# Patient Record
Sex: Female | Born: 1945
Health system: Southern US, Community
[De-identification: ages and names within clinical notes are randomized; demographics above are authoritative.]

## PROBLEM LIST (undated history)

## (undated) DIAGNOSIS — E119 Type 2 diabetes mellitus without complications: Secondary | ICD-10-CM

## (undated) DIAGNOSIS — M353 Polymyalgia rheumatica: Secondary | ICD-10-CM

## (undated) DIAGNOSIS — I2510 Atherosclerotic heart disease of native coronary artery without angina pectoris: Secondary | ICD-10-CM

## (undated) DIAGNOSIS — I771 Stricture of artery: Secondary | ICD-10-CM

## (undated) DIAGNOSIS — M1990 Unspecified osteoarthritis, unspecified site: Secondary | ICD-10-CM

## (undated) DIAGNOSIS — K219 Gastro-esophageal reflux disease without esophagitis: Secondary | ICD-10-CM

## (undated) DIAGNOSIS — I10 Essential (primary) hypertension: Secondary | ICD-10-CM

## (undated) DIAGNOSIS — E785 Hyperlipidemia, unspecified: Secondary | ICD-10-CM

## (undated) HISTORY — DX: Essential (primary) hypertension: I10

## (undated) HISTORY — PX: JOINT REPLACEMENT: SHX530

## (undated) HISTORY — DX: Atherosclerotic heart disease of native coronary artery without angina pectoris: I25.10

## (undated) HISTORY — DX: Stricture of artery: I77.1

## (undated) HISTORY — PX: REPLACEMENT TOTAL KNEE: SUR1224

## (undated) HISTORY — PX: BREAST SURGERY: SHX581

## (undated) HISTORY — DX: Hyperlipidemia, unspecified: E78.5

## (undated) HISTORY — DX: Polymyalgia rheumatica: M35.3

---

## 1968-11-27 HISTORY — PX: BREAST EXCISIONAL BIOPSY: SUR124

## 1968-11-27 HISTORY — PX: BREAST BIOPSY: SHX20

## 1971-11-28 HISTORY — PX: BREAST BIOPSY: SHX20

## 1971-11-28 HISTORY — PX: BREAST EXCISIONAL BIOPSY: SUR124

## 1981-11-27 HISTORY — PX: VAGINAL HYSTERECTOMY: SUR661

## 2006-09-17 ENCOUNTER — Ambulatory Visit (HOSPITAL_COMMUNITY): Admission: RE | Admit: 2006-09-17 | Discharge: 2006-09-18 | Payer: Self-pay | Admitting: Cardiology

## 2006-09-17 HISTORY — PX: CORONARY ANGIOPLASTY WITH STENT PLACEMENT: SHX49

## 2006-09-27 HISTORY — PX: TRANSTHORACIC ECHOCARDIOGRAM: SHX275

## 2006-10-27 HISTORY — PX: OTHER SURGICAL HISTORY: SHX169

## 2008-08-24 ENCOUNTER — Ambulatory Visit: Payer: Self-pay

## 2008-08-25 ENCOUNTER — Ambulatory Visit (HOSPITAL_COMMUNITY): Admission: RE | Admit: 2008-08-25 | Discharge: 2008-08-25 | Payer: Self-pay | Admitting: Cardiovascular Disease

## 2008-08-25 HISTORY — PX: CARDIAC CATHETERIZATION: SHX172

## 2008-12-17 ENCOUNTER — Ambulatory Visit: Payer: Self-pay | Admitting: Gastroenterology

## 2010-12-29 ENCOUNTER — Ambulatory Visit: Payer: Self-pay | Admitting: Family Medicine

## 2011-03-13 ENCOUNTER — Ambulatory Visit: Payer: Self-pay | Admitting: Family Medicine

## 2011-04-11 NOTE — Cardiovascular Report (Signed)
[name], [name] NO.:  [id number]   MEDICAL RECORD NO.:  [id number]          PATIENT TYPE:  OIB   LOCATION:  2899                         FACILITY:  [facility]   PHYSICIAN:  Laura Rojas, M.D.     DATE OF BIRTH:  February 19, 1946   DATE OF PROCEDURE:  08/25/2008  DATE OF DISCHARGE:  08/25/2008                            CARDIAC CATHETERIZATION   INDICATIONS:  Laura Rojas is a very pleasant 65 year old female who  has known coronary artery disease as well as a history of diabetes  mellitus, hypertension, and hyperlipidemia.  In October 2007, she  underwent stenting of a proximal LAD and also her proximal right  coronary artery.  Initially, she had felt exceptionally well.  Recently,  she has noted some episodes of some mild chest tightness.  A nuclear  stress test was done, which showed a small area of moderate ischemia in  the apical inferolateral segment.  She is now referred for repeat  cardiac catheterization.   PROCEDURE:  After premedication with Valium 5 mg intravenously, the  patient was prepped and draped in usual fashion.  The right femoral  artery was punctured anteriorly and a 5-French sheath was inserted.  Diagnostic cardiac catheterization was done utilizing 5-French FL4  catheter.  With the 5-French right catheter, there was significant  catheter dampening of the pressure and consequently this was changed to  a 4-French no torque and ultimately a 4-French JR4 catheter.  Intracoronary nitroglycerin 200 mcg was also selectively administered  down the right coronary artery to further evaluate potential area of  possible spasm in the mid RCA beyond the stented segment.  The pigtail  catheter was used for biplane cine left ventriculography.  With the  patient's risk factors, distal aortography was also performed.  Hemostasis was obtained by direct manual pressure.  She tolerated the  procedure well.   HEMODYNAMIC DATA:  Central aortic pressure was  130/61.  Left ventricular  pressure was 130/15.   ANGIOGRAPHIC DATA:  Left main coronary artery was an angiographically  normal vessel, which bifurcated into an LAD and left circumflex system.   The proximal LAD had mild luminal irregularity of 20% just prior to the  beginning of the stented segment.  The stented segment in an LV stent  was a long stent that covered the takeoff of 2 diagonal vessels.  There  was diffuse smooth narrowing of the ostial proximal portion of the first  diagonal vessel within the stented segment, but the vessel was small  caliber.  There was no in-stent restenosis.  The apical portion of the  LAD had a 95% stenosis prior to branching into the apical inferior and  inferolateral segment.   The circumflex vessel was a moderate size vessel that gave rise to a  large first marginal vessel and several small distal branches, which  also supplied the posterolateral wall.   The right coronary artery had 40% ostial narrowing.  The stented segment  was widely patent.  Initially, there was 40% narrowing in the mid RCA  beyond the stented segment, which improved to 20% following IC  nitroglycerin.  The RCA supplied a small-to-moderate-sized PDA system  and a very small diminutive PLA system.   Biplane cine left ventriculography revealed normal LV contractility  without focal segmental wall motion abnormalities.  Distal aortography  did not demonstrate any evidence for renal artery stenosis.  There was  no significant aortoiliac disease.   IMPRESSION:  1. Normal left ventricular function.  2. Widely patent stent in the proximal to mid left anterior descending      with 20% narrowing proximal to the stented segment, diffuse 60%      narrowing in the small first diagonal branch, and 95% apical left      anterior descending stenosis.  3. Normal left circumflex coronary artery.  4. Widely patent stent in the right coronary artery with 40% ostial      right coronary  artery narrowing and 30-40% mid right coronary      artery narrowing and slightly improved with intracoronary      nitroglycerin administration.  5. No significant aortoiliac disease.   DISCUSSION:  Laura Rojas's recent nuclear study suggests ischemia in the  apical/very distal inferolateral segment.  Based on her anatomy, this  most likely is due to the 95% apical left anterior descending stenosis  in a very small-caliber vessel.  Increased medical therapy will be  recommended.           ______________________________  Laura Rojas, M.D.     TK/MEDQ  D:  08/25/2008  T:  08/25/2008  Job:  [id number]   cc:   Dani Gobble, MD  Steele Sizer, MD  Elvina Sidle Noralyn Pick, NP

## 2011-04-14 NOTE — Cardiovascular Report (Signed)
NAMEEDITH, GROLEAU                ACCOUNT NO.:  [id number]   MEDICAL RECORD NO.:  [id number]          PATIENT TYPE:  OIB   LOCATION:  6531                         FACILITY:  [facility]   PHYSICIAN:  Vonna Kotyk R. Jacinto Halim, MD       DATE OF BIRTH:  1946/11/12   DATE OF PROCEDURE:  DATE OF DISCHARGE:                              CARDIAC CATHETERIZATION   REFERRING PHYSICIAN:  Leim Fabry, MD   PROCEDURES PERFORMED:  1. PTCA and stenting of right coronary artery.  2. PTCA and stenting of the left anterior descending artery.   INDICATION:  Ms. Myha Arizpe is a 65 year old female with a history of  hypertension and hyperlipidemia who has had an abnormal stress test.  She  has been having chronic angina pectoris.  She underwent diagnostic cardiac  catheterization at the [facility] three days ago and this had  revealed a completely occluded right coronary artery and a high-grade  stenosis of the proximal and mid-LAD.  During this, she was brought to  cardiac catheterization lab for angioplasty to her right and also to her  LAD.  The LAD gave collateral to the right coronary artery.  This was a  short segment occlusion.   Angiographic data right coronary artery:  Right coronary artery is a large  caliber vessel proximally.  The ostium has a 40% stenosis.  Mid-segment is  completely occluded.  The distal RCA is supplied by collaterals from the LAD  and slowly fills up all the way back up to the occlusion site.   Intervention data:  Successful PTCA and stenting of the mid-segment of the  right coronary artery with a 3.0 x 28 mm CYPHER-deployed 18 atmosphere  pressure.  Overall, the stenosis has reduced from 100% to -10%.  Brisk TIMI-  3 flow was noted to the right coronary artery.  The ostial 40-to-50%  stenosis was left alone, as this had not significantly changed from pre-  angioplasty.   Left main:  Left main is a large caliber vessel.  It is smooth and normal.   Circumflex:   Circumflex is a large caliber vessel and is normal.   LAD:  LAD is a large caliber vessel, it gives rise to large diagonal one and  a moderate sized D2. There is a long segment 80-to-90% stenosis in the  proximal segment and a 90% calcific stenosis in the mid-segment of the LAD.   Intervention data:  Successful PTCA and stenting of the LAD with a 2.5 x 33  mm CYPHER deployed at 12 atmosphere pressure.  This stent was post dilated  with a 3.5 x 15 mm Quantum at 12 atmosphere pressure x3.  Transiently,  hyperinflation at 18 atmosphere pressure was performed to break the waste in  the proximal LAD.  Overall, the stenosis was reduced from 90% to 0% with  TIMI-3 to TMI-3 flow maintained at the end the procedure.   A total of 325 cc of contrast was utilized for interventional procedure.   RECOMMENDATIONS:  The patient will  be continued on long-term Plavix.  She  needs continued risk  factor modification.  If stable, she will be discharged  home in the morning with followup with Dr.Aldridge at [facility] office.   A double-guide catheter technique was utilized engaging the left main and  also the right coronary artery with a FL-4 and FR-4 guide.  Simultaneous  injections were performed to utilize the right coronary artery.   Technique of the procedure:  Under usual sterile precautions using a 7-  French right femoral and a 7-French left femoral arterial access and using  heparin and integrellin  for anticoagulation, an FR-4 guide with side hole  was utilized to engage right coronary artery.  Angiography was performed.  Then using as a 7-French Judkins leftward for guide catheter, the left main  coronary artery was selected and angiography is repeated.  Then using  initially a cross it 200 guidewire and then a Whisper wire and eventually a  Miracle Brothers-12 guidewire, I was able to cross the occluded right  coronary artery with the help of 2.0 x 15 mm Maverick balloon support.  Once  the  guidewire was positioned in place because of inability to advance the  2.0 balloon, a 1.5 x 15 mm old wire Maverick balloon was utilized and  multiple inflations at the occluded segment was performed at 10 and 12  atmosphere pressure from 45 to 50 seconds.  Then a 2.0 x 15 mm Maverick  balloon was advanced to the occluded segment and balloon angioplasty was  performed at 12 atmosphere pressure for 60 seconds.  During the procedure,  200 mcg of  intracoronary nitroglycerin was administered.  Patient was  extremely sensitive, immediately dropped her pressures down to 60 to 70 mm  systolic.  Hence, dopamine had to be used very transiently to bring up the  pressures along with administration of 1 mg of atropine.   Once I opened the right coronary artery, a huge vessel was visualized.  Then  I used a 3.0 x 28 mm CYPHER stent and carefully advancing the CYPHER stent  at the occluded segment and positioning the CYPHER stent into proximal  segment of right coronary artery which also had lesions, the stent was  deployed at 69.6 pressure for 60 seconds and then at 18 atmosphere pressure  for 33 seconds.  Post stent deployment, there was outflow spasm noted and  this was gently relieved with advancing the same stent balloon and inflating  it at two atmosphere pressure for 60 seconds.  Again, I noted a distal spasm  which was then relieved with using a 2.0 x 15 mm Maverick at four atmosphere  pressure for 36 seconds.  100 mcg of intracoronary nitroglycerin was  administered.  Overall, angiographic success was confirmed in multiple  views.  Then the guidewire was withdrawn, angiography repeated.  Guide  catheter disengaged and pulled out of body in usual fashion.  During the  procedure, left coronary injections were performed simultaneously to  visualize the distal right coronary artery.   Technique of intervention to the LAD:  Then I used 190 cm x 0.014 inch ATW guidewire to advance into the LAD.   The lesion length was carefully  measured.  I initially used a 3.0 x 28 mm CYPHER stent balloon to advance  into the LAD, but because of inability to do so, I used a 3.0 x 30 mm  Maverick and the LAD angioplasty was performed at 12 and 10 atmosphere  pressure through the mid and proximal segment.  There was significant waste  noted in  the proximal segment which I was unable to open up even for  transiently higher pressure inflations.  At this time, I pulled the balloon  out and a 3.5 x 33 mm CYPHER stent was advanced into the LAD.  With great  difficulty and using a 300 cm x 0.014 inch Cougar wire for a buddy wire  system, the guide was deep throated and the stent was then advanced to the  site of the stenosis.  After confirming the position of the stent, the stent  was deployed at 10 atmosphere pressure for 37 seconds and 8 atmosphere  pressure for 45 seconds.  Then the stents were withdrawn out of the body and  a 3.5 x 15 mm Quantum balloon was advanced into the LAD and keeping the  balloon within the stent struts, a 12 atmosphere pressure x3 inflations were  performed throughout the entirety of the stent.  The mid-LAD had a very  tight waste and that was relieved with transient inflation of the balloon to  around 18 atmosphere pressure.  Post balloon angioplasty, 200 mcg of  intracoronary nitroglycerin was administered, angiography was  repeated.  Excellent results were noted.  After confirming the success of  the results, the guidewire was withdrawn, angiography repeated, guide  catheter pulled out.  Overall, patient tolerated the procedure.  No  immediate complications were noted.      Cristy Hilts. Jacinto Halim, MD  Electronically Signed     JRG/MEDQ  D:  09/17/2006  T:  09/17/2006  Job:  [id number]

## 2011-08-28 LAB — GLUCOSE, CAPILLARY: Glucose-Capillary: 207 — ABNORMAL HIGH

## 2011-11-28 HISTORY — PX: NM MYOCAR MULTIPLE W/SPECT: HXRAD626

## 2011-11-29 DIAGNOSIS — Z9861 Coronary angioplasty status: Secondary | ICD-10-CM | POA: Diagnosis not present

## 2011-11-29 DIAGNOSIS — I2510 Atherosclerotic heart disease of native coronary artery without angina pectoris: Secondary | ICD-10-CM | POA: Diagnosis not present

## 2011-11-29 DIAGNOSIS — I10 Essential (primary) hypertension: Secondary | ICD-10-CM | POA: Diagnosis not present

## 2011-11-29 DIAGNOSIS — R9431 Abnormal electrocardiogram [ECG] [EKG]: Secondary | ICD-10-CM | POA: Diagnosis not present

## 2011-12-13 DIAGNOSIS — H4010X0 Unspecified open-angle glaucoma, stage unspecified: Secondary | ICD-10-CM | POA: Diagnosis not present

## 2012-02-05 DIAGNOSIS — M25569 Pain in unspecified knee: Secondary | ICD-10-CM | POA: Diagnosis not present

## 2012-02-05 DIAGNOSIS — M1710 Unilateral primary osteoarthritis, unspecified knee: Secondary | ICD-10-CM | POA: Diagnosis not present

## 2012-02-28 DIAGNOSIS — E119 Type 2 diabetes mellitus without complications: Secondary | ICD-10-CM | POA: Diagnosis not present

## 2012-04-10 DIAGNOSIS — E785 Hyperlipidemia, unspecified: Secondary | ICD-10-CM | POA: Diagnosis not present

## 2012-04-10 DIAGNOSIS — Z0000 Encounter for general adult medical examination without abnormal findings: Secondary | ICD-10-CM | POA: Diagnosis not present

## 2012-04-10 DIAGNOSIS — F329 Major depressive disorder, single episode, unspecified: Secondary | ICD-10-CM | POA: Diagnosis not present

## 2012-04-10 DIAGNOSIS — Z1289 Encounter for screening for malignant neoplasm of other sites: Secondary | ICD-10-CM | POA: Diagnosis not present

## 2012-04-10 DIAGNOSIS — E119 Type 2 diabetes mellitus without complications: Secondary | ICD-10-CM | POA: Diagnosis not present

## 2012-04-10 DIAGNOSIS — M949 Disorder of cartilage, unspecified: Secondary | ICD-10-CM | POA: Diagnosis not present

## 2012-04-10 DIAGNOSIS — I10 Essential (primary) hypertension: Secondary | ICD-10-CM | POA: Diagnosis not present

## 2012-05-20 ENCOUNTER — Ambulatory Visit: Payer: Self-pay | Admitting: Family Medicine

## 2012-05-20 DIAGNOSIS — Z1231 Encounter for screening mammogram for malignant neoplasm of breast: Secondary | ICD-10-CM | POA: Diagnosis not present

## 2012-06-12 DIAGNOSIS — H4010X0 Unspecified open-angle glaucoma, stage unspecified: Secondary | ICD-10-CM | POA: Diagnosis not present

## 2012-06-25 DIAGNOSIS — I10 Essential (primary) hypertension: Secondary | ICD-10-CM | POA: Diagnosis not present

## 2012-06-25 DIAGNOSIS — Z794 Long term (current) use of insulin: Secondary | ICD-10-CM | POA: Diagnosis not present

## 2012-06-25 DIAGNOSIS — E7800 Pure hypercholesterolemia, unspecified: Secondary | ICD-10-CM | POA: Diagnosis not present

## 2012-07-03 DIAGNOSIS — R319 Hematuria, unspecified: Secondary | ICD-10-CM | POA: Diagnosis not present

## 2012-07-03 DIAGNOSIS — E119 Type 2 diabetes mellitus without complications: Secondary | ICD-10-CM | POA: Diagnosis not present

## 2012-07-05 DIAGNOSIS — N390 Urinary tract infection, site not specified: Secondary | ICD-10-CM | POA: Diagnosis not present

## 2012-07-05 DIAGNOSIS — R319 Hematuria, unspecified: Secondary | ICD-10-CM | POA: Diagnosis not present

## 2012-07-10 DIAGNOSIS — R319 Hematuria, unspecified: Secondary | ICD-10-CM | POA: Diagnosis not present

## 2012-07-10 DIAGNOSIS — I10 Essential (primary) hypertension: Secondary | ICD-10-CM | POA: Diagnosis not present

## 2012-09-18 DIAGNOSIS — Z23 Encounter for immunization: Secondary | ICD-10-CM | POA: Diagnosis not present

## 2012-10-14 DIAGNOSIS — E119 Type 2 diabetes mellitus without complications: Secondary | ICD-10-CM | POA: Diagnosis not present

## 2012-10-14 DIAGNOSIS — E785 Hyperlipidemia, unspecified: Secondary | ICD-10-CM | POA: Diagnosis not present

## 2012-10-14 DIAGNOSIS — I10 Essential (primary) hypertension: Secondary | ICD-10-CM | POA: Diagnosis not present

## 2012-12-10 DIAGNOSIS — I10 Essential (primary) hypertension: Secondary | ICD-10-CM | POA: Diagnosis not present

## 2012-12-10 DIAGNOSIS — E7800 Pure hypercholesterolemia, unspecified: Secondary | ICD-10-CM | POA: Diagnosis not present

## 2012-12-11 DIAGNOSIS — H4010X0 Unspecified open-angle glaucoma, stage unspecified: Secondary | ICD-10-CM | POA: Diagnosis not present

## 2012-12-12 DIAGNOSIS — E119 Type 2 diabetes mellitus without complications: Secondary | ICD-10-CM | POA: Diagnosis not present

## 2012-12-12 DIAGNOSIS — I10 Essential (primary) hypertension: Secondary | ICD-10-CM | POA: Diagnosis not present

## 2012-12-12 DIAGNOSIS — I2510 Atherosclerotic heart disease of native coronary artery without angina pectoris: Secondary | ICD-10-CM | POA: Diagnosis not present

## 2013-01-21 DIAGNOSIS — E162 Hypoglycemia, unspecified: Secondary | ICD-10-CM | POA: Diagnosis not present

## 2013-01-21 DIAGNOSIS — Z794 Long term (current) use of insulin: Secondary | ICD-10-CM | POA: Diagnosis not present

## 2013-02-10 DIAGNOSIS — L259 Unspecified contact dermatitis, unspecified cause: Secondary | ICD-10-CM | POA: Diagnosis not present

## 2013-02-10 DIAGNOSIS — I10 Essential (primary) hypertension: Secondary | ICD-10-CM | POA: Diagnosis not present

## 2013-04-16 DIAGNOSIS — E785 Hyperlipidemia, unspecified: Secondary | ICD-10-CM | POA: Diagnosis not present

## 2013-04-16 DIAGNOSIS — E119 Type 2 diabetes mellitus without complications: Secondary | ICD-10-CM | POA: Diagnosis not present

## 2013-04-16 DIAGNOSIS — Z0000 Encounter for general adult medical examination without abnormal findings: Secondary | ICD-10-CM | POA: Diagnosis not present

## 2013-04-16 DIAGNOSIS — I2510 Atherosclerotic heart disease of native coronary artery without angina pectoris: Secondary | ICD-10-CM | POA: Diagnosis not present

## 2013-04-16 DIAGNOSIS — I10 Essential (primary) hypertension: Secondary | ICD-10-CM | POA: Diagnosis not present

## 2013-05-21 ENCOUNTER — Ambulatory Visit: Payer: Self-pay | Admitting: Family Medicine

## 2013-05-21 DIAGNOSIS — Z1231 Encounter for screening mammogram for malignant neoplasm of breast: Secondary | ICD-10-CM | POA: Diagnosis not present

## 2013-06-11 DIAGNOSIS — H4010X0 Unspecified open-angle glaucoma, stage unspecified: Secondary | ICD-10-CM | POA: Diagnosis not present

## 2013-07-01 DIAGNOSIS — E7800 Pure hypercholesterolemia, unspecified: Secondary | ICD-10-CM | POA: Diagnosis not present

## 2013-07-01 DIAGNOSIS — I10 Essential (primary) hypertension: Secondary | ICD-10-CM | POA: Diagnosis not present

## 2013-09-08 DIAGNOSIS — Z23 Encounter for immunization: Secondary | ICD-10-CM | POA: Diagnosis not present

## 2013-10-16 DIAGNOSIS — E785 Hyperlipidemia, unspecified: Secondary | ICD-10-CM | POA: Diagnosis not present

## 2013-10-16 DIAGNOSIS — I10 Essential (primary) hypertension: Secondary | ICD-10-CM | POA: Diagnosis not present

## 2013-10-16 DIAGNOSIS — E119 Type 2 diabetes mellitus without complications: Secondary | ICD-10-CM | POA: Diagnosis not present

## 2013-12-17 DIAGNOSIS — H4010X0 Unspecified open-angle glaucoma, stage unspecified: Secondary | ICD-10-CM | POA: Diagnosis not present

## 2013-12-22 DIAGNOSIS — IMO0001 Reserved for inherently not codable concepts without codable children: Secondary | ICD-10-CM | POA: Diagnosis not present

## 2013-12-30 DIAGNOSIS — IMO0001 Reserved for inherently not codable concepts without codable children: Secondary | ICD-10-CM | POA: Diagnosis not present

## 2013-12-30 DIAGNOSIS — I10 Essential (primary) hypertension: Secondary | ICD-10-CM | POA: Diagnosis not present

## 2013-12-30 DIAGNOSIS — E7800 Pure hypercholesterolemia, unspecified: Secondary | ICD-10-CM | POA: Diagnosis not present

## 2013-12-31 ENCOUNTER — Encounter: Payer: Self-pay | Admitting: Cardiovascular Disease

## 2013-12-31 ENCOUNTER — Ambulatory Visit (INDEPENDENT_AMBULATORY_CARE_PROVIDER_SITE_OTHER): Payer: Medicare Other | Admitting: Cardiovascular Disease

## 2013-12-31 VITALS — BP 110/64 | HR 54 | Resp 16 | Ht 65.0 in | Wt 164.0 lb

## 2013-12-31 DIAGNOSIS — E785 Hyperlipidemia, unspecified: Secondary | ICD-10-CM | POA: Diagnosis not present

## 2013-12-31 DIAGNOSIS — E1159 Type 2 diabetes mellitus with other circulatory complications: Secondary | ICD-10-CM | POA: Insufficient documentation

## 2013-12-31 DIAGNOSIS — I10 Essential (primary) hypertension: Secondary | ICD-10-CM | POA: Insufficient documentation

## 2013-12-31 DIAGNOSIS — E118 Type 2 diabetes mellitus with unspecified complications: Secondary | ICD-10-CM | POA: Insufficient documentation

## 2013-12-31 DIAGNOSIS — I2510 Atherosclerotic heart disease of native coronary artery without angina pectoris: Secondary | ICD-10-CM | POA: Diagnosis not present

## 2013-12-31 DIAGNOSIS — E1169 Type 2 diabetes mellitus with other specified complication: Secondary | ICD-10-CM | POA: Insufficient documentation

## 2013-12-31 HISTORY — DX: Atherosclerotic heart disease of native coronary artery without angina pectoris: I25.10

## 2013-12-31 MED ORDER — HYDROCHLOROTHIAZIDE 12.5 MG PO CAPS: 12.5000 mg | ORAL_CAPSULE | Freq: Every day | ORAL | Status: DC

## 2013-12-31 NOTE — Progress Notes (Signed)
Patient ID: Analise Domin, female   DOB: 19-Dec-1945, 68 y.o.   MRN: [id number]      Reason for office visit Followup CAD  Mrs. Merrilee Jansky is a 68 year old woman with coronary disease and previous percutaneous revascularization. She had stents placed in the proximal LAD and proximal RCA (2007, 3.0 x 28 mm drug-eluting Cypher stent to RCA, 3.5 x 33 mm drug-eluting Cypher stent and proximal LAD). She is known to have an untreated 25% stenosis in the very distal portion of the LAD and a 60% "jailed" ostial diagonal stenosis at the level of the proximal LAD stent. She has preserved LV systolic function and has never had congestive heart failure. Her most recent functional study was a nuclear stress test in 2013 that showed normal perfusion and normal LVEF. Her most recent cardiac catheterization was performed in 2009 in both the LAD and RCA stents were found to be widely patent.  Significant comorbidities include diabetes mellitus type 2, hyperlipidemia and hypertension although aggressive pharmacological therapy.  She is feeling better than last year. At that time she was recovering from several deaths that occurred in her immediate family. She is now taking care of both of her own household and that over 2 year old father who wants to continue living independently. She is not limited by dyspnea or angina. Denies syncope but does have frequent dizziness when she bends over and stands up. Her last hemoglobin A1c was suboptimal at 8.2%. Her endocrinologist is Dr. Renae Fickle in [place]. Her most recent lipid profile was quite favorable. Her blood pressure control is excellent.   Allergies  Allergen Reactions  . Compazine [Prochlorperazine Edisylate]     choking  . Reglan [Metoclopramide]   . Sulfa Antibiotics     headache  . Latex Rash    Current Outpatient Prescriptions  Medication Sig Dispense Refill  . aspirin 81 MG tablet Take 81 mg by mouth daily.      . carvedilol (COREG) 25 MG tablet Take 12.5  mg by mouth 2 (two) times daily.      . clopidogrel (PLAVIX) 75 MG tablet Take 75 mg by mouth daily.      . insulin aspart (NOVOLOG) 100 UNIT/ML injection Inject 6 Units into the skin 3 (three) times daily before meals.      Marland Kitchen LANTUS 100 UNIT/ML injection Inject 18 Units into the skin every morning.      . latanoprost (XALATAN) 0.005 % ophthalmic solution Place 1 drop into both eyes daily.      Marland Kitchen LORazepam (ATIVAN) 1 MG tablet Take 1 mg by mouth daily.      Marland Kitchen losartan (COZAAR) 100 MG tablet Take 100 mg by mouth daily.      . magnesium 30 MG tablet Take 30 mg by mouth daily.      . metFORMIN (GLUCOPHAGE) 1000 MG tablet Take 1,000 mg by mouth 2 (two) times daily.      . Omega-3 Fatty Acids (FISH OIL) 1200 MG CAPS Take 1,200 mg by mouth daily.      . pantoprazole (PROTONIX) 40 MG tablet Take 40 mg by mouth daily.      Marland Kitchen PARoxetine (PAXIL) 20 MG tablet Take 20 mg by mouth daily.      . simvastatin (ZOCOR) 40 MG tablet Take 40 mg by mouth daily.      . hydrochlorothiazide (MICROZIDE) 12.5 MG capsule Take 1 capsule (12.5 mg total) by mouth daily.  90 capsule  3   No current facility-administered medications for this visit.  Past Medical History  Diagnosis Date  . CAD (coronary artery disease)   . DM (diabetes mellitus)   . Dyslipidemia   . Hypertension     Past Surgical History  Procedure Laterality Date  . Replacement total knee  2003 & 2004  . Vaginal hysterectomy  1983  . Breast biopsy  1970    right  . Breast biopsy  1973    left  . Coronary angioplasty with stent placement  09/17/2006    PTCA & stenting LAD  . Cardiac catheterization  08/25/2008    patent stents    Family History  Problem Relation Age of Onset  . Cancer Mother   . Diabetes Sister   . Cancer Sister     History   Social History  . Marital Status: Married    Spouse Name: N/A    Number of Children: N/A  . Years of Education: N/A   Occupational History  . Not on file.   Social History Main Topics    . Smoking status: Never Smoker   . Smokeless tobacco: Not on file  . Alcohol Use: No  . Drug Use: No  . Sexual Activity: Not on file   Other Topics Concern  . Not on file   Social History Narrative  . No narrative on file    Review of systems: The patient specifically denies any chest pain at rest or with exertion, dyspnea at rest or with exertion, orthopnea, paroxysmal nocturnal dyspnea, syncope, palpitations, focal neurological deficits, intermittent claudication, lower extremity edema, unexplained weight gain, cough, hemoptysis or wheezing.  The patient also denies abdominal pain, nausea, vomiting, dysphagia, diarrhea, constipation, polyuria, polydipsia, dysuria, hematuria, frequency, urgency, abnormal bleeding or bruising, fever, chills, unexpected weight changes, mood swings, change in skin or hair texture, change in voice quality, auditory or visual problems, allergic reactions or rashes, new musculoskeletal complaints other than usual "aches and pains". She describes orthostatic dizziness  PHYSICAL EXAM BP 110/64  Pulse 54  Resp 16  Ht 5\' 5"  (1.651 m)  Wt 74.39 kg (164 lb)  BMI 27.29 kg/m2  General: Alert, oriented x3, no distress Head: no evidence of trauma, PERRL, EOMI, no exophtalmos or lid lag, no myxedema, no xanthelasma; normal ears, nose and oropharynx Neck: normal jugular venous pulsations and no hepatojugular reflux; brisk carotid pulses without delay and no carotid bruits Chest: clear to auscultation, no signs of consolidation by percussion or palpation, normal fremitus, symmetrical and full respiratory excursions Cardiovascular: normal position and quality of the apical impulse, regular rhythm, normal first and second heart sounds, no murmurs, rubs or gallops Abdomen: no tenderness or distention, no masses by palpation, no abnormal pulsatility or arterial bruits, normal bowel sounds, no hepatosplenomegaly Extremities: no clubbing, cyanosis or edema; 2+ radial,  ulnar and brachial pulses bilaterally; 2+ right femoral, posterior tibial and dorsalis pedis pulses; 2+ left femoral, posterior tibial and dorsalis pedis pulses; no subclavian or femoral bruits Neurological: grossly nonfocal   EKG: Sinus bradycardia, chronic ST segment depression and inverted T waves across the entire anterior precordium as well as in the inferior limb leads. This is not changed from previous tracings. QTC 424 ms  Lipid Panel labs were performed in November 2014 Total cholesterol 135, HDL 40, triglycerides 207, LDL 54 Creatinine 1.1 (GFR 52) hemoglobin 12.2, normal liver function tests, normal TSH    ASSESSMENT AND PLAN  It has been 8 years since Mrs. Fowler underwent percutaneous revascularization of 2 coronary vessels and she has not had adverse coronary  events since. Both her stents are drug-eluting devices. With the exception of her diabetes, her risk factors are well controlled. Her hemoglobin A1c has actually improved from last year. The lack of ideal glycemic control is also likely responsible for her elevated triglycerides. Her blood pressure control may be too good. I have recommended that she decrease her hydrochlorothiazide to 12.5 mg daily as this may help with her orthostatic dizziness. She'll followup in a year  Orders Placed This Encounter  Procedures  . EKG 12-Lead   Meds ordered this encounter  Medications  . carvedilol (COREG) 25 MG tablet    Sig: Take 12.5 mg by mouth 2 (two) times daily.  . clopidogrel (PLAVIX) 75 MG tablet    Sig: Take 75 mg by mouth daily.  Marland Kitchen DISCONTD: hydrochlorothiazide (HYDRODIURIL) 25 MG tablet    Sig: Take 25 mg by mouth daily.  Marland Kitchen LANTUS 100 UNIT/ML injection    Sig: Inject 18 Units into the skin every morning.  . latanoprost (XALATAN) 0.005 % ophthalmic solution    Sig: Place 1 drop into both eyes daily.  Marland Kitchen LORazepam (ATIVAN) 1 MG tablet    Sig: Take 1 mg by mouth daily.  Marland Kitchen losartan (COZAAR) 100 MG tablet    Sig: Take 100  mg by mouth daily.  . metFORMIN (GLUCOPHAGE) 1000 MG tablet    Sig: Take 1,000 mg by mouth 2 (two) times daily.  . pantoprazole (PROTONIX) 40 MG tablet    Sig: Take 40 mg by mouth daily.  . simvastatin (ZOCOR) 40 MG tablet    Sig: Take 40 mg by mouth daily.  Marland Kitchen PARoxetine (PAXIL) 20 MG tablet    Sig: Take 20 mg by mouth daily.  . insulin aspart (NOVOLOG) 100 UNIT/ML injection    Sig: Inject 6 Units into the skin 3 (three) times daily before meals.  Marland Kitchen aspirin 81 MG tablet    Sig: Take 81 mg by mouth daily.  . Omega-3 Fatty Acids (FISH OIL) 1200 MG CAPS    Sig: Take 1,200 mg by mouth daily.  . magnesium 30 MG tablet    Sig: Take 30 mg by mouth daily.  . hydrochlorothiazide (MICROZIDE) 12.5 MG capsule    Sig: Take 1 capsule (12.5 mg total) by mouth daily.    Dispense:  90 capsule    Refill:  3    Eliyohu Class  Thurmon Fair, MD, [facility] [phone] office [phone] pager

## 2013-12-31 NOTE — Patient Instructions (Signed)
DECREASE HCTZ  (HYDROCHLORATHIZIDE) TO 12.5 MG ONCE A DAY  Your physician wants you to follow-up in [place]. You will receive a reminder letter in the mail two months in advance. If you don't receive a letter, please call our office to schedule the follow-up appointment.

## 2014-03-11 DIAGNOSIS — M259 Joint disorder, unspecified: Secondary | ICD-10-CM | POA: Diagnosis not present

## 2014-03-11 DIAGNOSIS — M109 Gout, unspecified: Secondary | ICD-10-CM | POA: Diagnosis not present

## 2014-04-06 DIAGNOSIS — N039 Chronic nephritic syndrome with unspecified morphologic changes: Secondary | ICD-10-CM | POA: Diagnosis not present

## 2014-04-06 DIAGNOSIS — N183 Chronic kidney disease, stage 3 unspecified: Secondary | ICD-10-CM | POA: Diagnosis not present

## 2014-04-06 DIAGNOSIS — E119 Type 2 diabetes mellitus without complications: Secondary | ICD-10-CM | POA: Diagnosis not present

## 2014-04-06 DIAGNOSIS — N2581 Secondary hyperparathyroidism of renal origin: Secondary | ICD-10-CM | POA: Diagnosis not present

## 2014-04-06 DIAGNOSIS — D631 Anemia in chronic kidney disease: Secondary | ICD-10-CM | POA: Diagnosis not present

## 2014-04-06 DIAGNOSIS — R809 Proteinuria, unspecified: Secondary | ICD-10-CM | POA: Diagnosis not present

## 2014-04-06 DIAGNOSIS — I10 Essential (primary) hypertension: Secondary | ICD-10-CM | POA: Diagnosis not present

## 2014-04-22 DIAGNOSIS — IMO0001 Reserved for inherently not codable concepts without codable children: Secondary | ICD-10-CM | POA: Diagnosis not present

## 2014-04-22 DIAGNOSIS — Z0000 Encounter for general adult medical examination without abnormal findings: Secondary | ICD-10-CM | POA: Diagnosis not present

## 2014-04-22 DIAGNOSIS — E119 Type 2 diabetes mellitus without complications: Secondary | ICD-10-CM | POA: Diagnosis not present

## 2014-04-22 DIAGNOSIS — E785 Hyperlipidemia, unspecified: Secondary | ICD-10-CM | POA: Diagnosis not present

## 2014-04-22 DIAGNOSIS — I10 Essential (primary) hypertension: Secondary | ICD-10-CM | POA: Diagnosis not present

## 2014-04-22 DIAGNOSIS — N183 Chronic kidney disease, stage 3 unspecified: Secondary | ICD-10-CM | POA: Diagnosis not present

## 2014-04-22 DIAGNOSIS — Z23 Encounter for immunization: Secondary | ICD-10-CM | POA: Diagnosis not present

## 2014-04-22 DIAGNOSIS — I2510 Atherosclerotic heart disease of native coronary artery without angina pectoris: Secondary | ICD-10-CM | POA: Diagnosis not present

## 2014-04-23 DIAGNOSIS — N183 Chronic kidney disease, stage 3 unspecified: Secondary | ICD-10-CM | POA: Diagnosis not present

## 2014-04-24 DIAGNOSIS — E1165 Type 2 diabetes mellitus with hyperglycemia: Secondary | ICD-10-CM | POA: Diagnosis not present

## 2014-04-24 DIAGNOSIS — I129 Hypertensive chronic kidney disease with stage 1 through stage 4 chronic kidney disease, or unspecified chronic kidney disease: Secondary | ICD-10-CM | POA: Diagnosis not present

## 2014-04-24 DIAGNOSIS — N183 Chronic kidney disease, stage 3 unspecified: Secondary | ICD-10-CM | POA: Diagnosis not present

## 2014-04-24 DIAGNOSIS — E1129 Type 2 diabetes mellitus with other diabetic kidney complication: Secondary | ICD-10-CM | POA: Diagnosis not present

## 2014-04-28 DIAGNOSIS — IMO0001 Reserved for inherently not codable concepts without codable children: Secondary | ICD-10-CM | POA: Diagnosis not present

## 2014-04-28 DIAGNOSIS — E785 Hyperlipidemia, unspecified: Secondary | ICD-10-CM | POA: Diagnosis not present

## 2014-04-28 DIAGNOSIS — I10 Essential (primary) hypertension: Secondary | ICD-10-CM | POA: Diagnosis not present

## 2014-05-04 DIAGNOSIS — N183 Chronic kidney disease, stage 3 unspecified: Secondary | ICD-10-CM | POA: Diagnosis not present

## 2014-05-04 DIAGNOSIS — N039 Chronic nephritic syndrome with unspecified morphologic changes: Secondary | ICD-10-CM | POA: Diagnosis not present

## 2014-05-04 DIAGNOSIS — I10 Essential (primary) hypertension: Secondary | ICD-10-CM | POA: Diagnosis not present

## 2014-05-04 DIAGNOSIS — E119 Type 2 diabetes mellitus without complications: Secondary | ICD-10-CM | POA: Diagnosis not present

## 2014-05-04 DIAGNOSIS — D631 Anemia in chronic kidney disease: Secondary | ICD-10-CM | POA: Diagnosis not present

## 2014-05-04 DIAGNOSIS — N2581 Secondary hyperparathyroidism of renal origin: Secondary | ICD-10-CM | POA: Diagnosis not present

## 2014-05-13 ENCOUNTER — Ambulatory Visit: Payer: Self-pay | Admitting: Family Medicine

## 2014-05-13 DIAGNOSIS — Z13820 Encounter for screening for osteoporosis: Secondary | ICD-10-CM | POA: Diagnosis not present

## 2014-05-13 DIAGNOSIS — N189 Chronic kidney disease, unspecified: Secondary | ICD-10-CM | POA: Diagnosis not present

## 2014-05-25 ENCOUNTER — Ambulatory Visit: Payer: Self-pay | Admitting: Family Medicine

## 2014-05-25 DIAGNOSIS — Z1231 Encounter for screening mammogram for malignant neoplasm of breast: Secondary | ICD-10-CM | POA: Diagnosis not present

## 2014-06-04 DIAGNOSIS — J029 Acute pharyngitis, unspecified: Secondary | ICD-10-CM | POA: Diagnosis not present

## 2014-06-04 DIAGNOSIS — J209 Acute bronchitis, unspecified: Secondary | ICD-10-CM | POA: Diagnosis not present

## 2014-06-29 DIAGNOSIS — J019 Acute sinusitis, unspecified: Secondary | ICD-10-CM | POA: Diagnosis not present

## 2014-07-22 DIAGNOSIS — E119 Type 2 diabetes mellitus without complications: Secondary | ICD-10-CM | POA: Diagnosis not present

## 2014-07-22 DIAGNOSIS — E538 Deficiency of other specified B group vitamins: Secondary | ICD-10-CM | POA: Diagnosis not present

## 2014-07-22 DIAGNOSIS — IMO0001 Reserved for inherently not codable concepts without codable children: Secondary | ICD-10-CM | POA: Diagnosis not present

## 2014-07-27 DIAGNOSIS — IMO0001 Reserved for inherently not codable concepts without codable children: Secondary | ICD-10-CM | POA: Diagnosis not present

## 2014-07-28 DIAGNOSIS — I10 Essential (primary) hypertension: Secondary | ICD-10-CM | POA: Diagnosis not present

## 2014-07-28 DIAGNOSIS — Z794 Long term (current) use of insulin: Secondary | ICD-10-CM | POA: Diagnosis not present

## 2014-07-28 DIAGNOSIS — E538 Deficiency of other specified B group vitamins: Secondary | ICD-10-CM | POA: Diagnosis not present

## 2014-07-28 DIAGNOSIS — IMO0001 Reserved for inherently not codable concepts without codable children: Secondary | ICD-10-CM | POA: Diagnosis not present

## 2014-09-02 ENCOUNTER — Ambulatory Visit: Payer: Self-pay

## 2014-09-02 DIAGNOSIS — R05 Cough: Secondary | ICD-10-CM | POA: Diagnosis not present

## 2014-09-02 DIAGNOSIS — E119 Type 2 diabetes mellitus without complications: Secondary | ICD-10-CM | POA: Diagnosis not present

## 2014-09-02 DIAGNOSIS — I10 Essential (primary) hypertension: Secondary | ICD-10-CM | POA: Diagnosis not present

## 2014-09-07 DIAGNOSIS — Z23 Encounter for immunization: Secondary | ICD-10-CM | POA: Diagnosis not present

## 2014-09-14 DIAGNOSIS — R05 Cough: Secondary | ICD-10-CM | POA: Diagnosis not present

## 2014-09-14 DIAGNOSIS — J309 Allergic rhinitis, unspecified: Secondary | ICD-10-CM | POA: Diagnosis not present

## 2014-09-21 DIAGNOSIS — R05 Cough: Secondary | ICD-10-CM | POA: Diagnosis not present

## 2014-10-19 DIAGNOSIS — J309 Allergic rhinitis, unspecified: Secondary | ICD-10-CM | POA: Diagnosis not present

## 2014-10-19 DIAGNOSIS — I10 Essential (primary) hypertension: Secondary | ICD-10-CM | POA: Diagnosis not present

## 2014-10-19 DIAGNOSIS — E119 Type 2 diabetes mellitus without complications: Secondary | ICD-10-CM | POA: Diagnosis not present

## 2014-10-19 DIAGNOSIS — E785 Hyperlipidemia, unspecified: Secondary | ICD-10-CM | POA: Diagnosis not present

## 2014-10-19 DIAGNOSIS — I2510 Atherosclerotic heart disease of native coronary artery without angina pectoris: Secondary | ICD-10-CM | POA: Diagnosis not present

## 2014-10-19 DIAGNOSIS — N183 Chronic kidney disease, stage 3 (moderate): Secondary | ICD-10-CM | POA: Diagnosis not present

## 2014-10-19 DIAGNOSIS — M8580 Other specified disorders of bone density and structure, unspecified site: Secondary | ICD-10-CM | POA: Diagnosis not present

## 2014-10-19 DIAGNOSIS — F339 Major depressive disorder, recurrent, unspecified: Secondary | ICD-10-CM | POA: Diagnosis not present

## 2014-10-28 DIAGNOSIS — E1165 Type 2 diabetes mellitus with hyperglycemia: Secondary | ICD-10-CM | POA: Diagnosis not present

## 2014-10-28 DIAGNOSIS — E538 Deficiency of other specified B group vitamins: Secondary | ICD-10-CM | POA: Diagnosis not present

## 2014-11-03 DIAGNOSIS — Z794 Long term (current) use of insulin: Secondary | ICD-10-CM | POA: Diagnosis not present

## 2014-11-03 DIAGNOSIS — E1165 Type 2 diabetes mellitus with hyperglycemia: Secondary | ICD-10-CM | POA: Diagnosis not present

## 2014-11-03 DIAGNOSIS — E538 Deficiency of other specified B group vitamins: Secondary | ICD-10-CM | POA: Diagnosis not present

## 2014-11-03 DIAGNOSIS — I10 Essential (primary) hypertension: Secondary | ICD-10-CM | POA: Diagnosis not present

## 2014-12-21 IMAGING — MG MM DIGITAL SCREENING BILAT W/ CAD
1 series · 5 of 5 positions shown · non-contrast
Comparison: Previous exam(s).

CLINICAL DATA: Screening.

EXAM:
DIGITAL SCREENING BILATERAL MAMMOGRAM WITH CAD

[R CC · right · 5 of 5 slices shown]
[im 1/5]
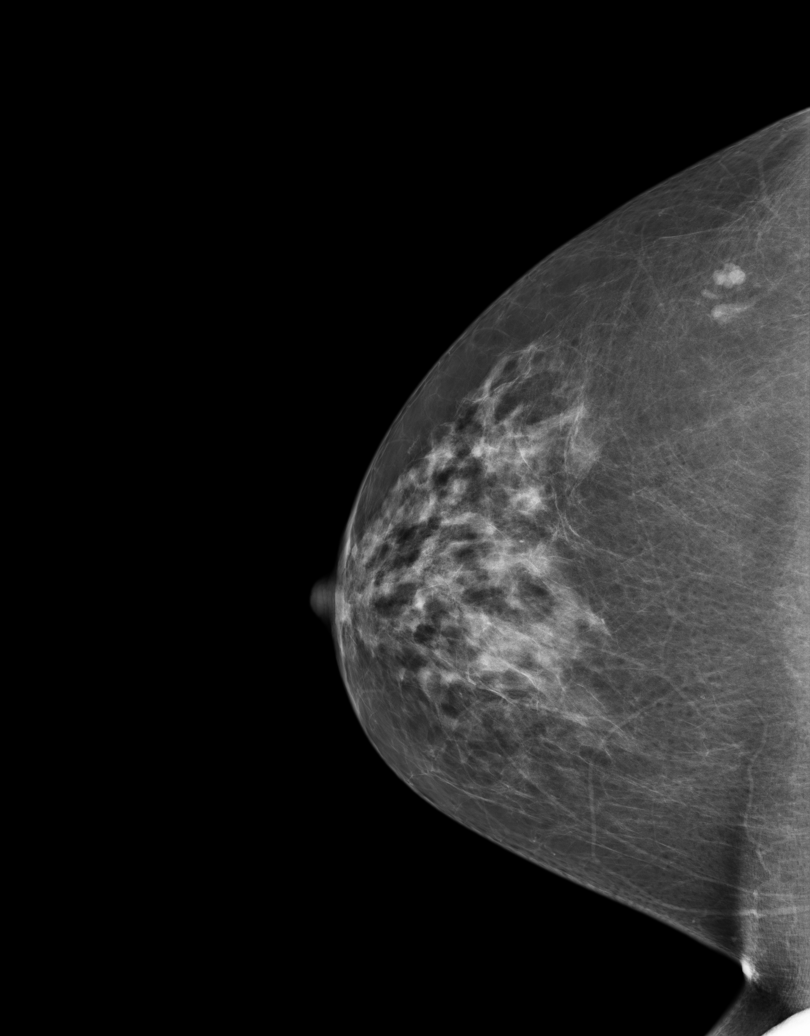
[im 2/5]
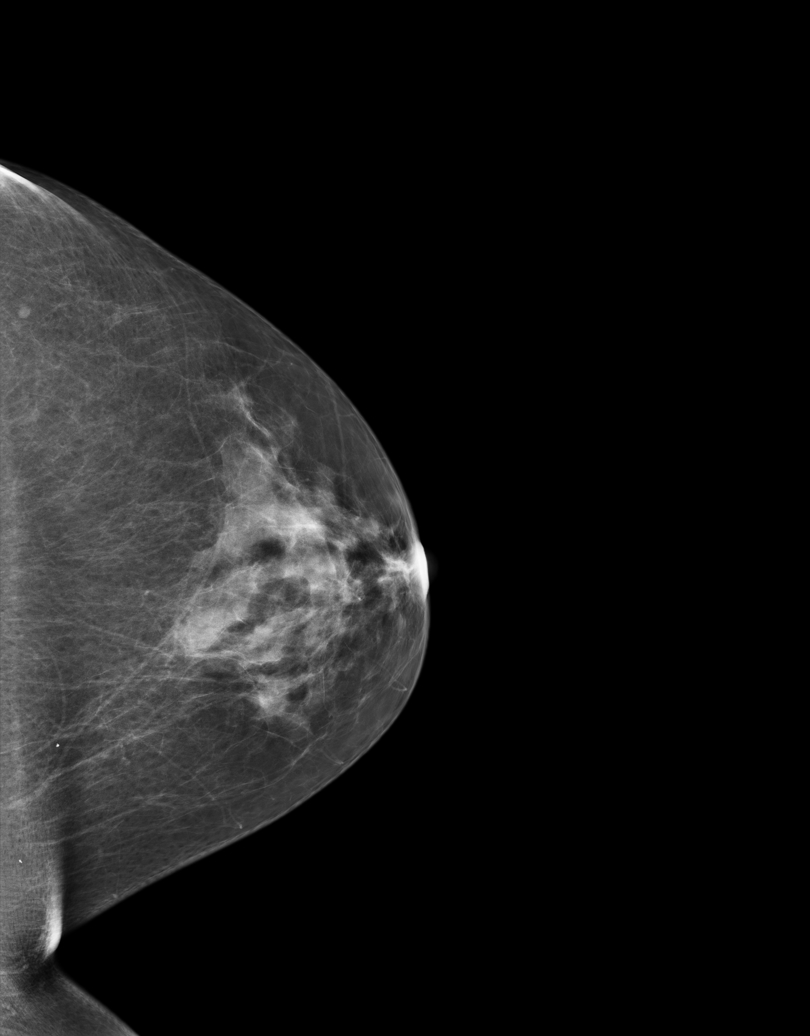
[im 3/5]
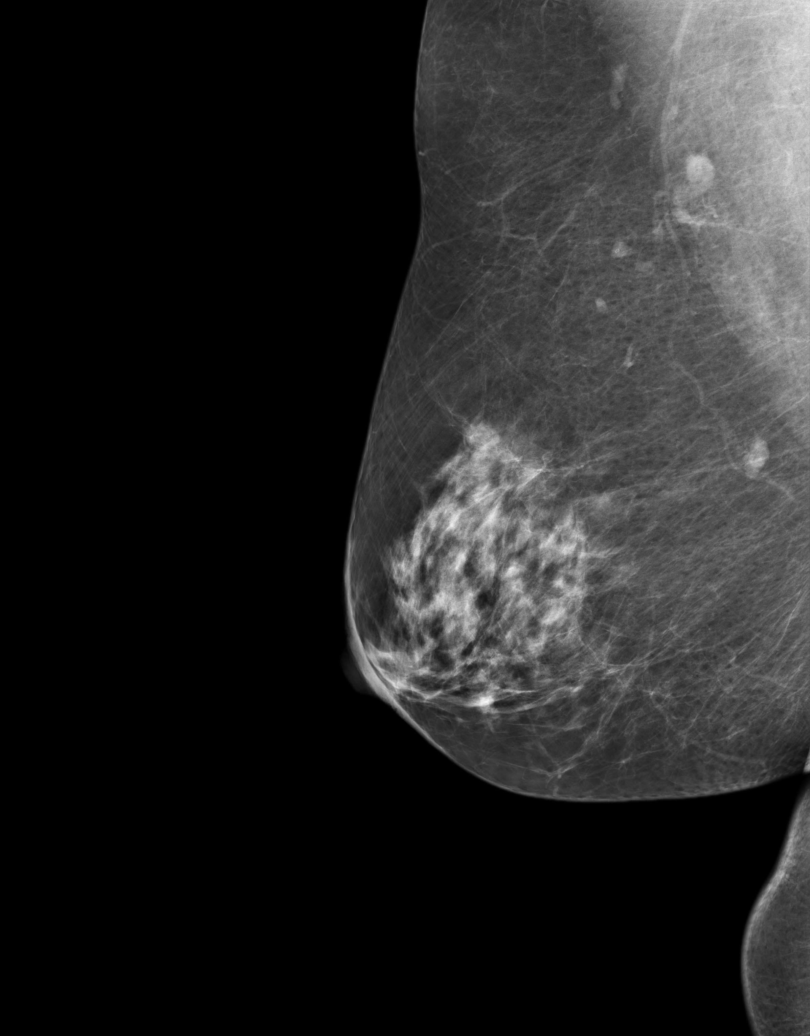
[im 4/5]
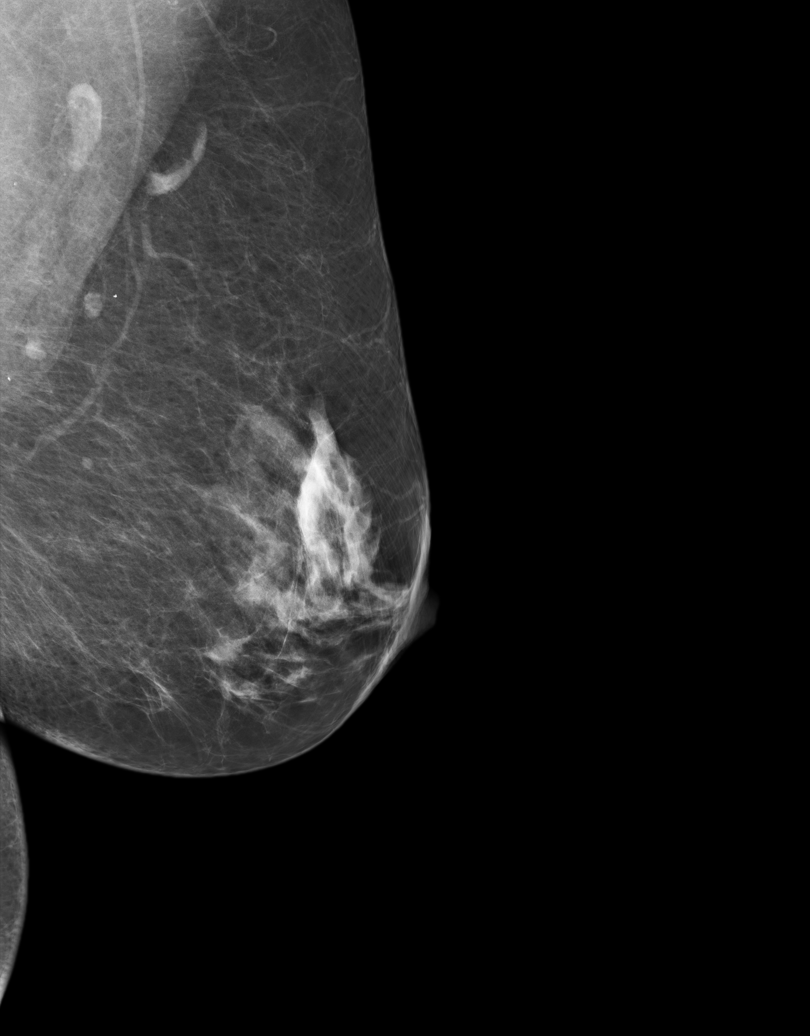
[im 5/5]
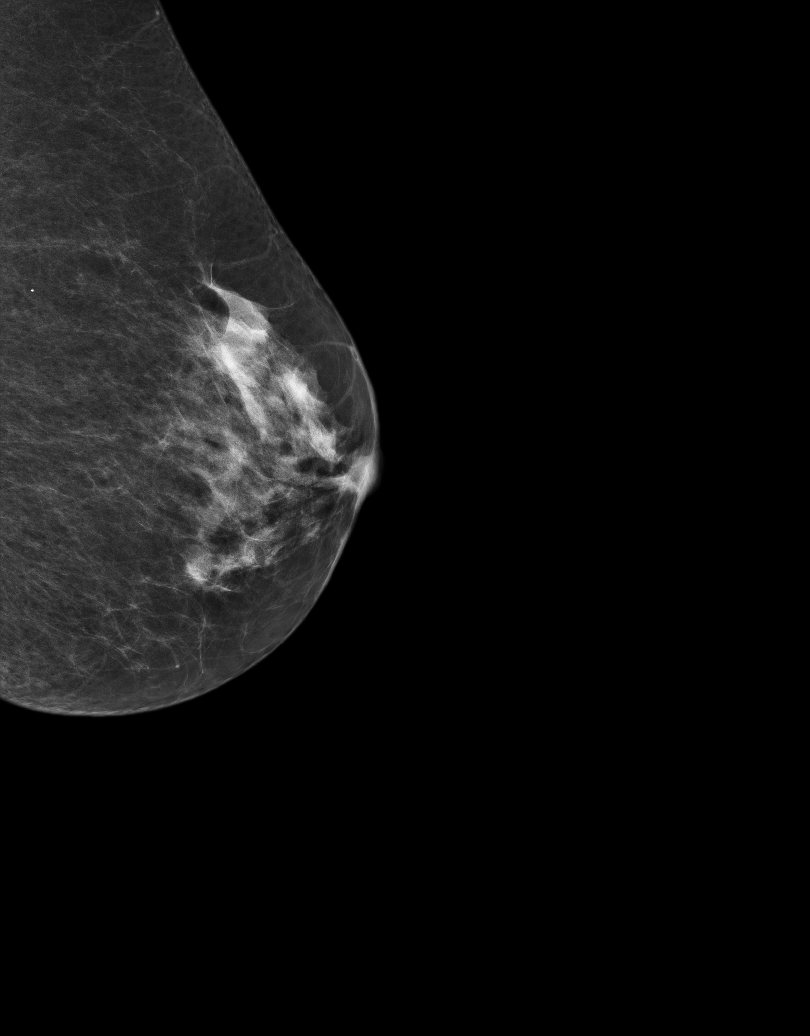

[5 of 5 positions shown; findings below may reference images not displayed]

ACR Breast Density Category b: There are scattered areas of
fibroglandular density.
FINDINGS: There are no findings suspicious for malignancy. Images were
processed with CAD.
IMPRESSION: No mammographic evidence of malignancy. A result letter of this
screening mammogram will be mailed directly to the patient.

RECOMMENDATION:
Screening mammogram in one year. (Code:AS-G-LCT)

BI-RADS CATEGORY  1: Negative.

## 2015-01-01 ENCOUNTER — Encounter: Payer: Self-pay | Admitting: Cardiovascular Disease

## 2015-01-01 ENCOUNTER — Ambulatory Visit (INDEPENDENT_AMBULATORY_CARE_PROVIDER_SITE_OTHER): Payer: Medicare Other | Admitting: Cardiovascular Disease

## 2015-01-01 VITALS — BP 94/58 | HR 54 | Resp 16 | Ht 65.0 in | Wt 170.9 lb

## 2015-01-01 DIAGNOSIS — E785 Hyperlipidemia, unspecified: Secondary | ICD-10-CM | POA: Diagnosis not present

## 2015-01-01 DIAGNOSIS — I771 Stricture of artery: Secondary | ICD-10-CM | POA: Insufficient documentation

## 2015-01-01 DIAGNOSIS — I2510 Atherosclerotic heart disease of native coronary artery without angina pectoris: Secondary | ICD-10-CM | POA: Diagnosis not present

## 2015-01-01 DIAGNOSIS — I10 Essential (primary) hypertension: Secondary | ICD-10-CM | POA: Diagnosis not present

## 2015-01-01 DIAGNOSIS — R0989 Other specified symptoms and signs involving the circulatory and respiratory systems: Secondary | ICD-10-CM | POA: Diagnosis not present

## 2015-01-01 DIAGNOSIS — I708 Atherosclerosis of other arteries: Secondary | ICD-10-CM | POA: Diagnosis not present

## 2015-01-01 HISTORY — DX: Stricture of artery: I77.1

## 2015-01-01 NOTE — Patient Instructions (Signed)
Your physician has requested that you have a carotid duplex with subclavian doppler. This test is an ultrasound of the carotid arteries in your neck. It looks at blood flow through these arteries that supply the brain with blood. Allow one hour for this exam. There are no restrictions or special instructions.  We will call you with the results.   Dr. Sallyanne Kuster recommends that you schedule a follow-up appointment in: One year.

## 2015-01-01 NOTE — Progress Notes (Signed)
Patient ID: Laura Rojas, female   DOB: 1946/04/15, 69 y.o.   MRN: [id number]     Reason for office visit CAD  Laura Rojas is a 69 year old woman with coronary disease and previous percutaneous revascularization. She had stents placed in the proximal LAD and proximal RCA (2007, 3.0 x 28 mm drug-eluting Cypher stent to RCA, 3.5 x 33 mm drug-eluting Cypher stent and proximal LAD). She is known to have an untreated stenosis in the very distal portion of the LAD and a 60% "jailed" ostial diagonal stenosis at the level of the proximal LAD stent. She has preserved LV systolic function and has never had congestive heart failure. Her most recent functional study was a nuclear stress test in 2013 that showed normal perfusion and normal LVEF. Her most recent cardiac catheterization was performed in 2009 and both the LAD and RCA stents were found to be widely patent. Significant comorbidities include diabetes mellitus type 2, hyperlipidemia and hypertension.  She has no cardiovascular complaints today. Her electrocardiogram, as always is abnormal, with mild downsloping ST depression and inverted T waves without the anterior precordium and the inferior leads. Her blood pressure appears to be quite different between the 2 upper extremities in the right arm her blood pressure is 120/78 mmHg in the left arm the systolic blood pressure is 98 mmHg and diastolic heart cough sounds are heard all the way to 0. There are no audible subclavian bruits. She does not have known subclavian artery disease or carotid disease. He does not describe left arm claudication or vertebral steal syndrome symptoms.  Glycemic control remains mediocre, with a hemoglobin A1c greater than 8%. She now has a an insulin pump and is followed by her endocrinologist in [place], Dr. Eddie Dibbles. She reports that her last lipid profile was okay.   Allergies  Allergen Reactions  . Compazine [Prochlorperazine Edisylate]     choking  . Reglan  [Metoclopramide]   . Sulfa Antibiotics     headache  . Latex Rash    Current Outpatient Prescriptions  Medication Sig Dispense Refill  . aspirin 81 MG tablet Take 81 mg by mouth daily.    . carvedilol (COREG) 25 MG tablet Take 12.5 mg by mouth 2 (two) times daily.    . clopidogrel (PLAVIX) 75 MG tablet Take 75 mg by mouth daily.    . hydrochlorothiazide (MICROZIDE) 12.5 MG capsule Take 1 capsule (12.5 mg total) by mouth daily. 90 capsule 3  . insulin aspart (NOVOLOG) 100 UNIT/ML injection Inject 6 Units into the skin 3 (three) times daily before meals.    . latanoprost (XALATAN) 0.005 % ophthalmic solution Place 1 drop into both eyes daily.    Marland Kitchen LORazepam (ATIVAN) 1 MG tablet Take 1 mg by mouth daily.    Marland Kitchen losartan (COZAAR) 100 MG tablet Take 100 mg by mouth daily.    . magnesium 30 MG tablet Take 30 mg by mouth daily.    . metFORMIN (GLUCOPHAGE) 1000 MG tablet Take 1,000 mg by mouth 2 (two) times daily.    . Omega-3 Fatty Acids (FISH OIL) 1200 MG CAPS Take 1,200 mg by mouth daily.    . pantoprazole (PROTONIX) 40 MG tablet Take 40 mg by mouth daily.    Marland Kitchen PARoxetine (PAXIL) 20 MG tablet Take 20 mg by mouth daily.    . simvastatin (ZOCOR) 40 MG tablet Take 40 mg by mouth daily.     No current facility-administered medications for this visit.    Past Medical History  Diagnosis Date  . CAD (coronary artery disease)   . DM (diabetes mellitus)   . Dyslipidemia   . Hypertension   . CAD (coronary artery disease) 12/31/2013    2007, 3.0 x 28 mm drug-eluting Cypher stent to RCA, 3.5 x 33 mm drug-eluting Cypher stent and proximal LAD) Consent by 2009 Normal perfusion by mildly 2013   . Subclavian artery stenosis, left 01/01/2015    Past Surgical History  Procedure Laterality Date  . Replacement total knee  2003 & 2004  . Vaginal hysterectomy  1983  . Breast biopsy  1970    right  . Breast biopsy  1973    left  . Coronary angioplasty with stent placement  09/17/2006    PTCA & stenting  LAD  . Cardiac catheterization  08/25/2008    patent stents    Family History  Problem Relation Age of Onset  . Cancer Mother   . Diabetes Sister   . Cancer Sister     History   Social History  . Marital Status: Married    Spouse Name: N/A    Number of Children: N/A  . Years of Education: N/A   Occupational History  . Not on file.   Social History Main Topics  . Smoking status: Never Smoker   . Smokeless tobacco: Not on file  . Alcohol Use: No  . Drug Use: No  . Sexual Activity: Not on file   Other Topics Concern  . Not on file   Social History Narrative    Review of systems: The patient specifically denies any chest pain at rest or with exertion, dyspnea at rest or with exertion, orthopnea, paroxysmal nocturnal dyspnea, syncope, palpitations, focal neurological deficits, intermittent claudication, lower extremity edema, unexplained weight gain, cough, hemoptysis or wheezing.  The patient also denies abdominal pain, nausea, vomiting, dysphagia, diarrhea, constipation, polyuria, polydipsia, dysuria, hematuria, frequency, urgency, abnormal bleeding or bruising, fever, chills, unexpected weight changes, mood swings, change in skin or hair texture, change in voice quality, auditory or visual problems, allergic reactions or rashes, new musculoskeletal complaints other than usual "aches and pains".   PHYSICAL EXAM BP 94/58 mmHg  Pulse 54  Ht 5\' 5"  (1.651 m)  Wt 77.52 kg (170 lb 14.4 oz)  BMI 28.44 kg/m2  General: Alert, oriented x3, no distress Head: no evidence of trauma, PERRL, EOMI, no exophtalmos or lid lag, no myxedema, no xanthelasma; normal ears, nose and oropharynx Neck: normal jugular venous pulsations and no hepatojugular reflux; brisk carotid pulses without delay and no carotid bruits Chest: clear to auscultation, no signs of consolidation by percussion or palpation, normal fremitus, symmetrical and full respiratory excursions Cardiovascular: normal position  and quality of the apical impulse, regular rhythm, normal first and second heart sounds, no murmurs, rubs or gallops Abdomen: no tenderness or distention, no masses by palpation, no abnormal pulsatility or arterial bruits, normal bowel sounds, no hepatosplenomegaly Extremities: no clubbing, cyanosis or edema; 2+ radial, ulnar and brachial pulses bilaterally; 2+ right femoral, posterior tibial and dorsalis pedis pulses; 2+ left femoral, posterior tibial and dorsalis pedis pulses; no subclavian or femoral bruits Neurological: grossly nonfocal   EKG: Sinus bradycardia, chronic ST segment depression and inverted T waves across the entire anterior precordium as well as in the inferior limb leads. This is not changed from previous tracings. QTC 405 ms  Lipid Panel  November 2014 Total cholesterol 135, HDL 40, triglycerides 207, LDL 54 Creatinine 1.1 (GFR 52) hemoglobin 12.2, normal liver function tests, normal TSH  No results found for: CHOL, TRIG, HDL, CHOLHDL, VLDL, LDLCALC, LDLDIRECT  BMET No results found for: NA, K, CL, CO2, GLUCOSE, BUN, CREATININE, CALCIUM, GFRNONAA, GFRAA   ASSESSMENT AND PLAN  CAD s/p stents Asymptomatic. Focus on risk factors  Hyperlipidemia Retrieve labs from her endocrinologist  PAD Has physical exam evidence of left subclavian stenosis. I think she needs duplex ultrasonography of both her carotids and the subclavians. Asked her daughters have her blood pressure checked in the right arm Orders Placed This Encounter  Procedures  . EKG 12-Lead  . Doppler carotid   No orders of the defined types were placed in this encounter.    Holli Humbles, MD, [place] [phone] office [phone] pager

## 2015-01-04 ENCOUNTER — Ambulatory Visit (HOSPITAL_COMMUNITY)
Admission: RE | Admit: 2015-01-04 | Discharge: 2015-01-04 | Disposition: A | Discharge status: Home / Self Care | Payer: Medicare Other | Source: Ambulatory Visit | Attending: Cardiology | Admitting: Cardiology

## 2015-01-04 DIAGNOSIS — I771 Stricture of artery: Secondary | ICD-10-CM | POA: Diagnosis not present

## 2015-01-04 DIAGNOSIS — R0989 Other specified symptoms and signs involving the circulatory and respiratory systems: Secondary | ICD-10-CM | POA: Diagnosis not present

## 2015-01-04 NOTE — Progress Notes (Signed)
Carotid Duplex Completed. No evidence of stenosis noted in the ICA or the subclavian arteries bilaterally. Oda Cogan, BS, RDMS, RVT

## 2015-01-13 DIAGNOSIS — J019 Acute sinusitis, unspecified: Secondary | ICD-10-CM | POA: Diagnosis not present

## 2015-02-04 DIAGNOSIS — R05 Cough: Secondary | ICD-10-CM | POA: Diagnosis not present

## 2015-02-12 ENCOUNTER — Ambulatory Visit: Payer: Self-pay | Admitting: Family Medicine

## 2015-02-12 DIAGNOSIS — K225 Diverticulum of esophagus, acquired: Secondary | ICD-10-CM | POA: Diagnosis not present

## 2015-02-12 DIAGNOSIS — K228 Other specified diseases of esophagus: Secondary | ICD-10-CM | POA: Diagnosis not present

## 2015-02-12 DIAGNOSIS — R1319 Other dysphagia: Secondary | ICD-10-CM | POA: Diagnosis not present

## 2015-02-17 ENCOUNTER — Encounter: Payer: Self-pay | Admitting: Internal Medicine

## 2015-02-17 ENCOUNTER — Ambulatory Visit (INDEPENDENT_AMBULATORY_CARE_PROVIDER_SITE_OTHER): Payer: Medicare Other | Admitting: Internal Medicine

## 2015-02-17 ENCOUNTER — Encounter (INDEPENDENT_AMBULATORY_CARE_PROVIDER_SITE_OTHER): Payer: Self-pay

## 2015-02-17 VITALS — BP 90/60 | HR 52 | Ht 63.0 in | Wt 169.0 lb

## 2015-02-17 DIAGNOSIS — I2510 Atherosclerotic heart disease of native coronary artery without angina pectoris: Secondary | ICD-10-CM

## 2015-02-17 DIAGNOSIS — R059 Cough, unspecified: Secondary | ICD-10-CM | POA: Insufficient documentation

## 2015-02-17 DIAGNOSIS — R05 Cough: Secondary | ICD-10-CM | POA: Diagnosis not present

## 2015-02-17 DIAGNOSIS — R053 Chronic cough: Secondary | ICD-10-CM

## 2015-02-17 MED ORDER — MOMETASONE FUROATE 50 MCG/ACT NA SUSP: 2.0000 | Freq: Every day | NASAL | Status: DC

## 2015-02-17 NOTE — Assessment & Plan Note (Addendum)
The standardized cough guidelines published in Chest by Lissa Morales in 2006 are still the best available and consist of a multiple step process (up to 12!) , not a single office visit,  and are intended  to address this problem logically,  with an alogrithm dependent on response to empiric treatment at  each progressive step  to determine a specific diagnosis with  minimal addtional testing needed. Therefore if adherence is an issue or can't be accurately verified,  it's very unlikely the standard evaluation and treatment will be successful here.    Furthermore, response to therapy (other than acute cough suppression, which should only be used short term with avoidance of narcotic containing cough syrups if possible), can be a gradual process for which the patient may perceive immediate benefit.  I suspect that her chronic cough is multi-factorial: differential as stated below, mild obesity, deconditioning, chronic allergies, prolonged tobacco exposure. Differential diagnosis includes - UACS, obstructive vs restrictive disease, allergies  Plan - pfts - 6 min walk test - trial of nasal steroid

## 2015-02-17 NOTE — Patient Instructions (Signed)
Follow up with Dr. Stevenson Clinch in 2 wks - pulmonary function testing and 6 minute walk test prior to follow up - Flonase - 1 spray to each nostril daily.

## 2015-02-17 NOTE — Progress Notes (Signed)
Date: 02/17/2015  MRN# [id number] Laura Rojas 28-Dec-1945  Referring Physician: Dr. Enid Derry  Laura Rojas is a 69 y.o. old female seen in consultation for chronic cough for the past 10 years.  CC:  Chief Complaint  Patient presents with  . Advice Only    Pt has had ongoing cough with mucus yellow/clear for 10 years.Pt denies chest tightness,wheeze and or sob.    HPI:  Patient is a pleasant 69 year old female presenting today for evaluation of chronic cough, over the past 10 years. Patient was recently seen at her primary care office for a repeat episode of worsening cough she was placed on antibiotics and Tessalon Perles, however this provided temporary relief.  Per review of office note she had spirometry done in October 2015 which was normal at that time, her chest x-ray from October 2015 revealed diffuse nodular thickening of the pulmonary interstitium, suspected of bronchitis. Today patient states that she is doing fairly well, she does endorse a cough, dry mostly. Cough characteristics as follows: -Seasonal pattern - worst in fall, and sometimes in spring -Cough - usually not daily, "barking type," intermittent production of sputum which can be thick white or yellow.  -Has had a antibiotics in the past, followed with anti tussive -Has had episode of vomiting after cough -Cough spells can last about 3-5 mins, peppermint helps. Cough is usually worst at night also. Cough keeps her up at night at times.  -Has tried multiple OTC cough suppressant with little relief. Patient states that she has bronchitis episodes about 2 times a year, which last for weeks, usually treated with antibiotics and steroids. Never smoker.  Had almost 45 years of second hand smoke exposure (between Father and husband).  Pet dog and cat.  Has tried over the counter reflux pills in the past without help.   PMHX:   Past Medical History  Diagnosis Date  . CAD (coronary artery disease)   . DM (diabetes  mellitus)   . Dyslipidemia   . Hypertension   . CAD (coronary artery disease) 12/31/2013    2007, 3.0 x 28 mm drug-eluting Cypher stent to RCA, 3.5 x 33 mm drug-eluting Cypher stent and proximal LAD) Consent by 2009 Normal perfusion by mildly 2013   . Subclavian artery stenosis, left 01/01/2015   Surgical Hx:  Past Surgical History  Procedure Laterality Date  . Replacement total knee  2003 & 2004  . Vaginal hysterectomy  1983  . Breast biopsy  1970    right  . Breast biopsy  1973    left  . Coronary angioplasty with stent placement  09/17/2006    PTCA & stenting LAD  . Cardiac catheterization  08/25/2008    patent stents   Family Hx:  Family History  Problem Relation Age of Onset  . Cancer Mother   . Diabetes Sister   . Cancer Sister    Social Hx:   History  Substance Use Topics  . Smoking status: Never Smoker   . Smokeless tobacco: Never Used  . Alcohol Use: No   Medication:   Current Outpatient Rx  Name  Route  Sig  Dispense  Refill  . aspirin 81 MG tablet   Oral   Take 81 mg by mouth daily.         . carvedilol (COREG) 25 MG tablet   Oral   Take 12.5 mg by mouth 2 (two) times daily.         . clopidogrel (PLAVIX) 75 MG  tablet   Oral   Take 75 mg by mouth daily.         . hydrochlorothiazide (MICROZIDE) 12.5 MG capsule   Oral   Take 1 capsule (12.5 mg total) by mouth daily.   90 capsule   3   . insulin aspart (NOVOLOG) 100 UNIT/ML injection   Subcutaneous   Inject 6 Units into the skin 3 (three) times daily before meals.         . latanoprost (XALATAN) 0.005 % ophthalmic solution   Both Eyes   Place 1 drop into both eyes daily.         Marland Kitchen LORazepam (ATIVAN) 1 MG tablet   Oral   Take 1 mg by mouth daily.         Marland Kitchen losartan (COZAAR) 100 MG tablet   Oral   Take 100 mg by mouth daily.         . magnesium 30 MG tablet   Oral   Take 30 mg by mouth daily.         . metFORMIN (GLUCOPHAGE) 1000 MG tablet   Oral   Take 1,000 mg by mouth  2 (two) times daily.         . Omega-3 Fatty Acids (FISH OIL) 1200 MG CAPS   Oral   Take 1,200 mg by mouth daily.         . pantoprazole (PROTONIX) 40 MG tablet   Oral   Take 40 mg by mouth daily.         Marland Kitchen PARoxetine (PAXIL) 20 MG tablet   Oral   Take 20 mg by mouth daily.         . simvastatin (ZOCOR) 40 MG tablet   Oral   Take 40 mg by mouth daily.         . benzonatate (TESSALON) 100 MG capsule   Oral   Take 100 mg by mouth daily as needed.      0   . isosorbide mononitrate (IMDUR) 60 MG 24 hr tablet   Oral   Take 60 mg by mouth daily.      4       Allergies:  Compazine; Reglan; Sulfa antibiotics; and Latex  Review of Systems: Gen:  Denies  fever, sweats, chills HEENT: Denies blurred vision, double vision, ear pain, eye pain, hearing loss, nose bleeds, sore throat Cvc:  No dizziness, chest pain or heaviness Resp:  Cough, some runny nose.  Gi: Denies swallowing difficulty, stomach pain, nausea or vomiting, diarrhea, constipation, bowel incontinence Gu:  Denies bladder incontinence, burning urine Ext:   No Joint pain, stiffness or swelling Skin: No skin rash, easy bruising or bleeding or hives Endoc:  No polyuria, polydipsia , polyphagia or weight change Psych: No depression, insomnia or hallucinations  Other:  All other systems negative  Physical Examination:   VS: BP 90/60 mmHg  Pulse 52  SpO2 94%  General Appearance: No distress  Neuro:without focal findings, mental status, speech normal, alert and oriented, cranial nerves 2-12 intact, reflexes normal and symmetric, sensation grossly normal  HEENT: PERRLA, EOM intact, no ptosis, no other lesions noticed; Mallampati 2 Pulmonary: normal breath sounds., diaphragmatic excursion normal.No wheezing, No rales;   Sputum Production:   none CardiovascularNormal S1,S2.  No m/r/g.  Abdominal aorta pulsation normal.    Abdomen: Benign, Soft, non-tender, No masses, hepatosplenomegaly, No  lymphadenopathy Renal:  No costovertebral tenderness  GU:  No performed at this time. Endoc: No evident thyromegaly, no signs  of acromegaly or Cushing features Skin:   warm, no rashes, no ecchymosis  Extremities: normal, no cyanosis, clubbing, no edema, warm with normal capillary refill. Other findings:none    Rad results: (The following images and results were reviewed by Dr. Stevenson Clinch). CXR 09/02/2014 EXAM: CHEST 2 VIEW  COMPARISON: None.  FINDINGS: Borderline enlarged cardiac silhouette and mediastinal contours with mild tortuosity and possible slight ectasia of the thoracic aorta. There is diffuse nodular thickening of the pulmonary interstitium. Veiling opacities overlying the bilateral lungs are favored to represent overlying breast tissues. No discrete focal airspace opacities. No pleural effusion pneumothorax. No evidence of edema. No acute osseus abnormalities.  IMPRESSION: Findings suggestive of airways disease / bronchitis. No focal airspace opacities to suggest pneumonia.     Assessment and Plan:69 year old female past medical history of CAD, hypertension, diabetes, seen in consultation for chronic cough. Chronic cough The standardized cough guidelines published in Chest by Lissa Morales in 2006 are still the best available and consist of a multiple step process (up to 12!) , not a single office visit,  and are intended  to address this problem logically,  with an alogrithm dependent on response to empiric treatment at  each progressive step  to determine a specific diagnosis with  minimal addtional testing needed. Therefore if adherence is an issue or can't be accurately verified,  it's very unlikely the standard evaluation and treatment will be successful here.    Furthermore, response to therapy (other than acute cough suppression, which should only be used short term with avoidance of narcotic containing cough syrups if possible), can be a gradual process for which the  patient may perceive immediate benefit.  I suspect that her chronic cough is multi-factorial: differential as stated below, mild obesity, deconditioning, chronic allergies, prolonged tobacco exposure. Differential diagnosis includes - UACS, obstructive vs restrictive disease, allergies  Plan - pfts - 6 min walk test - trial of nasal steroid      Updated Medication List Outpatient Encounter Prescriptions as of 02/17/2015  Medication Sig  . aspirin 81 MG tablet Take 81 mg by mouth daily.  . carvedilol (COREG) 25 MG tablet Take 12.5 mg by mouth 2 (two) times daily.  . clopidogrel (PLAVIX) 75 MG tablet Take 75 mg by mouth daily.  . hydrochlorothiazide (MICROZIDE) 12.5 MG capsule Take 1 capsule (12.5 mg total) by mouth daily.  . insulin aspart (NOVOLOG) 100 UNIT/ML injection Inject 6 Units into the skin 3 (three) times daily before meals.  . latanoprost (XALATAN) 0.005 % ophthalmic solution Place 1 drop into both eyes daily.  Marland Kitchen LORazepam (ATIVAN) 1 MG tablet Take 1 mg by mouth daily.  Marland Kitchen losartan (COZAAR) 100 MG tablet Take 100 mg by mouth daily.  . magnesium 30 MG tablet Take 30 mg by mouth daily.  . metFORMIN (GLUCOPHAGE) 1000 MG tablet Take 1,000 mg by mouth 2 (two) times daily.  . Omega-3 Fatty Acids (FISH OIL) 1200 MG CAPS Take 1,200 mg by mouth daily.  . pantoprazole (PROTONIX) 40 MG tablet Take 40 mg by mouth daily.  Marland Kitchen PARoxetine (PAXIL) 20 MG tablet Take 20 mg by mouth daily.  . simvastatin (ZOCOR) 40 MG tablet Take 40 mg by mouth daily.  . benzonatate (TESSALON) 100 MG capsule Take 100 mg by mouth daily as needed.  . isosorbide mononitrate (IMDUR) 60 MG 24 hr tablet Take 60 mg by mouth daily.    Orders for this visit: No orders of the defined types were placed in this encounter.  Thank  you for the consultation and for allowing [facility] to assist in the care of your patient. Our recommendations are noted above.  Please contact us if we can be of  further service.   Vilinda Boehringer, MD [facility] Office Number: [phone]

## 2015-02-20 ENCOUNTER — Other Ambulatory Visit: Payer: Self-pay | Admitting: Cardiovascular Disease

## 2015-02-22 NOTE — Telephone Encounter (Signed)
Rx(s) sent to pharmacy electronically.  

## 2015-02-23 ENCOUNTER — Telehealth: Payer: Self-pay | Admitting: *Deleted

## 2015-02-23 DIAGNOSIS — R05 Cough: Secondary | ICD-10-CM

## 2015-02-23 DIAGNOSIS — R053 Chronic cough: Secondary | ICD-10-CM

## 2015-02-23 NOTE — Telephone Encounter (Signed)
PFT order placed

## 2015-02-26 DIAGNOSIS — K222 Esophageal obstruction: Secondary | ICD-10-CM | POA: Diagnosis not present

## 2015-02-26 DIAGNOSIS — K225 Diverticulum of esophagus, acquired: Secondary | ICD-10-CM | POA: Diagnosis not present

## 2015-02-26 DIAGNOSIS — Z79899 Other long term (current) drug therapy: Secondary | ICD-10-CM | POA: Diagnosis not present

## 2015-02-26 DIAGNOSIS — R1110 Vomiting, unspecified: Secondary | ICD-10-CM | POA: Diagnosis not present

## 2015-03-02 ENCOUNTER — Ambulatory Visit (INDEPENDENT_AMBULATORY_CARE_PROVIDER_SITE_OTHER): Payer: Medicare Other | Admitting: Internal Medicine

## 2015-03-02 ENCOUNTER — Encounter: Payer: Self-pay | Admitting: Internal Medicine

## 2015-03-02 VITALS — BP 118/68 | HR 60 | Ht 65.0 in | Wt 170.0 lb

## 2015-03-02 DIAGNOSIS — R053 Chronic cough: Secondary | ICD-10-CM

## 2015-03-02 DIAGNOSIS — R05 Cough: Secondary | ICD-10-CM

## 2015-03-02 DIAGNOSIS — E1165 Type 2 diabetes mellitus with hyperglycemia: Secondary | ICD-10-CM | POA: Diagnosis not present

## 2015-03-02 DIAGNOSIS — R0602 Shortness of breath: Secondary | ICD-10-CM

## 2015-03-02 DIAGNOSIS — I2510 Atherosclerotic heart disease of native coronary artery without angina pectoris: Secondary | ICD-10-CM | POA: Diagnosis not present

## 2015-03-02 LAB — PULMONARY FUNCTION TEST
DL/VA % pred: 79 %
DL/VA: 3.92 ml/min/mmHg/L
DLCO unc % pred: 55 %
DLCO unc: 14.13 ml/min/mmHg
FEF 25-75 PRE: 2.19 L/s
FEF 25-75 Post: 2.14 L/sec
FEF2575-%CHANGE-POST: -2 %
FEF2575-%PRED-PRE: 111 %
FEF2575-%Pred-Post: 108 %
FEV1-%CHANGE-POST: 0 %
FEV1-%Pred-Post: 76 %
FEV1-%Pred-Pre: 76 %
FEV1-PRE: 1.82 L
FEV1-Post: 1.81 L
FEV1FVC-%Change-Post: 3 %
FEV1FVC-%PRED-PRE: 110 %
FEV6-%CHANGE-POST: -3 %
FEV6-%PRED-POST: 69 %
FEV6-%Pred-Pre: 72 %
FEV6-POST: 2.09 L
FEV6-Pre: 2.17 L
FEV6FVC-%PRED-PRE: 104 %
FEV6FVC-%Pred-Post: 104 %
FVC-%CHANGE-POST: -3 %
FVC-%PRED-POST: 66 %
FVC-%PRED-PRE: 69 %
FVC-POST: 2.09 L
FVC-Pre: 2.17 L
POST FEV6/FVC RATIO: 100 %
Post FEV1/FVC ratio: 87 %
Pre FEV1/FVC ratio: 84 %
Pre FEV6/FVC Ratio: 100 %
RV % pred: 102 %
RV: 2.29 L
TLC % PRED: 86 %
TLC: 4.47 L

## 2015-03-02 MED ORDER — MOMETASONE FUROATE 50 MCG/ACT NA SUSP: 2.0000 | Freq: Every day | NASAL | Status: DC

## 2015-03-02 NOTE — Progress Notes (Signed)
PFT performed today. 

## 2015-03-02 NOTE — Progress Notes (Signed)
SMW performed today. 

## 2015-03-02 NOTE — Assessment & Plan Note (Signed)
The standardized cough guidelines published in Chest by Lissa Morales in 2006 are still the best available and consist of a multiple step process (up to 12!) , not a single office visit,  and are intended  to address this problem logically,  with an alogrithm dependent on response to empiric treatment at  each progressive step  to determine a specific diagnosis with  minimal addtional testing needed. Therefore if adherence is an issue or can't be accurately verified,  it's very unlikely the standard evaluation and treatment will be successful here.    Furthermore, response to therapy (other than acute cough suppression, which should only be used short term with avoidance of narcotic containing cough syrups if possible), can be a gradual process for which the patient may perceive immediate benefit.  I suspect that her chronic cough is multi-factorial: differential as stated below Differential diagnosis includes - UACS, suspected ashtma There is benefit from nasal steroid, her PFTs today shows mild restriction (FEV1/FVC =84, FEV1=76), ERV 16, SVC 69, DLCO 55.  Patient noted to have inc cough after administration of albuterol for pfts, which dissipated at the end of the visit.  In addition her recent barium swallow shows - small Zenker's diverticulum, mild intermittent tertiary contractions o f teh distal third of the esophagus, mild relative narrowing of the distal esophagus consistent with a Schatzki's ring (which does not restrict the passage of a barium tablet). 6MWT - 271m/797ft  Plan - trial of ICS (Arnuity ellipta -28 day trial) - continue with nasal corticosteroid - especially with high pollen days - if above is inconclusive with consider a HRCT at next visit (has a mild restriction and moderate decrease in PFTs).  - patient has follow up with GI for upper EGD and barium swallow study results.

## 2015-03-02 NOTE — Patient Instructions (Signed)
Follow up in 6 weeks with Dr. Stevenson Clinch - we will give you a 28 day trial of inhaled corticosteroid (Arnuity Ellipta, 128mcg) for suspected Asthma - take 1 puff once a day - gargle and rinse after each use - continue with nasal steroid - especially with high pollen days.

## 2015-03-02 NOTE — Progress Notes (Signed)
MRN# [id number] Laura Rojas 1946/03/15   CC: Chief Complaint  Patient presents with  . Follow-up    prod cough, SOB w/activity; no changes, same since last visit      Brief History: HPI 02/17/15: Patient is a pleasant 69 year old female presenting today for evaluation of chronic cough, over the past 10 years. Patient was recently seen at her primary care office for a repeat episode of worsening cough she was placed on antibiotics and Tessalon Perles, however this provided temporary relief. Per review of office note she had spirometry done in October 2015 which was normal at that time, her chest x-ray from October 2015 revealed diffuse nodular thickening of the pulmonary interstitium, suspected of bronchitis. Today patient states that she is doing fairly well, she does endorse a cough, dry mostly. Cough characteristics as follows: -Seasonal pattern - worst in fall, and sometimes in spring -Cough - usually not daily, "barking type," intermittent production of sputum which can be thick white or yellow.  -Has had a antibiotics in the past, followed with anti tussive -Has had episode of vomiting after cough -Cough spells can last about 3-5 mins, peppermint helps. Cough is usually worst at night also. Cough keeps her up at night at times.  -Has tried multiple OTC cough suppressant with little relief. Patient states that she has bronchitis episodes about 2 times a year, which last for weeks, usually treated with antibiotics and steroids. Never smoker. Had almost 45 years of second hand smoke exposure (between Father and husband).  Pet dog and cat.  Has tried over the counter reflux pills in the past without help.  PLAN - pfts, 73mwt, trial of nasal steriod   Events since last clinic visit: Patient presents today for a follow up visit of her cough along with PFTs, 60mwt.  Patient stated that nasal steroid (nasonex) helped, but it was $100 per bottle, she is still using it, states that  she is not coughing as much. Since her last visit her PMD also ordered a barium swallow for her, which she has completed.  Overall today patient states that she is doing fairly well, still with chronic cough, but noted that it is a high allergy season for her. Patient had a pulmonary function testing done today, off note, she was having some moderate coughing after administration of albuterol which subsided throughout the visit.  PMHX:   Past Medical History  Diagnosis Date  . CAD (coronary artery disease)   . DM (diabetes mellitus)   . Dyslipidemia   . Hypertension   . CAD (coronary artery disease) 12/31/2013    2007, 3.0 x 28 mm drug-eluting Cypher stent to RCA, 3.5 x 33 mm drug-eluting Cypher stent and proximal LAD) Consent by 2009 Normal perfusion by mildly 2013   . Subclavian artery stenosis, left 01/01/2015   Surgical Hx:  Past Surgical History  Procedure Laterality Date  . Replacement total knee  2003 & 2004  . Vaginal hysterectomy  1983  . Breast biopsy  1970    right  . Breast biopsy  1973    left  . Coronary angioplasty with stent placement  09/17/2006    PTCA & stenting LAD  . Cardiac catheterization  08/25/2008    patent stents   Family Hx:  Family History  Problem Relation Age of Onset  . Cancer Mother   . Diabetes Sister   . Cancer Sister    Social Hx:   History  Substance Use Topics  . Smoking status: Never  Smoker   . Smokeless tobacco: Never Used  . Alcohol Use: No   Medication:   Current Outpatient Rx  Name  Route  Sig  Dispense  Refill  . aspirin 81 MG tablet   Oral   Take 81 mg by mouth daily.         . benzonatate (TESSALON) 100 MG capsule   Oral   Take 100 mg by mouth daily as needed.      0   . carvedilol (COREG) 25 MG tablet   Oral   Take 12.5 mg by mouth 2 (two) times daily.         . clopidogrel (PLAVIX) 75 MG tablet   Oral   Take 75 mg by mouth daily.         . hydrochlorothiazide (MICROZIDE) 12.5 MG capsule      TAKE 1  CAPSULE (12.5 MG TOTAL) BY MOUTH DAILY.   90 capsule   3   . insulin aspart (NOVOLOG) 100 UNIT/ML injection   Subcutaneous   Inject 6 Units into the skin 3 (three) times daily before meals.         . isosorbide mononitrate (IMDUR) 60 MG 24 hr tablet   Oral   Take 60 mg by mouth daily.      4   . latanoprost (XALATAN) 0.005 % ophthalmic solution   Both Eyes   Place 1 drop into both eyes daily.         Marland Kitchen LORazepam (ATIVAN) 1 MG tablet   Oral   Take 1 mg by mouth daily.         Marland Kitchen losartan (COZAAR) 100 MG tablet   Oral   Take 100 mg by mouth daily.         . magnesium 30 MG tablet   Oral   Take 30 mg by mouth daily.         . metFORMIN (GLUCOPHAGE) 1000 MG tablet   Oral   Take 1,000 mg by mouth 2 (two) times daily.         . mometasone (NASONEX) 50 MCG/ACT nasal spray   Nasal   Place 2 sprays into the nose daily.   17 g   0   . Omega-3 Fatty Acids (FISH OIL) 1200 MG CAPS   Oral   Take 1,200 mg by mouth daily.         . pantoprazole (PROTONIX) 40 MG tablet   Oral   Take 40 mg by mouth daily.         Marland Kitchen PARoxetine (PAXIL) 20 MG tablet   Oral   Take 20 mg by mouth daily.         . simvastatin (ZOCOR) 40 MG tablet   Oral   Take 40 mg by mouth daily.         . mometasone (NASONEX) 50 MCG/ACT nasal spray   Nasal   Place 2 sprays into the nose daily.   51 g   0      Review of Systems: Gen:  Denies  fever, sweats, chills HEENT: Denies blurred vision, double vision, ear pain, eye pain, hearing loss, nose bleeds, sore throat Cvc:  No dizziness, chest pain or heaviness Resp:   Chronic cough Gi: Denies swallowing difficulty, stomach pain, nausea or vomiting, diarrhea, constipation, bowel incontinence Gu:  Denies bladder incontinence, burning urine Ext:   No Joint pain, stiffness or swelling Skin: No skin rash, easy bruising or bleeding or hives Endoc:  No polyuria,  polydipsia , polyphagia or weight change Psych: No depression, insomnia or  hallucinations  Other:  All other systems negative  Allergies:  Compazine; Reglan; Sulfa antibiotics; and Latex  Physical Examination:  VS: BP 118/68 mmHg  Pulse 60  Ht 5\' 5"  (1.651 m)  Wt 170 lb (77.111 kg)  BMI 28.29 kg/m2  SpO2 98%  General Appearance: No distress  HEENT: PERRLA, EOM intact, no ptosis, no other lesions noticed Pulmonary:Exam: normal breath sounds., diaphragmatic excursion normal.No wheezing, No rales   Cardiovascular:@ Exam:  Normal S1,S2.  No m/r/g.     Abdomen:Exam: Benign, Soft, non-tender, No masses  Skin:   warm, no rashes, no ecchymosis  Extremities: normal, no cyanosis, clubbing, no edema, warm with normal capillary refill.   Labs results:  BMP No results found for: NA, K, CL, CO2, GLUCOSE, BUN, CREATININE   CBC No flowsheet data found.   Rad results: (The following images and results were reviewed by Dr. Stevenson Clinch). 01/2015 Barium Swallow There was normal pharyngeal anatomy and motility. Contrast flowed freely through the esophagus without evidence of a mass. There is prominence of the cricopharyngeus impression on the esophagus. There is a small Zenker's diverticulum. There was normal esophageal mucosa without evidence of irregularity or ulceration. There are mild intermittent tertiary contractions of the distal third of the esophagus. There is mild relative narrowing of the distal esophagus consistent with a Schatzki's ring which does not restrict the passage of a barium tablet. No evidence of reflux. No definite hiatal hernia was demonstrated.  At the end of the examination a 13 mm barium tablet was administered which transited through the esophagus and esophagogastric junction without delay.  There is 3 mm of anterolisthesis of C4 on C5 secondary to facet disease. There is degenerative disc disease at C5-6 and C6-7.  IMPRESSION: 1. There is a small Zenker's diverticulum. 2. There are mild intermittent tertiary contractions of the  distal third of the esophagus which may be secondary to presbyesophagus versus spasm. 3. There is mild relative narrowing of the distal esophagus consistent with a Schatzki's ring which does not restrict the passage of a barium tablet.    Assessment and Plan: 69 year old female being evaluated for chronic cough over the past 10 years Chronic cough The standardized cough guidelines published in Chest by Lissa Morales in 2006 are still the best available and consist of a multiple step process (up to 12!) , not a single office visit,  and are intended  to address this problem logically,  with an alogrithm dependent on response to empiric treatment at  each progressive step  to determine a specific diagnosis with  minimal addtional testing needed. Therefore if adherence is an issue or can't be accurately verified,  it's very unlikely the standard evaluation and treatment will be successful here.    Furthermore, response to therapy (other than acute cough suppression, which should only be used short term with avoidance of narcotic containing cough syrups if possible), can be a gradual process for which the patient may perceive immediate benefit.  I suspect that her chronic cough is multi-factorial: differential as stated below Differential diagnosis includes - UACS, suspected ashtma There is benefit from nasal steroid, her PFTs today shows mild restriction (FEV1/FVC =84, FEV1=76), ERV 16, SVC 69, DLCO 55.  Patient noted to have inc cough after administration of albuterol for pfts, which dissipated at the end of the visit.  In addition her recent barium swallow shows - small Zenker's diverticulum, mild intermittent tertiary contractions o f  teh distal third of the esophagus, mild relative narrowing of the distal esophagus consistent with a Schatzki's ring (which does not restrict the passage of a barium tablet). 6MWT - 270m/797ft  Plan - trial of ICS (Arnuity ellipta -28 day trial) - continue with  nasal corticosteroid - especially with high pollen days - if above is inconclusive with consider a HRCT at next visit (has a mild restriction and moderate decrease in PFTs).  - patient has follow up with GI for upper EGD and barium swallow study results.       Updated Medication List Outpatient Encounter Prescriptions as of 03/02/2015  Medication Sig  . aspirin 81 MG tablet Take 81 mg by mouth daily.  . benzonatate (TESSALON) 100 MG capsule Take 100 mg by mouth daily as needed.  . carvedilol (COREG) 25 MG tablet Take 12.5 mg by mouth 2 (two) times daily.  . clopidogrel (PLAVIX) 75 MG tablet Take 75 mg by mouth daily.  . hydrochlorothiazide (MICROZIDE) 12.5 MG capsule TAKE 1 CAPSULE (12.5 MG TOTAL) BY MOUTH DAILY.  Marland Kitchen insulin aspart (NOVOLOG) 100 UNIT/ML injection Inject 6 Units into the skin 3 (three) times daily before meals.  . isosorbide mononitrate (IMDUR) 60 MG 24 hr tablet Take 60 mg by mouth daily.  Marland Kitchen latanoprost (XALATAN) 0.005 % ophthalmic solution Place 1 drop into both eyes daily.  Marland Kitchen LORazepam (ATIVAN) 1 MG tablet Take 1 mg by mouth daily.  Marland Kitchen losartan (COZAAR) 100 MG tablet Take 100 mg by mouth daily.  . magnesium 30 MG tablet Take 30 mg by mouth daily.  . metFORMIN (GLUCOPHAGE) 1000 MG tablet Take 1,000 mg by mouth 2 (two) times daily.  . mometasone (NASONEX) 50 MCG/ACT nasal spray Place 2 sprays into the nose daily.  . Omega-3 Fatty Acids (FISH OIL) 1200 MG CAPS Take 1,200 mg by mouth daily.  . pantoprazole (PROTONIX) 40 MG tablet Take 40 mg by mouth daily.  Marland Kitchen PARoxetine (PAXIL) 20 MG tablet Take 20 mg by mouth daily.  . simvastatin (ZOCOR) 40 MG tablet Take 40 mg by mouth daily.  . mometasone (NASONEX) 50 MCG/ACT nasal spray Place 2 sprays into the nose daily.    Orders for this visit: No orders of the defined types were placed in this encounter.    Thank  you for the visitation and for allowing  [facility] to assist in the care of your patient. Our  recommendations are noted above.  Please contact us if we can be of further service.  Vilinda Boehringer, MD [facility] Office Number: [phone]

## 2015-03-03 DIAGNOSIS — H40063 Primary angle closure without glaucoma damage, bilateral: Secondary | ICD-10-CM | POA: Diagnosis not present

## 2015-03-04 ENCOUNTER — Telehealth: Payer: Self-pay | Admitting: *Deleted

## 2015-03-04 NOTE — Telephone Encounter (Signed)
Signed authorization to hold Plavix 5-7 days prior to EGD and restart 1 day post procedure faxed 03/02/15 to the [facility].

## 2015-03-09 DIAGNOSIS — Z794 Long term (current) use of insulin: Secondary | ICD-10-CM | POA: Diagnosis not present

## 2015-03-09 DIAGNOSIS — I10 Essential (primary) hypertension: Secondary | ICD-10-CM | POA: Diagnosis not present

## 2015-03-09 DIAGNOSIS — E1165 Type 2 diabetes mellitus with hyperglycemia: Secondary | ICD-10-CM | POA: Diagnosis not present

## 2015-03-09 DIAGNOSIS — E538 Deficiency of other specified B group vitamins: Secondary | ICD-10-CM | POA: Diagnosis not present

## 2015-03-10 ENCOUNTER — Ambulatory Visit: Payer: PRIVATE HEALTH INSURANCE

## 2015-03-12 ENCOUNTER — Other Ambulatory Visit: Payer: Self-pay | Admitting: *Deleted

## 2015-03-12 MED ORDER — FLUTICASONE FUROATE 100 MCG/ACT IN AEPB: 1.0000 | INHALATION_SPRAY | Freq: Every day | RESPIRATORY_TRACT | Status: DC

## 2015-03-12 NOTE — Progress Notes (Signed)
Patient received these samples that were already signed out to a patient who did not come for them in a timely manner.

## 2015-03-23 DIAGNOSIS — M79671 Pain in right foot: Secondary | ICD-10-CM | POA: Diagnosis not present

## 2015-03-23 DIAGNOSIS — M659 Synovitis and tenosynovitis, unspecified: Secondary | ICD-10-CM | POA: Diagnosis not present

## 2015-04-13 ENCOUNTER — Encounter: Payer: Self-pay | Admitting: Internal Medicine

## 2015-04-13 ENCOUNTER — Ambulatory Visit (INDEPENDENT_AMBULATORY_CARE_PROVIDER_SITE_OTHER): Payer: Medicare Other | Admitting: Internal Medicine

## 2015-04-13 VITALS — BP 106/68 | HR 50 | Temp 97.5°F | Ht 65.0 in | Wt 174.0 lb

## 2015-04-13 DIAGNOSIS — I2510 Atherosclerotic heart disease of native coronary artery without angina pectoris: Secondary | ICD-10-CM | POA: Diagnosis not present

## 2015-04-13 DIAGNOSIS — R053 Chronic cough: Secondary | ICD-10-CM

## 2015-04-13 DIAGNOSIS — R05 Cough: Secondary | ICD-10-CM

## 2015-04-13 NOTE — Progress Notes (Signed)
MRN# [id number] Laura Rojas Aug 26, 1946   CC: Chief Complaint  Patient presents with  . Follow-up    Pt here f/u cough and sob which have resolved.      Brief History: HPI 02/17/15: Patient is a pleasant 69 year old female presenting today for evaluation of chronic cough, over the past 10 years. Patient was recently seen at her primary care office for a repeat episode of worsening cough she was placed on antibiotics and Tessalon Perles, however this provided temporary relief. Per review of office note she had spirometry done in October 2015 which was normal at that time, her chest x-ray from October 2015 revealed diffuse nodular thickening of the pulmonary interstitium, suspected of bronchitis. Today patient states that she is doing fairly well, she does endorse a cough, dry mostly. Cough characteristics as follows: -Seasonal pattern - worst in fall, and sometimes in spring -Cough - usually not daily, "barking type," intermittent production of sputum which can be thick white or yellow.  -Has had a antibiotics in the past, followed with anti tussive -Has had episode of vomiting after cough -Cough spells can last about 3-5 mins, peppermint helps. Cough is usually worst at night also. Cough keeps her up at night at times.  -Has tried multiple OTC cough suppressant with little relief. Patient states that she has bronchitis episodes about 2 times a year, which last for weeks, usually treated with antibiotics and steroids. Never smoker. Had almost 45 years of second hand smoke exposure (between Father and husband).  Pet dog and cat.  Has tried over the counter reflux pills in the past without help.  PLAN - pfts, 59mwt, trial of nasal steriod   ROV 03/02/15: Patient presents today for a follow up visit of her cough along with PFTs, 61mwt.  Patient stated that nasal steroid (nasonex) helped, but it was $100 per bottle, she is still using it, states that she is not coughing as much. Since  her last visit her PMD also ordered a barium swallow for her, which she has completed.  Overall today patient states that she is doing fairly well, still with chronic cough, but noted that it is a high allergy season for her. Patient had a pulmonary function testing done today, off note, she was having some moderate coughing after administration of albuterol which subsided throughout the visit.  Plan -ICS trial, f/u with GI for EGD and eval of ? Schz rings  Events since last clinic visit: Cough is much improved, not as intense as before. Still with mild dry cough, but with no wheezing. Has EGD scheduled for 04/27/15. Using Arunity Ellipta (ICS) on a regular basis, with great improvement, and still with nasal steroid daily (for allergy control).  Overall with much improvement.        Medication:   Current Outpatient Rx  Name  Route  Sig  Dispense  Refill  . aspirin 81 MG tablet   Oral   Take 81 mg by mouth daily.         . carvedilol (COREG) 25 MG tablet   Oral   Take 12.5 mg by mouth 2 (two) times daily.         . clopidogrel (PLAVIX) 75 MG tablet   Oral   Take 75 mg by mouth daily.         . Fluticasone Furoate (ARNUITY ELLIPTA) 100 MCG/ACT AEPB   Inhalation   Inhale 1 puff into the lungs daily. Rinse and gargle after each use.   Gumlog  each   0   . hydrochlorothiazide (MICROZIDE) 12.5 MG capsule      TAKE 1 CAPSULE (12.5 MG TOTAL) BY MOUTH DAILY.   90 capsule   3   . insulin aspart (NOVOLOG) 100 UNIT/ML injection   Subcutaneous   Inject 6 Units into the skin 3 (three) times daily before meals.         . isosorbide mononitrate (IMDUR) 60 MG 24 hr tablet   Oral   Take 60 mg by mouth daily.      4   . latanoprost (XALATAN) 0.005 % ophthalmic solution   Both Eyes   Place 1 drop into both eyes at bedtime.          Marland Kitchen LORazepam (ATIVAN) 1 MG tablet   Oral   Take 1 mg by mouth daily.         Marland Kitchen losartan (COZAAR) 100 MG tablet   Oral   Take 100 mg by  mouth daily.         . magnesium 30 MG tablet   Oral   Take 30 mg by mouth daily.         . metFORMIN (GLUCOPHAGE) 1000 MG tablet   Oral   Take 1,000 mg by mouth 2 (two) times daily.         . mometasone (NASONEX) 50 MCG/ACT nasal spray   Nasal   Place 2 sprays into the nose daily.   17 g   0   . mometasone (NASONEX) 50 MCG/ACT nasal spray   Nasal   Place 2 sprays into the nose daily.   51 g   0   . Omega-3 Fatty Acids (FISH OIL) 1200 MG CAPS   Oral   Take 1,200 mg by mouth daily.         . pantoprazole (PROTONIX) 40 MG tablet   Oral   Take 40 mg by mouth daily.         Marland Kitchen PARoxetine (PAXIL) 20 MG tablet   Oral   Take 20 mg by mouth daily.         . simvastatin (ZOCOR) 40 MG tablet   Oral   Take 40 mg by mouth daily.            Review of Systems: Gen:  Denies  fever, sweats, chills HEENT: Denies blurred vision, double vision, ear pain, eye pain, hearing loss, nose bleeds, sore throat Cvc:  No dizziness, chest pain or heaviness Resp:   Admits ZS:WFUX dry nonproductive cough Gi: Denies swallowing difficulty, stomach pain, nausea or vomiting, diarrhea, constipation, bowel incontinence Gu:  Denies bladder incontinence, burning urine Ext:   No Joint pain, stiffness or swelling Skin: No skin rash, easy bruising or bleeding or hives Endoc:  No polyuria, polydipsia , polyphagia or weight change Other:  All other systems negative  Allergies:  Compazine; Reglan; Sulfa antibiotics; and Latex  Physical Examination:  VS: BP 106/68 mmHg  Pulse 50  Wt 174 lb (78.926 kg)  SpO2 96%  General Appearance: No distress  HEENT: PERRLA, no ptosis, no other lesions noticed Pulmonary:normal breath sounds., diaphragmatic excursion normal.No wheezing, No rales   Cardiovascular:  Normal S1,S2.  No m/r/g.     Abdomen:Exam: Benign, Soft, non-tender, No masses  Skin:   warm, no rashes, no ecchymosis  Extremities: normal, no cyanosis, clubbing, warm with normal capillary  refill.      Pulmonary function testing 03/02/2015 FVC 69%  FEV1 76%  FEV1/FVC 84%  FVC 69%  RV 102%  TLC 86%  RV/TLC 119%  ERV 60%  DLCO uncorrected 55% Impression: Mild restriction noted with severe decreased ERV; abdominal wall obesity/body habitus can lead to this value. Moderate decrease in DLCO.  6 minute walk test: 243 m/7097 feet, no saturation 90%, highest heart rate 62.     Assessment and Plan:Daniel female presenting today for followup visit of chronic cough, with moderate improvement.  Chronic cough The standardized cough guidelines published in Chest by Lissa Morales in 2006 are still the best available and consist of a multiple step process (up to 12!) , not a single office visit,  and are intended  to address this problem logically,  with an alogrithm dependent on response to empiric treatment at  each progressive step  to determine a specific diagnosis with  minimal addtional testing needed. Therefore if adherence is an issue or can't be accurately verified,  it's very unlikely the standard evaluation and treatment will be successful here.    Furthermore, response to therapy (other than acute cough suppression, which should only be used short term with avoidance of narcotic containing cough syrups if possible), can be a gradual process for which the patient may perceive immediate benefit.  I suspect that her chronic cough is multi-factorial: differential as stated below Differential diagnosis includes - UACS, suspected ashtma There is benefit from nasal steroid, her PFTs today shows mild restriction (FEV1/FVC =84, FEV1=76), ERV 16, SVC 69, DLCO 55.  Patient noted to have inc cough after administration of albuterol for pfts, which dissipated at the end of the visit.  In addition her recent barium swallow shows - small Zenker's diverticulum, mild intermittent tertiary contractions o f teh distal third of the esophagus, mild relative narrowing of the distal esophagus  consistent with a Schatzki's ring (which does not restrict the passage of a barium tablet). 6MWT - 255m/797ft  Plan - cont with inhaled corticosteroid (Arnuity Ellipta, 170mcg) for Asthma - take 1 puff once a day - gargle and rinse after each use - continue with nasal steroid - especially with high pollen days.         Updated Medication List Outpatient Encounter Prescriptions as of 04/13/2015  Medication Sig  . aspirin 81 MG tablet Take 81 mg by mouth daily.  . carvedilol (COREG) 25 MG tablet Take 12.5 mg by mouth 2 (two) times daily.  . clopidogrel (PLAVIX) 75 MG tablet Take 75 mg by mouth daily.  . Fluticasone Furoate (ARNUITY ELLIPTA) 100 MCG/ACT AEPB Inhale 1 puff into the lungs daily. Rinse and gargle after each use.  . hydrochlorothiazide (MICROZIDE) 12.5 MG capsule TAKE 1 CAPSULE (12.5 MG TOTAL) BY MOUTH DAILY.  Marland Kitchen insulin aspart (NOVOLOG) 100 UNIT/ML injection Inject 6 Units into the skin 3 (three) times daily before meals.  . isosorbide mononitrate (IMDUR) 60 MG 24 hr tablet Take 60 mg by mouth daily.  Marland Kitchen latanoprost (XALATAN) 0.005 % ophthalmic solution Place 1 drop into both eyes at bedtime.   Marland Kitchen LORazepam (ATIVAN) 1 MG tablet Take 1 mg by mouth daily.  Marland Kitchen losartan (COZAAR) 100 MG tablet Take 100 mg by mouth daily.  . magnesium 30 MG tablet Take 30 mg by mouth daily.  . metFORMIN (GLUCOPHAGE) 1000 MG tablet Take 1,000 mg by mouth 2 (two) times daily.  . mometasone (NASONEX) 50 MCG/ACT nasal spray Place 2 sprays into the nose daily.  . mometasone (NASONEX) 50 MCG/ACT nasal spray Place 2 sprays into the nose daily.  . Omega-3 Fatty Acids (FISH OIL)  1200 MG CAPS Take 1,200 mg by mouth daily.  . pantoprazole (PROTONIX) 40 MG tablet Take 40 mg by mouth daily.  Marland Kitchen PARoxetine (PAXIL) 20 MG tablet Take 20 mg by mouth daily.  . simvastatin (ZOCOR) 40 MG tablet Take 40 mg by mouth daily.  . [DISCONTINUED] benzonatate (TESSALON) 100 MG capsule Take 100 mg by mouth daily as needed.   No  facility-administered encounter medications on file as of 04/13/2015.    Orders for this visit: No orders of the defined types were placed in this encounter.    Thank  you for the visitation and for allowing  [facility] to assist in the care of your patient. Our recommendations are noted above.  Please contact us if we can be of further service.  Vilinda Boehringer, MD [facility] Office Number: [phone]

## 2015-04-13 NOTE — Patient Instructions (Signed)
Follow up in 3 months with Dr. Stevenson Clinch - cont with inhaled corticosteroid (Arnuity Ellipta, 136mcg) for Asthma - take 1 puff once a day - gargle and rinse after each use - continue with nasal steroid - especially with high pollen days.

## 2015-04-27 ENCOUNTER — Encounter
Admission: RE | Disposition: A | Discharge status: Home / Self Care | Payer: Self-pay | Source: Ambulatory Visit | Attending: Gastroenterology

## 2015-04-27 ENCOUNTER — Ambulatory Visit: Payer: Medicare Other | Admitting: Anesthesiology

## 2015-04-27 ENCOUNTER — Ambulatory Visit
Admission: RE | Admit: 2015-04-27 | Discharge: 2015-04-27 | Disposition: A | Discharge status: Home / Self Care | Payer: Medicare Other | Source: Ambulatory Visit | Attending: Gastroenterology | Admitting: Gastroenterology

## 2015-04-27 DIAGNOSIS — K219 Gastro-esophageal reflux disease without esophagitis: Secondary | ICD-10-CM | POA: Insufficient documentation

## 2015-04-27 DIAGNOSIS — E119 Type 2 diabetes mellitus without complications: Secondary | ICD-10-CM | POA: Diagnosis not present

## 2015-04-27 DIAGNOSIS — K2970 Gastritis, unspecified, without bleeding: Secondary | ICD-10-CM | POA: Diagnosis not present

## 2015-04-27 DIAGNOSIS — Z794 Long term (current) use of insulin: Secondary | ICD-10-CM | POA: Insufficient documentation

## 2015-04-27 DIAGNOSIS — K222 Esophageal obstruction: Secondary | ICD-10-CM | POA: Diagnosis not present

## 2015-04-27 DIAGNOSIS — K2950 Unspecified chronic gastritis without bleeding: Secondary | ICD-10-CM | POA: Insufficient documentation

## 2015-04-27 DIAGNOSIS — Z7982 Long term (current) use of aspirin: Secondary | ICD-10-CM | POA: Insufficient documentation

## 2015-04-27 DIAGNOSIS — Z955 Presence of coronary angioplasty implant and graft: Secondary | ICD-10-CM | POA: Diagnosis not present

## 2015-04-27 DIAGNOSIS — I739 Peripheral vascular disease, unspecified: Secondary | ICD-10-CM | POA: Insufficient documentation

## 2015-04-27 DIAGNOSIS — R1110 Vomiting, unspecified: Secondary | ICD-10-CM | POA: Diagnosis not present

## 2015-04-27 DIAGNOSIS — I2510 Atherosclerotic heart disease of native coronary artery without angina pectoris: Secondary | ICD-10-CM | POA: Insufficient documentation

## 2015-04-27 DIAGNOSIS — Z882 Allergy status to sulfonamides status: Secondary | ICD-10-CM | POA: Insufficient documentation

## 2015-04-27 DIAGNOSIS — Z79899 Other long term (current) drug therapy: Secondary | ICD-10-CM | POA: Insufficient documentation

## 2015-04-27 DIAGNOSIS — E785 Hyperlipidemia, unspecified: Secondary | ICD-10-CM | POA: Diagnosis not present

## 2015-04-27 DIAGNOSIS — K30 Functional dyspepsia: Secondary | ICD-10-CM | POA: Insufficient documentation

## 2015-04-27 DIAGNOSIS — R1310 Dysphagia, unspecified: Secondary | ICD-10-CM | POA: Diagnosis not present

## 2015-04-27 DIAGNOSIS — K317 Polyp of stomach and duodenum: Secondary | ICD-10-CM | POA: Insufficient documentation

## 2015-04-27 DIAGNOSIS — D131 Benign neoplasm of stomach: Secondary | ICD-10-CM | POA: Diagnosis not present

## 2015-04-27 DIAGNOSIS — I10 Essential (primary) hypertension: Secondary | ICD-10-CM | POA: Diagnosis not present

## 2015-04-27 DIAGNOSIS — R1013 Epigastric pain: Secondary | ICD-10-CM | POA: Diagnosis present

## 2015-04-27 HISTORY — PX: ESOPHAGOGASTRODUODENOSCOPY: SHX5428

## 2015-04-27 LAB — GLUCOSE, CAPILLARY: GLUCOSE-CAPILLARY: 190 mg/dL — AB (ref 65–99)

## 2015-04-27 SURGERY — EGD (ESOPHAGOGASTRODUODENOSCOPY)
Anesthesia: General

## 2015-04-27 MED ORDER — FENTANYL CITRATE (PF) 100 MCG/2ML IJ SOLN
INTRAMUSCULAR | Status: DC | PRN
  Administered 2015-04-27: 50 ug via INTRAVENOUS

## 2015-04-27 MED ORDER — PROPOFOL INFUSION 10 MG/ML OPTIME
INTRAVENOUS | Status: DC | PRN
  Administered 2015-04-27: 120 ug/kg/min via INTRAVENOUS

## 2015-04-27 MED ORDER — GLYCOPYRROLATE 0.2 MG/ML IJ SOLN
INTRAMUSCULAR | Status: DC | PRN
  Administered 2015-04-27: 0.1 mg via INTRAVENOUS

## 2015-04-27 MED ORDER — SODIUM CHLORIDE 0.9 % IV SOLN
INTRAVENOUS | Status: DC
Start: 2015-04-27 — End: 2015-04-27
  Administered 2015-04-27: 08:00:00 via INTRAVENOUS
  Administered 2015-04-27: 1000 mL via INTRAVENOUS

## 2015-04-27 MED ORDER — LIDOCAINE HCL (CARDIAC) 20 MG/ML IV SOLN
INTRAVENOUS | Status: DC | PRN
  Administered 2015-04-27: 50 mg via INTRAVENOUS

## 2015-04-27 MED ORDER — MIDAZOLAM HCL 2 MG/2ML IJ SOLN
INTRAMUSCULAR | Status: DC | PRN
  Administered 2015-04-27: 2 mg via INTRAVENOUS

## 2015-04-27 NOTE — Anesthesia Procedure Notes (Signed)
Performed by: Vaughan Sine Pre-anesthesia Checklist: Patient identified, Emergency Drugs available, Suction available, Patient being monitored and Timeout performed Patient Re-evaluated:Patient Re-evaluated prior to inductionOxygen Delivery Method: Nasal cannula Intubation Type: IV induction Airway Equipment and Method: Bite block Placement Confirmation: positive ETCO2

## 2015-04-27 NOTE — Anesthesia Preprocedure Evaluation (Addendum)
Anesthesia Evaluation  Patient identified by MRN, date of birth, ID band Patient awake    Reviewed: Allergy & Precautions, NPO status , Patient's Chart, lab work & pertinent test results, reviewed documented beta blocker date and time   Airway Mallampati: II  TM Distance: >3 FB     Dental  (+) Chipped   Pulmonary          Cardiovascular hypertension, + CAD and + Peripheral Vascular Disease     Neuro/Psych    GI/Hepatic   Endo/Other  diabetes  Renal/GU      Musculoskeletal   Abdominal   Peds  Hematology   Anesthesia Other Findings   Reproductive/Obstetrics                            Anesthesia Physical Anesthesia Plan  ASA: III  Anesthesia Plan: General   Post-op Pain Management:    Induction: Intravenous  Airway Management Planned: Nasal Cannula  Additional Equipment:   Intra-op Plan:   Post-operative Plan:   Informed Consent: I have reviewed the patients History and Physical, chart, labs and discussed the procedure including the risks, benefits and alternatives for the proposed anesthesia with the patient or authorized representative who has indicated his/her understanding and acceptance.     Plan Discussed with:   Anesthesia Plan Comments:         Anesthesia Quick Evaluation

## 2015-04-27 NOTE — Op Note (Signed)
[facility] Patient Name: Laura Rojas Procedure Date: 04/27/2015 8:22 AM MRN: [id number] Account #: [id number] Date of Birth: 04/28/1946 Admit Type: Outpatient Age: 69 Room: [facility] ENDO ROOM 4 Gender: Female Note Status: Finalized Procedure:         Upper GI endoscopy Indications:       Dyspepsia, Regurgitation Providers:         Lucilla Lame, MD Medicines:         Propofol per Anesthesia Complications:     No immediate complications. Procedure:         Pre-Anesthesia Assessment:                    - Prior to the procedure, a History and Physical was                     performed, and patient medications and allergies were                     reviewed. The patient's tolerance of previous anesthesia                     was also reviewed. The risks and benefits of the procedure                     and the sedation options and risks were discussed with the                     patient. All questions were answered, and informed consent                     was obtained. Prior Anticoagulants: The patient has taken                     no previous anticoagulant or antiplatelet agents. ASA                     Grade Assessment: II - A patient with mild systemic                     disease. After reviewing the risks and benefits, the                     patient was deemed in satisfactory condition to undergo                     the procedure.                    After obtaining informed consent, the endoscope was passed                     under direct vision. Throughout the procedure, the                     patient's blood pressure, pulse, and oxygen saturations                     were monitored continuously. The Endoscope was introduced                     through the mouth, and advanced to the second part of                     duodenum. The upper GI endoscopy was accomplished  without                     difficulty. The patient tolerated the procedure  well. Findings:      A benign-appearing, intrinsic mild stenosis was found at the       gastroesophageal junction and was traversed. A TTS dilator was passed       through the scope. Dilation with a 15-16.5-18 mm balloon (to a maximum       balloon size of 18 mm) dilator was performed. The dilation site was       examined following endoscope reinsertion and showed complete resolution       of luminal narrowing.      Localized mild inflammation characterized by erythema was found in the       gastric antrum. Biopsies were taken with a cold forceps for histology.      A single 6 mm pedunculated polyp with no bleeding and no stigmata of       recent bleeding was found in the gastric body. The polyp was removed       with a cold biopsy forceps. Resection and retrieval were complete.      The examined duodenum was normal. Impression:        - Benign-appearing esophageal stricture. Dilated.                    - Gastritis. Biopsied.                    - A single gastric polyp. Resected and retrieved.                    - Normal examined duodenum.                    - The Zankars was not seen. Recommendation:    - Await pathology results. Procedure Code(s): --- Professional ---                    [phone], Esophagogastroduodenoscopy, flexible, transoral;                     with transendoscopic balloon dilation of esophagus (less                     than 30 mm diameter)                    43239, Esophagogastroduodenoscopy, flexible, transoral;                     with biopsy, single or multiple Diagnosis Code(s): --- Professional ---                    K22.2, Esophageal obstruction                    K29.70, Gastritis, unspecified, without bleeding                    K31.7, Polyp of stomach and duodenum                    K30, Functional dyspepsia                    R11.10, Vomiting, unspecified CPT copyright 2014 American Medical Association. All rights reserved. The codes documented in this  report are preliminary and upon coder review  may  be revised to meet current compliance requirements. Lucilla Lame, MD 04/27/2015 8:41:26 AM This report has been signed electronically. Number of Addenda: 0 Note Initiated On: 04/27/2015 8:22 AM      [facility]

## 2015-04-27 NOTE — Anesthesia Postprocedure Evaluation (Signed)
  Anesthesia Post-op Note  Patient: Laura Rojas  Procedure(s) Performed: Procedure(s): ESOPHAGOGASTRODUODENOSCOPY (EGD) (N/A)  Anesthesia type:No value filed.  Patient location: PACU  Post pain: Pain level controlled  Post assessment: Post-op Vital signs reviewed, Patient's Cardiovascular Status Stable, Respiratory Function Stable, Patent Airway and No signs of Nausea or vomiting  Post vital signs: Reviewed and stable  Last Vitals:  Filed Vitals:   04/27/15 0802  BP: 116/61  Pulse: 50  Temp: 36.6 C  Resp: 17    Level of consciousness: awake, alert  and patient cooperative  Complications: No apparent anesthesia complications

## 2015-04-27 NOTE — Transfer of Care (Signed)
Immediate Anesthesia Transfer of Care Note  Patient: Laura Rojas  Procedure(s) Performed: Procedure(s): ESOPHAGOGASTRODUODENOSCOPY (EGD) (N/A)  Patient Location: PACU  Anesthesia Type:General  Level of Consciousness: awake, alert  and oriented  Airway & Oxygen Therapy: Patient Spontanous Breathing and Patient connected to nasal cannula oxygen  Post-op Assessment: Report given to RN and Post -op Vital signs reviewed and stable  Post vital signs: Reviewed and stable  Last Vitals:  Filed Vitals:   04/27/15 0802  BP: 116/61  Pulse: 50  Temp: 36.6 C  Resp: 17    Complications: No apparent anesthesia complications

## 2015-04-27 NOTE — H&P (Signed)
[facility]  [address] [place], [place] [address] Phone: [phone] Fax : [phone]  Primary Care Physician:  Golden Pop, MD Primary Gastroenterologist:  Dr. Allen Norris  Pre-Procedure History & Physical: HPI:  Laura Rojas is a 69 y.o. female is here for an endoscopy.   Past Medical History  Diagnosis Date  . CAD (coronary artery disease)   . DM (diabetes mellitus)   . Dyslipidemia   . Hypertension   . CAD (coronary artery disease) 12/31/2013    2007, 3.0 x 28 mm drug-eluting Cypher stent to RCA, 3.5 x 33 mm drug-eluting Cypher stent and proximal LAD) Consent by 2009 Normal perfusion by mildly 2013   . Subclavian artery stenosis, left 01/01/2015    Past Surgical History  Procedure Laterality Date  . Replacement total knee  2003 & 2004  . Vaginal hysterectomy  1983  . Breast biopsy  1970    right  . Breast biopsy  1973    left  . Coronary angioplasty with stent placement  09/17/2006    PTCA & stenting LAD  . Cardiac catheterization  08/25/2008    patent stents    Prior to Admission medications   Medication Sig Start Date End Date Taking? Authorizing Provider  aspirin 81 MG tablet Take 81 mg by mouth daily.   Yes Historical Provider, MD  carvedilol (COREG) 25 MG tablet Take 12.5 mg by mouth 2 (two) times daily. 09/24/13  Yes Historical Provider, MD  clopidogrel (PLAVIX) 75 MG tablet Take 75 mg by mouth daily. 10/25/13  Yes Historical Provider, MD  Fluticasone Furoate (ARNUITY ELLIPTA) 100 MCG/ACT AEPB Inhale 1 puff into the lungs daily. Rinse and gargle after each use. 03/12/15  Yes Vishal Mungal, MD  hydrochlorothiazide (MICROZIDE) 12.5 MG capsule TAKE 1 CAPSULE (12.5 MG TOTAL) BY MOUTH DAILY. 02/22/15  Yes Mihai Croitoru, MD  insulin aspart (NOVOLOG) 100 UNIT/ML injection Inject 6 Units into the skin 3 (three) times daily before meals.   Yes Historical Provider, MD  isosorbide mononitrate (IMDUR) 60 MG 24 hr tablet Take 60 mg by mouth daily. 01/02/15   Yes Historical Provider, MD  latanoprost (XALATAN) 0.005 % ophthalmic solution Place 1 drop into both eyes at bedtime.  12/08/13  Yes Historical Provider, MD  LORazepam (ATIVAN) 1 MG tablet Take 1 mg by mouth daily. 12/16/13  Yes Historical Provider, MD  losartan (COZAAR) 100 MG tablet Take 100 mg by mouth daily. 12/17/13  Yes Historical Provider, MD  magnesium 30 MG tablet Take 30 mg by mouth daily.   Yes Historical Provider, MD  metFORMIN (GLUCOPHAGE) 1000 MG tablet Take 1,000 mg by mouth 2 (two) times daily. 12/17/13  Yes Historical Provider, MD  mometasone (NASONEX) 50 MCG/ACT nasal spray Place 2 sprays into the nose daily. 03/02/15  Yes Vishal Mungal, MD  Omega-3 Fatty Acids (FISH OIL) 1200 MG CAPS Take 1,200 mg by mouth daily.   Yes Historical Provider, MD  pantoprazole (PROTONIX) 40 MG tablet Take 40 mg by mouth daily. 11/10/13  Yes Historical Provider, MD  PARoxetine (PAXIL) 20 MG tablet Take 20 mg by mouth daily.   Yes Historical Provider, MD  simvastatin (ZOCOR) 40 MG tablet Take 40 mg by mouth daily. 09/24/13  Yes Historical Provider, MD  mometasone (NASONEX) 50 MCG/ACT nasal spray Place 2 sprays into the nose daily. 02/17/15 02/17/16  Vilinda Boehringer, MD    Allergies as of 03/15/2015 - Review Complete 03/02/2015  Allergen Reaction Noted  . Compazine [prochlorperazine edisylate]  12/31/2013  .  Reglan [metoclopramide]  12/31/2013  . Sulfa antibiotics  12/31/2013  . Latex Rash 12/31/2013    Family History  Problem Relation Age of Onset  . Cancer Mother   . Diabetes Sister   . Cancer Sister     History   Social History  . Marital Status: Married    Spouse Name: N/A  . Number of Children: N/A  . Years of Education: N/A   Occupational History  . Not on file.   Social History Main Topics  . Smoking status: Never Smoker   . Smokeless tobacco: Never Used  . Alcohol Use: No  . Drug Use: No  . Sexual Activity: Not on file   Other Topics Concern  . Not on file   Social History  Narrative    Review of Systems: See HPI, otherwise negative ROS  Physical Exam: BP 116/61 mmHg  Pulse 50  Temp(Src) 97.8 F (36.6 C) (Tympanic)  Resp 17  SpO2 99% General:   Alert,  pleasant and cooperative in NAD Head:  Normocephalic and atraumatic. Neck:  Supple; no masses or thyromegaly. Lungs:  Clear throughout to auscultation.    Heart:  Regular rate and rhythm. Abdomen:  Soft, nontender and nondistended. Normal bowel sounds, without guarding, and without rebound.   Neurologic:  Alert and  oriented x4;  grossly normal neurologically.  Impression/Plan: Laura Rojas is here for an endoscopy to be performed for Dyspepsia  Risks, benefits, limitations, and alternatives regarding  endoscopy have been reviewed with the patient.  Questions have been answered.  All parties agreeable.   Ollen Bowl, MD  04/27/2015, 8:28 AM

## 2015-04-27 NOTE — Anesthesia Postprocedure Evaluation (Signed)
  Anesthesia Post-op Note  Patient: Laura Rojas  Procedure(s) Performed: Procedure(s): ESOPHAGOGASTRODUODENOSCOPY (EGD) (N/A)  Anesthesia type:No value filed.  Patient location: PACU  Post pain: Pain level controlled  Post assessment: Post-op Vital signs reviewed, Patient's Cardiovascular Status Stable, Respiratory Function Stable, Patent Airway and No signs of Nausea or vomiting  Post vital signs: Reviewed and stable  Last Vitals:  Filed Vitals:   04/27/15 0920  BP: 112/67  Pulse: 54  Temp:   Resp: 15    Level of consciousness: awake, alert  and patient cooperative  Complications: No apparent anesthesia complications

## 2015-04-28 LAB — SURGICAL PATHOLOGY

## 2015-04-29 ENCOUNTER — Encounter: Payer: Self-pay | Admitting: Gastroenterology

## 2015-05-03 NOTE — Assessment & Plan Note (Signed)
The standardized cough guidelines published in Chest by Lissa Morales in 2006 are still the best available and consist of a multiple step process (up to 12!) , not a single office visit,  and are intended  to address this problem logically,  with an alogrithm dependent on response to empiric treatment at  each progressive step  to determine a specific diagnosis with  minimal addtional testing needed. Therefore if adherence is an issue or can't be accurately verified,  it's very unlikely the standard evaluation and treatment will be successful here.    Furthermore, response to therapy (other than acute cough suppression, which should only be used short term with avoidance of narcotic containing cough syrups if possible), can be a gradual process for which the patient may perceive immediate benefit.  I suspect that her chronic cough is multi-factorial: differential as stated below Differential diagnosis includes - UACS, suspected ashtma There is benefit from nasal steroid, her PFTs today shows mild restriction (FEV1/FVC =84, FEV1=76), ERV 16, SVC 69, DLCO 55.  Patient noted to have inc cough after administration of albuterol for pfts, which dissipated at the end of the visit.  In addition her recent barium swallow shows - small Zenker's diverticulum, mild intermittent tertiary contractions o f teh distal third of the esophagus, mild relative narrowing of the distal esophagus consistent with a Schatzki's ring (which does not restrict the passage of a barium tablet). 6MWT - 231m/797ft  Plan - cont with inhaled corticosteroid (Arnuity Ellipta, 153mcg) for Asthma - take 1 puff once a day - gargle and rinse after each use - continue with nasal steroid - especially with high pollen days.

## 2015-05-05 ENCOUNTER — Telehealth: Payer: Self-pay

## 2015-05-05 NOTE — Telephone Encounter (Signed)
-----   Message from Lucilla Lame, MD sent at 04/30/2015 11:25 AM EDT ----- That the patient know the gastric polyp was benign. The patient also had inflammation in the stomach without any sign of infection.

## 2015-05-05 NOTE — Telephone Encounter (Signed)
Pt notified of results

## 2015-05-20 ENCOUNTER — Encounter: Payer: Self-pay | Admitting: *Deleted

## 2015-05-24 DIAGNOSIS — H40063 Primary angle closure without glaucoma damage, bilateral: Secondary | ICD-10-CM | POA: Diagnosis not present

## 2015-05-26 ENCOUNTER — Ambulatory Visit (INDEPENDENT_AMBULATORY_CARE_PROVIDER_SITE_OTHER): Payer: Medicare Other | Admitting: Family Medicine

## 2015-05-26 ENCOUNTER — Encounter: Payer: Self-pay | Admitting: Family Medicine

## 2015-05-26 VITALS — BP 109/70 | HR 57 | Temp 98.6°F | Ht 63.2 in | Wt 174.0 lb

## 2015-05-26 DIAGNOSIS — Z0000 Encounter for general adult medical examination without abnormal findings: Secondary | ICD-10-CM | POA: Diagnosis not present

## 2015-05-26 DIAGNOSIS — I10 Essential (primary) hypertension: Secondary | ICD-10-CM | POA: Diagnosis not present

## 2015-05-26 DIAGNOSIS — I2510 Atherosclerotic heart disease of native coronary artery without angina pectoris: Secondary | ICD-10-CM | POA: Diagnosis not present

## 2015-05-26 DIAGNOSIS — E785 Hyperlipidemia, unspecified: Secondary | ICD-10-CM | POA: Diagnosis not present

## 2015-05-26 DIAGNOSIS — I2583 Coronary atherosclerosis due to lipid rich plaque: Secondary | ICD-10-CM

## 2015-05-26 DIAGNOSIS — F419 Anxiety disorder, unspecified: Secondary | ICD-10-CM | POA: Insufficient documentation

## 2015-05-26 DIAGNOSIS — E118 Type 2 diabetes mellitus with unspecified complications: Secondary | ICD-10-CM | POA: Diagnosis not present

## 2015-05-26 DIAGNOSIS — Z1239 Encounter for other screening for malignant neoplasm of breast: Secondary | ICD-10-CM

## 2015-05-26 DIAGNOSIS — R053 Chronic cough: Secondary | ICD-10-CM

## 2015-05-26 DIAGNOSIS — F413 Other mixed anxiety disorders: Secondary | ICD-10-CM

## 2015-05-26 DIAGNOSIS — R05 Cough: Secondary | ICD-10-CM

## 2015-05-26 LAB — URINALYSIS, ROUTINE W REFLEX MICROSCOPIC
Bilirubin, UA: NEGATIVE
Glucose, UA: NEGATIVE
Ketones, UA: NEGATIVE
Nitrite, UA: NEGATIVE
PH UA: 6.5 (ref 5.0–7.5)
Protein, UA: NEGATIVE
RBC UA: NEGATIVE
Specific Gravity, UA: 1.02 (ref 1.005–1.030)
Urobilinogen, Ur: 0.2 mg/dL (ref 0.2–1.0)

## 2015-05-26 LAB — MICROSCOPIC EXAMINATION: RBC, UA: NONE SEEN /hpf (ref 0–?)

## 2015-05-26 MED ORDER — HYDROCHLOROTHIAZIDE 12.5 MG PO CAPS: 12.5000 mg | ORAL_CAPSULE | Freq: Every day | ORAL | Status: DC

## 2015-05-26 MED ORDER — MOMETASONE FUROATE 50 MCG/ACT NA SUSP: 2.0000 | Freq: Every day | NASAL | Status: DC

## 2015-05-26 MED ORDER — PAROXETINE HCL 20 MG PO TABS: 20.0000 mg | ORAL_TABLET | Freq: Every day | ORAL | Status: DC

## 2015-05-26 MED ORDER — CLOPIDOGREL BISULFATE 75 MG PO TABS
75.0000 mg | ORAL_TABLET | Freq: Every day | ORAL | Status: DC
Start: 2015-05-26 — End: 2016-07-05

## 2015-05-26 MED ORDER — PANTOPRAZOLE SODIUM 40 MG PO TBEC
40.0000 mg | DELAYED_RELEASE_TABLET | Freq: Every day | ORAL | Status: DC
Start: 2015-05-26 — End: 2016-06-07

## 2015-05-26 MED ORDER — CARVEDILOL 12.5 MG PO TABS: 12.5000 mg | ORAL_TABLET | Freq: Two times a day (BID) | ORAL | Status: DC

## 2015-05-26 MED ORDER — AMLODIPINE BESYLATE 5 MG PO TABS: 5.0000 mg | ORAL_TABLET | Freq: Every day | ORAL | Status: DC

## 2015-05-26 MED ORDER — LOSARTAN POTASSIUM 100 MG PO TABS: 100.0000 mg | ORAL_TABLET | Freq: Every day | ORAL | Status: DC

## 2015-05-26 MED ORDER — SIMVASTATIN 40 MG PO TABS
40.0000 mg | ORAL_TABLET | Freq: Every day | ORAL | Status: DC
Start: 2015-05-26 — End: 2016-06-07

## 2015-05-26 MED ORDER — LORAZEPAM 1 MG PO TABS: 1.0000 mg | ORAL_TABLET | Freq: Every day | ORAL | Status: DC | PRN

## 2015-05-26 NOTE — Assessment & Plan Note (Signed)
The current medical regimen is effective;  continue present plan and medications.  

## 2015-05-26 NOTE — Assessment & Plan Note (Signed)
Better with inhaler

## 2015-05-26 NOTE — Addendum Note (Signed)
Addended byGolden Pop on: 05/26/2015 03:45 PM   Modules accepted: Orders, SmartSet

## 2015-05-26 NOTE — Assessment & Plan Note (Signed)
Followed at endocrine

## 2015-05-26 NOTE — Assessment & Plan Note (Signed)
lorazepam for anxiety takes every day

## 2015-05-26 NOTE — Progress Notes (Signed)
BP 109/70 mmHg  Pulse 57  Temp(Src) 98.6 F (37 C)  Ht 5' 3.2" (1.605 m)  Wt 174 lb (78.926 kg)  BMI 30.64 kg/m2  SpO2 99%   Subjective:    Patient ID: Laura Rojas, female    DOB: 07-28-1946, 69 y.o.   MRN: [id number]  HPI: Laura Rojas is a 69 y.o. female  Chief Complaint  Patient presents with  . Annual Exam  AWV computer issues Fall risk depression etc done DM followed Dr  Eddie Dibbles follows Depression  Lipids All meds doing well No side effects Takes every day   Relevant past medical, surgical, family and social history reviewed and updated as indicated. Interim medical history since our last visit reviewed. Allergies and medications reviewed and updated.  Review of Systems  Constitutional: Negative.   HENT: Negative.   Eyes: Negative.   Respiratory: Negative.   Cardiovascular: Negative.   Gastrointestinal: Negative.   Endocrine: Negative.   Genitourinary: Negative.   Musculoskeletal: Negative.   Skin: Negative.   Allergic/Immunologic: Negative.   Neurological: Negative.   Hematological: Negative.   Psychiatric/Behavioral: Negative.     Per HPI unless specifically indicated above     Objective:    BP 109/70 mmHg  Pulse 57  Temp(Src) 98.6 F (37 C)  Ht 5' 3.2" (1.605 m)  Wt 174 lb (78.926 kg)  BMI 30.64 kg/m2  SpO2 99%  Wt Readings from Last 3 Encounters:  05/26/15 174 lb (78.926 kg)  02/04/15 169 lb (76.658 kg)  04/13/15 174 lb (78.926 kg)    Physical Exam  Constitutional: She is oriented to person, place, and time. She appears well-developed and well-nourished.  HENT:  Head: Normocephalic and atraumatic.  Right Ear: External ear normal.  Left Ear: External ear normal.  Nose: Nose normal.  Mouth/Throat: Oropharynx is clear and moist.  Eyes: Conjunctivae and EOM are normal. Pupils are equal, round, and reactive to light.  Neck: Normal range of motion. Neck supple. Carotid bruit is not present.  Cardiovascular: Normal rate, regular rhythm  and normal heart sounds.   No murmur heard. Pulmonary/Chest: Effort normal and breath sounds normal. Right breast exhibits no mass and no tenderness. Left breast exhibits no mass and no tenderness. Breasts are symmetrical.  Abdominal: Soft. Bowel sounds are normal. There is no hepatosplenomegaly.  Musculoskeletal: Normal range of motion.  Neurological: She is alert and oriented to person, place, and time.  Skin: No rash noted.  Psychiatric: She has a normal mood and affect. Her behavior is normal. Judgment and thought content normal.        Assessment & Plan:   Problem List Items Addressed This Visit      Cardiovascular and Mediastinum   CAD (coronary artery disease)    The current medical regimen is effective;  continue present plan and medications. Followed by cardiology      Relevant Medications   simvastatin (ZOCOR) 40 MG tablet   losartan (COZAAR) 100 MG tablet   hydrochlorothiazide (MICROZIDE) 12.5 MG capsule   clopidogrel (PLAVIX) 75 MG tablet   carvedilol (COREG) 12.5 MG tablet   amLODipine (NORVASC) 5 MG tablet   Other Relevant Orders   Comprehensive metabolic panel   CBC with Differential/Platelet   Urinalysis, Routine w reflex microscopic (not at [facility])   TSH   HTN (hypertension)    The current medical regimen is effective;  continue present plan and medications.       Relevant Medications   simvastatin (ZOCOR) 40 MG tablet  losartan (COZAAR) 100 MG tablet   hydrochlorothiazide (MICROZIDE) 12.5 MG capsule   carvedilol (COREG) 12.5 MG tablet   amLODipine (NORVASC) 5 MG tablet   Other Relevant Orders   Comprehensive metabolic panel   CBC with Differential/Platelet   Urinalysis, Routine w reflex microscopic (not at [facility])   TSH     Endocrine   DM (diabetes mellitus), type 2 with complications - Primary    Followed at endocrine       Relevant Medications   simvastatin (ZOCOR) 40 MG tablet   losartan (COZAAR) 100 MG tablet   Other Relevant Orders    Comprehensive metabolic panel   CBC with Differential/Platelet   Urinalysis, Routine w reflex microscopic (not at [facility])   TSH     Other   Hyperlipidemia    The current medical regimen is effective;  continue present plan and medications.       Relevant Medications   simvastatin (ZOCOR) 40 MG tablet   losartan (COZAAR) 100 MG tablet   hydrochlorothiazide (MICROZIDE) 12.5 MG capsule   carvedilol (COREG) 12.5 MG tablet   amLODipine (NORVASC) 5 MG tablet   Other Relevant Orders   Comprehensive metabolic panel   CBC with Differential/Platelet   Urinalysis, Routine w reflex microscopic (not at [facility])   TSH   Lipid panel   Chronic cough    Better with inhaler      Relevant Medications   pantoprazole (PROTONIX) 40 MG tablet   Other Relevant Orders   Comprehensive metabolic panel   CBC with Differential/Platelet   Urinalysis, Routine w reflex microscopic (not at [facility])   TSH   Anxiety disorder    lorazepam for anxiety takes every day      Relevant Medications   PARoxetine (PAXIL) 20 MG tablet   LORazepam (ATIVAN) 1 MG tablet    Other Visit Diagnoses    PE (physical exam), annual        Relevant Orders    Comprehensive metabolic panel    CBC with Differential/Platelet    Urinalysis, Routine w reflex microscopic (not at [facility])    TSH        Follow up plan: Return in about 6 months (around 11/25/2015) for BP lipids alt, ast, bmp.

## 2015-05-26 NOTE — Assessment & Plan Note (Signed)
The current medical regimen is effective;  continue present plan and medications. Followed by cardiology. 

## 2015-05-27 ENCOUNTER — Telehealth: Payer: Self-pay | Admitting: Family Medicine

## 2015-05-27 DIAGNOSIS — D649 Anemia, unspecified: Secondary | ICD-10-CM

## 2015-05-27 LAB — COMPREHENSIVE METABOLIC PANEL
ALT: 13 IU/L (ref 0–32)
AST: 17 IU/L (ref 0–40)
Albumin/Globulin Ratio: 2.2 (ref 1.1–2.5)
Albumin: 4.3 g/dL (ref 3.5–4.8)
Alkaline Phosphatase: 101 IU/L (ref 39–117)
BUN / CREAT RATIO: 16 (ref 11–26)
BUN: 20 mg/dL (ref 8–27)
Bilirubin Total: 0.3 mg/dL (ref 0.0–1.2)
CALCIUM: 9.5 mg/dL (ref 8.7–10.3)
CO2: 25 mmol/L (ref 18–29)
Chloride: 103 mmol/L (ref 97–108)
Creatinine, Ser: 1.25 mg/dL — ABNORMAL HIGH (ref 0.57–1.00)
GFR calc non Af Amer: 44 mL/min/{1.73_m2} — ABNORMAL LOW (ref >59)
GFR, EST AFRICAN AMERICAN: 50 mL/min/{1.73_m2} — AB (ref >59)
GLOBULIN, TOTAL: 2 g/dL (ref 1.5–4.5)
Glucose: 137 mg/dL — ABNORMAL HIGH (ref 65–99)
POTASSIUM: 4.3 mmol/L (ref 3.5–5.2)
Sodium: 144 mmol/L (ref 134–144)
TOTAL PROTEIN: 6.3 g/dL (ref 6.0–8.5)

## 2015-05-27 LAB — CBC WITH DIFFERENTIAL/PLATELET
Basophils Absolute: 0 10*3/uL (ref 0.0–0.2)
Basos: 1 %
EOS (ABSOLUTE): 0.4 10*3/uL (ref 0.0–0.4)
EOS: 7 %
HEMATOCRIT: 33.3 % — AB (ref 34.0–46.6)
Hemoglobin: 10.9 g/dL — ABNORMAL LOW (ref 11.1–15.9)
IMMATURE GRANS (ABS): 0 10*3/uL (ref 0.0–0.1)
IMMATURE GRANULOCYTES: 0 %
LYMPHS ABS: 2.3 10*3/uL (ref 0.7–3.1)
Lymphs: 43 %
MCH: 28.4 pg (ref 26.6–33.0)
MCHC: 32.7 g/dL (ref 31.5–35.7)
MCV: 87 fL (ref 79–97)
Monocytes Absolute: 0.4 10*3/uL (ref 0.1–0.9)
Monocytes: 8 %
NEUTROS PCT: 41 %
Neutrophils Absolute: 2.2 10*3/uL (ref 1.4–7.0)
Platelets: 278 10*3/uL (ref 150–379)
RBC: 3.84 x10E6/uL (ref 3.77–5.28)
RDW: 14.8 % (ref 12.3–15.4)
WBC: 5.3 10*3/uL (ref 3.4–10.8)

## 2015-05-27 LAB — TSH: TSH: 3.06 u[IU]/mL (ref 0.450–4.500)

## 2015-05-27 LAB — LIPID PANEL
Chol/HDL Ratio: 3 ratio units (ref 0.0–4.4)
Cholesterol, Total: 145 mg/dL (ref 100–199)
HDL: 49 mg/dL (ref >39)
LDL CALC: 63 mg/dL (ref 0–99)
TRIGLYCERIDES: 165 mg/dL — AB (ref 0–149)
VLDL Cholesterol Cal: 33 mg/dL (ref 5–40)

## 2015-05-27 NOTE — Progress Notes (Signed)
Left out of note depression screen score 0

## 2015-05-27 NOTE — Telephone Encounter (Signed)
Phone call  Discuss with pt mild drop hgb will obs recheck lab only visit 2-3 mo CBC

## 2015-06-10 ENCOUNTER — Ambulatory Visit
Admission: RE | Admit: 2015-06-10 | Discharge: 2015-06-10 | Disposition: A | Discharge status: Home / Self Care | Payer: Medicare Other | Source: Ambulatory Visit | Attending: Family Medicine | Admitting: Family Medicine

## 2015-06-10 DIAGNOSIS — Z1231 Encounter for screening mammogram for malignant neoplasm of breast: Secondary | ICD-10-CM | POA: Diagnosis not present

## 2015-06-14 ENCOUNTER — Other Ambulatory Visit: Payer: Self-pay | Admitting: Family Medicine

## 2015-06-16 ENCOUNTER — Encounter: Payer: Self-pay | Admitting: Cardiovascular Disease

## 2015-06-23 DIAGNOSIS — H40063 Primary angle closure without glaucoma damage, bilateral: Secondary | ICD-10-CM | POA: Diagnosis not present

## 2015-07-04 ENCOUNTER — Other Ambulatory Visit: Payer: Self-pay | Admitting: Family Medicine

## 2015-08-13 ENCOUNTER — Telehealth: Payer: Self-pay | Admitting: Family Medicine

## 2015-08-13 NOTE — Telephone Encounter (Signed)
Pt needs Lorazepam 1mg , she is leaving on a trip Tuesday so will need to get this on Monday.  Please let me know this is ready so I can call her and let you know.

## 2015-08-13 NOTE — Telephone Encounter (Deleted)
Pt came in and would like to get refill on    Early because she will be going out of town.

## 2015-08-13 NOTE — Telephone Encounter (Signed)
A prescription was written for this on 05/26/15 for 90 with 1 refill. Called and left a voicemail for patient to call and speak with the pharmacy because she should have a refill, also she should not be out of the original prescription because it was written for 1 daily as needed.

## 2015-08-18 DIAGNOSIS — E1165 Type 2 diabetes mellitus with hyperglycemia: Secondary | ICD-10-CM | POA: Diagnosis not present

## 2015-08-24 DIAGNOSIS — E785 Hyperlipidemia, unspecified: Secondary | ICD-10-CM | POA: Diagnosis not present

## 2015-08-24 DIAGNOSIS — I10 Essential (primary) hypertension: Secondary | ICD-10-CM | POA: Diagnosis not present

## 2015-08-24 DIAGNOSIS — E1165 Type 2 diabetes mellitus with hyperglycemia: Secondary | ICD-10-CM | POA: Diagnosis not present

## 2015-08-24 DIAGNOSIS — Z794 Long term (current) use of insulin: Secondary | ICD-10-CM | POA: Diagnosis not present

## 2015-08-24 DIAGNOSIS — E538 Deficiency of other specified B group vitamins: Secondary | ICD-10-CM | POA: Diagnosis not present

## 2015-08-24 DIAGNOSIS — Z79899 Other long term (current) drug therapy: Secondary | ICD-10-CM | POA: Diagnosis not present

## 2015-08-26 ENCOUNTER — Other Ambulatory Visit: Payer: Medicare Other

## 2015-08-26 DIAGNOSIS — D649 Anemia, unspecified: Secondary | ICD-10-CM

## 2015-08-27 LAB — CBC WITH DIFFERENTIAL/PLATELET
BASOS ABS: 0.1 10*3/uL (ref 0.0–0.2)
Basos: 1 %
EOS (ABSOLUTE): 0.4 10*3/uL (ref 0.0–0.4)
EOS: 8 %
HEMOGLOBIN: 10.6 g/dL — AB (ref 11.1–15.9)
Hematocrit: 32.3 % — ABNORMAL LOW (ref 34.0–46.6)
Immature Grans (Abs): 0 10*3/uL (ref 0.0–0.1)
Immature Granulocytes: 0 %
Lymphocytes Absolute: 1.8 10*3/uL (ref 0.7–3.1)
Lymphs: 41 %
MCH: 28.8 pg (ref 26.6–33.0)
MCHC: 32.8 g/dL (ref 31.5–35.7)
MCV: 88 fL (ref 79–97)
MONOS ABS: 0.4 10*3/uL (ref 0.1–0.9)
Monocytes: 8 %
Neutrophils Absolute: 1.9 10*3/uL (ref 1.4–7.0)
Neutrophils: 42 %
Platelets: 284 10*3/uL (ref 150–379)
RBC: 3.68 x10E6/uL — AB (ref 3.77–5.28)
RDW: 14.6 % (ref 12.3–15.4)
WBC: 4.5 10*3/uL (ref 3.4–10.8)

## 2015-08-30 ENCOUNTER — Telehealth: Payer: Self-pay | Admitting: Family Medicine

## 2015-08-30 DIAGNOSIS — D649 Anemia, unspecified: Secondary | ICD-10-CM

## 2015-08-30 NOTE — Telephone Encounter (Signed)
-----   Message from Wynn Maudlin, [place] sent at 08/30/2015 12:15 PM EDT ----- Line [phone]

## 2015-08-30 NOTE — Telephone Encounter (Signed)
Phone call Discussed with patient worsening anemia nonspecific may be renal related but will check vitamin and iron reticulocyte count

## 2015-08-31 ENCOUNTER — Other Ambulatory Visit: Payer: Self-pay | Admitting: Family Medicine

## 2015-08-31 ENCOUNTER — Other Ambulatory Visit: Payer: Medicare Other

## 2015-08-31 ENCOUNTER — Other Ambulatory Visit: Payer: Self-pay

## 2015-08-31 DIAGNOSIS — D649 Anemia, unspecified: Secondary | ICD-10-CM | POA: Diagnosis not present

## 2015-09-01 ENCOUNTER — Telehealth: Payer: Self-pay | Admitting: Family Medicine

## 2015-09-01 LAB — CBC WITH DIFFERENTIAL/PLATELET
BASOS ABS: 0 10*3/uL (ref 0.0–0.2)
Basos: 1 %
EOS (ABSOLUTE): 0.3 10*3/uL (ref 0.0–0.4)
Eos: 8 %
HEMATOCRIT: 32.9 % — AB (ref 34.0–46.6)
HEMOGLOBIN: 10.6 g/dL — AB (ref 11.1–15.9)
Immature Grans (Abs): 0 10*3/uL (ref 0.0–0.1)
Immature Granulocytes: 0 %
LYMPHS ABS: 1.9 10*3/uL (ref 0.7–3.1)
Lymphs: 45 %
MCH: 28.6 pg (ref 26.6–33.0)
MCHC: 32.2 g/dL (ref 31.5–35.7)
MCV: 89 fL (ref 79–97)
MONOCYTES: 9 %
Monocytes Absolute: 0.4 10*3/uL (ref 0.1–0.9)
NEUTROS ABS: 1.5 10*3/uL (ref 1.4–7.0)
Neutrophils: 37 %
Platelets: 273 10*3/uL (ref 150–379)
RBC: 3.71 x10E6/uL — ABNORMAL LOW (ref 3.77–5.28)
RDW: 14.5 % (ref 12.3–15.4)
WBC: 4.1 10*3/uL (ref 3.4–10.8)

## 2015-09-01 LAB — IRON AND TIBC
Iron Saturation: 13 % — ABNORMAL LOW (ref 15–55)
Iron: 45 ug/dL (ref 27–139)
TIBC: 342 ug/dL (ref 250–450)
UIBC: 297 ug/dL (ref 118–369)

## 2015-09-01 LAB — FERRITIN: FERRITIN: 13 ng/mL — AB (ref 15–150)

## 2015-09-01 NOTE — Telephone Encounter (Signed)
Phone call Discussed with patient iron deficiency anemia Patient had GI workup this summer with negative colonoscopy and endoscopy. Discussed taking iron with patient Follow-up CBC 1 month or so.

## 2015-09-01 NOTE — Telephone Encounter (Signed)
-----   Message from Wynn Maudlin, [place] sent at 09/01/2015  5:10 PM EDT ----- Line [phone]

## 2015-09-27 ENCOUNTER — Ambulatory Visit: Payer: Medicare Other

## 2015-09-27 DIAGNOSIS — Z23 Encounter for immunization: Secondary | ICD-10-CM

## 2015-12-01 ENCOUNTER — Encounter: Payer: Self-pay | Admitting: Family Medicine

## 2015-12-01 ENCOUNTER — Ambulatory Visit (INDEPENDENT_AMBULATORY_CARE_PROVIDER_SITE_OTHER): Payer: Medicare Other | Admitting: Family Medicine

## 2015-12-01 VITALS — BP 124/72 | HR 50 | Temp 97.9°F | Ht 63.1 in | Wt 178.0 lb

## 2015-12-01 DIAGNOSIS — I2510 Atherosclerotic heart disease of native coronary artery without angina pectoris: Secondary | ICD-10-CM

## 2015-12-01 DIAGNOSIS — E785 Hyperlipidemia, unspecified: Secondary | ICD-10-CM

## 2015-12-01 DIAGNOSIS — I10 Essential (primary) hypertension: Secondary | ICD-10-CM | POA: Diagnosis not present

## 2015-12-01 DIAGNOSIS — R05 Cough: Secondary | ICD-10-CM

## 2015-12-01 DIAGNOSIS — D509 Iron deficiency anemia, unspecified: Secondary | ICD-10-CM | POA: Insufficient documentation

## 2015-12-01 DIAGNOSIS — I2583 Coronary atherosclerosis due to lipid rich plaque: Secondary | ICD-10-CM

## 2015-12-01 DIAGNOSIS — R053 Chronic cough: Secondary | ICD-10-CM

## 2015-12-01 LAB — LP+ALT+AST PICCOLO, WAIVED
ALT (SGPT) Piccolo, Waived: 22 U/L (ref 10–47)
AST (SGOT) Piccolo, Waived: 23 U/L (ref 11–38)
CHOL/HDL RATIO PICCOLO,WAIVE: 3.3 mg/dL
CHOLESTEROL PICCOLO, WAIVED: 147 mg/dL (ref <200)
HDL Chol Piccolo, Waived: 45 mg/dL — ABNORMAL LOW (ref >59)
LDL CHOL CALC PICCOLO WAIVED: 66 mg/dL (ref <100)
Triglycerides Piccolo,Waived: 179 mg/dL — ABNORMAL HIGH (ref <150)
VLDL CHOL CALC PICCOLO,WAIVE: 36 mg/dL — AB (ref <30)

## 2015-12-01 MED ORDER — LOSARTAN POTASSIUM 100 MG PO TABS: 100.0000 mg | ORAL_TABLET | Freq: Every day | ORAL | Status: DC

## 2015-12-01 NOTE — Assessment & Plan Note (Signed)
Stable but coughing again at night will give patient some Ladona Ridgel is that helped in the past

## 2015-12-01 NOTE — Assessment & Plan Note (Signed)
Doing well with no complaints stable on medication

## 2015-12-01 NOTE — Assessment & Plan Note (Signed)
The current medical regimen is effective;  continue present plan and medications.  

## 2015-12-01 NOTE — Assessment & Plan Note (Signed)
Patient never took iron because of upset stomach Will recheck CBC today and see where patient is.

## 2015-12-01 NOTE — Progress Notes (Signed)
BP 124/72 mmHg  Pulse 50  Temp(Src) 97.9 F (36.6 C)  Ht 5' 3.1" (1.603 m)  Wt 178 lb (80.74 kg)  BMI 31.42 kg/m2  SpO2 97%   Subjective:    Patient ID: Laura Rojas, female    DOB: [birth year], 70 y.o.   MRN: [id number]  HPI: Laura Rojas is a 70 y.o. female  Chief Complaint  Patient presents with  . Hypertension  . Hyperlipidemia   patient follow-up diabetes care is done at endocrinology at [facility] which is doing okay. Patient's blood pressure doing well with medications no complaints no side effects taken faithfully Cholesterol medicines doing well with no side effects taken faithfully reflux stable proximal Paxil doing okay  Patient complaining of right second toe sausagelike with tender joints reviewed Dr. Alvera Singh notes and labs which were normal with diagnosis of tendinitis and arthritis Relevant past medical, surgical, family and social history reviewed and updated as indicated. Interim medical history since our last visit reviewed. Allergies and medications reviewed and updated.  Review of Systems  Constitutional: Negative.   Respiratory: Negative.   Cardiovascular: Negative.     Per HPI unless specifically indicated above     Objective:    BP 124/72 mmHg  Pulse 50  Temp(Src) 97.9 F (36.6 C)  Ht 5' 3.1" (1.603 m)  Wt 178 lb (80.74 kg)  BMI 31.42 kg/m2  SpO2 97%  Wt Readings from Last 3 Encounters:  12/01/15 178 lb (80.74 kg)  05/26/15 174 lb (78.926 kg)  02/04/15 169 lb (76.658 kg)    Physical Exam  Constitutional: She is oriented to person, place, and time. She appears well-developed and well-nourished. No distress.  HENT:  Head: Normocephalic and atraumatic.  Right Ear: Hearing normal.  Left Ear: Hearing normal.  Nose: Nose normal.  Eyes: Conjunctivae and lids are normal. Right eye exhibits no discharge. Left eye exhibits no discharge. No scleral icterus.  Cardiovascular: Normal rate, regular rhythm and normal heart sounds.    Pulmonary/Chest: Effort normal. No respiratory distress.  Musculoskeletal: Normal range of motion.  Right second toe joints tender to palpation slightly swollen Patient also having some muscle cramping reviewed stretching massage heat and dietary calcium and potassium for relief.  Neurological: She is alert and oriented to person, place, and time.  Skin: Skin is intact. No rash noted.  Psychiatric: She has a normal mood and affect. Her speech is normal and behavior is normal. Judgment and thought content normal. Cognition and memory are normal.    Results for orders placed or performed in visit on 08/31/15  CBC with Differential/Platelet  Result Value Ref Range   WBC 4.1 3.4 - 10.8 x10E3/uL   RBC 3.71 (L) 3.77 - 5.28 x10E6/uL   Hemoglobin 10.6 (L) 11.1 - 15.9 g/dL   Hematocrit 32.9 (L) 34.0 - 46.6 %   MCV 89 79 - 97 fL   MCH 28.6 26.6 - 33.0 pg   MCHC 32.2 31.5 - 35.7 g/dL   RDW 14.5 12.3 - 15.4 %   Platelets 273 150 - 379 x10E3/uL   Neutrophils 37 %   Lymphs 45 %   Monocytes 9 %   Eos 8 %   Basos 1 %   Neutrophils Absolute 1.5 1.4 - 7.0 x10E3/uL   Lymphocytes Absolute 1.9 0.7 - 3.1 x10E3/uL   Monocytes Absolute 0.4 0.1 - 0.9 x10E3/uL   EOS (ABSOLUTE) 0.3 0.0 - 0.4 x10E3/uL   Basophils Absolute 0.0 0.0 - 0.2 x10E3/uL   Immature Granulocytes 0 %  Immature Grans (Abs) 0.0 0.0 - 0.1 x10E3/uL  Iron and TIBC  Result Value Ref Range   Total Iron Binding Capacity 342 250 - 450 ug/dL   UIBC 297 118 - 369 ug/dL   Iron 45 27 - 139 ug/dL   Iron Saturation 13 (L) 15 - 55 %  Ferritin  Result Value Ref Range   Ferritin 13 (L) 15 - 150 ng/mL      Assessment & Plan:   Problem List Items Addressed This Visit      Cardiovascular and Mediastinum   HTN (hypertension)    The current medical regimen is effective;  continue present plan and medications.       Relevant Medications   losartan (COZAAR) 100 MG tablet   CAD (coronary artery disease)    Doing well with no complaints  stable on medication      Relevant Medications   losartan (COZAAR) 100 MG tablet     Other   Hyperlipidemia    The current medical regimen is effective;  continue present plan and medications.       Relevant Medications   losartan (COZAAR) 100 MG tablet   Fe deficiency anemia    Patient never took iron because of upset stomach Will recheck CBC today and see where patient is.      Relevant Orders   CBC with Differential/Platelet   Chronic cough    Stable but coughing again at night will give patient some Tessalon Perles is that helped in the past       Other Visit Diagnoses    Hyperlipemia    -  Primary    Relevant Medications    losartan (COZAAR) 100 MG tablet    Other Relevant Orders    LP+ALT+AST Piccolo, Waived    Basic metabolic panel    Essential hypertension, benign        Relevant Medications    losartan (COZAAR) 100 MG tablet    Other Relevant Orders    LP+ALT+AST Piccolo, Waived    Basic metabolic panel        Follow up plan: Return in about 6 months (around 05/30/2016) for Physical Exam.

## 2015-12-02 ENCOUNTER — Encounter: Payer: Self-pay | Admitting: Family Medicine

## 2015-12-02 LAB — CBC WITH DIFFERENTIAL/PLATELET
BASOS: 1 %
Basophils Absolute: 0 10*3/uL (ref 0.0–0.2)
EOS (ABSOLUTE): 0.3 10*3/uL (ref 0.0–0.4)
EOS: 6 %
HEMATOCRIT: 34.2 % (ref 34.0–46.6)
HEMOGLOBIN: 11.3 g/dL (ref 11.1–15.9)
Immature Grans (Abs): 0 10*3/uL (ref 0.0–0.1)
Immature Granulocytes: 0 %
LYMPHS ABS: 1.7 10*3/uL (ref 0.7–3.1)
Lymphs: 32 %
MCH: 28.6 pg (ref 26.6–33.0)
MCHC: 33 g/dL (ref 31.5–35.7)
MCV: 87 fL (ref 79–97)
MONOCYTES: 7 %
MONOS ABS: 0.4 10*3/uL (ref 0.1–0.9)
NEUTROS ABS: 2.9 10*3/uL (ref 1.4–7.0)
Neutrophils: 54 %
Platelets: 308 10*3/uL (ref 150–379)
RBC: 3.95 x10E6/uL (ref 3.77–5.28)
RDW: 14.2 % (ref 12.3–15.4)
WBC: 5.3 10*3/uL (ref 3.4–10.8)

## 2015-12-02 LAB — BASIC METABOLIC PANEL
BUN / CREAT RATIO: 16 (ref 11–26)
BUN: 19 mg/dL (ref 8–27)
CO2: 24 mmol/L (ref 18–29)
CREATININE: 1.21 mg/dL — AB (ref 0.57–1.00)
Calcium: 9.6 mg/dL (ref 8.7–10.3)
Chloride: 99 mmol/L (ref 96–106)
GFR calc non Af Amer: 45 mL/min/{1.73_m2} — ABNORMAL LOW (ref >59)
GFR, EST AFRICAN AMERICAN: 52 mL/min/{1.73_m2} — AB (ref >59)
GLUCOSE: 222 mg/dL — AB (ref 65–99)
Potassium: 4.5 mmol/L (ref 3.5–5.2)
SODIUM: 138 mmol/L (ref 134–144)

## 2015-12-03 ENCOUNTER — Telehealth: Payer: Self-pay | Admitting: Family Medicine

## 2015-12-03 ENCOUNTER — Other Ambulatory Visit: Payer: Self-pay | Admitting: Family Medicine

## 2015-12-03 MED ORDER — BENZONATATE 200 MG PO CAPS: 200.0000 mg | ORAL_CAPSULE | Freq: Three times a day (TID) | ORAL | Status: DC | PRN

## 2015-12-03 NOTE — Telephone Encounter (Signed)
Letter went out in mail today about labs, all stable. Left message to check with pharmacy because all Rx's written back in June were for year supply

## 2015-12-03 NOTE — Telephone Encounter (Signed)
Patient stated that her medications have not been called in to her pharmacy [facility] in [place], also patient needs lab results too, please call patient [phone]. [phone] call patient on cell.

## 2015-12-22 DIAGNOSIS — H40003 Preglaucoma, unspecified, bilateral: Secondary | ICD-10-CM | POA: Diagnosis not present

## 2015-12-23 DIAGNOSIS — E538 Deficiency of other specified B group vitamins: Secondary | ICD-10-CM | POA: Diagnosis not present

## 2015-12-23 DIAGNOSIS — E1165 Type 2 diabetes mellitus with hyperglycemia: Secondary | ICD-10-CM | POA: Diagnosis not present

## 2015-12-30 DIAGNOSIS — E1121 Type 2 diabetes mellitus with diabetic nephropathy: Secondary | ICD-10-CM | POA: Diagnosis not present

## 2015-12-30 DIAGNOSIS — Z794 Long term (current) use of insulin: Secondary | ICD-10-CM | POA: Diagnosis not present

## 2015-12-30 DIAGNOSIS — I10 Essential (primary) hypertension: Secondary | ICD-10-CM | POA: Diagnosis not present

## 2015-12-30 DIAGNOSIS — E1165 Type 2 diabetes mellitus with hyperglycemia: Secondary | ICD-10-CM | POA: Diagnosis not present

## 2015-12-30 DIAGNOSIS — E785 Hyperlipidemia, unspecified: Secondary | ICD-10-CM | POA: Diagnosis not present

## 2015-12-30 DIAGNOSIS — E538 Deficiency of other specified B group vitamins: Secondary | ICD-10-CM | POA: Diagnosis not present

## 2016-01-14 DIAGNOSIS — E1165 Type 2 diabetes mellitus with hyperglycemia: Secondary | ICD-10-CM | POA: Diagnosis not present

## 2016-01-24 DIAGNOSIS — E1165 Type 2 diabetes mellitus with hyperglycemia: Secondary | ICD-10-CM | POA: Diagnosis not present

## 2016-01-24 DIAGNOSIS — E785 Hyperlipidemia, unspecified: Secondary | ICD-10-CM | POA: Diagnosis not present

## 2016-01-24 DIAGNOSIS — E538 Deficiency of other specified B group vitamins: Secondary | ICD-10-CM | POA: Diagnosis not present

## 2016-01-24 DIAGNOSIS — Z794 Long term (current) use of insulin: Secondary | ICD-10-CM | POA: Diagnosis not present

## 2016-01-24 DIAGNOSIS — I10 Essential (primary) hypertension: Secondary | ICD-10-CM | POA: Diagnosis not present

## 2016-02-08 ENCOUNTER — Other Ambulatory Visit: Payer: Self-pay | Admitting: Family Medicine

## 2016-02-15 DIAGNOSIS — Z794 Long term (current) use of insulin: Secondary | ICD-10-CM | POA: Diagnosis not present

## 2016-02-15 DIAGNOSIS — E1165 Type 2 diabetes mellitus with hyperglycemia: Secondary | ICD-10-CM | POA: Diagnosis not present

## 2016-02-15 DIAGNOSIS — E11649 Type 2 diabetes mellitus with hypoglycemia without coma: Secondary | ICD-10-CM | POA: Diagnosis not present

## 2016-02-24 DIAGNOSIS — E1165 Type 2 diabetes mellitus with hyperglycemia: Secondary | ICD-10-CM | POA: Diagnosis not present

## 2016-02-24 DIAGNOSIS — Z794 Long term (current) use of insulin: Secondary | ICD-10-CM | POA: Diagnosis not present

## 2016-03-29 ENCOUNTER — Ambulatory Visit (INDEPENDENT_AMBULATORY_CARE_PROVIDER_SITE_OTHER): Payer: Medicare Other | Admitting: Cardiovascular Disease

## 2016-03-29 ENCOUNTER — Encounter: Payer: Self-pay | Admitting: Cardiovascular Disease

## 2016-03-29 VITALS — BP 105/65 | HR 60 | Ht 66.0 in | Wt 182.4 lb

## 2016-03-29 DIAGNOSIS — Z794 Long term (current) use of insulin: Secondary | ICD-10-CM

## 2016-03-29 DIAGNOSIS — E785 Hyperlipidemia, unspecified: Secondary | ICD-10-CM | POA: Diagnosis not present

## 2016-03-29 DIAGNOSIS — E118 Type 2 diabetes mellitus with unspecified complications: Secondary | ICD-10-CM | POA: Diagnosis not present

## 2016-03-29 DIAGNOSIS — I25118 Atherosclerotic heart disease of native coronary artery with other forms of angina pectoris: Secondary | ICD-10-CM

## 2016-03-29 DIAGNOSIS — I10 Essential (primary) hypertension: Secondary | ICD-10-CM

## 2016-03-29 DIAGNOSIS — I2510 Atherosclerotic heart disease of native coronary artery without angina pectoris: Secondary | ICD-10-CM

## 2016-03-29 DIAGNOSIS — I708 Atherosclerosis of other arteries: Secondary | ICD-10-CM

## 2016-03-29 DIAGNOSIS — I771 Stricture of artery: Secondary | ICD-10-CM

## 2016-03-29 MED ORDER — CARVEDILOL 6.25 MG PO TABS: 6.2500 mg | ORAL_TABLET | Freq: Two times a day (BID) | ORAL | Status: DC

## 2016-03-29 NOTE — Progress Notes (Signed)
Patient ID: Laura Rojas, female   DOB: 07-20-1946, 70 y.o.   MRN: [id number]    Cardiology Office Note    Date:  03/29/2016   ID:  Laura Rojas 09/24/1946, MRN [id number]  PCP:  Golden Pop, MD  Cardiologist:   Sanda Klein, MD   Chief complaint: Weakness and fatigue   History of Present Illness:  Laura Rojas is a 70 y.o. female with coronary disease, PAD, DM, HTN and hyperlipidemia.  She denies angina pectoris at rest or with activity. She complains of lack of energy, fatigue, weakness and occasional dizziness. She has not had syncope and denies palpitations. When she checks her blood pressure on it is occasionally less than 100 mmHg. Her heart rate oscillates in the high 50s. She denies leg edema, focal neurological deficits. Glycemic control is improving with her most recent hemoglobin A1c just over 7% (had been greater than 9%). She has had some episodes of hypoglycemia sometimes catch her a little unexpectedly. Her lowest blood glucose has been around 40.  She had stents placed in the proximal LAD and proximal RCA (2007, 3.0 x 28 mm drug-eluting Cypher stent to RCA, 3.5 x 33 mm drug-eluting Cypher stent and proximal LAD). She is known to have an untreated stenosis in the very distal portion of the LAD and a 60% "jailed" ostial diagonal stenosis at the level of the proximal LAD stent. She has preserved LV systolic function and has never had congestive heart failure. Her most recent functional study was a nuclear stress test in 2013 that showed normal perfusion and normal LVEF. Her most recent cardiac catheterization was performed in 2009 and both the LAD and RCA stents were found to be widely patent. Significant comorbidities include diabetes mellitus type 2, hyperlipidemia and hypertension.    Past Medical History  Diagnosis Date  . CAD (coronary artery disease)   . Dyslipidemia   . CAD (coronary artery disease) 12/31/2013    2007, 3.0 x 28 mm drug-eluting Cypher stent to  RCA, 3.5 x 33 mm drug-eluting Cypher stent and proximal LAD) Consent by 2009 Normal perfusion by mildly 2013   . Subclavian artery stenosis, left 01/01/2015    Past Surgical History  Procedure Laterality Date  . Replacement total knee  2003 & 2004  . Vaginal hysterectomy  1983  . Coronary angioplasty with stent placement  09/17/2006    PTCA & stenting LAD  . Cardiac catheterization  08/25/2008    patent stents  . Esophagogastroduodenoscopy N/A 04/27/2015    Procedure: ESOPHAGOGASTRODUODENOSCOPY (EGD);  Surgeon: Lucilla Lame, MD;  Location: [facility] END[facility]COPY;  Service: Endoscopy;  Laterality: N/A;  . Transthoracic echocardiogram  09/2006    EF =>55%. IMPAIRED LV RELAXATION. MILD MITRAL ANNULAR CALCIFICATION. AV-MILDLY SCLEROTIC. NO AS.  Marland Kitchen Nm myocar multiple w/spect  11/2011    EF 72%.NORMAL MYOCARDIAL PERFUSION STUDY  . Carotid duplex  10/2006    NORMAL CAROTID ARTERY DUPLEX  . Breast biopsy Right 1970    EXCISIONAL - NEG  . Breast biopsy Left 1973    EXCISIONAL - NEG    Current Medications: Outpatient Prescriptions Prior to Visit  Medication Sig Dispense Refill  . amLODipine (NORVASC) 5 MG tablet Take 1 tablet (5 mg total) by mouth daily. 90 tablet 4  . aspirin 81 MG tablet Take 81 mg by mouth daily.    . benzonatate (TESSALON) 200 MG capsule Take 1 capsule (200 mg total) by mouth 3 (three) times daily as needed for cough. 30 capsule 0  .  carvedilol (COREG) 12.5 MG tablet Take 1 tablet (12.5 mg total) by mouth 2 (two) times daily. 180 tablet 4  . clopidogrel (PLAVIX) 75 MG tablet Take 1 tablet (75 mg total) by mouth daily. 90 tablet 4  . Fluticasone Furoate (ARNUITY ELLIPTA) 100 MCG/ACT AEPB Inhale 1 puff into the lungs daily. Rinse and gargle after each use. 30 each 0  . hydrochlorothiazide (MICROZIDE) 12.5 MG capsule Take 1 capsule (12.5 mg total) by mouth daily. 90 capsule 4  . insulin aspart (NOVOLOG) 100 UNIT/ML injection Inject 6 Units into the skin 3 (three) times daily before  meals.    . isosorbide mononitrate (IMDUR) 60 MG 24 hr tablet 1 TABLET ER 24HR ONCE EACH DAY ORAL 90 tablet 2  . latanoprost (XALATAN) 0.005 % ophthalmic solution Place 1 drop into both eyes at bedtime.     Marland Kitchen LORazepam (ATIVAN) 1 MG tablet TAKE 1 TABLET BY MOUTH EVERY DAY AS NEEDED 90 tablet 1  . losartan (COZAAR) 100 MG tablet Take 1 tablet (100 mg total) by mouth daily. 90 tablet 1  . magnesium 30 MG tablet Take 30 mg by mouth daily.    . metFORMIN (GLUCOPHAGE) 1000 MG tablet Take 1,000 mg by mouth 2 (two) times daily.    . mometasone (NASONEX) 50 MCG/ACT nasal spray Place 2 sprays into the nose daily. 17 g 0  . Omega-3 Fatty Acids (FISH OIL) 1200 MG CAPS Take 1,200 mg by mouth daily.    . pantoprazole (PROTONIX) 40 MG tablet Take 1 tablet (40 mg total) by mouth daily. 90 tablet 4  . PARoxetine (PAXIL) 20 MG tablet Take 1 tablet (20 mg total) by mouth daily. 90 tablet 4  . simvastatin (ZOCOR) 40 MG tablet Take 1 tablet (40 mg total) by mouth daily. 90 tablet 4   No facility-administered medications prior to visit.     Allergies:   Actos; Altace; Amlodipine; Compazine; Crestor; Lipitor; Reglan; Sulfa antibiotics; and Latex   Social History   Social History  . Marital Status: Married    Spouse Name: N/A  . Number of Children: N/A  . Years of Education: N/A   Social History Main Topics  . Smoking status: Never Smoker   . Smokeless tobacco: Never Used  . Alcohol Use: No  . Drug Use: No  . Sexual Activity: Not on file   Other Topics Concern  . Not on file   Social History Narrative     Family History:  The patient's family history includes Breast cancer in her mother; Cancer in her mother and sister; Diabetes in her sister; Heart disease in her maternal grandfather and maternal grandmother.   ROS:   Please see the history of present illness.    ROS All other systems reviewed and are negative.   PHYSICAL EXAM:   VS:  There were no vitals taken for this visit.   GEN: Well  nourished, well developed, in no acute distress HEENT: normal Neck: no JVD, carotid bruits, or masses Cardiac: RRR; no murmurs, rubs, or gallops,no edema  Respiratory:  clear to auscultation bilaterally, normal work of breathing GI: soft, nontender, nondistended, + BS MS: no deformity or atrophy Skin: warm and dry, no rash Neuro:  Alert and Oriented x 3, Strength and sensation are intact Psych: euthymic mood, full affect  Wt Readings from Last 3 Encounters:  12/01/15 80.74 kg (178 lb)  05/26/15 78.926 kg (174 lb)  02/04/15 76.658 kg (169 lb)      Studies/Labs Reviewed:  EKG:  EKG is ordered today.  The ekg ordered today demonstrates Normal sinus rhythm, generalized low voltage QRS, poor R-wave progression across the anterior precordium, nonspecific minimal downsloping ST depression and T-wave inversion in leads V3-V4. The tracing is essentially identical to one year ago  Recent Labs: 05/26/2015: TSH 3.060 12/01/2015: ALT (SGPT) Piccolo, Waived 22; BUN 19; Creatinine, Ser 1.21*; Platelets 308; Potassium 4.5; Sodium 138   Lipid Panel    Component Value Date/Time   CHOL 147 12/01/2015 0903   CHOL 145 05/26/2015 1402   TRIG 165* 05/26/2015 1402   HDL 49 05/26/2015 1402   CHOLHDL 3.0 05/26/2015 1402   LDLCALC 63 05/26/2015 1402    Additional studies/ records that were reviewed today include:  Notes from Dr. Jeananne Rama    ASSESSMENT:    1. Coronary artery disease involving native coronary artery of native heart with other form of angina pectoris ([place])   2. Essential hypertension   3. Type 2 diabetes mellitus with complication, with long-term current use of insulin ([place])   4. Hyperlipidemia   5. Subclavian artery stenosis, left      PLAN:  In order of problems listed above:  1. CAD: Asymptomatic. Discontinuing long-acting nitrates. Both her blood pressure and heart rate are borderline low and she appears to be symptomatic with low energy fatigue and dizziness. We'll reduce  to carvedilol to 6.25 mg twice a day. I'm also a little concerned that this medication may be contributing to some degree of hypoglycemia unawareness. While a beta blocker is important for her long-term care, I think a lower dose might be safer. 2. HTN: Control maybe a little excessive. Reduce beta blocker dose 3. DM: Recent improvement in glycemic control has been associated with some increase in frequency of hypoglycemic events. She is followed by an endocrinologist at [facility]. Discussed signs and symptoms of hypoglycemia. 4. HLP: On statin with most lipid parameters well within target range 5. PAD: There was no evidence of significant carotid or subclavian stenosis on duplex ultrasonography performed in 2016 there was antegrade flow in the left vertebral artery. I still detect an approximately 15-20 mmHg difference in blood pressure between the right and left upper extremity and advised that her blood pressure always be checked on the right side    Medication Adjustments/Labs and Tests Ordered: Current medicines are reviewed at length with the patient today.  Concerns regarding medicines are outlined above.  Medication changes, Labs and Tests ordered today are listed in the Patient Instructions below. There are no Patient Instructions on file for this visit.   Mikael Spray, MD  03/29/2016 2:12 PM    [facility] [address], [place], [place]  [address] Phone: [phone]; Fax: [phone]

## 2016-03-29 NOTE — Patient Instructions (Signed)
Dr Sallyanne Kuster has recommended making the following medication changes: 1. STOP Isosorbide 2. DECREASE Carvedilol to 6.25 mg twice daily  >>A new prescription has been sent to your pharmacy electronically  Dr Sallyanne Kuster recommends that you schedule a follow-up appointment in 1 year. You will receive a reminder letter in the mail two months in advance. If you don't receive a letter, please call our office to schedule the follow-up appointment.  If you need a refill on your cardiac medications before your next appointment, please call your pharmacy.

## 2016-03-30 DIAGNOSIS — E1165 Type 2 diabetes mellitus with hyperglycemia: Secondary | ICD-10-CM | POA: Diagnosis not present

## 2016-03-30 DIAGNOSIS — Z794 Long term (current) use of insulin: Secondary | ICD-10-CM | POA: Diagnosis not present

## 2016-03-30 LAB — HEMOGLOBIN A1C: HEMOGLOBIN A1C: 8.8

## 2016-03-31 DIAGNOSIS — B0059 Other herpesviral disease of eye: Secondary | ICD-10-CM | POA: Diagnosis not present

## 2016-04-04 ENCOUNTER — Ambulatory Visit (INDEPENDENT_AMBULATORY_CARE_PROVIDER_SITE_OTHER): Payer: Medicare Other | Admitting: Family Medicine

## 2016-04-04 ENCOUNTER — Encounter: Payer: Self-pay | Admitting: Family Medicine

## 2016-04-04 VITALS — BP 105/68 | HR 60 | Temp 97.8°F | Ht 66.0 in | Wt 181.0 lb

## 2016-04-04 DIAGNOSIS — B029 Zoster without complications: Secondary | ICD-10-CM | POA: Diagnosis not present

## 2016-04-04 DIAGNOSIS — Z794 Long term (current) use of insulin: Secondary | ICD-10-CM | POA: Diagnosis not present

## 2016-04-04 DIAGNOSIS — I2510 Atherosclerotic heart disease of native coronary artery without angina pectoris: Secondary | ICD-10-CM

## 2016-04-04 DIAGNOSIS — E118 Type 2 diabetes mellitus with unspecified complications: Secondary | ICD-10-CM

## 2016-04-04 NOTE — Assessment & Plan Note (Signed)
Discussed diabetes and prednisone which could be used to treat shingles due to poor control of diabetes elected not to use prednisone.

## 2016-04-04 NOTE — Progress Notes (Signed)
BP 105/68 mmHg  Pulse 60  Temp(Src) 97.8 F (36.6 C)  Ht 5\' 6"  (1.676 m)  Wt 181 lb (82.101 kg)  BMI 29.23 kg/m2  SpO2 96%   Subjective:    Patient ID: Laura Rojas, female    DOB: 02-20-1946, 70 y.o.   MRN: [id number]  HPI: Laura Rojas is a 70 y.o. female  Chief Complaint  Patient presents with  . possible shingles  Patient with shingles treated by eye doctor with Valtrex 1 g twice a day and antibiotics no further rash has broken out since starting medications is concerned about pain. Diabetes has done okay followed by endocrinology and is struggled with good control.  Relevant past medical, surgical, family and social history reviewed and updated as indicated. Interim medical history since our last visit reviewed. Allergies and medications reviewed and updated.  Review of Systems  Constitutional: Negative.   Skin:       No lesions on nose    Per HPI unless specifically indicated above     Objective:    BP 105/68 mmHg  Pulse 60  Temp(Src) 97.8 F (36.6 C)  Ht 5\' 6"  (1.676 m)  Wt 181 lb (82.101 kg)  BMI 29.23 kg/m2  SpO2 96%  Wt Readings from Last 3 Encounters:  04/04/16 181 lb (82.101 kg)  03/29/16 182 lb 6.4 oz (82.736 kg)  12/01/15 178 lb (80.74 kg)    Physical Exam  Constitutional: She is oriented to person, place, and time. She appears well-developed and well-nourished. No distress.  HENT:  Head: Normocephalic and atraumatic.  Right Ear: Hearing normal.  Left Ear: Hearing normal.  Nose: Nose normal.  Eyes: Conjunctivae and lids are normal. Right eye exhibits no discharge. Left eye exhibits no discharge. No scleral icterus.  Cardiovascular: Normal rate, regular rhythm and normal heart sounds.   Pulmonary/Chest: Effort normal and breath sounds normal. No respiratory distress.  Musculoskeletal: Normal range of motion.  Neurological: She is alert and oriented to person, place, and time.  Skin: Skin is intact. No rash noted.  No lesions on nose   Psychiatric: She has a normal mood and affect. Her speech is normal and behavior is normal. Judgment and thought content normal. Cognition and memory are normal.    Results for orders placed or performed in visit on 12/01/15  LP+ALT+AST Piccolo, [employer]  Result Value Ref Range   ALT (SGPT) Piccolo, Waived 22 10 - 47 U/L   AST (SGOT) Piccolo, Waived 23 11 - 38 U/L   Cholesterol Piccolo, Waived 147 <200 mg/dL   HDL Chol Piccolo, Waived 45 (L) >59 mg/dL   Triglycerides Piccolo,Waived 179 (H) <150 mg/dL   Chol/HDL Ratio Piccolo,Waive 3.3 mg/dL   LDL Chol Calc Piccolo Waived 66 <100 mg/dL   VLDL Chol Calc Piccolo,Waive 36 (H) <30 mg/dL  Basic metabolic panel  Result Value Ref Range   Glucose 222 (H) 65 - 99 mg/dL   BUN 19 8 - 27 mg/dL   Creatinine, Ser 1.21 (H) 0.57 - 1.00 mg/dL   GFR calc non Af Amer 45 (L) >59 mL/min/1.73   GFR calc Af Amer 52 (L) >59 mL/min/1.73   BUN/Creatinine Ratio 16 11 - 26   Sodium 138 134 - 144 mmol/L   Potassium 4.5 3.5 - 5.2 mmol/L   Chloride 99 96 - 106 mmol/L   CO2 24 18 - 29 mmol/L   Calcium 9.6 8.7 - 10.3 mg/dL  CBC with Differential/Platelet  Result Value Ref Range   WBC  5.3 3.4 - 10.8 x10E3/uL   RBC 3.95 3.77 - 5.28 x10E6/uL   Hemoglobin 11.3 11.1 - 15.9 g/dL   Hematocrit 34.2 34.0 - 46.6 %   MCV 87 79 - 97 fL   MCH 28.6 26.6 - 33.0 pg   MCHC 33.0 31.5 - 35.7 g/dL   RDW 14.2 12.3 - 15.4 %   Platelets 308 150 - 379 x10E3/uL   Neutrophils 54 %   Lymphs 32 %   Monocytes 7 %   Eos 6 %   Basos 1 %   Neutrophils Absolute 2.9 1.4 - 7.0 x10E3/uL   Lymphocytes Absolute 1.7 0.7 - 3.1 x10E3/uL   Monocytes Absolute 0.4 0.1 - 0.9 x10E3/uL   EOS (ABSOLUTE) 0.3 0.0 - 0.4 x10E3/uL   Basophils Absolute 0.0 0.0 - 0.2 x10E3/uL   Immature Granulocytes 0 %   Immature Grans (Abs) 0.0 0.0 - 0.1 x10E3/uL      Assessment & Plan:   Problem List Items Addressed This Visit      Endocrine   DM (diabetes mellitus), type 2 with complications ([place]) - Primary     Discussed diabetes and prednisone which could be used to treat shingles due to poor control of diabetes elected not to use prednisone.       Other Visit Diagnoses    Shingles outbreak        Shingles of left face discussed care and treatment a she will continue current medications avoid prednisone as mentioned above and discuss cautions and preventi    Relevant Medications    valACYclovir (VALTREX) 1000 MG tablet    cephALEXin (KEFLEX) 500 MG capsule        Follow up plan: Return if symptoms worsen or fail to improve, for As scheduled.

## 2016-04-06 DIAGNOSIS — I519 Heart disease, unspecified: Secondary | ICD-10-CM | POA: Diagnosis not present

## 2016-04-06 DIAGNOSIS — E538 Deficiency of other specified B group vitamins: Secondary | ICD-10-CM | POA: Diagnosis not present

## 2016-04-06 DIAGNOSIS — E1165 Type 2 diabetes mellitus with hyperglycemia: Secondary | ICD-10-CM | POA: Diagnosis not present

## 2016-04-06 DIAGNOSIS — Z794 Long term (current) use of insulin: Secondary | ICD-10-CM | POA: Diagnosis not present

## 2016-04-06 DIAGNOSIS — E1169 Type 2 diabetes mellitus with other specified complication: Secondary | ICD-10-CM | POA: Diagnosis not present

## 2016-04-10 NOTE — Addendum Note (Signed)
Addended by: Therisa Doyne on: 04/10/2016 04:29 PM   Modules accepted: Orders

## 2016-04-13 ENCOUNTER — Encounter: Payer: Self-pay | Admitting: Family Medicine

## 2016-04-13 ENCOUNTER — Ambulatory Visit (INDEPENDENT_AMBULATORY_CARE_PROVIDER_SITE_OTHER): Payer: Medicare Other | Admitting: Family Medicine

## 2016-04-13 VITALS — BP 115/76 | HR 73 | Temp 98.0°F | Ht 63.5 in | Wt 182.0 lb

## 2016-04-13 DIAGNOSIS — M5432 Sciatica, left side: Secondary | ICD-10-CM | POA: Diagnosis not present

## 2016-04-13 DIAGNOSIS — E1122 Type 2 diabetes mellitus with diabetic chronic kidney disease: Secondary | ICD-10-CM | POA: Insufficient documentation

## 2016-04-13 DIAGNOSIS — I2510 Atherosclerotic heart disease of native coronary artery without angina pectoris: Secondary | ICD-10-CM | POA: Diagnosis not present

## 2016-04-13 DIAGNOSIS — N183 Chronic kidney disease, stage 3 unspecified: Secondary | ICD-10-CM

## 2016-04-13 DIAGNOSIS — Z794 Long term (current) use of insulin: Secondary | ICD-10-CM | POA: Diagnosis not present

## 2016-04-13 DIAGNOSIS — E118 Type 2 diabetes mellitus with unspecified complications: Secondary | ICD-10-CM | POA: Diagnosis not present

## 2016-04-13 NOTE — Assessment & Plan Note (Signed)
Will continue current medications avoiding prednisone because of diabetes

## 2016-04-13 NOTE — Progress Notes (Signed)
BP 115/76 mmHg  Pulse 73  Temp(Src) 98 F (36.7 C)  Ht 5' 3.5" (1.613 m)  Wt 182 lb (82.555 kg)  BMI 31.73 kg/m2  SpO2 99%   Subjective:    Patient ID: Laura Rojas, female    DOB: [birth year], 70 y.o.   MRN: [id number]  HPI: Laura Rojas is a 70 y.o. female  Chief Complaint  Patient presents with  . Hip Pain    Left Hip pain since last week    Relevant past medical, surgical, family and social history reviewed and updated as indicated. Interim medical history since our last visit reviewed. Allergies and medications reviewed and updated.  Review of Systems  Per HPI unless specifically indicated above     Objective:    BP 115/76 mmHg  Pulse 73  Temp(Src) 98 F (36.7 C)  Ht 5' 3.5" (1.613 m)  Wt 182 lb (82.555 kg)  BMI 31.73 kg/m2  SpO2 99%  Wt Readings from Last 3 Encounters:  04/13/16 182 lb (82.555 kg)  04/04/16 181 lb (82.101 kg)  03/29/16 182 lb 6.4 oz (82.736 kg)    Physical Exam  Constitutional: She is oriented to person, place, and time. She appears well-developed and well-nourished. No distress.  HENT:  Head: Normocephalic and atraumatic.  Right Ear: Hearing normal.  Left Ear: Hearing normal.  Nose: Nose normal.  Eyes: Conjunctivae and lids are normal. Right eye exhibits no discharge. Left eye exhibits no discharge. No scleral icterus.  Cardiovascular: Normal rate, regular rhythm and normal heart sounds.   Pulmonary/Chest: Effort normal and breath sounds normal. No respiratory distress.  Musculoskeletal: Normal range of motion.  Lt leg SLR pos  Neurological: She is alert and oriented to person, place, and time.  Skin: Skin is intact. No rash noted.  Psychiatric: She has a normal mood and affect. Her speech is normal and behavior is normal. Judgment and thought content normal. Cognition and memory are normal.    Results for orders placed or performed in visit on 12/01/15  LP+ALT+AST Piccolo, [employer]  Result Value Ref Range   ALT (SGPT)  Piccolo, Waived 22 10 - 47 U/L   AST (SGOT) Piccolo, Waived 23 11 - 38 U/L   Cholesterol Piccolo, Waived 147 <200 mg/dL   HDL Chol Piccolo, Waived 45 (L) >59 mg/dL   Triglycerides Piccolo,Waived 179 (H) <150 mg/dL   Chol/HDL Ratio Piccolo,Waive 3.3 mg/dL   LDL Chol Calc Piccolo Waived 66 <100 mg/dL   VLDL Chol Calc Piccolo,Waive 36 (H) <30 mg/dL  Basic metabolic panel  Result Value Ref Range   Glucose 222 (H) 65 - 99 mg/dL   BUN 19 8 - 27 mg/dL   Creatinine, Ser 1.21 (H) 0.57 - 1.00 mg/dL   GFR calc non Af Amer 45 (L) >59 mL/min/1.73   GFR calc Af Amer 52 (L) >59 mL/min/1.73   BUN/Creatinine Ratio 16 11 - 26   Sodium 138 134 - 144 mmol/L   Potassium 4.5 3.5 - 5.2 mmol/L   Chloride 99 96 - 106 mmol/L   CO2 24 18 - 29 mmol/L   Calcium 9.6 8.7 - 10.3 mg/dL  CBC with Differential/Platelet  Result Value Ref Range   WBC 5.3 3.4 - 10.8 x10E3/uL   RBC 3.95 3.77 - 5.28 x10E6/uL   Hemoglobin 11.3 11.1 - 15.9 g/dL   Hematocrit 34.2 34.0 - 46.6 %   MCV 87 79 - 97 fL   MCH 28.6 26.6 - 33.0 pg   MCHC 33.0 31.5 -  35.7 g/dL   RDW 14.2 12.3 - 15.4 %   Platelets 308 150 - 379 x10E3/uL   Neutrophils 54 %   Lymphs 32 %   Monocytes 7 %   Eos 6 %   Basos 1 %   Neutrophils Absolute 2.9 1.4 - 7.0 x10E3/uL   Lymphocytes Absolute 1.7 0.7 - 3.1 x10E3/uL   Monocytes Absolute 0.4 0.1 - 0.9 x10E3/uL   EOS (ABSOLUTE) 0.3 0.0 - 0.4 x10E3/uL   Basophils Absolute 0.0 0.0 - 0.2 x10E3/uL   Immature Granulocytes 0 %   Immature Grans (Abs) 0.0 0.0 - 0.1 x10E3/uL      Assessment & Plan:   Problem List Items Addressed This Visit      Endocrine   DM (diabetes mellitus), type 2 with complications ([place]) - Primary    Will continue current medications avoiding prednisone because of diabetes        Genitourinary   CKD stage 3 due to type 2 diabetes mellitus ([place])    We will avoid nonsteroidal for back pain because of kidney disease       Other Visit Diagnoses    Sciatica associated with disorder of  lumbar spine, left        Discussed care and treatment Tylenol extension exercises and physical therapy referral.        Follow up plan: Return for As scheduled.

## 2016-04-13 NOTE — Progress Notes (Signed)
BP 115/76 mmHg  Pulse 73  Temp(Src) 98 F (36.7 C)  Ht 5' 3.5" (1.613 m)  Wt 182 lb (82.555 kg)  BMI 31.73 kg/m2  SpO2 99%   Subjective:    Patient ID: Laura Rojas, female    DOB: 02/03/1946, 71 y.o.   MRN: [id number]  HPI: Laura Rojas is a 70 y.o. female  Chief Complaint  Patient presents with  . Hip Pain    Left Hip pain since last week   Patient with painters coming into her living room move boxes of smaller things and since that time left hip pain radiating down the back of leg into her ankle. Bothers her especially at night also during the day and walking is the worst. Patient with no blood in stool or urine no constipation diarrhea pain hasn't moved. Patient's tried Tylenol with  a little bit of relief. No loss of control of bowel or bladder no pelvic numbness or other cauda equina symptoms.  Shingles is clear to great and done well and no further pain. Pressures been doing well. Relevant past medical, surgical, family and social history reviewed and updated as indicated. Interim medical history since our last visit reviewed. Allergies and medications reviewed and updated.  Review of Systems  Per HPI unless specifically indicated above     Objective:    BP 115/76 mmHg  Pulse 73  Temp(Src) 98 F (36.7 C)  Ht 5' 3.5" (1.613 m)  Wt 182 lb (82.555 kg)  BMI 31.73 kg/m2  SpO2 99%  Wt Readings from Last 3 Encounters:  04/13/16 182 lb (82.555 kg)  04/04/16 181 lb (82.101 kg)  03/29/16 182 lb 6.4 oz (82.736 kg)    Physical Exam  Results for orders placed or performed in visit on 12/01/15  LP+ALT+AST Piccolo, Waived  Result Value Ref Range   ALT (SGPT) Piccolo, Waived 22 10 - 47 U/L   AST (SGOT) Piccolo, Waived 23 11 - 38 U/L   Cholesterol Piccolo, Waived 147 <200 mg/dL   HDL Chol Piccolo, Waived 45 (L) >59 mg/dL   Triglycerides Piccolo,Waived 179 (H) <150 mg/dL   Chol/HDL Ratio Piccolo,Waive 3.3 mg/dL   LDL Chol Calc Piccolo Waived 66 <100 mg/dL   VLDL Chol Calc Piccolo,Waive 36 (H) <30 mg/dL  Basic metabolic panel  Result Value Ref Range   Glucose 222 (H) 65 - 99 mg/dL   BUN 19 8 - 27 mg/dL   Creatinine, Ser 1.21 (H) 0.57 - 1.00 mg/dL   GFR calc non Af Amer 45 (L) >59 mL/min/1.73   GFR calc Af Amer 52 (L) >59 mL/min/1.73   BUN/Creatinine Ratio 16 11 - 26   Sodium 138 134 - 144 mmol/L   Potassium 4.5 3.5 - 5.2 mmol/L   Chloride 99 96 - 106 mmol/L   CO2 24 18 - 29 mmol/L   Calcium 9.6 8.7 - 10.3 mg/dL  CBC with Differential/Platelet  Result Value Ref Range   WBC 5.3 3.4 - 10.8 x10E3/uL   RBC 3.95 3.77 - 5.28 x10E6/uL   Hemoglobin 11.3 11.1 - 15.9 g/dL   Hematocrit 34.2 34.0 - 46.6 %   MCV 87 79 - 97 fL   MCH 28.6 26.6 - 33.0 pg   MCHC 33.0 31.5 - 35.7 g/dL   RDW 14.2 12.3 - 15.4 %   Platelets 308 150 - 379 x10E3/uL   Neutrophils 54 %   Lymphs 32 %   Monocytes 7 %   Eos 6 %   Basos  1 %   Neutrophils Absolute 2.9 1.4 - 7.0 x10E3/uL   Lymphocytes Absolute 1.7 0.7 - 3.1 x10E3/uL   Monocytes Absolute 0.4 0.1 - 0.9 x10E3/uL   EOS (ABSOLUTE) 0.3 0.0 - 0.4 x10E3/uL   Basophils Absolute 0.0 0.0 - 0.2 x10E3/uL   Immature Granulocytes 0 %   Immature Grans (Abs) 0.0 0.0 - 0.1 x10E3/uL      Assessment & Plan:   Problem List Items Addressed This Visit      Endocrine   DM (diabetes mellitus), type 2 with complications ([place]) - Primary    Will continue current medications avoiding prednisone because of diabetes        Genitourinary   CKD stage 3 due to type 2 diabetes mellitus ([place])    We will avoid nonsteroidal for back pain because of kidney disease       Other Visit Diagnoses    Sciatica associated with disorder of lumbar spine, left        Discussed care and treatment Tylenol extension exercises and physical therapy referral.        Follow up plan: Return for As scheduled.

## 2016-04-13 NOTE — Assessment & Plan Note (Signed)
We will avoid nonsteroidal for back pain because of kidney disease

## 2016-04-20 DIAGNOSIS — H40003 Preglaucoma, unspecified, bilateral: Secondary | ICD-10-CM | POA: Diagnosis not present

## 2016-05-09 ENCOUNTER — Other Ambulatory Visit: Payer: Self-pay | Admitting: Family Medicine

## 2016-05-09 DIAGNOSIS — Z1231 Encounter for screening mammogram for malignant neoplasm of breast: Secondary | ICD-10-CM

## 2016-05-10 DIAGNOSIS — H40003 Preglaucoma, unspecified, bilateral: Secondary | ICD-10-CM | POA: Diagnosis not present

## 2016-05-10 LAB — HM DIABETES EYE EXAM

## 2016-06-07 ENCOUNTER — Other Ambulatory Visit: Payer: Self-pay | Admitting: Family Medicine

## 2016-06-12 ENCOUNTER — Other Ambulatory Visit: Payer: Self-pay | Admitting: Family Medicine

## 2016-06-12 ENCOUNTER — Ambulatory Visit
Admission: RE | Admit: 2016-06-12 | Discharge: 2016-06-12 | Disposition: A | Discharge status: Home / Self Care | Payer: Medicare Other | Source: Ambulatory Visit | Attending: Family Medicine | Admitting: Family Medicine

## 2016-06-12 DIAGNOSIS — Z1231 Encounter for screening mammogram for malignant neoplasm of breast: Secondary | ICD-10-CM | POA: Diagnosis not present

## 2016-06-16 ENCOUNTER — Other Ambulatory Visit: Payer: Self-pay | Admitting: Family Medicine

## 2016-07-05 ENCOUNTER — Other Ambulatory Visit: Payer: Self-pay | Admitting: Family Medicine

## 2016-07-05 ENCOUNTER — Encounter: Payer: Self-pay | Admitting: Family Medicine

## 2016-07-05 ENCOUNTER — Ambulatory Visit (INDEPENDENT_AMBULATORY_CARE_PROVIDER_SITE_OTHER): Payer: Medicare Other | Admitting: Family Medicine

## 2016-07-05 VITALS — BP 114/72 | Temp 97.6°F | Ht 63.3 in | Wt 182.0 lb

## 2016-07-05 DIAGNOSIS — F413 Other mixed anxiety disorders: Secondary | ICD-10-CM | POA: Diagnosis not present

## 2016-07-05 DIAGNOSIS — I2510 Atherosclerotic heart disease of native coronary artery without angina pectoris: Secondary | ICD-10-CM

## 2016-07-05 DIAGNOSIS — Z0000 Encounter for general adult medical examination without abnormal findings: Secondary | ICD-10-CM | POA: Diagnosis not present

## 2016-07-05 DIAGNOSIS — I2583 Coronary atherosclerosis due to lipid rich plaque: Secondary | ICD-10-CM

## 2016-07-05 DIAGNOSIS — Z23 Encounter for immunization: Secondary | ICD-10-CM

## 2016-07-05 DIAGNOSIS — E785 Hyperlipidemia, unspecified: Secondary | ICD-10-CM | POA: Diagnosis not present

## 2016-07-05 DIAGNOSIS — N183 Chronic kidney disease, stage 3 (moderate): Secondary | ICD-10-CM

## 2016-07-05 DIAGNOSIS — R5382 Chronic fatigue, unspecified: Secondary | ICD-10-CM

## 2016-07-05 DIAGNOSIS — E1122 Type 2 diabetes mellitus with diabetic chronic kidney disease: Secondary | ICD-10-CM | POA: Diagnosis not present

## 2016-07-05 DIAGNOSIS — I10 Essential (primary) hypertension: Secondary | ICD-10-CM | POA: Diagnosis not present

## 2016-07-05 LAB — URINALYSIS, ROUTINE W REFLEX MICROSCOPIC
Bilirubin, UA: NEGATIVE
Glucose, UA: NEGATIVE
KETONES UA: NEGATIVE
LEUKOCYTES UA: NEGATIVE
NITRITE UA: NEGATIVE
PROTEIN UA: NEGATIVE
RBC UA: NEGATIVE
SPEC GRAV UA: 1.015 (ref 1.005–1.030)
Urobilinogen, Ur: 1 mg/dL (ref 0.2–1.0)
pH, UA: 7 (ref 5.0–7.5)

## 2016-07-05 MED ORDER — SIMVASTATIN 40 MG PO TABS: 40.0000 mg | ORAL_TABLET | Freq: Every day | ORAL | 4 refills | Status: DC

## 2016-07-05 MED ORDER — PANTOPRAZOLE SODIUM 40 MG PO TBEC: 40.0000 mg | DELAYED_RELEASE_TABLET | Freq: Every day | ORAL | 4 refills | Status: DC

## 2016-07-05 MED ORDER — LORAZEPAM 1 MG PO TABS: 1.0000 mg | ORAL_TABLET | Freq: Every day | ORAL | 1 refills | Status: DC | PRN

## 2016-07-05 MED ORDER — AMLODIPINE BESYLATE 5 MG PO TABS: 5.0000 mg | ORAL_TABLET | Freq: Every day | ORAL | 4 refills | Status: DC

## 2016-07-05 MED ORDER — HYDROCHLOROTHIAZIDE 12.5 MG PO CAPS: 12.5000 mg | ORAL_CAPSULE | Freq: Every day | ORAL | 4 refills | Status: DC

## 2016-07-05 MED ORDER — CLOPIDOGREL BISULFATE 75 MG PO TABS: 75.0000 mg | ORAL_TABLET | Freq: Every day | ORAL | 4 refills | Status: DC

## 2016-07-05 MED ORDER — PAROXETINE HCL 20 MG PO TABS: 20.0000 mg | ORAL_TABLET | Freq: Every day | ORAL | 4 refills | Status: DC

## 2016-07-05 NOTE — Assessment & Plan Note (Signed)
The current medical regimen is effective;  continue present plan and medications.  

## 2016-07-05 NOTE — Patient Instructions (Signed)
Pneumococcal Polysaccharide Vaccine: What You Need to Know  1. Why get vaccinated?  Vaccination can protect older adults (and some children and younger adults) from pneumococcal disease.  Pneumococcal disease is caused by bacteria that can spread from person to person through close contact. It can cause ear infections, and it can also lead to more serious infections of the:   · Lungs (pneumonia),  · Blood (bacteremia), and  · Covering of the brain and spinal cord (meningitis). Meningitis can cause deafness and brain damage, and it can be fatal.  Anyone can get pneumococcal disease, but children under 2 years of age, people with certain medical conditions, adults over 65 years of age, and cigarette smokers are at the highest risk.  About 18,000 older adults die each year from pneumococcal disease in the [place].  Treatment of pneumococcal infections with penicillin and other drugs used to be more effective. But some strains of the disease have become resistant to these drugs. This makes prevention of the disease, through vaccination, even more important.  2. Pneumococcal polysaccharide vaccine (PPSV23)  Pneumococcal polysaccharide vaccine (PPSV23) protects against 23 types of pneumococcal bacteria. It will not prevent all pneumococcal disease.  PPSV23 is recommended for:  · All adults 65 years of age and older,  · Anyone 2 through 71 years of age with certain long-term health problems,  · Anyone 2 through 71 years of age with a weakened immune system,  · Adults 19 through 71 years of age who smoke cigarettes or have asthma.  Most people need only one dose of PPSV. A second dose is recommended for certain high-risk groups. People 65 and older should get a dose even if they have gotten one or more doses of the vaccine before they turned 65.  Your healthcare provider can give you more information about these recommendations.  Most healthy adults develop protection within 2 to 3 weeks of getting the shot.  3. Some  people should not get this vaccine  · Anyone who has had a life-threatening allergic reaction to PPSV should not get another dose.  · Anyone who has a severe allergy to any component of PPSV should not receive it. Tell your provider if you have any severe allergies.  · Anyone who is moderately or severely ill when the shot is scheduled may be asked to wait until they recover before getting the vaccine. Someone with a mild illness can usually be vaccinated.  · Children less than 2 years of age should not receive this vaccine.  · There is no evidence that PPSV is harmful to either a pregnant woman or to her fetus. However, as a precaution, women who need the vaccine should be vaccinated before becoming pregnant, if possible.  4. Risks of a vaccine reaction  With any medicine, including vaccines, there is a chance of side effects. These are usually mild and go away on their own, but serious reactions are also possible.  About half of people who get PPSV have mild side effects, such as redness or pain where the shot is given, which go away within about two days.  Less than 1 out of 100 people develop a fever, muscle aches, or more severe local reactions.  Problems that could happen after any vaccine:  · People sometimes faint after a medical procedure, including vaccination. Sitting or lying down for about 15 minutes can help prevent fainting, and injuries caused by a fall. Tell your doctor if you feel dizzy, or have vision changes or   ringing in the ears.  · Some people get severe pain in the shoulder and have difficulty moving the arm where a shot was given. This happens very rarely.  · Any medication can cause a severe allergic reaction. Such reactions from a vaccine are very rare, estimated at about 1 in a million doses, and would happen within a few minutes to a few hours after the vaccination.  As with any medicine, there is a very remote chance of a vaccine causing a serious injury or death.  The safety of  vaccines is always being monitored. For more information, visit: www.cdc.gov/vaccinesafety/  5. What if there is a serious reaction?  What should I look for?  Look for anything that concerns you, such as signs of a severe allergic reaction, very high fever, or unusual behavior.   Signs of a severe allergic reaction can include hives, swelling of the face and throat, difficulty breathing, a fast heartbeat, dizziness, and weakness. These would usually start a few minutes to a few hours after the vaccination.  What should I do?  If you think it is a severe allergic reaction or other emergency that can't wait, call 9-1-1 or get to the nearest hospital. Otherwise, call your doctor.  Afterward, the reaction should be reported to the Vaccine Adverse Event Reporting System (VAERS). Your doctor might file this report, or you can do it yourself through the VAERS web site at www.vaers.hhs.gov, or by calling 1-800-822-7967.   VAERS does not give medical advice.  6. How can I learn more?  · Ask your doctor. He or she can give you the vaccine package insert or suggest other sources of information.  · Call your local or state health department.  · Contact the Centers for Disease Control and Prevention (CDC):    Call 1-800-232-4636 (1-800-CDC-INFO) or    Visit CDC's website at www.cdc.gov/vaccines  CDC Pneumococcal Polysaccharide Vaccine VIS (03/20/14)     This information is not intended to replace advice given to you by your health care provider. Make sure you discuss any questions you have with your health care provider.     Document Released: 09/10/2006 Document Revised: 12/04/2014 Document Reviewed: 03/23/2014  Elsevier Interactive Patient Education ©2016 [employer] Inc.

## 2016-07-05 NOTE — Progress Notes (Signed)
BP 114/72 (BP Location: Left Arm, Patient Position: Sitting, Cuff Size: Normal)   Temp 97.6 F (36.4 C)   Ht 5' 3.3" (1.608 m)   Wt 182 lb (82.6 kg)   SpO2 98%   BMI 31.93 kg/m    Subjective:    Patient ID: Laura Rojas, female    DOB: Mar 20, 1946, 70 y.o.   MRN: [id number]  HPI: Laura Rojas is a 70 y.o. female  Chief Complaint  Patient presents with  . Annual Exam  Patient was some Dupuytren's contracture changes right hand. Diabetes management being done at endocrinology and doing okay. Reviewed other medical problems patient doing well good control of blood pressure cholesterol Renal function staying stable No chest pain chest tightness no PND orthopnea   Relevant past medical, surgical, family and social history reviewed and updated as indicated. Interim medical history since our last visit reviewed. Allergies and medications reviewed and updated.  Review of Systems  Constitutional: Negative.   HENT: Negative.   Eyes: Negative.   Respiratory: Negative.   Cardiovascular: Negative.   Gastrointestinal: Negative.   Endocrine: Negative.   Genitourinary: Negative.   Musculoskeletal: Negative.   Skin: Negative.   Allergic/Immunologic: Negative.   Neurological: Negative.   Hematological: Negative.   Psychiatric/Behavioral: Negative.     Per HPI unless specifically indicated above     Objective:    BP 114/72 (BP Location: Left Arm, Patient Position: Sitting, Cuff Size: Normal)   Temp 97.6 F (36.4 C)   Ht 5' 3.3" (1.608 m)   Wt 182 lb (82.6 kg)   SpO2 98%   BMI 31.93 kg/m   Wt Readings from Last 3 Encounters:  07/05/16 182 lb (82.6 kg)  04/13/16 182 lb (82.6 kg)  04/04/16 181 lb (82.1 kg)    Physical Exam  Constitutional: She is oriented to person, place, and time. She appears well-developed and well-nourished.  HENT:  Head: Normocephalic and atraumatic.  Right Ear: External ear normal.  Left Ear: External ear normal.  Nose: Nose normal.    Mouth/Throat: Oropharynx is clear and moist.  Eyes: Conjunctivae and EOM are normal. Pupils are equal, round, and reactive to light.  Neck: Normal range of motion. Neck supple. Carotid bruit is not present.  Cardiovascular: Normal rate, regular rhythm and normal heart sounds.   No murmur heard. Pulmonary/Chest: Effort normal and breath sounds normal. She exhibits no mass. Right breast exhibits no mass, no skin change and no tenderness. Left breast exhibits no mass, no skin change and no tenderness. Breasts are symmetrical.  Abdominal: Soft. Bowel sounds are normal. There is no hepatosplenomegaly.  Musculoskeletal: Normal range of motion.  Neurological: She is alert and oriented to person, place, and time.  Skin: No rash noted.  Psychiatric: She has a normal mood and affect. Her behavior is normal. Judgment and thought content normal.    Results for orders placed or performed in visit on 07/05/16  Hemoglobin A1c  Result Value Ref Range   Hemoglobin A1C 8.8       Assessment & Plan:   Problem List Items Addressed This Visit      Cardiovascular and Mediastinum   CAD (coronary artery disease)    /The current medical regimen is effective;  continue present plan and medications.       Relevant Medications   simvastatin (ZOCOR) 40 MG tablet   hydrochlorothiazide (MICROZIDE) 12.5 MG capsule   clopidogrel (PLAVIX) 75 MG tablet   amLODipine (NORVASC) 5 MG tablet   HTN (hypertension)  The current medical regimen is effective;  continue present plan and medications.       Relevant Medications   simvastatin (ZOCOR) 40 MG tablet   hydrochlorothiazide (MICROZIDE) 12.5 MG capsule   amLODipine (NORVASC) 5 MG tablet     Genitourinary   CKD stage 3 due to type 2 diabetes mellitus ([place])    Followed by endocrinology and doing well      Relevant Medications   simvastatin (ZOCOR) 40 MG tablet     Other   Hyperlipidemia    The current medical regimen is effective;  continue present  plan and medications. We will continue simvastatin and amlodipine together this patient is taken these medications with no side effects or issues for years and years.       Relevant Medications   simvastatin (ZOCOR) 40 MG tablet   hydrochlorothiazide (MICROZIDE) 12.5 MG capsule   amLODipine (NORVASC) 5 MG tablet   Anxiety disorder   Relevant Medications   PARoxetine (PAXIL) 20 MG tablet    Other Visit Diagnoses    Well adult exam    -  Primary   Relevant Orders   Urinalysis, Routine w reflex microscopic (not at [facility])   CBC with Differential/Platelet   Comprehensive metabolic panel   Lipid Panel w/o Chol/HDL Ratio   TSH   Need for pneumococcal vaccination       Relevant Orders   Pneumococcal polysaccharide vaccine 23-valent greater than or equal to 2yo subcutaneous/IM (Completed)   Healthcare maintenance       Relevant Orders   Hepatitis C Antibody   Chronic fatigue       Relevant Orders   CBC with Differential/Platelet   TSH     Discuss anxiety and use of lorazepam patient taking 1 at nighttime every day. Discussed trying to reduce dosing by taking half a tablet at night some days and stretching out medications.  Follow up plan: Return in about 6 months (around 01/05/2017), or if symptoms worsen or fail to improve, for BMP,  Lipids, ALT, AST.

## 2016-07-05 NOTE — Assessment & Plan Note (Addendum)
The current medical regimen is effective;  continue present plan and medications. We will continue simvastatin and amlodipine together this patient is taken these medications with no side effects or issues for years and years.

## 2016-07-05 NOTE — Assessment & Plan Note (Signed)
Followed by endocrinology and doing well 

## 2016-07-06 ENCOUNTER — Encounter: Payer: Self-pay | Admitting: Family Medicine

## 2016-07-06 DIAGNOSIS — E1165 Type 2 diabetes mellitus with hyperglycemia: Secondary | ICD-10-CM | POA: Diagnosis not present

## 2016-07-06 DIAGNOSIS — Z794 Long term (current) use of insulin: Secondary | ICD-10-CM | POA: Diagnosis not present

## 2016-07-06 LAB — CBC WITH DIFFERENTIAL/PLATELET
BASOS: 1 %
Basophils Absolute: 0.1 10*3/uL (ref 0.0–0.2)
EOS (ABSOLUTE): 0.3 10*3/uL (ref 0.0–0.4)
Eos: 6 %
HEMOGLOBIN: 10.8 g/dL — AB (ref 11.1–15.9)
Hematocrit: 34.3 % (ref 34.0–46.6)
IMMATURE GRANS (ABS): 0 10*3/uL (ref 0.0–0.1)
Immature Granulocytes: 0 %
LYMPHS: 37 %
Lymphocytes Absolute: 2 10*3/uL (ref 0.7–3.1)
MCH: 27.2 pg (ref 26.6–33.0)
MCHC: 31.5 g/dL (ref 31.5–35.7)
MCV: 86 fL (ref 79–97)
MONOCYTES: 8 %
Monocytes Absolute: 0.4 10*3/uL (ref 0.1–0.9)
NEUTROS ABS: 2.6 10*3/uL (ref 1.4–7.0)
Neutrophils: 48 %
Platelets: 321 10*3/uL (ref 150–379)
RBC: 3.97 x10E6/uL (ref 3.77–5.28)
RDW: 15 % (ref 12.3–15.4)
WBC: 5.4 10*3/uL (ref 3.4–10.8)

## 2016-07-06 LAB — LIPID PANEL W/O CHOL/HDL RATIO
Cholesterol, Total: 149 mg/dL (ref 100–199)
HDL: 41 mg/dL (ref >39)
LDL Calculated: 59 mg/dL (ref 0–99)
Triglycerides: 246 mg/dL — ABNORMAL HIGH (ref 0–149)
VLDL CHOLESTEROL CAL: 49 mg/dL — AB (ref 5–40)

## 2016-07-06 LAB — COMPREHENSIVE METABOLIC PANEL
A/G RATIO: 1.7 (ref 1.2–2.2)
ALBUMIN: 4.1 g/dL (ref 3.5–4.8)
ALT: 14 IU/L (ref 0–32)
AST: 17 IU/L (ref 0–40)
Alkaline Phosphatase: 127 IU/L — ABNORMAL HIGH (ref 39–117)
BILIRUBIN TOTAL: 0.3 mg/dL (ref 0.0–1.2)
BUN / CREAT RATIO: 18 (ref 12–28)
BUN: 21 mg/dL (ref 8–27)
CHLORIDE: 100 mmol/L (ref 96–106)
CO2: 25 mmol/L (ref 18–29)
Calcium: 9.5 mg/dL (ref 8.7–10.3)
Creatinine, Ser: 1.2 mg/dL — ABNORMAL HIGH (ref 0.57–1.00)
GFR calc non Af Amer: 46 mL/min/{1.73_m2} — ABNORMAL LOW (ref >59)
GFR, EST AFRICAN AMERICAN: 53 mL/min/{1.73_m2} — AB (ref >59)
GLOBULIN, TOTAL: 2.4 g/dL (ref 1.5–4.5)
Glucose: 170 mg/dL — ABNORMAL HIGH (ref 65–99)
POTASSIUM: 4.4 mmol/L (ref 3.5–5.2)
Sodium: 140 mmol/L (ref 134–144)
TOTAL PROTEIN: 6.5 g/dL (ref 6.0–8.5)

## 2016-07-06 LAB — TSH: TSH: 3.45 u[IU]/mL (ref 0.450–4.500)

## 2016-07-06 LAB — HEMOGLOBIN A1C: HEMOGLOBIN A1C: 9.3

## 2016-07-06 LAB — HEPATITIS C ANTIBODY: Hep C Virus Ab: <0.1 s/co ratio (ref 0.0–0.9)

## 2016-07-13 DIAGNOSIS — E1169 Type 2 diabetes mellitus with other specified complication: Secondary | ICD-10-CM | POA: Diagnosis not present

## 2016-07-13 DIAGNOSIS — F329 Major depressive disorder, single episode, unspecified: Secondary | ICD-10-CM | POA: Diagnosis not present

## 2016-07-13 DIAGNOSIS — E538 Deficiency of other specified B group vitamins: Secondary | ICD-10-CM | POA: Diagnosis not present

## 2016-07-13 DIAGNOSIS — Z794 Long term (current) use of insulin: Secondary | ICD-10-CM | POA: Diagnosis not present

## 2016-07-13 DIAGNOSIS — I519 Heart disease, unspecified: Secondary | ICD-10-CM | POA: Diagnosis not present

## 2016-07-13 DIAGNOSIS — E1165 Type 2 diabetes mellitus with hyperglycemia: Secondary | ICD-10-CM | POA: Diagnosis not present

## 2016-07-17 ENCOUNTER — Other Ambulatory Visit: Payer: Self-pay | Admitting: Family Medicine

## 2016-07-17 DIAGNOSIS — F413 Other mixed anxiety disorders: Secondary | ICD-10-CM

## 2016-07-20 ENCOUNTER — Other Ambulatory Visit: Payer: Self-pay | Admitting: Family Medicine

## 2016-07-21 ENCOUNTER — Other Ambulatory Visit: Payer: Self-pay | Admitting: Family Medicine

## 2016-07-21 NOTE — Telephone Encounter (Signed)
RX already written by [facility]. Faxed to [facility]. Please refuse RX as it has already been done.

## 2016-08-08 ENCOUNTER — Ambulatory Visit: Payer: Medicare Other | Admitting: Family Medicine

## 2016-08-17 DIAGNOSIS — E1165 Type 2 diabetes mellitus with hyperglycemia: Secondary | ICD-10-CM | POA: Diagnosis not present

## 2016-08-17 DIAGNOSIS — E538 Deficiency of other specified B group vitamins: Secondary | ICD-10-CM | POA: Diagnosis not present

## 2016-08-17 DIAGNOSIS — E1169 Type 2 diabetes mellitus with other specified complication: Secondary | ICD-10-CM | POA: Diagnosis not present

## 2016-08-17 DIAGNOSIS — I519 Heart disease, unspecified: Secondary | ICD-10-CM | POA: Diagnosis not present

## 2016-08-17 DIAGNOSIS — E781 Pure hyperglyceridemia: Secondary | ICD-10-CM | POA: Diagnosis not present

## 2016-08-17 DIAGNOSIS — F329 Major depressive disorder, single episode, unspecified: Secondary | ICD-10-CM | POA: Diagnosis not present

## 2016-08-17 DIAGNOSIS — Z794 Long term (current) use of insulin: Secondary | ICD-10-CM | POA: Diagnosis not present

## 2016-08-17 DIAGNOSIS — E785 Hyperlipidemia, unspecified: Secondary | ICD-10-CM | POA: Diagnosis not present

## 2016-08-18 ENCOUNTER — Other Ambulatory Visit: Payer: Self-pay | Admitting: Family Medicine

## 2016-08-18 DIAGNOSIS — I10 Essential (primary) hypertension: Secondary | ICD-10-CM

## 2016-08-21 ENCOUNTER — Other Ambulatory Visit: Payer: Self-pay | Admitting: Family Medicine

## 2016-09-04 ENCOUNTER — Other Ambulatory Visit: Payer: Self-pay | Admitting: Family Medicine

## 2016-09-04 ENCOUNTER — Ambulatory Visit (INDEPENDENT_AMBULATORY_CARE_PROVIDER_SITE_OTHER): Payer: Medicare Other

## 2016-09-04 DIAGNOSIS — F419 Anxiety disorder, unspecified: Secondary | ICD-10-CM | POA: Diagnosis not present

## 2016-09-04 DIAGNOSIS — Z23 Encounter for immunization: Secondary | ICD-10-CM

## 2016-09-04 DIAGNOSIS — F5105 Insomnia due to other mental disorder: Secondary | ICD-10-CM | POA: Diagnosis not present

## 2016-09-04 DIAGNOSIS — F330 Major depressive disorder, recurrent, mild: Secondary | ICD-10-CM | POA: Diagnosis not present

## 2016-09-04 DIAGNOSIS — I10 Essential (primary) hypertension: Secondary | ICD-10-CM

## 2016-09-11 ENCOUNTER — Encounter: Payer: Self-pay | Admitting: Family Medicine

## 2016-09-11 ENCOUNTER — Ambulatory Visit (INDEPENDENT_AMBULATORY_CARE_PROVIDER_SITE_OTHER): Payer: Medicare Other | Admitting: Family Medicine

## 2016-09-11 DIAGNOSIS — I2510 Atherosclerotic heart disease of native coronary artery without angina pectoris: Secondary | ICD-10-CM | POA: Diagnosis not present

## 2016-09-11 DIAGNOSIS — I10 Essential (primary) hypertension: Secondary | ICD-10-CM

## 2016-09-11 DIAGNOSIS — F413 Other mixed anxiety disorders: Secondary | ICD-10-CM | POA: Diagnosis not present

## 2016-09-11 DIAGNOSIS — I2583 Coronary atherosclerosis due to lipid rich plaque: Secondary | ICD-10-CM

## 2016-09-11 NOTE — Assessment & Plan Note (Signed)
The current medical regimen is effective;  continue present plan and medications.  

## 2016-09-11 NOTE — Progress Notes (Addendum)
BP 118/71 (BP Location: Left Arm, Patient Position: Sitting, Cuff Size: Normal)   Pulse 60   Temp 97.6 F (36.4 C)   Ht 5' 3.5" (1.613 m)   Wt 181 lb 3.2 oz (82.2 kg)   SpO2 95%   BMI 31.59 kg/m    Subjective:    Patient ID: Laura Rojas, female    DOB: [birth year], 70 y.o.   MRN: [id number]  HPI: Laura Rojas is a 70 y.o. female  Chief Complaint  Patient presents with  . Depression    Been seeing Dr. Nicolasa Ducking. Was put on Trazadone and patient states it makes her crazy. She can't take.   . Insomnia  Patient concerned about side effects from taking double dose trazodone. Having marked agitation and irritability. Friends in her church group told her not to come that she was to remain with that medicine.  Patient still struggling to sleep. Mentioned she had some melatonin which I said was okay to use.  Patient with long history of anxiety struggling since the 50s after her mother's death from cancer.  Chest pain and coronary artery disease stable Diabetes followed by endocrinology and stable Cholesterol stable  Relevant past medical, surgical, family and social history reviewed and updated as indicated. Interim medical history since our last visit reviewed. Allergies and medications reviewed and updated.  Review of Systems  Constitutional: Negative.   Respiratory: Negative.   Cardiovascular: Negative.     Per HPI unless specifically indicated above     Objective:    BP 118/71 (BP Location: Left Arm, Patient Position: Sitting, Cuff Size: Normal)   Pulse 60   Temp 97.6 F (36.4 C)   Ht 5' 3.5" (1.613 m)   Wt 181 lb 3.2 oz (82.2 kg)   SpO2 95%   BMI 31.59 kg/m   Wt Readings from Last 3 Encounters:  09/11/16 181 lb 3.2 oz (82.2 kg)  07/05/16 182 lb (82.6 kg)  04/13/16 182 lb (82.6 kg)    Physical Exam  Constitutional: She is oriented to person, place, and time. She appears well-developed and well-nourished. No distress.  HENT:  Head: Normocephalic and  atraumatic.  Right Ear: Hearing normal.  Left Ear: Hearing normal.  Nose: Nose normal.  Eyes: Conjunctivae and lids are normal. Right eye exhibits no discharge. Left eye exhibits no discharge. No scleral icterus.  Cardiovascular: Normal rate, regular rhythm and normal heart sounds.   Pulmonary/Chest: Effort normal and breath sounds normal. No respiratory distress.  Musculoskeletal: Normal range of motion.  Neurological: She is alert and oriented to person, place, and time.  Skin: Skin is intact. No rash noted.  Psychiatric: She has a normal mood and affect. Her speech is normal and behavior is normal. Judgment and thought content normal. Cognition and memory are normal.    Results for orders placed or performed in visit on 08/09/16  Hemoglobin A1c  Result Value Ref Range   Hemoglobin A1C 9.3       Assessment & Plan:   Problem List Items Addressed This Visit      Cardiovascular and Mediastinum   CAD (coronary artery disease)    The current medical regimen is effective;  continue present plan and medications.       HTN (hypertension)    The current medical regimen is effective;  continue present plan and medications.         Other   Anxiety disorder    Keep appointment with Dr. Nicolasa Ducking for further evaluation and treatment  Other Visit Diagnoses   None.     Reviewed limitations of anxiety treatment especially with controlled drugs and with getting older. Follow up plan: Return for As scheduled.

## 2016-09-11 NOTE — Assessment & Plan Note (Signed)
Keep appointment with Dr. Nicolasa Ducking for further evaluation and treatment

## 2016-09-12 ENCOUNTER — Other Ambulatory Visit: Payer: Self-pay | Admitting: Family Medicine

## 2016-09-12 DIAGNOSIS — I10 Essential (primary) hypertension: Secondary | ICD-10-CM

## 2016-09-19 DIAGNOSIS — F5105 Insomnia due to other mental disorder: Secondary | ICD-10-CM | POA: Diagnosis not present

## 2016-09-19 DIAGNOSIS — F419 Anxiety disorder, unspecified: Secondary | ICD-10-CM | POA: Diagnosis not present

## 2016-09-19 DIAGNOSIS — F330 Major depressive disorder, recurrent, mild: Secondary | ICD-10-CM | POA: Diagnosis not present

## 2016-09-20 DIAGNOSIS — F419 Anxiety disorder, unspecified: Secondary | ICD-10-CM | POA: Diagnosis not present

## 2016-09-20 DIAGNOSIS — F330 Major depressive disorder, recurrent, mild: Secondary | ICD-10-CM | POA: Diagnosis not present

## 2016-09-25 ENCOUNTER — Telehealth: Payer: Self-pay | Admitting: Family Medicine

## 2016-09-25 NOTE — Telephone Encounter (Signed)
Please get patient scheduled.  °

## 2016-09-25 NOTE — Telephone Encounter (Signed)
LMOM to call back

## 2016-09-25 NOTE — Telephone Encounter (Signed)
Routing to provider for advice.

## 2016-09-25 NOTE — Telephone Encounter (Signed)
Pt called and stated she was really red,itchy and burning  under her breast and she would like to have a cream sent to [facility].

## 2016-09-25 NOTE — Telephone Encounter (Signed)
Needs appt

## 2016-09-26 ENCOUNTER — Encounter: Payer: Self-pay | Admitting: Family Medicine

## 2016-09-26 ENCOUNTER — Ambulatory Visit (INDEPENDENT_AMBULATORY_CARE_PROVIDER_SITE_OTHER): Payer: Medicare Other | Admitting: Family Medicine

## 2016-09-26 VITALS — BP 122/79 | HR 60 | Temp 98.0°F | Wt 178.0 lb

## 2016-09-26 DIAGNOSIS — B372 Candidiasis of skin and nail: Secondary | ICD-10-CM | POA: Diagnosis not present

## 2016-09-26 DIAGNOSIS — I2583 Coronary atherosclerosis due to lipid rich plaque: Secondary | ICD-10-CM

## 2016-09-26 DIAGNOSIS — S39011A Strain of muscle, fascia and tendon of abdomen, initial encounter: Secondary | ICD-10-CM

## 2016-09-26 DIAGNOSIS — I2510 Atherosclerotic heart disease of native coronary artery without angina pectoris: Secondary | ICD-10-CM

## 2016-09-26 MED ORDER — NYSTATIN 100000 UNIT/GM EX CREA: 1.0000 "application " | TOPICAL_CREAM | Freq: Two times a day (BID) | CUTANEOUS | 2 refills | Status: DC

## 2016-09-26 MED ORDER — NYSTATIN 100000 UNIT/GM EX POWD: Freq: Two times a day (BID) | CUTANEOUS | 2 refills | Status: DC

## 2016-09-26 NOTE — Telephone Encounter (Signed)
Patient scheduled an appointment for today

## 2016-09-26 NOTE — Patient Instructions (Signed)
Follow up as needed

## 2016-09-26 NOTE — Progress Notes (Signed)
BP 122/79   Pulse 60   Temp 98 F (36.7 C)   Wt 178 lb (80.7 kg)   SpO2 99%   BMI 31.04 kg/m    Subjective:    Patient ID: Laura Rojas, female    DOB: 1946/10/16, 70 y.o.   MRN: [id number]  HPI: Laura Rojas is a 70 y.o. female  Chief Complaint  Patient presents with  . Rash    x 1 week. red, itchy, burning rash under her left breast. No changes in soaps or detergants, had something similar before.  . side pain    happened yesterday on her left side. was very tender and hurt to walk but it is better today.   Patient presents with a rash under left breast x 1 week. State she gets these periodically and it is itchy and burns and the skin becomes raw. Has tried cortisone, monistat, and powders over the counter with minimal relief. Does state her sugars have been poorly controlled lately as they are adjusting her medications. Has been sweating more than usual as well - states this has been while trying to find a new medicine for sleep, thinks the meds are causing night sweats.   Also c/o left side pain that started yesterday after moving some heavy boxes. Denies N/V/D, or abdominal pain. No fevers. Movement, especially lateral bending, reproduces pain. Feeling better today but still some achy. No urinary symptoms.   Relevant past medical, surgical, family and social history reviewed and updated as indicated. Interim medical history since our last visit reviewed. Allergies and medications reviewed and updated.  Review of Systems  Constitutional: Negative.   HENT: Negative.   Respiratory: Negative.   Cardiovascular: Negative.   Gastrointestinal: Positive for abdominal pain.  Genitourinary: Negative.   Musculoskeletal: Negative.   Skin: Positive for rash.  Neurological: Negative.   Psychiatric/Behavioral: Negative.     Per HPI unless specifically indicated above     Objective:    BP 122/79   Pulse 60   Temp 98 F (36.7 C)   Wt 178 lb (80.7 kg)   SpO2 99%   BMI  31.04 kg/m   Wt Readings from Last 3 Encounters:  09/26/16 178 lb (80.7 kg)  09/11/16 181 lb 3.2 oz (82.2 kg)  07/05/16 182 lb (82.6 kg)    Physical Exam  Constitutional: She is oriented to person, place, and time. She appears well-developed and well-nourished.  HENT:  Head: Atraumatic.  Eyes: Conjunctivae are normal. Pupils are equal, round, and reactive to light. No scleral icterus.  Neck: Normal range of motion. Neck supple.  Cardiovascular: Normal rate, regular rhythm and normal heart sounds.   Pulmonary/Chest: Effort normal and breath sounds normal. No respiratory distress.  Abdominal: Soft. Bowel sounds are normal. She exhibits no distension. There is no tenderness. There is no guarding.  Musculoskeletal: Normal range of motion. She exhibits tenderness (ttp over left oblique muscles).  Lymphadenopathy:    She has no cervical adenopathy.  Neurological: She is alert and oriented to person, place, and time.  Skin: Skin is warm and dry.  Psychiatric: She has a normal mood and affect. Her behavior is normal.    Results for orders placed or performed in visit on 08/09/16  Hemoglobin A1c  Result Value Ref Range   Hemoglobin A1C 9.3       Assessment & Plan:   Problem List Items Addressed This Visit    None    Visit Diagnoses    Cutaneous candidiasis    -  Primary   Likely given sweating and hyperglycemia. Sent nystatin cream and powder, discussed to use both several times daily. Keep area clean and dry   Relevant Medications   nystatin (NYSTATIN) powder   nystatin cream (MYCOSTATIN)   Strain of abdominal muscle, initial encounter       Gentle stretches, tylenol, massage, epsom salt soaks. Follow up if no improvement       Follow up plan: Return if symptoms worsen or fail to improve.

## 2016-10-06 DIAGNOSIS — F419 Anxiety disorder, unspecified: Secondary | ICD-10-CM | POA: Diagnosis not present

## 2016-10-06 DIAGNOSIS — F330 Major depressive disorder, recurrent, mild: Secondary | ICD-10-CM | POA: Diagnosis not present

## 2016-10-09 DIAGNOSIS — F5105 Insomnia due to other mental disorder: Secondary | ICD-10-CM | POA: Diagnosis not present

## 2016-10-09 DIAGNOSIS — F419 Anxiety disorder, unspecified: Secondary | ICD-10-CM | POA: Diagnosis not present

## 2016-10-09 DIAGNOSIS — F330 Major depressive disorder, recurrent, mild: Secondary | ICD-10-CM | POA: Diagnosis not present

## 2016-10-27 DIAGNOSIS — F419 Anxiety disorder, unspecified: Secondary | ICD-10-CM | POA: Diagnosis not present

## 2016-10-27 DIAGNOSIS — F330 Major depressive disorder, recurrent, mild: Secondary | ICD-10-CM | POA: Diagnosis not present

## 2016-11-03 DIAGNOSIS — Z794 Long term (current) use of insulin: Secondary | ICD-10-CM | POA: Diagnosis not present

## 2016-11-03 DIAGNOSIS — E1165 Type 2 diabetes mellitus with hyperglycemia: Secondary | ICD-10-CM | POA: Diagnosis not present

## 2016-11-03 DIAGNOSIS — E781 Pure hyperglyceridemia: Secondary | ICD-10-CM | POA: Diagnosis not present

## 2016-11-07 DIAGNOSIS — I519 Heart disease, unspecified: Secondary | ICD-10-CM | POA: Diagnosis not present

## 2016-11-07 DIAGNOSIS — E785 Hyperlipidemia, unspecified: Secondary | ICD-10-CM | POA: Diagnosis not present

## 2016-11-07 DIAGNOSIS — E1169 Type 2 diabetes mellitus with other specified complication: Secondary | ICD-10-CM | POA: Diagnosis not present

## 2016-11-07 DIAGNOSIS — E1165 Type 2 diabetes mellitus with hyperglycemia: Secondary | ICD-10-CM | POA: Diagnosis not present

## 2016-11-07 DIAGNOSIS — E781 Pure hyperglyceridemia: Secondary | ICD-10-CM | POA: Diagnosis not present

## 2016-11-07 DIAGNOSIS — E538 Deficiency of other specified B group vitamins: Secondary | ICD-10-CM | POA: Diagnosis not present

## 2016-11-07 DIAGNOSIS — Z794 Long term (current) use of insulin: Secondary | ICD-10-CM | POA: Diagnosis not present

## 2016-11-07 DIAGNOSIS — F329 Major depressive disorder, single episode, unspecified: Secondary | ICD-10-CM | POA: Diagnosis not present

## 2016-12-26 DIAGNOSIS — Z794 Long term (current) use of insulin: Secondary | ICD-10-CM | POA: Diagnosis not present

## 2016-12-26 DIAGNOSIS — E1165 Type 2 diabetes mellitus with hyperglycemia: Secondary | ICD-10-CM | POA: Diagnosis not present

## 2016-12-26 DIAGNOSIS — F330 Major depressive disorder, recurrent, mild: Secondary | ICD-10-CM | POA: Diagnosis not present

## 2016-12-26 DIAGNOSIS — F419 Anxiety disorder, unspecified: Secondary | ICD-10-CM | POA: Diagnosis not present

## 2016-12-26 DIAGNOSIS — F5105 Insomnia due to other mental disorder: Secondary | ICD-10-CM | POA: Diagnosis not present

## 2017-01-02 DIAGNOSIS — E538 Deficiency of other specified B group vitamins: Secondary | ICD-10-CM | POA: Diagnosis not present

## 2017-01-02 DIAGNOSIS — F329 Major depressive disorder, single episode, unspecified: Secondary | ICD-10-CM | POA: Diagnosis not present

## 2017-01-02 DIAGNOSIS — E781 Pure hyperglyceridemia: Secondary | ICD-10-CM | POA: Diagnosis not present

## 2017-01-02 DIAGNOSIS — I519 Heart disease, unspecified: Secondary | ICD-10-CM | POA: Diagnosis not present

## 2017-01-02 DIAGNOSIS — Z794 Long term (current) use of insulin: Secondary | ICD-10-CM | POA: Diagnosis not present

## 2017-01-02 DIAGNOSIS — E1165 Type 2 diabetes mellitus with hyperglycemia: Secondary | ICD-10-CM | POA: Diagnosis not present

## 2017-01-02 DIAGNOSIS — E1169 Type 2 diabetes mellitus with other specified complication: Secondary | ICD-10-CM | POA: Diagnosis not present

## 2017-01-02 DIAGNOSIS — E785 Hyperlipidemia, unspecified: Secondary | ICD-10-CM | POA: Diagnosis not present

## 2017-01-10 ENCOUNTER — Ambulatory Visit: Payer: Medicare Other | Admitting: Family Medicine

## 2017-01-23 ENCOUNTER — Encounter: Payer: Self-pay | Admitting: Family Medicine

## 2017-01-23 ENCOUNTER — Ambulatory Visit (INDEPENDENT_AMBULATORY_CARE_PROVIDER_SITE_OTHER): Payer: Medicare Other | Admitting: Family Medicine

## 2017-01-23 VITALS — BP 118/60 | HR 63 | Ht 64.0 in | Wt 183.0 lb

## 2017-01-23 DIAGNOSIS — I10 Essential (primary) hypertension: Secondary | ICD-10-CM

## 2017-01-23 DIAGNOSIS — E785 Hyperlipidemia, unspecified: Secondary | ICD-10-CM | POA: Diagnosis not present

## 2017-01-23 DIAGNOSIS — I2510 Atherosclerotic heart disease of native coronary artery without angina pectoris: Secondary | ICD-10-CM | POA: Diagnosis not present

## 2017-01-23 DIAGNOSIS — E118 Type 2 diabetes mellitus with unspecified complications: Secondary | ICD-10-CM | POA: Diagnosis not present

## 2017-01-23 DIAGNOSIS — Z794 Long term (current) use of insulin: Secondary | ICD-10-CM

## 2017-01-23 DIAGNOSIS — I2583 Coronary atherosclerosis due to lipid rich plaque: Secondary | ICD-10-CM

## 2017-01-23 LAB — LP+ALT+AST PICCOLO, WAIVED
ALT (SGPT) PICCOLO, WAIVED: 23 U/L (ref 10–47)
AST (SGOT) PICCOLO, WAIVED: 24 U/L (ref 11–38)
Chol/HDL Ratio Piccolo,Waive: 3.3 mg/dL
Cholesterol Piccolo, Waived: 157 mg/dL (ref <200)
HDL CHOL PICCOLO, WAIVED: 48 mg/dL — AB (ref >59)
LDL Chol Calc Piccolo Waived: 59 mg/dL (ref <100)
Triglycerides Piccolo,Waived: 252 mg/dL — ABNORMAL HIGH (ref <150)
VLDL CHOL CALC PICCOLO,WAIVE: 50 mg/dL — AB (ref <30)

## 2017-01-23 LAB — BAYER DCA HB A1C WAIVED: HB A1C: 8.5 % — AB (ref <7.0)

## 2017-01-23 MED ORDER — BENZONATATE 100 MG PO CAPS: 100.0000 mg | ORAL_CAPSULE | Freq: Two times a day (BID) | ORAL | 1 refills | Status: DC | PRN

## 2017-01-23 NOTE — Assessment & Plan Note (Signed)
The current medical regimen is effective;  continue present plan and medications.  

## 2017-01-23 NOTE — Progress Notes (Signed)
BP 118/60 (BP Location: Left Arm)   Pulse 63   Ht 5\' 4"  (1.626 m)   Wt 183 lb (83 kg)   SpO2 98%   BMI 31.41 kg/m    Subjective:    Patient ID: Laura Rojas, female    DOB: 17-Nov-1946, 71 y.o.   MRN: [id number]  HPI: Laura Rojas is a 71 y.o. female  Chief Complaint  Patient presents with  . Follow-up  . Diabetes  . Depression    side effects.   Patient follow-up for multiple medical problems. Diabetes followed by endocrinology and stable A1c done here was 8.5. Otherwise diabetes medicine stable. Also followed by psychiatry for depression and nerves which is doing okay having some issues with sleep which is doing better with lorazepam now is on Remeron. Reflux otherwise stable. Has occasional cough that wants to have some Tessalon Perles to take before going to church or some other similar situation. Takes simvastatin without problems.  Relevant past medical, surgical, family and social history reviewed and updated as indicated. Interim medical history since our last visit reviewed. Allergies and medications reviewed and updated.  Review of Systems  Constitutional: Negative.   Respiratory: Negative.   Cardiovascular: Negative.     Per HPI unless specifically indicated above     Objective:    BP 118/60 (BP Location: Left Arm)   Pulse 63   Ht 5\' 4"  (1.626 m)   Wt 183 lb (83 kg)   SpO2 98%   BMI 31.41 kg/m   Wt Readings from Last 3 Encounters:  01/23/17 183 lb (83 kg)  09/26/16 178 lb (80.7 kg)  09/11/16 181 lb 3.2 oz (82.2 kg)    Physical Exam  Constitutional: She is oriented to person, place, and time. She appears well-developed and well-nourished.  HENT:  Head: Normocephalic and atraumatic.  Eyes: Conjunctivae and EOM are normal.  Neck: Normal range of motion.  Cardiovascular: Normal rate, regular rhythm and normal heart sounds.   Pulmonary/Chest: Effort normal and breath sounds normal.  Musculoskeletal: Normal range of motion.  Neurological: She  is alert and oriented to person, place, and time.  Skin: No erythema.  Psychiatric: She has a normal mood and affect. Her behavior is normal. Judgment and thought content normal.    Results for orders placed or performed in visit on 08/09/16  Hemoglobin A1c  Result Value Ref Range   Hemoglobin A1C 9.3       Assessment & Plan:   Problem List Items Addressed This Visit      Cardiovascular and Mediastinum   CAD (coronary artery disease)    The current medical regimen is effective;  continue present plan and medications.       HTN (hypertension)    The current medical regimen is effective;  continue present plan and medications.       Relevant Orders   Basic metabolic panel   LP+ALT+AST Piccolo, Waived   Bayer DCA Hb A1c Waived (STAT)     Endocrine   DM (diabetes mellitus), type 2 with complications ([place]) - Primary   Relevant Orders   Basic metabolic panel   LP+ALT+AST Piccolo, Waived   Bayer DCA Hb A1c Waived (STAT)     Other   Hyperlipidemia    The current medical regimen is effective;  continue present plan and medications.       Relevant Orders   Basic metabolic panel   LP+ALT+AST Piccolo, Waived   Bayer DCA Hb A1c Waived (STAT)  Follow up plan: Return in about 6 months (around 07/23/2017) for Physical Exam....NO  a1c.

## 2017-01-24 ENCOUNTER — Encounter: Payer: Self-pay | Admitting: Family Medicine

## 2017-01-24 LAB — BASIC METABOLIC PANEL
BUN/Creatinine Ratio: 13 (ref 12–28)
BUN: 15 mg/dL (ref 8–27)
CHLORIDE: 103 mmol/L (ref 96–106)
CO2: 22 mmol/L (ref 18–29)
Calcium: 9.4 mg/dL (ref 8.7–10.3)
Creatinine, Ser: 1.19 mg/dL — ABNORMAL HIGH (ref 0.57–1.00)
GFR, EST AFRICAN AMERICAN: 53 mL/min/{1.73_m2} — AB (ref >59)
GFR, EST NON AFRICAN AMERICAN: 46 mL/min/{1.73_m2} — AB (ref >59)
Glucose: 239 mg/dL — ABNORMAL HIGH (ref 65–99)
POTASSIUM: 4.6 mmol/L (ref 3.5–5.2)
SODIUM: 142 mmol/L (ref 134–144)

## 2017-03-12 ENCOUNTER — Telehealth: Payer: Self-pay | Admitting: Cardiovascular Disease

## 2017-03-12 NOTE — Telephone Encounter (Signed)
Spoke to patient  c/o swelling, no chest pain , little shortness of breathe  Does not disturb sleep. appointment moved up to 03/20/16 from 4/30 /18

## 2017-03-12 NOTE — Telephone Encounter (Signed)
New Message     Pt c/o swelling: STAT is pt has developed SOB within 24 hours  1. How long have you been experiencing swelling?  A week  2. Where is the swelling located? legs  3.  Are you currently taking a "fluid pill"?yes 4.  Are you currently SOB?  Some   5.  Have you traveled recently? no

## 2017-03-20 ENCOUNTER — Ambulatory Visit (INDEPENDENT_AMBULATORY_CARE_PROVIDER_SITE_OTHER): Payer: Medicare Other | Admitting: Cardiovascular Disease

## 2017-03-20 ENCOUNTER — Encounter: Payer: Self-pay | Admitting: Cardiovascular Disease

## 2017-03-20 VITALS — BP 104/78 | HR 58 | Ht 64.0 in | Wt 184.0 lb

## 2017-03-20 DIAGNOSIS — Z794 Long term (current) use of insulin: Secondary | ICD-10-CM

## 2017-03-20 DIAGNOSIS — R0602 Shortness of breath: Secondary | ICD-10-CM

## 2017-03-20 DIAGNOSIS — I771 Stricture of artery: Secondary | ICD-10-CM

## 2017-03-20 DIAGNOSIS — E782 Mixed hyperlipidemia: Secondary | ICD-10-CM | POA: Diagnosis not present

## 2017-03-20 DIAGNOSIS — I2510 Atherosclerotic heart disease of native coronary artery without angina pectoris: Secondary | ICD-10-CM | POA: Diagnosis not present

## 2017-03-20 DIAGNOSIS — E118 Type 2 diabetes mellitus with unspecified complications: Secondary | ICD-10-CM

## 2017-03-20 DIAGNOSIS — I10 Essential (primary) hypertension: Secondary | ICD-10-CM

## 2017-03-20 DIAGNOSIS — I2583 Coronary atherosclerosis due to lipid rich plaque: Secondary | ICD-10-CM

## 2017-03-20 MED ORDER — AMLODIPINE BESYLATE 2.5 MG PO TABS: 2.5000 mg | ORAL_TABLET | Freq: Every day | ORAL | 3 refills | Status: DC

## 2017-03-20 NOTE — Progress Notes (Signed)
Patient ID: Laura Rojas, female   DOB: February 03, 1946, 71 y.o.   MRN: [id number]    Cardiology Office Note    Date:  03/20/2017   ID:  Tomoko, Sandra 1946/01/15, MRN [id number]  PCP:  Golden Pop, MD  Cardiologist:   Sanda Klein, MD   Chief complaint: Weakness and fatigue   History of Present Illness:  Laura Rojas is a 71 y.o. female with coronary disease, PAD, DM, HTN and hyperlipidemia.  At her appointment a year ago she was complaining of weakness and fatigue and had rather low heart rate and blood pressure. We discontinue nitrates and reduce her dose of beta blocker and she had immediate improvement in her level of energy. Over the last few weeks though she has noticed mild ankle swelling and possibly a little more exertional dyspnea. NYHA functional class II. She has not had angina pectoris. She denies focal neurological deficits. Glycemic control has deteriorated a little bit with her most recent hemoglobin A1c just over 7%.   She had stents placed in the proximal LAD and proximal RCA (2007, 3.0 x 28 mm drug-eluting Cypher stent to RCA, 3.5 x 33 mm drug-eluting Cypher stent and proximal LAD). She is known to have an untreated stenosis in the very distal portion of the LAD and a 60% "jailed" ostial diagonal stenosis at the level of the proximal LAD stent. She has preserved LV systolic function and has never had congestive heart failure. Her most recent functional study was a nuclear stress test in 2013 that showed normal perfusion and normal LVEF. Her most recent cardiac catheterization was performed in 2009 and both the LAD and RCA stents were found to be widely patent. Significant comorbidities include diabetes mellitus type 2, hyperlipidemia and hypertension.    Past Medical History:  Diagnosis Date  . CAD (coronary artery disease)   . CAD (coronary artery disease) 12/31/2013   2007, 3.0 x 28 mm drug-eluting Cypher stent to RCA, 3.5 x 33 mm drug-eluting Cypher stent and  proximal LAD) Consent by 2009 Normal perfusion by mildly 2013   . Dyslipidemia   . Subclavian artery stenosis, left ([place]) 01/01/2015    Past Surgical History:  Procedure Laterality Date  . BREAST BIOPSY Right 1970   EXCISIONAL - NEG  . BREAST BIOPSY Left 1973   EXCISIONAL - NEG  . CARDIAC CATHETERIZATION  08/25/2008   patent stents  . CAROTID DUPLEX  10/2006   NORMAL CAROTID ARTERY DUPLEX  . CORONARY ANGIOPLASTY WITH STENT PLACEMENT  09/17/2006   PTCA & stenting LAD  . ESOPHAGOGASTRODUODENOSCOPY N/A 04/27/2015   Procedure: ESOPHAGOGASTRODUODENOSCOPY (EGD);  Surgeon: Lucilla Lame, MD;  Location: [facility] ENDOSCOPY;  Service: Endoscopy;  Laterality: N/A;  . NM MYOCAR MULTIPLE W/SPECT  11/2011   EF 72%.NORMAL MYOCARDIAL PERFUSION STUDY  . REPLACEMENT TOTAL KNEE  2003 & 2004  . TRANSTHORACIC ECHOCARDIOGRAM  09/2006   EF =>55%. IMPAIRED LV RELAXATION. MILD MITRAL ANNULAR CALCIFICATION. AV-MILDLY SCLEROTIC. NO AS.  Marland Kitchen VAGINAL HYSTERECTOMY  1983    Current Medications: Outpatient Medications Prior to Visit  Medication Sig Dispense Refill  . amoxicillin (AMOXIL) 500 MG capsule TAKE 4 CAPSULES BY MOUTH 1 HOUR PRIOR TO DENTAL APPOINTMENT  1  . aspirin 81 MG tablet Take 81 mg by mouth daily.    . benzonatate (TESSALON) 100 MG capsule Take 1 capsule (100 mg total) by mouth 2 (two) times daily as needed for cough. 30 capsule 1  . carvedilol (COREG) 6.25 MG tablet Take 1 tablet (  6.25 mg total) by mouth 2 (two) times daily. 60 tablet 11  . clopidogrel (PLAVIX) 75 MG tablet Take 1 tablet (75 mg total) by mouth daily. 90 tablet 4  . hydrochlorothiazide (MICROZIDE) 12.5 MG capsule Take 1 capsule (12.5 mg total) by mouth daily. 90 capsule 4  . insulin aspart (NOVOLOG) 100 UNIT/ML injection Inject 6 Units into the skin 3 (three) times daily before meals.    Marland Kitchen losartan (COZAAR) 100 MG tablet TAKE 1 TABLET (100 MG TOTAL) BY MOUTH DAILY. 90 tablet 4  . Magnesium 400 MG TABS Take 1 tablet by mouth daily.    .  metFORMIN (GLUCOPHAGE) 1000 MG tablet Take 500 mg by mouth 2 (two) times daily.     . mirtazapine (REMERON) 15 MG tablet Take 3.5 mg by mouth daily.     Marland Kitchen nystatin (NYSTATIN) powder Apply topically 2 (two) times daily. 15 g 2  . nystatin cream (MYCOSTATIN) Apply 1 application topically 2 (two) times daily. 30 g 2  . Omega-3 Fatty Acids (FISH OIL) 1200 MG CAPS Take 1,200 mg by mouth daily.    . pantoprazole (PROTONIX) 40 MG tablet Take 1 tablet (40 mg total) by mouth daily. 90 tablet 4  . sertraline (ZOLOFT) 50 MG tablet Take 25 mg by mouth daily.    . simvastatin (ZOCOR) 40 MG tablet Take 1 tablet (40 mg total) by mouth daily at 6 PM. 90 tablet 4  . amLODipine (NORVASC) 5 MG tablet Take 1 tablet (5 mg total) by mouth daily. 90 tablet 4   No facility-administered medications prior to visit.      Allergies:   Compazine [prochlorperazine edisylate]; Altace [ramipril]; Crestor [rosuvastatin calcium]; Reglan [metoclopramide]; Sulfa antibiotics; Actos [pioglitazone]; Amlodipine; Latex; and Lipitor [atorvastatin]   Social History   Social History  . Marital status: Married    Spouse name: N/A  . Number of children: N/A  . Years of education: N/A   Social History Main Topics  . Smoking status: Never Smoker  . Smokeless tobacco: Never Used  . Alcohol use No  . Drug use: No  . Sexual activity: Not Asked   Other Topics Concern  . None   Social History Narrative  . None     Family History:  The patient's family history includes Breast cancer (age of onset: 60) in her mother; Cancer in her mother and sister; Diabetes in her sister; Heart disease in her maternal grandfather and maternal grandmother.   ROS:   Please see the history of present illness.    ROS All other systems reviewed and are negative.   PHYSICAL EXAM:   VS:  BP 104/78 (BP Location: Right Arm, Patient Position: Sitting, Cuff Size: Normal)   Pulse (!) 58   Ht 5\' 4"  (1.626 m)   Wt 83.5 kg (184 lb)   BMI 31.58 kg/m      GEN: Well nourished, well developed, in no acute distress  HEENT: normal  Neck: no JVD, carotid bruits, or masses Cardiac: RRR; no murmurs, rubs, or gallops,no edema  Respiratory:  clear to auscultation bilaterally, normal work of breathing GI: soft, nontender, nondistended, + BS MS: no deformity or atrophy  Skin: warm and dry, no rash Neuro:  Alert and Oriented x 3, Strength and sensation are intact Psych: euthymic mood, full affect  Wt Readings from Last 3 Encounters:  03/20/17 83.5 kg (184 lb)  01/23/17 83 kg (183 lb)  09/26/16 80.7 kg (178 lb)      Studies/Labs Reviewed:  EKG:  EKG is ordered today.  The ekg ordered today demonstrates: Normal sinus rhythm, generalized low voltage QRS, poor R-wave progression across the anterior precordium, nonspecific minimal downsloping ST depression and T-wave inversion in leads V3-V4. The tracing is essentially identical to one year ago and the year before that  Recent Labs: 07/05/2016: Platelets 321; TSH 3.450 01/23/2017: ALT (SGPT) Piccolo, Waived 23; BUN 15; Creatinine, Ser 1.19; Potassium 4.6; Sodium 142   Lipid Panel    Component Value Date/Time   CHOL 157 01/23/2017 1057   TRIG 252 (H) 01/23/2017 1057   HDL 41 07/05/2016 0848   CHOLHDL 3.0 05/26/2015 1402   VLDL 50 (H) 01/23/2017 1057   LDLCALC 59 07/05/2016 0848  01/23/2017 Calculated LDL 59 HDL 48  ASSESSMENT:    1. Coronary artery disease involving native coronary artery of native heart without angina pectoris   2. Shortness of breath   3. Essential hypertension   4. Type 2 diabetes mellitus with complication, with long-term current use of insulin ([place])   5. Mixed hyperlipidemia   6. Subclavian artery stenosis, left ([facility])      PLAN:  In order of problems listed above:  1. CAD: Asymptomatic Even after we curtailed her beta blocker and nitrates at the last appointment. 2. Dyspnea: While her ankle swelling may be related to treatment with amlodipine, I think we  should reevaluate LV systolic function with echo 3. HTN: Control is still probably a little excessive. Reduce amlodipine dose, since this medication is probably responsible for ankle swelling. 4. DM: followed by an endocrinologist at [facility]. Control isn't perfect, but better than in years past 5. HLP: On statin with most lipid parameters well within target range, triglycerides still slightly high, related to glycemic control 6. PAD: There was no evidence of significant carotid or subclavian stenosis on duplex ultrasonography performed in 2016 there was antegrade flow in the left vertebral artery. Her blood pressure should always be checked on the right side (roughly 15 mmHg gradient compared to left).    Medication Adjustments/Labs and Tests Ordered: Current medicines are reviewed at length with the patient today.  Concerns regarding medicines are outlined above.  Medication changes, Labs and Tests ordered today are listed in the Patient Instructions below. Patient Instructions  Dr Sallyanne Kuster has recommended making the following medication changes: 1. DECREASE Amlodipine to 2.5 mg daily  Your physician has requested that you have an echocardiogram to be completed in our [place] location. Echocardiography is a painless test that uses sound waves to create images of your heart. It provides your doctor with information about the size and shape of your heart and how well your heart's chambers and valves are working. This procedure takes approximately one hour. There are no restrictions for this procedure.   Dr Sallyanne Kuster recommends that you schedule a follow-up appointment in 12 months. You will receive a reminder letter in the mail two months in advance. If you don't receive a letter, please call our office to schedule the follow-up appointment.  If you need a refill on your cardiac medications before your next appointment, please call your pharmacy.   Dr C recommends that you drink an extra bottle  of water daily!    Signed, Sanda Klein, MD  03/20/2017 1:25 PM    [facility] [place], [place], [place]  [address] Phone: [phone]; Fax: [phone]

## 2017-03-20 NOTE — Patient Instructions (Addendum)
Dr Sallyanne Kuster has recommended making the following medication changes: 1. DECREASE Amlodipine to 2.5 mg daily  Your physician has requested that you have an echocardiogram to be completed in our [place] location. Echocardiography is a painless test that uses sound waves to create images of your heart. It provides your doctor with information about the size and shape of your heart and how well your heart's chambers and valves are working. This procedure takes approximately one hour. There are no restrictions for this procedure.   Dr Sallyanne Kuster recommends that you schedule a follow-up appointment in 12 months. You will receive a reminder letter in the mail two months in advance. If you don't receive a letter, please call our office to schedule the follow-up appointment.  If you need a refill on your cardiac medications before your next appointment, please call your pharmacy.   Dr C recommends that you drink an extra bottle of water daily!

## 2017-03-26 ENCOUNTER — Ambulatory Visit: Payer: Medicare Other | Admitting: Cardiology

## 2017-03-26 DIAGNOSIS — F5105 Insomnia due to other mental disorder: Secondary | ICD-10-CM | POA: Diagnosis not present

## 2017-03-26 DIAGNOSIS — F419 Anxiety disorder, unspecified: Secondary | ICD-10-CM | POA: Diagnosis not present

## 2017-03-26 DIAGNOSIS — F330 Major depressive disorder, recurrent, mild: Secondary | ICD-10-CM | POA: Diagnosis not present

## 2017-03-30 ENCOUNTER — Ambulatory Visit (HOSPITAL_COMMUNITY): Payer: Medicare Other | Attending: Cardiology

## 2017-03-30 ENCOUNTER — Other Ambulatory Visit: Payer: Self-pay

## 2017-03-30 DIAGNOSIS — I340 Nonrheumatic mitral (valve) insufficiency: Secondary | ICD-10-CM | POA: Insufficient documentation

## 2017-03-30 DIAGNOSIS — R0602 Shortness of breath: Secondary | ICD-10-CM

## 2017-03-30 DIAGNOSIS — I5189 Other ill-defined heart diseases: Secondary | ICD-10-CM | POA: Diagnosis not present

## 2017-04-03 DIAGNOSIS — E538 Deficiency of other specified B group vitamins: Secondary | ICD-10-CM | POA: Diagnosis not present

## 2017-04-03 DIAGNOSIS — Z794 Long term (current) use of insulin: Secondary | ICD-10-CM | POA: Diagnosis not present

## 2017-04-03 DIAGNOSIS — E1165 Type 2 diabetes mellitus with hyperglycemia: Secondary | ICD-10-CM | POA: Diagnosis not present

## 2017-04-04 ENCOUNTER — Telehealth: Payer: Self-pay

## 2017-04-04 NOTE — Telephone Encounter (Signed)
lmtcb

## 2017-04-04 NOTE — Telephone Encounter (Signed)
-----   Message from Sanda Klein, MD sent at 04/01/2017  3:25 PM EDT ----- Good news on echo. Normal heart contraction function and normal valves. Mild relaxation impairment. Is ankle swelling better?

## 2017-04-05 NOTE — Telephone Encounter (Signed)
Follow up    Returning a call to the nurse to get echo results 

## 2017-04-05 NOTE — Telephone Encounter (Signed)
Pt notified she states that she is controlling the ankle swelling and it is doing much better. Pt states that she will call and let us know if ankle swelling is concerning

## 2017-04-10 DIAGNOSIS — E538 Deficiency of other specified B group vitamins: Secondary | ICD-10-CM | POA: Diagnosis not present

## 2017-04-10 DIAGNOSIS — I519 Heart disease, unspecified: Secondary | ICD-10-CM | POA: Diagnosis not present

## 2017-04-10 DIAGNOSIS — E1165 Type 2 diabetes mellitus with hyperglycemia: Secondary | ICD-10-CM | POA: Diagnosis not present

## 2017-04-10 DIAGNOSIS — E781 Pure hyperglyceridemia: Secondary | ICD-10-CM | POA: Diagnosis not present

## 2017-04-10 DIAGNOSIS — E785 Hyperlipidemia, unspecified: Secondary | ICD-10-CM | POA: Diagnosis not present

## 2017-04-10 DIAGNOSIS — E1169 Type 2 diabetes mellitus with other specified complication: Secondary | ICD-10-CM | POA: Diagnosis not present

## 2017-04-10 DIAGNOSIS — Z794 Long term (current) use of insulin: Secondary | ICD-10-CM | POA: Diagnosis not present

## 2017-04-17 ENCOUNTER — Encounter: Payer: Self-pay | Admitting: Family Medicine

## 2017-04-17 ENCOUNTER — Ambulatory Visit (INDEPENDENT_AMBULATORY_CARE_PROVIDER_SITE_OTHER): Payer: Medicare Other | Admitting: Family Medicine

## 2017-04-17 VITALS — BP 116/70 | HR 57 | Temp 98.3°F | Wt 185.0 lb

## 2017-04-17 DIAGNOSIS — I2510 Atherosclerotic heart disease of native coronary artery without angina pectoris: Secondary | ICD-10-CM

## 2017-04-17 DIAGNOSIS — J069 Acute upper respiratory infection, unspecified: Secondary | ICD-10-CM

## 2017-04-17 DIAGNOSIS — I2583 Coronary atherosclerosis due to lipid rich plaque: Secondary | ICD-10-CM

## 2017-04-17 MED ORDER — HYDROCOD POLST-CPM POLST ER 10-8 MG/5ML PO SUER: 5.0000 mL | Freq: Two times a day (BID) | ORAL | 0 refills | Status: DC | PRN

## 2017-04-17 MED ORDER — BENZONATATE 100 MG PO CAPS: 200.0000 mg | ORAL_CAPSULE | Freq: Three times a day (TID) | ORAL | 0 refills | Status: DC | PRN

## 2017-04-17 MED ORDER — DOXYCYCLINE HYCLATE 100 MG PO TABS: 100.0000 mg | ORAL_TABLET | Freq: Two times a day (BID) | ORAL | 0 refills | Status: DC

## 2017-04-17 NOTE — Progress Notes (Signed)
   BP 116/70   Pulse (!) 57   Temp 98.3 F (36.8 C)   Wt 185 lb (83.9 kg)   SpO2 96%   BMI 31.76 kg/m    Subjective:    Patient ID: Laura Rojas, female    DOB: 14-Dec-1945, 71 y.o.   MRN: [id number]  HPI: Laura Rojas is a 71 y.o. female  Chief Complaint  Patient presents with  . Cough    x 5 days, sore throat, productive cough, sneezing.  No chest/head congestion. No sneezing. No ear ache. No fever.   Patient with 5 days of sore throat, productive cough, SOB when laying flat. Denies any nasal congestion, ear pain, HA, fevers. Took 5 days of amoxicillin that she had on hand but did not have any relief with that. Has not tired anything else OTC. No sick contacts noted.   Relevant past medical, surgical, family and social history reviewed and updated as indicated. Interim medical history since our last visit reviewed. Allergies and medications reviewed and updated.  Review of Systems  Constitutional: Negative.   HENT: Positive for sore throat.   Eyes: Negative.   Respiratory: Positive for cough and shortness of breath.   Gastrointestinal: Negative.   Genitourinary: Negative.   Musculoskeletal: Negative.   Neurological: Negative.   Psychiatric/Behavioral: Negative.     Per HPI unless specifically indicated above     Objective:    BP 116/70   Pulse (!) 57   Temp 98.3 F (36.8 C)   Wt 185 lb (83.9 kg)   SpO2 96%   BMI 31.76 kg/m   Wt Readings from Last 3 Encounters:  04/17/17 185 lb (83.9 kg)  03/20/17 184 lb (83.5 kg)  01/23/17 183 lb (83 kg)    Physical Exam  Constitutional: She is oriented to person, place, and time. She appears well-developed and well-nourished. No distress.  HENT:  Head: Atraumatic.  Right Ear: External ear normal.  Left Ear: External ear normal.  Nose: Nose normal.  Mouth/Throat: No oropharyngeal exudate.  Oropharynx erythematous  Eyes: Conjunctivae are normal. Pupils are equal, round, and reactive to light.  Neck: Normal range of  motion. Neck supple.  Cardiovascular: Normal rate and normal heart sounds.   Pulmonary/Chest: Effort normal. No respiratory distress. She has wheezes (minimal wheezes, clear with coughing).  Musculoskeletal: Normal range of motion.  Neurological: She is alert and oriented to person, place, and time.  Skin: Skin is warm and dry.  Psychiatric: She has a normal mood and affect. Her behavior is normal.  Nursing note and vitals reviewed.     Assessment & Plan:   Problem List Items Addressed This Visit    None    Visit Diagnoses    Upper respiratory tract infection, unspecified type    -  Primary   Will treat with doxycycline, tessalon, and tussionex. Discussed humidifier, mucinex, good hydration. F/u if worsening or no improvement       Follow up plan: Return for as scheduled.

## 2017-04-18 ENCOUNTER — Ambulatory Visit: Payer: Medicare Other | Admitting: Cardiovascular Disease

## 2017-05-08 ENCOUNTER — Other Ambulatory Visit: Payer: Self-pay | Admitting: Family Medicine

## 2017-05-08 DIAGNOSIS — Z1231 Encounter for screening mammogram for malignant neoplasm of breast: Secondary | ICD-10-CM

## 2017-05-21 DIAGNOSIS — L821 Other seborrheic keratosis: Secondary | ICD-10-CM | POA: Diagnosis not present

## 2017-05-21 DIAGNOSIS — L820 Inflamed seborrheic keratosis: Secondary | ICD-10-CM | POA: Diagnosis not present

## 2017-05-21 DIAGNOSIS — E119 Type 2 diabetes mellitus without complications: Secondary | ICD-10-CM | POA: Diagnosis not present

## 2017-05-21 DIAGNOSIS — L578 Other skin changes due to chronic exposure to nonionizing radiation: Secondary | ICD-10-CM | POA: Diagnosis not present

## 2017-05-24 ENCOUNTER — Ambulatory Visit (INDEPENDENT_AMBULATORY_CARE_PROVIDER_SITE_OTHER): Payer: Medicare Other | Admitting: Family Medicine

## 2017-05-24 ENCOUNTER — Encounter: Payer: Self-pay | Admitting: Family Medicine

## 2017-05-24 DIAGNOSIS — G4700 Insomnia, unspecified: Secondary | ICD-10-CM

## 2017-05-24 DIAGNOSIS — I2583 Coronary atherosclerosis due to lipid rich plaque: Secondary | ICD-10-CM

## 2017-05-24 DIAGNOSIS — I2510 Atherosclerotic heart disease of native coronary artery without angina pectoris: Secondary | ICD-10-CM

## 2017-05-24 NOTE — Progress Notes (Signed)
BP 125/81   Pulse 66   Wt 181 lb (82.1 kg)   SpO2 97%   BMI 31.07 kg/m    Subjective:    Patient ID: Laura Rojas, female    DOB: 1946-04-23, 71 y.o.   MRN: [id number]  HPI: Laura Rojas is a 71 y.o. female  Chief Complaint  Patient presents with  . Leg Pain    Bilateral. Going on for years.  Starts about 9 p.m.   . Insomnia    Falling and staying asleep. years, worsening. 2-3 nightly  Patient with leg pain no real change taking Tylenol seems to resolve with that. Patient's renal function also stable discuss use of Tylenol and/or Advil and Aleve. Patient with chronic insomnia has tried Remeron 15 mg which at first seemed to help but now doesn't patient can fall asleep about 5:00 and sleep for 4 hours or so. Patient does have a TV on. Discussed with patient's husband no signs or symptoms of sleep apnea.  Relevant past medical, surgical, family and social history reviewed and updated as indicated. Interim medical history since our last visit reviewed. Allergies and medications reviewed and updated.  Review of Systems  Constitutional: Negative.   Respiratory: Negative.   Cardiovascular: Negative.     Per HPI unless specifically indicated above     Objective:    BP 125/81   Pulse 66   Wt 181 lb (82.1 kg)   SpO2 97%   BMI 31.07 kg/m   Wt Readings from Last 3 Encounters:  05/24/17 181 lb (82.1 kg)  04/17/17 185 lb (83.9 kg)  03/20/17 184 lb (83.5 kg)    Physical Exam  Constitutional: She is oriented to person, place, and time. She appears well-developed and well-nourished.  HENT:  Head: Normocephalic and atraumatic.  Eyes: Conjunctivae and EOM are normal.  Neck: Normal range of motion.  Cardiovascular: Normal rate, regular rhythm and normal heart sounds.   Pulmonary/Chest: Effort normal and breath sounds normal.  Musculoskeletal: Normal range of motion.  Neurological: She is alert and oriented to person, place, and time.  Skin: No erythema.  Psychiatric:  She has a normal mood and affect. Her behavior is normal. Judgment and thought content normal.    Results for orders placed or performed in visit on 18/84/16  Basic metabolic panel  Result Value Ref Range   Glucose 239 (H) 65 - 99 mg/dL   BUN 15 8 - 27 mg/dL   Creatinine, Ser 1.19 (H) 0.57 - 1.00 mg/dL   GFR calc non Af Amer 46 (L) >59 mL/min/1.73   GFR calc Af Amer 53 (L) >59 mL/min/1.73   BUN/Creatinine Ratio 13 12 - 28   Sodium 142 134 - 144 mmol/L   Potassium 4.6 3.5 - 5.2 mmol/L   Chloride 103 96 - 106 mmol/L   CO2 22 18 - 29 mmol/L   Calcium 9.4 8.7 - 10.3 mg/dL  LP+ALT+AST Piccolo, Waived  Result Value Ref Range   ALT (SGPT) Piccolo, Waived 23 10 - 47 U/L   AST (SGOT) Piccolo, Waived 24 11 - 38 U/L   Cholesterol Piccolo, Waived 157 <200 mg/dL   HDL Chol Piccolo, Waived 48 (L) >59 mg/dL   Triglycerides Piccolo,Waived 252 (H) <150 mg/dL   Chol/HDL Ratio Piccolo,Waive 3.3 mg/dL   LDL Chol Calc Piccolo Waived 59 <100 mg/dL   VLDL Chol Calc Piccolo,Waive 50 (H) <30 mg/dL  Bayer DCA Hb A1c Waived (STAT)  Result Value Ref Range   Bayer DCA Hb  A1c Waived 8.5 (H) <7.0 %      Assessment & Plan:   Problem List Items Addressed This Visit      Other   Insomnia    Discussed care and treatment of insomnia rules of sleep hygiene. Discussed medication use patient will stop Remeron because of bad dreams. We will try melatonin 10 mg with no lites. Discuss also given in the insomnia and sleep in later.          Follow up plan: Return if symptoms worsen or fail to improve, for As scheduled.

## 2017-05-24 NOTE — Assessment & Plan Note (Signed)
Discussed care and treatment of insomnia rules of sleep hygiene. Discussed medication use patient will stop Remeron because of bad dreams. We will try melatonin 10 mg with no lites. Discuss also given in the insomnia and sleep in later.

## 2017-06-06 ENCOUNTER — Other Ambulatory Visit: Payer: Self-pay | Admitting: Cardiovascular Disease

## 2017-06-06 DIAGNOSIS — I10 Essential (primary) hypertension: Secondary | ICD-10-CM

## 2017-06-14 ENCOUNTER — Ambulatory Visit
Admission: RE | Admit: 2017-06-14 | Discharge: 2017-06-14 | Disposition: A | Discharge status: Home / Self Care | Payer: Medicare Other | Source: Ambulatory Visit | Attending: Family Medicine | Admitting: Family Medicine

## 2017-06-14 ENCOUNTER — Encounter: Payer: Self-pay | Admitting: Family Medicine

## 2017-06-14 DIAGNOSIS — Z1231 Encounter for screening mammogram for malignant neoplasm of breast: Secondary | ICD-10-CM | POA: Diagnosis not present

## 2017-06-26 DIAGNOSIS — F5105 Insomnia due to other mental disorder: Secondary | ICD-10-CM | POA: Diagnosis not present

## 2017-06-26 DIAGNOSIS — F330 Major depressive disorder, recurrent, mild: Secondary | ICD-10-CM | POA: Diagnosis not present

## 2017-06-26 DIAGNOSIS — F419 Anxiety disorder, unspecified: Secondary | ICD-10-CM | POA: Diagnosis not present

## 2017-07-02 ENCOUNTER — Telehealth: Payer: Self-pay | Admitting: Family Medicine

## 2017-07-05 NOTE — Telephone Encounter (Signed)
Called pt to schedule for Annual Wellness Visit with Nurse Health Advisor, Tiffany Hill, my c/b # is [phone]  Kathryn Brown ° °

## 2017-07-10 DIAGNOSIS — E538 Deficiency of other specified B group vitamins: Secondary | ICD-10-CM | POA: Diagnosis not present

## 2017-07-10 DIAGNOSIS — Z72820 Sleep deprivation: Secondary | ICD-10-CM | POA: Diagnosis not present

## 2017-07-10 DIAGNOSIS — E1165 Type 2 diabetes mellitus with hyperglycemia: Secondary | ICD-10-CM | POA: Diagnosis not present

## 2017-07-10 DIAGNOSIS — Z794 Long term (current) use of insulin: Secondary | ICD-10-CM | POA: Diagnosis not present

## 2017-07-10 DIAGNOSIS — F329 Major depressive disorder, single episode, unspecified: Secondary | ICD-10-CM | POA: Diagnosis not present

## 2017-07-10 DIAGNOSIS — E785 Hyperlipidemia, unspecified: Secondary | ICD-10-CM | POA: Diagnosis not present

## 2017-07-10 DIAGNOSIS — E781 Pure hyperglyceridemia: Secondary | ICD-10-CM | POA: Diagnosis not present

## 2017-07-10 DIAGNOSIS — E1169 Type 2 diabetes mellitus with other specified complication: Secondary | ICD-10-CM | POA: Diagnosis not present

## 2017-07-10 NOTE — Telephone Encounter (Signed)
Pt returned call and confirmed appt for 8/22 at 8;45am - called on 8/14 at 9:10am to confirm left vm

## 2017-07-12 DIAGNOSIS — E119 Type 2 diabetes mellitus without complications: Secondary | ICD-10-CM | POA: Diagnosis not present

## 2017-07-12 LAB — HM DIABETES EYE EXAM

## 2017-07-18 ENCOUNTER — Ambulatory Visit (INDEPENDENT_AMBULATORY_CARE_PROVIDER_SITE_OTHER): Payer: Medicare Other

## 2017-07-18 VITALS — BP 142/72 | HR 71 | Temp 98.3°F | Resp 16 | Ht 64.0 in | Wt 188.1 lb

## 2017-07-18 DIAGNOSIS — Z0000 Encounter for general adult medical examination without abnormal findings: Secondary | ICD-10-CM | POA: Diagnosis not present

## 2017-07-18 NOTE — Progress Notes (Signed)
Subjective:   Laura Rojas is a 71 y.o. female who presents for Medicare Annual (Subsequent) preventive examination.  Review of Systems:  Cardiac Risk Factors include: advanced age (>53men, >86 women);obesity (BMI >30kg/m2);diabetes mellitus;dyslipidemia;hypertension     Objective:     Vitals: BP (!) 142/72 (BP Location: Right Arm)   Pulse 71   Temp 98.3 F (36.8 C)   Resp 16   Ht 5\' 4"  (1.626 m)   Wt 188 lb 1.6 oz (85.3 kg)   BMI 32.29 kg/m   Body mass index is 32.29 kg/m.   Tobacco History  Smoking Status  . Never Smoker  Smokeless Tobacco  . Never Used     Counseling given: Not Answered   Past Medical History:  Diagnosis Date  . CAD (coronary artery disease)   . CAD (coronary artery disease) 12/31/2013   2007, 3.0 x 28 mm drug-eluting Cypher stent to RCA, 3.5 x 33 mm drug-eluting Cypher stent and proximal LAD) Consent by 2009 Normal perfusion by mildly 2013   . Dyslipidemia   . Subclavian artery stenosis, left ([place]) 01/01/2015   Past Surgical History:  Procedure Laterality Date  . BREAST BIOPSY Right 1970   EXCISIONAL - NEG  . BREAST BIOPSY Left 1973   EXCISIONAL - NEG  . CARDIAC CATHETERIZATION  08/25/2008   patent stents  . CAROTID DUPLEX  10/2006   NORMAL CAROTID ARTERY DUPLEX  . CORONARY ANGIOPLASTY WITH STENT PLACEMENT  09/17/2006   PTCA & stenting LAD  . ESOPHAGOGASTRODUODENOSCOPY N/A 04/27/2015   Procedure: ESOPHAGOGASTRODUODENOSCOPY (EGD);  Surgeon: Lucilla Lame, MD;  Location: [facility] ENDOSCOPY;  Service: Endoscopy;  Laterality: N/A;  . NM MYOCAR MULTIPLE W/SPECT  11/2011   EF 72%.NORMAL MYOCARDIAL PERFUSION STUDY  . REPLACEMENT TOTAL KNEE  2003 & 2004  . TRANSTHORACIC ECHOCARDIOGRAM  09/2006   EF =>55%. IMPAIRED LV RELAXATION. MILD MITRAL ANNULAR CALCIFICATION. AV-MILDLY SCLEROTIC. NO AS.  Marland Kitchen VAGINAL HYSTERECTOMY  1983   Family History  Problem Relation Age of Onset  . Breast cancer Mother 87  . Diabetes Sister   . Heart disease Maternal  Grandmother   . Heart disease Maternal Grandfather   . Leukemia Sister   . Brain cancer Grandchild    History  Sexual Activity  . Sexual activity: Not on file    Outpatient Encounter Prescriptions as of 07/18/2017  Medication Sig  . amLODipine (NORVASC) 2.5 MG tablet Take 1 tablet (2.5 mg total) by mouth daily.  Marland Kitchen aspirin 81 MG tablet Take 81 mg by mouth daily.  . carvedilol (COREG) 6.25 MG tablet TAKE 1 TABLET (6.25 MG TOTAL) BY MOUTH 2 (TWO) TIMES DAILY.  Marland Kitchen clopidogrel (PLAVIX) 75 MG tablet Take 1 tablet (75 mg total) by mouth daily.  . hydrochlorothiazide (MICROZIDE) 12.5 MG capsule Take 1 capsule (12.5 mg total) by mouth daily.  . insulin aspart (NOVOLOG) 100 UNIT/ML injection Inject 6 Units into the skin 3 (three) times daily before meals.  Marland Kitchen losartan (COZAAR) 100 MG tablet TAKE 1 TABLET (100 MG TOTAL) BY MOUTH DAILY.  . Magnesium 400 MG TABS Take 1 tablet by mouth daily.  . metFORMIN (GLUCOPHAGE) 1000 MG tablet Take 500 mg by mouth 2 (two) times daily.   . Omega-3 Fatty Acids (FISH OIL) 1200 MG CAPS Take 1,200 mg by mouth daily.  . pantoprazole (PROTONIX) 40 MG tablet Take 1 tablet (40 mg total) by mouth daily.  . sertraline (ZOLOFT) 50 MG tablet Take 25 mg by mouth daily.  . simvastatin (ZOCOR) 40 MG  tablet Take 1 tablet (40 mg total) by mouth daily at 6 PM.  . amoxicillin (AMOXIL) 500 MG capsule TAKE 4 CAPSULES BY MOUTH 1 HOUR PRIOR TO DENTAL APPOINTMENT  . mirtazapine (REMERON) 15 MG tablet Take 3.5 mg by mouth daily.    No facility-administered encounter medications on file as of 07/18/2017.     Activities of Daily Living In your present state of health, do you have any difficulty performing the following activities: 07/18/2017  Hearing? N  Vision? N  Difficulty concentrating or making decisions? N  Walking or climbing stairs? N  Dressing or bathing? N  Doing errands, shopping? N  Preparing Food and eating ? N  Using the Toilet? N  In the past six months, have you  accidently leaked urine? N  Do you have problems with loss of bowel control? N  Managing your Medications? N  Managing your Finances? N  Housekeeping or managing your Housekeeping? N  Some recent data might be hidden    Patient Care Team: Guadalupe Maple, MD as PCP - General (Family Medicine) Elease Etienne, MD (Internal Medicine) Chauncey Mann, MD as Referring Physician (Psychiatry)    Assessment:     Exercise Activities and Dietary recommendations Current Exercise Habits: The patient does not participate in regular exercise at present  Goals    . Increase water intake          Recommend drinking at least 3-4 glasses of water a day       Fall Risk Fall Risk  07/18/2017 05/24/2017 01/23/2017 07/05/2016  Falls in the past year? No No No No   Depression Screen PHQ 2/9 Scores 07/18/2017 05/24/2017 09/11/2016 07/05/2016  PHQ - 2 Score 2 1 4  0  PHQ- 9 Score 11 - 19 -     Cognitive Function     6CIT Screen 07/18/2017  What Year? 0 points  What month? 0 points  What time? 0 points  Count back from 20 0 points  Months in reverse 0 points  Repeat phrase 0 points  Total Score 0    Immunization History  Administered Date(s) Administered  . Influenza, High Dose Seasonal PF 09/04/2016  . Influenza,inj,Quad PF,6+ Mos 09/27/2015  . Influenza-Unspecified 09/18/2014  . Pneumococcal Conjugate-13 04/22/2014  . Pneumococcal Polysaccharide-23 11/12/2008, 07/05/2016  . Td 12/21/2009   Screening Tests Health Maintenance  Topic Date Due  . INFLUENZA VACCINE  06/27/2017  . FOOT EXAM  07/05/2017  . HEMOGLOBIN A1C  07/23/2017  . OPHTHALMOLOGY EXAM  07/12/2018  . COLONOSCOPY  12/17/2018  . MAMMOGRAM  06/15/2019  . TETANUS/TDAP  12/22/2019  . DEXA SCAN  Completed  . Hepatitis C Screening  Completed  . PNA vac Low Risk Adult  Completed      Plan:     I have personally reviewed and addressed the Medicare Annual Wellness questionnaire and have noted the following in the patient's  chart:  A. Medical and social history B. Use of alcohol, tobacco or illicit drugs  C. Current medications and supplements D. Functional ability and status E.  Nutritional status F.  Physical activity G. Advance directives H. List of other physicians I.  Hospitalizations, surgeries, and ER visits in previous 12 months J.  [place] such as hearing and vision if needed, cognitive and depression L. Referrals and appointments   In addition, I have reviewed and discussed with patient certain preventive protocols, quality metrics, and best practice recommendations. A written personalized care plan for preventive services as  well as general preventive health recommendations were provided to patient.   Signed,  Tyler Aas, LPN Nurse Health Advisor   MD Recommendations:none

## 2017-07-18 NOTE — Patient Instructions (Addendum)
Laura Rojas , Thank you for taking time to come for your Medicare Wellness Visit. I appreciate your ongoing commitment to your health goals. Please review the following plan we discussed and let me know if I can assist you in the future.   Screening recommendations/referrals: Colonoscopy: completed 12/23/2008,due 12/22/2018 Mammogram: completed 06/14/2017, due 05/2018 Bone Density: completed 05/13/2014 Recommended yearly ophthalmology/optometry visit for glaucoma screening and checkup Recommended yearly dental visit for hygiene and checkup  Vaccinations: Influenza vaccine: up to date, due 07/2017 Pneumococcal vaccine: up to date Tdap vaccine: up to date Shingles vaccine: due, check with your insurance company for coverage  Advanced directives: Advance directive discussed with you today. You have declined information today  Conditions/risks identified: Recommend drinking at least 3-4 glasses of water a day   Next appointment: Follow up on 08/23/2017 at 8:00am with Dr. Jeananne Rama. Follow up in one year for your annual wellness exam.    Preventive Care 65 Years and Older, Female Preventive care refers to lifestyle choices and visits with your health care provider that can promote health and wellness. What does preventive care include?  A yearly physical exam. This is also called an annual well check.  Dental exams once or twice a year.  Routine eye exams. Ask your health care provider how often you should have your eyes checked.  Personal lifestyle choices, including:  Daily care of your teeth and gums.  Regular physical activity.  Eating a healthy diet.  Avoiding tobacco and drug use.  Limiting alcohol use.  Practicing safe sex.  Taking low-dose aspirin every day.  Taking vitamin and mineral supplements as recommended by your health care provider. What happens during an annual well check? The services and screenings done by your health care provider during your annual well check  will depend on your age, overall health, lifestyle risk factors, and family history of disease. Counseling  Your health care provider may ask you questions about your:  Alcohol use.  Tobacco use.  Drug use.  Emotional well-being.  Home and relationship well-being.  Sexual activity.  Eating habits.  History of falls.  Memory and ability to understand (cognition).  Work and work Statistician.  Reproductive health. Screening  You may have the following tests or measurements:  Height, weight, and BMI.  Blood pressure.  Lipid and cholesterol levels. These may be checked every 5 years, or more frequently if you are over 73 years old.  Skin check.  Lung cancer screening. You may have this screening every year starting at age 67 if you have a 30-pack-year history of smoking and currently smoke or have quit within the past 15 years.  Fecal occult blood test (FOBT) of the stool. You may have this test every year starting at age 31.  Flexible sigmoidoscopy or colonoscopy. You may have a sigmoidoscopy every 5 years or a colonoscopy every 10 years starting at age 53.  Hepatitis C blood test.  Hepatitis B blood test.  Sexually transmitted disease (STD) testing.  Diabetes screening. This is done by checking your blood sugar (glucose) after you have not eaten for a while (fasting). You may have this done every 1-3 years.  Bone density scan. This is done to screen for osteoporosis. You may have this done starting at age 72.  Mammogram. This may be done every 1-2 years. Talk to your health care provider about how often you should have regular mammograms. Talk with your health care provider about your test results, treatment options, and if necessary, the need  for more tests. Vaccines  Your health care provider may recommend certain vaccines, such as:  Influenza vaccine. This is recommended every year.  Tetanus, diphtheria, and acellular pertussis (Tdap, Td) vaccine. You may  need a Td booster every 10 years.  Zoster vaccine. You may need this after age 74.  Pneumococcal 13-valent conjugate (PCV13) vaccine. One dose is recommended after age 75.  Pneumococcal polysaccharide (PPSV23) vaccine. One dose is recommended after age 35. Talk to your health care provider about which screenings and vaccines you need and how often you need them. This information is not intended to replace advice given to you by your health care provider. Make sure you discuss any questions you have with your health care provider. Document Released: 12/10/2015 Document Revised: 08/02/2016 Document Reviewed: 09/14/2015 Elsevier Interactive Patient Education  2017 [place] Prevention in the Home Falls can cause injuries. They can happen to people of all ages. There are many things you can do to make your home safe and to help prevent falls. What can I do on the outside of my home?  Regularly fix the edges of walkways and driveways and fix any cracks.  Remove anything that might make you trip as you walk through a door, such as a raised step or threshold.  Trim any bushes or trees on the path to your home.  Use bright outdoor lighting.  Clear any walking paths of anything that might make someone trip, such as rocks or tools.  Regularly check to see if handrails are loose or broken. Make sure that both sides of any steps have handrails.  Any raised decks and porches should have guardrails on the edges.  Have any leaves, snow, or ice cleared regularly.  Use sand or salt on walking paths during winter.  Clean up any spills in your garage right away. This includes oil or grease spills. What can I do in the bathroom?  Use night lights.  Install grab bars by the toilet and in the tub and shower. Do not use towel bars as grab bars.  Use non-skid mats or decals in the tub or shower.  If you need to sit down in the shower, use a plastic, non-slip stool.  Keep the floor  dry. Clean up any water that spills on the floor as soon as it happens.  Remove soap buildup in the tub or shower regularly.  Attach bath mats securely with double-sided non-slip rug tape.  Do not have throw rugs and other things on the floor that can make you trip. What can I do in the bedroom?  Use night lights.  Make sure that you have a light by your bed that is easy to reach.  Do not use any sheets or blankets that are too big for your bed. They should not hang down onto the floor.  Have a firm chair that has side arms. You can use this for support while you get dressed.  Do not have throw rugs and other things on the floor that can make you trip. What can I do in the kitchen?  Clean up any spills right away.  Avoid walking on wet floors.  Keep items that you use a lot in easy-to-reach places.  If you need to reach something above you, use a strong step stool that has a grab bar.  Keep electrical cords out of the way.  Do not use floor polish or wax that makes floors slippery. If you must use wax, use  non-skid floor wax.  Do not have throw rugs and other things on the floor that can make you trip. What can I do with my stairs?  Do not leave any items on the stairs.  Make sure that there are handrails on both sides of the stairs and use them. Fix handrails that are broken or loose. Make sure that handrails are as long as the stairways.  Check any carpeting to make sure that it is firmly attached to the stairs. Fix any carpet that is loose or worn.  Avoid having throw rugs at the top or bottom of the stairs. If you do have throw rugs, attach them to the floor with carpet tape.  Make sure that you have a light switch at the top of the stairs and the bottom of the stairs. If you do not have them, ask someone to add them for you. What else can I do to help prevent falls?  Wear shoes that:  Do not have high heels.  Have rubber bottoms.  Are comfortable and fit you  well.  Are closed at the toe. Do not wear sandals.  If you use a stepladder:  Make sure that it is fully opened. Do not climb a closed stepladder.  Make sure that both sides of the stepladder are locked into place.  Ask someone to hold it for you, if possible.  Clearly mark and make sure that you can see:  Any grab bars or handrails.  First and last steps.  Where the edge of each step is.  Use tools that help you move around (mobility aids) if they are needed. These include:  Canes.  Walkers.  Scooters.  Crutches.  Turn on the lights when you go into a dark area. Replace any light bulbs as soon as they burn out.  Set up your furniture so you have a clear path. Avoid moving your furniture around.  If any of your floors are uneven, fix them.  If there are any pets around you, be aware of where they are.  Review your medicines with your doctor. Some medicines can make you feel dizzy. This can increase your chance of falling. Ask your doctor what other things that you can do to help prevent falls. This information is not intended to replace advice given to you by your health care provider. Make sure you discuss any questions you have with your health care provider. Document Released: 09/09/2009 Document Revised: 04/20/2016 Document Reviewed: 12/18/2014 Elsevier Interactive Patient Education  2017 [employer].

## 2017-07-19 ENCOUNTER — Telehealth: Payer: Self-pay | Admitting: Cardiovascular Disease

## 2017-07-19 NOTE — Telephone Encounter (Signed)
Per pt call   Please give her a call back she has questions about medications she might need.  Pt would like Dr Sallyanne Kuster RN to give her a call back.

## 2017-07-19 NOTE — Telephone Encounter (Signed)
Sorry, Lattie Haw. I feel bad for her predicament, but the cardiology office is not the appropriate place for this prescription to come from. She should discuss this again with her PCP or find a psych specialist.

## 2017-07-19 NOTE — Telephone Encounter (Signed)
Returned the call to the patient. She stated that she has only been getting an hour of sleep each night for the past year and half. She lost her grandson to a brain tumor at that time and she is now very anxious.  She was seeing a therapist who tried five different medications, none of which helped the patient sleep. She stated that they gave her bad dreams and weight gain.   She stated that her friend's cardiologist prescribed her friend Xanax which helped her friend. She took some of her friend's Xanax and "she finally had a full night's sleep." She has been advised to not take her friend's medication which has been prescribed specifically for her.  She would like to know if Dr. Sallyanne Kuster will prescribe Xanax for her. She was advised to call her PCP and therapist about the Xanax. She stated that her therapist would not prescribe it and her PCP would not do it if she was no longer seeing her therapist. Will route to Dr. Sallyanne Kuster for his knowledge and recommendation.

## 2017-07-19 NOTE — Telephone Encounter (Signed)
Left a message to return the call, per DPR.

## 2017-07-19 NOTE — Telephone Encounter (Signed)
The patient has been made aware of Dr.Croitoru's advice and verbalized her understanding.

## 2017-07-23 DIAGNOSIS — Z72821 Inadequate sleep hygiene: Secondary | ICD-10-CM | POA: Diagnosis not present

## 2017-07-23 DIAGNOSIS — G4700 Insomnia, unspecified: Secondary | ICD-10-CM | POA: Diagnosis not present

## 2017-07-23 DIAGNOSIS — F0630 Mood disorder due to known physiological condition, unspecified: Secondary | ICD-10-CM | POA: Diagnosis not present

## 2017-07-26 ENCOUNTER — Institutional Professional Consult (permissible substitution): Payer: Medicare Other | Admitting: Internal Medicine

## 2017-08-14 DIAGNOSIS — Z72820 Sleep deprivation: Secondary | ICD-10-CM | POA: Diagnosis not present

## 2017-08-14 DIAGNOSIS — E1165 Type 2 diabetes mellitus with hyperglycemia: Secondary | ICD-10-CM | POA: Diagnosis not present

## 2017-08-14 DIAGNOSIS — Z794 Long term (current) use of insulin: Secondary | ICD-10-CM | POA: Diagnosis not present

## 2017-08-14 DIAGNOSIS — E781 Pure hyperglyceridemia: Secondary | ICD-10-CM | POA: Diagnosis not present

## 2017-08-14 DIAGNOSIS — E785 Hyperlipidemia, unspecified: Secondary | ICD-10-CM | POA: Diagnosis not present

## 2017-08-14 DIAGNOSIS — E538 Deficiency of other specified B group vitamins: Secondary | ICD-10-CM | POA: Diagnosis not present

## 2017-08-16 ENCOUNTER — Encounter: Payer: Self-pay | Admitting: Family Medicine

## 2017-08-16 ENCOUNTER — Ambulatory Visit (INDEPENDENT_AMBULATORY_CARE_PROVIDER_SITE_OTHER): Payer: Medicare Other | Admitting: Family Medicine

## 2017-08-16 VITALS — BP 125/79 | HR 65 | Temp 98.4°F | Wt 186.0 lb

## 2017-08-16 DIAGNOSIS — R21 Rash and other nonspecific skin eruption: Secondary | ICD-10-CM | POA: Diagnosis not present

## 2017-08-16 DIAGNOSIS — I2583 Coronary atherosclerosis due to lipid rich plaque: Secondary | ICD-10-CM

## 2017-08-16 DIAGNOSIS — I2510 Atherosclerotic heart disease of native coronary artery without angina pectoris: Secondary | ICD-10-CM

## 2017-08-16 MED ORDER — HYDROXYZINE HCL 25 MG PO TABS: 25.0000 mg | ORAL_TABLET | Freq: Three times a day (TID) | ORAL | 0 refills | Status: DC | PRN

## 2017-08-16 NOTE — Progress Notes (Signed)
   BP 125/79   Pulse 65   Temp 98.4 F (36.9 C)   Wt 186 lb (84.4 kg)   SpO2 95%   BMI 31.93 kg/m    Subjective:    Patient ID: Laura Rojas, female    DOB: Aug 28, 1946, 71 y.o.   MRN: [id number]  HPI: Laura Rojas is a 71 y.o. female  Chief Complaint  Patient presents with  . Rash    x 3 weeks, sores, itches, stings, burns, painful.   Patient presents with itchy and painful rash on mid-upper back x 3 weeks. Has been applying anti-itch creams to it with no relief. Scratched it vigorously at first but has been trying not to scratch lately. No recent new exposures, medications, foods. Denies fever, chills, insect bites, SOB.   Relevant past medical, surgical, family and social history reviewed and updated as indicated. Interim medical history since our last visit reviewed. Allergies and medications reviewed and updated.  Review of Systems  Constitutional: Negative.   Respiratory: Negative.   Cardiovascular: Negative.   Gastrointestinal: Negative.   Musculoskeletal: Negative.   Skin: Positive for rash.  Neurological: Negative.   Psychiatric/Behavioral: Negative.     Per HPI unless specifically indicated above     Objective:    BP 125/79   Pulse 65   Temp 98.4 F (36.9 C)   Wt 186 lb (84.4 kg)   SpO2 95%   BMI 31.93 kg/m   Wt Readings from Last 3 Encounters:  08/16/17 186 lb (84.4 kg)  07/18/17 188 lb 1.6 oz (85.3 kg)  05/24/17 181 lb (82.1 kg)    Physical Exam  Constitutional: She is oriented to person, place, and time. She appears well-developed and well-nourished.  HENT:  Head: Atraumatic.  Eyes: Pupils are equal, round, and reactive to light. Conjunctivae are normal.  Neck: Normal range of motion. Neck supple.  Cardiovascular: Normal rate and normal heart sounds.   Pulmonary/Chest: Effort normal and breath sounds normal.  Musculoskeletal: Normal range of motion.  Neurological: She is alert and oriented to person, place, and time.  Skin: Skin is warm  and dry. Rash (Appears like an area of healing urticaria with scabbed over scratch lines that run vertically in mid-upper back) noted.  Psychiatric: She has a normal mood and affect. Her behavior is normal.  Nursing note and vitals reviewed.   Results for orders placed or performed in visit on 07/13/17  HM DIABETES EYE EXAM  Result Value Ref Range   HM Diabetic Eye Exam No Retinopathy No Retinopathy      Assessment & Plan:   Problem List Items Addressed This Visit    None    Visit Diagnoses    Rash    -  Primary   Discussed not scratching, neosporin on open areas from scratching, and taking benadryl once-twice daily as tolerated. Good moisturizer BID.        Follow up plan: Return if symptoms worsen or fail to improve.

## 2017-08-19 NOTE — Patient Instructions (Signed)
Follow up if no improvement 

## 2017-08-23 ENCOUNTER — Ambulatory Visit (INDEPENDENT_AMBULATORY_CARE_PROVIDER_SITE_OTHER): Payer: Medicare Other | Admitting: Family Medicine

## 2017-08-23 ENCOUNTER — Encounter: Payer: Self-pay | Admitting: Family Medicine

## 2017-08-23 VITALS — BP 124/75 | HR 77 | Temp 98.4°F | Ht 64.0 in | Wt 187.8 lb

## 2017-08-23 DIAGNOSIS — Z7189 Other specified counseling: Secondary | ICD-10-CM | POA: Diagnosis not present

## 2017-08-23 DIAGNOSIS — L738 Other specified follicular disorders: Secondary | ICD-10-CM | POA: Diagnosis not present

## 2017-08-23 DIAGNOSIS — Z23 Encounter for immunization: Secondary | ICD-10-CM

## 2017-08-23 DIAGNOSIS — L309 Dermatitis, unspecified: Secondary | ICD-10-CM

## 2017-08-23 DIAGNOSIS — G4700 Insomnia, unspecified: Secondary | ICD-10-CM | POA: Diagnosis not present

## 2017-08-23 DIAGNOSIS — R053 Chronic cough: Secondary | ICD-10-CM

## 2017-08-23 DIAGNOSIS — N183 Chronic kidney disease, stage 3 (moderate): Secondary | ICD-10-CM

## 2017-08-23 DIAGNOSIS — Z1283 Encounter for screening for malignant neoplasm of skin: Secondary | ICD-10-CM | POA: Diagnosis not present

## 2017-08-23 DIAGNOSIS — L821 Other seborrheic keratosis: Secondary | ICD-10-CM | POA: Diagnosis not present

## 2017-08-23 DIAGNOSIS — D2271 Melanocytic nevi of right lower limb, including hip: Secondary | ICD-10-CM | POA: Diagnosis not present

## 2017-08-23 DIAGNOSIS — R05 Cough: Secondary | ICD-10-CM | POA: Diagnosis not present

## 2017-08-23 DIAGNOSIS — I2583 Coronary atherosclerosis due to lipid rich plaque: Secondary | ICD-10-CM

## 2017-08-23 DIAGNOSIS — I771 Stricture of artery: Secondary | ICD-10-CM

## 2017-08-23 DIAGNOSIS — I2510 Atherosclerotic heart disease of native coronary artery without angina pectoris: Secondary | ICD-10-CM

## 2017-08-23 DIAGNOSIS — I10 Essential (primary) hypertension: Secondary | ICD-10-CM | POA: Diagnosis not present

## 2017-08-23 DIAGNOSIS — E782 Mixed hyperlipidemia: Secondary | ICD-10-CM

## 2017-08-23 DIAGNOSIS — F0630 Mood disorder due to known physiological condition, unspecified: Secondary | ICD-10-CM | POA: Diagnosis not present

## 2017-08-23 DIAGNOSIS — D225 Melanocytic nevi of trunk: Secondary | ICD-10-CM | POA: Diagnosis not present

## 2017-08-23 DIAGNOSIS — E1122 Type 2 diabetes mellitus with diabetic chronic kidney disease: Secondary | ICD-10-CM

## 2017-08-23 DIAGNOSIS — F424 Excoriation (skin-picking) disorder: Secondary | ICD-10-CM | POA: Diagnosis not present

## 2017-08-23 DIAGNOSIS — L814 Other melanin hyperpigmentation: Secondary | ICD-10-CM | POA: Diagnosis not present

## 2017-08-23 DIAGNOSIS — Z72821 Inadequate sleep hygiene: Secondary | ICD-10-CM | POA: Diagnosis not present

## 2017-08-23 DIAGNOSIS — D1801 Hemangioma of skin and subcutaneous tissue: Secondary | ICD-10-CM | POA: Diagnosis not present

## 2017-08-23 LAB — URINALYSIS, ROUTINE W REFLEX MICROSCOPIC
BILIRUBIN UA: NEGATIVE
GLUCOSE, UA: NEGATIVE
Ketones, UA: NEGATIVE
NITRITE UA: NEGATIVE
PH UA: 6.5 (ref 5.0–7.5)
PROTEIN UA: NEGATIVE
RBC UA: NEGATIVE
Specific Gravity, UA: 1.02 (ref 1.005–1.030)
UUROB: 0.2 mg/dL (ref 0.2–1.0)

## 2017-08-23 LAB — MICROSCOPIC EXAMINATION: BACTERIA UA: NONE SEEN

## 2017-08-23 MED ORDER — CLOPIDOGREL BISULFATE 75 MG PO TABS: 75.0000 mg | ORAL_TABLET | Freq: Every day | ORAL | 4 refills | Status: DC

## 2017-08-23 MED ORDER — SIMVASTATIN 40 MG PO TABS: 40.0000 mg | ORAL_TABLET | Freq: Every day | ORAL | 4 refills | Status: DC

## 2017-08-23 MED ORDER — HYDROCHLOROTHIAZIDE 12.5 MG PO CAPS: 12.5000 mg | ORAL_CAPSULE | Freq: Every day | ORAL | 4 refills | Status: DC

## 2017-08-23 MED ORDER — BETAMETHASONE DIPROPIONATE 0.05 % EX CREA: TOPICAL_CREAM | Freq: Two times a day (BID) | CUTANEOUS | 0 refills | Status: DC

## 2017-08-23 MED ORDER — PANTOPRAZOLE SODIUM 40 MG PO TBEC: 40.0000 mg | DELAYED_RELEASE_TABLET | Freq: Every day | ORAL | 4 refills | Status: DC

## 2017-08-23 MED ORDER — LOSARTAN POTASSIUM 100 MG PO TABS: 100.0000 mg | ORAL_TABLET | Freq: Every day | ORAL | 4 refills | Status: DC

## 2017-08-23 MED ORDER — HYDROXYZINE HCL 25 MG PO TABS: 25.0000 mg | ORAL_TABLET | Freq: Three times a day (TID) | ORAL | 4 refills | Status: DC | PRN

## 2017-08-23 NOTE — Assessment & Plan Note (Signed)
No current symptoms followed by cardiology and stable.

## 2017-08-23 NOTE — Progress Notes (Signed)
BP 124/75   Pulse 77   Temp 98.4 F (36.9 C)   Ht 5\' 4"  (1.626 m)   Wt 187 lb 12.8 oz (85.2 kg)   SpO2 98%   BMI 32.24 kg/m    Subjective:    Patient ID: Laura Rojas, female    DOB: [birth year], 71 y.o.   MRN: [id number]  HPI: Laura Rojas is a 71 y.o. female  Chief Complaint  Patient presents with  . Annual Exam  Patient's back rash not any improved has been using antibiotic ointment and also some cortisone ointment over-the-counter which has not helped. This rash itches a great deal especially at night. Patient also with continued chronic cough multiple workups all negative. Patient's found that mints worked the best. Turns out [employer] and other stuff doesn't seem to do much. Diabetes doing a little better followed by endocrinology. Blood pressure doing well. No cardiac symptoms or problems. Still ongoing insomnia not sleeping really good. Patient has some Xanax given by pulmonology Dr. Darrow Bussing which may be helping a little bit. Takes amoxicillin prophylactically prior to dental visits for knee replacements Hydroxyzine hasn't helped a lot for itching rash on her back does help with sleep. Discussed tachyphylaxis with hydroxyzine and weighs around this problem patient will continue hydroxyzine on a when necessary basis primarily for sleep.  Relevant past medical, surgical, family and social history reviewed and updated as indicated. Interim medical history since our last visit reviewed. Allergies and medications reviewed and updated.  Review of Systems  Constitutional: Negative.   HENT: Negative.   Eyes: Negative.   Respiratory: Negative.   Cardiovascular: Negative.   Gastrointestinal: Negative.   Endocrine: Negative.   Genitourinary: Negative.   Musculoskeletal: Negative.   Skin: Negative.   Allergic/Immunologic: Negative.   Neurological: Negative.   Hematological: Negative.   Psychiatric/Behavioral: Negative.     Per HPI unless specifically indicated  above     Objective:    BP 124/75   Pulse 77   Temp 98.4 F (36.9 C)   Ht 5\' 4"  (1.626 m)   Wt 187 lb 12.8 oz (85.2 kg)   SpO2 98%   BMI 32.24 kg/m   Wt Readings from Last 3 Encounters:  08/23/17 187 lb 12.8 oz (85.2 kg)  08/16/17 186 lb (84.4 kg)  07/18/17 188 lb 1.6 oz (85.3 kg)    Physical Exam  Constitutional: She is oriented to person, place, and time. She appears well-developed and well-nourished.  HENT:  Head: Normocephalic and atraumatic.  Right Ear: External ear normal.  Left Ear: External ear normal.  Nose: Nose normal.  Mouth/Throat: Oropharynx is clear and moist.  Eyes: Pupils are equal, round, and reactive to light. Conjunctivae and EOM are normal.  Neck: Normal range of motion. Neck supple. Carotid bruit is not present.  Cardiovascular: Normal rate, regular rhythm and normal heart sounds.   No murmur heard. Pulmonary/Chest: Effort normal and breath sounds normal. She exhibits no mass. Right breast exhibits no mass, no skin change and no tenderness. Left breast exhibits no mass, no skin change and no tenderness. Breasts are symmetrical.  Abdominal: Soft. Bowel sounds are normal. There is no hepatosplenomegaly.  Musculoskeletal: Normal range of motion.  Neurological: She is alert and oriented to person, place, and time.  Skin: No rash noted.  Psychiatric: She has a normal mood and affect. Her behavior is normal. Judgment and thought content normal.    Results for orders placed or performed in visit on 07/13/17  HM DIABETES  EYE EXAM  Result Value Ref Range   HM Diabetic Eye Exam No Retinopathy No Retinopathy      Assessment & Plan:   Problem List Items Addressed This Visit      Cardiovascular and Mediastinum   CAD (coronary artery disease)    The current medical regimen is effective;  continue present plan and medications.       Relevant Medications   clopidogrel (PLAVIX) 75 MG tablet   hydrochlorothiazide (MICROZIDE) 12.5 MG capsule   losartan  (COZAAR) 100 MG tablet   simvastatin (ZOCOR) 40 MG tablet   Other Relevant Orders   Lipid panel   TSH   CBC with Differential/Platelet   Comprehensive metabolic panel   TSH   Urinalysis, Routine w reflex microscopic   HTN (hypertension)    The current medical regimen is effective;  continue present plan and medications.       Relevant Medications   hydrochlorothiazide (MICROZIDE) 12.5 MG capsule   losartan (COZAAR) 100 MG tablet   simvastatin (ZOCOR) 40 MG tablet   Other Relevant Orders   CBC with Differential/Platelet   Comprehensive metabolic panel   TSH   Urinalysis, Routine w reflex microscopic   Subclavian artery stenosis, left (HCC)    No current symptoms followed by cardiology and stable.      Relevant Medications   hydrochlorothiazide (MICROZIDE) 12.5 MG capsule   losartan (COZAAR) 100 MG tablet   simvastatin (ZOCOR) 40 MG tablet   Other Relevant Orders   CBC with Differential/Platelet   Comprehensive metabolic panel     Endocrine   CKD stage 3 due to type 2 diabetes mellitus ([place])    Followed by endocrinology and doing better.      Relevant Medications   losartan (COZAAR) 100 MG tablet   simvastatin (ZOCOR) 40 MG tablet     Musculoskeletal and Integument   Dermatitis    For nonspecific dermatitis Will try Diprolene cream. Husband will apply small amount twice a day. If not improving will refer to dermatology.        Other   Hyperlipidemia    The current medical regimen is effective;  continue present plan and medications.       Relevant Medications   hydrochlorothiazide (MICROZIDE) 12.5 MG capsule   losartan (COZAAR) 100 MG tablet   simvastatin (ZOCOR) 40 MG tablet   Other Relevant Orders   Lipid panel   Chronic cough    Discussed various cares and treatments have decided that mints worked the best      Insomnia    Turns out to hydroxyzine works to help with her sleep will okay this to continue. Doesn't do anything for her dermatitis on her  back      Relevant Orders   TSH   Advanced care planning/counseling discussion - Primary    A voluntary discussion about advance care planning including the explanation and discussion of advance directives was extensively discussed  with the patient.  Explanation about the health care proxy and Living will was reviewed and packet with forms with explanation of how to fill them out was given.             Follow up plan: Return in about 6 months (around 02/20/2018) for BMP,  Lipids, ALT, AST.

## 2017-08-23 NOTE — Assessment & Plan Note (Signed)
Discussed various cares and treatments have decided that mints worked the best

## 2017-08-23 NOTE — Assessment & Plan Note (Signed)
For nonspecific dermatitis Will try Diprolene cream. Husband will apply small amount twice a day. If not improving will refer to dermatology.

## 2017-08-23 NOTE — Assessment & Plan Note (Signed)
Turns out to hydroxyzine works to help with her sleep will okay this to continue. Doesn't do anything for her dermatitis on her back

## 2017-08-23 NOTE — Assessment & Plan Note (Signed)
A voluntary discussion about advance care planning including the explanation and discussion of advance directives was extensively discussed  with the patient.  Explanation about the health care proxy and Living will was reviewed and packet with forms with explanation of how to fill them out was given.    

## 2017-08-23 NOTE — Assessment & Plan Note (Signed)
The current medical regimen is effective;  continue present plan and medications.  

## 2017-08-23 NOTE — Addendum Note (Signed)
Addended by: Jerene Pitch on: 08/23/2017 08:53 AM   Modules accepted: Orders

## 2017-08-23 NOTE — Assessment & Plan Note (Signed)
Followed by endocrinology and doing better. 

## 2017-08-24 LAB — COMPREHENSIVE METABOLIC PANEL
A/G RATIO: 2 (ref 1.2–2.2)
ALT: 25 IU/L (ref 0–32)
AST: 31 IU/L (ref 0–40)
Albumin: 4.3 g/dL (ref 3.5–4.8)
Alkaline Phosphatase: 133 IU/L — ABNORMAL HIGH (ref 39–117)
BUN / CREAT RATIO: 15 (ref 12–28)
BUN: 18 mg/dL (ref 8–27)
Bilirubin Total: 0.3 mg/dL (ref 0.0–1.2)
CHLORIDE: 101 mmol/L (ref 96–106)
CO2: 23 mmol/L (ref 20–29)
Calcium: 9.6 mg/dL (ref 8.7–10.3)
Creatinine, Ser: 1.23 mg/dL — ABNORMAL HIGH (ref 0.57–1.00)
GFR calc non Af Amer: 44 mL/min/{1.73_m2} — ABNORMAL LOW (ref >59)
GFR, EST AFRICAN AMERICAN: 51 mL/min/{1.73_m2} — AB (ref >59)
Globulin, Total: 2.1 g/dL (ref 1.5–4.5)
Glucose: 223 mg/dL — ABNORMAL HIGH (ref 65–99)
Potassium: 4.4 mmol/L (ref 3.5–5.2)
Sodium: 140 mmol/L (ref 134–144)
Total Protein: 6.4 g/dL (ref 6.0–8.5)

## 2017-08-24 LAB — CBC WITH DIFFERENTIAL/PLATELET
BASOS: 1 %
Basophils Absolute: 0 10*3/uL (ref 0.0–0.2)
EOS (ABSOLUTE): 0.4 10*3/uL (ref 0.0–0.4)
Eos: 9 %
Hematocrit: 30.9 % — ABNORMAL LOW (ref 34.0–46.6)
Hemoglobin: 9.8 g/dL — ABNORMAL LOW (ref 11.1–15.9)
Immature Grans (Abs): 0 10*3/uL (ref 0.0–0.1)
Immature Granulocytes: 0 %
LYMPHS ABS: 1.7 10*3/uL (ref 0.7–3.1)
Lymphs: 33 %
MCH: 25.4 pg — AB (ref 26.6–33.0)
MCHC: 31.7 g/dL (ref 31.5–35.7)
MCV: 80 fL (ref 79–97)
MONOS ABS: 0.5 10*3/uL (ref 0.1–0.9)
Monocytes: 9 %
NEUTROS ABS: 2.4 10*3/uL (ref 1.4–7.0)
NEUTROS PCT: 48 %
PLATELETS: 322 10*3/uL (ref 150–379)
RBC: 3.86 x10E6/uL (ref 3.77–5.28)
RDW: 16.2 % — AB (ref 12.3–15.4)
WBC: 5 10*3/uL (ref 3.4–10.8)

## 2017-08-24 LAB — LIPID PANEL
CHOL/HDL RATIO: 3.5 ratio (ref 0.0–4.4)
Cholesterol, Total: 133 mg/dL (ref 100–199)
HDL: 38 mg/dL — AB (ref >39)
LDL Calculated: 37 mg/dL (ref 0–99)
Triglycerides: 291 mg/dL — ABNORMAL HIGH (ref 0–149)
VLDL CHOLESTEROL CAL: 58 mg/dL — AB (ref 5–40)

## 2017-08-24 LAB — TSH: TSH: 2.81 u[IU]/mL (ref 0.450–4.500)

## 2017-08-26 ENCOUNTER — Other Ambulatory Visit: Payer: Self-pay | Admitting: Family Medicine

## 2017-08-26 DIAGNOSIS — I10 Essential (primary) hypertension: Secondary | ICD-10-CM

## 2017-09-04 ENCOUNTER — Other Ambulatory Visit: Payer: Self-pay | Admitting: Family Medicine

## 2017-09-04 DIAGNOSIS — I10 Essential (primary) hypertension: Secondary | ICD-10-CM

## 2017-09-07 ENCOUNTER — Other Ambulatory Visit: Payer: Self-pay | Admitting: Family Medicine

## 2017-09-07 DIAGNOSIS — I10 Essential (primary) hypertension: Secondary | ICD-10-CM

## 2017-09-29 ENCOUNTER — Other Ambulatory Visit: Payer: Self-pay | Admitting: Family Medicine

## 2017-10-01 NOTE — Telephone Encounter (Signed)
Routing to office; refill for atarax

## 2017-10-10 ENCOUNTER — Other Ambulatory Visit: Payer: Self-pay | Admitting: Family Medicine

## 2017-10-10 DIAGNOSIS — G4700 Insomnia, unspecified: Secondary | ICD-10-CM | POA: Diagnosis not present

## 2017-10-10 DIAGNOSIS — G2581 Restless legs syndrome: Secondary | ICD-10-CM | POA: Diagnosis not present

## 2017-10-10 DIAGNOSIS — G479 Sleep disorder, unspecified: Secondary | ICD-10-CM | POA: Diagnosis not present

## 2017-10-10 DIAGNOSIS — Z72820 Sleep deprivation: Secondary | ICD-10-CM | POA: Diagnosis not present

## 2017-10-10 DIAGNOSIS — F419 Anxiety disorder, unspecified: Secondary | ICD-10-CM | POA: Diagnosis not present

## 2017-10-10 DIAGNOSIS — F329 Major depressive disorder, single episode, unspecified: Secondary | ICD-10-CM | POA: Diagnosis not present

## 2017-10-15 DIAGNOSIS — E785 Hyperlipidemia, unspecified: Secondary | ICD-10-CM | POA: Diagnosis not present

## 2017-10-15 DIAGNOSIS — Z72820 Sleep deprivation: Secondary | ICD-10-CM | POA: Diagnosis not present

## 2017-10-15 DIAGNOSIS — E781 Pure hyperglyceridemia: Secondary | ICD-10-CM | POA: Diagnosis not present

## 2017-10-15 DIAGNOSIS — Z794 Long term (current) use of insulin: Secondary | ICD-10-CM | POA: Diagnosis not present

## 2017-10-15 DIAGNOSIS — E1165 Type 2 diabetes mellitus with hyperglycemia: Secondary | ICD-10-CM | POA: Diagnosis not present

## 2017-10-15 DIAGNOSIS — E538 Deficiency of other specified B group vitamins: Secondary | ICD-10-CM | POA: Diagnosis not present

## 2017-10-15 DIAGNOSIS — E11649 Type 2 diabetes mellitus with hypoglycemia without coma: Secondary | ICD-10-CM | POA: Diagnosis not present

## 2017-10-16 ENCOUNTER — Encounter: Payer: Self-pay | Admitting: Internal Medicine

## 2017-10-16 ENCOUNTER — Ambulatory Visit (INDEPENDENT_AMBULATORY_CARE_PROVIDER_SITE_OTHER): Payer: Medicare Other | Admitting: Internal Medicine

## 2017-10-16 DIAGNOSIS — G2581 Restless legs syndrome: Secondary | ICD-10-CM | POA: Diagnosis not present

## 2017-10-16 DIAGNOSIS — F5101 Primary insomnia: Secondary | ICD-10-CM | POA: Diagnosis not present

## 2017-10-16 DIAGNOSIS — I2583 Coronary atherosclerosis due to lipid rich plaque: Secondary | ICD-10-CM | POA: Diagnosis not present

## 2017-10-16 DIAGNOSIS — I2510 Atherosclerotic heart disease of native coronary artery without angina pectoris: Secondary | ICD-10-CM

## 2017-10-16 MED ORDER — SUVOREXANT 15 MG PO TABS: 1.0000 | ORAL_TABLET | Freq: Every day | ORAL | 0 refills | Status: DC

## 2017-10-16 NOTE — Assessment & Plan Note (Signed)
We are going to wait for now on a sleep study because it does not seem likely that it will contribute.  We had a careful discussion of age, medication side effects, concerns of oversedation, falls/confusion/injury and I made sure that husband was in on this conversation.  The [facility] at [facility] covers the issues and educational points very well, based on their note..  After sustained discussion, we are going to try addressing the insomnia component with belsomra, overlapping with gabapentin for her limb movement sleep disturbance.

## 2017-10-16 NOTE — Patient Instructions (Addendum)
Pay attention and try to benefit from the advice on good sleep habits and comfortable bedroom that the Neurology sleep clinic gave you.  Remember, with our medicines, we don't want you over-medicated, confused, falling down or otherwise made worse. This is a real issue with medicines for sleep as we get older.  We are going to try taking your gabapentin 30 mg at 8:00 pm, around when the legs start.  Then at around 11:00pm try adding the Belsomra. You will want to give this at least a 2 or 3 week trial, if you can.  Script sent for Belsomra 15 mg

## 2017-10-16 NOTE — Progress Notes (Signed)
10/16/17-71 year old female never smoker (long 2cnd hand) for sleep evaluation. Medical problem list includes CAD, subclavian artery stenosis L, HBP, chronic cough, CKD 3, DM 2,       Comes with her husband Referred courtesy of  PCP Dr. Jeananne Rama for chronic insomnia- failed Remeron 15 mg, Seen at [facility] on 11/14 for sleep deprivation. Their note describes ambien helpful but dc'd, Ativan helpful but dc'd. Sees psychiatrist for for anxiety and depression. Detailed Neurology note reviewed. They assessed: anxiety and depression, Insomnia, Restless Legs. Prescribed gabapentin, checked ferritin., reviewed sleep hygiene, discussed CBT and sleep restriction.. Mrs. Harbin says that Neurology visit was "not helpful".  I pointed out that the note was complete and detailed with several issues pertinent to her complaint being addressed. She had used Ativan 1 mg daily times 30 years until her PCP DC'd it 2 years ago.  Since then she has had difficulty initiating and maintaining sleep.  She recognizes any important contribution from her anxiety and depression-getting tearful with me as she described death of her mother and death of her grandson as also noted in the Neurology note. Her husband is aware of her leg movement at night.  She did not start the gabapentin yet, waiting until she could discuss it with me. She denies daytime sleepiness.  Husband says she never snores significantly.  He has sleep apnea and he does not believe that she has it.  When she cannot sleep, she gets up and eats-blaming this for 26 pound weight gain in recent years. Epworth 0  Prior to Admission medications   Medication Sig Start Date End Date Taking? Authorizing Provider  ALPRAZolam (XANAX) 0.25 MG tablet Take 0.25 mg by mouth at bedtime as needed. 08/05/17  Yes [provider]  amLODipine (NORVASC) 2.5 MG tablet Take 1 tablet (2.5 mg total) by mouth daily. 03/20/17  Yes Croitoru, Mihai, MD  amoxicillin (AMOXIL)  500 MG capsule TAKE 4 CAPSULES BY MOUTH 1 HOUR PRIOR TO DENTAL APPOINTMENT 04/13/17  Yes [provider]  aspirin 81 MG tablet Take 81 mg by mouth daily.   Yes [provider]  betamethasone dipropionate (DIPROLENE) 0.05 % cream Apply topically 2 (two) times daily. 08/23/17  Yes Crissman, Jeannette How, MD  carvedilol (COREG) 6.25 MG tablet TAKE 1 TABLET (6.25 MG TOTAL) BY MOUTH 2 (TWO) TIMES DAILY. 06/06/17  Yes Croitoru, Mihai, MD  clopidogrel (PLAVIX) 75 MG tablet Take 1 tablet (75 mg total) by mouth daily. 08/23/17  Yes Crissman, Jeannette How, MD  hydrochlorothiazide (MICROZIDE) 12.5 MG capsule Take 1 capsule (12.5 mg total) by mouth daily. 08/23/17  Yes Crissman, Jeannette How, MD  hydrOXYzine (ATARAX/VISTARIL) 25 MG tablet Take 1 tablet (25 mg total) by mouth 3 (three) times daily as needed. 08/23/17  Yes Crissman, Jeannette How, MD  insulin aspart (NOVOLOG) 100 UNIT/ML injection Inject 6 Units into the skin 3 (three) times daily before meals.   Yes [provider]  losartan (COZAAR) 100 MG tablet Take 1 tablet (100 mg total) by mouth daily. 08/23/17  Yes Crissman, Jeannette How, MD  Magnesium 400 MG TABS Take 1 tablet by mouth daily.   Yes [provider]  metFORMIN (GLUCOPHAGE) 1000 MG tablet Take 500 mg by mouth 2 (two) times daily.  08/02/16  Yes [provider]  Omega-3 Fatty Acids (FISH OIL) 1200 MG CAPS Take 1,200 mg by mouth daily.   Yes [provider]  pantoprazole (PROTONIX) 40 MG tablet Take 1 tablet (40 mg total) by  mouth daily. 08/23/17  Yes Crissman, Jeannette How, MD  sertraline (ZOLOFT) 50 MG tablet Take 25 mg by mouth daily. 09/04/16  Yes [provider]  simvastatin (ZOCOR) 40 MG tablet Take 1 tablet (40 mg total) by mouth daily at 6 PM. 08/23/17  Yes Crissman, Jeannette How, MD  Suvorexant (BELSOMRA) 15 MG TABS Take 1 tablet by mouth at bedtime. 10/16/17   Deneise Lever, MD   Past Medical History:  Diagnosis Date  . CAD (coronary artery disease)   . CAD (coronary  artery disease) 12/31/2013   2007, 3.0 x 28 mm drug-eluting Cypher stent to RCA, 3.5 x 33 mm drug-eluting Cypher stent and proximal LAD) Consent by 2009 Normal perfusion by mildly 2013   . Dyslipidemia   . Subclavian artery stenosis, left ([place]) 01/01/2015   Past Surgical History:  Procedure Laterality Date  . BREAST BIOPSY Right 1970   EXCISIONAL - NEG  . BREAST BIOPSY Left 1973   EXCISIONAL - NEG  . CARDIAC CATHETERIZATION  08/25/2008   patent stents  . CAROTID DUPLEX  10/2006   NORMAL CAROTID ARTERY DUPLEX  . CORONARY ANGIOPLASTY WITH STENT PLACEMENT  09/17/2006   PTCA & stenting LAD  . ESOPHAGOGASTRODUODENOSCOPY N/A 04/27/2015   Procedure: ESOPHAGOGASTRODUODENOSCOPY (EGD);  Surgeon: Lucilla Lame, MD;  Location: [facility] ENDOSCOPY;  Service: Endoscopy;  Laterality: N/A;  . NM MYOCAR MULTIPLE W/SPECT  11/2011   EF 72%.NORMAL MYOCARDIAL PERFUSION STUDY  . REPLACEMENT TOTAL KNEE  2003 & 2004  . TRANSTHORACIC ECHOCARDIOGRAM  09/2006   EF =>55%. IMPAIRED LV RELAXATION. MILD MITRAL ANNULAR CALCIFICATION. AV-MILDLY SCLEROTIC. NO AS.  Marland Kitchen VAGINAL HYSTERECTOMY  1983   Family History  Problem Relation Age of Onset  . Breast cancer Mother 76  . Diabetes Sister   . Heart disease Maternal Grandmother   . Heart disease Maternal Grandfather   . Brain cancer Grandchild    Social History   Socioeconomic History  . Marital status: Married    Spouse name: Not on file  . Number of children: Not on file  . Years of education: Not on file  . Highest education level: Not on file  Social Needs  . Financial resource strain: Not on file  . Food insecurity - worry: Not on file  . Food insecurity - inability: Not on file  . Transportation needs - medical: Not on file  . Transportation needs - non-medical: Not on file  Occupational History  . Not on file  Tobacco Use  . Smoking status: Never Smoker  . Smokeless tobacco: Never Used  Substance and Sexual Activity  . Alcohol use: No    Alcohol/week: 0.0  oz  . Drug use: No  . Sexual activity: Not on file  Other Topics Concern  . Not on file  Social History Narrative  . Not on file   ROS-see HPI   + = positive Constitutional:    weight loss, night sweats, fevers, chills, fatigue, lassitude. HEENT:    headaches, difficulty swallowing, tooth/dental problems, sore throat,       Sneezing,+ itching, ear ache, nasal congestion, post nasal drip, snoring CV:    chest pain, orthopnea, PND, swelling in lower extremities, anasarca,                                  dizziness, palpitations Resp:  + shortness of breath with exertion or at rest.  productive cough,   +non-productive cough, coughing up of blood.              change in color of mucus.  wheezing.   Skin:    rash or lesions. GI:  No-   heartburn, indigestion, abdominal pain, nausea, vomiting, diarrhea,                 change in bowel habits, loss of appetite GU: dysuria, change in color of urine, no urgency or frequency.   flank pain. MS:   +joint pain, stiffness, decreased range of motion, back pain. Neuro-     nothing unusual Psych:  change in mood or affect.  +depression or +anxiety.   memory loss.  OBJ- Physical Exam General- Alert, Oriented, Affect-appropriate, Distress- none acute Skin- rash-none, lesions- none, excoriation- none Lymphadenopathy- none Head- atraumatic            Eyes- Gross vision intact, PERRLA, conjunctivae and secretions clear            Ears- Hearing, canals-normal            Nose- Clear, no-Septal dev, mucus, polyps, erosion, perforation             Throat- Mallampati II , mucosa clear , drainage- none, tonsils- atrophic Neck- flexible , trachea midline, no stridor , thyroid nl, carotid no bruit Chest - symmetrical excursion , unlabored           Heart/CV- RRR , no murmur , no gallop  , no rub, nl s1 s2                           - JVD- none , edema- none, stasis changes- none, varices- none           Lung- clear to P&A, wheeze- none, cough-  none , dullness-none, rub- none           Chest wall-  Abd-  Br/ Gen/ Rectal- Not done, not indicated Extrem- cyanosis- none, clubbing, none, atrophy- none, strength- nl Neuro- grossly intact to observation

## 2017-10-16 NOTE — Assessment & Plan Note (Signed)
I do not think this is the primary reason for her insomnia but it may contribute.  Since she has already paid for gabapentin, I will let her try that with Belsomra for insomnia.  We may end up wanting to substitute Requip for gabapentin.

## 2017-10-22 ENCOUNTER — Telehealth: Payer: Self-pay

## 2017-10-22 ENCOUNTER — Other Ambulatory Visit: Payer: Self-pay | Admitting: Family Medicine

## 2017-10-22 MED ORDER — BENZONATATE 100 MG PO CAPS: 100.0000 mg | ORAL_CAPSULE | Freq: Three times a day (TID) | ORAL | 0 refills | Status: DC

## 2017-10-22 NOTE — Telephone Encounter (Signed)
benzonatate 100mg   2 capsules TID PRN

## 2017-11-05 ENCOUNTER — Telehealth: Payer: Self-pay | Admitting: Family Medicine

## 2017-11-05 NOTE — Telephone Encounter (Signed)
Copied from [place] [id number]. Topic: Quick Communication - See Telephone Encounter >> Nov 05, 2017  2:46 PM Ether Griffins B wrote: CRM for notification. See Telephone encounter for:  Pt wanting Dr. Jeananne Rama to call in something for her yeast infection. Shes been using monistat and it hasnt helped  11/05/17.

## 2017-11-06 MED ORDER — TERBINAFINE HCL 250 MG PO TABS: 250.0000 mg | ORAL_TABLET | Freq: Every day | ORAL | 0 refills | Status: DC

## 2017-11-06 NOTE — Telephone Encounter (Signed)
Message relayed to patient. Verbalized understanding and denied questions.   

## 2017-11-06 NOTE — Telephone Encounter (Signed)
rx sent

## 2017-11-12 ENCOUNTER — Other Ambulatory Visit: Payer: Self-pay | Admitting: Family Medicine

## 2017-11-12 MED ORDER — TERBINAFINE HCL 250 MG PO TABS: 250.0000 mg | ORAL_TABLET | Freq: Once | ORAL | 0 refills | Status: AC

## 2017-11-30 DIAGNOSIS — F419 Anxiety disorder, unspecified: Secondary | ICD-10-CM | POA: Diagnosis not present

## 2017-11-30 DIAGNOSIS — F5105 Insomnia due to other mental disorder: Secondary | ICD-10-CM | POA: Diagnosis not present

## 2017-11-30 DIAGNOSIS — F330 Major depressive disorder, recurrent, mild: Secondary | ICD-10-CM | POA: Diagnosis not present

## 2017-12-06 ENCOUNTER — Ambulatory Visit (INDEPENDENT_AMBULATORY_CARE_PROVIDER_SITE_OTHER): Payer: Medicare Other | Admitting: Internal Medicine

## 2017-12-06 ENCOUNTER — Encounter: Payer: Self-pay | Admitting: Internal Medicine

## 2017-12-06 VITALS — BP 114/68 | HR 58 | Ht 65.0 in | Wt 184.0 lb

## 2017-12-06 DIAGNOSIS — T17308A Unspecified foreign body in larynx causing other injury, initial encounter: Secondary | ICD-10-CM | POA: Diagnosis not present

## 2017-12-06 DIAGNOSIS — T17320A Food in larynx causing asphyxiation, initial encounter: Secondary | ICD-10-CM

## 2017-12-06 DIAGNOSIS — G2581 Restless legs syndrome: Secondary | ICD-10-CM | POA: Diagnosis not present

## 2017-12-06 DIAGNOSIS — F5101 Primary insomnia: Secondary | ICD-10-CM

## 2017-12-06 DIAGNOSIS — W44F3XA Food entering into or through a natural orifice, initial encounter: Secondary | ICD-10-CM

## 2017-12-06 MED ORDER — ROPINIROLE HCL 0.25 MG PO TABS: ORAL_TABLET | ORAL | 3 refills | Status: DC

## 2017-12-06 NOTE — Progress Notes (Signed)
10/16/17-72 year old female never smoker (long 2cnd hand) for sleep evaluation. Medical problem list includes CAD, subclavian artery stenosis L, HBP, chronic cough, CKD 3, DM 2,       Comes with her husband Referred courtesy of  PCP Dr. Jeananne Rama for chronic insomnia- failed Remeron 15 mg, Seen at [facility] on 11/14 for sleep deprivation. Their note describes ambien helpful but dc'd, Ativan helpful but dc'd. Sees psychiatrist for for anxiety and depression. Detailed Neurology note reviewed. They assessed: anxiety and depression, Insomnia, Restless Legs. Prescribed gabapentin, checked ferritin., reviewed sleep hygiene, discussed CBT and sleep restriction.. Laura Rojas says that Neurology visit was "not helpful".  I pointed out that the note was complete and detailed with several issues pertinent to her complaint being addressed. She had used Ativan 1 mg daily times 30 years until her PCP DC'd it 2 years ago.  Since then she has had difficulty initiating and maintaining sleep.  She recognizes any important contribution from her anxiety and depression-getting tearful with me as she described death of her mother and death of her grandson as also noted in the Neurology note. Her husband is aware of her leg movement at night.  She did not start the gabapentin yet, waiting until she could discuss it with me. She denies daytime sleepiness.  Husband says she never snores significantly.  He has sleep apnea and he does not believe that she has it.  When she cannot sleep, she gets up and eats-blaming this for 26 pound weight gain in recent years. Epworth 0  12/06/17-72 year old female never smoker followed for chronic insomnia, restless legs, complicated by anxiety/depression.  Had failed Remeron.  Followed by psychiatrist. We had given Belsomra to overlap w gabapentin She still dreams, but no longer considers them nightmares.  Sleeps better with belsomra 15 plus gabapentin Somewhat groggy sometimes  and will avoid driving then.  Sleeps soundly enough at night but sometimes feels a little unsteady if she gets up.  We discussed these as potential medication effects. She again talks about how well she did remotely with Ativan.  Gabapentin  has helped restless legs. Admits choking with meals without heartburn or solid food hanging up.  ROS-see HPI   + = positive Constitutional:    weight loss, night sweats, fevers, chills, fatigue, lassitude. HEENT:    headaches, difficulty swallowing, tooth/dental problems, sore throat,       Sneezing,+ itching, ear ache, nasal congestion, post nasal drip, snoring CV:    chest pain, orthopnea, PND, swelling in lower extremities, anasarca,                                                dizziness, palpitations Resp:  + shortness of breath with exertion or at rest.                productive cough,   +non-productive cough, coughing up of blood.              change in color of mucus.  wheezing.   Skin:    rash or lesions. GI:  No-   heartburn, indigestion, abdominal pain, nausea, vomiting, diarrhea,                 change in bowel habits, loss of appetite GU: dysuria, change in color of urine, no urgency or frequency.   flank pain. MS:   +  joint pain, stiffness, decreased range of motion, back pain. Neuro-     nothing unusual Psych:  change in mood or affect.  +depression or +anxiety.   memory loss.  OBJ- Physical Exam General- Alert, Oriented, Affect-appropriate, Distress- none acute + overweight, cheerful Skin- rash-none, lesions- none, excoriation- none Lymphadenopathy- none Head- atraumatic            Eyes- Gross vision intact, PERRLA, conjunctivae and secretions clear            Ears- Hearing, canals-normal            Nose- Clear, no-Septal dev, mucus, polyps, erosion, perforation             Throat- Mallampati II , mucosa clear , drainage- none, tonsils- atrophic Neck- flexible , trachea midline, no stridor , thyroid nl, carotid no bruit Chest -  symmetrical excursion , unlabored           Heart/CV- RRR , no murmur , no gallop  , no rub, nl s1 s2                           - JVD- none , edema- none, stasis changes- none, varices- none           Lung- clear to P&A, wheeze- none, cough- none , dullness-none, rub- none           Chest wall-  Abd-  Br/ Gen/ Rectal- Not done, not indicated Extrem- cyanosis- none, clubbing, none, atrophy- none, strength- nl Neuro- grossly intact to observation

## 2017-12-06 NOTE — Patient Instructions (Addendum)
Script sent to try Requip 1-2, 30 minutes before bedtime, instead of gabaprentin, for Restless Legs.  We are hoping the Requip will give good control, with less daytime drowsiness  Order- schedule Modified Barium Swallow with Speech Therapy for swallowing assessment     For dx choking  Ok to continue Belsomra  If you don't like the Requip after a good try, ok to go back to gabapentin, but don't use both at the same time.

## 2017-12-07 ENCOUNTER — Other Ambulatory Visit (HOSPITAL_COMMUNITY): Payer: Self-pay | Admitting: Internal Medicine

## 2017-12-07 DIAGNOSIS — R1310 Dysphagia, unspecified: Secondary | ICD-10-CM

## 2017-12-09 DIAGNOSIS — T17320A Food in larynx causing asphyxiation, initial encounter: Secondary | ICD-10-CM | POA: Insufficient documentation

## 2017-12-09 NOTE — Assessment & Plan Note (Signed)
Describes choking during meals without food hanging up or recognized heartburn.  We discussed potential for aspiration.  Emphasized careful chewing and swallowing. Plan-schedule modified barium swallow with speech therapy, looking for LPR

## 2017-12-09 NOTE — Assessment & Plan Note (Signed)
Better controlled and acceptable with gabapentin.  I am concerned that this may be contributing to carryover with daytime "grogginess" that causes to her to avoid driving at times. Plan-try changing gabapentin to Requip

## 2017-12-09 NOTE — Assessment & Plan Note (Signed)
She is sleeping better and dreams are no longer described as nightmares.  She is noticing some unsteadiness if she is up at night and some grogginess in the daytime.  This might reflect combination of Belsomra plus gabapentin.  She considers herself much better off than before but we are going to try changing gabapentin to see if it helps.  If not, consider reducing Belsomra to 10 mg.

## 2017-12-17 ENCOUNTER — Other Ambulatory Visit: Payer: Self-pay | Admitting: *Deleted

## 2017-12-17 ENCOUNTER — Ambulatory Visit (HOSPITAL_COMMUNITY)
Admission: RE | Admit: 2017-12-17 | Discharge: 2017-12-17 | Disposition: A | Discharge status: Home / Self Care | Payer: Medicare Other | Source: Ambulatory Visit | Attending: Internal Medicine | Admitting: Internal Medicine

## 2017-12-17 DIAGNOSIS — T17308A Unspecified foreign body in larynx causing other injury, initial encounter: Secondary | ICD-10-CM | POA: Diagnosis not present

## 2017-12-17 DIAGNOSIS — R1310 Dysphagia, unspecified: Secondary | ICD-10-CM | POA: Insufficient documentation

## 2017-12-17 DIAGNOSIS — X58XXXA Exposure to other specified factors, initial encounter: Secondary | ICD-10-CM | POA: Insufficient documentation

## 2017-12-17 DIAGNOSIS — T17320A Food in larynx causing asphyxiation, initial encounter: Secondary | ICD-10-CM | POA: Diagnosis not present

## 2017-12-17 DIAGNOSIS — R1319 Other dysphagia: Secondary | ICD-10-CM

## 2017-12-17 NOTE — Progress Notes (Signed)
Modified Barium Swallow Progress Note  Patient Details  Name: Laura Rojas MRN: [id number] Date of Birth: 11-13-1946  Today's Date: 12/17/2017  Modified Barium Swallow completed.  Full report located under Chart Review in the Imaging Section.  Brief recommendations include the following:  Clinical Impression  Pt demonstrated a mild cervical esophageal dysphagia with a prominent cricopharyngeus leading to reduced opening of UES and trace pharyngeal residual post swallow with liquids only. Only one instance of sensed aspiration occurred when pt took a pill with liquids and UES did not open rapidly enough for large bolus. Pts cough was immediate, hard and ejected all of aspirate. Esophageal sweep did show appearance of abnormal motility but further esophageal w/u would be warranted to examine further. Would suspect an esophageal dysphagia could lead to increased difficulty at meals. Discussed basic precautions with pt. No further SLP interventions needed, defer to medical management with MD.    Ricka Burdock Evaluation Recommendations   Recommended Consults: Consider esophageal assessment;Consider GI evaluation   SLP Diet Recommendations: Regular solids;Thin liquid   Liquid Administration via: Cup;Straw   Medication Administration: Whole meds with liquid   Supervision: Patient able to self feed   Compensations: Slow rate;Small sips/bites   Postural Changes: Seated upright at 90 degrees   Oral Care Recommendations: Patient independent with oral care       [facility], MA CCC-SLP [phone]  Lynann Beaver 12/17/2017,12:44 PM

## 2017-12-18 ENCOUNTER — Encounter: Payer: Self-pay | Admitting: Gastroenterology

## 2017-12-19 ENCOUNTER — Encounter: Payer: Self-pay | Admitting: Family Medicine

## 2017-12-19 ENCOUNTER — Ambulatory Visit (INDEPENDENT_AMBULATORY_CARE_PROVIDER_SITE_OTHER): Payer: Medicare Other | Admitting: Family Medicine

## 2017-12-19 DIAGNOSIS — F5101 Primary insomnia: Secondary | ICD-10-CM | POA: Diagnosis not present

## 2017-12-19 MED ORDER — LORAZEPAM 0.5 MG PO TABS: 0.5000 mg | ORAL_TABLET | Freq: Every day | ORAL | 1 refills | Status: DC

## 2017-12-19 NOTE — Progress Notes (Signed)
BP 114/75 (BP Location: Left Arm, Patient Position: Sitting, Cuff Size: Normal)   Pulse (!) 50   Temp 97.9 F (36.6 C) (Oral)   Wt 182 lb 1.6 oz (82.6 kg)   SpO2 99%   BMI 30.30 kg/m    Subjective:    Patient ID: Laura Rojas, female    DOB: 1946/07/24, 72 y.o.   MRN: [id number]  HPI: Laura Rojas is a 72 y.o. female  Chief Complaint  Patient presents with  . Anxiety   Patient follow-up chronic anxiety.  Anxiety primarily manifested by insomnia.  Insomnia notes reviewed from Dr. Annamaria Boots in [place] and also [facility] notes from neurology.  Patient's restless legs may be getting a little better with gabapentin.  Taking Belsomra for sleep causes hangover grogginess.  This results in some staggering and difficulty, early some decreased mental alertness.  Patient also has noted vivid dreams with this medication. Further history 30 or so years ago patient was having marked anxiety especially social anxiety with choking sensation and unable to eat in restaurants.  Lorazepam was started at that time with control of her symptoms and resolution of her choking type behavior.  Patient has developed choking behavior again with barium swallow scheduled next month. Patient's lorazepam was discontinued by me 2 years ago because of concern over narcotics and popular press.  Also involved was concerned about increased risk of falling and difficulty with driving as the patient is getting older. Patient's insomnia has been steady for the last 2 years with resulting poor sleep drowsiness with driving and resulting increased risk of accidents and stumbling at night with not sleeping and rambling about.  This is been made worse with the Belsomra with a hangover effect and continued poor sleep.  Relevant past medical, surgical, family and social history reviewed and updated as indicated. Interim medical history since our last visit reviewed. Allergies and medications reviewed and updated.  Review of  Systems  Constitutional: Negative.   Respiratory: Negative.   Cardiovascular: Negative.     Per HPI unless specifically indicated above     Objective:    BP 114/75 (BP Location: Left Arm, Patient Position: Sitting, Cuff Size: Normal)   Pulse (!) 50   Temp 97.9 F (36.6 C) (Oral)   Wt 182 lb 1.6 oz (82.6 kg)   SpO2 99%   BMI 30.30 kg/m   Wt Readings from Last 3 Encounters:  12/19/17 182 lb 1.6 oz (82.6 kg)  12/06/17 184 lb (83.5 kg)  10/16/17 186 lb (84.4 kg)    Physical Exam  Constitutional: She is oriented to person, place, and time. She appears well-developed and well-nourished.  HENT:  Head: Normocephalic and atraumatic.  Eyes: Conjunctivae and EOM are normal.  Neck: Normal range of motion.  Cardiovascular: Normal rate, regular rhythm and normal heart sounds.  Pulmonary/Chest: Effort normal and breath sounds normal.  Musculoskeletal: Normal range of motion.  Neurological: She is alert and oriented to person, place, and time.  Skin: No erythema.  Psychiatric: She has a normal mood and affect. Her behavior is normal. Judgment and thought content normal.    Results for orders placed or performed in visit on 08/23/17  Microscopic Examination  Result Value Ref Range   WBC, UA 0-5 0 - 5 /hpf   RBC, UA 0-2 0 - 2 /hpf   Epithelial Cells (non renal) 0-10 0 - 10 /hpf   Renal Epithel, UA 0-10 (A) None seen /hpf   Bacteria, UA None seen None  seen/Few  CBC with Differential/Platelet  Result Value Ref Range   WBC 5.0 3.4 - 10.8 x10E3/uL   RBC 3.86 3.77 - 5.28 x10E6/uL   Hemoglobin 9.8 (L) 11.1 - 15.9 g/dL   Hematocrit 30.9 (L) 34.0 - 46.6 %   MCV 80 79 - 97 fL   MCH 25.4 (L) 26.6 - 33.0 pg   MCHC 31.7 31.5 - 35.7 g/dL   RDW 16.2 (H) 12.3 - 15.4 %   Platelets 322 150 - 379 x10E3/uL   Neutrophils 48 Not Estab. %   Lymphs 33 Not Estab. %   Monocytes 9 Not Estab. %   Eos 9 Not Estab. %   Basos 1 Not Estab. %   Neutrophils Absolute 2.4 1.4 - 7.0 x10E3/uL   Lymphocytes  Absolute 1.7 0.7 - 3.1 x10E3/uL   Monocytes Absolute 0.5 0.1 - 0.9 x10E3/uL   EOS (ABSOLUTE) 0.4 0.0 - 0.4 x10E3/uL   Basophils Absolute 0.0 0.0 - 0.2 x10E3/uL   Immature Granulocytes 0 Not Estab. %   Immature Grans (Abs) 0.0 0.0 - 0.1 x10E3/uL  Comprehensive metabolic panel  Result Value Ref Range   Glucose 223 (H) 65 - 99 mg/dL   BUN 18 8 - 27 mg/dL   Creatinine, Ser 1.23 (H) 0.57 - 1.00 mg/dL   GFR calc non Af Amer 44 (L) >59 mL/min/1.73   GFR calc Af Amer 51 (L) >59 mL/min/1.73   BUN/Creatinine Ratio 15 12 - 28   Sodium 140 134 - 144 mmol/L   Potassium 4.4 3.5 - 5.2 mmol/L   Chloride 101 96 - 106 mmol/L   CO2 23 20 - 29 mmol/L   Calcium 9.6 8.7 - 10.3 mg/dL   Total Protein 6.4 6.0 - 8.5 g/dL   Albumin 4.3 3.5 - 4.8 g/dL   Globulin, Total 2.1 1.5 - 4.5 g/dL   Albumin/Globulin Ratio 2.0 1.2 - 2.2   Bilirubin Total 0.3 0.0 - 1.2 mg/dL   Alkaline Phosphatase 133 (H) 39 - 117 IU/L   AST 31 0 - 40 IU/L   ALT 25 0 - 32 IU/L  Lipid panel  Result Value Ref Range   Cholesterol, Total 133 100 - 199 mg/dL   Triglycerides 291 (H) 0 - 149 mg/dL   HDL 38 (L) >39 mg/dL   VLDL Cholesterol Cal 58 (H) 5 - 40 mg/dL   LDL Calculated 37 0 - 99 mg/dL   Chol/HDL Ratio 3.5 0.0 - 4.4 ratio  TSH  Result Value Ref Range   TSH 2.810 0.450 - 4.500 uIU/mL  Urinalysis, Routine w reflex microscopic  Result Value Ref Range   Specific Gravity, UA 1.020 1.005 - 1.030   pH, UA 6.5 5.0 - 7.5   Color, UA Yellow Yellow   Appearance Ur Hazy (A) Clear   Leukocytes, UA Trace (A) Negative   Protein, UA Negative Negative/Trace   Glucose, UA Negative Negative   Ketones, UA Negative Negative   RBC, UA Negative Negative   Bilirubin, UA Negative Negative   Urobilinogen, Ur 0.2 0.2 - 1.0 mg/dL   Nitrite, UA Negative Negative   Microscopic Examination See below:       Assessment & Plan:   Problem List Items Addressed This Visit      Other   Insomnia    For insomnia on review of patient's chart and  medication will discontinue Belsomra because of side effects primarily feeling clumsy and unsteady on her feet.  This is more so than patient ever experienced on lorazepam.  Lorazepam also never had any hung over effect that she does with Belsomra.  All in all looking at benefit risk ratio feel that lorazepam may be the safer of the options.  But I reviewed all this risk benefit with the patient and her husband. Will continue gabapentin and observe response. Will observe choking type sensation and if goes away with lorazepam will not pursue the barium swallow. Because of patient not on a 30-year history of lorazepam will start with 0.5 mg at bedtime and observe response.          Follow up plan: Return in about 4 weeks (around 01/16/2018) for Recheck insomnia and medication.Marland Kitchen

## 2017-12-19 NOTE — Assessment & Plan Note (Signed)
For insomnia on review of patient's chart and medication will discontinue Belsomra because of side effects primarily feeling clumsy and unsteady on her feet.  This is more so than patient ever experienced on lorazepam.  Lorazepam also never had any hung over effect that she does with Belsomra.  All in all looking at benefit risk ratio feel that lorazepam may be the safer of the options.  But I reviewed all this risk benefit with the patient and her husband. Will continue gabapentin and observe response. Will observe choking type sensation and if goes away with lorazepam will not pursue the barium swallow. Because of patient not on a 30-year history of lorazepam will start with 0.5 mg at bedtime and observe response.

## 2017-12-25 ENCOUNTER — Encounter: Payer: Self-pay | Admitting: Internal Medicine

## 2018-01-09 ENCOUNTER — Ambulatory Visit (INDEPENDENT_AMBULATORY_CARE_PROVIDER_SITE_OTHER): Payer: Medicare Other | Admitting: Family Medicine

## 2018-01-09 ENCOUNTER — Encounter: Payer: Self-pay | Admitting: Family Medicine

## 2018-01-09 DIAGNOSIS — F413 Other mixed anxiety disorders: Secondary | ICD-10-CM | POA: Diagnosis not present

## 2018-01-09 DIAGNOSIS — F5101 Primary insomnia: Secondary | ICD-10-CM | POA: Diagnosis not present

## 2018-01-09 DIAGNOSIS — L309 Dermatitis, unspecified: Secondary | ICD-10-CM | POA: Diagnosis not present

## 2018-01-09 DIAGNOSIS — I10 Essential (primary) hypertension: Secondary | ICD-10-CM

## 2018-01-09 MED ORDER — TRIAMCINOLONE ACETONIDE 0.1 % EX CREA: 1.0000 "application " | TOPICAL_CREAM | Freq: Two times a day (BID) | CUTANEOUS | 0 refills | Status: DC

## 2018-01-09 MED ORDER — LORAZEPAM 0.5 MG PO TABS: 0.5000 mg | ORAL_TABLET | Freq: Every day | ORAL | 1 refills | Status: DC

## 2018-01-09 NOTE — Assessment & Plan Note (Signed)
Chronic anxiety treated best with lorazepam.  Patient's tried multiple other agents and has chronic insomnia along with anxiety which on multiple extensive agents has been unsuccessful.  This has included multiple specialist visits. See note from January 2019 for details.

## 2018-01-09 NOTE — Progress Notes (Signed)
BP 139/80   Pulse 69   Ht 5\' 5"  (1.651 m)   Wt 180 lb (81.6 kg)   BMI 29.95 kg/m    Subjective:    Patient ID: Laura Rojas, female    DOB: 09/18/1946, 72 y.o.   MRN: [id number]  HPI: Laura Rojas is a 72 y.o. female  Chief Complaint  Patient presents with  . Follow-up   She is all in all on review is doing good is back to her old self.  Patient's even signed up for a yoga class but friends family husband have all noticed a marked improvement in the patient's outlook and demeanor. Patient certainly has more energy and is able to get through the day without problems. No side effects from medications no weakness dizziness unsteadiness on feet.  Relevant past medical, surgical, family and social history reviewed and updated as indicated. Interim medical history since our last visit reviewed. Allergies and medications reviewed and updated.  Review of Systems  Constitutional: Negative.   Respiratory: Negative.   Cardiovascular: Negative.     Per HPI unless specifically indicated above     Objective:    BP 139/80   Pulse 69   Ht 5\' 5"  (1.651 m)   Wt 180 lb (81.6 kg)   BMI 29.95 kg/m   Wt Readings from Last 3 Encounters:  01/09/18 180 lb (81.6 kg)  12/19/17 182 lb 1.6 oz (82.6 kg)  12/06/17 184 lb (83.5 kg)    Physical Exam  Constitutional: She is oriented to person, place, and time. She appears well-developed and well-nourished.  HENT:  Head: Normocephalic and atraumatic.  Eyes: Conjunctivae and EOM are normal.  Neck: Normal range of motion.  Cardiovascular: Normal rate, regular rhythm and normal heart sounds.  Pulmonary/Chest: Effort normal and breath sounds normal.  Musculoskeletal: Normal range of motion.  Neurological: She is alert and oriented to person, place, and time.  Skin: No erythema.  Psychiatric: She has a normal mood and affect. Her behavior is normal. Judgment and thought content normal.    Results for orders placed or performed in visit on  08/23/17  Microscopic Examination  Result Value Ref Range   WBC, UA 0-5 0 - 5 /hpf   RBC, UA 0-2 0 - 2 /hpf   Epithelial Cells (non renal) 0-10 0 - 10 /hpf   Renal Epithel, UA 0-10 (A) None seen /hpf   Bacteria, UA None seen None seen/Few  CBC with Differential/Platelet  Result Value Ref Range   WBC 5.0 3.4 - 10.8 x10E3/uL   RBC 3.86 3.77 - 5.28 x10E6/uL   Hemoglobin 9.8 (L) 11.1 - 15.9 g/dL   Hematocrit 30.9 (L) 34.0 - 46.6 %   MCV 80 79 - 97 fL   MCH 25.4 (L) 26.6 - 33.0 pg   MCHC 31.7 31.5 - 35.7 g/dL   RDW 16.2 (H) 12.3 - 15.4 %   Platelets 322 150 - 379 x10E3/uL   Neutrophils 48 Not Estab. %   Lymphs 33 Not Estab. %   Monocytes 9 Not Estab. %   Eos 9 Not Estab. %   Basos 1 Not Estab. %   Neutrophils Absolute 2.4 1.4 - 7.0 x10E3/uL   Lymphocytes Absolute 1.7 0.7 - 3.1 x10E3/uL   Monocytes Absolute 0.5 0.1 - 0.9 x10E3/uL   EOS (ABSOLUTE) 0.4 0.0 - 0.4 x10E3/uL   Basophils Absolute 0.0 0.0 - 0.2 x10E3/uL   Immature Granulocytes 0 Not Estab. %   Immature Grans (Abs) 0.0 0.0 -  0.1 x10E3/uL  Comprehensive metabolic panel  Result Value Ref Range   Glucose 223 (H) 65 - 99 mg/dL   BUN 18 8 - 27 mg/dL   Creatinine, Ser 1.23 (H) 0.57 - 1.00 mg/dL   GFR calc non Af Amer 44 (L) >59 mL/min/1.73   GFR calc Af Amer 51 (L) >59 mL/min/1.73   BUN/Creatinine Ratio 15 12 - 28   Sodium 140 134 - 144 mmol/L   Potassium 4.4 3.5 - 5.2 mmol/L   Chloride 101 96 - 106 mmol/L   CO2 23 20 - 29 mmol/L   Calcium 9.6 8.7 - 10.3 mg/dL   Total Protein 6.4 6.0 - 8.5 g/dL   Albumin 4.3 3.5 - 4.8 g/dL   Globulin, Total 2.1 1.5 - 4.5 g/dL   Albumin/Globulin Ratio 2.0 1.2 - 2.2   Bilirubin Total 0.3 0.0 - 1.2 mg/dL   Alkaline Phosphatase 133 (H) 39 - 117 IU/L   AST 31 0 - 40 IU/L   ALT 25 0 - 32 IU/L  Lipid panel  Result Value Ref Range   Cholesterol, Total 133 100 - 199 mg/dL   Triglycerides 291 (H) 0 - 149 mg/dL   HDL 38 (L) >39 mg/dL   VLDL Cholesterol Cal 58 (H) 5 - 40 mg/dL   LDL  Calculated 37 0 - 99 mg/dL   Chol/HDL Ratio 3.5 0.0 - 4.4 ratio  TSH  Result Value Ref Range   TSH 2.810 0.450 - 4.500 uIU/mL  Urinalysis, Routine w reflex microscopic  Result Value Ref Range   Specific Gravity, UA 1.020 1.005 - 1.030   pH, UA 6.5 5.0 - 7.5   Color, UA Yellow Yellow   Appearance Ur Hazy (A) Clear   Leukocytes, UA Trace (A) Negative   Protein, UA Negative Negative/Trace   Glucose, UA Negative Negative   Ketones, UA Negative Negative   RBC, UA Negative Negative   Bilirubin, UA Negative Negative   Urobilinogen, Ur 0.2 0.2 - 1.0 mg/dL   Nitrite, UA Negative Negative   Microscopic Examination See below:       Assessment & Plan:   Problem List Items Addressed This Visit      Cardiovascular and Mediastinum   HTN (hypertension)    The current medical regimen is effective;  continue present plan and medications.         Musculoskeletal and Integument   Dermatitis    Back dermatitis and is been treated well with triamcinolone 1% cream previously gave prescription        Other   Anxiety disorder   Relevant Medications   LORazepam (ATIVAN) 0.5 MG tablet   Insomnia    Chronic anxiety treated best with lorazepam.  Patient's tried multiple other agents and has chronic insomnia along with anxiety which on multiple extensive agents has been unsuccessful.  This has included multiple specialist visits. See note from January 2019 for details.          Follow up plan: No Follow-up on file.

## 2018-01-09 NOTE — Assessment & Plan Note (Signed)
Back dermatitis and is been treated well with triamcinolone 1% cream previously gave prescription

## 2018-01-09 NOTE — Assessment & Plan Note (Signed)
The current medical regimen is effective;  continue present plan and medications.  

## 2018-01-14 ENCOUNTER — Encounter: Payer: Self-pay | Admitting: Family Medicine

## 2018-01-14 ENCOUNTER — Ambulatory Visit (INDEPENDENT_AMBULATORY_CARE_PROVIDER_SITE_OTHER): Payer: Medicare Other | Admitting: Family Medicine

## 2018-01-14 VITALS — BP 122/75 | HR 71 | Temp 99.0°F | Wt 180.2 lb

## 2018-01-14 DIAGNOSIS — J069 Acute upper respiratory infection, unspecified: Secondary | ICD-10-CM | POA: Diagnosis not present

## 2018-01-14 MED ORDER — HYDROCOD POLST-CPM POLST ER 10-8 MG/5ML PO SUER: 5.0000 mL | Freq: Two times a day (BID) | ORAL | 0 refills | Status: DC | PRN

## 2018-01-14 MED ORDER — BENZONATATE 200 MG PO CAPS: 200.0000 mg | ORAL_CAPSULE | Freq: Three times a day (TID) | ORAL | 0 refills | Status: DC | PRN

## 2018-01-14 MED ORDER — AZITHROMYCIN 250 MG PO TABS: ORAL_TABLET | ORAL | 0 refills | Status: DC

## 2018-01-14 NOTE — Progress Notes (Signed)
BP 122/75 (BP Location: Right Arm, Patient Position: Sitting, Cuff Size: Normal)   Pulse 71   Temp 99 F (37.2 C) (Tympanic)   Wt 180 lb 3.2 oz (81.7 kg)   SpO2 96%   BMI 29.99 kg/m    Subjective:    Patient ID: Laura Rojas, female    DOB: 06-18-1946, 72 y.o.   MRN: [id number]  HPI: Laura Rojas is a 72 y.o. female  Chief Complaint  Patient presents with  . Cough    x's 3 days  . Wheezing   Pt here with 3 day hx of significant hacking cough, chest tightness, wheezing, and congestion. Denies fever, chills, sweats, CP, sore throat. Trying OTC cough syrup and some amoxicillin over the weekend that she had left over (2 doses each day). No sick contacts.   Relevant past medical, surgical, family and social history reviewed and updated as indicated. Interim medical history since our last visit reviewed. Allergies and medications reviewed and updated.  Review of Systems  Per HPI unless specifically indicated above     Objective:    BP 122/75 (BP Location: Right Arm, Patient Position: Sitting, Cuff Size: Normal)   Pulse 71   Temp 99 F (37.2 C) (Tympanic)   Wt 180 lb 3.2 oz (81.7 kg)   SpO2 96%   BMI 29.99 kg/m   Wt Readings from Last 3 Encounters:  01/14/18 180 lb 3.2 oz (81.7 kg)  01/09/18 180 lb (81.6 kg)  12/19/17 182 lb 1.6 oz (82.6 kg)    Physical Exam  Constitutional: She is oriented to person, place, and time. She appears well-developed and well-nourished. No distress.  HENT:  Head: Atraumatic.  Right Ear: External ear normal.  Left Ear: External ear normal.  Nose: Nose normal.  Mouth/Throat: No oropharyngeal exudate.  Oropharynx erythematous  Eyes: Conjunctivae are normal. Pupils are equal, round, and reactive to light. No scleral icterus.  Neck: Normal range of motion. Neck supple.  Cardiovascular: Normal rate and normal heart sounds.  Pulmonary/Chest: Effort normal. No respiratory distress. She has wheezes. She has no rales.  Musculoskeletal:  Normal range of motion.  Neurological: She is alert and oriented to person, place, and time.  Skin: Skin is warm and dry.  Psychiatric: She has a normal mood and affect. Her behavior is normal.  Nursing note and vitals reviewed.  Results for orders placed or performed in visit on 08/23/17  Microscopic Examination  Result Value Ref Range   WBC, UA 0-5 0 - 5 /hpf   RBC, UA 0-2 0 - 2 /hpf   Epithelial Cells (non renal) 0-10 0 - 10 /hpf   Renal Epithel, UA 0-10 (A) None seen /hpf   Bacteria, UA None seen None seen/Few  CBC with Differential/Platelet  Result Value Ref Range   WBC 5.0 3.4 - 10.8 x10E3/uL   RBC 3.86 3.77 - 5.28 x10E6/uL   Hemoglobin 9.8 (L) 11.1 - 15.9 g/dL   Hematocrit 30.9 (L) 34.0 - 46.6 %   MCV 80 79 - 97 fL   MCH 25.4 (L) 26.6 - 33.0 pg   MCHC 31.7 31.5 - 35.7 g/dL   RDW 16.2 (H) 12.3 - 15.4 %   Platelets 322 150 - 379 x10E3/uL   Neutrophils 48 Not Estab. %   Lymphs 33 Not Estab. %   Monocytes 9 Not Estab. %   Eos 9 Not Estab. %   Basos 1 Not Estab. %   Neutrophils Absolute 2.4 1.4 - 7.0 x10E3/uL  Lymphocytes Absolute 1.7 0.7 - 3.1 x10E3/uL   Monocytes Absolute 0.5 0.1 - 0.9 x10E3/uL   EOS (ABSOLUTE) 0.4 0.0 - 0.4 x10E3/uL   Basophils Absolute 0.0 0.0 - 0.2 x10E3/uL   Immature Granulocytes 0 Not Estab. %   Immature Grans (Abs) 0.0 0.0 - 0.1 x10E3/uL  Comprehensive metabolic panel  Result Value Ref Range   Glucose 223 (H) 65 - 99 mg/dL   BUN 18 8 - 27 mg/dL   Creatinine, Ser 1.23 (H) 0.57 - 1.00 mg/dL   GFR calc non Af Amer 44 (L) >59 mL/min/1.73   GFR calc Af Amer 51 (L) >59 mL/min/1.73   BUN/Creatinine Ratio 15 12 - 28   Sodium 140 134 - 144 mmol/L   Potassium 4.4 3.5 - 5.2 mmol/L   Chloride 101 96 - 106 mmol/L   CO2 23 20 - 29 mmol/L   Calcium 9.6 8.7 - 10.3 mg/dL   Total Protein 6.4 6.0 - 8.5 g/dL   Albumin 4.3 3.5 - 4.8 g/dL   Globulin, Total 2.1 1.5 - 4.5 g/dL   Albumin/Globulin Ratio 2.0 1.2 - 2.2   Bilirubin Total 0.3 0.0 - 1.2 mg/dL    Alkaline Phosphatase 133 (H) 39 - 117 IU/L   AST 31 0 - 40 IU/L   ALT 25 0 - 32 IU/L  Lipid panel  Result Value Ref Range   Cholesterol, Total 133 100 - 199 mg/dL   Triglycerides 291 (H) 0 - 149 mg/dL   HDL 38 (L) >39 mg/dL   VLDL Cholesterol Cal 58 (H) 5 - 40 mg/dL   LDL Calculated 37 0 - 99 mg/dL   Chol/HDL Ratio 3.5 0.0 - 4.4 ratio  TSH  Result Value Ref Range   TSH 2.810 0.450 - 4.500 uIU/mL  Urinalysis, Routine w reflex microscopic  Result Value Ref Range   Specific Gravity, UA 1.020 1.005 - 1.030   pH, UA 6.5 5.0 - 7.5   Color, UA Yellow Yellow   Appearance Ur Hazy (A) Clear   Leukocytes, UA Trace (A) Negative   Protein, UA Negative Negative/Trace   Glucose, UA Negative Negative   Ketones, UA Negative Negative   RBC, UA Negative Negative   Bilirubin, UA Negative Negative   Urobilinogen, Ur 0.2 0.2 - 1.0 mg/dL   Nitrite, UA Negative Negative   Microscopic Examination See below:       Assessment & Plan:   Problem List Items Addressed This Visit    None    Visit Diagnoses    Upper respiratory tract infection, unspecified type    -  Primary   Will tx with zpack, tessalon, and tussionex. Supportive care and sedation precautions reviewed. F/u if worsening or no improvement   Relevant Medications   azithromycin (ZITHROMAX) 250 MG tablet       Follow up plan: Return if symptoms worsen or fail to improve.

## 2018-01-14 NOTE — Patient Instructions (Signed)
Follow up as needed

## 2018-01-15 DIAGNOSIS — F5105 Insomnia due to other mental disorder: Secondary | ICD-10-CM | POA: Diagnosis not present

## 2018-01-15 DIAGNOSIS — F419 Anxiety disorder, unspecified: Secondary | ICD-10-CM | POA: Diagnosis not present

## 2018-01-15 DIAGNOSIS — F330 Major depressive disorder, recurrent, mild: Secondary | ICD-10-CM | POA: Diagnosis not present

## 2018-01-23 ENCOUNTER — Ambulatory Visit: Payer: Medicare Other | Admitting: Gastroenterology

## 2018-02-01 DIAGNOSIS — Z72820 Sleep deprivation: Secondary | ICD-10-CM | POA: Diagnosis not present

## 2018-02-01 DIAGNOSIS — E785 Hyperlipidemia, unspecified: Secondary | ICD-10-CM | POA: Diagnosis not present

## 2018-02-01 DIAGNOSIS — E781 Pure hyperglyceridemia: Secondary | ICD-10-CM | POA: Diagnosis not present

## 2018-02-01 DIAGNOSIS — E1165 Type 2 diabetes mellitus with hyperglycemia: Secondary | ICD-10-CM | POA: Diagnosis not present

## 2018-02-01 DIAGNOSIS — E11649 Type 2 diabetes mellitus with hypoglycemia without coma: Secondary | ICD-10-CM | POA: Diagnosis not present

## 2018-02-01 DIAGNOSIS — Z794 Long term (current) use of insulin: Secondary | ICD-10-CM | POA: Diagnosis not present

## 2018-02-01 DIAGNOSIS — E538 Deficiency of other specified B group vitamins: Secondary | ICD-10-CM | POA: Diagnosis not present

## 2018-02-04 ENCOUNTER — Other Ambulatory Visit: Payer: Self-pay | Admitting: Family Medicine

## 2018-02-07 ENCOUNTER — Ambulatory Visit: Payer: Self-pay | Admitting: Internal Medicine

## 2018-02-18 DIAGNOSIS — M7052 Other bursitis of knee, left knee: Secondary | ICD-10-CM | POA: Diagnosis not present

## 2018-02-18 DIAGNOSIS — Z96653 Presence of artificial knee joint, bilateral: Secondary | ICD-10-CM | POA: Diagnosis not present

## 2018-02-18 DIAGNOSIS — M1712 Unilateral primary osteoarthritis, left knee: Secondary | ICD-10-CM | POA: Diagnosis not present

## 2018-02-18 DIAGNOSIS — G5701 Lesion of sciatic nerve, right lower limb: Secondary | ICD-10-CM | POA: Diagnosis not present

## 2018-02-20 ENCOUNTER — Ambulatory Visit: Payer: Medicare Other | Admitting: Family Medicine

## 2018-02-25 DIAGNOSIS — Z96652 Presence of left artificial knee joint: Secondary | ICD-10-CM | POA: Diagnosis not present

## 2018-02-25 DIAGNOSIS — M1712 Unilateral primary osteoarthritis, left knee: Secondary | ICD-10-CM | POA: Diagnosis not present

## 2018-03-06 ENCOUNTER — Ambulatory Visit (INDEPENDENT_AMBULATORY_CARE_PROVIDER_SITE_OTHER): Payer: Medicare Other | Admitting: Family Medicine

## 2018-03-06 ENCOUNTER — Encounter: Payer: Self-pay | Admitting: Family Medicine

## 2018-03-06 VITALS — BP 128/76 | HR 80 | Ht 65.0 in | Wt 176.0 lb

## 2018-03-06 DIAGNOSIS — D649 Anemia, unspecified: Secondary | ICD-10-CM | POA: Diagnosis not present

## 2018-03-06 DIAGNOSIS — M11262 Other chondrocalcinosis, left knee: Secondary | ICD-10-CM

## 2018-03-06 MED ORDER — COLCHICINE 0.6 MG PO TABS: 0.6000 mg | ORAL_TABLET | Freq: Every day | ORAL | 3 refills | Status: DC

## 2018-03-06 NOTE — Assessment & Plan Note (Addendum)
Discussed pseudogout. Discussed culture from arthrocentesis is not back yet on [facility] chart review patient will call [facility] tomorrow to further assess culture results. Discussed usual treatment with nonsteroidal anti-inflammatory drugs but because of CKD class III will only be able to use and very slight amounts using Advil or Aleve every other day or so. Patient will try colchicine again at much lower dose at half a tablet every day or every other day to see if she gets some relief. May also use Tylenol. Because of possible disease association also checking patient's iron stores with ferritin etc.

## 2018-03-06 NOTE — Progress Notes (Signed)
BP 128/76   Pulse 80   Ht 5\' 5"  (1.651 m)   Wt 176 lb (79.8 kg)   SpO2 99%   BMI 29.29 kg/m    Subjective:    Patient ID: Laura Rojas, female    DOB: 17-Sep-1946, 72 y.o.   MRN: [id number]  HPI: Laura Rojas is a 72 y.o. female  Chief Complaint  Patient presents with  . Gout    Pseudo gout d/x by [facility]. Was given colchicine  but cannot take.    She is on review of chart notes from [place] reveals pseudogout diagnosis of left knee.  Patient concerned because of reports about infection in her knee and not on antibiotics.  Knee also still hurting. On chart review culture and sensitivity report is not reported yet. Patient does have diagnosis of pseudogout tried colchicine but took high dose treatment as in the old days, and patient had predictable side effects of diarrhea severe with nausea vomiting.  She is not taking colchicine since and still has knee pain. Patient has also generalized myalgias and aches and pains.  Of arthritis. Also due to diabetes has CKD 3  Relevant past medical, surgical, family and social history reviewed and updated as indicated. Interim medical history since our last visit reviewed. Allergies and medications reviewed and updated.  Review of Systems  Constitutional: Negative.   Respiratory: Negative.   Cardiovascular: Negative.     Per HPI unless specifically indicated above     Objective:    BP 128/76   Pulse 80   Ht 5\' 5"  (1.651 m)   Wt 176 lb (79.8 kg)   SpO2 99%   BMI 29.29 kg/m   Wt Readings from Last 3 Encounters:  03/06/18 176 lb (79.8 kg)  01/14/18 180 lb 3.2 oz (81.7 kg)  01/09/18 180 lb (81.6 kg)    Physical Exam  Constitutional: She is oriented to person, place, and time. She appears well-developed and well-nourished. No distress.  HENT:  Head: Normocephalic and atraumatic.  Right Ear: Hearing normal.  Left Ear: Hearing normal.  Nose: Nose normal.  Eyes: Conjunctivae and lids are normal. Right eye exhibits no discharge.  Left eye exhibits no discharge. No scleral icterus.  Cardiovascular: Normal rate, regular rhythm and normal heart sounds.  Pulmonary/Chest: Effort normal. No respiratory distress.  Musculoskeletal: Normal range of motion.  Patient's left knee very mildly swollen not red somewhat tender but nothing really painful.  Neurological: She is alert and oriented to person, place, and time.  Skin: Skin is intact. No rash noted.  Psychiatric: She has a normal mood and affect. Her speech is normal and behavior is normal. Judgment and thought content normal. Cognition and memory are normal.    Results for orders placed or performed in visit on 08/23/17  Microscopic Examination  Result Value Ref Range   WBC, UA 0-5 0 - 5 /hpf   RBC, UA 0-2 0 - 2 /hpf   Epithelial Cells (non renal) 0-10 0 - 10 /hpf   Renal Epithel, UA 0-10 (A) None seen /hpf   Bacteria, UA None seen None seen/Few  CBC with Differential/Platelet  Result Value Ref Range   WBC 5.0 3.4 - 10.8 x10E3/uL   RBC 3.86 3.77 - 5.28 x10E6/uL   Hemoglobin 9.8 (L) 11.1 - 15.9 g/dL   Hematocrit 30.9 (L) 34.0 - 46.6 %   MCV 80 79 - 97 fL   MCH 25.4 (L) 26.6 - 33.0 pg   MCHC 31.7 31.5 -  35.7 g/dL   RDW 16.2 (H) 12.3 - 15.4 %   Platelets 322 150 - 379 x10E3/uL   Neutrophils 48 Not Estab. %   Lymphs 33 Not Estab. %   Monocytes 9 Not Estab. %   Eos 9 Not Estab. %   Basos 1 Not Estab. %   Neutrophils Absolute 2.4 1.4 - 7.0 x10E3/uL   Lymphocytes Absolute 1.7 0.7 - 3.1 x10E3/uL   Monocytes Absolute 0.5 0.1 - 0.9 x10E3/uL   EOS (ABSOLUTE) 0.4 0.0 - 0.4 x10E3/uL   Basophils Absolute 0.0 0.0 - 0.2 x10E3/uL   Immature Granulocytes 0 Not Estab. %   Immature Grans (Abs) 0.0 0.0 - 0.1 x10E3/uL  Comprehensive metabolic panel  Result Value Ref Range   Glucose 223 (H) 65 - 99 mg/dL   BUN 18 8 - 27 mg/dL   Creatinine, Ser 1.23 (H) 0.57 - 1.00 mg/dL   GFR calc non Af Amer 44 (L) >59 mL/min/1.73   GFR calc Af Amer 51 (L) >59 mL/min/1.73    BUN/Creatinine Ratio 15 12 - 28   Sodium 140 134 - 144 mmol/L   Potassium 4.4 3.5 - 5.2 mmol/L   Chloride 101 96 - 106 mmol/L   CO2 23 20 - 29 mmol/L   Calcium 9.6 8.7 - 10.3 mg/dL   Total Protein 6.4 6.0 - 8.5 g/dL   Albumin 4.3 3.5 - 4.8 g/dL   Globulin, Total 2.1 1.5 - 4.5 g/dL   Albumin/Globulin Ratio 2.0 1.2 - 2.2   Bilirubin Total 0.3 0.0 - 1.2 mg/dL   Alkaline Phosphatase 133 (H) 39 - 117 IU/L   AST 31 0 - 40 IU/L   ALT 25 0 - 32 IU/L  Lipid panel  Result Value Ref Range   Cholesterol, Total 133 100 - 199 mg/dL   Triglycerides 291 (H) 0 - 149 mg/dL   HDL 38 (L) >39 mg/dL   VLDL Cholesterol Cal 58 (H) 5 - 40 mg/dL   LDL Calculated 37 0 - 99 mg/dL   Chol/HDL Ratio 3.5 0.0 - 4.4 ratio  TSH  Result Value Ref Range   TSH 2.810 0.450 - 4.500 uIU/mL  Urinalysis, Routine w reflex microscopic  Result Value Ref Range   Specific Gravity, UA 1.020 1.005 - 1.030   pH, UA 6.5 5.0 - 7.5   Color, UA Yellow Yellow   Appearance Ur Hazy (A) Clear   Leukocytes, UA Trace (A) Negative   Protein, UA Negative Negative/Trace   Glucose, UA Negative Negative   Ketones, UA Negative Negative   RBC, UA Negative Negative   Bilirubin, UA Negative Negative   Urobilinogen, Ur 0.2 0.2 - 1.0 mg/dL   Nitrite, UA Negative Negative   Microscopic Examination See below:       Assessment & Plan:   Problem List Items Addressed This Visit      Musculoskeletal and Integument   Pseudogout of knee, left    Discussed pseudogout. Discussed culture from arthrocentesis is not back yet on [facility] chart review patient will call [facility] tomorrow to further assess culture results. Discussed usual treatment with nonsteroidal anti-inflammatory drugs but because of CKD class III will only be able to use and very slight amounts using Advil or Aleve every other day or so. Patient will try colchicine again at much lower dose at half a tablet every day or every other day to see if she gets some relief. May also use  Tylenol. Because of possible disease association also checking patient's iron stores with  ferritin etc.       Relevant Medications   colchicine 0.6 MG tablet   Other Relevant Orders   Iron and TIBC   Ferritin    Other Visit Diagnoses    Anemia, unspecified type    -  Primary   Relevant Medications   colchicine 0.6 MG tablet   Other Relevant Orders   Iron and TIBC   Ferritin       Follow up plan: Return in about 1 month (around 04/03/2018) for Recheck patient's condition.Marland Kitchen

## 2018-03-07 ENCOUNTER — Telehealth: Payer: Self-pay | Admitting: Family Medicine

## 2018-03-07 LAB — IRON AND TIBC
Iron Saturation: 9 % — CL (ref 15–55)
Iron: 26 ug/dL — ABNORMAL LOW (ref 27–139)
Total Iron Binding Capacity: 297 ug/dL (ref 250–450)
UIBC: 271 ug/dL (ref 118–369)

## 2018-03-07 LAB — FERRITIN: Ferritin: 29 ng/mL (ref 15–150)

## 2018-03-07 NOTE — Telephone Encounter (Signed)
Phone call Discussed with patient no comorbid conditions with pseudogout with elevated iron patient's iron in fact is low patient will take an iron supplement and further evaluate as appropriate.

## 2018-03-07 NOTE — Telephone Encounter (Signed)
-----   Message from Amada Kingfisher, [place] sent at 03/07/2018  4:45 PM EDT ----- Patient was transferred to provider for telephone conversation.

## 2018-03-27 ENCOUNTER — Ambulatory Visit (INDEPENDENT_AMBULATORY_CARE_PROVIDER_SITE_OTHER): Payer: Medicare Other | Admitting: Family Medicine

## 2018-03-27 ENCOUNTER — Other Ambulatory Visit: Payer: Self-pay | Admitting: Cardiovascular Disease

## 2018-03-27 VITALS — BP 145/70 | HR 72 | Temp 97.9°F | Wt 177.2 lb

## 2018-03-27 DIAGNOSIS — M2550 Pain in unspecified joint: Secondary | ICD-10-CM

## 2018-03-27 MED ORDER — TRIAMCINOLONE ACETONIDE 40 MG/ML IJ SUSP
40.0000 mg | Freq: Once | INTRAMUSCULAR | Status: AC
  Administered 2018-03-27: 40 mg via INTRAMUSCULAR

## 2018-03-27 NOTE — Progress Notes (Addendum)
BP (!) 145/70 (BP Location: Right Arm, Patient Position: Sitting)   Pulse 72   Temp 97.9 F (36.6 C) (Oral)   Wt 177 lb 3.2 oz (80.4 kg)   SpO2 98%   BMI 29.49 kg/m    Subjective:    Patient ID: Laura Rojas, female    DOB: [birth year], 72 y.o.   MRN: [id number]  HPI: Laura Rojas is a 72 y.o. female  Chief Complaint  Patient presents with  . Shoulder Pain    left shoulder pain for 2 weeks with no relief    Pt here today with left shoulder pain that's been present for 2 months but much worse the past 2 weeks with decreased ROM. Also having finger stiffness, b/l hip pain. Pain is not sharp but a very "strong" pain, mainly with movement. Worst when she first wakes up, eventually improves as she gets up and moving throughout the day but never fully goes away. States she never really had issues with joint pain until this. Taking tylenol and occasional aleve (knows she needs to limit due to renal issues) which hasn't been helping. Currently being treated for pseudogout at [facility]. Denies fevers, chills, fatigue, known tick bites (but does states she spends a lot of time outside), rashes, joint swelling.   Relevant past medical, surgical, family and social history reviewed and updated as indicated. Interim medical history since our last visit reviewed. Allergies and medications reviewed and updated.  Review of Systems  Per HPI unless specifically indicated above     Objective:    BP (!) 145/70 (BP Location: Right Arm, Patient Position: Sitting)   Pulse 72   Temp 97.9 F (36.6 C) (Oral)   Wt 177 lb 3.2 oz (80.4 kg)   SpO2 98%   BMI 29.49 kg/m   Wt Readings from Last 3 Encounters:  03/27/18 177 lb 3.2 oz (80.4 kg)  03/06/18 176 lb (79.8 kg)  01/14/18 180 lb 3.2 oz (81.7 kg)    Physical Exam  Constitutional: She is oriented to person, place, and time. She appears well-developed and well-nourished. No distress.  HENT:  Head: Atraumatic.  Eyes: Pupils are equal, round, and  reactive to light. Conjunctivae are normal.  Neck: Normal range of motion. Neck supple.  Cardiovascular: Normal rate, regular rhythm and intact distal pulses.  Pulmonary/Chest: Effort normal and breath sounds normal. No respiratory distress.  Musculoskeletal: She exhibits no edema.  Decreased ROM left shoulder  Neurological: She is alert and oriented to person, place, and time.  Skin: Skin is warm and dry. No rash noted. No erythema.  Psychiatric: She has a normal mood and affect. Her behavior is normal.  Nursing note and vitals reviewed.   Results for orders placed or performed in visit on 03/27/18  Sed Rate (ESR)  Result Value Ref Range   Sed Rate 62 (H) 0 - 40 mm/hr  Basic Metabolic Panel (BMET)  Result Value Ref Range   Glucose 316 (H) 65 - 99 mg/dL   BUN 17 8 - 27 mg/dL   Creatinine, Ser 0.99 0.57 - 1.00 mg/dL   GFR calc non Af Amer 57 (L) >59 mL/min/1.73   GFR calc Af Amer 66 >59 mL/min/1.73   BUN/Creatinine Ratio 17 12 - 28   Sodium 138 134 - 144 mmol/L   Potassium 4.1 3.5 - 5.2 mmol/L   Chloride 102 96 - 106 mmol/L   CO2 22 20 - 29 mmol/L   Calcium 9.5 8.7 - 10.3 mg/dL  [facility]  mtn spotted fvr abs pnl(IgG+IgM)  Result Value Ref Range   RMSF IgG Positive (A) Negative   RMSF IgM 0.66 0.00 - 0.89 index  Lyme Ab/Western Blot Reflex  Result Value Ref Range   Lyme IgG/IgM Ab <0.91 0.00 - 0.90 ISR   LYME DISEASE AB, QUANT, IGM <0.80 0.00 - 0.79 index  RMSF, IgG, IFA  Result Value Ref Range   RMSF, IGG, IFA 1:64 (H) Neg <1:64      Assessment & Plan:   Problem List Items Addressed This Visit    None    Visit Diagnoses    Polyarthralgia    -  Primary   Relevant Medications   triamcinolone acetonide (KENALOG-40) injection 40 mg (Completed)   Other Relevant Orders   Sed Rate (ESR) (Completed)   Basic Metabolic Panel (BMET) (Completed)   Rocky mtn spotted fvr abs pnl(IgG+IgM) (Completed)   Lyme Ab/Western Blot Reflex (Completed)   RMSF, IgG, IFA (Completed)    Will  check ESR and tick titers given new onset arthralgias.  IM kenalog given today, and discussed lidocaine patches, heating pads, continued tylenol use. Await lab results.   Follow up plan: Return for as scheduled.

## 2018-03-27 NOTE — Telephone Encounter (Signed)
Rx sent to pharmacy   

## 2018-03-28 ENCOUNTER — Telehealth: Payer: Self-pay | Admitting: Family Medicine

## 2018-03-28 MED ORDER — PREDNISONE 20 MG PO TABS: 20.0000 mg | ORAL_TABLET | Freq: Every day | ORAL | 0 refills | Status: DC

## 2018-03-28 NOTE — Telephone Encounter (Signed)
Called pt to discuss lab results. Awaiting tick titers but discussed elevated ESR - accompanied with sxs concern for PMR. Pt reports some early improvement with the kenalog. Will send in 1 week of low dose prednisone and call early next week to check in and discuss next steps

## 2018-03-29 ENCOUNTER — Ambulatory Visit: Payer: Self-pay | Admitting: *Deleted

## 2018-03-29 LAB — BASIC METABOLIC PANEL
BUN/Creatinine Ratio: 17 (ref 12–28)
BUN: 17 mg/dL (ref 8–27)
CO2: 22 mmol/L (ref 20–29)
Calcium: 9.5 mg/dL (ref 8.7–10.3)
Chloride: 102 mmol/L (ref 96–106)
Creatinine, Ser: 0.99 mg/dL (ref 0.57–1.00)
GFR, EST AFRICAN AMERICAN: 66 mL/min/{1.73_m2} (ref >59)
GFR, EST NON AFRICAN AMERICAN: 57 mL/min/{1.73_m2} — AB (ref >59)
Glucose: 316 mg/dL — ABNORMAL HIGH (ref 65–99)
POTASSIUM: 4.1 mmol/L (ref 3.5–5.2)
SODIUM: 138 mmol/L (ref 134–144)

## 2018-03-29 LAB — ROCKY MTN SPOTTED FVR ABS PNL(IGG+IGM)
RMSF IgG: POSITIVE — AB
RMSF IgM: 0.66 index (ref 0.00–0.89)

## 2018-03-29 LAB — LYME AB/WESTERN BLOT REFLEX
LYME DISEASE AB, QUANT, IGM: <0.8 index (ref 0.00–0.79)
Lyme IgG/IgM Ab: <0.91 {ISR} (ref 0.00–0.90)

## 2018-03-29 LAB — RMSF, IGG, IFA

## 2018-03-29 LAB — SEDIMENTATION RATE: Sed Rate: 62 mm/hr — ABNORMAL HIGH (ref 0–40)

## 2018-03-29 NOTE — Telephone Encounter (Signed)
Returned call to patient and left message per request. That she can take Tylenol with the prednisone but not the Aleve. The Aleve can cause more increase side effects from the prednisone. Please call back for any questions or concerns

## 2018-03-30 NOTE — Patient Instructions (Signed)
Follow up as scheduled.  

## 2018-04-01 DIAGNOSIS — M25462 Effusion, left knee: Secondary | ICD-10-CM | POA: Diagnosis not present

## 2018-04-01 DIAGNOSIS — M11262 Other chondrocalcinosis, left knee: Secondary | ICD-10-CM | POA: Diagnosis not present

## 2018-04-02 ENCOUNTER — Telehealth: Payer: Self-pay | Admitting: Family Medicine

## 2018-04-02 NOTE — Telephone Encounter (Signed)
Copied from [place] [phone]. Topic: Quick Communication - See Telephone Encounter >> Apr 02, 2018 12:41 PM Vernona Rieger wrote: CRM for notification. See Telephone encounter for: 04/02/18.   Patient said she missed a call from the office today, she thinks its from lab work she had on 5/1 Please call patient back @ [phone]

## 2018-04-02 NOTE — Telephone Encounter (Signed)
Noted. Will call back when we do afternoon phone calls. Please refer to Lab results.

## 2018-04-03 ENCOUNTER — Other Ambulatory Visit: Payer: Self-pay

## 2018-04-03 ENCOUNTER — Encounter: Payer: Self-pay | Admitting: Family Medicine

## 2018-04-03 ENCOUNTER — Ambulatory Visit (INDEPENDENT_AMBULATORY_CARE_PROVIDER_SITE_OTHER): Payer: Medicare Other | Admitting: Family Medicine

## 2018-04-03 ENCOUNTER — Ambulatory Visit: Payer: Medicare Other | Admitting: Family Medicine

## 2018-04-03 DIAGNOSIS — F413 Other mixed anxiety disorders: Secondary | ICD-10-CM

## 2018-04-03 DIAGNOSIS — A770 Spotted fever due to Rickettsia rickettsii: Secondary | ICD-10-CM

## 2018-04-03 MED ORDER — DOXYCYCLINE HYCLATE 100 MG PO TABS: 100.0000 mg | ORAL_TABLET | Freq: Two times a day (BID) | ORAL | 0 refills | Status: DC

## 2018-04-03 NOTE — Progress Notes (Signed)
BP 121/76   Pulse 66   Ht 5' 5"  (1.651 m)   Wt 175 lb 4.8 oz (79.5 kg)   SpO2 98%   BMI 29.17 kg/m    Subjective:    Patient ID: Laura Rojas, female    DOB: 1946/07/07, 72 y.o.   MRN: [id number]  HPI: Laura Rojas is a 72 y.o. female  Chief Complaint  Patient presents with  . Follow-up  On prednisone now actually feeling great.  Taking 20 mg of prednisone a day.  Was feeling really rough to the point of concerned about tickborne illness and lab work was done indicating positive for RMSF.  Sedimentation rate was also elevated.  Relevant past medical, surgical, family and social history reviewed and updated as indicated. Interim medical history since our last visit reviewed. Allergies and medications reviewed and updated.  Review of Systems  Constitutional: Negative.   Respiratory: Negative.   Cardiovascular: Negative.     Per HPI unless specifically indicated above     Objective:    BP 121/76   Pulse 66   Ht 5' 5"  (1.651 m)   Wt 175 lb 4.8 oz (79.5 kg)   SpO2 98%   BMI 29.17 kg/m   Wt Readings from Last 3 Encounters:  04/03/18 175 lb 4.8 oz (79.5 kg)  03/27/18 177 lb 3.2 oz (80.4 kg)  03/06/18 176 lb (79.8 kg)    Physical Exam  Constitutional: She is oriented to person, place, and time. She appears well-developed and well-nourished.  HENT:  Head: Normocephalic and atraumatic.  Eyes: Conjunctivae and EOM are normal.  Neck: Normal range of motion.  Cardiovascular: Normal rate, regular rhythm and normal heart sounds.  Pulmonary/Chest: Effort normal and breath sounds normal.  Musculoskeletal: Normal range of motion.  Neurological: She is alert and oriented to person, place, and time.  Skin: No erythema.  Psychiatric: She has a normal mood and affect. Her behavior is normal. Judgment and thought content normal.    Results for orders placed or performed in visit on 03/27/18  Sed Rate (ESR)  Result Value Ref Range   Sed Rate 62 (H) 0 - 40 mm/hr  Basic  Metabolic Panel (BMET)  Result Value Ref Range   Glucose 316 (H) 65 - 99 mg/dL   BUN 17 8 - 27 mg/dL   Creatinine, Ser 0.99 0.57 - 1.00 mg/dL   GFR calc non Af Amer 57 (L) >59 mL/min/1.73   GFR calc Af Amer 66 >59 mL/min/1.73   BUN/Creatinine Ratio 17 12 - 28   Sodium 138 134 - 144 mmol/L   Potassium 4.1 3.5 - 5.2 mmol/L   Chloride 102 96 - 106 mmol/L   CO2 22 20 - 29 mmol/L   Calcium 9.5 8.7 - 10.3 mg/dL  Rocky mtn spotted fvr abs pnl(IgG+IgM)  Result Value Ref Range   RMSF IgG Positive (A) Negative   RMSF IgM 0.66 0.00 - 0.89 index  Lyme Ab/Western Blot Reflex  Result Value Ref Range   Lyme IgG/IgM Ab <0.91 0.00 - 0.90 ISR   LYME DISEASE AB, QUANT, IGM <0.80 0.00 - 0.79 index  RMSF, IgG, IFA  Result Value Ref Range   RMSF, IGG, IFA 1:64 (H) Neg <1:64      Assessment & Plan:   Problem List Items Addressed This Visit      Other   Anxiety disorder    Discussed anxiety care and treatment patient's been going to psychiatry taking sertraline 25 mg is interested not  going to psychiatry any further.  Discussed with patient will be glad to continue sertraline as needed.      RMSF [facility] spotted fever)    Discuss RMSF with patient possibly causing patient's inflammation and elevated sed rate.  May be contributing to her arthralgias especially of her shoulder and hands.  Will treat with doxycycline 100 mg twice daily for 2 weeks and if not better are continued symptoms we will consider rheumatology referral.          Follow up plan: Return for As scheduled.

## 2018-04-03 NOTE — Assessment & Plan Note (Signed)
Discussed anxiety care and treatment patient's been going to psychiatry taking sertraline 25 mg is interested not going to psychiatry any further.  Discussed with patient will be glad to continue sertraline as needed.

## 2018-04-03 NOTE — Assessment & Plan Note (Signed)
Discuss RMSF with patient possibly causing patient's inflammation and elevated sed rate.  May be contributing to her arthralgias especially of her shoulder and hands.  Will treat with doxycycline 100 mg twice daily for 2 weeks and if not better are continued symptoms we will consider rheumatology referral.

## 2018-04-05 ENCOUNTER — Ambulatory Visit: Payer: Medicare Other | Admitting: Internal Medicine

## 2018-05-01 ENCOUNTER — Ambulatory Visit (INDEPENDENT_AMBULATORY_CARE_PROVIDER_SITE_OTHER): Payer: Medicare Other | Admitting: Family Medicine

## 2018-05-01 ENCOUNTER — Encounter: Payer: Self-pay | Admitting: Family Medicine

## 2018-05-01 VITALS — BP 146/87 | HR 65 | Wt 175.0 lb

## 2018-05-01 DIAGNOSIS — A770 Spotted fever due to Rickettsia rickettsii: Secondary | ICD-10-CM | POA: Diagnosis not present

## 2018-05-01 DIAGNOSIS — M25561 Pain in right knee: Secondary | ICD-10-CM

## 2018-05-01 DIAGNOSIS — M25551 Pain in right hip: Secondary | ICD-10-CM | POA: Diagnosis not present

## 2018-05-01 DIAGNOSIS — G8929 Other chronic pain: Secondary | ICD-10-CM | POA: Insufficient documentation

## 2018-05-01 DIAGNOSIS — M25562 Pain in left knee: Secondary | ICD-10-CM | POA: Diagnosis not present

## 2018-05-01 DIAGNOSIS — M25552 Pain in left hip: Secondary | ICD-10-CM | POA: Diagnosis not present

## 2018-05-01 MED ORDER — DOXYCYCLINE HYCLATE 100 MG PO TABS: 100.0000 mg | ORAL_TABLET | Freq: Two times a day (BID) | ORAL | 0 refills | Status: DC

## 2018-05-01 NOTE — Assessment & Plan Note (Signed)
Is not chronic arthritis of knees and hips but shoulders and hands. Will recheck sedimentation rate see if elevated will do a trial of doxycycline to see if related to possibility of RMSF. Will refer to rheumatology

## 2018-05-01 NOTE — Progress Notes (Signed)
BP (!) 146/87   Pulse 65   Wt 175 lb (79.4 kg)   SpO2 98%   BMI 29.12 kg/m    Subjective:    Patient ID: Laura Rojas, female    DOB: [birth year], 72 y.o.   MRN: [id number]  HPI: Laura Rojas is a 72 y.o. female  Chief Complaint  Patient presents with  . Arm Pain    Bilateral  Patient with complaints of bad arm shoulder pain the point where she could not clap her hands at her grandsons ballgame.  Has marked difficulty getting started in the morning and have been very stiff shoulder stiff. Patient took doxycycline without problems or sunburning. Reviewed labs patient's tickborne illness labs were positive and sedimentation rate was markedly elevated.  Relevant past medical, surgical, family and social history reviewed and updated as indicated. Interim medical history since our last visit reviewed. Allergies and medications reviewed and updated.  Review of Systems  Constitutional: Negative.   Respiratory: Negative.   Cardiovascular: Negative.     Per HPI unless specifically indicated above     Objective:    BP (!) 146/87   Pulse 65   Wt 175 lb (79.4 kg)   SpO2 98%   BMI 29.12 kg/m   Wt Readings from Last 3 Encounters:  05/01/18 175 lb (79.4 kg)  04/03/18 175 lb 4.8 oz (79.5 kg)  03/27/18 177 lb 3.2 oz (80.4 kg)    Physical Exam  Constitutional: She is oriented to person, place, and time. She appears well-developed and well-nourished.  HENT:  Head: Normocephalic and atraumatic.  Eyes: Conjunctivae and EOM are normal.  Neck: Normal range of motion.  Cardiovascular: Normal rate, regular rhythm and normal heart sounds.  Pulmonary/Chest: Effort normal and breath sounds normal.  Musculoskeletal: Normal range of motion.  Neurological: She is alert and oriented to person, place, and time.  Skin: No erythema.  Psychiatric: She has a normal mood and affect. Her behavior is normal. Judgment and thought content normal.    Results for orders placed or performed in  visit on 03/27/18  Sed Rate (ESR)  Result Value Ref Range   Sed Rate 62 (H) 0 - 40 mm/hr  Basic Metabolic Panel (BMET)  Result Value Ref Range   Glucose 316 (H) 65 - 99 mg/dL   BUN 17 8 - 27 mg/dL   Creatinine, Ser 0.99 0.57 - 1.00 mg/dL   GFR calc non Af Amer 57 (L) >59 mL/min/1.73   GFR calc Af Amer 66 >59 mL/min/1.73   BUN/Creatinine Ratio 17 12 - 28   Sodium 138 134 - 144 mmol/L   Potassium 4.1 3.5 - 5.2 mmol/L   Chloride 102 96 - 106 mmol/L   CO2 22 20 - 29 mmol/L   Calcium 9.5 8.7 - 10.3 mg/dL  Rocky mtn spotted fvr abs pnl(IgG+IgM)  Result Value Ref Range   RMSF IgG Positive (A) Negative   RMSF IgM 0.66 0.00 - 0.89 index  Lyme Ab/Western Blot Reflex  Result Value Ref Range   Lyme IgG/IgM Ab <0.91 0.00 - 0.90 ISR   LYME DISEASE AB, QUANT, IGM <0.80 0.00 - 0.79 index  RMSF, IgG, IFA  Result Value Ref Range   RMSF, IGG, IFA 1:64 (H) Neg <1:64      Assessment & Plan:   Problem List Items Addressed This Visit      Other   RMSF [facility] spotted fever) - Primary    We will go ahead and treat again  with doxycycline for 3 weeks to see if that does not help patient's symptoms in the meantime.      Relevant Orders   Sed Rate (ESR)   Chronic arthralgias of knees and hips    Is not chronic arthritis of knees and hips but shoulders and hands. Will recheck sedimentation rate see if elevated will do a trial of doxycycline to see if related to possibility of RMSF. Will refer to rheumatology      Relevant Orders   Ambulatory referral to Rheumatology       Follow up plan: Return if symptoms worsen or fail to improve, for As scheduled.

## 2018-05-01 NOTE — Assessment & Plan Note (Signed)
We will go ahead and treat again with doxycycline for 3 weeks to see if that does not help patient's symptoms in the meantime.

## 2018-05-02 LAB — SEDIMENTATION RATE: Sed Rate: 42 mm/hr — ABNORMAL HIGH (ref 0–40)

## 2018-05-05 DIAGNOSIS — M25511 Pain in right shoulder: Secondary | ICD-10-CM | POA: Diagnosis not present

## 2018-05-05 DIAGNOSIS — M2550 Pain in unspecified joint: Secondary | ICD-10-CM | POA: Diagnosis not present

## 2018-05-05 DIAGNOSIS — M25512 Pain in left shoulder: Secondary | ICD-10-CM | POA: Diagnosis not present

## 2018-05-05 DIAGNOSIS — M25541 Pain in joints of right hand: Secondary | ICD-10-CM | POA: Diagnosis not present

## 2018-05-05 DIAGNOSIS — M25542 Pain in joints of left hand: Secondary | ICD-10-CM | POA: Diagnosis not present

## 2018-05-06 ENCOUNTER — Ambulatory Visit: Payer: Medicare Other | Admitting: Family Medicine

## 2018-05-07 DIAGNOSIS — M25462 Effusion, left knee: Secondary | ICD-10-CM | POA: Diagnosis not present

## 2018-05-07 DIAGNOSIS — M25612 Stiffness of left shoulder, not elsewhere classified: Secondary | ICD-10-CM | POA: Diagnosis not present

## 2018-05-07 DIAGNOSIS — M25441 Effusion, right hand: Secondary | ICD-10-CM | POA: Diagnosis not present

## 2018-05-07 DIAGNOSIS — M19031 Primary osteoarthritis, right wrist: Secondary | ICD-10-CM | POA: Diagnosis not present

## 2018-05-07 DIAGNOSIS — I10 Essential (primary) hypertension: Secondary | ICD-10-CM | POA: Diagnosis not present

## 2018-05-07 DIAGNOSIS — M25611 Stiffness of right shoulder, not elsewhere classified: Secondary | ICD-10-CM | POA: Diagnosis not present

## 2018-05-07 DIAGNOSIS — Z794 Long term (current) use of insulin: Secondary | ICD-10-CM | POA: Diagnosis not present

## 2018-05-07 DIAGNOSIS — E538 Deficiency of other specified B group vitamins: Secondary | ICD-10-CM | POA: Diagnosis not present

## 2018-05-07 DIAGNOSIS — E1165 Type 2 diabetes mellitus with hyperglycemia: Secondary | ICD-10-CM | POA: Diagnosis not present

## 2018-05-07 DIAGNOSIS — E118 Type 2 diabetes mellitus with unspecified complications: Secondary | ICD-10-CM | POA: Diagnosis not present

## 2018-05-07 DIAGNOSIS — M19041 Primary osteoarthritis, right hand: Secondary | ICD-10-CM | POA: Diagnosis not present

## 2018-05-07 DIAGNOSIS — E785 Hyperlipidemia, unspecified: Secondary | ICD-10-CM | POA: Diagnosis not present

## 2018-05-07 DIAGNOSIS — R700 Elevated erythrocyte sedimentation rate: Secondary | ICD-10-CM | POA: Diagnosis not present

## 2018-05-07 LAB — HM DIABETES FOOT EXAM: HM Diabetic Foot Exam: NORMAL

## 2018-05-07 LAB — HEMOGLOBIN A1C: Hemoglobin A1C: 9.3

## 2018-05-15 ENCOUNTER — Ambulatory Visit (INDEPENDENT_AMBULATORY_CARE_PROVIDER_SITE_OTHER): Payer: Medicare Other | Admitting: Family Medicine

## 2018-05-15 ENCOUNTER — Encounter: Payer: Self-pay | Admitting: Family Medicine

## 2018-05-15 DIAGNOSIS — M353 Polymyalgia rheumatica: Secondary | ICD-10-CM

## 2018-05-15 DIAGNOSIS — D510 Vitamin B12 deficiency anemia due to intrinsic factor deficiency: Secondary | ICD-10-CM | POA: Insufficient documentation

## 2018-05-15 DIAGNOSIS — D509 Iron deficiency anemia, unspecified: Secondary | ICD-10-CM

## 2018-05-15 DIAGNOSIS — K140 Glossitis: Secondary | ICD-10-CM | POA: Diagnosis not present

## 2018-05-15 NOTE — Progress Notes (Signed)
BP 121/72   Pulse 69   Ht 5' 4" (1.626 m)   Wt 175 lb (79.4 kg)   SpO2 97%   BMI 30.04 kg/m    Subjective:    Patient ID: Laura Rojas, female    DOB: [birth year], 72 y.o.   MRN: [id number]  HPI: Laura Rojas is a 72 y.o. female  Sore Tongue  Patient pretty much all year is been having sore tongue with tenderness irritation along the tip.  No real noticeable swelling or skin changes to her tongue.  Has tried salt water rinse and multiple vitamins to no avail. Chart review no obvious causes of tongue irritation reviewed labs and normal except for anemia which patient is taking her iron. Reviewed patient's other notes recently diagnosed as polymyalgia rheumatica which may be playing a role in her sore tongue.  Relevant past medical, surgical, family and social history reviewed and updated as indicated. Interim medical history since our last visit reviewed. Allergies and medications reviewed and updated.  Review of Systems  Constitutional: Negative.   Respiratory: Negative.   Cardiovascular: Negative.     Per HPI unless specifically indicated above     Objective:    BP 121/72   Pulse 69   Ht 5' 4" (1.626 m)   Wt 175 lb (79.4 kg)   SpO2 97%   BMI 30.04 kg/m   Wt Readings from Last 3 Encounters:  05/15/18 175 lb (79.4 kg)  05/01/18 175 lb (79.4 kg)  04/03/18 175 lb 4.8 oz (79.5 kg)    Physical Exam  Constitutional: She is oriented to person, place, and time. She appears well-developed and well-nourished.  HENT:  Head: Normocephalic and atraumatic.  Tongue with mild glossitis changes  Eyes: Conjunctivae and EOM are normal.  Neck: Normal range of motion.  Cardiovascular: Normal rate, regular rhythm and normal heart sounds.  Pulmonary/Chest: Effort normal and breath sounds normal.  Musculoskeletal: Normal range of motion.  Neurological: She is alert and oriented to person, place, and time.  Skin: No erythema.  Psychiatric: She has a normal mood and affect. Her  behavior is normal. Judgment and thought content normal.    Results for orders placed or performed in visit on 05/01/18  Sed Rate (ESR)  Result Value Ref Range   Sed Rate 42 (H) 0 - 40 mm/hr      Assessment & Plan:   Problem List Items Addressed This Visit      Digestive   Glossitis    Patient with ongoing glossitis possibly related to PMR but also possibly related to iron deficiency anemia will check iron stores and CBC again.  Also check vitamin B12      Relevant Orders   Vitamin B12     Other   Fe deficiency anemia    Checking labs      Relevant Orders   Ferritin   CBC with Differential/Platelet   Polymyalgia rheumatica (HCC)    Patient with new diagnosis of PMR.  Has not started prednisone yet reviewed prednisone risk benefits patient will continue as prescribed.  Is being managed by endocrinology for diabetes which she is aware of will get worse.      Relevant Orders   CBC with Differential/Platelet   Pernicious anemia    No formal diagnosis using this diagnosis code 2 get vitamin B12 approved for blood work to check glossitis symptoms.      Relevant Orders   Vitamin B12       Follow  up plan: Return if symptoms worsen or fail to improve, for As scheduled.

## 2018-05-15 NOTE — Assessment & Plan Note (Addendum)
Patient with new diagnosis of PMR.  Has not started prednisone yet reviewed prednisone risk benefits patient will continue as prescribed.  Is being managed by endocrinology for diabetes which she is aware of will get worse.

## 2018-05-15 NOTE — Assessment & Plan Note (Signed)
No formal diagnosis using this diagnosis code 2 get vitamin B12 approved for blood work to check glossitis symptoms.

## 2018-05-15 NOTE — Assessment & Plan Note (Signed)
Checking labs

## 2018-05-15 NOTE — Assessment & Plan Note (Signed)
Patient with ongoing glossitis possibly related to PMR but also possibly related to iron deficiency anemia will check iron stores and CBC again.  Also check vitamin B12

## 2018-05-16 ENCOUNTER — Encounter: Payer: Self-pay | Admitting: Family Medicine

## 2018-05-16 LAB — CBC WITH DIFFERENTIAL/PLATELET
BASOS ABS: 0 10*3/uL (ref 0.0–0.2)
Basos: 0 %
EOS (ABSOLUTE): 0 10*3/uL (ref 0.0–0.4)
EOS: 0 %
HEMATOCRIT: 35.7 % (ref 34.0–46.6)
Hemoglobin: 11.2 g/dL (ref 11.1–15.9)
IMMATURE GRANULOCYTES: 1 %
Immature Grans (Abs): 0.1 10*3/uL (ref 0.0–0.1)
Lymphocytes Absolute: 1.3 10*3/uL (ref 0.7–3.1)
Lymphs: 12 %
MCH: 26.4 pg — ABNORMAL LOW (ref 26.6–33.0)
MCHC: 31.4 g/dL — ABNORMAL LOW (ref 31.5–35.7)
MCV: 84 fL (ref 79–97)
MONOCYTES: 3 %
Monocytes Absolute: 0.3 10*3/uL (ref 0.1–0.9)
NEUTROS PCT: 84 %
Neutrophils Absolute: 9.1 10*3/uL — ABNORMAL HIGH (ref 1.4–7.0)
Platelets: 513 10*3/uL — ABNORMAL HIGH (ref 150–450)
RBC: 4.25 x10E6/uL (ref 3.77–5.28)
RDW: 17.2 % — AB (ref 12.3–15.4)
WBC: 10.7 10*3/uL (ref 3.4–10.8)

## 2018-05-16 LAB — FERRITIN: FERRITIN: 22 ng/mL (ref 15–150)

## 2018-05-16 LAB — VITAMIN B12: Vitamin B-12: >2000 pg/mL — ABNORMAL HIGH (ref 232–1245)

## 2018-05-19 ENCOUNTER — Other Ambulatory Visit: Payer: Self-pay | Admitting: Family Medicine

## 2018-05-21 NOTE — Telephone Encounter (Signed)
Please review for refill: Doxycycline Hyclate 100 mg tab Filled on 05/01/18, previously for 3 weeks LOV  05/15/18   Medication last addressed on 05/01/18 office visit NOV  08/28/18  Dr. Jeananne [facility]ama [facility] [phone]

## 2018-05-22 DIAGNOSIS — M19041 Primary osteoarthritis, right hand: Secondary | ICD-10-CM | POA: Diagnosis not present

## 2018-05-22 DIAGNOSIS — M11262 Other chondrocalcinosis, left knee: Secondary | ICD-10-CM | POA: Diagnosis not present

## 2018-05-22 DIAGNOSIS — M353 Polymyalgia rheumatica: Secondary | ICD-10-CM | POA: Diagnosis not present

## 2018-05-22 DIAGNOSIS — M170 Bilateral primary osteoarthritis of knee: Secondary | ICD-10-CM | POA: Diagnosis not present

## 2018-05-22 DIAGNOSIS — Z7952 Long term (current) use of systemic steroids: Secondary | ICD-10-CM | POA: Diagnosis not present

## 2018-05-22 DIAGNOSIS — M19042 Primary osteoarthritis, left hand: Secondary | ICD-10-CM | POA: Diagnosis not present

## 2018-05-25 ENCOUNTER — Other Ambulatory Visit: Payer: Self-pay | Admitting: Cardiovascular Disease

## 2018-05-25 DIAGNOSIS — I10 Essential (primary) hypertension: Secondary | ICD-10-CM

## 2018-05-27 DIAGNOSIS — Z794 Long term (current) use of insulin: Secondary | ICD-10-CM | POA: Diagnosis not present

## 2018-05-27 DIAGNOSIS — E785 Hyperlipidemia, unspecified: Secondary | ICD-10-CM | POA: Diagnosis not present

## 2018-05-27 DIAGNOSIS — E1165 Type 2 diabetes mellitus with hyperglycemia: Secondary | ICD-10-CM | POA: Diagnosis not present

## 2018-05-27 DIAGNOSIS — I10 Essential (primary) hypertension: Secondary | ICD-10-CM | POA: Diagnosis not present

## 2018-05-27 NOTE — Telephone Encounter (Signed)
Rx(s) sent to pharmacy electronically.  

## 2018-06-07 DIAGNOSIS — K140 Glossitis: Secondary | ICD-10-CM | POA: Diagnosis not present

## 2018-06-19 DIAGNOSIS — M353 Polymyalgia rheumatica: Secondary | ICD-10-CM | POA: Diagnosis not present

## 2018-06-19 DIAGNOSIS — Z7952 Long term (current) use of systemic steroids: Secondary | ICD-10-CM | POA: Diagnosis not present

## 2018-06-30 ENCOUNTER — Other Ambulatory Visit: Payer: Self-pay | Admitting: Cardiovascular Disease

## 2018-06-30 DIAGNOSIS — I10 Essential (primary) hypertension: Secondary | ICD-10-CM

## 2018-07-08 ENCOUNTER — Other Ambulatory Visit: Payer: Self-pay | Admitting: Family Medicine

## 2018-07-08 MED ORDER — LORAZEPAM 0.5 MG PO TABS: 0.5000 mg | ORAL_TABLET | Freq: Every day | ORAL | 1 refills | Status: DC

## 2018-07-08 NOTE — Telephone Encounter (Signed)
Refill of Ativan  LOV 05/15/18 Dr. Jeananne Rama  [facility] 01/09/18  #90  1 refill  [id number] - [place], [place] - [address]      [phone] (Phone) [phone] (Fax)

## 2018-07-08 NOTE — Telephone Encounter (Signed)
Copied from [place] [id number]. Topic: Quick Communication - Rx Refill/Question >> Jul 08, 2018  9:10 AM Laura Rojas wrote: Medication: LORazepam (ATIVAN) 0.5 MG tablet   Pt called and stated that she would be going on vacation starting [phone] and would like to know if it is possible to get a refill before she leaves without coming in. Please advise.   Preferred Pharmacy (with phone number or street name): [id number] - [place], [place] [phone] (Phone) [phone] (Fax)    Agent: Please be advised that RX refills may take up to 3 business days. We ask that you follow-up with your pharmacy.

## 2018-07-10 ENCOUNTER — Encounter: Payer: Self-pay | Admitting: Family Medicine

## 2018-07-10 DIAGNOSIS — E119 Type 2 diabetes mellitus without complications: Secondary | ICD-10-CM | POA: Diagnosis not present

## 2018-07-10 LAB — HM DIABETES EYE EXAM

## 2018-07-23 DIAGNOSIS — E1159 Type 2 diabetes mellitus with other circulatory complications: Secondary | ICD-10-CM | POA: Diagnosis not present

## 2018-07-23 DIAGNOSIS — M25441 Effusion, right hand: Secondary | ICD-10-CM | POA: Diagnosis not present

## 2018-07-23 DIAGNOSIS — M11262 Other chondrocalcinosis, left knee: Secondary | ICD-10-CM | POA: Diagnosis not present

## 2018-07-23 DIAGNOSIS — E1169 Type 2 diabetes mellitus with other specified complication: Secondary | ICD-10-CM | POA: Diagnosis not present

## 2018-07-23 DIAGNOSIS — Z794 Long term (current) use of insulin: Secondary | ICD-10-CM | POA: Diagnosis not present

## 2018-07-23 DIAGNOSIS — E1165 Type 2 diabetes mellitus with hyperglycemia: Secondary | ICD-10-CM | POA: Diagnosis not present

## 2018-07-23 DIAGNOSIS — Z8349 Family history of other endocrine, nutritional and metabolic diseases: Secondary | ICD-10-CM | POA: Diagnosis not present

## 2018-07-23 DIAGNOSIS — M170 Bilateral primary osteoarthritis of knee: Secondary | ICD-10-CM | POA: Diagnosis not present

## 2018-07-23 DIAGNOSIS — I10 Essential (primary) hypertension: Secondary | ICD-10-CM | POA: Diagnosis not present

## 2018-07-23 DIAGNOSIS — M353 Polymyalgia rheumatica: Secondary | ICD-10-CM | POA: Diagnosis not present

## 2018-07-23 DIAGNOSIS — E785 Hyperlipidemia, unspecified: Secondary | ICD-10-CM | POA: Diagnosis not present

## 2018-07-30 ENCOUNTER — Other Ambulatory Visit: Payer: Self-pay | Admitting: Family Medicine

## 2018-07-30 DIAGNOSIS — Z1231 Encounter for screening mammogram for malignant neoplasm of breast: Secondary | ICD-10-CM

## 2018-08-06 ENCOUNTER — Encounter: Payer: Self-pay | Admitting: Cardiovascular Disease

## 2018-08-06 ENCOUNTER — Ambulatory Visit (INDEPENDENT_AMBULATORY_CARE_PROVIDER_SITE_OTHER): Payer: Medicare Other | Admitting: Cardiovascular Disease

## 2018-08-06 VITALS — BP 122/74 | HR 51 | Ht 65.0 in | Wt 180.4 lb

## 2018-08-06 DIAGNOSIS — I25708 Atherosclerosis of coronary artery bypass graft(s), unspecified, with other forms of angina pectoris: Secondary | ICD-10-CM | POA: Diagnosis not present

## 2018-08-06 DIAGNOSIS — E782 Mixed hyperlipidemia: Secondary | ICD-10-CM

## 2018-08-06 DIAGNOSIS — I10 Essential (primary) hypertension: Secondary | ICD-10-CM | POA: Diagnosis not present

## 2018-08-06 DIAGNOSIS — I739 Peripheral vascular disease, unspecified: Secondary | ICD-10-CM | POA: Diagnosis not present

## 2018-08-06 DIAGNOSIS — E1159 Type 2 diabetes mellitus with other circulatory complications: Secondary | ICD-10-CM

## 2018-08-06 DIAGNOSIS — Z794 Long term (current) use of insulin: Secondary | ICD-10-CM

## 2018-08-06 NOTE — Patient Instructions (Signed)
Dr Croitoru recommends that you schedule a follow-up appointment in 12 months. You will receive a reminder letter in the mail two months in advance. If you don't receive a letter, please call our office to schedule the follow-up appointment.  If you need a refill on your cardiac medications before your next appointment, please call your pharmacy. 

## 2018-08-06 NOTE — Progress Notes (Signed)
Patient ID: Laura Rojas, female   DOB: 30-Apr-1946, 72 y.o.   MRN: [id number]    Cardiology Office Note    Date:  08/07/2018   ID:  Laura Rojas, Laura Rojas 11-10-1946, MRN [id number]  PCP:  Guadalupe Maple, MD  Cardiologist:   Sanda Klein, MD   Chief complaint: Weakness and fatigue   History of Present Illness:  Laura Rojas is a 72 y.o. female with coronary disease, PAD, DM, HTN and hyperlipidemia.  She has not had any cardiac issues in the last year, but has been limited by rheumatological complaints.  At one point she could not lift her arms above shoulder level.  She was diagnosed with polymyalgia rheumatica and has been treated prednisone by Dr. Meda Coffee at the [facility] in [place].  She had immediate response to prednisone, but as she is weaning the dose down from 20 mg to 10 mg she is noticing some recurrent aches and pains.  She does complain of a little bit of fatigue and despite cutting back her beta-blocker dose last year she is still bradycardic with a heart rate of 51.  Her blood pressure is well controlled.  The patient specifically denies any chest pain at rest or exertion, dyspnea at rest or with exertion, orthopnea, paroxysmal nocturnal dyspnea, syncope, palpitations, focal neurological deficits, intermittent claudication, lower extremity edema, unexplained weight gain, cough, hemoptysis or wheezing.  She had stents placed in the proximal LAD and proximal RCA (2007, 3.0 x 28 mm drug-eluting Cypher stent to RCA, 3.5 x 33 mm drug-eluting Cypher stent and proximal LAD). She is known to have an untreated stenosis in the very distal portion of the LAD and a 60% "jailed" ostial diagonal stenosis at the level of the proximal LAD stent. She has preserved LV systolic function and has never had congestive heart failure. Her most recent functional study was a nuclear stress test in 2013 that showed normal perfusion and normal LVEF. Her most recent cardiac catheterization was  performed in 2009 and both the LAD and RCA stents were found to be widely patent. Significant comorbidities include diabetes mellitus type 2, hyperlipidemia and hypertension.    Past Medical History:  Diagnosis Date  . CAD (coronary artery disease)   . CAD (coronary artery disease) 12/31/2013   2007, 3.0 x 28 mm drug-eluting Cypher stent to RCA, 3.5 x 33 mm drug-eluting Cypher stent and proximal LAD) Consent by 2009 Normal perfusion by mildly 2013   . Dyslipidemia   . Subclavian artery stenosis, left ([place]) 01/01/2015    Past Surgical History:  Procedure Laterality Date  . BREAST BIOPSY Right 1970   EXCISIONAL - NEG  . BREAST BIOPSY Left 1973   EXCISIONAL - NEG  . CARDIAC CATHETERIZATION  08/25/2008   patent stents  . CAROTID DUPLEX  10/2006   NORMAL CAROTID ARTERY DUPLEX  . CORONARY ANGIOPLASTY WITH STENT PLACEMENT  09/17/2006   PTCA & stenting LAD  . ESOPHAGOGASTRODUODENOSCOPY N/A 04/27/2015   Procedure: ESOPHAGOGASTRODUODENOSCOPY (EGD);  Surgeon: Lucilla Lame, MD;  Location: [facility] ENDOSCOPY;  Service: Endoscopy;  Laterality: N/A;  . NM MYOCAR MULTIPLE W/SPECT  11/2011   EF 72%.NORMAL MYOCARDIAL PERFUSION STUDY  . REPLACEMENT TOTAL KNEE  2003 & 2004  . TRANSTHORACIC ECHOCARDIOGRAM  09/2006   EF =>55%. IMPAIRED LV RELAXATION. MILD MITRAL ANNULAR CALCIFICATION. AV-MILDLY SCLEROTIC. NO AS.  Marland Kitchen VAGINAL HYSTERECTOMY  1983    Current Medications: Outpatient Medications Prior to Visit  Medication Sig Dispense Refill  . amLODipine (NORVASC) 2.5 MG  tablet Take 1 tablet (2.5 mg total) by mouth daily. Please call to make appointment for further refills 90 tablet 0  . aspirin 81 MG tablet Take 81 mg by mouth daily.    . betamethasone dipropionate (DIPROLENE) 0.05 % cream Apply topically 2 (two) times daily. 45 g 0  . carvedilol (COREG) 6.25 MG tablet Take 1 tablet (6.25 mg total) by mouth 2 (two) times daily. KEEP OV. 60 tablet 1  . clopidogrel (PLAVIX) 75 MG tablet Take 1 tablet (75 mg total)  by mouth daily. 90 tablet 4  . colchicine 0.6 MG tablet Take 1 tablet (0.6 mg total) by mouth daily. 30 tablet 3  . doxycycline (VIBRA-TABS) 100 MG tablet Take 1 tablet (100 mg total) by mouth 2 (two) times daily. 42 tablet 0  . ferrous sulfate 325 (65 FE) MG tablet Take 325 mg by mouth daily with breakfast.    . hydrochlorothiazide (MICROZIDE) 12.5 MG capsule Take 1 capsule (12.5 mg total) by mouth daily. 90 capsule 4  . hydrOXYzine (ATARAX/VISTARIL) 25 MG tablet Take 1 tablet (25 mg total) by mouth 3 (three) times daily as needed. 90 tablet 4  . Insulin Glargine (LANTUS SOLOSTAR) 100 UNIT/ML Solostar Pen Inject into the skin.    Marland Kitchen LORazepam (ATIVAN) 0.5 MG tablet Take 1 tablet (0.5 mg total) by mouth at bedtime. 90 tablet 1  . losartan (COZAAR) 100 MG tablet Take 1 tablet (100 mg total) by mouth daily. 90 tablet 4  . Magnesium 400 MG TABS Take 1 tablet by mouth daily.    . Magnesium Gluconate 550 MG TABS Take 30 mg by mouth daily.    . metFORMIN (GLUCOPHAGE) 1000 MG tablet Take 500 mg by mouth 2 (two) times daily.     Marland Kitchen NOVOLOG FLEXPEN 100 UNIT/ML FlexPen 6 UNITS WITH BREAKFAST, 10 UNITS WITH LUNCH AND 10 UNITS WITH DINNER - MAX 30 UNITS DAILY  3  . Omega-3 Fatty Acids (FISH OIL) 1200 MG CAPS Take 1,200 mg by mouth daily.    . pantoprazole (PROTONIX) 40 MG tablet Take 1 tablet (40 mg total) by mouth daily. 90 tablet 4  . predniSONE (DELTASONE) 20 MG tablet Take 20 mg by mouth daily.  0  . sertraline (ZOLOFT) 50 MG tablet Take 25 mg by mouth daily.    . simvastatin (ZOCOR) 40 MG tablet Take 1 tablet (40 mg total) by mouth daily at 6 PM. 90 tablet 4  . triamcinolone cream (KENALOG) 0.1 % Apply 1 application topically 2 (two) times daily. 30 g 0  . insulin aspart (NOVOLOG) 100 UNIT/ML injection Inject 6 Units into the skin 3 (three) times daily before meals.     No facility-administered medications prior to visit.      Allergies:   Compazine [prochlorperazine edisylate]; Altace [ramipril];  Crestor [rosuvastatin calcium]; Reglan [metoclopramide]; Sulfa antibiotics; Actos [pioglitazone]; Amlodipine; Latex; and Lipitor [atorvastatin]   Social History   Socioeconomic History  . Marital status: Married    Spouse name: Not on file  . Number of children: Not on file  . Years of education: Not on file  . Highest education level: Not on file  Occupational History  . Not on file  Social Needs  . Financial resource strain: Not on file  . Food insecurity:    Worry: Not on file    Inability: Not on file  . Transportation needs:    Medical: Not on file    Non-medical: Not on file  Tobacco Use  . Smoking status:  Never Smoker  . Smokeless tobacco: Never Used  Substance and Sexual Activity  . Alcohol use: No    Alcohol/week: 0.0 standard drinks  . Drug use: No  . Sexual activity: Not on file  Lifestyle  . Physical activity:    Days per week: Not on file    Minutes per session: Not on file  . Stress: Not on file  Relationships  . Social connections:    Talks on phone: Not on file    Gets together: Not on file    Attends religious service: Not on file    Active member of club or organization: Not on file    Attends meetings of clubs or organizations: Not on file    Relationship status: Not on file  Other Topics Concern  . Not on file  Social History Narrative  . Not on file     Family History:  The patient's family history includes Brain cancer in her grandchild; Breast cancer (age of onset: 70) in her mother; Diabetes in her sister; Heart disease in her maternal grandfather and maternal grandmother.   ROS:   Please see the history of present illness.    ROS all other systems are reviewed and are negative  PHYSICAL EXAM:   VS:  BP 122/74   Pulse (!) 51   Ht 5\' 5"  (1.651 m)   Wt 180 lb 6.4 oz (81.8 kg)   BMI 30.02 kg/m     General: Alert, oriented x3, no distress, borderline obese Head: no evidence of trauma, PERRL, EOMI, no exophtalmos or lid lag, no  myxedema, no xanthelasma; normal ears, nose and oropharynx Neck: normal jugular venous pulsations and no hepatojugular reflux; brisk carotid pulses without delay and no carotid bruits Chest: clear to auscultation, no signs of consolidation by percussion or palpation, normal fremitus, symmetrical and full respiratory excursions Cardiovascular: normal position and quality of the apical impulse, regular rhythm, normal first and second heart sounds, no murmurs, rubs or gallops Abdomen: no tenderness or distention, no masses by palpation, no abnormal pulsatility or arterial bruits, normal bowel sounds, no hepatosplenomegaly Extremities: no clubbing, cyanosis or edema; 2+ radial, ulnar and brachial pulses bilaterally; 2+ right femoral, posterior tibial and dorsalis pedis pulses; 2+ left femoral, posterior tibial and dorsalis pedis pulses; no subclavian or femoral bruits Neurological: grossly nonfocal Psych: Normal mood and affect   Wt Readings from Last 3 Encounters:  08/06/18 180 lb 6.4 oz (81.8 kg)  05/15/18 175 lb (79.4 kg)  05/01/18 175 lb (79.4 kg)      Studies/Labs Reviewed:   EKG:  EKG is ordered today.  Shows sinus bradycardia with flat anterior T waves, prolonged QTC 518 ms (hard to measure accurately since the end of the T wave is hard to see..  Not really changed from the previous tracing  Recent Labs: 08/23/2017: ALT 25; TSH 2.810 03/27/2018: BUN 17; Creatinine, Ser 0.99; Potassium 4.1; Sodium 138 05/15/2018: Hemoglobin 11.2; Platelets 513   Lipid Panel    Component Value Date/Time   CHOL 133 08/23/2017 0906   CHOL 157 01/23/2017 1057   TRIG 291 (H) 08/23/2017 0906   TRIG 252 (H) 01/23/2017 1057   HDL 38 (L) 08/23/2017 0906   CHOLHDL 3.5 08/23/2017 0906   VLDL 50 (H) 01/23/2017 1057   LDLCALC 37 08/23/2017 0906  01/23/2017 Calculated LDL 59 HDL 48  ASSESSMENT:    1. Coronary artery disease of bypass graft of native heart with stable angina pectoris ([place])   2. Essential  hypertension   3. Type 2 diabetes mellitus with other circulatory complication, with long-term current use of insulin ([place])   4. Mixed hyperlipidemia   5. PAD (peripheral artery disease) ([facility])      PLAN:  In order of problems listed above:  1. CAD: She does not have angina pectoris.  She is on 2 antianginals carvedilol and amlodipine. 2. HTN: Excellent control.  On current medications does not have a lot of problems with leg edema and denies dyspnea. 3. DM: followed in the endocrinology clinic at [facility].  4. HLP: On statin, LDL cholesterol well within desirable range. 5. PAD: There was no evidence of significant carotid or subclavian stenosis on duplex ultrasonography performed in 2016 there was antegrade flow in the left vertebral artery. Her blood pressure should always be checked on the right side (roughly 15 mmHg gradient compared to left).    Medication Adjustments/Labs and Tests Ordered: Current medicines are reviewed at length with the patient today.  Concerns regarding medicines are outlined above.  Medication changes, Labs and Tests ordered today are listed in the Patient Instructions below. Patient Instructions  Dr Sallyanne Kuster recommends that you schedule a follow-up appointment in 12 months. You will receive a reminder letter in the mail two months in advance. If you don't receive a letter, please call our office to schedule the follow-up appointment.  If you need a refill on your cardiac medications before your next appointment, please call your pharmacy.    Signed, Sanda Klein, MD  08/07/2018 2:07 PM    [place] [place], [place], [place]  [address] Phone: [phone]; Fax: [phone]

## 2018-08-08 DIAGNOSIS — R768 Other specified abnormal immunological findings in serum: Secondary | ICD-10-CM | POA: Diagnosis not present

## 2018-08-08 DIAGNOSIS — Z96653 Presence of artificial knee joint, bilateral: Secondary | ICD-10-CM | POA: Diagnosis not present

## 2018-08-08 DIAGNOSIS — Z7952 Long term (current) use of systemic steroids: Secondary | ICD-10-CM | POA: Diagnosis not present

## 2018-08-08 DIAGNOSIS — M353 Polymyalgia rheumatica: Secondary | ICD-10-CM | POA: Diagnosis not present

## 2018-08-15 ENCOUNTER — Ambulatory Visit
Admission: RE | Admit: 2018-08-15 | Discharge: 2018-08-15 | Disposition: A | Discharge status: Home / Self Care | Payer: Medicare Other | Source: Ambulatory Visit | Attending: Family Medicine | Admitting: Family Medicine

## 2018-08-15 DIAGNOSIS — Z1231 Encounter for screening mammogram for malignant neoplasm of breast: Secondary | ICD-10-CM | POA: Insufficient documentation

## 2018-08-21 ENCOUNTER — Ambulatory Visit (INDEPENDENT_AMBULATORY_CARE_PROVIDER_SITE_OTHER): Payer: Medicare Other

## 2018-08-21 VITALS — BP 122/66 | HR 64 | Temp 97.8°F | Resp 16 | Ht 62.0 in | Wt 176.3 lb

## 2018-08-21 DIAGNOSIS — Z0000 Encounter for general adult medical examination without abnormal findings: Secondary | ICD-10-CM | POA: Diagnosis not present

## 2018-08-21 NOTE — Patient Instructions (Addendum)
Laura Rojas , Thank you for taking time to come for your Medicare Wellness Visit. I appreciate your ongoing commitment to your health goals. Please review the following plan we discussed and let me know if I can assist you in the future.   Screening recommendations/referrals: Colonoscopy: completed 12/17/2008 Mammogram: completed 08/15/2018 Bone Density: completed 05/13/2014 Recommended yearly ophthalmology/optometry visit for glaucoma screening and checkup Recommended yearly dental visit for hygiene and checkup  Vaccinations: Influenza vaccine: due now - declined today Pneumococcal vaccine: completed series  Tetanus/TD/TDAP vaccine: completed 12/21/2009 Shingles vaccine: shingrix eligible, check with your insurance company for coverage   Advanced directives: Advance directive discussed with you today. I have provided a copy for you to complete at home and have notarized. Once this is complete please bring a copy in to our office so we can scan it into your chart.  Conditions/risks identified: Recommend drinking at least 6-8 glasses of water a day   Next appointment: Follow up on 08/28/2018 at 9:00am with [facility]. Follow up in one year for your annual wellenss exam.    Preventive Care 65 Years and Older, Female Preventive care refers to lifestyle choices and visits with your health care provider that can promote health and wellness. What does preventive care include?  A yearly physical exam. This is also called an annual well check.  Dental exams once or twice a year.  Routine eye exams. Ask your health care provider how often you should have your eyes checked.  Personal lifestyle choices, including:  Daily care of your teeth and gums.  Regular physical activity.  Eating a healthy diet.  Avoiding tobacco and drug use.  Limiting alcohol use.  Practicing safe sex.  Taking low-dose aspirin every day.  Taking vitamin and mineral supplements as recommended by your health care  provider. What happens during an annual well check? The services and screenings done by your health care provider during your annual well check will depend on your age, overall health, lifestyle risk factors, and family history of disease. Counseling  Your health care provider may ask you questions about your:  Alcohol use.  Tobacco use.  Drug use.  Emotional well-being.  Home and relationship well-being.  Sexual activity.  Eating habits.  History of falls.  Memory and ability to understand (cognition).  Work and work Statistician.  Reproductive health. Screening  You may have the following tests or measurements:  Height, weight, and BMI.  Blood pressure.  Lipid and cholesterol levels. These may be checked every 5 years, or more frequently if you are over 81 years old.  Skin check.  Lung cancer screening. You may have this screening every year starting at age 56 if you have a 30-pack-year history of smoking and currently smoke or have quit within the past 15 years.  Fecal occult blood test (FOBT) of the stool. You may have this test every year starting at age 28.  Flexible sigmoidoscopy or colonoscopy. You may have a sigmoidoscopy every 5 years or a colonoscopy every 10 years starting at age 53.  Hepatitis C blood test.  Hepatitis B blood test.  Sexually transmitted disease (STD) testing.  Diabetes screening. This is done by checking your blood sugar (glucose) after you have not eaten for a while (fasting). You may have this done every 1-3 years.  Bone density scan. This is done to screen for osteoporosis. You may have this done starting at age 61.  Mammogram. This may be done every 1-2 years. Talk to your health care  provider about how often you should have regular mammograms. Talk with your health care provider about your test results, treatment options, and if necessary, the need for more tests. Vaccines  Your health care provider may recommend certain  vaccines, such as:  Influenza vaccine. This is recommended every year.  Tetanus, diphtheria, and acellular pertussis (Tdap, Td) vaccine. You may need a Td booster every 10 years.  Zoster vaccine. You may need this after age 48.  Pneumococcal 13-valent conjugate (PCV13) vaccine. One dose is recommended after age 55.  Pneumococcal polysaccharide (PPSV23) vaccine. One dose is recommended after age 84. Talk to your health care provider about which screenings and vaccines you need and how often you need them. This information is not intended to replace advice given to you by your health care provider. Make sure you discuss any questions you have with your health care provider. Document Released: 12/10/2015 Document Revised: 08/02/2016 Document Reviewed: 09/14/2015 Elsevier Interactive Patient Education  2017 [place] Prevention in the Home Falls can cause injuries. They can happen to people of all ages. There are many things you can do to make your home safe and to help prevent falls. What can I do on the outside of my home?  Regularly fix the edges of walkways and driveways and fix any cracks.  Remove anything that might make you trip as you walk through a door, such as a raised step or threshold.  Trim any bushes or trees on the path to your home.  Use bright outdoor lighting.  Clear any walking paths of anything that might make someone trip, such as rocks or tools.  Regularly check to see if handrails are loose or broken. Make sure that both sides of any steps have handrails.  Any raised decks and porches should have guardrails on the edges.  Have any leaves, snow, or ice cleared regularly.  Use sand or salt on walking paths during winter.  Clean up any spills in your garage right away. This includes oil or grease spills. What can I do in the bathroom?  Use night lights.  Install grab bars by the toilet and in the tub and shower. Do not use towel bars as grab  bars.  Use non-skid mats or decals in the tub or shower.  If you need to sit down in the shower, use a plastic, non-slip stool.  Keep the floor dry. Clean up any water that spills on the floor as soon as it happens.  Remove soap buildup in the tub or shower regularly.  Attach bath mats securely with double-sided non-slip rug tape.  Do not have throw rugs and other things on the floor that can make you trip. What can I do in the bedroom?  Use night lights.  Make sure that you have a light by your bed that is easy to reach.  Do not use any sheets or blankets that are too big for your bed. They should not hang down onto the floor.  Have a firm chair that has side arms. You can use this for support while you get dressed.  Do not have throw rugs and other things on the floor that can make you trip. What can I do in the kitchen?  Clean up any spills right away.  Avoid walking on wet floors.  Keep items that you use a lot in easy-to-reach places.  If you need to reach something above you, use a strong step stool that has a grab bar.  Keep electrical cords out of the way.  Do not use floor polish or wax that makes floors slippery. If you must use wax, use non-skid floor wax.  Do not have throw rugs and other things on the floor that can make you trip. What can I do with my stairs?  Do not leave any items on the stairs.  Make sure that there are handrails on both sides of the stairs and use them. Fix handrails that are broken or loose. Make sure that handrails are as long as the stairways.  Check any carpeting to make sure that it is firmly attached to the stairs. Fix any carpet that is loose or worn.  Avoid having throw rugs at the top or bottom of the stairs. If you do have throw rugs, attach them to the floor with carpet tape.  Make sure that you have a light switch at the top of the stairs and the bottom of the stairs. If you do not have them, ask someone to add them for  you. What else can I do to help prevent falls?  Wear shoes that:  Do not have high heels.  Have rubber bottoms.  Are comfortable and fit you well.  Are closed at the toe. Do not wear sandals.  If you use a stepladder:  Make sure that it is fully opened. Do not climb a closed stepladder.  Make sure that both sides of the stepladder are locked into place.  Ask someone to hold it for you, if possible.  Clearly mark and make sure that you can see:  Any grab bars or handrails.  First and last steps.  Where the edge of each step is.  Use tools that help you move around (mobility aids) if they are needed. These include:  Canes.  Walkers.  Scooters.  Crutches.  Turn on the lights when you go into a dark area. Replace any light bulbs as soon as they burn out.  Set up your furniture so you have a clear path. Avoid moving your furniture around.  If any of your floors are uneven, fix them.  If there are any pets around you, be aware of where they are.  Review your medicines with your doctor. Some medicines can make you feel dizzy. This can increase your chance of falling. Ask your doctor what other things that you can do to help prevent falls. This information is not intended to replace advice given to you by your health care provider. Make sure you discuss any questions you have with your health care provider. Document Released: 09/09/2009 Document Revised: 04/20/2016 Document Reviewed: 12/18/2014 Elsevier Interactive Patient Education  2017 [employer].

## 2018-08-21 NOTE — Progress Notes (Signed)
Subjective:   Laura Rojas is a 72 y.o. female who presents for Medicare Annual (Subsequent) preventive examination.  Review of Systems:  Cardiac Risk Factors include: advanced age (>18men, >23 women);hypertension;dyslipidemia;diabetes mellitus;obesity (BMI >30kg/m2)     Objective:     Vitals: BP 122/66 (BP Location: Left Arm, Patient Position: Sitting)   Pulse 64   Temp 97.8 F (36.6 C) (Temporal)   Resp 16   Ht 5\' 2"  (1.575 m)   Wt 176 lb 4.8 oz (80 kg)   BMI 32.25 kg/m   Body mass index is 32.25 kg/m.  Advanced Directives 08/21/2018 07/18/2017  Does Patient Have a Medical Advance Directive? No No  Would patient like information on creating a medical advance directive? Yes (MAU/Ambulatory/Procedural Areas - Information given) No - Patient declined    Tobacco Social History   Tobacco Use  Smoking Status Never Smoker  Smokeless Tobacco Never Used     Counseling given: Not Answered   Clinical Intake:  Pre-visit preparation completed: Yes  Pain : No/denies pain     Nutritional Status: BMI > 30  Obese Nutritional Risks: None Diabetes: No  How often do you need to have someone help you when you read instructions, pamphlets, or other written materials from your doctor or pharmacy?: 1 - Never What is the last grade level you completed in school?: 1 year college   Interpreter Needed?: No  Information entered by :: Tiffany Hill,LPN   Past Medical History:  Diagnosis Date  . CAD (coronary artery disease)   . CAD (coronary artery disease) 12/31/2013   2007, 3.0 x 28 mm drug-eluting Cypher stent to RCA, 3.5 x 33 mm drug-eluting Cypher stent and proximal LAD) Consent by 2009 Normal perfusion by mildly 2013   . Dyslipidemia   . Polymyalgia rheumatica syndrome ([place])   . Subclavian artery stenosis, left ([place]) 01/01/2015   Past Surgical History:  Procedure Laterality Date  . BREAST BIOPSY Right 1970   EXCISIONAL - NEG  . BREAST BIOPSY Left 1973   EXCISIONAL - NEG    . CARDIAC CATHETERIZATION  08/25/2008   patent stents  . CAROTID DUPLEX  10/2006   NORMAL CAROTID ARTERY DUPLEX  . CORONARY ANGIOPLASTY WITH STENT PLACEMENT  09/17/2006   PTCA & stenting LAD  . ESOPHAGOGASTRODUODENOSCOPY N/A 04/27/2015   Procedure: ESOPHAGOGASTRODUODENOSCOPY (EGD);  Surgeon: Lucilla Lame, MD;  Location: [facility] ENDOSCOPY;  Service: Endoscopy;  Laterality: N/A;  . NM MYOCAR MULTIPLE W/SPECT  11/2011   EF 72%.NORMAL MYOCARDIAL PERFUSION STUDY  . REPLACEMENT TOTAL KNEE  2003 & 2004  . TRANSTHORACIC ECHOCARDIOGRAM  09/2006   EF =>55%. IMPAIRED LV RELAXATION. MILD MITRAL ANNULAR CALCIFICATION. AV-MILDLY SCLEROTIC. NO AS.  Marland Kitchen VAGINAL HYSTERECTOMY  1983   Family History  Problem Relation Age of Onset  . Breast cancer Mother 33  . Diabetes Sister   . Heart disease Maternal Grandmother   . Heart disease Maternal Grandfather   . Brain cancer Grandchild    Social History   Socioeconomic History  . Marital status: Married    Spouse name: Not on file  . Number of children: Not on file  . Years of education: Not on file  . Highest education level: Some college, no degree  Occupational History  . Not on file  Social Needs  . Financial resource strain: Not hard at all  . Food insecurity:    Worry: Never true    Inability: Never true  . Transportation needs:    Medical: No  Non-medical: No  Tobacco Use  . Smoking status: Never Smoker  . Smokeless tobacco: Never Used  Substance and Sexual Activity  . Alcohol use: No    Alcohol/week: 0.0 standard drinks  . Drug use: No  . Sexual activity: Not on file  Lifestyle  . Physical activity:    Days per week: 0 days    Minutes per session: 0 min  . Stress: Not at all  Relationships  . Social connections:    Talks on phone: More than three times a week    Gets together: More than three times a week    Attends religious service: More than 4 times per year    Active member of club or organization: Yes    Attends meetings of  clubs or organizations: More than 4 times per year    Relationship status: Married  Other Topics Concern  . Not on file  Social History Narrative  . Not on file    Outpatient Encounter Medications as of 08/21/2018  Medication Sig  . amLODipine (NORVASC) 2.5 MG tablet Take 1 tablet (2.5 mg total) by mouth daily. Please call to make appointment for further refills  . aspirin 81 MG tablet Take 81 mg by mouth daily.  . betamethasone dipropionate (DIPROLENE) 0.05 % cream Apply topically 2 (two) times daily.  . carvedilol (COREG) 6.25 MG tablet Take 1 tablet (6.25 mg total) by mouth 2 (two) times daily. KEEP OV.  . clopidogrel (PLAVIX) 75 MG tablet Take 1 tablet (75 mg total) by mouth daily.  . colchicine 0.6 MG tablet Take 1 tablet (0.6 mg total) by mouth daily.  . ferrous sulfate 325 (65 FE) MG tablet Take 325 mg by mouth daily with breakfast.  . hydrochlorothiazide (MICROZIDE) 12.5 MG capsule Take 1 capsule (12.5 mg total) by mouth daily.  . hydrOXYzine (ATARAX/VISTARIL) 25 MG tablet Take 1 tablet (25 mg total) by mouth 3 (three) times daily as needed.  Marland Kitchen LORazepam (ATIVAN) 0.5 MG tablet Take 1 tablet (0.5 mg total) by mouth at bedtime.  Marland Kitchen losartan (COZAAR) 100 MG tablet Take 1 tablet (100 mg total) by mouth daily.  . Magnesium Gluconate 550 MG TABS Take 30 mg by mouth daily.  . metFORMIN (GLUCOPHAGE) 1000 MG tablet Take 500 mg by mouth 2 (two) times daily.   Marland Kitchen NOVOLOG FLEXPEN 100 UNIT/ML FlexPen 6 UNITS WITH BREAKFAST, 10 UNITS WITH LUNCH AND 10 UNITS WITH DINNER - MAX 30 UNITS DAILY  . Omega-3 Fatty Acids (FISH OIL) 1200 MG CAPS Take 1,200 mg by mouth daily.  . pantoprazole (PROTONIX) 40 MG tablet Take 1 tablet (40 mg total) by mouth daily.  . predniSONE (DELTASONE) 20 MG tablet Take 20 mg by mouth daily.  . sertraline (ZOLOFT) 50 MG tablet Take 25 mg by mouth daily.  . simvastatin (ZOCOR) 40 MG tablet Take 1 tablet (40 mg total) by mouth daily at 6 PM.  . triamcinolone cream (KENALOG) 0.1  % Apply 1 application topically 2 (two) times daily.  Marland Kitchen doxycycline (VIBRA-TABS) 100 MG tablet Take 1 tablet (100 mg total) by mouth 2 (two) times daily. (Patient not taking: Reported on 08/21/2018)  . Insulin Glargine (LANTUS SOLOSTAR) 100 UNIT/ML Solostar Pen Inject into the skin.  . Magnesium 400 MG TABS Take 1 tablet by mouth daily.   No facility-administered encounter medications on file as of 08/21/2018.     Activities of Daily Living In your present state of health, do you have any difficulty performing the following activities: 08/21/2018  Hearing? N  Vision? N  Difficulty concentrating or making decisions? N  Walking or climbing stairs? N  Dressing or bathing? N  Doing errands, shopping? N  Preparing Food and eating ? N  Using the Toilet? N  In the past six months, have you accidently leaked urine? N  Do you have problems with loss of bowel control? N  Managing your Medications? N  Managing your Finances? N  Housekeeping or managing your Housekeeping? N  Some recent data might be hidden    Patient Care Team: Guadalupe Maple, MD as PCP - General (Family Medicine) Marlowe Sax, MD as Referring Physician (Internal Medicine) Warnell Forester, NP as Nurse Practitioner (Surgery)    Assessment:   This is a routine wellness examination for Laura Rojas.  Exercise Activities and Dietary recommendations Current Exercise Habits: Home exercise routine, Type of exercise: walking, Time (Minutes): 30, Frequency (Times/Week): 4, Weekly Exercise (Minutes/Week): 120, Intensity: Mild, Exercise limited by: None identified  Goals    . Increase water intake     Recommend drinking at least 6-8 glasses of water a day        Fall Risk Fall Risk  08/21/2018 05/15/2018 04/03/2018 03/06/2018 01/09/2018  Falls in the past year? No No No No No   Is the patient's home free of loose throw rugs in walkways, pet beds, electrical cords, etc?   yes      Grab bars in the bathroom? no       Handrails on the stairs?   yes      Adequate lighting?   no  Timed Get Up and Go performed: Completed in 8 seconds with no use of assistive devices, steady gait. No intervention needed at this time.   Depression Screen PHQ 2/9 Scores 08/21/2018 05/15/2018 04/03/2018 03/06/2018  PHQ - 2 Score 0 0 0 0  PHQ- 9 Score - - - -     Cognitive Function     6CIT Screen 08/21/2018 07/18/2017  What Year? 0 points 0 points  What month? 0 points 0 points  What time? 0 points 0 points  Count back from 20 0 points 0 points  Months in reverse 0 points 0 points  Repeat phrase 2 points 0 points  Total Score 2 0    Immunization History  Administered Date(s) Administered  . Influenza, High Dose Seasonal PF 09/04/2016, 08/23/2017  . Influenza,inj,Quad PF,6+ Mos 09/27/2015  . Influenza-Unspecified 09/18/2014  . Pneumococcal Conjugate-13 04/22/2014  . Pneumococcal Polysaccharide-23 11/12/2008, 07/05/2016  . Td 12/21/2009   Influenza: due now - declined today, will receive on 08/28/2018 appt  Pneumococcal: completed series Tetanus: TD completed 12/21/2009    Qualifies for Shingles Vaccine? Yes, discussed shingrix vaccine   Screening Tests Health Maintenance  Topic Date Due  . INFLUENZA VACCINE  06/27/2018  . HEMOGLOBIN A1C  11/06/2018  . COLONOSCOPY  12/17/2018  . FOOT EXAM  05/08/2019  . OPHTHALMOLOGY EXAM  07/11/2019  . TETANUS/TDAP  12/22/2019  . MAMMOGRAM  08/15/2020  . DEXA SCAN  Completed  . Hepatitis C Screening  Completed  . PNA vac Low Risk Adult  Completed    Cancer Screenings: Lung: Low Dose CT Chest recommended if Age 73-80 years, 30 pack-year currently smoking OR have quit w/in 15years. Patient does not qualify. Breast:  Up to date on Mammogram? Yes  08/15/2018 Up to date of Bone Density/Dexa? Yes 05/13/2014 Colorectal: completed 12/17/2008  Additional Screenings: Hepatitis C Screening: completed 07/05/2016     Plan:    I  have personally reviewed and addressed the Medicare  Annual Wellness questionnaire and have noted the following in the patient's chart:  A. Medical and social history B. Use of alcohol, tobacco or illicit drugs  C. Current medications and supplements D. Functional ability and status E.  Nutritional status F.  Physical activity G. Advance directives H. List of other physicians I.  Hospitalizations, surgeries, and ER visits in previous 12 months J.  [place] such as hearing and vision if needed, cognitive and depression L. Referrals and appointments   In addition, I have reviewed and discussed with patient certain preventive protocols, quality metrics, and best practice recommendations. A written personalized care plan for preventive services as well as general preventive health recommendations were provided to patient.   Signed,  Tyler Aas, LPN Nurse Health Advisor   Nurse Notes:none

## 2018-08-28 ENCOUNTER — Encounter: Payer: Self-pay | Admitting: Family Medicine

## 2018-08-28 ENCOUNTER — Ambulatory Visit (INDEPENDENT_AMBULATORY_CARE_PROVIDER_SITE_OTHER): Payer: Medicare Other | Admitting: Family Medicine

## 2018-08-28 VITALS — BP 132/74 | HR 60 | Temp 97.2°F | Ht 62.0 in | Wt 177.6 lb

## 2018-08-28 DIAGNOSIS — M0570 Rheumatoid arthritis with rheumatoid factor of unspecified site without organ or systems involvement: Secondary | ICD-10-CM

## 2018-08-28 DIAGNOSIS — Z23 Encounter for immunization: Secondary | ICD-10-CM

## 2018-08-28 DIAGNOSIS — I2583 Coronary atherosclerosis due to lipid rich plaque: Secondary | ICD-10-CM

## 2018-08-28 DIAGNOSIS — I10 Essential (primary) hypertension: Secondary | ICD-10-CM | POA: Diagnosis not present

## 2018-08-28 DIAGNOSIS — I771 Stricture of artery: Secondary | ICD-10-CM | POA: Diagnosis not present

## 2018-08-28 DIAGNOSIS — E1122 Type 2 diabetes mellitus with diabetic chronic kidney disease: Secondary | ICD-10-CM

## 2018-08-28 DIAGNOSIS — Z7189 Other specified counseling: Secondary | ICD-10-CM | POA: Diagnosis not present

## 2018-08-28 DIAGNOSIS — R05 Cough: Secondary | ICD-10-CM

## 2018-08-28 DIAGNOSIS — R053 Chronic cough: Secondary | ICD-10-CM

## 2018-08-28 DIAGNOSIS — N183 Chronic kidney disease, stage 3 (moderate): Secondary | ICD-10-CM | POA: Diagnosis not present

## 2018-08-28 DIAGNOSIS — I2510 Atherosclerotic heart disease of native coronary artery without angina pectoris: Secondary | ICD-10-CM

## 2018-08-28 DIAGNOSIS — E782 Mixed hyperlipidemia: Secondary | ICD-10-CM

## 2018-08-28 LAB — URINALYSIS, ROUTINE W REFLEX MICROSCOPIC
BILIRUBIN UA: NEGATIVE
GLUCOSE, UA: NEGATIVE
KETONES UA: NEGATIVE
Leukocytes, UA: NEGATIVE
Nitrite, UA: NEGATIVE
PROTEIN UA: NEGATIVE
RBC, UA: NEGATIVE
Specific Gravity, UA: 1.015 (ref 1.005–1.030)
Urobilinogen, Ur: 0.2 mg/dL (ref 0.2–1.0)
pH, UA: 7.5 (ref 5.0–7.5)

## 2018-08-28 MED ORDER — HYDROCHLOROTHIAZIDE 12.5 MG PO CAPS: 12.5000 mg | ORAL_CAPSULE | Freq: Every day | ORAL | 4 refills | Status: DC

## 2018-08-28 MED ORDER — SIMVASTATIN 40 MG PO TABS: 40.0000 mg | ORAL_TABLET | Freq: Every day | ORAL | 4 refills | Status: DC

## 2018-08-28 MED ORDER — HYDROXYZINE HCL 25 MG PO TABS: 25.0000 mg | ORAL_TABLET | Freq: Three times a day (TID) | ORAL | 4 refills | Status: DC | PRN

## 2018-08-28 MED ORDER — LOSARTAN POTASSIUM 100 MG PO TABS: 100.0000 mg | ORAL_TABLET | Freq: Every day | ORAL | 4 refills | Status: DC

## 2018-08-28 MED ORDER — BENZONATATE 100 MG PO CAPS: 100.0000 mg | ORAL_CAPSULE | Freq: Two times a day (BID) | ORAL | 0 refills | Status: DC | PRN

## 2018-08-28 MED ORDER — LORAZEPAM 0.5 MG PO TABS: 0.5000 mg | ORAL_TABLET | Freq: Every day | ORAL | 1 refills | Status: DC

## 2018-08-28 MED ORDER — PANTOPRAZOLE SODIUM 40 MG PO TBEC: 40.0000 mg | DELAYED_RELEASE_TABLET | Freq: Every day | ORAL | 4 refills | Status: DC

## 2018-08-28 MED ORDER — CLOPIDOGREL BISULFATE 75 MG PO TABS: 75.0000 mg | ORAL_TABLET | Freq: Every day | ORAL | 4 refills | Status: DC

## 2018-08-28 NOTE — Assessment & Plan Note (Signed)
The current medical regimen is effective;  continue present plan and medications.  

## 2018-08-28 NOTE — Patient Instructions (Signed)

## 2018-08-28 NOTE — Assessment & Plan Note (Signed)
A voluntary discussion about advanced care planning including explanation and discussion of advanced directives was extentively discussed with the patient.  Explained about the healthcare proxy and living will was reviewed and packet with forms with expiration of how to fill them out was given.  Time spent: Encounter 16+ min individuals present: Patient 

## 2018-08-28 NOTE — Assessment & Plan Note (Signed)
We will give some Tessalon Perles on standby

## 2018-08-28 NOTE — Assessment & Plan Note (Signed)
Stable no issues

## 2018-08-28 NOTE — Assessment & Plan Note (Signed)
Followed by rheumatology and taking prednisone

## 2018-08-28 NOTE — Progress Notes (Signed)
BP 132/74 (BP Location: Left Arm, Patient Position: Sitting, Cuff Size: Normal)   Pulse 60   Temp (!) 97.2 F (36.2 C) (Oral)   Ht 5\' 2"  (1.575 m)   Wt 177 lb 9.6 oz (80.6 kg)   SpO2 96%   BMI 32.48 kg/m    Subjective:    Patient ID: Laura Rojas, female    DOB: 07-22-1946, 72 y.o.   MRN: [id number]  HPI: Laura Rojas is a 72 y.o. female  Chief Complaint  Patient presents with  . Annual Exam  Diabetes being followed by endocrine. Patient unfortunately has been recently diagnosed with rheumatoid arthritis and control has been dependent on prednisone 7.5 mg once a day.  Of course has made her diabetes worse. Patient's blood pressures been stable on medications.  Chronic anxiety has been stable as long she is taking lorazepam 0.5 mg daily at bedtime.  She will need this medication long-term. Patient's use of lorazepam has not changed for years.  Relevant past medical, surgical, family and social history reviewed and updated as indicated. Interim medical history since our last visit reviewed. Allergies and medications reviewed and updated.   Other than noted above  Review of Systems  Constitutional: Negative.   HENT: Negative.   Eyes: Negative.   Respiratory: Negative.   Cardiovascular: Negative.   Gastrointestinal: Negative.   Endocrine: Negative.   Genitourinary: Negative.   Musculoskeletal: Negative.   Skin: Negative.   Allergic/Immunologic: Negative.   Neurological: Negative.   Hematological: Negative.   Psychiatric/Behavioral: Negative.     Per HPI unless specifically indicated above     Objective:    BP 132/74 (BP Location: Left Arm, Patient Position: Sitting, Cuff Size: Normal)   Pulse 60   Temp (!) 97.2 F (36.2 C) (Oral)   Ht 5\' 2"  (1.575 m)   Wt 177 lb 9.6 oz (80.6 kg)   SpO2 96%   BMI 32.48 kg/m   Wt Readings from Last 3 Encounters:  08/28/18 177 lb 9.6 oz (80.6 kg)  08/21/18 176 lb 4.8 oz (80 kg)  08/06/18 180 lb 6.4 oz (81.8 kg)      Physical Exam  Constitutional: She is oriented to person, place, and time. She appears well-developed and well-nourished.  HENT:  Head: Normocephalic and atraumatic.  Right Ear: External ear normal.  Left Ear: External ear normal.  Nose: Nose normal.  Mouth/Throat: Oropharynx is clear and moist.  Eyes: Pupils are equal, round, and reactive to light. Conjunctivae and EOM are normal.  Neck: Normal range of motion. Neck supple. Carotid bruit is not present.  Cardiovascular: Normal rate, regular rhythm and normal heart sounds.  No murmur heard. Pulmonary/Chest: Effort normal and breath sounds normal. She exhibits no mass. Right breast exhibits no mass, no skin change and no tenderness. Left breast exhibits no mass, no skin change and no tenderness. Breasts are symmetrical.  Abdominal: Soft. Bowel sounds are normal. There is no hepatosplenomegaly.  Musculoskeletal: Normal range of motion.  Neurological: She is alert and oriented to person, place, and time.  Skin: No rash noted.  Psychiatric: She has a normal mood and affect. Her behavior is normal. Judgment and thought content normal.    Results for orders placed or performed in visit on 07/12/18  HM DIABETES EYE EXAM  Result Value Ref Range   HM Diabetic Eye Exam No Retinopathy No Retinopathy      Assessment & Plan:   Problem List Items Addressed This Visit  Cardiovascular and Mediastinum   CAD (coronary artery disease)   Relevant Medications   simvastatin (ZOCOR) 40 MG tablet   losartan (COZAAR) 100 MG tablet   hydrochlorothiazide (MICROZIDE) 12.5 MG capsule   clopidogrel (PLAVIX) 75 MG tablet   Other Relevant Orders   Comprehensive metabolic panel   Lipid panel   CBC with Differential/Platelet   TSH   Urinalysis, Routine w reflex microscopic   Comprehensive metabolic panel   CBC with Differential/Platelet   TSH   Urinalysis, Routine w reflex microscopic   HTN (hypertension)    The current medical regimen is  effective;  continue present plan and medications.       Relevant Medications   simvastatin (ZOCOR) 40 MG tablet   losartan (COZAAR) 100 MG tablet   hydrochlorothiazide (MICROZIDE) 12.5 MG capsule   Other Relevant Orders   Comprehensive metabolic panel   CBC with Differential/Platelet   TSH   Urinalysis, Routine w reflex microscopic   Subclavian artery stenosis, left ([facility])    Stable no issues      Relevant Medications   simvastatin (ZOCOR) 40 MG tablet   losartan (COZAAR) 100 MG tablet   hydrochlorothiazide (MICROZIDE) 12.5 MG capsule     Endocrine   CKD stage 3 due to type 2 diabetes mellitus ([place])    The current medical regimen is effective;  continue present plan and medications.       Relevant Medications   simvastatin (ZOCOR) 40 MG tablet   losartan (COZAAR) 100 MG tablet     Musculoskeletal and Integument   Rheu arthritis w rheu factor of unsp site w/o org/sys involv ([facility])    Followed by rheumatology and taking prednisone        Other   Hyperlipidemia    The current medical regimen is effective;  continue present plan and medications.       Relevant Medications   simvastatin (ZOCOR) 40 MG tablet   losartan (COZAAR) 100 MG tablet   hydrochlorothiazide (MICROZIDE) 12.5 MG capsule   Other Relevant Orders   Comprehensive metabolic panel   Lipid panel   CBC with Differential/Platelet   TSH   Urinalysis, Routine w reflex microscopic   Chronic cough    We will give some Tessalon Perles on standby      Advanced care planning/counseling discussion    A voluntary discussion about advanced care planning including explanation and discussion of advanced directives was extentively discussed with the patient.  Explained about the healthcare proxy and living will was reviewed and packet with forms with expiration of how to fill them out was given.  Time spent: Encounter 16+ min individuals present: Patient       Other Visit Diagnoses    Need for influenza  vaccination    -  Primary   Relevant Orders   Flu vaccine HIGH DOSE PF (Fluzone High dose) (Completed)       Follow up plan: Return in about 6 months (around 02/27/2019) for BMP,  Lipids, ALT, AST.

## 2018-08-29 ENCOUNTER — Encounter: Payer: Self-pay | Admitting: Family Medicine

## 2018-08-29 LAB — CBC WITH DIFFERENTIAL/PLATELET
BASOS: 1 %
Basophils Absolute: 0 10*3/uL (ref 0.0–0.2)
EOS (ABSOLUTE): 0.2 10*3/uL (ref 0.0–0.4)
EOS: 2 %
HEMATOCRIT: 38.8 % (ref 34.0–46.6)
HEMOGLOBIN: 12.9 g/dL (ref 11.1–15.9)
IMMATURE GRANS (ABS): 0 10*3/uL (ref 0.0–0.1)
IMMATURE GRANULOCYTES: 0 %
LYMPHS: 47 %
Lymphocytes Absolute: 3.6 10*3/uL — ABNORMAL HIGH (ref 0.7–3.1)
MCH: 29.6 pg (ref 26.6–33.0)
MCHC: 33.2 g/dL (ref 31.5–35.7)
MCV: 89 fL (ref 79–97)
MONOCYTES: 8 %
Monocytes Absolute: 0.6 10*3/uL (ref 0.1–0.9)
NEUTROS PCT: 42 %
Neutrophils Absolute: 3.1 10*3/uL (ref 1.4–7.0)
Platelets: 255 10*3/uL (ref 150–450)
RBC: 4.36 x10E6/uL (ref 3.77–5.28)
RDW: 14.1 % (ref 12.3–15.4)
WBC: 7.4 10*3/uL (ref 3.4–10.8)

## 2018-08-29 LAB — TSH: TSH: 3.59 u[IU]/mL (ref 0.450–4.500)

## 2018-08-29 LAB — COMPREHENSIVE METABOLIC PANEL
ALK PHOS: 79 IU/L (ref 39–117)
ALT: 29 IU/L (ref 0–32)
AST: 19 IU/L (ref 0–40)
Albumin/Globulin Ratio: 2.2 (ref 1.2–2.2)
Albumin: 4.2 g/dL (ref 3.5–4.8)
BUN / CREAT RATIO: 14 (ref 12–28)
BUN: 14 mg/dL (ref 8–27)
Bilirubin Total: 0.3 mg/dL (ref 0.0–1.2)
CALCIUM: 9.6 mg/dL (ref 8.7–10.3)
CO2: 26 mmol/L (ref 20–29)
Chloride: 101 mmol/L (ref 96–106)
Creatinine, Ser: 0.98 mg/dL (ref 0.57–1.00)
GFR calc non Af Amer: 57 mL/min/{1.73_m2} — ABNORMAL LOW (ref >59)
GFR, EST AFRICAN AMERICAN: 66 mL/min/{1.73_m2} (ref >59)
Globulin, Total: 1.9 g/dL (ref 1.5–4.5)
Glucose: 126 mg/dL — ABNORMAL HIGH (ref 65–99)
POTASSIUM: 3.8 mmol/L (ref 3.5–5.2)
Sodium: 143 mmol/L (ref 134–144)
Total Protein: 6.1 g/dL (ref 6.0–8.5)

## 2018-08-29 LAB — LIPID PANEL
CHOLESTEROL TOTAL: 165 mg/dL (ref 100–199)
Chol/HDL Ratio: 2.9 ratio (ref 0.0–4.4)
HDL: 57 mg/dL (ref >39)
LDL Calculated: 74 mg/dL (ref 0–99)
Triglycerides: 170 mg/dL — ABNORMAL HIGH (ref 0–149)
VLDL Cholesterol Cal: 34 mg/dL (ref 5–40)

## 2018-09-03 DIAGNOSIS — M353 Polymyalgia rheumatica: Secondary | ICD-10-CM | POA: Diagnosis not present

## 2018-09-06 ENCOUNTER — Other Ambulatory Visit: Payer: Self-pay | Admitting: Cardiovascular Disease

## 2018-09-08 ENCOUNTER — Other Ambulatory Visit: Payer: Self-pay | Admitting: Cardiovascular Disease

## 2018-09-08 DIAGNOSIS — I10 Essential (primary) hypertension: Secondary | ICD-10-CM

## 2018-09-25 DIAGNOSIS — E785 Hyperlipidemia, unspecified: Secondary | ICD-10-CM | POA: Diagnosis not present

## 2018-09-25 DIAGNOSIS — E1159 Type 2 diabetes mellitus with other circulatory complications: Secondary | ICD-10-CM | POA: Diagnosis not present

## 2018-09-25 DIAGNOSIS — Z794 Long term (current) use of insulin: Secondary | ICD-10-CM | POA: Diagnosis not present

## 2018-09-25 DIAGNOSIS — E1165 Type 2 diabetes mellitus with hyperglycemia: Secondary | ICD-10-CM | POA: Diagnosis not present

## 2018-09-25 DIAGNOSIS — E1169 Type 2 diabetes mellitus with other specified complication: Secondary | ICD-10-CM | POA: Diagnosis not present

## 2018-09-25 DIAGNOSIS — I10 Essential (primary) hypertension: Secondary | ICD-10-CM | POA: Diagnosis not present

## 2018-10-15 ENCOUNTER — Encounter: Payer: Self-pay | Admitting: Family Medicine

## 2018-10-15 ENCOUNTER — Ambulatory Visit (INDEPENDENT_AMBULATORY_CARE_PROVIDER_SITE_OTHER): Payer: Medicare Other | Admitting: Family Medicine

## 2018-10-15 DIAGNOSIS — I2510 Atherosclerotic heart disease of native coronary artery without angina pectoris: Secondary | ICD-10-CM | POA: Diagnosis not present

## 2018-10-15 DIAGNOSIS — F339 Major depressive disorder, recurrent, unspecified: Secondary | ICD-10-CM

## 2018-10-15 DIAGNOSIS — I2583 Coronary atherosclerosis due to lipid rich plaque: Secondary | ICD-10-CM | POA: Diagnosis not present

## 2018-10-15 NOTE — Assessment & Plan Note (Signed)
Discussed recurrent ongoing depression will increase sertraline from 25 to 50 mg may increase to 100 mg depending on patient's response or lack of.  Will recheck in 1 month or so.

## 2018-10-15 NOTE — Progress Notes (Signed)
BP 135/71   Pulse 64   Temp 97.9 F (36.6 C) (Oral)   Wt 178 lb 6.4 oz (80.9 kg)   SpO2 96%   BMI 32.63 kg/m    Subjective:    Patient ID: Laura Rojas, female    DOB: Apr 16, 1946, 72 y.o.   MRN: [id number]  HPI: Laura Rojas is a 72 y.o. female  Chief Complaint  Patient presents with  . Depression    pt states she has had depression off and on throughout the years but states it has gotten bad recently    Patient follow-up depression and review of Dr. Waylan Boga notes on treating depression.  Patient's been taking 25 mg of sertraline long-term and feels that depression is getting worse with waves of depression coming on at times with some associated hot flashes. Reviewed lab work done last month which was essentially normal.  There is no inciting changes that could be explanatory.  Also reviewed the patient's medication and other than prednisone 5 mg which patient has to take will leave alone.  Relevant past medical, surgical, family and social history reviewed and updated as indicated. Interim medical history since our last visit reviewed. Allergies and medications reviewed and updated.  Review of Systems  Constitutional: Negative.   Respiratory: Negative.   Cardiovascular: Negative.   Psychiatric/Behavioral: Negative for suicidal ideas.    Per HPI unless specifically indicated above     Objective:    BP 135/71   Pulse 64   Temp 97.9 F (36.6 C) (Oral)   Wt 178 lb 6.4 oz (80.9 kg)   SpO2 96%   BMI 32.63 kg/m   Wt Readings from Last 3 Encounters:  10/15/18 178 lb 6.4 oz (80.9 kg)  08/28/18 177 lb 9.6 oz (80.6 kg)  08/21/18 176 lb 4.8 oz (80 kg)    Physical Exam  Constitutional: She is oriented to person, place, and time. She appears well-developed and well-nourished.  HENT:  Head: Normocephalic and atraumatic.  Eyes: Conjunctivae and EOM are normal.  Neck: Normal range of motion.  Cardiovascular: Normal rate, regular rhythm and normal heart sounds.    Pulmonary/Chest: Effort normal and breath sounds normal.  Musculoskeletal: Normal range of motion.  Neurological: She is alert and oriented to person, place, and time.  Skin: No erythema.  Psychiatric: She has a normal mood and affect. Her behavior is normal. Judgment and thought content normal.    Results for orders placed or performed in visit on 08/28/18  Comprehensive metabolic panel  Result Value Ref Range   Glucose 126 (H) 65 - 99 mg/dL   BUN 14 8 - 27 mg/dL   Creatinine, Ser 0.98 0.57 - 1.00 mg/dL   GFR calc non Af Amer 57 (L) >59 mL/min/1.73   GFR calc Af Amer 66 >59 mL/min/1.73   BUN/Creatinine Ratio 14 12 - 28   Sodium 143 134 - 144 mmol/L   Potassium 3.8 3.5 - 5.2 mmol/L   Chloride 101 96 - 106 mmol/L   CO2 26 20 - 29 mmol/L   Calcium 9.6 8.7 - 10.3 mg/dL   Total Protein 6.1 6.0 - 8.5 g/dL   Albumin 4.2 3.5 - 4.8 g/dL   Globulin, Total 1.9 1.5 - 4.5 g/dL   Albumin/Globulin Ratio 2.2 1.2 - 2.2   Bilirubin Total 0.3 0.0 - 1.2 mg/dL   Alkaline Phosphatase 79 39 - 117 IU/L   AST 19 0 - 40 IU/L   ALT 29 0 - 32 IU/L  Lipid  panel  Result Value Ref Range   Cholesterol, Total 165 100 - 199 mg/dL   Triglycerides 170 (H) 0 - 149 mg/dL   HDL 57 >39 mg/dL   VLDL Cholesterol Cal 34 5 - 40 mg/dL   LDL Calculated 74 0 - 99 mg/dL   Chol/HDL Ratio 2.9 0.0 - 4.4 ratio  CBC with Differential/Platelet  Result Value Ref Range   WBC 7.4 3.4 - 10.8 x10E3/uL   RBC 4.36 3.77 - 5.28 x10E6/uL   Hemoglobin 12.9 11.1 - 15.9 g/dL   Hematocrit 38.8 34.0 - 46.6 %   MCV 89 79 - 97 fL   MCH 29.6 26.6 - 33.0 pg   MCHC 33.2 31.5 - 35.7 g/dL   RDW 14.1 12.3 - 15.4 %   Platelets 255 150 - 450 x10E3/uL   Neutrophils 42 Not Estab. %   Lymphs 47 Not Estab. %   Monocytes 8 Not Estab. %   Eos 2 Not Estab. %   Basos 1 Not Estab. %   Neutrophils Absolute 3.1 1.4 - 7.0 x10E3/uL   Lymphocytes Absolute 3.6 (H) 0.7 - 3.1 x10E3/uL   Monocytes Absolute 0.6 0.1 - 0.9 x10E3/uL   EOS (ABSOLUTE) 0.2 0.0  - 0.4 x10E3/uL   Basophils Absolute 0.0 0.0 - 0.2 x10E3/uL   Immature Granulocytes 0 Not Estab. %   Immature Grans (Abs) 0.0 0.0 - 0.1 x10E3/uL  TSH  Result Value Ref Range   TSH 3.590 0.450 - 4.500 uIU/mL  Urinalysis, Routine w reflex microscopic  Result Value Ref Range   Specific Gravity, UA 1.015 1.005 - 1.030   pH, UA 7.5 5.0 - 7.5   Color, UA Yellow Yellow   Appearance Ur Clear Clear   Leukocytes, UA Negative Negative   Protein, UA Negative Negative/Trace   Glucose, UA Negative Negative   Ketones, UA Negative Negative   RBC, UA Negative Negative   Bilirubin, UA Negative Negative   Urobilinogen, Ur 0.2 0.2 - 1.0 mg/dL   Nitrite, UA Negative Negative      Assessment & Plan:   Problem List Items Addressed This Visit      Other   Depression, recurrent ([place])    Discussed recurrent ongoing depression will increase sertraline from 25 to 50 mg may increase to 100 mg depending on patient's response or lack of.  Will recheck in 1 month or so.          Follow up plan: Return in about 4 weeks (around 11/12/2018) for depression check.

## 2018-11-01 ENCOUNTER — Encounter: Payer: Self-pay | Admitting: Family Medicine

## 2018-11-12 DIAGNOSIS — Z7952 Long term (current) use of systemic steroids: Secondary | ICD-10-CM | POA: Diagnosis not present

## 2018-11-12 DIAGNOSIS — R768 Other specified abnormal immunological findings in serum: Secondary | ICD-10-CM | POA: Diagnosis not present

## 2018-11-12 DIAGNOSIS — M353 Polymyalgia rheumatica: Secondary | ICD-10-CM | POA: Diagnosis not present

## 2018-11-12 DIAGNOSIS — D3613 Benign neoplasm of peripheral nerves and autonomic nervous system of lower limb, including hip: Secondary | ICD-10-CM | POA: Diagnosis not present

## 2018-11-12 DIAGNOSIS — M7551 Bursitis of right shoulder: Secondary | ICD-10-CM | POA: Diagnosis not present

## 2018-11-18 ENCOUNTER — Telehealth: Payer: Self-pay | Admitting: Cardiovascular Disease

## 2018-11-18 NOTE — Telephone Encounter (Signed)
New Message    *STAT* If patient is at the pharmacy, call can be transferred to refill team.   1. Which medications need to be refilled? (please list name of each medication and dose if known) carvedilol (COREG) 6.25 MG tablet  2. Which pharmacy/location (including street and city if local pharmacy) is medication to be sent to? [id number] - [place], [place] - [address]  3. Do they need a 30 day or 90 day supply? 90  Pt states she takes half a pill twice daily and like to know if she can get the half dose instead of cutting the pill

## 2018-11-22 NOTE — Telephone Encounter (Signed)
Yes, ok to represcribe as 3.125 mg twice daily #180, refill 3 MCr

## 2018-11-26 DIAGNOSIS — M79671 Pain in right foot: Secondary | ICD-10-CM | POA: Diagnosis not present

## 2018-11-26 DIAGNOSIS — M2041 Other hammer toe(s) (acquired), right foot: Secondary | ICD-10-CM | POA: Diagnosis not present

## 2018-11-26 DIAGNOSIS — M2011 Hallux valgus (acquired), right foot: Secondary | ICD-10-CM | POA: Diagnosis not present

## 2018-12-02 MED ORDER — CARVEDILOL 3.125 MG PO TABS: 3.1250 mg | ORAL_TABLET | Freq: Two times a day (BID) | ORAL | 3 refills | Status: DC

## 2018-12-03 ENCOUNTER — Ambulatory Visit: Payer: Medicare Other | Admitting: Family Medicine

## 2018-12-11 ENCOUNTER — Other Ambulatory Visit: Payer: Self-pay | Admitting: Family Medicine

## 2018-12-17 ENCOUNTER — Telehealth: Payer: Self-pay | Admitting: Family Medicine

## 2018-12-17 ENCOUNTER — Ambulatory Visit (INDEPENDENT_AMBULATORY_CARE_PROVIDER_SITE_OTHER): Payer: Medicare Other | Admitting: Family Medicine

## 2018-12-17 ENCOUNTER — Encounter: Payer: Self-pay | Admitting: Family Medicine

## 2018-12-17 DIAGNOSIS — F339 Major depressive disorder, recurrent, unspecified: Secondary | ICD-10-CM | POA: Diagnosis not present

## 2018-12-17 DIAGNOSIS — F5101 Primary insomnia: Secondary | ICD-10-CM | POA: Diagnosis not present

## 2018-12-17 MED ORDER — LORAZEPAM 0.5 MG PO TABS: 0.5000 mg | ORAL_TABLET | Freq: Every day | ORAL | 1 refills | Status: DC | PRN

## 2018-12-17 MED ORDER — SERTRALINE HCL 100 MG PO TABS: 100.0000 mg | ORAL_TABLET | Freq: Every day | ORAL | 3 refills | Status: DC

## 2018-12-17 NOTE — Assessment & Plan Note (Signed)
Discussed worsening depression will increase Zoloft to 100 mg gave prescription.  In the meantime with waiting for Zoloft to become more effective will use lorazepam on a as needed basis at lunchtime.

## 2018-12-17 NOTE — Telephone Encounter (Signed)
Copied from [place] [id number]. Topic: Quick Communication - Rx Refill/Question >> Dec 17, 2018 12:42 PM Margot Ables wrote: Medication: LORazepam (ATIVAN) 0.5 MG tablet  - received new RX and asking if pt is to take 1/day and 1/bedtime. If so RX needs to be updated for #60 doses per month and new instructions.  Last fill date 10/10/18 #90 based on RX for 1/bedtime. Please advise.  Has the patient contacted their pharmacy? yes Preferred Pharmacy (with phone number or street name): [id number] - [place], [place] [phone] (Phone) [phone] (Fax)

## 2018-12-17 NOTE — Progress Notes (Signed)
BP 140/80 (BP Location: Left Arm, Cuff Size: Normal)   Pulse (!) 56   Temp 98.1 F (36.7 C) (Oral)   Wt 174 lb (78.9 kg)   SpO2 95%   BMI 31.83 kg/m    Subjective:    Patient ID: Laura Rojas, female    DOB: 04-May-1946, 73 y.o.   MRN: [id number]  HPI: Laura Rojas is a 73 y.o. female  Chief Complaint  Patient presents with  . Depression   Patient with complaints of continued depression especially in the afternoons with waves of essentially fear and depression coming over after lunch.  Lasts all afternoon and into the evening.  Has been on Zoloft 50 mg long-term takes lorazepam 0.5 at bedtime but took an extra 1 at lunchtime and abated her afternoon depression.  Relevant past medical, surgical, family and social history reviewed and updated as indicated. Interim medical history since our last visit reviewed. Allergies and medications reviewed and updated.  Review of Systems  Constitutional: Negative.   Respiratory: Negative.   Cardiovascular: Negative.     Per HPI unless specifically indicated above     Objective:    BP 140/80 (BP Location: Left Arm, Cuff Size: Normal)   Pulse (!) 56   Temp 98.1 F (36.7 C) (Oral)   Wt 174 lb (78.9 kg)   SpO2 95%   BMI 31.83 kg/m   Wt Readings from Last 3 Encounters:  12/17/18 174 lb (78.9 kg)  10/15/18 178 lb 6.4 oz (80.9 kg)  08/28/18 177 lb 9.6 oz (80.6 kg)    Physical Exam Constitutional:      Appearance: She is well-developed.  HENT:     Head: Normocephalic and atraumatic.  Eyes:     Conjunctiva/sclera: Conjunctivae normal.  Neck:     Musculoskeletal: Normal range of motion.  Cardiovascular:     Rate and Rhythm: Normal rate and regular rhythm.     Heart sounds: Normal heart sounds.  Pulmonary:     Effort: Pulmonary effort is normal.     Breath sounds: Normal breath sounds.  Musculoskeletal: Normal range of motion.  Skin:    Findings: No erythema.  Neurological:     Mental Status: She is alert and oriented  to person, place, and time.  Psychiatric:        Behavior: Behavior normal.        Thought Content: Thought content normal.        Judgment: Judgment normal.     Results for orders placed or performed in visit on 08/28/18  Comprehensive metabolic panel  Result Value Ref Range   Glucose 126 (H) 65 - 99 mg/dL   BUN 14 8 - 27 mg/dL   Creatinine, Ser 0.98 0.57 - 1.00 mg/dL   GFR calc non Af Amer 57 (L) >59 mL/min/1.73   GFR calc Af Amer 66 >59 mL/min/1.73   BUN/Creatinine Ratio 14 12 - 28   Sodium 143 134 - 144 mmol/L   Potassium 3.8 3.5 - 5.2 mmol/L   Chloride 101 96 - 106 mmol/L   CO2 26 20 - 29 mmol/L   Calcium 9.6 8.7 - 10.3 mg/dL   Total Protein 6.1 6.0 - 8.5 g/dL   Albumin 4.2 3.5 - 4.8 g/dL   Globulin, Total 1.9 1.5 - 4.5 g/dL   Albumin/Globulin Ratio 2.2 1.2 - 2.2   Bilirubin Total 0.3 0.0 - 1.2 mg/dL   Alkaline Phosphatase 79 39 - 117 IU/L   AST 19 0 - 40 IU/L  ALT 29 0 - 32 IU/L  Lipid panel  Result Value Ref Range   Cholesterol, Total 165 100 - 199 mg/dL   Triglycerides 170 (H) 0 - 149 mg/dL   HDL 57 >39 mg/dL   VLDL Cholesterol Cal 34 5 - 40 mg/dL   LDL Calculated 74 0 - 99 mg/dL   Chol/HDL Ratio 2.9 0.0 - 4.4 ratio  CBC with Differential/Platelet  Result Value Ref Range   WBC 7.4 3.4 - 10.8 x10E3/uL   RBC 4.36 3.77 - 5.28 x10E6/uL   Hemoglobin 12.9 11.1 - 15.9 g/dL   Hematocrit 38.8 34.0 - 46.6 %   MCV 89 79 - 97 fL   MCH 29.6 26.6 - 33.0 pg   MCHC 33.2 31.5 - 35.7 g/dL   RDW 14.1 12.3 - 15.4 %   Platelets 255 150 - 450 x10E3/uL   Neutrophils 42 Not Estab. %   Lymphs 47 Not Estab. %   Monocytes 8 Not Estab. %   Eos 2 Not Estab. %   Basos 1 Not Estab. %   Neutrophils Absolute 3.1 1.4 - 7.0 x10E3/uL   Lymphocytes Absolute 3.6 (H) 0.7 - 3.1 x10E3/uL   Monocytes Absolute 0.6 0.1 - 0.9 x10E3/uL   EOS (ABSOLUTE) 0.2 0.0 - 0.4 x10E3/uL   Basophils Absolute 0.0 0.0 - 0.2 x10E3/uL   Immature Granulocytes 0 Not Estab. %   Immature Grans (Abs) 0.0 0.0 - 0.1  x10E3/uL  TSH  Result Value Ref Range   TSH 3.590 0.450 - 4.500 uIU/mL  Urinalysis, Routine w reflex microscopic  Result Value Ref Range   Specific Gravity, UA 1.015 1.005 - 1.030   pH, UA 7.5 5.0 - 7.5   Color, UA Yellow Yellow   Appearance Ur Clear Clear   Leukocytes, UA Negative Negative   Protein, UA Negative Negative/Trace   Glucose, UA Negative Negative   Ketones, UA Negative Negative   RBC, UA Negative Negative   Bilirubin, UA Negative Negative   Urobilinogen, Ur 0.2 0.2 - 1.0 mg/dL   Nitrite, UA Negative Negative      Assessment & Plan:   Problem List Items Addressed This Visit      Other   Insomnia    Continue evening lorazepam for insomnia      Depression, recurrent (HCC)    Discussed worsening depression will increase Zoloft to 100 mg gave prescription.  In the meantime with waiting for Zoloft to become more effective will use lorazepam on a as needed basis at lunchtime.      Relevant Medications   sertraline (ZOLOFT) 100 MG tablet   LORazepam (ATIVAN) 0.5 MG tablet       Follow up plan: Return in about 4 weeks (around 01/14/2019).

## 2018-12-17 NOTE — Assessment & Plan Note (Signed)
Continue evening lorazepam for insomnia

## 2018-12-18 MED ORDER — LORAZEPAM 0.5 MG PO TABS: 0.5000 mg | ORAL_TABLET | Freq: Every day | ORAL | 1 refills | Status: DC | PRN

## 2018-12-19 MED ORDER — LORAZEPAM 0.5 MG PO TABS: 0.5000 mg | ORAL_TABLET | Freq: Every day | ORAL | 1 refills | Status: DC

## 2018-12-19 NOTE — Addendum Note (Signed)
Addended by: Golden Pop A on: 12/19/2018 04:25 PM   Modules accepted: Orders

## 2018-12-19 NOTE — Telephone Encounter (Signed)
Left message on machine for pt to return call to the office.  

## 2018-12-19 NOTE — Telephone Encounter (Signed)
Call pt Tried to fix not sure if I did as computer limits what we say.

## 2018-12-19 NOTE — Telephone Encounter (Signed)
Laura Rojas presented in office stating that the pharmacy is unable to fill her prescription the way it is being sent. She states that the new prescription for the lorazepam in the morning needs to say for AM being that they already have a prescription for the PM. Please advise

## 2018-12-23 DIAGNOSIS — Z7952 Long term (current) use of systemic steroids: Secondary | ICD-10-CM | POA: Diagnosis not present

## 2018-12-23 DIAGNOSIS — M353 Polymyalgia rheumatica: Secondary | ICD-10-CM | POA: Diagnosis not present

## 2019-01-02 DIAGNOSIS — E1165 Type 2 diabetes mellitus with hyperglycemia: Secondary | ICD-10-CM | POA: Diagnosis not present

## 2019-01-02 DIAGNOSIS — E1159 Type 2 diabetes mellitus with other circulatory complications: Secondary | ICD-10-CM | POA: Diagnosis not present

## 2019-01-02 DIAGNOSIS — E1169 Type 2 diabetes mellitus with other specified complication: Secondary | ICD-10-CM | POA: Diagnosis not present

## 2019-01-02 DIAGNOSIS — E785 Hyperlipidemia, unspecified: Secondary | ICD-10-CM | POA: Diagnosis not present

## 2019-01-02 DIAGNOSIS — Z794 Long term (current) use of insulin: Secondary | ICD-10-CM | POA: Diagnosis not present

## 2019-01-02 DIAGNOSIS — I10 Essential (primary) hypertension: Secondary | ICD-10-CM | POA: Diagnosis not present

## 2019-01-02 DIAGNOSIS — Z7952 Long term (current) use of systemic steroids: Secondary | ICD-10-CM | POA: Diagnosis not present

## 2019-01-02 LAB — HEMOGLOBIN A1C: Hemoglobin A1C: 9.6

## 2019-01-08 ENCOUNTER — Ambulatory Visit (INDEPENDENT_AMBULATORY_CARE_PROVIDER_SITE_OTHER): Payer: Medicare Other | Admitting: Family Medicine

## 2019-01-08 ENCOUNTER — Encounter: Payer: Self-pay | Admitting: Family Medicine

## 2019-01-08 DIAGNOSIS — N183 Chronic kidney disease, stage 3 (moderate): Secondary | ICD-10-CM | POA: Diagnosis not present

## 2019-01-08 DIAGNOSIS — F339 Major depressive disorder, recurrent, unspecified: Secondary | ICD-10-CM

## 2019-01-08 DIAGNOSIS — I10 Essential (primary) hypertension: Secondary | ICD-10-CM | POA: Diagnosis not present

## 2019-01-08 DIAGNOSIS — M0570 Rheumatoid arthritis with rheumatoid factor of unspecified site without organ or systems involvement: Secondary | ICD-10-CM

## 2019-01-08 DIAGNOSIS — I771 Stricture of artery: Secondary | ICD-10-CM

## 2019-01-08 DIAGNOSIS — E1122 Type 2 diabetes mellitus with diabetic chronic kidney disease: Secondary | ICD-10-CM

## 2019-01-08 MED ORDER — LORAZEPAM 0.5 MG PO TABS: 0.5000 mg | ORAL_TABLET | Freq: Two times a day (BID) | ORAL | 1 refills | Status: DC

## 2019-01-08 NOTE — Assessment & Plan Note (Signed)
stable °

## 2019-01-08 NOTE — Assessment & Plan Note (Signed)
The current medical regimen is effective;  continue present plan and medications.  

## 2019-01-08 NOTE — Progress Notes (Signed)
BP 138/80   Pulse 69   Temp 98.1 F (36.7 C) (Oral)   SpO2 98%    Subjective:    Patient ID: Laura Rojas, female    DOB: 1946/03/05, 73 y.o.   MRN: [id number]  HPI: Laura Rojas is a 73 y.o. female  Chief Complaint  Patient presents with  . Depression  Patient follow-up depression nerves seems to be doing okay.  Has had some ups and downs but all in all seems to be doing okay. For anxiety has taken lorazepam which is made a huge difference.  We will continue the lorazepam. Patient having some nonspecific right flank area pain discomfort is been ongoing for about 2 weeks no specific irritation or known trauma.  No rash in this area no change with position time a day are activity levels.  No blood in stool or urine. Blood pressure doing well along with other medications. Followed by endocrinology for diabetes which is doing poorly the patient is trying to adjust.  Relevant past medical, surgical, family and social history reviewed and updated as indicated. Interim medical history since our last visit reviewed. Allergies and medications reviewed and updated.  Review of Systems  Constitutional: Negative.   Respiratory: Negative.   Cardiovascular: Negative.     Per HPI unless specifically indicated above     Objective:    BP 138/80   Pulse 69   Temp 98.1 F (36.7 C) (Oral)   SpO2 98%   Wt Readings from Last 3 Encounters:  12/17/18 174 lb (78.9 kg)  10/15/18 178 lb 6.4 oz (80.9 kg)  08/28/18 177 lb 9.6 oz (80.6 kg)    Physical Exam Constitutional:      Appearance: She is well-developed.  HENT:     Head: Normocephalic and atraumatic.  Eyes:     Conjunctiva/sclera: Conjunctivae normal.  Neck:     Musculoskeletal: Normal range of motion.  Cardiovascular:     Rate and Rhythm: Normal rate and regular rhythm.     Heart sounds: Normal heart sounds.  Pulmonary:     Effort: Pulmonary effort is normal.     Breath sounds: Normal breath sounds.  Abdominal:   General: Abdomen is flat. Bowel sounds are normal.     Palpations: Abdomen is soft.  Musculoskeletal: Normal range of motion.  Skin:    Findings: No erythema.  Neurological:     Mental Status: She is alert and oriented to person, place, and time.  Psychiatric:        Behavior: Behavior normal.        Thought Content: Thought content normal.        Judgment: Judgment normal.     Results for orders placed or performed in visit on 01/08/19  Hemoglobin A1c  Result Value Ref Range   Hemoglobin A1C 9.6       Assessment & Plan:   Problem List Items Addressed This Visit      Cardiovascular and Mediastinum   HTN (hypertension)    The current medical regimen is effective;  continue present plan and medications.       Subclavian artery stenosis, left (HCC)    stable        Endocrine   CKD stage 3 due to type 2 diabetes mellitus ([place])    The current medical regimen is effective;  continue present plan and medications.         Musculoskeletal and Integument   Rheu arthritis w rheu factor of unsp site w/o  org/sys involv ([place])    Followed by rheumatology        Other   Depression, recurrent ([place])    The current medical regimen is effective;  continue present plan and medications.       Relevant Medications   LORazepam (ATIVAN) 0.5 MG tablet       Follow up plan: Return in about 6 months (around 07/09/2019) for med check.

## 2019-01-08 NOTE — Assessment & Plan Note (Signed)
Followed by rheumatology. 

## 2019-01-21 DIAGNOSIS — Z7952 Long term (current) use of systemic steroids: Secondary | ICD-10-CM | POA: Diagnosis not present

## 2019-01-21 DIAGNOSIS — M353 Polymyalgia rheumatica: Secondary | ICD-10-CM | POA: Diagnosis not present

## 2019-02-10 DIAGNOSIS — R768 Other specified abnormal immunological findings in serum: Secondary | ICD-10-CM | POA: Diagnosis not present

## 2019-02-10 DIAGNOSIS — Z7952 Long term (current) use of systemic steroids: Secondary | ICD-10-CM | POA: Diagnosis not present

## 2019-02-10 DIAGNOSIS — M353 Polymyalgia rheumatica: Secondary | ICD-10-CM | POA: Diagnosis not present

## 2019-03-05 ENCOUNTER — Ambulatory Visit: Payer: Medicare Other | Admitting: Family Medicine

## 2019-03-15 ENCOUNTER — Other Ambulatory Visit: Payer: Self-pay | Admitting: Cardiovascular Disease

## 2019-03-15 DIAGNOSIS — I10 Essential (primary) hypertension: Secondary | ICD-10-CM

## 2019-05-07 ENCOUNTER — Encounter: Payer: Self-pay | Admitting: Family Medicine

## 2019-05-07 ENCOUNTER — Other Ambulatory Visit: Payer: Self-pay

## 2019-05-07 ENCOUNTER — Ambulatory Visit (INDEPENDENT_AMBULATORY_CARE_PROVIDER_SITE_OTHER): Payer: Medicare Other | Admitting: Family Medicine

## 2019-05-07 DIAGNOSIS — N183 Chronic kidney disease, stage 3 unspecified: Secondary | ICD-10-CM

## 2019-05-07 DIAGNOSIS — E782 Mixed hyperlipidemia: Secondary | ICD-10-CM

## 2019-05-07 DIAGNOSIS — M0570 Rheumatoid arthritis with rheumatoid factor of unspecified site without organ or systems involvement: Secondary | ICD-10-CM | POA: Diagnosis not present

## 2019-05-07 DIAGNOSIS — E1122 Type 2 diabetes mellitus with diabetic chronic kidney disease: Secondary | ICD-10-CM | POA: Diagnosis not present

## 2019-05-07 DIAGNOSIS — F413 Other mixed anxiety disorders: Secondary | ICD-10-CM

## 2019-05-07 DIAGNOSIS — I10 Essential (primary) hypertension: Secondary | ICD-10-CM | POA: Diagnosis not present

## 2019-05-07 MED ORDER — LORAZEPAM 0.5 MG PO TABS: 0.5000 mg | ORAL_TABLET | Freq: Two times a day (BID) | ORAL | 1 refills | Status: DC

## 2019-05-07 NOTE — Progress Notes (Signed)
There were no vitals taken for this visit.   Subjective:    Patient ID: Laura Rojas, female    DOB: [birth year], 73 y.o.   MRN: [id number]  HPI: Laura Rojas is a 73 y.o. female  Med check Discussed with patient all in all doing well no complaints endocrinology is adjusting diabetes medications and following.  Patient had to increase her prednisone but is doing stable. Blood pressure depression nerves all doing better especially with lorazepam 0.5 twice a day.  Patient sleep is better.    Relevant past medical, surgical, family and social history reviewed and updated as indicated. Interim medical history since our last visit reviewed. Allergies and medications reviewed and updated.  Review of Systems  Constitutional: Negative.   Respiratory: Negative.   Cardiovascular: Negative.     Per HPI unless specifically indicated above     Objective:    There were no vitals taken for this visit.  Wt Readings from Last 3 Encounters:  12/17/18 174 lb (78.9 kg)  10/15/18 178 lb 6.4 oz (80.9 kg)  08/28/18 177 lb 9.6 oz (80.6 kg)    Physical Exam  Results for orders placed or performed in visit on 01/08/19  Hemoglobin A1c  Result Value Ref Range   Hemoglobin A1C 9.6       Assessment & Plan:   Problem List Items Addressed This Visit      Cardiovascular and Mediastinum   HTN (hypertension)    The current medical regimen is effective;  continue present plan and medications.       Relevant Orders   Basic metabolic panel     Endocrine   CKD stage 3 due to type 2 diabetes mellitus ([place])    Followed by endocrinology        Musculoskeletal and Integument   Rheu arthritis w rheu factor of unsp site w/o org/sys involv [facility])    The current medical regimen is effective;  continue present plan and medications.         Other   Hyperlipidemia    The current medical regimen is effective;  continue present plan and medications.       Relevant Orders   LP+ALT+AST  Piccolo, Waived   Anxiety disorder    Discussed anxiety care and treatment will continue with lorazepam twice a day.      Relevant Medications   LORazepam (ATIVAN) 0.5 MG tablet      Telemedicine using audio/video telecommunications for a synchronous communication visit. Today's visit due to COVID-19 isolation precautions I connected with and verified that I am speaking with the correct person using two identifiers.   I discussed the limitations, risks, security and privacy concerns of performing an evaluation and management service by telecommunication and the availability of in person appointments. I also discussed with the patient that there may be a patient responsible charge related to this service. The patient expressed understanding and agreed to proceed. The patient's location is home. I am at home.   I discussed the assessment and treatment plan with the patient. The patient was provided an opportunity to ask questions and all were answered. The patient agreed with the plan and demonstrated an understanding of the instructions.   The patient was advised to call back or seek an in-person evaluation if the symptoms worsen or if the condition fails to improve as anticipated.   I provided 21+ minutes of time during this encounter. Follow up plan: Return in about 6 months (around 11/06/2019) for  Physical Exam.

## 2019-05-07 NOTE — Assessment & Plan Note (Signed)
The current medical regimen is effective;  continue present plan and medications.  

## 2019-05-07 NOTE — Assessment & Plan Note (Signed)
Followed by endocrinology 

## 2019-05-07 NOTE — Assessment & Plan Note (Signed)
Discussed anxiety care and treatment will continue with lorazepam twice a day.

## 2019-05-08 ENCOUNTER — Other Ambulatory Visit: Payer: Medicare Other

## 2019-05-08 ENCOUNTER — Other Ambulatory Visit: Payer: Self-pay

## 2019-05-08 DIAGNOSIS — I10 Essential (primary) hypertension: Secondary | ICD-10-CM | POA: Diagnosis not present

## 2019-05-08 DIAGNOSIS — E782 Mixed hyperlipidemia: Secondary | ICD-10-CM

## 2019-05-08 LAB — LP+ALT+AST PICCOLO, WAIVED
ALT (SGPT) Piccolo, Waived: 29 U/L (ref 10–47)
AST (SGOT) Piccolo, Waived: 26 U/L (ref 11–38)
Chol/HDL Ratio Piccolo,Waive: 3.1 mg/dL
Cholesterol Piccolo, Waived: 170 mg/dL (ref <200)
HDL Chol Piccolo, Waived: 55 mg/dL — ABNORMAL LOW (ref >59)
LDL Chol Calc Piccolo Waived: 41 mg/dL (ref <100)
Triglycerides Piccolo,Waived: 367 mg/dL — ABNORMAL HIGH (ref <150)
VLDL Chol Calc Piccolo,Waive: 73 mg/dL — ABNORMAL HIGH (ref <30)

## 2019-05-09 LAB — BASIC METABOLIC PANEL
BUN/Creatinine Ratio: 19 (ref 12–28)
BUN: 19 mg/dL (ref 8–27)
CO2: 25 mmol/L (ref 20–29)
Calcium: 9.7 mg/dL (ref 8.7–10.3)
Chloride: 100 mmol/L (ref 96–106)
Creatinine, Ser: 0.98 mg/dL (ref 0.57–1.00)
GFR calc Af Amer: 66 mL/min/{1.73_m2} (ref >59)
GFR calc non Af Amer: 57 mL/min/{1.73_m2} — ABNORMAL LOW (ref >59)
Glucose: 275 mg/dL — ABNORMAL HIGH (ref 65–99)
Potassium: 4.2 mmol/L (ref 3.5–5.2)
Sodium: 139 mmol/L (ref 134–144)

## 2019-05-10 ENCOUNTER — Encounter: Payer: Self-pay | Admitting: Family Medicine

## 2019-05-14 DIAGNOSIS — M353 Polymyalgia rheumatica: Secondary | ICD-10-CM | POA: Diagnosis not present

## 2019-05-14 DIAGNOSIS — R768 Other specified abnormal immunological findings in serum: Secondary | ICD-10-CM | POA: Diagnosis not present

## 2019-06-04 DIAGNOSIS — E785 Hyperlipidemia, unspecified: Secondary | ICD-10-CM | POA: Diagnosis not present

## 2019-06-04 DIAGNOSIS — Z7952 Long term (current) use of systemic steroids: Secondary | ICD-10-CM | POA: Diagnosis not present

## 2019-06-04 DIAGNOSIS — I10 Essential (primary) hypertension: Secondary | ICD-10-CM | POA: Diagnosis not present

## 2019-06-04 DIAGNOSIS — Z794 Long term (current) use of insulin: Secondary | ICD-10-CM | POA: Diagnosis not present

## 2019-06-04 DIAGNOSIS — E1159 Type 2 diabetes mellitus with other circulatory complications: Secondary | ICD-10-CM | POA: Diagnosis not present

## 2019-06-04 DIAGNOSIS — E1165 Type 2 diabetes mellitus with hyperglycemia: Secondary | ICD-10-CM | POA: Diagnosis not present

## 2019-06-04 DIAGNOSIS — E1169 Type 2 diabetes mellitus with other specified complication: Secondary | ICD-10-CM | POA: Diagnosis not present

## 2019-07-17 ENCOUNTER — Encounter: Payer: Self-pay | Admitting: Family Medicine

## 2019-07-17 ENCOUNTER — Ambulatory Visit (INDEPENDENT_AMBULATORY_CARE_PROVIDER_SITE_OTHER): Payer: Medicare Other | Admitting: Family Medicine

## 2019-07-17 ENCOUNTER — Other Ambulatory Visit: Payer: Self-pay | Admitting: Family Medicine

## 2019-07-17 ENCOUNTER — Other Ambulatory Visit: Payer: Self-pay

## 2019-07-17 DIAGNOSIS — F339 Major depressive disorder, recurrent, unspecified: Secondary | ICD-10-CM | POA: Diagnosis not present

## 2019-07-17 DIAGNOSIS — I25708 Atherosclerosis of coronary artery bypass graft(s), unspecified, with other forms of angina pectoris: Secondary | ICD-10-CM | POA: Diagnosis not present

## 2019-07-17 DIAGNOSIS — Z1231 Encounter for screening mammogram for malignant neoplasm of breast: Secondary | ICD-10-CM

## 2019-07-17 DIAGNOSIS — M0570 Rheumatoid arthritis with rheumatoid factor of unspecified site without organ or systems involvement: Secondary | ICD-10-CM | POA: Diagnosis not present

## 2019-07-17 DIAGNOSIS — I10 Essential (primary) hypertension: Secondary | ICD-10-CM | POA: Diagnosis not present

## 2019-07-17 MED ORDER — SERTRALINE HCL 100 MG PO TABS: 150.0000 mg | ORAL_TABLET | Freq: Every day | ORAL | 1 refills | Status: DC

## 2019-07-17 NOTE — Progress Notes (Signed)
There were no vitals taken for this visit.   Subjective:    Patient ID: Laura Rojas, female    DOB: 1946/10/20, 73 y.o.   MRN: [id number]  HPI: Laura Rojas is a 73 y.o. female  Med check Patient followed by rheumatology which is stopped her prednisone.  Arthritis seems to be stable has follow-up appointment in September with rheumatology. Diabetes also following patient has appointment in October and is doing adequate. Blood pressure cholesterol all seems to be stable. Depression with episodes of worsening episodes every 2 to 3 days that seems to last all day.  Relevant past medical, surgical, family and social history reviewed and updated as indicated. Interim medical history since our last visit reviewed. Allergies and medications reviewed and updated.  Review of Systems  Constitutional: Negative.   Respiratory: Negative.   Cardiovascular: Negative.     Per HPI unless specifically indicated above     Objective:    There were no vitals taken for this visit.  Wt Readings from Last 3 Encounters:  12/17/18 174 lb (78.9 kg)  10/15/18 178 lb 6.4 oz (80.9 kg)  08/28/18 177 lb 9.6 oz (80.6 kg)    Physical Exam  Results for orders placed or performed in visit on 05/08/19  LP+ALT+AST Piccolo, [employer]  Result Value Ref Range   ALT (SGPT) Piccolo, Waived 29 10 - 47 U/L   AST (SGOT) Piccolo, Waived 26 11 - 38 U/L   Cholesterol Piccolo, Waived 170 <200 mg/dL   HDL Chol Piccolo, Waived 55 (L) >59 mg/dL   Triglycerides Piccolo,Waived 367 (H) <150 mg/dL   Chol/HDL Ratio Piccolo,Waive 3.1 mg/dL   LDL Chol Calc Piccolo Waived 41 <100 mg/dL   VLDL Chol Calc Piccolo,Waive 73 (H) <30 mg/dL  Basic metabolic panel  Result Value Ref Range   Glucose 275 (H) 65 - 99 mg/dL   BUN 19 8 - 27 mg/dL   Creatinine, Ser 0.98 0.57 - 1.00 mg/dL   GFR calc non Af Amer 57 (L) >59 mL/min/1.73   GFR calc Af Amer 66 >59 mL/min/1.73   BUN/Creatinine Ratio 19 12 - 28   Sodium 139 134 - 144 mmol/L    Potassium 4.2 3.5 - 5.2 mmol/L   Chloride 100 96 - 106 mmol/L   CO2 25 20 - 29 mmol/L   Calcium 9.7 8.7 - 10.3 mg/dL      Assessment & Plan:   Problem List Items Addressed This Visit      Cardiovascular and Mediastinum   HTN (hypertension)    The current medical regimen is effective;  continue present plan and medications.       Coronary artery disease of bypass graft of native heart with stable angina pectoris [facility])    The current medical regimen is effective;  continue present plan and medications.         Musculoskeletal and Integument   Rheu arthritis w rheu factor of unsp site w/o org/sys involv (HCC)    Followed by rheumatology and stable        Other   Depression, recurrent ([place])    Discussed depression worsening will increase Zoloft from 100 mg to 150 mg will break 100 mg in half      Relevant Medications   sertraline (ZOLOFT) 100 MG tablet       Telemedicine using audio/video telecommunications for a synchronous communication visit. Today's visit due to COVID-19 isolation precautions I connected with and verified that I am speaking with the correct person  using two identifiers.   I discussed the limitations, risks, security and privacy concerns of performing an evaluation and management service by telecommunication and the availability of in person appointments. I also discussed with the patient that there may be a patient responsible charge related to this service. The patient expressed understanding and agreed to proceed. The patient's location is home. I am at home.   I discussed the assessment and treatment plan with the patient. The patient was provided an opportunity to ask questions and all were answered. The patient agreed with the plan and demonstrated an understanding of the instructions.   The patient was advised to call back or seek an in-person evaluation if the symptoms worsen or if the condition fails to improve as anticipated.   I provided  21+ minutes of time during this encounter. Follow up plan: Return for Physical Exam.

## 2019-07-17 NOTE — Assessment & Plan Note (Signed)
Followed by rheumatology and stable

## 2019-07-17 NOTE — Assessment & Plan Note (Signed)
The current medical regimen is effective;  continue present plan and medications.  

## 2019-07-17 NOTE — Assessment & Plan Note (Signed)
Discussed depression worsening will increase Zoloft from 100 mg to 150 mg will break 100 mg in half

## 2019-07-31 ENCOUNTER — Telehealth: Payer: Self-pay | Admitting: Family Medicine

## 2019-07-31 DIAGNOSIS — Z9889 Other specified postprocedural states: Secondary | ICD-10-CM

## 2019-07-31 NOTE — Telephone Encounter (Signed)
Copied from [place] [id number]. Topic: General - Other >> Jul 31, 2019  3:47 PM Keene Breath wrote: Reason for CRM: Patient called to ask if the doctor could give her a referral for her colonoscopy at the same facility where she previously had one done.  Please have the nurse call to discuss.  CB# [phone] or [phone]

## 2019-07-31 NOTE — Telephone Encounter (Signed)
Patient was last seen for colonoscopy with Dr. Allen Norris at [facility] in 2010. Routing to provider for referral.

## 2019-07-31 NOTE — Telephone Encounter (Signed)
Routing to provider  

## 2019-08-05 ENCOUNTER — Telehealth: Payer: Self-pay

## 2019-08-05 NOTE — Telephone Encounter (Signed)
LVM for pt to call office in regards to scheduling colonoscopy with Dr. Allen Norris.  Thanks [employer]

## 2019-08-06 ENCOUNTER — Telehealth: Payer: Self-pay

## 2019-08-06 ENCOUNTER — Other Ambulatory Visit: Payer: Self-pay

## 2019-08-06 DIAGNOSIS — Z1211 Encounter for screening for malignant neoplasm of colon: Secondary | ICD-10-CM

## 2019-08-06 MED ORDER — NA SULFATE-K SULFATE-MG SULF 17.5-3.13-1.6 GM/177ML PO SOLN: 1.0000 | Freq: Once | ORAL | 0 refills | Status: AC

## 2019-08-06 NOTE — Telephone Encounter (Signed)
Gastroenterology Pre-Procedure Review  Request Date: 08/19/19 Requesting Physician: Dr. Allen Norris  PATIENT REVIEW QUESTIONS: The patient responded to the following health history questions as indicated:    1. Are you having any GI issues? no 2. Do you have a personal history of Polyps? no 3. Do you have a family history of Colon Cancer or Polyps? no 4. Diabetes Mellitus? yes (oral meds) 5. Joint replacements in the past 12 months?no 6. Major health problems in the past 3 months?no 7. Any artificial heart valves, MVP, or defibrillator?no    MEDICATIONS & ALLERGIES:    Patient reports the following regarding taking any anticoagulation/antiplatelet therapy:   Plavix, Coumadin, Eliquis, Xarelto, Lovenox, Pradaxa, Brilinta, or Effient? yes (Plavix blood thinner request sent to Dr. Jeananne Rama) Aspirin? yes (81 mg daily)  Patient confirms/reports the following medications:  Current Outpatient Medications  Medication Sig Dispense Refill  . amLODipine (NORVASC) 2.5 MG tablet Take 1 tablet (2.5 mg total) by mouth daily. 90 tablet 3  . aspirin 81 MG tablet Take 81 mg by mouth daily.    . betamethasone dipropionate (DIPROLENE) 0.05 % cream Apply topically 2 (two) times daily. 45 g 0  . carvedilol (COREG) 3.125 MG tablet Take 1 tablet (3.125 mg total) by mouth 2 (two) times daily. 180 tablet 3  . carvedilol (COREG) 3.125 MG tablet Take 1 tablet (3.125 mg total) by mouth 2 (two) times daily. 180 tablet 3  . clopidogrel (PLAVIX) 75 MG tablet Take 1 tablet (75 mg total) by mouth daily. 90 tablet 4  . colchicine 0.6 MG tablet Take 1 tablet (0.6 mg total) by mouth daily. 30 tablet 3  . Exenatide (BYDUREON BCISE [place]) Inject 38 Units into the skin daily.    . ferrous sulfate 325 (65 FE) MG tablet Take 325 mg by mouth daily with breakfast.    . hydrochlorothiazide (MICROZIDE) 12.5 MG capsule Take 1 capsule (12.5 mg total) by mouth daily. 90 capsule 4  . LORazepam (ATIVAN) 0.5 MG tablet Take 1 tablet (0.5 mg  total) by mouth 2 (two) times daily. Use the additional 30 tabs only sparingly and only for severe anxiety 180 tablet 1  . losartan (COZAAR) 100 MG tablet Take 1 tablet (100 mg total) by mouth daily. 90 tablet 4  . Magnesium 400 MG TABS Take 1 tablet by mouth daily.    . metFORMIN (GLUCOPHAGE) 1000 MG tablet Take 500 mg by mouth 2 (two) times daily.     Marland Kitchen NOVOLOG 100 UNIT/ML injection Inject 10 Units into the skin 3 (three) times daily.  3  . Omega-3 Fatty Acids (FISH OIL) 1200 MG CAPS Take 1,200 mg by mouth daily.    . pantoprazole (PROTONIX) 40 MG tablet Take 1 tablet (40 mg total) by mouth daily. 90 tablet 4  . sertraline (ZOLOFT) 100 MG tablet Take 1.5 tablets (150 mg total) by mouth daily. 135 tablet 1  . simvastatin (ZOCOR) 40 MG tablet Take 1 tablet (40 mg total) by mouth daily at 6 PM. 90 tablet 4   No current facility-administered medications for this visit.     Patient confirms/reports the following allergies:  Allergies  Allergen Reactions  . Compazine [Prochlorperazine Edisylate] Other (See Comments)    choking  . Altace [Ramipril]   . Crestor [Rosuvastatin Calcium] Other (See Comments)    Myalgias  . Reglan [Metoclopramide]   . Sulfa Antibiotics Other (See Comments)    headache  . Actos [Pioglitazone] Other (See Comments)    Hot Flashes  . Amlodipine Swelling  .  Latex Rash  . Lipitor [Atorvastatin] Other (See Comments)    Cramps    No orders of the defined types were placed in this encounter.   AUTHORIZATION INFORMATION Primary Insurance: 1D#: Group #:  Secondary Insurance: 1D#: Group #:  SCHEDULE INFORMATION: Date: 08/19/19 Time: Location:[facility]

## 2019-08-07 ENCOUNTER — Encounter: Payer: Self-pay | Admitting: Cardiovascular Disease

## 2019-08-07 ENCOUNTER — Ambulatory Visit (INDEPENDENT_AMBULATORY_CARE_PROVIDER_SITE_OTHER): Payer: Medicare Other | Admitting: Cardiovascular Disease

## 2019-08-07 ENCOUNTER — Other Ambulatory Visit: Payer: Self-pay

## 2019-08-07 VITALS — BP 140/71 | HR 56 | Temp 97.1°F | Ht 65.0 in | Wt 168.0 lb

## 2019-08-07 DIAGNOSIS — E1151 Type 2 diabetes mellitus with diabetic peripheral angiopathy without gangrene: Secondary | ICD-10-CM | POA: Diagnosis not present

## 2019-08-07 DIAGNOSIS — I771 Stricture of artery: Secondary | ICD-10-CM

## 2019-08-07 DIAGNOSIS — I25118 Atherosclerotic heart disease of native coronary artery with other forms of angina pectoris: Secondary | ICD-10-CM

## 2019-08-07 DIAGNOSIS — I10 Essential (primary) hypertension: Secondary | ICD-10-CM | POA: Diagnosis not present

## 2019-08-07 DIAGNOSIS — I25708 Atherosclerosis of coronary artery bypass graft(s), unspecified, with other forms of angina pectoris: Secondary | ICD-10-CM

## 2019-08-07 DIAGNOSIS — E782 Mixed hyperlipidemia: Secondary | ICD-10-CM | POA: Diagnosis not present

## 2019-08-07 DIAGNOSIS — I495 Sick sinus syndrome: Secondary | ICD-10-CM | POA: Diagnosis not present

## 2019-08-07 NOTE — Patient Instructions (Signed)
Medication Instructions:  Your physician has recommended you make the following change in your medication:   STOP TAKING YOUR CARVEDILOL (COREG).  If you need a refill on your cardiac medications before your next appointment, please call your pharmacy.   Lab work: NONE If you have labs (blood work) drawn today and your tests are completely normal, you will receive your results only by: Marland Kitchen MyChart Message (if you have MyChart) OR . A paper copy in the mail If you have any lab test that is abnormal or we need to change your treatment, we will call you to review the results.  Testing/Procedures: NONE  Follow-Up: At [facility], you and your health needs are our priority.  As part of our continuing mission to provide you with exceptional heart care, we have created designated Provider Care Teams.  These Care Teams include your primary Cardiologist (physician) and Advanced Practice Providers (APPs -  Physician Assistants and Nurse Practitioners) who all work together to provide you with the care you need, when you need it. You will need a follow up appointment in 12 months.  Please call our office 2 months in advance to schedule this appointment.  You may see Dr. Sallyanne Kuster or one of the following Advanced Practice Providers on your designated Care Team: Almyra Deforest, [place] . Fabian Sharp, PA-C  Any Other Special Instructions Will Be Listed Below (If Applicable).  Please keep a daily blood pressure log and submit the blood pressure readings/results through your MyChart account.  Dr. Sallyanne Kuster would like you to check your blood pressure using your RIGHT ARM.

## 2019-08-07 NOTE — Progress Notes (Signed)
Patient ID: Laura Rojas, female   DOB: 03-May-1946, 73 y.o.   MRN: [id number]    Cardiology Office Note    Date:  08/07/2019   ID:  Marka, Notch 11-06-1946, MRN [id number]  PCP:  Guadalupe Maple, MD  Cardiologist:   Sanda Klein, MD   Chief complaint: Weakness and fatigue   History of Present Illness:  Laura Rojas is a 73 y.o. female with coronary disease, PAD (suspected left subclavian stenosis), DM, HTN and hyperlipidemia, polymyalgia rheumatica.  Since March she has been working on her treadmill 6 days a week for 25 minutes each time, without complaints of angina or dyspnea.  She does complain of fatigue which is particularly prominent in the afternoons.  She has not had any recent gout attacks.  She denies intermittent claudication in the upper or the lower extremities.  She has not had dizziness, palpitations or syncope and denies lower extremity edema (she did have ankle swelling when she took higher doses of amlodipine).  We have been gradually cutting back her dose of beta-blocker over the last couple of years due to bradycardia and fatigue.  She denies problems with falls, unsteady gait or bleeding issues.  Cholesterol and LDL levels remain in acceptable range, but she had significant worsening of her hypertriglyceridemia labs performed a couple of months ago.  This seems to be related to worse glycemic control.  Hemoglobin A1c was 9.6%.  She had stents placed in the proximal LAD and proximal RCA (2007, 3.0 x 28 mm drug-eluting Cypher stent to RCA, 3.5 x 33 mm drug-eluting Cypher stent and proximal LAD). She is known to have an untreated stenosis in the very distal portion of the LAD and a 60% "jailed" ostial diagonal stenosis at the level of the proximal LAD stent. She has preserved LV systolic function and has never had congestive heart failure. Her most recent functional study was a nuclear stress test in 2013 that showed normal perfusion and normal LVEF. Her most  recent cardiac catheterization was performed in 2009 and both the LAD and RCA stents were found to be widely patent. Significant comorbidities include diabetes mellitus type 2, hyperlipidemia and hypertension.    Past Medical History:  Diagnosis Date  . CAD (coronary artery disease)   . CAD (coronary artery disease) 12/31/2013   2007, 3.0 x 28 mm drug-eluting Cypher stent to RCA, 3.5 x 33 mm drug-eluting Cypher stent and proximal LAD) Consent by 2009 Normal perfusion by mildly 2013   . Dyslipidemia   . Polymyalgia rheumatica syndrome ([place])   . Subclavian artery stenosis, left ([place]) 01/01/2015    Past Surgical History:  Procedure Laterality Date  . BREAST BIOPSY Right 1970   EXCISIONAL - NEG  . BREAST BIOPSY Left 1973   EXCISIONAL - NEG  . CARDIAC CATHETERIZATION  08/25/2008   patent stents  . CAROTID DUPLEX  10/2006   NORMAL CAROTID ARTERY DUPLEX  . CORONARY ANGIOPLASTY WITH STENT PLACEMENT  09/17/2006   PTCA & stenting LAD  . ESOPHAGOGASTRODUODENOSCOPY N/A 04/27/2015   Procedure: ESOPHAGOGASTRODUODENOSCOPY (EGD);  Surgeon: Lucilla Lame, MD;  Location: [facility] ENDOSCOPY;  Service: Endoscopy;  Laterality: N/A;  . NM MYOCAR MULTIPLE W/SPECT  11/2011   EF 72%.NORMAL MYOCARDIAL PERFUSION STUDY  . REPLACEMENT TOTAL KNEE  2003 & 2004  . TRANSTHORACIC ECHOCARDIOGRAM  09/2006   EF =>55%. IMPAIRED LV RELAXATION. MILD MITRAL ANNULAR CALCIFICATION. AV-MILDLY SCLEROTIC. NO AS.  Marland Kitchen VAGINAL HYSTERECTOMY  1983    Current Medications: Outpatient Medications Prior  to Visit  Medication Sig Dispense Refill  . amLODipine (NORVASC) 2.5 MG tablet Take 1 tablet (2.5 mg total) by mouth daily. 90 tablet 3  . aspirin 81 MG tablet Take 81 mg by mouth daily.    . betamethasone dipropionate (DIPROLENE) 0.05 % cream Apply topically 2 (two) times daily. 45 g 0  . clopidogrel (PLAVIX) 75 MG tablet Take 1 tablet (75 mg total) by mouth daily. 90 tablet 4  . colchicine 0.6 MG tablet Take 1 tablet (0.6 mg total) by mouth  daily. 30 tablet 3  . Exenatide (BYDUREON BCISE [place]) Inject 38 Units into the skin daily.    . ferrous sulfate 325 (65 FE) MG tablet Take 325 mg by mouth daily with breakfast.    . hydrochlorothiazide (MICROZIDE) 12.5 MG capsule Take 1 capsule (12.5 mg total) by mouth daily. 90 capsule 4  . Insulin Glargine (BASAGLAR KWIKPEN) 100 UNIT/ML SOPN 16 Units.    Marland Kitchen LORazepam (ATIVAN) 0.5 MG tablet Take 1 tablet (0.5 mg total) by mouth 2 (two) times daily. Use the additional 30 tabs only sparingly and only for severe anxiety 180 tablet 1  . losartan (COZAAR) 100 MG tablet Take 1 tablet (100 mg total) by mouth daily. 90 tablet 4  . Magnesium 400 MG TABS Take 1 tablet by mouth daily.    . metFORMIN (GLUCOPHAGE) 1000 MG tablet Take 500 mg by mouth 2 (two) times daily.     Marland Kitchen NOVOLOG 100 UNIT/ML injection Inject 10 Units into the skin 3 (three) times daily.  3  . Omega-3 Fatty Acids (FISH OIL) 1200 MG CAPS Take 1,200 mg by mouth daily.    . pantoprazole (PROTONIX) 40 MG tablet Take 1 tablet (40 mg total) by mouth daily. 90 tablet 4  . sertraline (ZOLOFT) 100 MG tablet Take 1.5 tablets (150 mg total) by mouth daily. 135 tablet 1  . simvastatin (ZOCOR) 40 MG tablet Take 1 tablet (40 mg total) by mouth daily at 6 PM. 90 tablet 4  . carvedilol (COREG) 3.125 MG tablet Take 1 tablet (3.125 mg total) by mouth 2 (two) times daily. 180 tablet 3  . carvedilol (COREG) 3.125 MG tablet Take 1 tablet (3.125 mg total) by mouth 2 (two) times daily. 180 tablet 3   No facility-administered medications prior to visit.      Allergies:   Compazine [prochlorperazine edisylate], Altace [ramipril], Crestor [rosuvastatin calcium], Reglan [metoclopramide], Sulfa antibiotics, Actos [pioglitazone], Amlodipine, Latex, and Lipitor [atorvastatin]   Social History   Socioeconomic History  . Marital status: Married    Spouse name: Not on file  . Number of children: Not on file  . Years of education: Not on file  . Highest education  level: Some college, no degree  Occupational History  . Not on file  Social Needs  . Financial resource strain: Not hard at all  . Food insecurity    Worry: Never true    Inability: Never true  . Transportation needs    Medical: No    Non-medical: No  Tobacco Use  . Smoking status: Never Smoker  . Smokeless tobacco: Never Used  Substance and Sexual Activity  . Alcohol use: No    Alcohol/week: 0.0 standard drinks  . Drug use: No  . Sexual activity: Not on file  Lifestyle  . Physical activity    Days per week: 0 days    Minutes per session: 0 min  . Stress: Not at all  Relationships  . Social connections  Talks on phone: More than three times a week    Gets together: More than three times a week    Attends religious service: More than 4 times per year    Active member of club or organization: Yes    Attends meetings of clubs or organizations: More than 4 times per year    Relationship status: Married  Other Topics Concern  . Not on file  Social History Narrative  . Not on file     Family History:  The patient's family history includes Brain cancer in her grandchild; Breast cancer (age of onset: 31) in her mother; Diabetes in her sister; Heart disease in her maternal grandfather and maternal grandmother.   ROS:   Please see the history of present illness.    ROS all other systems are reviewed and are negative  PHYSICAL EXAM:   VS:  BP 140/71   Pulse (!) 56   Temp (!) 97.1 F (36.2 C)   Ht 5\' 5"  (1.651 m)   Wt 168 lb (76.2 kg)   SpO2 97%   BMI 27.96 kg/m     General: Alert, oriented x3, no distress, borderline obese Head: no evidence of trauma, PERRL, EOMI, no exophtalmos or lid lag, no myxedema, no xanthelasma; normal ears, nose and oropharynx Neck: normal jugular venous pulsations and no hepatojugular reflux; brisk carotid pulses without delay and no carotid bruits Chest: clear to auscultation, no signs of consolidation by percussion or palpation, normal  fremitus, symmetrical and full respiratory excursions Cardiovascular: normal position and quality of the apical impulse, regular rhythm, normal first and second heart sounds, no murmurs, rubs or gallops Abdomen: no tenderness or distention, no masses by palpation, no abnormal pulsatility or arterial bruits, normal bowel sounds, no hepatosplenomegaly Extremities: no clubbing, cyanosis or edema; 2+ radial, ulnar and brachial pulses bilaterally; 2+ right femoral, posterior tibial and dorsalis pedis pulses; 2+ left femoral, posterior tibial and dorsalis pedis pulses; no subclavian or femoral bruits Neurological: grossly nonfocal Psych: Normal mood and affect   Wt Readings from Last 3 Encounters:  08/07/19 168 lb (76.2 kg)  12/17/18 174 lb (78.9 kg)  10/15/18 178 lb 6.4 oz (80.9 kg)      Studies/Labs Reviewed:   EKG:  EKG is ordered today.  It shows sinus bradycardia with generalized low voltage QRS and flat T waves throughout, QTC around 400 ms.  Recent Labs: 08/28/2018: Hemoglobin 12.9; Platelets 255; TSH 3.590 05/08/2019: ALT (SGPT) [place], Waived 29; BUN 19; Creatinine, Ser 0.98; Potassium 4.2; Sodium 139   Lipid Panel    Component Value Date/Time   CHOL 170 05/08/2019 0939   TRIG 367 (H) 05/08/2019 0939   HDL 57 08/28/2018 1054   CHOLHDL 2.9 08/28/2018 1054   VLDL 73 (H) 05/08/2019 0939   LDLCALC 74 08/28/2018 1054    ASSESSMENT:    1. Coronary artery disease of native artery of native heart with stable angina pectoris ([place])   2. Essential hypertension   3. SSS (sick sinus syndrome) ([place])   4. Diabetes mellitus with peripheral vascular disease ([place])   5. Mixed hyperlipidemia   6. Stenosis of left subclavian artery ([facility])      PLAN:  In order of problems listed above:  1. CAD: She does not have angina pectoris, despite exercising on a daily basis.  Currently on 2 antianginals: Amlodipine and carvedilol, but having some problems with bradycardia and fatigue.  We will stop  the carvedilol and monitor for angina recurrence. 2. HTN: Fair control.  After stopping the carvedilol she will send [place] some readings through [email or web address]. 3. SSS: Suspect that her bradycardia is due to some degree of sinus node dysfunction, with worsened chronotropic incompetence due to the beta-blocker.  May need a pacemaker in the future. 4. DM: followed in the endocrinology clinic at [facility]  Glycemic control has been suboptimal recently, which probably explains her hypertriglyceridemia. 5. HLP: LDL is well higher than it was a year ago but remains in acceptable range.  Hypertriglyceridemia is expected to improve if she has better glycemic control. 6. PAD: She does not have intermittent claudication.  There was no evidence of significant carotid or subclavian stenosis on duplex ultrasonography performed in 2016 and there was antegrade flow in the left vertebral artery. Her blood pressure should always be checked on the right side (roughly 15 mmHg gradient compared to left).     Medication Adjustments/Labs and Tests Ordered: Current medicines are reviewed at length with the patient today.  Concerns regarding medicines are outlined above.  Medication changes, Labs and Tests ordered today are listed in the Patient Instructions below. Patient Instructions  Medication Instructions:  Your physician has recommended you make the following change in your medication:   STOP TAKING YOUR CARVEDILOL (COREG).  If you need a refill on your cardiac medications before your next appointment, please call your pharmacy.   Lab work: NONE If you have labs (blood work) drawn today and your tests are completely normal, you will receive your results only by: Marland Kitchen MyChart Message (if you have MyChart) OR . A paper copy in the mail If you have any lab test that is abnormal or we need to change your treatment, we will call you to review the results.  Testing/Procedures: NONE  Follow-Up: At [facility], you and  your health needs are our priority.  As part of our continuing mission to provide you with exceptional heart care, we have created designated Provider Care Teams.  These Care Teams include your primary Cardiologist (physician) and Advanced Practice Providers (APPs -  Physician Assistants and Nurse Practitioners) who all work together to provide you with the care you need, when you need it. You will need a follow up appointment in 12 months.  Please call our office 2 months in advance to schedule this appointment.  You may see Dr. Sallyanne Kuster or one of the following Advanced Practice Providers on your designated Care Team: Almyra Deforest, [place] . Fabian Sharp, PA-C  Any Other Special Instructions Will Be Listed Below (If Applicable).  Please keep a daily blood pressure log and submit the blood pressure readings/results through your MyChart account.  Dr. Sallyanne Kuster would like you to check your blood pressure using your RIGHT ARM.      Signed, Sanda Klein, MD  08/07/2019 8:29 AM    [facility] [address], [place], [place]  [address] Phone: [phone]; Fax: [phone]

## 2019-08-15 ENCOUNTER — Other Ambulatory Visit
Admission: RE | Admit: 2019-08-15 | Discharge: 2019-08-15 | Disposition: A | Discharge status: Home / Self Care | Payer: Medicare Other | Source: Ambulatory Visit | Attending: Gastroenterology | Admitting: Gastroenterology

## 2019-08-15 ENCOUNTER — Other Ambulatory Visit: Payer: Self-pay

## 2019-08-15 DIAGNOSIS — Z20828 Contact with and (suspected) exposure to other viral communicable diseases: Secondary | ICD-10-CM | POA: Insufficient documentation

## 2019-08-15 DIAGNOSIS — Z01812 Encounter for preprocedural laboratory examination: Secondary | ICD-10-CM | POA: Insufficient documentation

## 2019-08-15 LAB — SARS CORONAVIRUS 2 (TAT 6-24 HRS): SARS Coronavirus 2: NEGATIVE

## 2019-08-18 ENCOUNTER — Telehealth: Payer: Self-pay | Admitting: Gastroenterology

## 2019-08-18 ENCOUNTER — Ambulatory Visit
Admission: RE | Admit: 2019-08-18 | Discharge: 2019-08-18 | Disposition: A | Discharge status: Home / Self Care | Payer: Medicare Other | Source: Ambulatory Visit | Attending: Family Medicine | Admitting: Family Medicine

## 2019-08-18 DIAGNOSIS — Z1231 Encounter for screening mammogram for malignant neoplasm of breast: Secondary | ICD-10-CM | POA: Insufficient documentation

## 2019-08-18 NOTE — Telephone Encounter (Signed)
Called pt she is aware ok to drink soda and Husband has to wait outside due to [place] restrictions

## 2019-08-18 NOTE — Telephone Encounter (Signed)
Pt left vm she would like to know if she can drink soda coke or pepsi for her procedure tomorrow and also if her husband is allowed with her

## 2019-08-19 ENCOUNTER — Encounter: Payer: Self-pay | Admitting: *Deleted

## 2019-08-19 ENCOUNTER — Other Ambulatory Visit: Payer: Self-pay

## 2019-08-19 ENCOUNTER — Ambulatory Visit: Payer: Medicare Other | Admitting: Anesthesiology

## 2019-08-19 ENCOUNTER — Ambulatory Visit
Admission: RE | Admit: 2019-08-19 | Discharge: 2019-08-19 | Disposition: A | Discharge status: Home / Self Care | Payer: Medicare Other | Attending: Gastroenterology | Admitting: Gastroenterology

## 2019-08-19 ENCOUNTER — Encounter
Admission: RE | Disposition: A | Discharge status: Home / Self Care | Payer: Self-pay | Source: Home / Self Care | Attending: Gastroenterology

## 2019-08-19 DIAGNOSIS — I10 Essential (primary) hypertension: Secondary | ICD-10-CM | POA: Diagnosis not present

## 2019-08-19 DIAGNOSIS — Z7982 Long term (current) use of aspirin: Secondary | ICD-10-CM | POA: Insufficient documentation

## 2019-08-19 DIAGNOSIS — I2510 Atherosclerotic heart disease of native coronary artery without angina pectoris: Secondary | ICD-10-CM | POA: Insufficient documentation

## 2019-08-19 DIAGNOSIS — E785 Hyperlipidemia, unspecified: Secondary | ICD-10-CM | POA: Insufficient documentation

## 2019-08-19 DIAGNOSIS — Z7902 Long term (current) use of antithrombotics/antiplatelets: Secondary | ICD-10-CM | POA: Insufficient documentation

## 2019-08-19 DIAGNOSIS — I129 Hypertensive chronic kidney disease with stage 1 through stage 4 chronic kidney disease, or unspecified chronic kidney disease: Secondary | ICD-10-CM | POA: Diagnosis not present

## 2019-08-19 DIAGNOSIS — K640 First degree hemorrhoids: Secondary | ICD-10-CM | POA: Insufficient documentation

## 2019-08-19 DIAGNOSIS — D649 Anemia, unspecified: Secondary | ICD-10-CM | POA: Insufficient documentation

## 2019-08-19 DIAGNOSIS — D125 Benign neoplasm of sigmoid colon: Secondary | ICD-10-CM | POA: Insufficient documentation

## 2019-08-19 DIAGNOSIS — Z794 Long term (current) use of insulin: Secondary | ICD-10-CM | POA: Diagnosis not present

## 2019-08-19 DIAGNOSIS — Z1211 Encounter for screening for malignant neoplasm of colon: Secondary | ICD-10-CM | POA: Diagnosis not present

## 2019-08-19 DIAGNOSIS — Z955 Presence of coronary angioplasty implant and graft: Secondary | ICD-10-CM | POA: Insufficient documentation

## 2019-08-19 DIAGNOSIS — E119 Type 2 diabetes mellitus without complications: Secondary | ICD-10-CM | POA: Diagnosis not present

## 2019-08-19 DIAGNOSIS — D123 Benign neoplasm of transverse colon: Secondary | ICD-10-CM

## 2019-08-19 DIAGNOSIS — K635 Polyp of colon: Secondary | ICD-10-CM

## 2019-08-19 DIAGNOSIS — Z79899 Other long term (current) drug therapy: Secondary | ICD-10-CM | POA: Diagnosis not present

## 2019-08-19 DIAGNOSIS — E1122 Type 2 diabetes mellitus with diabetic chronic kidney disease: Secondary | ICD-10-CM | POA: Diagnosis not present

## 2019-08-19 DIAGNOSIS — F419 Anxiety disorder, unspecified: Secondary | ICD-10-CM | POA: Diagnosis not present

## 2019-08-19 DIAGNOSIS — N183 Chronic kidney disease, stage 3 (moderate): Secondary | ICD-10-CM | POA: Diagnosis not present

## 2019-08-19 DIAGNOSIS — D126 Benign neoplasm of colon, unspecified: Secondary | ICD-10-CM | POA: Diagnosis not present

## 2019-08-19 DIAGNOSIS — Z96659 Presence of unspecified artificial knee joint: Secondary | ICD-10-CM | POA: Insufficient documentation

## 2019-08-19 HISTORY — DX: Type 2 diabetes mellitus without complications: E11.9

## 2019-08-19 HISTORY — PX: COLONOSCOPY WITH PROPOFOL: SHX5780

## 2019-08-19 LAB — GLUCOSE, CAPILLARY: Glucose-Capillary: 241 mg/dL — ABNORMAL HIGH (ref 70–99)

## 2019-08-19 SURGERY — COLONOSCOPY WITH PROPOFOL
Anesthesia: General

## 2019-08-19 MED ORDER — SODIUM CHLORIDE 0.9 % IV SOLN
INTRAVENOUS | Status: DC
  Administered 2019-08-19: 1000 mL via INTRAVENOUS

## 2019-08-19 MED ORDER — PROPOFOL 500 MG/50ML IV EMUL
INTRAVENOUS | Status: DC | PRN
  Administered 2019-08-19: 140 ug/kg/min via INTRAVENOUS

## 2019-08-19 MED ORDER — PROPOFOL 10 MG/ML IV BOLUS
INTRAVENOUS | Status: DC | PRN
  Administered 2019-08-19: 20 mg via INTRAVENOUS
  Administered 2019-08-19: 50 mg via INTRAVENOUS

## 2019-08-19 NOTE — H&P (Signed)
Lucilla Lame, MD [address] [place], [place] [address] Phone: [phone] Fax : [phone]  Primary Care Physician:  Guadalupe Maple, MD Primary Gastroenterologist:  Dr. Allen Norris  Pre-Procedure History & Physical: HPI:  Laura Rojas is a 73 y.o. female is here for a screening colonoscopy.   Past Medical History:  Diagnosis Date  . CAD (coronary artery disease)   . CAD (coronary artery disease) 12/31/2013   2007, 3.0 x 28 mm drug-eluting Cypher stent to RCA, 3.5 x 33 mm drug-eluting Cypher stent and proximal LAD) Consent by 2009 Normal perfusion by mildly 2013   . Diabetes mellitus without complication ([place])   . Dyslipidemia   . Polymyalgia rheumatica syndrome ([place])   . Subclavian artery stenosis, left ([place]) 01/01/2015    Past Surgical History:  Procedure Laterality Date  . BREAST EXCISIONAL BIOPSY Right 1970   EXCISIONAL - NEG  . BREAST EXCISIONAL BIOPSY Left 1973   EXCISIONAL - NEG  . BREAST SURGERY    . CARDIAC CATHETERIZATION  08/25/2008   patent stents  . CAROTID DUPLEX  10/2006   NORMAL CAROTID ARTERY DUPLEX  . CORONARY ANGIOPLASTY WITH STENT PLACEMENT  09/17/2006   PTCA & stenting LAD  . ESOPHAGOGASTRODUODENOSCOPY N/A 04/27/2015   Procedure: ESOPHAGOGASTRODUODENOSCOPY (EGD);  Surgeon: Lucilla Lame, MD;  Location[facility] END[facility]COPY;  Service: Endoscopy;  Laterality: N/A;  . JOINT REPLACEMENT    . NM MYOCAR MULTIPLE W/SPECT  11/2011   EF 72%.NORMAL MYOCARDIAL PERFUSION STUDY  . REPLACEMENT TOTAL KNEE  2003 & 2004  . TRANSTHORACIC ECHOCARDIOGRAM  09/2006   EF =>55%. IMPAIRED LV RELAXATION. MILD MITRAL ANNULAR CALCIFICATION. AV-MILDLY SCLEROTIC. NO AS.  Marland Kitchen VAGINAL HYSTERECTOMY  1983    Prior to Admission medications   Medication Sig Start Date End Date Taking? Authorizing Provider  amLODipine (NORVASC) 2.5 MG tablet Take 1 tablet (2.5 mg total) by mouth daily. 09/06/18  Yes Croitoru, Mihai, MD  aspirin 81 MG tablet Take 81 mg by mouth daily.   Yes  [provider]  clopidogrel (PLAVIX) 75 MG tablet Take 1 tablet (75 mg total) by mouth daily. 08/28/18  Yes Crissman, Jeannette How, MD  Exenatide (BYDUREON BCISE [place]) Inject 38 Units into the skin daily.   Yes [provider]  ferrous sulfate 325 (65 FE) MG tablet Take 325 mg by mouth daily with breakfast.   Yes [provider]  hydrochlorothiazide (MICROZIDE) 12.5 MG capsule Take 1 capsule (12.5 mg total) by mouth daily. 08/28/18  Yes Crissman, Jeannette How, MD  Insulin Glargine (BASAGLAR KWIKPEN) 100 UNIT/ML SOPN 16 Units. 06/29/19  Yes [provider]  LORazepam (ATIVAN) 0.5 MG tablet Take 1 tablet (0.5 mg total) by mouth 2 (two) times daily. Use the additional 30 tabs only sparingly and only for severe anxiety 05/07/19  Yes Crissman, Jeannette How, MD  losartan (COZAAR) 100 MG tablet Take 1 tablet (100 mg total) by mouth daily. 08/28/18  Yes Crissman, Jeannette How, MD  Magnesium 400 MG TABS Take 1 tablet by mouth daily.   Yes [provider]  metFORMIN (GLUCOPHAGE) 1000 MG tablet Take 500 mg by mouth 2 (two) times daily.  08/02/16  Yes [provider]  NOVOLOG 100 UNIT/ML injection Inject 10 Units into the skin 3 (three) times daily. 10/07/18  Yes [provider]  Omega-3 Fatty Acids (FISH OIL) 1200 MG CAPS Take 1,200 mg by mouth daily.   Yes [provider]  pantoprazole (PROTONIX) 40 MG tablet Take 1 tablet (40 mg  total) by mouth daily. 08/28/18  Yes Guadalupe Maple, MD  simvastatin (ZOCOR) 40 MG tablet Take 1 tablet (40 mg total) by mouth daily at 6 PM. 08/28/18  Yes Crissman, Jeannette How, MD  betamethasone dipropionate (DIPROLENE) 0.05 % cream Apply topically 2 (two) times daily. Patient not taking: Reported on 08/19/2019 08/23/17   Guadalupe Maple, MD  colchicine 0.6 MG tablet Take 1 tablet (0.6 mg total) by mouth daily. Patient not taking: Reported on 08/19/2019 03/06/18   Guadalupe Maple, MD  sertraline (ZOLOFT) 100 MG tablet Take 1.5 tablets (150 mg total)  by mouth daily. Patient not taking: Reported on 08/19/2019 07/17/19   Guadalupe Maple, MD    Allergies as of 08/06/2019 - Review Complete 07/17/2019  Allergen Reaction Noted  . Compazine [prochlorperazine edisylate] Other (See Comments) 12/31/2013  . Altace [ramipril]  05/26/2015  . Crestor [rosuvastatin calcium] Other (See Comments) 05/26/2015  . Reglan [metoclopramide]  12/31/2013  . Sulfa antibiotics Other (See Comments) 12/31/2013  . Actos [pioglitazone] Other (See Comments) 05/26/2015  . Amlodipine Swelling 05/26/2015  . Latex Rash 12/31/2013  . Lipitor [atorvastatin] Other (See Comments) 05/26/2015    Family History  Problem Relation Age of Onset  . Breast cancer Mother 56  . Diabetes Sister   . Heart disease Maternal Grandmother   . Heart disease Maternal Grandfather   . Brain cancer Grandchild     Social History   Socioeconomic History  . Marital status: Married    Spouse name: Not on file  . Number of children: Not on file  . Years of education: Not on file  . Highest education level: Some college, no degree  Occupational History  . Not on file  Social Needs  . Financial resource strain: Not hard at all  . Food insecurity    Worry: Never true    Inability: Never true  . Transportation needs    Medical: No    Non-medical: No  Tobacco Use  . Smoking status: Never Smoker  . Smokeless tobacco: Never Used  Substance and Sexual Activity  . Alcohol use: No    Alcohol/week: 0.0 standard drinks  . Drug use: Never  . Sexual activity: Not on file  Lifestyle  . Physical activity    Days per week: 0 days    Minutes per session: 0 min  . Stress: Not at all  Relationships  . Social connections    Talks on phone: More than three times a week    Gets together: More than three times a week    Attends religious service: More than 4 times per year    Active member of club or organization: Yes    Attends meetings of clubs or organizations: More than 4 times per year     Relationship status: Married  . Intimate partner violence    Fear of current or ex partner: No    Emotionally abused: No    Physically abused: No    Forced sexual activity: No  Other Topics Concern  . Not on file  Social History Narrative  . Not on file    Review of Systems: See HPI, otherwise negative ROS  Physical Exam: BP 139/84   Pulse 82   Temp (!) 96.4 F (35.8 C) (Tympanic)   Resp 18   Ht 5\' 5"  (1.651 m)   Wt 74.8 kg   SpO2 100%   BMI 27.46 kg/m  General:   Alert,  pleasant and cooperative in NAD Head:  Normocephalic and atraumatic. Neck:  Supple; no masses or thyromegaly. Lungs:  Clear throughout to auscultation.    Heart:  Regular rate and rhythm. Abdomen:  Soft, nontender and nondistended. Normal bowel sounds, without guarding, and without rebound.   Neurologic:  Alert and  oriented x4;  grossly normal neurologically.  Impression/Plan: Laura Rojas is now here to undergo a screening colonoscopy.  Risks, benefits, and alternatives regarding colonoscopy have been reviewed with the patient.  Questions have been answered.  All parties agreeable.

## 2019-08-19 NOTE — Op Note (Signed)
[facility] Patient Name: Laura Rojas Procedure Date: 08/19/2019 9:50 AM MRN: [id number] Account #: [id number] Date of Birth: [birth year] Admit Type: Outpatient Age: 73 Room: [facility] ENDO ROOM 4 Gender: Female Note Status: Finalized Procedure:            Colonoscopy Indications:          Screening for colorectal malignant neoplasm Providers:            Lucilla Lame MD, MD Referring MD:         Guadalupe Maple, MD (Referring MD) Medicines:            Propofol per Anesthesia Complications:        No immediate complications. Procedure:            Pre-Anesthesia Assessment:                       - Prior to the procedure, a History and Physical was                        performed, and patient medications and allergies were                        reviewed. The patient's tolerance of previous                        anesthesia was also reviewed. The risks and benefits of                        the procedure and the sedation options and risks were                        discussed with the patient. All questions were                        answered, and informed consent was obtained. Prior                        Anticoagulants: The patient has taken no previous                        anticoagulant or antiplatelet agents. ASA Grade                        Assessment: II - A patient with mild systemic disease.                        After reviewing the risks and benefits, the patient was                        deemed in satisfactory condition to undergo the                        procedure.                       After obtaining informed consent, the colonoscope was                        passed under direct vision. Throughout the procedure,  the patient's blood pressure, pulse, and oxygen                        saturations were monitored continuously. The                        Colonoscope was introduced through the anus and           advanced to the the cecum, identified by appendiceal                        orifice and ileocecal valve. The colonoscopy was                        performed without difficulty. The patient tolerated the                        procedure well. The quality of the bowel preparation                        was excellent. Findings:      The perianal and digital rectal examinations were normal.      Four sessile polyps were found in the transverse colon. The polyps were       4 to 7 mm in size. These polyps were removed with a cold snare.       Resection and retrieval were complete. To prevent bleeding       post-intervention, one hemostatic clip was successfully placed (MR       conditional). There was no bleeding at the end of the procedure.      A 4 mm polyp was found in the sigmoid colon. The polyp was sessile. The       polyp was removed with a cold snare. Resection and retrieval were       complete.      Non-bleeding internal hemorrhoids were found during retroflexion. The       hemorrhoids were Grade I (internal hemorrhoids that do not prolapse). Impression:           - Four 4 to 7 mm polyps in the transverse colon,                        removed with a cold snare. Resected and retrieved. Clip                        (MR conditional) was placed.                       - One 4 mm polyp in the sigmoid colon, removed with a                        cold snare. Resected and retrieved.                       - Non-bleeding internal hemorrhoids. Recommendation:       - Discharge patient to home.                       - Resume previous diet.                       - Continue present  medications.                       - Await pathology results.                       - Repeat colonoscopy in 5 years if polyp adenoma and 10                        years if hyperplastic Procedure Code(s):    --- Professional ---                       [phone], Colonoscopy, flexible; with removal of tumor(s),                         polyp(s), or other lesion(s) by snare technique Diagnosis Code(s):    --- Professional ---                       Z12.11, Encounter for screening for malignant neoplasm                        of colon                       K63.5, Polyp of colon CPT copyright 2019 American Medical Association. All rights reserved. The codes documented in this report are preliminary and upon coder review may  be revised to meet current compliance requirements. Lucilla Lame MD, MD 08/19/2019 10:15:36 AM This report has been signed electronically. Number of Addenda: 0 Note Initiated On: 08/19/2019 9:50 AM Scope Withdrawal Time: 0 hours 10 minutes 1 second  Total Procedure Duration: 0 hours 17 minutes 31 seconds  Estimated Blood Loss: Estimated blood loss: none.      [facility]

## 2019-08-19 NOTE — Anesthesia Postprocedure Evaluation (Signed)
Anesthesia Post Note  Patient: Laura Rojas  Procedure(s) Performed: COLONOSCOPY WITH PROPOFOL (N/A )  Patient location during evaluation: Endoscopy Anesthesia Type: General Level of consciousness: awake and alert and oriented Pain management: pain level controlled Vital Signs Assessment: post-procedure vital signs reviewed and stable Respiratory status: spontaneous breathing, nonlabored ventilation and respiratory function stable Cardiovascular status: blood pressure returned to baseline and stable Postop Assessment: no signs of nausea or vomiting Anesthetic complications: no     Last Vitals:  Vitals:   08/19/19 1030 08/19/19 1040  BP: (!) 82/49 (!) 149/77  Pulse: 67 60  Resp: 17 16  Temp:    SpO2: 100% 98%    Last Pain:  Vitals:   08/19/19 1020  TempSrc: Tympanic  PainSc:                  [name]

## 2019-08-19 NOTE — Anesthesia Procedure Notes (Signed)
Date/Time: 08/19/2019 10:10 AM Performed by: Nelda Marseille, CRNA Pre-anesthesia Checklist: Patient identified, Emergency Drugs available, Suction available, Patient being monitored and Timeout performed Oxygen Delivery Method: Nasal cannula

## 2019-08-19 NOTE — Anesthesia Post-op Follow-up Note (Signed)
Anesthesia QCDR form completed.        

## 2019-08-19 NOTE — Transfer of Care (Signed)
Immediate Anesthesia Transfer of Care Note  Patient: Laura Rojas  Procedure(s) Performed: COLONOSCOPY WITH PROPOFOL (N/A )  Patient Location: PACU  Anesthesia Type:General  Level of Consciousness: awake, alert  and oriented  Airway & Oxygen Therapy: Patient Spontanous Breathing and Patient connected to nasal cannula oxygen  Post-op Assessment: Report given to RN and Post -op Vital signs reviewed and stable  Post vital signs: Reviewed and stable  Last Vitals:  Vitals Value Taken Time  BP 82/49 08/19/19 1030  Temp 35.8 C 08/19/19 1020  Pulse 66 08/19/19 1036  Resp 19 08/19/19 1036  SpO2 98 % 08/19/19 1036  Vitals shown include unvalidated device data.  Last Pain:  Vitals:   08/19/19 1020  TempSrc: Tympanic  PainSc:          Complications: No apparent anesthesia complications

## 2019-08-19 NOTE — Anesthesia Preprocedure Evaluation (Signed)
Anesthesia Evaluation  Patient identified by MRN, date of birth, ID band Patient awake    Reviewed: Allergy & Precautions, NPO status , Patient's Chart, lab work & pertinent test results  History of Anesthesia Complications Negative for: history of anesthetic complications  Airway Mallampati: II  TM Distance: >3 FB Neck ROM: Full    Dental no notable dental hx.    Pulmonary neg pulmonary ROS, neg sleep apnea, neg COPD,    breath sounds clear to auscultation- rhonchi (-) wheezing      Cardiovascular hypertension, (-) angina+ CAD and + Cardiac Stents (last stent ~19yrs ago)  (-) CABG  Rhythm:Regular Rate:Normal - Systolic murmurs and - Diastolic murmurs    Neuro/Psych neg Seizures PSYCHIATRIC DISORDERS Anxiety Depression negative neurological ROS     GI/Hepatic negative GI ROS, Neg liver ROS,   Endo/Other  diabetes, Insulin Dependent  Renal/GU Renal InsufficiencyRenal disease     Musculoskeletal  (+) Arthritis ,   Abdominal (+) - obese,   Peds  Hematology  (+) anemia ,   Anesthesia Other Findings Past Medical History: No date: CAD (coronary artery disease) 12/31/2013: CAD (coronary artery disease)     Comment:  2007, 3.0 x 28 mm drug-eluting Cypher stent to RCA, 3.5               x 33 mm drug-eluting Cypher stent and proximal LAD)               Consent by 2009 Normal perfusion by mildly 2013  No date: Diabetes mellitus without complication (HCC) No date: Dyslipidemia No date: Polymyalgia rheumatica syndrome ([place]) 01/01/2015: Subclavian artery stenosis, left (HCC)   Reproductive/Obstetrics                             Anesthesia Physical Anesthesia Plan  ASA: III  Anesthesia Plan: General   Post-op Pain Management:    Induction: Intravenous  PONV Risk Score and Plan: 2 and Propofol infusion  Airway Management Planned: Natural Airway  Additional Equipment:   Intra-op Plan:    Post-operative Plan:   Informed Consent: I have reviewed the patients History and Physical, chart, labs and discussed the procedure including the risks, benefits and alternatives for the proposed anesthesia with the patient or authorized representative who has indicated his/her understanding and acceptance.     Dental advisory given  Plan Discussed with: CRNA and Anesthesiologist  Anesthesia Plan Comments:         Anesthesia Quick Evaluation

## 2019-08-20 ENCOUNTER — Encounter: Payer: Self-pay | Admitting: Gastroenterology

## 2019-08-20 LAB — SURGICAL PATHOLOGY

## 2019-08-21 ENCOUNTER — Encounter: Payer: Self-pay | Admitting: Gastroenterology

## 2019-08-26 ENCOUNTER — Telehealth: Payer: Self-pay | Admitting: *Deleted

## 2019-08-26 NOTE — Telephone Encounter (Signed)
The patient dropped off her blood pressure log. Per Dr. Sallyanne Kuster, she should continue the same medications. She has been called and verbalized her understanding.  9/10: 140/71 HR 56 9/11: 154/82 HR 61 9/12: 151/79 HR 58 9/13: 145/76 HR 67 9/14: 148/78 HR 65 9/15: 143/79 HR 73 9/16: 146/74 HR 79 9/17: 150/70 HR 66 9/18: 121/76 HR 84

## 2019-08-27 ENCOUNTER — Ambulatory Visit (INDEPENDENT_AMBULATORY_CARE_PROVIDER_SITE_OTHER): Payer: Medicare Other

## 2019-08-27 VITALS — Ht 62.0 in | Wt 165.0 lb

## 2019-08-27 DIAGNOSIS — Z0000 Encounter for general adult medical examination without abnormal findings: Secondary | ICD-10-CM | POA: Diagnosis not present

## 2019-08-27 NOTE — Patient Instructions (Signed)
Laura Rojas , Thank you for taking time to come for your Medicare Wellness Visit. I appreciate your ongoing commitment to your health goals. Please review the following plan we discussed and let me know if I can assist you in the future.   Screening recommendations/referrals: Colonoscopy: completed 08/19/2019 Mammogram: completed 08/18/2019 Bone Density: completed 2015 Recommended yearly ophthalmology/optometry visit for glaucoma screening and checkup Recommended yearly dental visit for hygiene and checkup  Vaccinations: Influenza vaccine: up to date Pneumococcal vaccine: up to date Tdap vaccine: up to date Shingles vaccine: up to date    Advanced directives: .please pick up a copy of this information next time you are in the office   Conditions/risks identified: diabetic- discussed chronic care management team   Next appointment: follow up in one year for your annual wellness visit    Preventive Care 74 Years and Older, Female Preventive care refers to lifestyle choices and visits with your health care provider that can promote health and wellness. What does preventive care include?  A yearly physical exam. This is also called an annual well check.  Dental exams once or twice a year.  Routine eye exams. Ask your health care provider how often you should have your eyes checked.  Personal lifestyle choices, including:  Daily care of your teeth and gums.  Regular physical activity.  Eating a healthy diet.  Avoiding tobacco and drug use.  Limiting alcohol use.  Practicing safe sex.  Taking low-dose aspirin every day.  Taking vitamin and mineral supplements as recommended by your health care provider. What happens during an annual well check? The services and screenings done by your health care provider during your annual well check will depend on your age, overall health, lifestyle risk factors, and family history of disease. Counseling  Your health care provider may ask  you questions about your:  Alcohol use.  Tobacco use.  Drug use.  Emotional well-being.  Home and relationship well-being.  Sexual activity.  Eating habits.  History of falls.  Memory and ability to understand (cognition).  Work and work Statistician.  Reproductive health. Screening  You may have the following tests or measurements:  Height, weight, and BMI.  Blood pressure.  Lipid and cholesterol levels. These may be checked every 5 years, or more frequently if you are over 68 years old.  Skin check.  Lung cancer screening. You may have this screening every year starting at age 74 if you have a 30-pack-year history of smoking and currently smoke or have quit within the past 15 years.  Fecal occult blood test (FOBT) of the stool. You may have this test every year starting at age 74.  Flexible sigmoidoscopy or colonoscopy. You may have a sigmoidoscopy every 5 years or a colonoscopy every 10 years starting at age 74.  Hepatitis C blood test.  Hepatitis B blood test.  Sexually transmitted disease (STD) testing.  Diabetes screening. This is done by checking your blood sugar (glucose) after you have not eaten for a while (fasting). You may have this done every 1-3 years.  Bone density scan. This is done to screen for osteoporosis. You may have this done starting at age 74.  Mammogram. This may be done every 1-2 years. Talk to your health care provider about how often you should have regular mammograms. Talk with your health care provider about your test results, treatment options, and if necessary, the need for more tests. Vaccines  Your health care provider may recommend certain vaccines, such as:  Influenza vaccine. This is recommended every year.  Tetanus, diphtheria, and acellular pertussis (Tdap, Td) vaccine. You may need a Td booster every 10 years.  Zoster vaccine. You may need this after age 74.  Pneumococcal 13-valent conjugate (PCV13) vaccine. One dose  is recommended after age 74.  Pneumococcal polysaccharide (PPSV23) vaccine. One dose is recommended after age 74. Talk to your health care provider about which screenings and vaccines you need and how often you need them. This information is not intended to replace advice given to you by your health care provider. Make sure you discuss any questions you have with your health care provider. Document Released: 12/10/2015 Document Revised: 08/02/2016 Document Reviewed: 09/14/2015 Elsevier Interactive Patient Education  2017 Cut Off Prevention in the Home Falls can cause injuries. They can happen to people of all ages. There are many things you can do to make your home safe and to help prevent falls. What can I do on the outside of my home?  Regularly fix the edges of walkways and driveways and fix any cracks.  Remove anything that might make you trip as you walk through a door, such as a raised step or threshold.  Trim any bushes or trees on the path to your home.  Use bright outdoor lighting.  Clear any walking paths of anything that might make someone trip, such as rocks or tools.  Regularly check to see if handrails are loose or broken. Make sure that both sides of any steps have handrails.  Any raised decks and porches should have guardrails on the edges.  Have any leaves, snow, or ice cleared regularly.  Use sand or salt on walking paths during winter.  Clean up any spills in your garage right away. This includes oil or grease spills. What can I do in the bathroom?  Use night lights.  Install grab bars by the toilet and in the tub and shower. Do not use towel bars as grab bars.  Use non-skid mats or decals in the tub or shower.  If you need to sit down in the shower, use a plastic, non-slip stool.  Keep the floor dry. Clean up any water that spills on the floor as soon as it happens.  Remove soap buildup in the tub or shower regularly.  Attach bath mats  securely with double-sided non-slip rug tape.  Do not have throw rugs and other things on the floor that can make you trip. What can I do in the bedroom?  Use night lights.  Make sure that you have a light by your bed that is easy to reach.  Do not use any sheets or blankets that are too big for your bed. They should not hang down onto the floor.  Have a firm chair that has side arms. You can use this for support while you get dressed.  Do not have throw rugs and other things on the floor that can make you trip. What can I do in the kitchen?  Clean up any spills right away.  Avoid walking on wet floors.  Keep items that you use a lot in easy-to-reach places.  If you need to reach something above you, use a strong step stool that has a grab bar.  Keep electrical cords out of the way.  Do not use floor polish or wax that makes floors slippery. If you must use wax, use non-skid floor wax.  Do not have throw rugs and other things on the floor that  can make you trip. What can I do with my stairs?  Do not leave any items on the stairs.  Make sure that there are handrails on both sides of the stairs and use them. Fix handrails that are broken or loose. Make sure that handrails are as long as the stairways.  Check any carpeting to make sure that it is firmly attached to the stairs. Fix any carpet that is loose or worn.  Avoid having throw rugs at the top or bottom of the stairs. If you do have throw rugs, attach them to the floor with carpet tape.  Make sure that you have a light switch at the top of the stairs and the bottom of the stairs. If you do not have them, ask someone to add them for you. What else can I do to help prevent falls?  Wear shoes that:  Do not have high heels.  Have rubber bottoms.  Are comfortable and fit you well.  Are closed at the toe. Do not wear sandals.  If you use a stepladder:  Make sure that it is fully opened. Do not climb a closed  stepladder.  Make sure that both sides of the stepladder are locked into place.  Ask someone to hold it for you, if possible.  Clearly mark and make sure that you can see:  Any grab bars or handrails.  First and last steps.  Where the edge of each step is.  Use tools that help you move around (mobility aids) if they are needed. These include:  Canes.  Walkers.  Scooters.  Crutches.  Turn on the lights when you go into a dark area. Replace any light bulbs as soon as they burn out.  Set up your furniture so you have a clear path. Avoid moving your furniture around.  If any of your floors are uneven, fix them.  If there are any pets around you, be aware of where they are.  Review your medicines with your doctor. Some medicines can make you feel dizzy. This can increase your chance of falling. Ask your doctor what other things that you can do to help prevent falls. This information is not intended to replace advice given to you by your health care provider. Make sure you discuss any questions you have with your health care provider. Document Released: 09/09/2009 Document Revised: 04/20/2016 Document Reviewed: 12/18/2014 Elsevier Interactive Patient Education  2017 [employer].

## 2019-08-27 NOTE — Progress Notes (Signed)
Subjective:   Laura Rojas is a 73 y.o. female who presents for Medicare Annual (Subsequent) preventive examination.  This visit is being conducted via phone call  - after an attmept to do on video chat - due to the COVID-19 pandemic. This patient has given me verbal consent via phone to conduct this visit, patient states they are participating from their home address. Some vital signs may be absent or patient reported.   Patient identification: identified by name, DOB, and current address.    Review of Systems:  Cardiac Risk Factors include: advanced age (>37men, >23 women);hypertension;dyslipidemia;diabetes mellitus     Objective:     Vitals: Ht 5\' 2"  (1.575 m)   Wt 165 lb (74.8 kg) Comment: pt reported  BMI 30.18 kg/m   Body mass index is 30.18 kg/m.  Advanced Directives 08/27/2019 08/19/2019 08/21/2018 07/18/2017  Does Patient Have a Medical Advance Directive? No No No No  Would patient like information on creating a medical advance directive? - - Yes (MAU/Ambulatory/Procedural Areas - Information given) No - Patient declined    Tobacco Social History   Tobacco Use  Smoking Status Never Smoker  Smokeless Tobacco Never Used     Counseling given: Not Answered   Clinical Intake:  Pre-visit preparation completed: Yes  Pain : No/denies pain Pain Score: 0-No pain     Nutritional Status: BMI 25 -29 Overweight Nutritional Risks: None Diabetes: No  How often do you need to have someone help you when you read instructions, pamphlets, or other written materials from your doctor or pharmacy?: 1 - Never  Nutrition Risk Assessment:  Has the patient had any N/V/D within the last 2 months?  No  Does the patient have any non-healing wounds?  No  Has the patient had any unintentional weight loss or weight gain?  No   Diabetes:  Is the patient diabetic?  Yes  If diabetic, was a CBG obtained today?  No  Did the patient bring in their glucometer from home?  No  How  often do you monitor your CBG's? Several times a day   Financial Strains and Diabetes Management:  Are you having any financial strains with the device, your supplies or your medication? No .  Does the patient want to be seen by Chronic Care Management for management of their diabetes?  Yes  Would the patient like to be referred to a Nutritionist or for Diabetic Management?  Yes   Diabetic Exams:  Diabetic Eye Exam: scheduled December 15th at [facility]   Diabetic Foot Exam:  Pt has been advised about the importance in completing this exam   Interpreter Needed?: No  Information entered by :: Jozalynn Noyce,LPN  Past Medical History:  Diagnosis Date  . CAD (coronary artery disease)   . CAD (coronary artery disease) 12/31/2013   2007, 3.0 x 28 mm drug-eluting Cypher stent to RCA, 3.5 x 33 mm drug-eluting Cypher stent and proximal LAD) Consent by 2009 Normal perfusion by mildly 2013   . Diabetes mellitus without complication ([place])   . Dyslipidemia   . Polymyalgia rheumatica syndrome ([place])   . Subclavian artery stenosis, left ([place]) 01/01/2015   Past Surgical History:  Procedure Laterality Date  . BREAST EXCISIONAL BIOPSY Right 1970   EXCISIONAL - NEG  . BREAST EXCISIONAL BIOPSY Left 1973   EXCISIONAL - NEG  . BREAST SURGERY    . CARDIAC CATHETERIZATION  08/25/2008   patent stents  . CAROTID DUPLEX  10/2006   NORMAL  CAROTID ARTERY DUPLEX  . COLONOSCOPY WITH PROPOFOL N/A 08/19/2019   Procedure: COLONOSCOPY WITH PROPOFOL;  Surgeon: Lucilla Lame, MD;  Location: [facility]COPY;  Service: Endoscopy;  Laterality: N/A;  . CORONARY ANGIOPLASTY WITH STENT PLACEMENT  09/17/2006   PTCA & stenting LAD  . ESOPHAGOGASTRODUODENOSCOPY N/A 04/27/2015   Procedure: ESOPHAGOGASTRODUODENOSCOPY (EGD);  Surgeon: Lucilla Lame, MD;  Location: [facility] ENDOSCOPY;  Service: Endoscopy;  Laterality: N/A;  . JOINT REPLACEMENT    . NM MYOCAR MULTIPLE W/SPECT  11/2011   EF 72%.NORMAL MYOCARDIAL PERFUSION STUDY   . REPLACEMENT TOTAL KNEE  2003 & 2004  . TRANSTHORACIC ECHOCARDIOGRAM  09/2006   EF =>55%. IMPAIRED LV RELAXATION. MILD MITRAL ANNULAR CALCIFICATION. AV-MILDLY SCLEROTIC. NO AS.  Marland Kitchen VAGINAL HYSTERECTOMY  1983   Family History  Problem Relation Age of Onset  . Breast cancer Mother 62  . Diabetes Sister   . Heart disease Maternal Grandmother   . Heart disease Maternal Grandfather   . Brain cancer Grandchild    Social History   Socioeconomic History  . Marital status: Married    Spouse name: Not on file  . Number of children: Not on file  . Years of education: Not on file  . Highest education level: Some college, no degree  Occupational History  . Not on file  Social Needs  . Financial resource strain: Not hard at all  . Food insecurity    Worry: Never true    Inability: Never true  . Transportation needs    Medical: No    Non-medical: No  Tobacco Use  . Smoking status: Never Smoker  . Smokeless tobacco: Never Used  Substance and Sexual Activity  . Alcohol use: No    Alcohol/week: 0.0 standard drinks  . Drug use: Never  . Sexual activity: Not on file  Lifestyle  . Physical activity    Days per week: 5 days    Minutes per session: 30 min  . Stress: Not at all  Relationships  . Social connections    Talks on phone: More than three times a week    Gets together: More than three times a week    Attends religious service: More than 4 times per year    Active member of club or organization: Yes    Attends meetings of clubs or organizations: More than 4 times per year    Relationship status: Married  Other Topics Concern  . Not on file  Social History Narrative  . Not on file    Outpatient Encounter Medications as of 08/27/2019  Medication Sig  . amLODipine (NORVASC) 2.5 MG tablet Take 1 tablet (2.5 mg total) by mouth daily.  Marland Kitchen aspirin 81 MG tablet Take 81 mg by mouth daily.  . clopidogrel (PLAVIX) 75 MG tablet Take 1 tablet (75 mg total) by mouth daily.  .  Exenatide (BYDUREON BCISE [place]) Inject 38 Units into the skin daily.  . ferrous sulfate 325 (65 FE) MG tablet Take 325 mg by mouth daily with breakfast.  . hydrochlorothiazide (MICROZIDE) 12.5 MG capsule Take 1 capsule (12.5 mg total) by mouth daily.  . Insulin Glargine (BASAGLAR KWIKPEN) 100 UNIT/ML SOPN 16 Units.  Marland Kitchen LORazepam (ATIVAN) 0.5 MG tablet Take 1 tablet (0.5 mg total) by mouth 2 (two) times daily. Use the additional 30 tabs only sparingly and only for severe anxiety  . losartan (COZAAR) 100 MG tablet Take 1 tablet (100 mg total) by mouth daily.  . Magnesium 400 MG TABS Take 1 tablet by mouth  daily.  . metFORMIN (GLUCOPHAGE) 1000 MG tablet Take 500 mg by mouth 2 (two) times daily.   Marland Kitchen NOVOLOG 100 UNIT/ML injection Inject 10 Units into the skin 3 (three) times daily.  . Omega-3 Fatty Acids (FISH OIL) 1200 MG CAPS Take 1,200 mg by mouth daily.  . pantoprazole (PROTONIX) 40 MG tablet Take 1 tablet (40 mg total) by mouth daily.  . sertraline (ZOLOFT) 100 MG tablet Take 1.5 tablets (150 mg total) by mouth daily.  . simvastatin (ZOCOR) 40 MG tablet Take 1 tablet (40 mg total) by mouth daily at 6 PM.  . [DISCONTINUED] betamethasone dipropionate (DIPROLENE) 0.05 % cream Apply topically 2 (two) times daily. (Patient not taking: Reported on 08/19/2019)  . [DISCONTINUED] colchicine 0.6 MG tablet Take 1 tablet (0.6 mg total) by mouth daily. (Patient not taking: Reported on 08/19/2019)   No facility-administered encounter medications on file as of 08/27/2019.     Activities of Daily Living In your present state of health, do you have any difficulty performing the following activities: 08/27/2019  Hearing? N  Comment no hearing aids  Vision? N  Comment eyeglasses, goes to [facility]  Difficulty concentrating or making decisions? Y  Comment sometimes it comes back  Walking or climbing stairs? N  Dressing or bathing? N  Doing errands, shopping? N  Preparing Food and eating ? N  Using the Toilet?  N  In the past six months, have you accidently leaked urine? N  Do you have problems with loss of bowel control? N  Managing your Medications? N  Managing your Finances? N  Housekeeping or managing your Housekeeping? N  Some recent data might be hidden    Patient Care Team: Guadalupe Maple, MD as PCP - General (Family Medicine) Marlowe Sax, MD as Referring Physician (Internal Medicine) Warnell Forester, NP as Nurse Practitioner (Surgery)    Assessment:   This is a routine wellness examination for Laura Rojas.  Exercise Activities and Dietary recommendations Current Exercise Habits: Home exercise routine, Type of exercise: walking, Time (Minutes): 25, Frequency (Times/Week): 5, Weekly Exercise (Minutes/Week): 125, Intensity: Mild, Exercise limited by: None identified  Goals    . Increase water intake     Recommend drinking at least 6-8 glasses of water a day        Fall Risk: Fall Risk  08/27/2019 10/15/2018 08/21/2018 05/15/2018 04/03/2018  Falls in the past year? 1 0 No No No  Number falls in past yr: 0 - - - -  Injury with Fall? 0 - - - -    FALL RISK PREVENTION PERTAINING TO THE HOME:  Any stairs in or around the home? Yes  If so, are there any without handrails? No   Home free of loose throw rugs in walkways, pet beds, electrical cords, etc? Yes  Adequate lighting in your home to reduce risk of falls? Yes   ASSISTIVE DEVICES UTILIZED TO PREVENT FALLS:  Life alert? No  Use of a cane, walker or w/c? No  Grab bars in the bathroom? No  Shower chair or bench in shower? No  Elevated toilet seat or a handicapped toilet? No   TIMED UP AND GO:  Unable to perform    Depression Screen PHQ 2/9 Scores 08/27/2019 01/08/2019 12/17/2018 10/15/2018  PHQ - 2 Score 1 2 6 4   PHQ- 9 Score - 7 14 15      Cognitive Function     6CIT Screen 08/21/2018 07/18/2017  What Year? 0 points 0 points  What month? 0  points 0 points  What time? 0 points 0 points  Count back from  20 0 points 0 points  Months in reverse 0 points 0 points  Repeat phrase 2 points 0 points  Total Score 2 0    Immunization History  Administered Date(s) Administered  . Influenza, High Dose Seasonal PF 09/04/2016, 08/23/2017, 08/28/2018, 07/14/2019  . Influenza,inj,Quad PF,6+ Mos 09/27/2015  . Influenza-Unspecified 09/18/2014  . Pneumococcal Conjugate-13 04/22/2014  . Pneumococcal Polysaccharide-23 11/12/2008, 07/05/2016  . Td 12/21/2009  . Zoster Recombinat (Shingrix) 07/14/2019    Qualifies for Shingles Vaccine? Yes received first dose of shingrix   Tdap: up to date   Flu Vaccine: up to date   Pneumococcal Vaccine: up to date   Screening Tests Health Maintenance  Topic Date Due  . FOOT EXAM  05/08/2019  . HEMOGLOBIN A1C  07/03/2019  . OPHTHALMOLOGY EXAM  07/11/2019  . TETANUS/TDAP  12/22/2019  . MAMMOGRAM  08/17/2021  . COLONOSCOPY  08/18/2024  . INFLUENZA VACCINE  Completed  . DEXA SCAN  Completed  . Hepatitis C Screening  Completed  . PNA vac Low Risk Adult  Completed    Cancer Screenings:  Colorectal Screening: Completed 08/19/2019. Repeat every 5 years  Mammogram: Completed 08/18/2019  Bone Density: Completed 2015.   Lung Cancer Screening: (Low Dose CT Chest recommended if Age 33-80 years, 30 pack-year currently smoking OR have quit w/in 15years.) does not qualify.   Additional Screening:  Hepatitis C Screening: does qualify; Completed 07/05/2016  Dental Screening: Recommended annual dental exams for proper oral hygiene   Community Resource Referral:  CRR required this visit?  No       Plan:  I have personally reviewed and addressed the Medicare Annual Wellness questionnaire and have noted the following in the patient's chart:  A. Medical and social history B. Use of alcohol, tobacco or illicit drugs  C. Current medications and supplements D. Functional ability and status E.  Nutritional status F.  Physical activity G. Advance directives H.  List of other physicians I.  Hospitalizations, surgeries, and ER visits in previous 12 months J.  [place] such as hearing and vision if needed, cognitive and depression L. Referrals and appointments   In addition, I have reviewed and discussed with patient certain preventive protocols, quality metrics, and best practice recommendations. A written personalized care plan for preventive services as well as general preventive health recommendations were provided to patient.   Signed,    Bevelyn Ngo, LPN  [id number] Nurse Health Advisor   Nurse Notes: none

## 2019-09-03 ENCOUNTER — Other Ambulatory Visit: Payer: Self-pay | Admitting: Family Medicine

## 2019-09-03 DIAGNOSIS — I2510 Atherosclerotic heart disease of native coronary artery without angina pectoris: Secondary | ICD-10-CM

## 2019-09-05 ENCOUNTER — Other Ambulatory Visit: Payer: Self-pay | Admitting: Family Medicine

## 2019-09-08 ENCOUNTER — Telehealth: Payer: Self-pay | Admitting: Family Medicine

## 2019-09-08 NOTE — Telephone Encounter (Signed)
Pt is calling and she got #180 lorazepam that runs out in dec 2020, however the pharm did not give her the additional 30 pt said dr Jeananne Rama authorize just in case she needs it for anxiety. [facility] in [place] [address] said they never received that message for additional #30

## 2019-09-08 NOTE — Telephone Encounter (Signed)
Too soon for a refill. Will have to wait for Dr. Jeananne Rama or come in for an appointment.

## 2019-09-08 NOTE — Telephone Encounter (Signed)
Routing to provider  

## 2019-09-09 NOTE — Telephone Encounter (Signed)
Patient notified

## 2019-09-11 NOTE — Telephone Encounter (Signed)
Looks like Tiffany had already discussed this with her 2 days ago

## 2019-09-11 NOTE — Telephone Encounter (Signed)
Pt called in to follow up. Pt says that she will wait for a response back from Dr. Jeananne Rama in regards to refill

## 2019-09-17 ENCOUNTER — Telehealth: Payer: Self-pay | Admitting: Family Medicine

## 2019-09-17 NOTE — Telephone Encounter (Signed)
Copied from [place] [id number]. Topic: General - Other >> Sep 16, 2019  8:57 AM Carolyn Stare wrote: Pt call to ask for a cream that was issue to her some years ago  she said it was for a rash under her breast   Betamethasone Diprotionate

## 2019-09-17 NOTE — Telephone Encounter (Signed)
Called pt, she states that she used what she had left of the cream previously stated. She states that she is going to hold off and see how the rash does before scheduling.

## 2019-09-17 NOTE — Telephone Encounter (Signed)
Not on medication list and may not be appropriate if this is a yeast infection. Please schedule appointment

## 2019-09-18 DIAGNOSIS — E1165 Type 2 diabetes mellitus with hyperglycemia: Secondary | ICD-10-CM | POA: Diagnosis not present

## 2019-09-18 DIAGNOSIS — E785 Hyperlipidemia, unspecified: Secondary | ICD-10-CM | POA: Diagnosis not present

## 2019-09-18 DIAGNOSIS — E1159 Type 2 diabetes mellitus with other circulatory complications: Secondary | ICD-10-CM | POA: Diagnosis not present

## 2019-09-18 DIAGNOSIS — Z794 Long term (current) use of insulin: Secondary | ICD-10-CM | POA: Diagnosis not present

## 2019-09-18 DIAGNOSIS — E1169 Type 2 diabetes mellitus with other specified complication: Secondary | ICD-10-CM | POA: Diagnosis not present

## 2019-09-18 DIAGNOSIS — I10 Essential (primary) hypertension: Secondary | ICD-10-CM | POA: Diagnosis not present

## 2019-09-24 ENCOUNTER — Other Ambulatory Visit: Payer: Self-pay | Admitting: Family Medicine

## 2019-09-24 DIAGNOSIS — I10 Essential (primary) hypertension: Secondary | ICD-10-CM

## 2019-09-24 LAB — HEMOGLOBIN A1C: Hemoglobin A1C: 8.8

## 2019-09-27 ENCOUNTER — Other Ambulatory Visit: Payer: Self-pay | Admitting: Family Medicine

## 2019-09-27 DIAGNOSIS — E782 Mixed hyperlipidemia: Secondary | ICD-10-CM

## 2019-09-27 DIAGNOSIS — I2510 Atherosclerotic heart disease of native coronary artery without angina pectoris: Secondary | ICD-10-CM

## 2019-10-10 ENCOUNTER — Other Ambulatory Visit: Payer: Self-pay | Admitting: Cardiovascular Disease

## 2019-10-14 ENCOUNTER — Ambulatory Visit (INDEPENDENT_AMBULATORY_CARE_PROVIDER_SITE_OTHER): Payer: Medicare Other | Admitting: Nurse Practitioner

## 2019-10-14 ENCOUNTER — Ambulatory Visit
Admission: RE | Admit: 2019-10-14 | Discharge: 2019-10-14 | Disposition: A | Discharge status: Home / Self Care | Payer: Medicare Other | Source: Ambulatory Visit | Attending: Nurse Practitioner | Admitting: Nurse Practitioner

## 2019-10-14 ENCOUNTER — Other Ambulatory Visit: Payer: Self-pay

## 2019-10-14 ENCOUNTER — Encounter: Payer: Self-pay | Admitting: Nurse Practitioner

## 2019-10-14 ENCOUNTER — Ambulatory Visit: Payer: Self-pay | Admitting: Pharmacist

## 2019-10-14 VITALS — BP 123/78 | HR 64 | Temp 97.7°F | Ht 65.0 in | Wt 165.0 lb

## 2019-10-14 DIAGNOSIS — M353 Polymyalgia rheumatica: Secondary | ICD-10-CM

## 2019-10-14 DIAGNOSIS — I2583 Coronary atherosclerosis due to lipid rich plaque: Secondary | ICD-10-CM

## 2019-10-14 DIAGNOSIS — I2510 Atherosclerotic heart disease of native coronary artery without angina pectoris: Secondary | ICD-10-CM

## 2019-10-14 NOTE — Patient Instructions (Addendum)
Polymyalgia Rheumatica Polymyalgia rheumatica (PMR) is an inflammatory disorder that causes the muscles and joints to ache and become stiff. Sometimes, PMR leads to a more dangerous condition that can cause vision loss (temporal arteritis or giant cell arteritis). What are the causes? The exact cause of PMR is not known. What increases the risk? You are more likely to develop this condition if you are:  Female.  74 years of age or older.  Caucasian. What are the signs or symptoms? Pain and stiffness are the main symptoms of PMR. Symptoms may:  Be worse after inactivity and in the morning.  Affect your: ? Hips, buttocks, and thighs. ? Neck, arms, and shoulders. This can make it hard to raise your arms above your head. ? Hands and wrists. Other symptoms include:  Fever.  Tiredness.  Weakness.  Depression.  Decreased appetite. This may lead to weight loss. Symptoms may start slowly or suddenly. How is this diagnosed? This condition is diagnosed with your medical history and a physical exam. You may need to see a health care provider who specializes in diseases of the joints, muscles, and bones (rheumatologist). You may also have tests, including:  Blood tests.  X-rays.  Ultrasound. How is this treated? PMR usually goes away without treatment, but it may take years. Your health care provider may recommend low-dose steroids and other medicines to help manage your symptoms of pain and stiffness. Regular exercise and rest will also help your symptoms. Follow these instructions at home:   Take over-the-counter and prescription medicines only as told by your health care provider.  Make sure to get enough rest and sleep.  Eat a healthy and nutritious diet.  Try to exercise most days of the week. Ask your health care provider what type of exercise is best for you.  Keep all follow-up visits as told by your health care provider. This is important. Contact a health care  provider if:  Your symptoms do not improve with medicine.  You have side effects from steroids. These may include: ? Weight gain. ? Swelling. ? Insomnia. ? Mood changes. ? Bruising. ? High blood sugar readings, if you have diabetes. ? Higher than normal blood pressure readings, if you monitor your blood pressure. Get help right away if:  You develop symptoms of temporal arteritis, such as: ? A change in vision. ? Severe headache. ? Scalp pain. ? Jaw pain. Summary  Polymyalgia rheumatica is an inflammatory disorder that causes aching and stiffness in your muscles and joints.  The exact cause of this condition is not known.  This condition usually goes away without treatment. Your health care provider may give you low-dose steroids to help manage your pain and stiffness.  Rest and regular exercise will help the symptoms. This information is not intended to replace advice given to you by your health care provider. Make sure you discuss any questions you have with your health care provider. Document Released: 12/21/2004 Document Revised: 09/19/2018 Document Reviewed: 09/19/2018 Elsevier Patient Education  [place].   Voltaren gel

## 2019-10-14 NOTE — Progress Notes (Signed)
BP 123/78   Pulse 64   Temp 97.7 F (36.5 C) (Oral)   Ht _0  (1.651 m)   Wt 165 lb (74.8 kg)   SpO2 95%   BMI 27.46 kg/m    Subjective:    Patient ID: Laura Rojas, female    DOB: 1946-10-15, 73 y.o.   MRN: [id number]  HPI: Laura Rojas is a 73 y.o. female  Chief Complaint  Patient presents with  . Pain    right hand, bilateral feet, left knee. pt states ongoing for long time but getting worse   CHRONIC JOINT PAIN On review is followed by rheumatology with diagnosis of polymyalgia rheumatica syndrome with a positive anti-CCP test.  She was last seen by them 05/14/2019, initial diagnosis June 2019.  Was previously taking Prednisone for this, but appears to have been discontinued in June 2020, were concerns for long term use with diabetes and she had been taking greater than a year.  Initially she reports the pain was in her hips and could not raise arms, this was prior to rheumatology.  Current pain she reports started presenting after coming off Prednisone and then started to have nodules to areas in summer time.  At home uses Aspercreme and Tylenol.   Does endorse when she gets up in morning having inflammation in joints and this improves after gets up moving.   Duration: chronic Involved joints: right hand, both feet, and left knee Mechanism of injury: arthritis Severity: 10/10 at worst and 3/10 at least amount Quality:  dull, aching and burning Frequency: intermittent Radiation: no Aggravating factors: walking and movement  Alleviating factors: Aspercreme and APAP  Status: fluctuating Treatments attempted: Tylenol and Aspercreme Relief with NSAIDs?:  No NSAIDs Taken Weakness with weight bearing or walking: no Sensation of giving way: no Locking: no Popping: no Bruising: no Swelling: no Redness: no Paresthesias/decreased sensation: no Fevers: no  Relevant past medical, surgical, family and social history reviewed and updated as indicated. Interim medical  history since our last visit reviewed. Allergies and medications reviewed and updated.  Review of Systems  Constitutional: Negative for activity change, appetite change, diaphoresis, fatigue and fever.  Respiratory: Negative for cough, chest tightness and shortness of breath.   Cardiovascular: Negative for chest pain, palpitations and leg swelling.  Gastrointestinal: Negative for abdominal distention, abdominal pain, constipation, diarrhea, nausea and vomiting.  Musculoskeletal: Positive for arthralgias.  Psychiatric/Behavioral: Negative.     Per HPI unless specifically indicated above     Objective:    BP 123/78   Pulse 64   Temp 97.7 F (36.5 C) (Oral)   Ht _1  (1.651 m)   Wt 165 lb (74.8 kg)   SpO2 95%   BMI 27.46 kg/m   Wt Readings from Last 3 Encounters:  10/14/19 165 lb (74.8 kg)  08/27/19 165 lb (74.8 kg)  08/19/19 165 lb (74.8 kg)    Physical Exam Vitals signs and nursing note reviewed.  Constitutional:      General: She is awake. She is not in acute distress.    Appearance: She is well-developed. She is not ill-appearing.  HENT:     Head: Normocephalic.     Right Ear: Hearing normal.     Left Ear: Hearing normal.  Eyes:     General: Lids are normal.        Right eye: No discharge.        Left eye: No discharge.     Conjunctiva/sclera: Conjunctivae normal.  Pupils: Pupils are equal, round, and reactive to light.  Neck:     Musculoskeletal: Normal range of motion and neck supple.     Vascular: No carotid bruit.  Cardiovascular:     Rate and Rhythm: Normal rate and regular rhythm.     Heart sounds: Normal heart sounds. No murmur. No gallop.   Pulmonary:     Effort: Pulmonary effort is normal. No accessory muscle usage or respiratory distress.     Breath sounds: Normal breath sounds.  Abdominal:     General: Bowel sounds are normal.     Palpations: Abdomen is soft. There is no hepatomegaly or splenomegaly.  Musculoskeletal:     Right hand: She  exhibits swelling. She exhibits normal range of motion and no tenderness. Normal sensation noted. Decreased strength noted.     Left hand: She exhibits swelling. She exhibits normal range of motion and no tenderness. Normal sensation noted. Decreased strength noted.     Right lower leg: No edema.     Left lower leg: No edema.     Right foot: Normal range of motion. No tenderness or swelling.     Left foot: Normal range of motion. Swelling present. No tenderness.     Comments: Bilateral hands at second MCP joint nodule present, firm and non tender with no erythema or warmth.  Left foot with firm nodule to medial dorsal aspect, no warmth or tenderness to area.    Skin:    General: Skin is warm and dry.  Neurological:     Mental Status: She is alert and oriented to person, place, and time.  Psychiatric:        Attention and Perception: Attention normal.        Mood and Affect: Mood normal.        Behavior: Behavior normal. Behavior is cooperative.        Thought Content: Thought content normal.        Judgment: Judgment normal.     Results for orders placed or performed during the hospital encounter of 08/19/19  Glucose, capillary  Result Value Ref Range   Glucose-Capillary 241 (H) 70 - 99 mg/dL  Surgical pathology  Result Value Ref Range   SURGICAL PATHOLOGY      SURGICAL PATHOLOGY CASE: [id number] [phone] PATIENT: Laura Rojas Surgical Pathology Report     Specimen Submitted: A. Colon polyp x4, transverse; cold snare B. Colon polyp, sigmoid; cold snare  Clinical History: Screening colonoscopy; colon polyps      DIAGNOSIS: A. POLYP  COLON POLYPS X4, TRANSVERSE; BIOPSIES: -MULTIPLE FRAGMENTS OF TUBULAR ADENOMA. - NEGATIVE FOR HIGH-GRADE DYSPLASIA AND MALIGNANCY.  B. COLON POLYP, SIGMOID; BIOPSY: - TUBULAR ADENOMA. - NEGATIVE FOR HIGH-GRADE DYSPLASIA AND MALIGNANCY.  GROSS DESCRIPTION: A. Labeled: Transverse colon polyp cold snare x4 Received: In formalin Tissue  fragment(s): Multiple Size: Aggregate, 1.0 x 0.9 x 0.3 cm Description: Tan soft tissue fragments Entirely submitted in 1 cassette.  B. Labeled: Sigmoid colon polyp cold snare Received: In formalin Tissue fragment(s): 1 Size: 0.4 cm Description: Tan soft polypoid lesion Entirely submitted in 1 cassette.   Final Diagnosis performed by Raynelle Bring, MD.    Electronically signed 08/20/2019 2:59:15PM The electronic signature indicates that the named Attending Pathologist has evaluated the specimen Technical component performed at [facility], [address] [place], [place] [address] Lab: [phone] Dir: Rush Farmer, MD, MMM  Professional component performed at [facility], [facility], [place], [place], [place] [address] Lab: [phone] Dir: Dellia Nims. Reuel Derby, MD  Assessment & Plan:   Problem List Items Addressed This Visit      Other   Polymyalgia rheumatica ([place]) - Primary    Multiple locations with exacerbation in pain since discontinuing Prednisone.  Recommend continued use of simple treatment at home, avoid Ibuprofen.  Called over to [facility] and able to obtain appt for patient on November 23rd.  Will obtain labs today CRP, CBC, ESR, uric acid + obtain imaging left foot.  Review rheumatology note once available.  Return as scheduled in December.      Relevant Orders   C-reactive protein   Sed Rate (ESR)   DG Foot Complete Left   CBC with Differential/Platelet out   Uric acid       Follow up plan: Return as scheduled in December for physical, for T2DM, HTN/HLD, Anxiety, Arthritis.

## 2019-10-14 NOTE — Patient Instructions (Signed)
Visit Information  Ms. Siguenza was given information about Chronic Care Management services today including:  1. CCM service includes personalized support from designated clinical staff supervised by her physician, including individualized plan of care and coordination with other care providers 2. 24/7 contact phone numbers for assistance for urgent and routine care needs. 3. Service will only be billed when office clinical staff spend 20 minutes or more in a month to coordinate care. 4. Only one practitioner may furnish and bill the service in a calendar month. 5. The patient may stop CCM services at any time (effective at the end of the month) by phone call to the office staff. 6. The patient will be responsible for cost sharing (co-pay) of up to 20% of the service fee (after annual deductible is met).  Patient agreed to services and verbal consent obtained.   The patient verbalized understanding of instructions provided today and declined a print copy of patient instruction materials.   Plan: - Scheduled phone call with patient in ~2-3 weeks  Catie Darnelle Maffucci, PharmD Clinical Pharmacist [place] [phone]

## 2019-10-14 NOTE — Assessment & Plan Note (Signed)
Multiple locations with exacerbation in pain since discontinuing Prednisone.  Recommend continued use of simple treatment at home, avoid Ibuprofen.  Called over to [facility] and able to obtain appt for patient on November 23rd.  Will obtain labs today CRP, CBC, ESR, uric acid + obtain imaging left foot.  Review rheumatology note once available.  Return as scheduled in December.

## 2019-10-14 NOTE — Chronic Care Management (AMB) (Signed)
  Chronic Care Management   Note  10/14/2019 Name: Laura Rojas MRN: [id number] DOB: [birth year]   Subjective:  Laura Rojas is a 73 y.o. year old female who is a primary care patient of Crissman, Jeannette How, MD. The CCM team was consulted for assistance with chronic disease management and care coordination needs.    Met with patient face to face after appointment w/ PCP today. Discussed upcoming rheumatology appointment. Patient asked about a refill on lorazepam allowing her to take TID. Discussed risks of long term BZD use, and that she should plan to discuss with [name] at upcoming December appointment about need to increase sertraline instead.    [name] was given information about Chronic Care Management services today including:  1. CCM service includes personalized support from designated clinical staff supervised by her physician, including individualized plan of care and coordination with other care providers 2. 24/7 contact phone numbers for assistance for urgent and routine care needs. 3. Service will only be billed when office clinical staff spend 20 minutes or more in a month to coordinate care. 4. Only one practitioner may furnish and bill the service in a calendar month. 5. The patient may stop CCM services at any time (effective at the end of the month) by phone call to the office staff. 6. The patient will be responsible for cost sharing (co-pay) of up to 20% of the service fee (after annual deductible is met).  Patient agreed to services and verbal consent obtained.    Plan: - Scheduled phone call with patient in ~2-3 weeks  Catie Darnelle Maffucci, PharmD Clinical Pharmacist [place] [phone]

## 2019-10-20 DIAGNOSIS — R768 Other specified abnormal immunological findings in serum: Secondary | ICD-10-CM | POA: Diagnosis not present

## 2019-10-20 DIAGNOSIS — M0609 Rheumatoid arthritis without rheumatoid factor, multiple sites: Secondary | ICD-10-CM | POA: Insufficient documentation

## 2019-10-20 DIAGNOSIS — M2550 Pain in unspecified joint: Secondary | ICD-10-CM | POA: Diagnosis not present

## 2019-10-20 DIAGNOSIS — Z96653 Presence of artificial knee joint, bilateral: Secondary | ICD-10-CM | POA: Diagnosis not present

## 2019-10-22 DIAGNOSIS — M0609 Rheumatoid arthritis without rheumatoid factor, multiple sites: Secondary | ICD-10-CM | POA: Diagnosis not present

## 2019-10-31 ENCOUNTER — Ambulatory Visit (INDEPENDENT_AMBULATORY_CARE_PROVIDER_SITE_OTHER): Payer: Medicare Other | Admitting: Pharmacist

## 2019-10-31 DIAGNOSIS — F339 Major depressive disorder, recurrent, unspecified: Secondary | ICD-10-CM | POA: Diagnosis not present

## 2019-10-31 DIAGNOSIS — E1122 Type 2 diabetes mellitus with diabetic chronic kidney disease: Secondary | ICD-10-CM | POA: Diagnosis not present

## 2019-10-31 DIAGNOSIS — I2510 Atherosclerotic heart disease of native coronary artery without angina pectoris: Secondary | ICD-10-CM | POA: Diagnosis not present

## 2019-10-31 DIAGNOSIS — N183 Chronic kidney disease, stage 3 unspecified: Secondary | ICD-10-CM | POA: Diagnosis not present

## 2019-10-31 DIAGNOSIS — F413 Other mixed anxiety disorders: Secondary | ICD-10-CM

## 2019-10-31 NOTE — Chronic Care Management (AMB) (Signed)
Chronic Care Management   Note  10/31/2019 Name: Laura Rojas MRN: [id number] DOB: November 04, 1946   Subjective:  Laura Rojas is a 73 y.o. year old female who is a primary care patient of Crissman, Jeannette How, MD. The CCM team was consulted for assistance with chronic disease management and care coordination needs.    Contacted patient for medication management review.   Review of patient status, including review of consultants reports, laboratory and other test data, was performed as part of comprehensive evaluation and provision of chronic care management services.   Objective:  Lab Results  Component Value Date   CREATININE 0.98 05/08/2019   CREATININE 0.98 08/28/2018   CREATININE 0.99 03/27/2018    Lab Results  Component Value Date   HGBA1C 9.6 01/02/2019       Component Value Date/Time   CHOL 170 05/08/2019 0939   TRIG 367 (H) 05/08/2019 0939   HDL 57 08/28/2018 1054   CHOLHDL 2.9 08/28/2018 1054   VLDL 73 (H) 05/08/2019 0939   LDLCALC 74 08/28/2018 1054    Clinical ASCVD: Yes - CAD (hx stenting) and PAD   BP Readings from Last 3 Encounters:  10/14/19 123/78  08/19/19 (!) 149/77  08/07/19 140/71    Allergies  Allergen Reactions  . Compazine [Prochlorperazine Edisylate] Other (See Comments)    choking  . Altace [Ramipril]   . Crestor [Rosuvastatin Calcium] Other (See Comments)    Myalgias  . Reglan [Metoclopramide]   . Sulfa Antibiotics Other (See Comments)    headache  . Actos [Pioglitazone] Other (See Comments)    Hot Flashes  . Amlodipine Swelling  . Latex Rash  . Lipitor [Atorvastatin] Other (See Comments)    Cramps    Medications Reviewed Today    Reviewed by De Hollingshead, [facility] (Pharmacist) on 10/31/19 at 1420  Med List Status: <None>  Medication Order Taking? Sig Documenting Provider Last Dose Status Informant  amLODipine (NORVASC) 2.5 MG tablet [id number] Yes TAKE 1 TABLET BY MOUTH EVERY DAY Croitoru, Mihai, MD Taking Active   aspirin 81  MG tablet [id number] Yes Take 81 mg by mouth daily. [provider] Taking Active Self  clopidogrel (PLAVIX) 75 MG tablet [id number] Yes TAKE 1 TABLET BY MOUTH EVERY DAY Crissman, Jeannette How, MD Taking Active   ferrous sulfate 325 (65 FE) MG tablet [id number] Yes Take 325 mg by mouth daily with breakfast. [provider] Taking Active   folic acid (FOLVITE) 1 MG tablet [id number] Yes Take 1 mg by mouth daily. [provider] Taking Active   hydrochlorothiazide (MICROZIDE) 12.5 MG capsule [id number] Yes Take 1 capsule (12.5 mg total) by mouth daily. Guadalupe Maple, MD Taking Active   Insulin Glargine [facility]) 100 UNIT/ML SOPN [id number] Yes Inject 18 Units into the skin at bedtime.  [provider] Taking Active   LORazepam (ATIVAN) 0.5 MG tablet [id number] Yes Take 1 tablet (0.5 mg total) by mouth 2 (two) times daily. Use the additional 30 tabs only sparingly and only for severe anxiety Crissman, Jeannette How, MD Taking Active            Med Note Darnelle Maffucci, Arville Lime   Fri Oct 31, 2019  2:20 PM) Taking QPM; using additional dose of QAM 1-3 days per week  losartan (COZAAR) 100 MG tablet [id number] Yes TAKE 1 TABLET BY MOUTH EVERY DAY Guadalupe Maple, MD Taking Active   Magnesium 400 MG TABS [id number] Yes Take 1 tablet by mouth daily. [provider] Taking  Active   metFORMIN (GLUCOPHAGE) 1000 MG tablet [id number] Yes Take 500 mg by mouth 2 (two) times daily.  [provider] Taking Active            Med Note Darnelle Maffucci, Arville Lime   Fri Oct 31, 2019  2:18 PM)    methotrexate 50 MG/2ML injection [id number] Yes Inject 15 mg into the skin once a week. [provider] Taking Active   NOVOLOG 100 UNIT/ML injection [id number] Yes Inject 10 Units into the skin 3 (three) times daily. [provider] Taking Active            Med Note Darnelle Maffucci, Arville Lime   Fri Oct 31, 2019  2:15 PM) 16-24   Omega-3 Fatty Acids (FISH OIL) 1200 MG CAPS [id number] Yes Take  1,200 mg by mouth daily. [provider] Taking Active Self  pantoprazole (PROTONIX) 40 MG tablet [id number] Yes TAKE 1 TABLET BY MOUTH EVERY DAY Crissman, Jeannette How, MD Taking Active   sertraline (ZOLOFT) 100 MG tablet [id number] Yes Take 1.5 tablets (150 mg total) by mouth daily. Guadalupe Maple, MD Taking Active            Med Note De Hollingshead   Tue Oct 14, 2019 11:21 AM) Taking 100 mg daily   simvastatin (ZOCOR) 40 MG tablet [id number] Yes TAKE 1 TABLET (40 MG TOTAL) BY MOUTH DAILY AT 6 PM. Guadalupe Maple, MD Taking Active            Assessment:   Goals Addressed            This Visit's Progress     Patient Stated   . PharmD "I'm worried about my health" (pt-stated)       Current Barriers:  . Polypharmacy; complex patient with multiple comorbidities including CAD (hx stenting); PAD; depression, T2DM . Self-manages medications. Denies affordability concerns o Depression: previously followed w/ psychiatry; currently taking sertraline 100 mg daily, lorazepam 0.5 mg 1 tab QPM, additional tab daily PRN anxiety. Patient is extremely concerned about running out of lorazepam. Notes that she has "spells" of depression that come over here that she takes lorazepam for. Notes having been on sertraline for at least 3-5 years. Hx paroxetine. Denies remembering escitalopram or citalopram, or busipirone. Notes she still grieves over the loss of a grandson a few years ago, and has felt more sad this past year o T2DM: follows w/ [facility]; see DM goal o CAD + PAD, HTN, HLD, ASCVD risk reduction: follows w/ Dr. Orene Desanctis; amlodipine 2.5 mg daily, HCTZ 12.5 mg daily, losartan 100 mg daily BP well at goal on last check; simvastatin 40 mg daily; last LDL at goal <70; ASA 81 mg daily + clopidogrel 75 mg daily o Polymyalgia rheumatica: recently re-established with rheumatology; MTX 15 mg weekly + folic acid 1 mg daily o GERD: pantoprazole 40 mg daily   Pharmacist Clinical Goal(s):   Marland Kitchen Over the next 90 days, patient will work with PharmD and provider towards optimized medication management  Interventions: . Comprehensive medication review performed; medication list updated in electronic medical record . Reviewed risk of long term benzodiazepine use in the elderly. Reviewed that alternative SSRI may be a safer, more successful option than increasing lorazepam dosing. Will discuss w/ [name], NP at her upcoming appointment.  Liana Gerold referral to LCSW for mental health support. Patient declined at this time, but noted that she would think about it  Patient Self Care Activities:  . Patient will take  medications as prescribed  Initial goal documentation     . PharmD "My sugars run high" (pt-stated)       Current Barriers:  . Diabetes: uncontrolled; complicated by chronic medical conditions including HTN, CAD/PAD (hx cardiac stenting); most recent A1c 8.8%; follows w/ [facility] . Current antihyperglycemic regimen: Lantus 18 units daily; Novolog 18 units base + sliding scale TID (2 extra units for every 50 mg/dL over 150); metformin 500 mg BID o Denies hx of any non-insulin injectables . Reports "once and a while" readings < 60;  . Current meal patterns: o Breakfast: Ham/sausage biscuit + diet coke o Lunch: Banana w/ peanut butter; tomato sandwich; chicken salad; occasionally hambuger from somewhere; occasional hot dog; sometimes chips; always w/ diet drink or unsweet tea + splenda o Supper: meat + 2 vegetables; meatloaf + boiled potatoes, lima beans; spaghetti w/ salad; Egg + sausage + toast  o Snacks: 3 little [place] chocolate chip cookies;  o Drinks: Diet coke;  Marland Kitchen Current exercise: except for last 2 weeks d/t thighs hurting, has been walking 25-30 minutes daily  . Current blood glucose readings:  o Uses FreeStyle Libre meter;  . Cardiovascular risk reduction: (followed by Dr. Orene Desanctis) o Current hypertensive regimen: amlodipine 2.5 mg daily, HCTZ 12.5  mg daily, losartan 100 mg daily o Current hyperlipidemia regimen: simvastatin 40 mg daily; last LDL at goal <70 o Current antiplatelet regimen: ASA 81 mg daily + clopidogrel 75 mg daily  Pharmacist Clinical Goal(s):  Marland Kitchen Over the next 90 days, patient will work with PharmD and primary care provider to address optimized   Interventions: . Comprehensive medication review performed, medication list updated in electronic medical record . Extensive dietary discussion; recommended choosing whole-wheat breads and pastas, and thinner slices. Recommended only eat 1/2 biscuit instead of full biscuit . Encouraged to resume exercise as arthritis pain improves . Encouraged to f/u with endocrinology as scheduled. Moving forward, consider recommending use of SGLT2 or GLP1 for ASCVD risk reduction and insulin burden reduction   Patient Self Care Activities:  . Patient will check blood glucose at least QID using FreeStyle Libre , document, and provide at future appointments . Patient will take medications as prescribed . Patient will report any questions or concerns to provider   Initial goal documentation        Plan: - Scheduled outreach call to patient in ~6 weeks for continued medication management support  Catie Darnelle Maffucci, PharmD, [place] [phone]

## 2019-10-31 NOTE — Patient Instructions (Signed)
Visit Information  Goals Addressed            This Visit's Progress     Patient Stated   . PharmD "I'm worried about my health" (pt-stated)       Current Barriers:  . Polypharmacy; complex patient with multiple comorbidities including CAD (hx stenting); PAD; depression, T2DM . Self-manages medications. Denies affordability concerns o Depression: previously followed w/ psychiatry; currently taking sertraline 100 mg daily, lorazepam 0.5 mg 1 tab QPM, additional tab daily PRN anxiety. Patient is extremely concerned about running out of lorazepam. Notes that she has "spells" of depression that come over here that she takes lorazepam for. Notes having been on sertraline for at least 3-5 years. Hx paroxetine. Denies remembering escitalopram or citalopram, or busipirone. Notes she still grieves over the loss of a grandson a few years ago, and has felt more sad this past year o T2DM: follows w/ [facility]; see DM goal o CAD + PAD, HTN, HLD, ASCVD risk reduction: follows w/ Dr. Orene Desanctis; amlodipine 2.5 mg daily, HCTZ 12.5 mg daily, losartan 100 mg daily BP well at goal on last check; simvastatin 40 mg daily; last LDL at goal <70; ASA 81 mg daily + clopidogrel 75 mg daily o Polymyalgia rheumatica: recently re-established with rheumatology; MTX 15 mg weekly + folic acid 1 mg daily o GERD: pantoprazole 40 mg daily   Pharmacist Clinical Goal(s):  Marland Kitchen Over the next 90 days, patient will work with PharmD and provider towards optimized medication management  Interventions: . Comprehensive medication review performed; medication list updated in electronic medical record . Reviewed risk of long term benzodiazepine use in the elderly. Reviewed that alternative SSRI may be a safer, more successful option than increasing lorazepam dosing. Will discuss w/ [name], NP at her upcoming appointment.  Liana Gerold referral to LCSW for mental health support. Patient declined at this time, but noted that she  would think about it  Patient Self Care Activities:  . Patient will take medications as prescribed  Initial goal documentation     . PharmD "My sugars run high" (pt-stated)       Current Barriers:  . Diabetes: uncontrolled; complicated by chronic medical conditions including HTN, CAD/PAD (hx cardiac stenting); most recent A1c 8.8%; follows w/ [facility] . Current antihyperglycemic regimen: Lantus 18 units daily; Novolog 18 units base + sliding scale TID (2 extra units for every 50 mg/dL over 150); metformin 500 mg BID o Denies hx of any non-insulin injectables . Reports "once and a while" readings < 60;  . Current meal patterns: o Breakfast: Ham/sausage biscuit + diet coke o Lunch: Banana w/ peanut butter; tomato sandwich; chicken salad; occasionally hambuger from somewhere; occasional hot dog; sometimes chips; always w/ diet drink or unsweet tea + splenda o Supper: meat + 2 vegetables; meatloaf + boiled potatoes, lima beans; spaghetti w/ salad; Egg + sausage + toast  o Snacks: 3 little [place] chocolate chip cookies;  o Drinks: Diet coke;  Marland Kitchen Current exercise: except for last 2 weeks d/t thighs hurting, has been walking 25-30 minutes daily  . Current blood glucose readings:  o Uses FreeStyle Libre meter;  . Cardiovascular risk reduction: (followed by Dr. Orene Desanctis) o Current hypertensive regimen: amlodipine 2.5 mg daily, HCTZ 12.5 mg daily, losartan 100 mg daily o Current hyperlipidemia regimen: simvastatin 40 mg daily; last LDL at goal <70 o Current antiplatelet regimen: ASA 81 mg daily + clopidogrel 75 mg daily  Pharmacist Clinical Goal(s):  Marland Kitchen Over the next  90 days, patient will work with PharmD and primary care provider to address optimized   Interventions: . Comprehensive medication review performed, medication list updated in electronic medical record . Extensive dietary discussion; recommended choosing whole-wheat breads and pastas, and thinner slices. Recommended only  eat 1/2 biscuit instead of full biscuit . Encouraged to resume exercise as arthritis pain improves . Encouraged to f/u with endocrinology as scheduled. Moving forward, consider recommending use of SGLT2 or GLP1 for ASCVD risk reduction and insulin burden reduction   Patient Self Care Activities:  . Patient will check blood glucose at least QID using FreeStyle Libre , document, and provide at future appointments . Patient will take medications as prescribed . Patient will report any questions or concerns to provider   Initial goal documentation        The patient verbalized understanding of instructions provided today and declined a print copy of patient instruction materials.    Plan: - Scheduled outreach call to patient in ~6 weeks for continued medication management support  Catie Darnelle Maffucci, PharmD, [place] [phone]

## 2019-11-09 ENCOUNTER — Encounter: Payer: Self-pay | Admitting: Nurse Practitioner

## 2019-11-09 DIAGNOSIS — E1165 Type 2 diabetes mellitus with hyperglycemia: Secondary | ICD-10-CM | POA: Insufficient documentation

## 2019-11-11 DIAGNOSIS — H2513 Age-related nuclear cataract, bilateral: Secondary | ICD-10-CM | POA: Diagnosis not present

## 2019-11-11 LAB — HM DIABETES EYE EXAM

## 2019-11-12 ENCOUNTER — Ambulatory Visit: Payer: Medicare Other | Admitting: Family Medicine

## 2019-11-13 ENCOUNTER — Other Ambulatory Visit: Payer: Self-pay

## 2019-11-13 ENCOUNTER — Telehealth: Payer: Self-pay | Admitting: Nurse Practitioner

## 2019-11-13 NOTE — Telephone Encounter (Signed)
Copied from [place] [phone]. Topic: Appointment Scheduling - Scheduling Inquiry for Clinic >> Nov 13, 2019 12:26 PM Greggory Keen D wrote: Reason for CRM: Pt called saying she has an appt tomorrow with Cristianna Cyr.  She found out today that her hair dresser tested positive along with two other hair dressers in the salon.  She wants to know what she needs to do.  CB#  [id number] >> Nov 13, 2019  1:20 PM Jill Side wrote: I put her in for a virtual in am and r/s ov to a later date  >> Nov 13, 2019 12:31 PM Greggory Keen D wrote: Add to CRM that patient was at the salon on Tuesday this week to have her hair done.Marland KitchenMarland Kitchen

## 2019-11-14 ENCOUNTER — Ambulatory Visit: Payer: Medicare Other | Admitting: Nurse Practitioner

## 2019-11-14 ENCOUNTER — Encounter: Payer: Self-pay | Admitting: Nurse Practitioner

## 2019-11-14 ENCOUNTER — Ambulatory Visit (INDEPENDENT_AMBULATORY_CARE_PROVIDER_SITE_OTHER): Payer: Medicare Other | Admitting: Nurse Practitioner

## 2019-11-14 ENCOUNTER — Other Ambulatory Visit: Payer: Self-pay

## 2019-11-14 VITALS — BP 148/73 | HR 62 | Temp 97.8°F

## 2019-11-14 DIAGNOSIS — Z20828 Contact with and (suspected) exposure to other viral communicable diseases: Secondary | ICD-10-CM

## 2019-11-14 DIAGNOSIS — I2510 Atherosclerotic heart disease of native coronary artery without angina pectoris: Secondary | ICD-10-CM

## 2019-11-14 DIAGNOSIS — I2583 Coronary atherosclerosis due to lipid rich plaque: Secondary | ICD-10-CM

## 2019-11-14 DIAGNOSIS — Z20822 Contact with and (suspected) exposure to covid-19: Secondary | ICD-10-CM | POA: Insufficient documentation

## 2019-11-14 NOTE — Assessment & Plan Note (Signed)
Exposed on 11/11/2019, no current symptoms.  Will send for testing.  Have recommended maintaining at home quarantine until testing returns.  Discussed scenarios for positive or negative testing and guidelines.  Patient agrees with this POC.  Will await testing results and determine POC.  Message sent to testing pool to schedule.

## 2019-11-14 NOTE — Progress Notes (Signed)
BP (!) 148/73   Pulse 62   Temp 97.8 F (36.6 C) (Oral)    Subjective:    Patient ID: Laura Rojas, female    DOB: 1946/01/07, 74 y.o.   MRN: [id number]  HPI: Laura Rojas is a 73 y.o. female  Chief Complaint  Patient presents with  . COVID    pt states she was exposed on Tuesday, states her hairdresser tested positive the next day    . This visit was completed via telephone due to the restrictions of the COVID-19 pandemic. All issues as above were discussed and addressed but no physical exam was performed. If it was felt that the patient should be evaluated in the office, they were directed there. The patient verbally consented to this visit. Patient was unable to complete an audio/visual visit due to lack of equipment. Due to the catastrophic nature of the COVID-19 pandemic, this visit was done through audio contact only. . Location of the patient: home . Location of the provider: home . Those involved with this call:  . Provider: [name], DNP . CMA: Yvonna Alanis, CMA . Front Desk/Registration: Jill Side  . Time spent on call: 15 minutes on the phone discussing health concerns. 10 minutes total spent in review of patient's record and preparation of their chart.  . I verified patient identity using two factors (patient name and date of birth). Patient consents verbally to being seen via telemedicine visit today.    COVID EXPOSURE She was exposed this Tuesday, 11/11/19, her hairdresser tested positive following day after this visit with her.  Currently denies any symptoms.  Her hairdresser was having symptoms on Wednesday + two other hairdressers that were in there when patient was.  Denies loss of taste or smell.  They were all wearing masks.   Fever: no Cough: no Shortness of breath: no Wheezing: no Chest pain: no Chest tightness: no Chest congestion: no Nasal congestion: no Runny nose: no Post nasal drip: no Sneezing: no Sore throat: no Swollen  glands: no Sinus pressure: no Headache: no Face pain: no Toothache: no Ear pain: none Ear pressure: none Eyes red/itching:no Eye drainage/crusting: no  Vomiting: no Rash: no Fatigue: no Sick contacts: yes  Relevant past medical, surgical, family and social history reviewed and updated as indicated. Interim medical history since our last visit reviewed. Allergies and medications reviewed and updated.  Review of Systems  Constitutional: Negative for activity change, appetite change, diaphoresis, fatigue and fever.  Respiratory: Negative for cough, chest tightness, shortness of breath and wheezing.   Cardiovascular: Negative for chest pain, palpitations and leg swelling.  Gastrointestinal: Negative for abdominal distention, abdominal pain, constipation, diarrhea, nausea and vomiting.  Neurological: Negative.   Psychiatric/Behavioral: Negative.     Per HPI unless specifically indicated above     Objective:    BP (!) 148/73   Pulse 62   Temp 97.8 F (36.6 C) (Oral)   Wt Readings from Last 3 Encounters:  10/14/19 165 lb (74.8 kg)  08/27/19 165 lb (74.8 kg)  08/19/19 165 lb (74.8 kg)    Physical Exam   Unable to perform due to telephone visit only  Results for orders placed or performed in visit on 11/11/19  HM DIABETES EYE EXAM  Result Value Ref Range   HM Diabetic Eye Exam No Retinopathy No Retinopathy      Assessment & Plan:   Problem List Items Addressed This Visit      Other   Close exposure to  COVID-19 virus - Primary    Exposed on 11/11/2019, no current symptoms.  Will send for testing.  Have recommended maintaining at home quarantine until testing returns.  Discussed scenarios for positive or negative testing and guidelines.  Patient agrees with this POC.  Will await testing results and determine POC.  Message sent to testing pool to schedule.      Relevant Orders   Novel Coronavirus, NAA ([facility])      I discussed the assessment and treatment plan with  the patient. The patient was provided an opportunity to ask questions and all were answered. The patient agreed with the plan and demonstrated an understanding of the instructions.   The patient was advised to call back or seek an in-person evaluation if the symptoms worsen or if the condition fails to improve as anticipated.   I provided 15 minutes of time during this encounter.  Follow up plan: Return if symptoms worsen or fail to improve.

## 2019-11-14 NOTE — Patient Instructions (Signed)

## 2019-11-17 ENCOUNTER — Ambulatory Visit: Payer: Medicare Other | Attending: Internal Medicine

## 2019-11-17 ENCOUNTER — Other Ambulatory Visit: Payer: Self-pay

## 2019-11-17 DIAGNOSIS — Z20822 Contact with and (suspected) exposure to covid-19: Secondary | ICD-10-CM

## 2019-11-17 DIAGNOSIS — Z20828 Contact with and (suspected) exposure to other viral communicable diseases: Secondary | ICD-10-CM | POA: Diagnosis not present

## 2019-11-17 DIAGNOSIS — I10 Essential (primary) hypertension: Secondary | ICD-10-CM

## 2019-11-17 MED ORDER — HYDROCHLOROTHIAZIDE 12.5 MG PO CAPS: 12.5000 mg | ORAL_CAPSULE | Freq: Every day | ORAL | 3 refills | Status: DC

## 2019-11-17 NOTE — Telephone Encounter (Signed)
Patient last regular OV was 07/17/19 and has f/up 12/17/19.

## 2019-11-18 LAB — NOVEL CORONAVIRUS, NAA: SARS-CoV-2, NAA: NOT DETECTED

## 2019-11-19 DIAGNOSIS — Z79899 Other long term (current) drug therapy: Secondary | ICD-10-CM | POA: Diagnosis not present

## 2019-11-19 DIAGNOSIS — M0609 Rheumatoid arthritis without rheumatoid factor, multiple sites: Secondary | ICD-10-CM | POA: Diagnosis not present

## 2019-12-17 ENCOUNTER — Encounter: Payer: Self-pay | Admitting: Nurse Practitioner

## 2019-12-17 ENCOUNTER — Ambulatory Visit (INDEPENDENT_AMBULATORY_CARE_PROVIDER_SITE_OTHER): Payer: Medicare Other | Admitting: Pharmacist

## 2019-12-17 ENCOUNTER — Ambulatory Visit (INDEPENDENT_AMBULATORY_CARE_PROVIDER_SITE_OTHER): Payer: Medicare Other | Admitting: Nurse Practitioner

## 2019-12-17 ENCOUNTER — Other Ambulatory Visit: Payer: Self-pay

## 2019-12-17 VITALS — BP 114/67 | HR 63 | Temp 97.7°F

## 2019-12-17 DIAGNOSIS — Z794 Long term (current) use of insulin: Secondary | ICD-10-CM

## 2019-12-17 DIAGNOSIS — Z0000 Encounter for general adult medical examination without abnormal findings: Secondary | ICD-10-CM

## 2019-12-17 DIAGNOSIS — K219 Gastro-esophageal reflux disease without esophagitis: Secondary | ICD-10-CM | POA: Insufficient documentation

## 2019-12-17 DIAGNOSIS — E1169 Type 2 diabetes mellitus with other specified complication: Secondary | ICD-10-CM

## 2019-12-17 DIAGNOSIS — I771 Stricture of artery: Secondary | ICD-10-CM

## 2019-12-17 DIAGNOSIS — E1159 Type 2 diabetes mellitus with other circulatory complications: Secondary | ICD-10-CM | POA: Diagnosis not present

## 2019-12-17 DIAGNOSIS — D510 Vitamin B12 deficiency anemia due to intrinsic factor deficiency: Secondary | ICD-10-CM

## 2019-12-17 DIAGNOSIS — N183 Chronic kidney disease, stage 3 unspecified: Secondary | ICD-10-CM

## 2019-12-17 DIAGNOSIS — I25708 Atherosclerosis of coronary artery bypass graft(s), unspecified, with other forms of angina pectoris: Secondary | ICD-10-CM | POA: Diagnosis not present

## 2019-12-17 DIAGNOSIS — F413 Other mixed anxiety disorders: Secondary | ICD-10-CM | POA: Diagnosis not present

## 2019-12-17 DIAGNOSIS — F5101 Primary insomnia: Secondary | ICD-10-CM

## 2019-12-17 DIAGNOSIS — E559 Vitamin D deficiency, unspecified: Secondary | ICD-10-CM

## 2019-12-17 DIAGNOSIS — F339 Major depressive disorder, recurrent, unspecified: Secondary | ICD-10-CM

## 2019-12-17 DIAGNOSIS — I10 Essential (primary) hypertension: Secondary | ICD-10-CM

## 2019-12-17 DIAGNOSIS — E1122 Type 2 diabetes mellitus with diabetic chronic kidney disease: Secondary | ICD-10-CM

## 2019-12-17 DIAGNOSIS — E785 Hyperlipidemia, unspecified: Secondary | ICD-10-CM | POA: Diagnosis not present

## 2019-12-17 DIAGNOSIS — I2510 Atherosclerotic heart disease of native coronary artery without angina pectoris: Secondary | ICD-10-CM

## 2019-12-17 DIAGNOSIS — D509 Iron deficiency anemia, unspecified: Secondary | ICD-10-CM

## 2019-12-17 DIAGNOSIS — Z7189 Other specified counseling: Secondary | ICD-10-CM

## 2019-12-17 DIAGNOSIS — M353 Polymyalgia rheumatica: Secondary | ICD-10-CM | POA: Diagnosis not present

## 2019-12-17 DIAGNOSIS — E1165 Type 2 diabetes mellitus with hyperglycemia: Secondary | ICD-10-CM

## 2019-12-17 LAB — MICROALBUMIN, URINE WAIVED
Creatinine, Urine Waived: 200 mg/dL (ref 10–300)
Microalb, Ur Waived: 10 mg/L (ref 0–19)
Microalb/Creat Ratio: <30 mg/g (ref <30)

## 2019-12-17 NOTE — Assessment & Plan Note (Signed)
Continue collaboration with cardiology and preventative ASA + statin therapy.

## 2019-12-17 NOTE — Assessment & Plan Note (Signed)
Chronic, stable with BP at goal in office and at home.  Continue current medication regimen and collaboration with cardiology. CMP today.

## 2019-12-17 NOTE — Patient Instructions (Signed)
Visit Information  Goals Addressed            This Visit's Progress     Patient Stated   . PharmD "I'm worried about my health" (pt-stated)       Current Barriers:  . Polypharmacy; complex patient with multiple comorbidities including CAD (hx stenting); PAD; depression, T2DM . Self-manages medications. Denies affordability concerns o Depression: previously followed w/ psychiatry; currently taking sertraline 100 mg daily, lorazepam 0.5 mg 1 tab QPM, additional tab daily PRN anxiety.  o T2DM: follows w/ [facility]; see DM goal o CAD + PAD, HTN, HLD, ASCVD risk reduction: follows w/ Dr. Orene Desanctis; amlodipine 2.5 mg daily, HCTZ 12.5 mg daily, losartan 100 mg daily BP well at goal today simvastatin 40 mg daily; last LDL at goal <70; ASA 81 mg daily + clopidogrel 75 mg daily o Polymyalgia rheumatica: MTX 15 mg weekly + folic acid 1 mg daily o GERD: pantoprazole 40 mg daily  o Health Maintenance: Vitamin D, Vitamin B12, magnesium, multivitamin  Pharmacist Clinical Goal(s):  Marland Kitchen Over the next 90 days, patient will work with PharmD and provider towards optimized medication management  Interventions: . Comprehensive medication review performed; medication list updated in electronic medical record . Reviewed that increase in SSRI dose is likely appropriate at this time, and goal is to reduce need for BZD.   Patient Self Care Activities:  . Patient will take medications as prescribed  Please see past updates related to this goal by clicking on the "Past Updates" button in the selected goal      . PharmD "My sugars run high" (pt-stated)       Current Barriers:  . Diabetes: uncontrolled; complicated by chronic medical conditions including HTN, CAD/PAD (hx cardiac stenting); most recent A1c 8.8%; follows w/ [facility], next appointment 12/31/19 . Current antihyperglycemic regimen: Lantus 20 units daily, but patient reports that she is only taking 12-14 because she is worried about low  blood sugars overnight; Novolog 18 units base + sliding scale TID (2 extra units for every 50 mg/dL over 150); metformin 500 mg BID o Denies hx of any non-insulin injectables . Current blood glucose readings:  o See CGM download above. Average BG 174. Above goal 40% of time . Current exercise: walking on treadmill 25-30 minutes daily  . Cardiovascular risk reduction: (followed by Dr. Orene Desanctis) o Current hypertensive regimen: amlodipine 2.5 mg daily, HCTZ 12.5 mg daily, losartan 100 mg daily o Current hyperlipidemia regimen: simvastatin 40 mg daily; last LDL at goal <70 o Current antiplatelet regimen: ASA 81 mg daily + clopidogrel 75 mg daily  Pharmacist Clinical Goal(s):  Marland Kitchen Over the next 90 days, patient will work with PharmD and primary care provider to address optimized   Interventions: . Comprehensive medication review performed, medication list updated in electronic medical record . Downloaded CGM, reviewed with patient and PCP. Indicates need for more basal coverage. Educated patient on pharmacokinetics of basal + bolus insulin. Recommended she increase to 14-16, possibly 18 units daily, to target better control prior to appointment w/ PCP . Discussed benefit of SGLT2 and GLP1 based on ASCVD hx. Will communicate w/ endocrinology prior to patient's next appointment to suggest a discussion regarding benefit/risk of one of these option, and offer medication access support as needed  Patient Self Care Activities:  . Patient will check blood glucose at least QID using FreeStyle Libre , document, and provide at future appointments . Patient will take medications as prescribed . Patient will report any questions  or concerns to provider   Please see past updates related to this goal by clicking on the "Past Updates" button in the selected goal         The patient verbalized understanding of instructions provided today and declined a print copy of patient instruction materials.    Plan:   - Will outreach [facility] end[facility]ology prior to patient's next appointment - Scheduled f/u call 01/21/20  Catie Darnelle Maffucci, PharmD, [place] [phone]

## 2019-12-17 NOTE — Assessment & Plan Note (Signed)
Chronic, ongoing.  Continue Losartan for kidney protection.  Urine micro in office ALB 10 and A:C <30.  Continue collaboration with cardiology and endocrinology.

## 2019-12-17 NOTE — Assessment & Plan Note (Signed)
Continue collaboration with cardiology and preventative treatment.

## 2019-12-17 NOTE — Assessment & Plan Note (Signed)
Chronic, ongoing, followed by endocrinology with recent A1C 8.8%.  Continue this collaboration and current medication regimen + collaboration with CCM team in office.  Patient is to start taking her Lantus 20 units, as was recommended by endocrinology.  Monitor for low BS <70, especially due to age.  Would benefit from trial of GLP ot SGLT (especially with cardiac health), will discuss with endocrinology.  Return to office in 3 months.

## 2019-12-17 NOTE — Patient Instructions (Signed)
Carbohydrate Counting for Diabetes Mellitus, Adult  Carbohydrate counting is a method of keeping track of how many carbohydrates you eat. Eating carbohydrates naturally increases the amount of sugar (glucose) in the blood. Counting how many carbohydrates you eat helps keep your blood glucose within normal limits, which helps you manage your diabetes (diabetes mellitus). It is important to know how many carbohydrates you can safely have in each meal. This is different for every person. A diet and nutrition specialist (registered dietitian) can help you make a meal plan and calculate how many carbohydrates you should have at each meal and snack. Carbohydrates are found in the following foods:  Grains, such as breads and cereals.  Dried beans and soy products.  Starchy vegetables, such as potatoes, peas, and corn.  Fruit and fruit juices.  Milk and yogurt.  Sweets and snack foods, such as cake, cookies, candy, chips, and soft drinks. How do I count carbohydrates? There are two ways to count carbohydrates in food. You can use either of the methods or a combination of both. Reading "Nutrition Facts" on packaged food The "Nutrition Facts" list is included on the labels of almost all packaged foods and beverages in the [place] It includes:  The serving size.  Information about nutrients in each serving, including the grams (g) of carbohydrate per serving. To use the "Nutrition Facts":  Decide how many servings you will have.  Multiply the number of servings by the number of carbohydrates per serving.  The resulting number is the total amount of carbohydrates that you will be having. Learning standard serving sizes of other foods When you eat carbohydrate foods that are not packaged or do not include "Nutrition Facts" on the label, you need to measure the servings in order to count the amount of carbohydrates:  Measure the foods that you will eat with a food scale or measuring cup, if  needed.  Decide how many standard-size servings you will eat.  Multiply the number of servings by 15. Most carbohydrate-rich foods have about 15 g of carbohydrates per serving. ? For example, if you eat 8 oz (170 g) of strawberries, you will have eaten 2 servings and 30 g of carbohydrates (2 servings x 15 g = 30 g).  For foods that have more than one food mixed, such as soups and casseroles, you must count the carbohydrates in each food that is included. The following list contains standard serving sizes of common carbohydrate-rich foods. Each of these servings has about 15 g of carbohydrates:   hamburger bun or  English muffin.   oz (15 mL) syrup.   oz (14 g) jelly.  1 slice of bread.  1 six-inch tortilla.  3 oz (85 g) cooked rice or pasta.  4 oz (113 g) cooked dried beans.  4 oz (113 g) starchy vegetable, such as peas, corn, or potatoes.  4 oz (113 g) hot cereal.  4 oz (113 g) mashed potatoes or  of a large baked potato.  4 oz (113 g) canned or frozen fruit.  4 oz (120 mL) fruit juice.  4-6 crackers.  6 chicken nuggets.  6 oz (170 g) unsweetened dry cereal.  6 oz (170 g) plain fat-free yogurt or yogurt sweetened with artificial sweeteners.  8 oz (240 mL) milk.  8 oz (170 g) fresh fruit or one small piece of fruit.  24 oz (680 g) popped popcorn. Example of carbohydrate counting Sample meal  3 oz (85 g) chicken breast.  6 oz (170 g)   brown rice.  4 oz (113 g) corn.  8 oz (240 mL) milk.  8 oz (170 g) strawberries with sugar-free whipped topping. Carbohydrate calculation 1. Identify the foods that contain carbohydrates: ? Rice. ? Corn. ? Milk. ? Strawberries. 2. Calculate how many servings you have of each food: ? 2 servings rice. ? 1 serving corn. ? 1 serving milk. ? 1 serving strawberries. 3. Multiply each number of servings by 15 g: ? 2 servings rice x 15 g = 30 g. ? 1 serving corn x 15 g = 15 g. ? 1 serving milk x 15 g = 15 g. ? 1  serving strawberries x 15 g = 15 g. 4. Add together all of the amounts to find the total grams of carbohydrates eaten: ? 30 g + 15 g + 15 g + 15 g = 75 g of carbohydrates total. Summary  Carbohydrate counting is a method of keeping track of how many carbohydrates you eat.  Eating carbohydrates naturally increases the amount of sugar (glucose) in the blood.  Counting how many carbohydrates you eat helps keep your blood glucose within normal limits, which helps you manage your diabetes.  A diet and nutrition specialist (registered dietitian) can help you make a meal plan and calculate how many carbohydrates you should have at each meal and snack. This information is not intended to replace advice given to you by your health care provider. Make sure you discuss any questions you have with your health care provider. Document Revised: 06/07/2017 Document Reviewed: 04/26/2016 Elsevier Patient Education  2020 [employer]  

## 2019-12-17 NOTE — Assessment & Plan Note (Signed)
Chronic, ongoing with use of Lorazepam nightly.  Has had trial reductions in past and other medications without success.  Is fully aware of risks of chronic benzo use and aware this will be discussed each visit.  Return in 3 months for UDS and contract.

## 2019-12-17 NOTE — Chronic Care Management (AMB) (Addendum)
Chronic Care Management   Follow Up Note   12/17/2019 Name: Laura Rojas MRN: [id number] DOB: 04-05-1946  Referred by: Guadalupe Maple, MD Reason for referral : Chronic Care Management (Medication Management)   Laura Rojas is a 74 y.o. year old female who is a primary care patient of Crissman, Jeannette How, MD. The CCM team was consulted for assistance with chronic disease management and care coordination needs.    Met with patient face to face prior to PCP appointment today.   Review of patient status, including review of consultants reports, relevant laboratory and other test results, and collaboration with appropriate care team members and the patient's provider was performed as part of comprehensive patient evaluation and provision of chronic care management services.    SDOH (Social Determinants of Health) screening performed today: Depression   Physical Activity. See Care Plan for related entries.       Outpatient Encounter Medications as of 12/17/2019  Medication Sig Note  . amLODipine (NORVASC) 2.5 MG tablet TAKE 1 TABLET BY MOUTH EVERY DAY   . aspirin 81 MG tablet Take 81 mg by mouth daily.   . cholecalciferol (VITAMIN D3) 25 MCG (1000 UT) tablet Take 1,000 Units by mouth daily.   . clopidogrel (PLAVIX) 75 MG tablet TAKE 1 TABLET BY MOUTH EVERY DAY   . ferrous sulfate 325 (65 FE) MG tablet Take 325 mg by mouth daily with breakfast.   . folic acid (FOLVITE) 1 MG tablet Take 1 mg by mouth daily.   . hydrochlorothiazide (MICROZIDE) 12.5 MG capsule Take 1 capsule (12.5 mg total) by mouth daily.   . Insulin Glargine (BASAGLAR KWIKPEN) 100 UNIT/ML SOPN Inject 18 Units into the skin at bedtime.    Marland Kitchen LORazepam (ATIVAN) 0.5 MG tablet Take 1 tablet (0.5 mg total) by mouth 2 (two) times daily. Use the additional 30 tabs only sparingly and only for severe anxiety 10/31/2019: Taking QPM; using additional dose of QAM 1-3 days per week  . losartan (COZAAR) 100 MG tablet TAKE 1 TABLET BY  MOUTH EVERY DAY   . Magnesium 400 MG TABS Take 1 tablet by mouth daily.   . metFORMIN (GLUCOPHAGE) 1000 MG tablet Take 500 mg by mouth 2 (two) times daily.    . methotrexate 50 MG/2ML injection Inject 15 mg into the skin once a week.   . Multiple Vitamin (MULTIVITAMIN) tablet Take 1 tablet by mouth daily.   Marland Kitchen NOVOLOG 100 UNIT/ML injection Inject 10 Units into the skin 3 (three) times daily. 10/31/2019: 16-24   . Omega-3 Fatty Acids (FISH OIL) 1200 MG CAPS Take 1,200 mg by mouth daily.   . pantoprazole (PROTONIX) 40 MG tablet TAKE 1 TABLET BY MOUTH EVERY DAY   . sertraline (ZOLOFT) 100 MG tablet Take 1.5 tablets (150 mg total) by mouth daily. 10/14/2019: Taking 100 mg daily   . simvastatin (ZOCOR) 40 MG tablet TAKE 1 TABLET (40 MG TOTAL) BY MOUTH DAILY AT 6 PM.   . vitamin B-12 (CYANOCOBALAMIN) 1000 MCG tablet Take 1,000 mcg by mouth daily.    No facility-administered encounter medications on file as of 12/17/2019.     Objective:   Goals Addressed            This Visit's Progress     Patient Stated   . PharmD "I'm worried about my health" (pt-stated)       Current Barriers:  . Polypharmacy; complex patient with multiple comorbidities including CAD (hx stenting); PAD; depression, T2DM . Self-manages medications.  Denies affordability concerns o Depression: previously followed w/ psychiatry; currently taking sertraline 100 mg daily, lorazepam 0.5 mg 1 tab QPM, additional tab daily PRN anxiety.  o T2DM: follows w/ [facility]; see DM goal o CAD + PAD, HTN, HLD, ASCVD risk reduction: follows w/ Dr. Orene Desanctis; amlodipine 2.5 mg daily, HCTZ 12.5 mg daily, losartan 100 mg daily BP well at goal today simvastatin 40 mg daily; last LDL at goal <70; ASA 81 mg daily + clopidogrel 75 mg daily o Polymyalgia rheumatica: MTX 15 mg weekly + folic acid 1 mg daily o GERD: pantoprazole 40 mg daily  o Health Maintenance: Vitamin D, Vitamin B12, magnesium, multivitamin  Pharmacist Clinical Goal(s):   Marland Kitchen Over the next 90 days, patient will work with PharmD and provider towards optimized medication management  Interventions: . Comprehensive medication review performed; medication list updated in electronic medical record . Reviewed that increase in SSRI dose is likely appropriate at this time, and goal is to reduce need for BZD.   Patient Self Care Activities:  . Patient will take medications as prescribed  Please see past updates related to this goal by clicking on the "Past Updates" button in the selected goal      . PharmD "My sugars run high" (pt-stated)       Current Barriers:  . Diabetes: uncontrolled; complicated by chronic medical conditions including HTN, CAD/PAD (hx cardiac stenting); most recent A1c 8.8%; follows w/ [facility], next appointment 12/31/19 . Current antihyperglycemic regimen: Lantus 20 units daily, but patient reports that she is only taking 12-14 because she is worried about low blood sugars overnight; Novolog 18 units base + sliding scale TID (2 extra units for every 50 mg/dL over 150); metformin 500 mg BID o Denies hx of any non-insulin injectables . Current blood glucose readings:  o See CGM download above. Average BG 174. Above goal 40% of time . Current exercise: walking on treadmill 25-30 minutes daily  . Cardiovascular risk reduction: (followed by Dr. Orene Desanctis) o Current hypertensive regimen: amlodipine 2.5 mg daily, HCTZ 12.5 mg daily, losartan 100 mg daily o Current hyperlipidemia regimen: simvastatin 40 mg daily; last LDL at goal <70 o Current antiplatelet regimen: ASA 81 mg daily + clopidogrel 75 mg daily  Pharmacist Clinical Goal(s):  Marland Kitchen Over the next 90 days, patient will work with PharmD and primary care provider to address optimized   Interventions: . Comprehensive medication review performed, medication list updated in electronic medical record . Downloaded CGM, reviewed with patient and PCP. Indicates need for more basal coverage.  Educated patient on pharmacokinetics of basal + bolus insulin. Recommended she increase to 14-16, possibly 18 units daily, to target better control prior to appointment w/ PCP . Discussed benefit of SGLT2 and GLP1 based on ASCVD hx. Will communicate w/ endocrinology prior to patient's next appointment to suggest a discussion regarding benefit/risk of one of these option, and offer medication access support as needed  Patient Self Care Activities:  . Patient will check blood glucose at least QID using FreeStyle Libre , document, and provide at future appointments . Patient will take medications as prescribed . Patient will report any questions or concerns to provider   Please see past updates related to this goal by clicking on the "Past Updates" button in the selected goal          Plan:  - Will outreach [facility] endocrinology prior to patient's next appointment - Scheduled f/u call 01/21/20  Catie Darnelle Maffucci, PharmD, [facility] [employer] [phone]

## 2019-12-17 NOTE — Assessment & Plan Note (Signed)
Chronic, ongoing.  Denies SI/HI.  Have recommended she increase her Sertraline dose to 150 MG daily as is ordered, which she agrees to do.  This could benefit mood overall and help reduce benzo use.  Return in 3 months.

## 2019-12-17 NOTE — Assessment & Plan Note (Signed)
Chronic, stable.  Continue current medication regimen and adjust as needed.  Would benefit from reduction trial in future.  Check Mag level today.

## 2019-12-17 NOTE — Progress Notes (Signed)
BP 114/67 (BP Location: Left Arm, Patient Position: Sitting, Cuff Size: Normal)   Pulse 63   Temp 97.7 F (36.5 C) (Oral)   SpO2 96%    Subjective:    Patient ID: Laura Rojas, female    DOB: 1945-11-28, 74 y.o.   MRN: [id number]  HPI: Laura Rojas is a 74 y.o. female presenting on 12/17/2019 for comprehensive medical examination. Current medical complaints include:none  She currently lives with: husband Menopausal Symptoms: no   DIABETES Followed by endocrinology and last saw NP [facility] on 09/18/2019 with A1C 8.8%.  Lantus was increased from 18 to 20 units and Novolog to 18 units before meals + sliding scale.  She does endorse good/bad diet.  Has been taking 12 units of Lantus and not 20 units, due to concerns about increasing it. CCM PharmD in room today to educate patient on differences in insulin, she reports this helped ease her mind and will start the 20 units as was instructed.  She wears a Libre and her total average glucose was 174 over past weeks.  Takes daily B12 supplement due to history of low levels. Hypoglycemic episodes:no Polydipsia/polyuria: no Visual disturbance: no Chest pain: no Paresthesias: no Glucose Monitoring: yes  Accucheck frequency: Daily  Fasting glucose: 106 to 187   Post prandial:  Evening: 98 to 240  Before meals: Taking Insulin?: yes  Long acting insulin: 12 units, supposed to be 20 units  Short acting insulin:18 units with meals Blood Pressure Monitoring: rarely Retinal Examination: Up to Date Foot Exam: Up to Date Pneumovax: Up to Date Influenza: Up to Date Aspirin: yes   HYPERTENSION / HYPERLIPIDEMIA/CAD She is followed by cardiology, Dr. Sallyanne Kuster. Last saw 08/07/2019 with no changes.  Monitored by cardiology due to subclavian artery stenosis and angina.  Continues on Amlodipine, Plavix, HCTZ, Fish oil, Simvastatin and Losartan. Satisfied with current treatment? yes Duration of hypertension: chronic BP monitoring frequency:  daily BP range: 110-120/70 range BP medication side effects: no Duration of hyperlipidemia: chronic Cholesterol medication side effects: no Cholesterol supplements: fish oil Medication compliance: good compliance Aspirin: yes Recent stressors: no Recurrent headaches: no Visual changes: no Palpitations: no Dyspnea: no Chest pain: no Lower extremity edema: no Dizzy/lightheaded: no   CHRONIC KIDNEY DISEASE CKD status: stable Medications renally dose: yes Previous renal evaluation: no Pneumovax:  Up to Date Influenza Vaccine:  Up to Date   GERD GERD control status: stable  Satisfied with current treatment? yes Heartburn frequency: none Medication side effects: no  Medication compliance: stable Previous GERD medications: Antacid use frequency:   Duration:  Nature:  Location:  Heartburn duration:  Alleviatiating factors:   Aggravating factors:  Dysphagia: no Odynophagia:  no Hematemesis: no Blood in stool: no EGD: yes  POLYMYALGIA RHEUMATICA: Rheumatology started her on Methotrexate injectable 10/20/2019.   Pain control status: stable Duration: chronic Location:  Quality: dull and aching Current Pain Level: mild Breakthrough pain: no What Activities task can be accomplished with current medication? Able to function without difficulty Previous pain specialty evaluation: no Non-narcotic analgesic meds: yes  ANXIETY/STRESS Taking Zoloft 100 MG daily, although ordered at 150 MG which she reports she is unaware of.  Is taking Ativan 0.5 MG, takes this at night only, although ordered BID -- currently reports she takes 1 MG at night.  Occasionally will take extra dose at day time, but not often.  Pt aware of risks of psychoactive medication use to include increased sedation, respiratory suppression, falls, dependence and cardiovascular events.  Pt  would like to continue treatment as benefit determined to outweigh risk.  On review PMP last Ativan fill 11/02/2019 for 3 months  supply. Duration:stable Anxious mood: yes  Excessive worrying: no Irritability: no  Sweating: no Nausea: no Palpitations:no Hyperventilation: no Panic attacks: no Agoraphobia: no  Obscessions/compulsions: no Depressed mood: no Depression screen [facility] 2/9 12/17/2019 10/14/2019 08/27/2019 01/08/2019 12/17/2018  Decreased Interest 0 1 0 1 3  Down, Depressed, Hopeless 1 3 1 1 3   PHQ - 2 Score 1 4 1 2 6   Altered sleeping 3 3 - 2 3  Tired, decreased energy 1 3 - 2 3  Change in appetite 0 2 - 1 0  Feeling bad or failure about yourself  0 1 - 0 2  Trouble concentrating 0 0 - 0 0  Moving slowly or fidgety/restless 0 0 - 0 0  Suicidal thoughts 0 0 - 0 0  PHQ-9 Score 5 13 - 7 14  Difficult doing work/chores - Somewhat difficult - - -  Some recent data might be hidden   Anhedonia: no Weight changes: no Insomnia: yes hard to fall asleep  Hypersomnia: no Fatigue/loss of energy: no Feelings of worthlessness: no Feelings of guilt: no Impaired concentration/indecisiveness: no Suicidal ideations: no  Crying spells: no Recent Stressors/Life Changes: no   Relationship problems: no   Family stress: no     Financial stress: no    Job stress: no    Recent death/loss: no GAD 7 : Generalized Anxiety Score 10/14/2019 12/19/2017  Nervous, Anxious, on Edge 3 3  Control/stop worrying 3 2  Worry too much - different things 3 3  Trouble relaxing 3 3  Restless 0 3  Easily annoyed or irritable 3 3  Afraid - awful might happen 3 2  Total GAD 7 Score 18 19  Anxiety Difficulty Somewhat difficult Very difficult    The patient does not have a history of falls. I did not complete a risk assessment for falls. A plan of care for falls was not documented.   Past Medical History:  Past Medical History:  Diagnosis Date  . CAD (coronary artery disease)   . CAD (coronary artery disease) 12/31/2013   2007, 3.0 x 28 mm drug-eluting Cypher stent to RCA, 3.5 x 33 mm drug-eluting Cypher stent and proximal LAD)  Consent by 2009 Normal perfusion by mildly 2013   . Diabetes mellitus without complication ([place])   . Dyslipidemia   . Polymyalgia rheumatica syndrome ([place])   . Subclavian artery stenosis, left ([place]) 01/01/2015    Surgical History:  Past Surgical History:  Procedure Laterality Date  . BREAST EXCISIONAL BIOPSY Right 1970   EXCISIONAL - NEG  . BREAST EXCISIONAL BIOPSY Left 1973   EXCISIONAL - NEG  . BREAST SURGERY    . CARDIAC CATHETERIZATION  08/25/2008   patent stents  . CAROTID DUPLEX  10/2006   NORMAL CAROTID ARTERY DUPLEX  . COLONOSCOPY WITH PROPOFOL N/A 08/19/2019   Procedure: COLONOSCOPY WITH PROPOFOL;  Surgeon: Lucilla Lame, MD;  Location: [facility] ENDOSCOPY;  Service: Endoscopy;  Laterality: N/A;  . CORONARY ANGIOPLASTY WITH STENT PLACEMENT  09/17/2006   PTCA & stenting LAD  . ESOPHAGOGASTRODUODENOSCOPY N/A 04/27/2015   Procedure: ESOPHAGOGASTRODUODENOSCOPY (EGD);  Surgeon: Lucilla Lame, MD;  Location[facility] ENDOSCOPY;  Service: Endoscopy;  Laterality: N/A;  . JOINT REPLACEMENT    . NM MYOCAR MULTIPLE W/SPECT  11/2011   EF 72%.NORMAL MYOCARDIAL PERFUSION STUDY  . REPLACEMENT TOTAL KNEE  2003 & 2004  . TRANSTHORACIC ECHOCARDIOGRAM  09/2006   EF =>55%. IMPAIRED LV RELAXATION. MILD MITRAL ANNULAR CALCIFICATION. AV-MILDLY SCLEROTIC. NO AS.  Marland Kitchen VAGINAL HYSTERECTOMY  1983    Medications:  Current Outpatient Medications on File Prior to Visit  Medication Sig  . amLODipine (NORVASC) 2.5 MG tablet TAKE 1 TABLET BY MOUTH EVERY DAY  . aspirin 81 MG tablet Take 81 mg by mouth daily.  . cholecalciferol (VITAMIN D3) 25 MCG (1000 UT) tablet Take 1,000 Units by mouth daily.  . clopidogrel (PLAVIX) 75 MG tablet TAKE 1 TABLET BY MOUTH EVERY DAY  . ferrous sulfate 325 (65 FE) MG tablet Take 325 mg by mouth daily with breakfast.  . folic acid (FOLVITE) 1 MG tablet Take 1 mg by mouth daily.  . hydrochlorothiazide (MICROZIDE) 12.5 MG capsule Take 1 capsule (12.5 mg total) by mouth daily.  . Insulin  Glargine (BASAGLAR KWIKPEN) 100 UNIT/ML SOPN Inject 18 Units into the skin at bedtime.   Marland Kitchen LORazepam (ATIVAN) 0.5 MG tablet Take 1 tablet (0.5 mg total) by mouth 2 (two) times daily. Use the additional 30 tabs only sparingly and only for severe anxiety  . losartan (COZAAR) 100 MG tablet TAKE 1 TABLET BY MOUTH EVERY DAY  . Magnesium 400 MG TABS Take 1 tablet by mouth daily.  . metFORMIN (GLUCOPHAGE) 1000 MG tablet Take 500 mg by mouth 2 (two) times daily.   . methotrexate 50 MG/2ML injection Inject 15 mg into the skin once a week.  . Multiple Vitamin (MULTIVITAMIN) tablet Take 1 tablet by mouth daily.  Marland Kitchen NOVOLOG 100 UNIT/ML injection Inject 10 Units into the skin 3 (three) times daily.  . Omega-3 Fatty Acids (FISH OIL) 1200 MG CAPS Take 1,200 mg by mouth daily.  . pantoprazole (PROTONIX) 40 MG tablet TAKE 1 TABLET BY MOUTH EVERY DAY  . sertraline (ZOLOFT) 100 MG tablet Take 1.5 tablets (150 mg total) by mouth daily.  . simvastatin (ZOCOR) 40 MG tablet TAKE 1 TABLET (40 MG TOTAL) BY MOUTH DAILY AT 6 PM.  . vitamin B-12 (CYANOCOBALAMIN) 1000 MCG tablet Take 1,000 mcg by mouth daily.   No current facility-administered medications on file prior to visit.    Allergies:  Allergies  Allergen Reactions  . Compazine [Prochlorperazine Edisylate] Other (See Comments)    choking  . Altace [Ramipril]   . Crestor [Rosuvastatin Calcium] Other (See Comments)    Myalgias  . Reglan [Metoclopramide]   . Sulfa Antibiotics Other (See Comments)    headache  . Actos [Pioglitazone] Other (See Comments)    Hot Flashes  . Amlodipine Swelling  . Latex Rash  . Lipitor [Atorvastatin] Other (See Comments)    Cramps    Social History:  Social History   Socioeconomic History  . Marital status: Married    Spouse name: Not on file  . Number of children: Not on file  . Years of education: Not on file  . Highest education level: Some college, no degree  Occupational History  . Not on file  Tobacco Use  .  Smoking status: Never Smoker  . Smokeless tobacco: Never Used  Substance and Sexual Activity  . Alcohol use: No    Alcohol/week: 0.0 standard drinks  . Drug use: Never  . Sexual activity: Not on file  Other Topics Concern  . Not on file  Social History Narrative  . Not on file   Social Determinants of Health   Financial Resource Strain:   . Difficulty of Paying Living Expenses: Not on file  Food Insecurity:   .  Worried About Charity fundraiser in the Last Year: Not on file  . Ran Out of Food in the Last Year: Not on file  Transportation Needs:   . Lack of Transportation (Medical): Not on file  . Lack of Transportation (Non-Medical): Not on file  Physical Activity: Sufficiently Active  . Days of Exercise per Week: 5 days  . Minutes of Exercise per Session: 30 min  Stress:   . Feeling of Stress : Not on file  Social Connections:   . Frequency of Communication with Friends and Family: Not on file  . Frequency of Social Gatherings with Friends and Family: Not on file  . Attends Religious Services: Not on file  . Active Member of Clubs or Organizations: Not on file  . Attends Archivist Meetings: Not on file  . Marital Status: Not on file  Intimate Partner Violence:   . Fear of Current or Ex-Partner: Not on file  . Emotionally Abused: Not on file  . Physically Abused: Not on file  . Sexually Abused: Not on file   Social History   Tobacco Use  Smoking Status Never Smoker  Smokeless Tobacco Never Used   Social History   Substance and Sexual Activity  Alcohol Use No  . Alcohol/week: 0.0 standard drinks    Family History:  Family History  Problem Relation Age of Onset  . Breast cancer Mother 41  . Diabetes Sister   . Heart disease Maternal Grandmother   . Heart disease Maternal Grandfather   . Brain cancer Grandchild     Past medical history, surgical history, medications, allergies, family history and social history reviewed with patient today and  changes made to appropriate areas of the chart.   Review of Systems - negative All other ROS negative except what is listed above and in the HPI.      Objective:    BP 114/67 (BP Location: Left Arm, Patient Position: Sitting, Cuff Size: Normal)   Pulse 63   Temp 97.7 F (36.5 C) (Oral)   SpO2 96%   Wt Readings from Last 3 Encounters:  10/14/19 165 lb (74.8 kg)  08/27/19 165 lb (74.8 kg)  08/19/19 165 lb (74.8 kg)    Physical Exam Constitutional:      General: She is awake. She is not in acute distress.    Appearance: She is well-developed. She is not ill-appearing.  HENT:     Head: Normocephalic and atraumatic.     Right Ear: Hearing, tympanic membrane, ear canal and external ear normal. No drainage.     Left Ear: Hearing, tympanic membrane, ear canal and external ear normal. No drainage.     Nose: Nose normal.     Right Sinus: No maxillary sinus tenderness or frontal sinus tenderness.     Left Sinus: No maxillary sinus tenderness or frontal sinus tenderness.     Mouth/Throat:     Mouth: Mucous membranes are moist.     Pharynx: Oropharynx is clear. Uvula midline. No pharyngeal swelling, oropharyngeal exudate or posterior oropharyngeal erythema.  Eyes:     General: Lids are normal.        Right eye: No discharge.        Left eye: No discharge.     Extraocular Movements: Extraocular movements intact.     Conjunctiva/sclera: Conjunctivae normal.     Pupils: Pupils are equal, round, and reactive to light.     Visual Fields: Right eye visual fields normal and left eye visual fields  normal.  Neck:     Thyroid: No thyromegaly.     Vascular: No carotid bruit.     Trachea: Trachea normal.  Cardiovascular:     Rate and Rhythm: Normal rate and regular rhythm.     Heart sounds: Normal heart sounds. No murmur. No gallop.   Pulmonary:     Effort: Pulmonary effort is normal. No accessory muscle usage or respiratory distress.     Breath sounds: Normal breath sounds.  Chest:      Breasts:        Right: Normal.        Left: Normal.  Abdominal:     General: Bowel sounds are normal.     Palpations: Abdomen is soft. There is no hepatomegaly or splenomegaly.     Tenderness: There is no abdominal tenderness.  Musculoskeletal:        General: Normal range of motion.     Cervical back: Normal range of motion and neck supple.     Right lower leg: No edema.     Left lower leg: No edema.  Lymphadenopathy:     Head:     Right side of head: No submental, submandibular, tonsillar, preauricular or posterior auricular adenopathy.     Left side of head: No submental, submandibular, tonsillar, preauricular or posterior auricular adenopathy.     Cervical: No cervical adenopathy.     Upper Body:     Right upper body: No supraclavicular, axillary or pectoral adenopathy.     Left upper body: No supraclavicular, axillary or pectoral adenopathy.  Skin:    General: Skin is warm and dry.     Capillary Refill: Capillary refill takes less than 2 seconds.     Findings: No rash.  Neurological:     Mental Status: She is alert and oriented to person, place, and time.     Cranial Nerves: Cranial nerves are intact.     Gait: Gait is intact.     Deep Tendon Reflexes: Reflexes are normal and symmetric.     Reflex Scores:      Brachioradialis reflexes are 2+ on the right side and 2+ on the left side.      Patellar reflexes are 2+ on the right side and 2+ on the left side. Psychiatric:        Attention and Perception: Attention normal.        Mood and Affect: Mood normal.        Speech: Speech normal.        Behavior: Behavior normal. Behavior is cooperative.        Thought Content: Thought content normal.        Judgment: Judgment normal.    DM Foot Exam Color: normal Sensation Monofilament:normal right and left dorsal, plantar, toe and distal Sensation Vibration:normal right and left dorsal, plantar, toe and distal Circulation: Pulses normal right and left pedal and posterial tibial   Lesions: none   6CIT Screen 12/17/2019 08/21/2018 07/18/2017  What Year? 0 points 0 points 0 points  What month? 0 points 0 points 0 points  What time? 0 points 0 points 0 points  Count back from 20 0 points 0 points 0 points  Months in reverse 0 points 0 points 0 points  Repeat phrase 0 points 2 points 0 points  Total Score 0 2 0    Results for orders placed or performed in visit on 11/24/19  Hemoglobin A1c  Result Value Ref Range   Hemoglobin A1C 8.8%  Assessment & Plan:   Problem List Items Addressed This Visit      Cardiovascular and Mediastinum   CAD (coronary artery disease)    Continue collaboration with cardiology and preventative ASA + statin therapy.      Hypertension associated with diabetes ([place]) - Primary    Chronic, stable with BP at goal in office and at home.  Continue current medication regimen and collaboration with cardiology. CMP today.        Relevant Orders   Comprehensive metabolic panel   TSH   Subclavian artery stenosis, left (HCC)    Stable, continue collaboration with cardiology and preventative treatment ASA, Plavix, and statin.      Coronary artery disease of bypass graft of native heart with stable angina pectoris [facility])    Continue collaboration with cardiology and preventative treatment.        Digestive   Gastroesophageal reflux disease without esophagitis    Chronic, stable.  Continue current medication regimen and adjust as needed.  Would benefit from reduction trial in future.  Check Mag level today.      Relevant Orders   Magnesium     Endocrine   Hyperlipidemia associated with type 2 diabetes mellitus (HCC)    Chronic, ongoing.  Continue current medication regimen and adjust as needed.  Lipid panel and CMP today.      Relevant Orders   Lipid Panel w/o Chol/HDL Ratio   CKD stage 3 due to type 2 diabetes mellitus (HCC)    Chronic, ongoing.  Continue Losartan for kidney protection.  Urine micro in office ALB 10 and A:C  <30.  Continue collaboration with cardiology and endocrinology.      Type 2 diabetes mellitus with hyperglycemia, with long-term current use of insulin (HCC)    Chronic, ongoing, followed by endocrinology with recent A1C 8.8%.  Continue this collaboration and current medication regimen + collaboration with CCM team in office.  Patient is to start taking her Lantus 20 units, as was recommended by endocrinology.  Monitor for low BS <70, especially due to age.  Would benefit from trial of GLP ot SGLT (especially with cardiac health), will discuss with endocrinology.  Return to office in 3 months.      Relevant Orders   Microalbumin, Urine Waived   Comprehensive metabolic panel     Other   Anxiety disorder    Chronic, ongoing.  Denies SI/HI.  She is to start taking Sertraline 150 MG daily, as ordered, which would benefit mood and may help reduce Ativan use.  Has had multiple attempts at reduction of benzo with no success.  Discussed at length risks with her and she is aware.  Continue Ativan and fill upon request.  On review PMP last Ativan fill 11/02/2019 for 3 months supply. Return in 3 months, obtain UDS and contract next visit.      Fe deficiency anemia    Ongoing, continued daily iron and check CBC today.      Insomnia    Chronic, ongoing with use of Lorazepam nightly.  Has had trial reductions in past and other medications without success.  Is fully aware of risks of chronic benzo use and aware this will be discussed each visit.  Return in 3 months for UDS and contract.      Advanced care planning/counseling discussion    A voluntary discussion about advance care planning including the explanation and discussion of advance directives was extensively discussed  with the patient for 15 minutes with patient.  Explanation about the health care proxy and Living will was reviewed and packet with forms with explanation of how to fill them out was given.  During this discussion, the patient was  able to identify a health care proxy as her husband and plans to fill out the paperwork required.  Patient was offered a separate [place] visit for further assistance with forms.        Polymyalgia rheumatica (HCC)    Chronic, ongoing.  Continue collaboration with rheumatology and current medication regimen.        Pernicious anemia    Chronic, ongoing.  Continue supplement and recheck B12 level today.  Adjust as needed.      Relevant Orders   CBC with Differential/Platelet   Vitamin B12   Depression, recurrent (HCC)    Chronic, ongoing.  Denies SI/HI.  Have recommended she increase her Sertraline dose to 150 MG daily as is ordered, which she agrees to do.  This could benefit mood overall and help reduce benzo use.  Return in 3 months.       Other Visit Diagnoses    Annual physical exam       Vitamin D deficiency       Reports history of low level, continue supplement and check this today.   Relevant Orders   VITAMIN D 25 Hydroxy (Vit-D Deficiency, Fractures)       Follow up plan: Return in about 3 months (around 03/16/2020) for T2DM, HTN/HLD, Mood.   LABORATORY TESTING:  - Pap smear: not applicable  IMMUNIZATIONS:   - Tdap: Tetanus vaccination status reviewed: last tetanus booster within 10 years. - Influenza: Up to date - Pneumovax: Up to date - Prevnar: Up to date - HPV: Not applicable - Zostavax vaccine: Up to date  SCREENING: -Mammogram: Up to date  - Colonoscopy: Up to date  - Bone Density: Up to date --- 2015 normal -Hearing Test: Not applicable  -Spirometry: Not applicable   PATIENT COUNSELING:   Advised to take 1 mg of folate supplement per day if capable of pregnancy.   Sexuality: Discussed sexually transmitted diseases, partner selection, use of condoms, avoidance of unintended pregnancy  and contraceptive alternatives.   Advised to avoid cigarette smoking.  A voluntary discussion about advance care planning including the explanation  and discussion of advance directives was extensively discussed  with the patient for 15 minutes with patient.  Explanation about the health care proxy and Living will was reviewed and packet with forms with explanation of how to fill them out was given.  During this discussion, the patient was able to identify a health care proxy as her husband and plans to fill out the paperwork required.  Patient was offered a separate [place] visit for further assistance with forms.    I discussed with the patient that most people either abstain from alcohol or drink within safe limits (<=14/week and <=4 drinks/occasion for males, <=7/weeks and <= 3 drinks/occasion for females) and that the risk for alcohol disorders and other health effects rises proportionally with the number of drinks per week and how often a drinker exceeds daily limits.  Discussed cessation/primary prevention of drug use and availability of treatment for abuse.   Diet: Encouraged to adjust caloric intake to maintain  or achieve ideal body weight, to reduce intake of dietary saturated fat and total fat, to limit sodium intake by avoiding high sodium foods and not adding table salt, and to maintain adequate dietary potassium and calcium  preferably from fresh fruits, vegetables, and low-fat dairy products.    stressed the importance of regular exercise  Injury prevention: Discussed safety belts, safety helmets, smoke detector, smoking near bedding or upholstery.   Dental health: Discussed importance of regular tooth brushing, flossing, and dental visits.    NEXT PREVENTATIVE PHYSICAL DUE IN 1 YEAR. Return in about 3 months (around 03/16/2020) for T2DM, HTN/HLD, Mood.

## 2019-12-17 NOTE — Assessment & Plan Note (Signed)
A voluntary discussion about advance care planning including the explanation and discussion of advance directives was extensively discussed  with the patient for 15 minutes with patient.  Explanation about the health care proxy and Living will was reviewed and packet with forms with explanation of how to fill them out was given.  During this discussion, the patient was able to identify a health care proxy as her husband and plans to fill out the paperwork required.  Patient was offered a separate [place] visit for further assistance with forms.

## 2019-12-17 NOTE — Assessment & Plan Note (Signed)
Chronic, ongoing.  Continue collaboration with rheumatology and current medication regimen.

## 2019-12-17 NOTE — Assessment & Plan Note (Signed)
Chronic, ongoing.  Denies SI/HI.  She is to start taking Sertraline 150 MG daily, as ordered, which would benefit mood and may help reduce Ativan use.  Has had multiple attempts at reduction of benzo with no success.  Discussed at length risks with her and she is aware.  Continue Ativan and fill upon request.  On review PMP last Ativan fill 11/02/2019 for 3 months supply. Return in 3 months, obtain UDS and contract next visit.

## 2019-12-17 NOTE — Assessment & Plan Note (Signed)
Ongoing, continued daily iron and check CBC today.

## 2019-12-17 NOTE — Assessment & Plan Note (Signed)
Chronic, ongoing.  Continue current medication regimen and adjust as needed.  Lipid panel and CMP today.    

## 2019-12-17 NOTE — Assessment & Plan Note (Signed)
Stable, continue collaboration with cardiology and preventative treatment ASA, Plavix, and statin.

## 2019-12-17 NOTE — Assessment & Plan Note (Signed)
Chronic, ongoing.  Continue supplement and recheck B12 level today.  Adjust as needed.

## 2019-12-18 LAB — VITAMIN B12: Vitamin B-12: >2000 pg/mL — ABNORMAL HIGH (ref 232–1245)

## 2019-12-18 LAB — COMPREHENSIVE METABOLIC PANEL
ALT: 26 IU/L (ref 0–32)
AST: 28 IU/L (ref 0–40)
Albumin/Globulin Ratio: 2.8 — ABNORMAL HIGH (ref 1.2–2.2)
Albumin: 4.7 g/dL (ref 3.7–4.7)
Alkaline Phosphatase: 150 IU/L — ABNORMAL HIGH (ref 39–117)
BUN/Creatinine Ratio: 19 (ref 12–28)
BUN: 18 mg/dL (ref 8–27)
Bilirubin Total: 0.3 mg/dL (ref 0.0–1.2)
CO2: 23 mmol/L (ref 20–29)
Calcium: 9.7 mg/dL (ref 8.7–10.3)
Chloride: 102 mmol/L (ref 96–106)
Creatinine, Ser: 0.95 mg/dL (ref 0.57–1.00)
GFR calc Af Amer: 68 mL/min/{1.73_m2} (ref >59)
GFR calc non Af Amer: 59 mL/min/{1.73_m2} — ABNORMAL LOW (ref >59)
Globulin, Total: 1.7 g/dL (ref 1.5–4.5)
Glucose: 188 mg/dL — ABNORMAL HIGH (ref 65–99)
Potassium: 4.2 mmol/L (ref 3.5–5.2)
Sodium: 139 mmol/L (ref 134–144)
Total Protein: 6.4 g/dL (ref 6.0–8.5)

## 2019-12-18 LAB — CBC WITH DIFFERENTIAL/PLATELET
Basophils Absolute: 0.1 10*3/uL (ref 0.0–0.2)
Basos: 1 %
EOS (ABSOLUTE): 0.4 10*3/uL (ref 0.0–0.4)
Eos: 7 %
Hematocrit: 39.2 % (ref 34.0–46.6)
Hemoglobin: 12.6 g/dL (ref 11.1–15.9)
Immature Grans (Abs): 0 10*3/uL (ref 0.0–0.1)
Immature Granulocytes: 0 %
Lymphocytes Absolute: 1.6 10*3/uL (ref 0.7–3.1)
Lymphs: 32 %
MCH: 30.1 pg (ref 26.6–33.0)
MCHC: 32.1 g/dL (ref 31.5–35.7)
MCV: 94 fL (ref 79–97)
Monocytes Absolute: 0.4 10*3/uL (ref 0.1–0.9)
Monocytes: 8 %
Neutrophils Absolute: 2.6 10*3/uL (ref 1.4–7.0)
Neutrophils: 52 %
Platelets: 262 10*3/uL (ref 150–450)
RBC: 4.19 x10E6/uL (ref 3.77–5.28)
RDW: 15.6 % — ABNORMAL HIGH (ref 11.7–15.4)
WBC: 5.1 10*3/uL (ref 3.4–10.8)

## 2019-12-18 LAB — TSH: TSH: 2.99 u[IU]/mL (ref 0.450–4.500)

## 2019-12-18 LAB — LIPID PANEL W/O CHOL/HDL RATIO
Cholesterol, Total: 167 mg/dL (ref 100–199)
HDL: 53 mg/dL (ref >39)
LDL Chol Calc (NIH): 57 mg/dL (ref 0–99)
Triglycerides: 376 mg/dL — ABNORMAL HIGH (ref 0–149)
VLDL Cholesterol Cal: 57 mg/dL — ABNORMAL HIGH (ref 5–40)

## 2019-12-18 LAB — VITAMIN D 25 HYDROXY (VIT D DEFICIENCY, FRACTURES): Vit D, 25-Hydroxy: 32 ng/mL (ref 30.0–100.0)

## 2019-12-18 LAB — MAGNESIUM: Magnesium: 2 mg/dL (ref 1.6–2.3)

## 2019-12-18 NOTE — Progress Notes (Signed)
Contacted via MyChart

## 2019-12-24 DIAGNOSIS — M170 Bilateral primary osteoarthritis of knee: Secondary | ICD-10-CM | POA: Diagnosis not present

## 2019-12-24 DIAGNOSIS — M2550 Pain in unspecified joint: Secondary | ICD-10-CM | POA: Diagnosis not present

## 2019-12-24 DIAGNOSIS — Z96653 Presence of artificial knee joint, bilateral: Secondary | ICD-10-CM | POA: Diagnosis not present

## 2019-12-24 DIAGNOSIS — Z79899 Other long term (current) drug therapy: Secondary | ICD-10-CM | POA: Diagnosis not present

## 2019-12-24 DIAGNOSIS — M0609 Rheumatoid arthritis without rheumatoid factor, multiple sites: Secondary | ICD-10-CM | POA: Diagnosis not present

## 2019-12-24 DIAGNOSIS — R768 Other specified abnormal immunological findings in serum: Secondary | ICD-10-CM | POA: Diagnosis not present

## 2019-12-30 ENCOUNTER — Telehealth: Payer: Self-pay | Admitting: Pharmacist

## 2019-12-30 ENCOUNTER — Ambulatory Visit: Payer: Self-pay | Admitting: Pharmacist

## 2019-12-30 DIAGNOSIS — E1165 Type 2 diabetes mellitus with hyperglycemia: Secondary | ICD-10-CM

## 2019-12-30 DIAGNOSIS — I2510 Atherosclerotic heart disease of native coronary artery without angina pectoris: Secondary | ICD-10-CM

## 2019-12-30 NOTE — Chronic Care Management (AMB) (Signed)
Chronic Care Management   Follow Up Note   12/30/2019 Name: Laura Rojas MRN: [id number] DOB: 16-Jan-1946  Referred by: Venita Lick, NP Reason for referral : Chronic Care Management (Medication Management)   Laura Rojas is a 74 y.o. year old female who is a primary care patient of Cannady, Barbaraann Faster, NP. The CCM team was consulted for assistance with chronic disease management and care coordination needs.    Care coordination completed today.   Review of patient status, including review of consultants reports, relevant laboratory and other test results, and collaboration with appropriate care team members and the patient's provider was performed as part of comprehensive patient evaluation and provision of chronic care management services.    SDOH (Social Determinants of Health) screening performed today: Financial Strain . See Care Plan for related entries.   Outpatient Encounter Medications as of 12/30/2019  Medication Sig Note  . amLODipine (NORVASC) 2.5 MG tablet TAKE 1 TABLET BY MOUTH EVERY DAY   . aspirin 81 MG tablet Take 81 mg by mouth daily.   . cholecalciferol (VITAMIN D3) 25 MCG (1000 UT) tablet Take 1,000 Units by mouth daily.   . clopidogrel (PLAVIX) 75 MG tablet TAKE 1 TABLET BY MOUTH EVERY DAY   . ferrous sulfate 325 (65 FE) MG tablet Take 325 mg by mouth daily with breakfast.   . folic acid (FOLVITE) 1 MG tablet Take 1 mg by mouth daily.   . hydrochlorothiazide (MICROZIDE) 12.5 MG capsule Take 1 capsule (12.5 mg total) by mouth daily.   . Insulin Glargine (BASAGLAR KWIKPEN) 100 UNIT/ML SOPN Inject 18 Units into the skin at bedtime.    Marland Kitchen LORazepam (ATIVAN) 0.5 MG tablet Take 1 tablet (0.5 mg total) by mouth 2 (two) times daily. Use the additional 30 tabs only sparingly and only for severe anxiety 10/31/2019: Taking QPM; using additional dose of QAM 1-3 days per week  . losartan (COZAAR) 100 MG tablet TAKE 1 TABLET BY MOUTH EVERY DAY   . Magnesium 400 MG TABS Take 1  tablet by mouth daily.   . metFORMIN (GLUCOPHAGE) 1000 MG tablet Take 500 mg by mouth 2 (two) times daily.    . methotrexate 50 MG/2ML injection Inject 15 mg into the skin once a week.   . Multiple Vitamin (MULTIVITAMIN) tablet Take 1 tablet by mouth daily.   Marland Kitchen NOVOLOG 100 UNIT/ML injection Inject 10 Units into the skin 3 (three) times daily. 10/31/2019: 16-24   . Omega-3 Fatty Acids (FISH OIL) 1200 MG CAPS Take 1,200 mg by mouth daily.   . pantoprazole (PROTONIX) 40 MG tablet TAKE 1 TABLET BY MOUTH EVERY DAY   . sertraline (ZOLOFT) 100 MG tablet Take 1.5 tablets (150 mg total) by mouth daily. 10/14/2019: Taking 100 mg daily   . simvastatin (ZOCOR) 40 MG tablet TAKE 1 TABLET (40 MG TOTAL) BY MOUTH DAILY AT 6 PM.   . vitamin B-12 (CYANOCOBALAMIN) 1000 MCG tablet Take 1,000 mcg by mouth daily.    No facility-administered encounter medications on file as of 12/30/2019.     Objective:   Goals Addressed            This Visit's Progress     Patient Stated   . PharmD "My sugars run high" (pt-stated)       Current Barriers:  . Diabetes: uncontrolled; complicated by chronic medical conditions including HTN, CAD/PAD (hx cardiac stenting); most recent A1c 8.8%; follows w/ [facility] Endocrinology, next appointment tomorrow . Current antihyperglycemic regimen: Lantus 20  units daily, Novolog 18 units base + sliding scale TID (2 extra units for every 50 mg/dL over 150); metformin 500 mg BID o Denies hx of any non-insulin injectables . Cardiovascular risk reduction: (followed by Dr. Orene Desanctis) o Current hypertensive regimen: amlodipine 2.5 mg daily, HCTZ 12.5 mg daily, losartan 100 mg daily o Current hyperlipidemia regimen: simvastatin 40 mg daily; last LDL at goal <70 o Current antiplatelet regimen: ASA 81 mg daily + clopidogrel 75 mg daily  Pharmacist Clinical Goal(s):  Marland Kitchen Over the next 90 days, patient will work with PharmD and primary care provider to address optimized   Interventions: . As previously  discussed, contacted [facility] End[facility]inology and left a message for [facility], noting that we discussed SGLT2 and GLP1, and that patient was interested. Discussed that I would be happy to help w/ patient assistance as needed, and left my call back information.  Patient Self Care Activities:  . Patient will check blood glucose at least QID using FreeStyle Libre , document, and provide at future appointments . Patient will take medications as prescribed . Patient will report any questions or concerns to provider   Please see past updates related to this goal by clicking on the "Past Updates" button in the selected goal          Plan:  - Will follow up with patient as previously scheduled  Catie Darnelle Maffucci, PharmD, [place] [phone]

## 2019-12-30 NOTE — Patient Instructions (Signed)
Visit Information  Goals Addressed            This Visit's Progress     Patient Stated   . PharmD "My sugars run high" (pt-stated)       Current Barriers:  . Diabetes: uncontrolled; complicated by chronic medical conditions including HTN, CAD/PAD (hx cardiac stenting); most recent A1c 8.8%; follows w/ [facility] Endocrinology, next appointment tomorrow . Current antihyperglycemic regimen: Lantus 20 units daily, Novolog 18 units base + sliding scale TID (2 extra units for every 50 mg/dL over 150); metformin 500 mg BID o Denies hx of any non-insulin injectables . Cardiovascular risk reduction: (followed by Dr. Orene Desanctis) o Current hypertensive regimen: amlodipine 2.5 mg daily, HCTZ 12.5 mg daily, losartan 100 mg daily o Current hyperlipidemia regimen: simvastatin 40 mg daily; last LDL at goal <70 o Current antiplatelet regimen: ASA 81 mg daily + clopidogrel 75 mg daily  Pharmacist Clinical Goal(s):  Marland Kitchen Over the next 90 days, patient will work with PharmD and primary care provider to address optimized   Interventions: . As previously discussed, contacted [facility] End[facility]in[facility] and left a message for [facility], noting that we discussed SGLT2 and GLP1, and that patient was interested. Discussed that I would be happy to help w/ patient assistance as needed, and left my call back information.  Patient Self Care Activities:  . Patient will check blood glucose at least QID using FreeStyle Libre , document, and provide at future appointments . Patient will take medications as prescribed . Patient will report any questions or concerns to provider   Please see past updates related to this goal by clicking on the "Past Updates" button in the selected goal         The patient verbalized understanding of instructions provided today and declined a print copy of patient instruction materials.   Plan:  - Will follow up with patient as previously scheduled   Catie Darnelle Maffucci, PharmD, [place]  [phone]

## 2019-12-30 NOTE — Telephone Encounter (Signed)
Error

## 2019-12-31 DIAGNOSIS — I10 Essential (primary) hypertension: Secondary | ICD-10-CM | POA: Diagnosis not present

## 2019-12-31 DIAGNOSIS — E1159 Type 2 diabetes mellitus with other circulatory complications: Secondary | ICD-10-CM | POA: Diagnosis not present

## 2019-12-31 DIAGNOSIS — E1169 Type 2 diabetes mellitus with other specified complication: Secondary | ICD-10-CM | POA: Diagnosis not present

## 2019-12-31 DIAGNOSIS — E785 Hyperlipidemia, unspecified: Secondary | ICD-10-CM | POA: Diagnosis not present

## 2019-12-31 DIAGNOSIS — Z794 Long term (current) use of insulin: Secondary | ICD-10-CM | POA: Diagnosis not present

## 2019-12-31 DIAGNOSIS — E1165 Type 2 diabetes mellitus with hyperglycemia: Secondary | ICD-10-CM | POA: Diagnosis not present

## 2019-12-31 LAB — HEMOGLOBIN A1C: Hemoglobin A1C: 8.2

## 2020-01-21 ENCOUNTER — Ambulatory Visit (INDEPENDENT_AMBULATORY_CARE_PROVIDER_SITE_OTHER): Payer: Medicare Other | Admitting: Pharmacist

## 2020-01-21 DIAGNOSIS — Z794 Long term (current) use of insulin: Secondary | ICD-10-CM | POA: Diagnosis not present

## 2020-01-21 DIAGNOSIS — I2510 Atherosclerotic heart disease of native coronary artery without angina pectoris: Secondary | ICD-10-CM

## 2020-01-21 DIAGNOSIS — E1165 Type 2 diabetes mellitus with hyperglycemia: Secondary | ICD-10-CM | POA: Diagnosis not present

## 2020-01-21 NOTE — Patient Instructions (Addendum)
Laura Rojas,   It was great talking to you briefly today! Just wanted to review what we discussed:   1) I would recommend NOT completely stopping the once daily insulin, rather, use a lower dose every evening. The goal would be to use less of the mealtime insulin to control your sugars. You likely still need the once daily insulin working in the background for a full 24 hours to help control sugars. Please contact Dr. Joycie Peek office, our office, or me directly with blood sugar readings if you need some guidance with dosing.   2) We may be able to get the once daily insulin, mealtime insulin, and Trulicity for free from the drug company. Their income cut off for 2021 for a 2 person household is $69,680. Please let me know if you and Laura Rojas would qualify for this.   I've put down an appointment to call you on Wednesday, March 10th at 3:30 pm to more fully review blood sugars and how you are doing since starting Trulicity. Give me a call if this won't work, and we can reschedule.   Call with any other questions or concerns!  Visit Information  Goals Addressed            This Visit's Progress     Patient Stated   . PharmD "My sugars run high" (pt-stated)       CARE PLAN ENTRY (see longtitudinal plan of care for additional care plan information)  Current Barriers:  . Diabetes: uncontrolled; complicated by chronic medical conditions including HTN, CAD/PAD (hx cardiac stenting); most recent A1c 8.2%; follows w/ [facility] Endocrinology o Started on Trulicity A999333 mg weekly at last visit. Notes she has done 1 injection, and this has already had a great impact on her sugars. . Current antihyperglycemic regimen: Trulicity A999333 mg weekly, metformin 500 mg BID, Basaglar 20 units daily, Novolog 18 units base + sliding scale TID (2 extra units for every 50 mg/dL over 150); - notes that she has STOPPED Basaglar because "my sugars don't need it" and only uses Novolog with breakfast (likely correction d/t  higher fastings d/t not taking basal insulin) and supper. . Cardiovascular risk reduction: (followed by Dr. Orene Desanctis) o Current hypertensive regimen: amlodipine 2.5 mg daily, HCTZ 12.5 mg daily, losartan 100 mg daily o Current hyperlipidemia regimen: simvastatin 40 mg daily; last LDL at goal <70 o Current antiplatelet regimen: ASA 81 mg daily + clopidogrel 75 mg daily  Pharmacist Clinical Goal(s):  Marland Kitchen Over the next 90 days, patient will work with PharmD and primary care provider to address optimized   Interventions: . Reviewed benefits of GLP1. Recommended she restart Basaglar, but use a smaller amount, such as 12-14 units instead of 18, to reduce the dosing of bolus insulin needed. Explained risk of hypoglycemia is higher with bolus insulin. She verbalized understanding . 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 She will review this with her husband to determine if they would be eligible for assistance to reduce costs and prevent her from going into Medicare Coverage Gap  Patient Self Care Activities:  . Patient will check blood glucose at least QID using FreeStyle Libre , document, and provide at future appointments . Patient will take medications as prescribed . Patient will report any questions or concerns to provider   Please see past updates related to this goal by clicking on the "Past Updates" button in the selected goal  Patient verbalizes understanding of instructions provided today.   Plan:  - Scheduled f/u call 02/04/20 for further review of medication management  Catie Darnelle Maffucci, PharmD, [place] [phone]

## 2020-01-21 NOTE — Chronic Care Management (AMB) (Signed)
Chronic Care Management   Follow Up Note   01/21/2020 Name: Laura Rojas MRN: [id number] DOB: [birth year]  Referred by: Venita Lick, NP Reason for referral : Chronic Care Management (Medication Management)   Laura Rojas is a 74 y.o. year old female who is a primary care patient of Cannady, Barbaraann Faster, NP. The CCM team was consulted for assistance with chronic disease management and care coordination needs.    Contacted patient for medication management review. She noted that she couldn't talk long, she was about to leave the house with someone.  Review of patient status, including review of consultants reports, relevant laboratory and other test results, and collaboration with appropriate care team members and the patient's provider was performed as part of comprehensive patient evaluation and provision of chronic care management services.    SDOH (Social Determinants of Health) assessments performed: Yes See Care Plan activities for detailed interventions related to SDOH)  SDOH Interventions     Most Recent Value  SDOH Interventions  SDOH Interventions for the Following Domains  Financial Strain  Financial Strain Interventions  Other (Comment) [medication assistance eligibility review]       Outpatient Encounter Medications as of 01/21/2020  Medication Sig Note  . NOVOLOG 100 UNIT/ML injection Inject 10 Units into the skin 3 (three) times daily. 01/21/2020: Needing 2 doses (~20 units each) QAM and QPM  . amLODipine (NORVASC) 2.5 MG tablet TAKE 1 TABLET BY MOUTH EVERY DAY   . aspirin 81 MG tablet Take 81 mg by mouth daily.   . cholecalciferol (VITAMIN D3) 25 MCG (1000 UT) tablet Take 1,000 Units by mouth daily.   . clopidogrel (PLAVIX) 75 MG tablet TAKE 1 TABLET BY MOUTH EVERY DAY   . ferrous sulfate 325 (65 FE) MG tablet Take 325 mg by mouth daily with breakfast.   . folic acid (FOLVITE) 1 MG tablet Take 1 mg by mouth daily.   . hydrochlorothiazide (MICROZIDE) 12.5 MG  capsule Take 1 capsule (12.5 mg total) by mouth daily.   . Insulin Glargine (BASAGLAR KWIKPEN) 100 UNIT/ML SOPN Inject 18 Units into the skin at bedtime.    Marland Kitchen LORazepam (ATIVAN) 0.5 MG tablet Take 1 tablet (0.5 mg total) by mouth 2 (two) times daily. Use the additional 30 tabs only sparingly and only for severe anxiety 10/31/2019: Taking QPM; using additional dose of QAM 1-3 days per week  . losartan (COZAAR) 100 MG tablet TAKE 1 TABLET BY MOUTH EVERY DAY   . Magnesium 400 MG TABS Take 1 tablet by mouth daily.   . metFORMIN (GLUCOPHAGE) 1000 MG tablet Take 500 mg by mouth 2 (two) times daily.    . methotrexate 50 MG/2ML injection Inject 15 mg into the skin once a week.   . Multiple Vitamin (MULTIVITAMIN) tablet Take 1 tablet by mouth daily.   . Omega-3 Fatty Acids (FISH OIL) 1200 MG CAPS Take 1,200 mg by mouth daily.   . pantoprazole (PROTONIX) 40 MG tablet TAKE 1 TABLET BY MOUTH EVERY DAY   . sertraline (ZOLOFT) 100 MG tablet Take 1.5 tablets (150 mg total) by mouth daily. 10/14/2019: Taking 100 mg daily   . simvastatin (ZOCOR) 40 MG tablet TAKE 1 TABLET (40 MG TOTAL) BY MOUTH DAILY AT 6 PM.   . TRULICITY A999333 0000000 SOPN Inject 0.75 mg into the skin once a week.   . vitamin B-12 (CYANOCOBALAMIN) 1000 MCG tablet Take 1,000 mcg by mouth daily.    No facility-administered encounter medications on file as of  01/21/2020.     Objective:   Goals Addressed            This Visit's Progress     Patient Stated   . PharmD "My sugars run high" (pt-stated)       CARE PLAN ENTRY (see longtitudinal plan of care for additional care plan information)  Current Barriers:  . Diabetes: uncontrolled; complicated by chronic medical conditions including HTN, CAD/PAD (hx cardiac stenting); most recent A1c 8.2%; follows w/ [facility] Endocrinology o Started on Trulicity A999333 mg weekly at last visit. Notes she has done 1 injection, and this has already had a great impact on her sugars. . Current antihyperglycemic  regimen: Trulicity A999333 mg weekly, metformin 500 mg BID, Basaglar 20 units daily, Novolog 18 units base + sliding scale TID (2 extra units for every 50 mg/dL over 150); - notes that she has STOPPED Basaglar because "my sugars don't need it" and only uses Novolog with breakfast (likely correction d/t higher fastings d/t not taking basal insulin) and supper. . Cardiovascular risk reduction: (followed by Dr. Orene Desanctis) o Current hypertensive regimen: amlodipine 2.5 mg daily, HCTZ 12.5 mg daily, losartan 100 mg daily o Current hyperlipidemia regimen: simvastatin 40 mg daily; last LDL at goal <70 o Current antiplatelet regimen: ASA 81 mg daily + clopidogrel 75 mg daily  Pharmacist Clinical Goal(s):  Marland Kitchen Over the next 90 days, patient will work with PharmD and primary care provider to address optimized   Interventions: . Reviewed benefits of GLP1. Recommended she restart Basaglar, but use a smaller amount, such as 12-14 units instead of 18, to reduce the dosing of bolus insulin needed. Explained risk of hypoglycemia is higher with bolus insulin. She verbalized understanding . 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 She will review this with her husband to determine if they would be eligible for assistance to reduce costs and prevent her from going into Medicare Coverage Gap  Patient Self Care Activities:  . Patient will check blood glucose at least QID using FreeStyle Libre , document, and provide at future appointments . Patient will take medications as prescribed . Patient will report any questions or concerns to provider   Please see past updates related to this goal by clicking on the "Past Updates" button in the selected goal          Plan:  - Scheduled f/u call 02/04/20 for further review of medication management  Catie Darnelle Maffucci, PharmD, [place] [phone]

## 2020-01-29 ENCOUNTER — Other Ambulatory Visit: Payer: Self-pay

## 2020-01-29 MED ORDER — LORAZEPAM 0.5 MG PO TABS: 0.5000 mg | ORAL_TABLET | Freq: Two times a day (BID) | ORAL | 0 refills | Status: DC

## 2020-01-29 NOTE — Telephone Encounter (Signed)
Refill request for Lorazepam LOV: 12/17/2019 Next Appt: 03/17/2020

## 2020-02-04 ENCOUNTER — Ambulatory Visit: Payer: Medicare Other | Admitting: Pharmacist

## 2020-02-04 DIAGNOSIS — F413 Other mixed anxiety disorders: Secondary | ICD-10-CM

## 2020-02-04 DIAGNOSIS — E1165 Type 2 diabetes mellitus with hyperglycemia: Secondary | ICD-10-CM

## 2020-02-04 DIAGNOSIS — F5101 Primary insomnia: Secondary | ICD-10-CM

## 2020-02-04 DIAGNOSIS — R768 Other specified abnormal immunological findings in serum: Secondary | ICD-10-CM | POA: Diagnosis not present

## 2020-02-04 DIAGNOSIS — M0609 Rheumatoid arthritis without rheumatoid factor, multiple sites: Secondary | ICD-10-CM | POA: Diagnosis not present

## 2020-02-04 DIAGNOSIS — Z79899 Other long term (current) drug therapy: Secondary | ICD-10-CM | POA: Diagnosis not present

## 2020-02-04 NOTE — Patient Instructions (Signed)
Visit Information  Goals Addressed            This Visit's Progress     Patient Stated   . PharmD "My sugars run high" (pt-stated)       CARE PLAN ENTRY (see longtitudinal plan of care for additional care plan information)  Current Barriers:  . Diabetes: uncontrolled; complicated by chronic medical conditions including HTN, CAD/PAD (hx cardiac stenting); most recent A1c 8.2%; follows w/ [facility] Endocrinology . Current antihyperglycemic regimen: Trulicity A999333 mg weekly, metformin 500 mg BID, Basaglar 8-16 units daily, Novolog 18 units base + sliding scale TID (2 extra units for every 50 mg/dL over 150);  o Notes that she has been skipping Basaglar on evenings that her bedtime sugar is low.  o States she has been using ~16 units Novolog TID . Reports episodes of hypoglycemia overnight. She has been having to get up and eat snacks to treat hypoglycemia, which results in hypoglycemia . Cardiovascular risk reduction: (followed by Dr. Orene Desanctis) o Current hypertensive regimen: amlodipine 2.5 mg daily, HCTZ 12.5 mg daily, losartan 100 mg daily o Current hyperlipidemia regimen: simvastatin 40 mg daily; last LDL at goal <70 o Current antiplatelet regimen: ASA 81 mg daily + clopidogrel 75 mg daily . Depression/anxiety: has NOT increased sertraline from 100 mg daily to 150 mg daily as instructed. Notes that she feels a bit more irritable lately, but that her husband denies any change.  Pharmacist Clinical Goal(s):  Marland Kitchen Over the next 90 days, patient will work with PharmD and primary care provider to address optimized   Interventions: . Comprehensive medication review performed, medication list updated in electronic medical record . Reviewed hypoglycemia over night, skipping basal insulin. Likely needs instruction on reducing Novolog sliding scale. Encouraged her to call [facility] to ask for an earlier appointment w/ Warnell Forester, prior to currently scheduled May appointment. She noted that she would  call and do that today.  . Encouraged adherence to [place], but to discuss lower doses with endocrinology . Notes that she isn't experiencing any weight loss with Trulicity, but endorses "snacking a lot" at night to keep her sugars up.  . Reiterated increasing sertraline to 1.5 tabs daily. She notes that she wrote down this instruction and will start doing so.   Patient Self Care Activities:  . Patient will check blood glucose at least QID using FreeStyle Libre , document, and provide at future appointments . Patient will take medications as prescribed . Patient will report any questions or concerns to provider   Please see past updates related to this goal by clicking on the "Past Updates" button in the selected goal         Patient verbalizes understanding of instructions provided today.   Plan:  - Will meet with patient face to face at next appointment w/ PCP on 03/17/20  Catie Darnelle Maffucci, PharmD, [place] [phone]

## 2020-02-04 NOTE — Chronic Care Management (AMB) (Signed)
Chronic Care Management   Follow Up Note   02/04/2020 Name: Laura Rojas MRN: [id number] DOB: 1946-01-16  Referred by: Venita Lick, NP Reason for referral : Chronic Care Management (Medication Management)   Laura Rojas is a 74 y.o. year old female who is a primary care patient of Cannady, Laura Faster, NP. The CCM team was consulted for assistance with chronic disease management and care coordination needs.    Contacted patient for medication management review.   Review of patient status, including review of consultants reports, relevant laboratory and other test results, and collaboration with appropriate care team members and the patient's provider was performed as part of comprehensive patient evaluation and provision of chronic care management services.    SDOH (Social Determinants of Health) assessments performed: Yes See Care Plan activities for detailed interventions related to [facility])     Outpatient Encounter Medications as of 02/04/2020  Medication Sig Note  . amLODipine (NORVASC) 2.5 MG tablet TAKE 1 TABLET BY MOUTH EVERY DAY   . aspirin 81 MG tablet Take 81 mg by mouth daily.   . cholecalciferol (VITAMIN D3) 25 MCG (1000 UT) tablet Take 1,000 Units by mouth daily.   . clopidogrel (PLAVIX) 75 MG tablet TAKE 1 TABLET BY MOUTH EVERY DAY   . ferrous sulfate 325 (65 FE) MG tablet Take 325 mg by mouth daily with breakfast.   . folic acid (FOLVITE) 1 MG tablet Take 1 mg by mouth daily.   . hydrochlorothiazide (MICROZIDE) 12.5 MG capsule Take 1 capsule (12.5 mg total) by mouth daily.   . Insulin Glargine (BASAGLAR KWIKPEN) 100 UNIT/ML SOPN Inject 18 Units into the skin at bedtime.  02/04/2020: 8-16 units  . LORazepam (ATIVAN) 0.5 MG tablet Take 1 tablet (0.5 mg total) by mouth 2 (two) times daily. Use the additional 30 tabs only sparingly and only for severe anxiety   . losartan (COZAAR) 100 MG tablet TAKE 1 TABLET BY MOUTH EVERY DAY   . Magnesium 400 MG TABS Take 1 tablet by  mouth daily.   . metFORMIN (GLUCOPHAGE) 500 MG tablet Take 500 mg by mouth 2 (two) times daily.    . methotrexate 50 MG/2ML injection Inject 15 mg into the skin once a week.   . Multiple Vitamin (MULTIVITAMIN) tablet Take 1 tablet by mouth daily.   Marland Kitchen NOVOLOG 100 UNIT/ML injection Inject 10 Units into the skin 3 (three) times daily. 02/04/2020: Taking 16 units TID   . Omega-3 Fatty Acids (FISH OIL) 1200 MG CAPS Take 1,200 mg by mouth daily.   . pantoprazole (PROTONIX) 40 MG tablet TAKE 1 TABLET BY MOUTH EVERY DAY   . sertraline (ZOLOFT) 100 MG tablet Take 1.5 tablets (150 mg total) by mouth daily.   . simvastatin (ZOCOR) 40 MG tablet TAKE 1 TABLET (40 MG TOTAL) BY MOUTH DAILY AT 6 PM.   . TRULICITY A999333 0000000 SOPN Inject 0.75 mg into the skin once a week.   . vitamin B-12 (CYANOCOBALAMIN) 1000 MCG tablet Take 1,000 mcg by mouth daily.    No facility-administered encounter medications on file as of 02/04/2020.     Objective:   Goals Addressed            This Visit's Progress     Patient Stated   . PharmD "My sugars run high" (pt-stated)       CARE PLAN ENTRY (see longtitudinal plan of care for additional care plan information)  Current Barriers:  . Diabetes: uncontrolled; complicated by chronic medical  conditions including HTN, CAD/PAD (hx cardiac stenting); most recent A1c 8.2%; follows w/ [facility] . Current antihyperglycemic regimen: Trulicity A999333 mg weekly, metformin 500 mg BID, Basaglar 8-16 units daily, Novolog 18 units base + sliding scale TID (2 extra units for every 50 mg/dL over 150);  o Notes that she has been skipping Basaglar on evenings that her bedtime sugar is low.  o States she has been using ~16 units Novolog TID . Reports episodes of hypoglycemia overnight. She has been having to get up and eat snacks to treat hypoglycemia, which results in hypoglycemia . Cardiovascular risk reduction: (followed by Dr. Orene Desanctis) o Current hypertensive regimen: amlodipine  2.5 mg daily, HCTZ 12.5 mg daily, losartan 100 mg daily o Current hyperlipidemia regimen: simvastatin 40 mg daily; last LDL at goal <70 o Current antiplatelet regimen: ASA 81 mg daily + clopidogrel 75 mg daily . Depression/anxiety: has NOT increased sertraline from 100 mg daily to 150 mg daily as instructed. Notes that she feels a bit more irritable lately, but that her husband denies any change.  Pharmacist Clinical Goal(s):  Marland Kitchen Over the next 90 days, patient will work with PharmD and primary care provider to address optimized   Interventions: . Comprehensive medication review performed, medication list updated in electronic medical record . Reviewed hypoglycemia over night, skipping basal insulin. Likely needs instruction on reducing Novolog sliding scale. Encouraged her to call [facility] to ask for an earlier appointment w/ Warnell Forester, prior to currently scheduled May appointment. She noted that she would call and do that today.  . Encouraged adherence to [place], but to discuss lower doses with endocrinology . Notes that she isn't experiencing any weight loss with Trulicity, but endorses "snacking a lot" at night to keep her sugars up.  . Reiterated increasing sertraline to 1.5 tabs daily. She notes that she wrote down this instruction and will start doing so.   Patient Self Care Activities:  . Patient will check blood glucose at least QID using FreeStyle Libre , document, and provide at future appointments . Patient will take medications as prescribed . Patient will report any questions or concerns to provider   Please see past updates related to this goal by clicking on the "Past Updates" button in the selected goal          Plan:  - Will meet with patient face to face at next appointment w/ PCP on 03/17/20  Catie Darnelle Maffucci, PharmD, [place] [phone]

## 2020-02-11 DIAGNOSIS — E1165 Type 2 diabetes mellitus with hyperglycemia: Secondary | ICD-10-CM | POA: Diagnosis not present

## 2020-02-11 DIAGNOSIS — E785 Hyperlipidemia, unspecified: Secondary | ICD-10-CM | POA: Diagnosis not present

## 2020-02-11 DIAGNOSIS — Z794 Long term (current) use of insulin: Secondary | ICD-10-CM | POA: Diagnosis not present

## 2020-02-11 DIAGNOSIS — I10 Essential (primary) hypertension: Secondary | ICD-10-CM | POA: Diagnosis not present

## 2020-02-11 DIAGNOSIS — Z7952 Long term (current) use of systemic steroids: Secondary | ICD-10-CM | POA: Diagnosis not present

## 2020-02-11 DIAGNOSIS — E1169 Type 2 diabetes mellitus with other specified complication: Secondary | ICD-10-CM | POA: Diagnosis not present

## 2020-02-11 DIAGNOSIS — E1159 Type 2 diabetes mellitus with other circulatory complications: Secondary | ICD-10-CM | POA: Diagnosis not present

## 2020-02-26 DIAGNOSIS — H2511 Age-related nuclear cataract, right eye: Secondary | ICD-10-CM | POA: Diagnosis not present

## 2020-02-26 LAB — HM DIABETES EYE EXAM

## 2020-03-09 ENCOUNTER — Other Ambulatory Visit: Payer: Self-pay | Admitting: Nurse Practitioner

## 2020-03-17 ENCOUNTER — Ambulatory Visit (INDEPENDENT_AMBULATORY_CARE_PROVIDER_SITE_OTHER): Payer: Medicare Other | Admitting: Pharmacist

## 2020-03-17 ENCOUNTER — Ambulatory Visit (INDEPENDENT_AMBULATORY_CARE_PROVIDER_SITE_OTHER): Payer: Medicare Other | Admitting: Nurse Practitioner

## 2020-03-17 ENCOUNTER — Encounter: Payer: Self-pay | Admitting: Nurse Practitioner

## 2020-03-17 ENCOUNTER — Other Ambulatory Visit: Payer: Self-pay

## 2020-03-17 VITALS — BP 117/70 | HR 65 | Temp 97.8°F | Wt 167.2 lb

## 2020-03-17 DIAGNOSIS — I771 Stricture of artery: Secondary | ICD-10-CM

## 2020-03-17 DIAGNOSIS — I152 Hypertension secondary to endocrine disorders: Secondary | ICD-10-CM

## 2020-03-17 DIAGNOSIS — Z23 Encounter for immunization: Secondary | ICD-10-CM | POA: Diagnosis not present

## 2020-03-17 DIAGNOSIS — Z79899 Other long term (current) drug therapy: Secondary | ICD-10-CM | POA: Diagnosis not present

## 2020-03-17 DIAGNOSIS — E1122 Type 2 diabetes mellitus with diabetic chronic kidney disease: Secondary | ICD-10-CM | POA: Diagnosis not present

## 2020-03-17 DIAGNOSIS — I10 Essential (primary) hypertension: Secondary | ICD-10-CM

## 2020-03-17 DIAGNOSIS — Z794 Long term (current) use of insulin: Secondary | ICD-10-CM

## 2020-03-17 DIAGNOSIS — S60419A Abrasion of unspecified finger, initial encounter: Secondary | ICD-10-CM | POA: Diagnosis not present

## 2020-03-17 DIAGNOSIS — E1165 Type 2 diabetes mellitus with hyperglycemia: Secondary | ICD-10-CM | POA: Diagnosis not present

## 2020-03-17 DIAGNOSIS — F339 Major depressive disorder, recurrent, unspecified: Secondary | ICD-10-CM

## 2020-03-17 DIAGNOSIS — E1159 Type 2 diabetes mellitus with other circulatory complications: Secondary | ICD-10-CM | POA: Diagnosis not present

## 2020-03-17 DIAGNOSIS — E785 Hyperlipidemia, unspecified: Secondary | ICD-10-CM

## 2020-03-17 DIAGNOSIS — N183 Chronic kidney disease, stage 3 unspecified: Secondary | ICD-10-CM | POA: Diagnosis not present

## 2020-03-17 DIAGNOSIS — E1169 Type 2 diabetes mellitus with other specified complication: Secondary | ICD-10-CM

## 2020-03-17 DIAGNOSIS — F5101 Primary insomnia: Secondary | ICD-10-CM

## 2020-03-17 DIAGNOSIS — I2510 Atherosclerotic heart disease of native coronary artery without angina pectoris: Secondary | ICD-10-CM

## 2020-03-17 LAB — MICROALBUMIN, URINE WAIVED
Creatinine, Urine Waived: 200 mg/dL (ref 10–300)
Microalb, Ur Waived: 30 mg/L — ABNORMAL HIGH (ref 0–19)
Microalb/Creat Ratio: <30 mg/g (ref <30)

## 2020-03-17 NOTE — Assessment & Plan Note (Signed)
Chronic, ongoing.  Continue current medication regimen and adjust as needed.  Lipid panel next visit.    

## 2020-03-17 NOTE — Assessment & Plan Note (Signed)
Chronic, ongoing for insomnia.  Has had trials of other medications and reductions with poor outcomeIs.  Fully aware of risks of chronic benzo use and aware this will be discussed each visit.  UDS today.

## 2020-03-17 NOTE — Assessment & Plan Note (Signed)
Stable, continue collaboration with cardiology and preventative treatment ASA, Plavix, and statin.

## 2020-03-17 NOTE — Assessment & Plan Note (Signed)
Chronic, ongoing.  Continue Losartan for kidney protection.  Urine micro in office ALB 30 and A:C <30. Continue collaboration with cardiology and endocrinology.  Consider nephrology referral if worsening function.  BMP today.

## 2020-03-17 NOTE — Assessment & Plan Note (Signed)
Chronic, stable with BP at goal in office and at home.  Continue current medication regimen and collaboration with cardiology. BMP today.

## 2020-03-17 NOTE — Chronic Care Management (AMB) (Signed)
Chronic Care Management   Follow Up Note   03/17/2020 Name: MILIANA GANGWER MRN: [id number] DOB: [birth year]  Referred by: Venita Lick, NP Reason for referral : Chronic Care Management (Medication Management)   DANIAL SISLEY is a 74 y.o. year old female who is a primary care patient of Cannady, Barbaraann Faster, NP. The CCM team was consulted for assistance with chronic disease management and care coordination needs.    Met with patient face to face at PCP visit.   Review of patient status, including review of consultants reports, relevant laboratory and other test results, and collaboration with appropriate care team members and the patient's provider was performed as part of comprehensive patient evaluation and provision of chronic care management services.    SDOH (Social Determinants of Health) assessments performed: No See Care Plan activities for detailed interventions related to [facility])     Outpatient Encounter Medications as of 03/17/2020  Medication Sig Note  . Insulin Glargine (BASAGLAR KWIKPEN) 100 UNIT/ML SOPN Inject 18 Units into the skin at bedtime.    Marland Kitchen amLODipine (NORVASC) 2.5 MG tablet TAKE 1 TABLET BY MOUTH EVERY DAY   . aspirin 81 MG tablet Take 81 mg by mouth daily.   . cholecalciferol (VITAMIN D3) 25 MCG (1000 UT) tablet Take 1,000 Units by mouth daily.   . clopidogrel (PLAVIX) 75 MG tablet TAKE 1 TABLET BY MOUTH EVERY DAY   . ferrous sulfate 325 (65 FE) MG tablet Take 325 mg by mouth daily with breakfast.   . folic acid (FOLVITE) 1 MG tablet Take 1 mg by mouth daily.   . hydrochlorothiazide (MICROZIDE) 12.5 MG capsule Take 1 capsule (12.5 mg total) by mouth daily.   Marland Kitchen LORazepam (ATIVAN) 0.5 MG tablet Take 1 tablet (0.5 mg total) by mouth 2 (two) times daily. Use the additional 30 tabs only sparingly and only for severe anxiety   . losartan (COZAAR) 100 MG tablet TAKE 1 TABLET BY MOUTH EVERY DAY   . Magnesium 400 MG TABS Take 1 tablet by mouth daily.   . metFORMIN  (GLUCOPHAGE) 500 MG tablet Take 500 mg by mouth 2 (two) times daily.    . methotrexate 50 MG/2ML injection Inject 15 mg into the skin once a week.   . Multiple Vitamin (MULTIVITAMIN) tablet Take 1 tablet by mouth daily.   Marland Kitchen NOVOLOG 100 UNIT/ML injection Inject 10 Units into the skin 3 (three) times daily. 03/17/2020: 20 units + sliding scale   . Omega-3 Fatty Acids (FISH OIL) 1200 MG CAPS Take 1,200 mg by mouth daily.   . pantoprazole (PROTONIX) 40 MG tablet TAKE 1 TABLET BY MOUTH EVERY DAY   . sertraline (ZOLOFT) 100 MG tablet Take 1.5 tablets (150 mg total) by mouth daily.   . simvastatin (ZOCOR) 40 MG tablet TAKE 1 TABLET (40 MG TOTAL) BY MOUTH DAILY AT 6 PM.   . vitamin B-12 (CYANOCOBALAMIN) 1000 MCG tablet Take 1,000 mcg by mouth daily.    No facility-administered encounter medications on file as of 03/17/2020.     Objective:   Goals Addressed            This Visit's Progress     Patient Stated   . PharmD "My sugars run high" (pt-stated)       CARE PLAN ENTRY (see longtitudinal plan of care for additional care plan information)  Current Barriers:  . Diabetes: uncontrolled; complicated by chronic medical conditions including HTN, CAD/PAD (hx cardiac stenting); most recent A1c 8.2%; follows w/ [place]  [facility] . Current antihyperglycemic regimen: metformin 500 mg BID, Lantus 22 units daily, Novolog 18 units base + sliding scale TID (2 extra units for every 50 mg/dL over 150);  o Notes that she has been using sliding scale for both her Lantus and Novolog (reports using Lantus 12 units last night) o Endocrinology stopped Trulicity 1.95 mg weekly after patient reported feeling fatigued with symptoms of diarrhea and 2-3 episodes of hypoglycemia overnight . Patient continues to report fear of hypoglycemia events overnight. Patient brought in [facility] reader to appointment today. Data was only available for the last few days. No hypoglycemic episodes were recorded. BG ranged from 80-200  but the majority of readings over last few days were in range. . Cardiovascular risk reduction: (followed by Dr. Orene Desanctis) o Current hypertensive regimen: amlodipine 2.5 mg daily, HCTZ 12.5 mg daily, losartan 100 mg daily o Current hyperlipidemia regimen: simvastatin 40 mg daily; last LDL at goal <70 o Current antiplatelet regimen: ASA 81 mg daily + clopidogrel 75 mg daily . Depression/anxiety: sertraline 143m daily  Pharmacist Clinical Goal(s):  .Marland KitchenOver the next 90 days, patient will work with PharmD and primary care provider to address optimized   Interventions: . Comprehensive medication review performed, medication list updated in electronic medical record . Inter-disciplinary care team collaboration (see longitudinal plan of care) . Reviewed difference between Lantus and Novolog and encouraged patient to always take her Lantus as prescribed as her episodes of hyper- and hypoglycemia are most likely due to her chasing her blood glucose with sliding scale insulin and not taking her basal insulin appropriately.  . Encouraged patient to set up provider access to her [employer] reader data. Patient will follow up with email to finish setting up provider access. . [facility]    Patient Self Care Activities:  . Patient will check blood glucose at least QID using FreeStyle Libre , document, and provide at future appointments . Patient will take medications as prescribed . Patient will report any questions or concerns to provider   Please see past updates related to this goal by clicking on the "Past Updates" button in the selected goal          Plan: - Scheduled f/u call 05/11/20.  Catie TDarnelle Maffucci PharmD, [phone]

## 2020-03-17 NOTE — Assessment & Plan Note (Signed)
Chronic, ongoing.  Denies SI/HI.  Continue Sertraline  150 MG daily as is ordered, which she agrees to do. This could benefit mood overall and help reduce benzo use.  Consider increase to 200 Mg in future for increased benefit.  Return in 3 months.

## 2020-03-17 NOTE — Patient Instructions (Signed)
Visit Information  Goals Addressed            This Visit's Progress     Patient Stated   . PharmD "My sugars run high" (pt-stated)       CARE PLAN ENTRY (see longtitudinal plan of care for additional care plan information)  Current Barriers:  . Diabetes: uncontrolled; complicated by chronic medical conditions including HTN, CAD/PAD (hx cardiac stenting); most recent A1c 8.2%; follows w/ [facility] . Current antihyperglycemic regimen: metformin 500 mg BID, Lantus 22 units daily, Novolog 18 units base + sliding scale TID (2 extra units for every 50 mg/dL over 150);  o Notes that she has been using sliding scale for both her Lantus and Novolog (reports using Lantus 12 units last night) o Endocrinology stopped Trulicity A999333 mg weekly after patient reported feeling fatigued with symptoms of diarrhea and 2-3 episodes of hypoglycemia overnight . Patient continues to report fear of hypoglycemia events overnight. Patient brought in [facility] reader to appointment today. Data was only available for the last few days. No hypoglycemic episodes were recorded. BG ranged from 80-200 but the majority of readings over last few days were in range. . Cardiovascular risk reduction: (followed by Dr. Orene Desanctis) o Current hypertensive regimen: amlodipine 2.5 mg daily, HCTZ 12.5 mg daily, losartan 100 mg daily o Current hyperlipidemia regimen: simvastatin 40 mg daily; last LDL at goal <70 o Current antiplatelet regimen: ASA 81 mg daily + clopidogrel 75 mg daily . Depression/anxiety: sertraline 150mg  daily  Pharmacist Clinical Goal(s):  Marland Kitchen Over the next 90 days, patient will work with PharmD and primary care provider to address optimized   Interventions: . Comprehensive medication review performed, medication list updated in electronic medical record . Inter-disciplinary care team collaboration (see longitudinal plan of care) . Reviewed difference between Lantus and Novolog and encouraged patient to  always take her Lantus as prescribed as her episodes of hyper- and hypoglycemia are most likely due to her chasing her blood glucose with sliding scale insulin and not taking her basal insulin appropriately.  . Encouraged patient to set up provider access to her [employer] reader data. Patient will follow up with email to finish setting up provider access. . Patient to follow up with [facility] endocrinology next month.    Patient Self Care Activities:  . Patient will check blood glucose at least QID using FreeStyle Libre , document, and provide at future appointments . Patient will take medications as prescribed . Patient will report any questions or concerns to provider   Please see past updates related to this goal by clicking on the "Past Updates" button in the selected goal         Patient verbalizes understanding of instructions provided today.   Plan: - Scheduled f/u call 05/11/20.  Catie Darnelle Maffucci, PharmD, [place] [phone]

## 2020-03-17 NOTE — Progress Notes (Signed)
BP 117/70 (BP Location: Left Arm, Patient Position: Sitting, Cuff Size: Normal)   Pulse 65   Temp 97.8 F (36.6 C) (Oral)   Wt 167 lb 3.2 oz (75.8 kg)   SpO2 98%   BMI 27.82 kg/m    Subjective:    Patient ID: Laura Rojas, female    DOB: 08/11/1946, 74 y.o.   MRN: [id number]  HPI: Laura Rojas is a 74 y.o. female  Chief Complaint  Patient presents with  . Diabetes  . Hypertension  . Hyperlipidemia  . Depression  . Anxiety   DIABETES Followed by endocrinology and last saw NP [facility] in February 2021 with A1C 8.2%.  She reports they discontinued Trulicity due to her having low BS.  She does endorse good/bad diet.  She wears a Libre and her total average glucose was 160 over past weeks.  Takes daily B12 supplement due to history of low levels.  She continues on [employer], and Metformin.  Reports she recently cut finger on some metal at her home, area is healing and denies any pain to area. Hypoglycemic episodes:no Polydipsia/polyuria: no Visual disturbance: no Chest pain: no Paresthesias: no Glucose Monitoring: yes             Accucheck frequency: Daily             Fasting glucose: 160 to 200             Post prandial:             Evening: 82 to 245             Before meals: Taking Insulin?: yes             Long acting insulin: 12 units, supposed to be 22 units             Short acting insulin:18 units with meals Blood Pressure Monitoring: rarely Retinal Examination: Up to Date Foot Exam: Up to Date Pneumovax: Up to Date Influenza: Up to Date Aspirin: yes   HYPERTENSION / HYPERLIPIDEMIA/CAD She is followed by cardiology, Dr. Sallyanne Kuster. Last saw 08/07/2019 with no changes.  Monitored by cardiology due to subclavian artery stenosis and angina. Continues on Amlodipine, Plavix, HCTZ, Fish oil, Simvastatin and Losartan. Satisfied with current treatment? yes Duration of hypertension: chronic BP monitoring frequency: not checking BP range:not checking BP  medication side effects: no Duration of hyperlipidemia: chronic Cholesterol medication side effects: no Cholesterol supplements: fish oil Medication compliance: good compliance Aspirin: yes Recent stressors: no Recurrent headaches: no Visual changes: no Palpitations: no Dyspnea: no Chest pain: no Lower extremity edema: no Dizzy/lightheaded: no   CHRONIC KIDNEY DISEASE CKD status: stable Medications renally dose: yes Previous renal evaluation: no Pneumovax:  Up to Date Influenza Vaccine:  Up to Date   ANXIETY/STRESS Taking Zoloft 150 MG daily.  Is taking Ativan 0.5 MG, takes this at night only, although ordered BID -- currently reports she takes 1 MG at night.  Occasionally will take extra dose at day time, but not often, reports since last visit has had to not take extra as often after out discussion.  Pt aware of risks of benzo medication use to include increased sedation, respiratory suppression, falls, dependence and cardiovascular events.  Pt would like to continue treatment as benefit determined to outweigh risk.  On review PMP last Ativan fill 01/29/2020 for 3 months supply. Duration:stable Anxious mood: yes  Excessive worrying: no Irritability: no  Sweating: no Nausea: no Palpitations:no Hyperventilation: no Panic attacks:  no Agoraphobia: no  Obscessions/compulsions: no Depressed mood: no Depression screen [facility] 2/9 03/17/2020 12/17/2019 10/14/2019 08/27/2019 01/08/2019  Decreased Interest 0 0 1 0 1  Down, Depressed, Hopeless 0 1 3 1 1   PHQ - 2 Score 0 1 4 1 2   Altered sleeping 0 3 3 - 2  Tired, decreased energy 0 1 3 - 2  Change in appetite 0 0 2 - 1  Feeling bad or failure about yourself  0 0 1 - 0  Trouble concentrating 0 0 0 - 0  Moving slowly or fidgety/restless 0 0 0 - 0  Suicidal thoughts 0 0 0 - 0  PHQ-9 Score 0 5 13 - 7  Difficult doing work/chores Not difficult at all - Somewhat difficult - -  Some recent data might be hidden  Anhedonia: no Weight  changes: no Insomnia: yes hard to fall asleep  Hypersomnia: no Fatigue/loss of energy: no Feelings of worthlessness: no Feelings of guilt: no Impaired concentration/indecisiveness: no Suicidal ideations: no  Crying spells: no Recent Stressors/Life Changes: no   Relationship problems: no   Family stress: no     Financial stress: no    Job stress: no    Recent death/loss: no GAD 7 : Generalized Anxiety Score 03/17/2020 10/14/2019 12/19/2017  Nervous, Anxious, on Edge 0 3 3  Control/stop worrying 0 3 2  Worry too much - different things 0 3 3  Trouble relaxing 0 3 3  Restless 0 0 3  Easily annoyed or irritable 0 3 3  Afraid - awful might happen 0 3 2  Total GAD 7 Score 0 18 19  Anxiety Difficulty Not difficult at all Somewhat difficult Very difficult    Relevant past medical, surgical, family and social history reviewed and updated as indicated. Interim medical history since our last visit reviewed. Allergies and medications reviewed and updated.  Review of Systems  Constitutional: Negative for activity change, appetite change, diaphoresis, fatigue and fever.  Respiratory: Negative for cough, chest tightness and shortness of breath.   Cardiovascular: Negative for chest pain, palpitations and leg swelling.  Gastrointestinal: Negative.   Endocrine: Negative for polydipsia, polyphagia and polyuria.  Neurological: Negative.   Psychiatric/Behavioral: Negative.     Per HPI unless specifically indicated above     Objective:    BP 117/70 (BP Location: Left Arm, Patient Position: Sitting, Cuff Size: Normal)   Pulse 65   Temp 97.8 F (36.6 C) (Oral)   Wt 167 lb 3.2 oz (75.8 kg)   SpO2 98%   BMI 27.82 kg/m   Wt Readings from Last 3 Encounters:  03/17/20 167 lb 3.2 oz (75.8 kg)  10/14/19 165 lb (74.8 kg)  08/27/19 165 lb (74.8 kg)    Physical Exam Vitals and nursing note reviewed.  Constitutional:      General: She is awake. She is not in acute distress.    Appearance:  She is well-developed and overweight. She is not ill-appearing.  HENT:     Head: Normocephalic.     Right Ear: Hearing normal.     Left Ear: Hearing normal.  Eyes:     General: Lids are normal.        Right eye: No discharge.        Left eye: No discharge.     Conjunctiva/sclera: Conjunctivae normal.     Pupils: Pupils are equal, round, and reactive to light.  Neck:     Thyroid: No thyromegaly.     Vascular: No carotid bruit.  Cardiovascular:  Rate and Rhythm: Normal rate and regular rhythm.     Heart sounds: Normal heart sounds. No murmur. No gallop.   Pulmonary:     Effort: Pulmonary effort is normal. No accessory muscle usage or respiratory distress.     Breath sounds: Normal breath sounds.  Abdominal:     General: Bowel sounds are normal.     Palpations: Abdomen is soft.  Musculoskeletal:     Cervical back: Normal range of motion and neck supple.     Right lower leg: No edema.     Left lower leg: No edema.  Skin:    General: Skin is warm and dry.     Comments: Small, healing abrasion to left index finger, dorsal aspect.  No erythema or warmth present.  Neurological:     Mental Status: She is alert and oriented to person, place, and time.  Psychiatric:        Attention and Perception: Attention normal.        Mood and Affect: Mood normal.        Speech: Speech normal.        Behavior: Behavior normal. Behavior is cooperative.        Thought Content: Thought content normal.     Results for orders placed or performed in visit on 03/09/20  Hemoglobin A1c  Result Value Ref Range   Hemoglobin A1C 8.2       Assessment & Plan:   Problem List Items Addressed This Visit      Cardiovascular and Mediastinum   Hypertension associated with diabetes ([place])    Chronic, stable with BP at goal in office and at home.  Continue current medication regimen and collaboration with cardiology. BMP today.        Subclavian artery stenosis, left (HCC)    Stable, continue  collaboration with cardiology and preventative treatment ASA, Plavix, and statin.        Endocrine   Hyperlipidemia associated with type 2 diabetes mellitus (HCC)    Chronic, ongoing.  Continue current medication regimen and adjust as needed.  Lipid panel next visit.      CKD stage 3 due to type 2 diabetes mellitus (HCC)    Chronic, ongoing.  Continue Losartan for kidney protection.  Urine micro in office ALB 30 and A:C <30. Continue collaboration with cardiology and endocrinology.  Consider nephrology referral if worsening function.  BMP today.      Relevant Orders   Microalbumin, Urine Waived   Basic metabolic panel   Type 2 diabetes mellitus with hyperglycemia, with long-term current use of insulin (HCC) - Primary    Chronic, ongoing, followed by endocrinology with recent A1C 8.2%.  Continue this collaboration and current medication regimen + collaboration with CCM team in office.  Patient is to start taking her Basaglar 22 units, as was recommended by endocrinology.  Monitor for low BS <70, especially due to age.  Would benefit from trial of SGLT in future (especially with cardiac health), will discuss with endocrinology.  Return to office in 3 months.        Other   Insomnia    Chronic, ongoing with use of Lorazepam nightly.  Has had trial reductions in past and other medications without success.  Is fully aware of risks of chronic benzo use and aware this will be discussed each visit.  Return in 3 months.  UDS today, obtain contract next visit.      Depression, recurrent (HCC)    Chronic, ongoing.  Denies  SI/HI.  Continue Sertraline  150 MG daily as is ordered, which she agrees to do. This could benefit mood overall and help reduce benzo use.  Consider increase to 200 Mg in future for increased benefit.  Return in 3 months.      Long-term current use of benzodiazepine    Chronic, ongoing for insomnia.  Has had trials of other medications and reductions with poor outcomeIs.  Fully  aware of risks of chronic benzo use and aware this will be discussed each visit.  UDS today.      Relevant Orders   [id number] 11+Oxyco+Alc+Crt-Bund    Other Visit Diagnoses    Finger abrasion, non-infected       On metal at home with healing present.  Will update patient Tetanus shot as is not up to date.   Relevant Orders   Td : Tetanus/diphtheria >7yo Preservative  free (Completed)       Follow up plan: Return in about 3 months (around 06/16/2020) for T2DM, HTN/HLD, MOOD.

## 2020-03-17 NOTE — Patient Instructions (Signed)

## 2020-03-17 NOTE — Assessment & Plan Note (Signed)
Chronic, ongoing, followed by endocrinology with recent A1C 8.2%.  Continue this collaboration and current medication regimen + collaboration with CCM team in office.  Patient is to start taking her Basaglar 22 units, as was recommended by endocrinology.  Monitor for low BS <70, especially due to age.  Would benefit from trial of SGLT in future (especially with cardiac health), will discuss with endocrinology.  Return to office in 3 months.

## 2020-03-17 NOTE — Assessment & Plan Note (Signed)
Chronic, ongoing with use of Lorazepam nightly.  Has had trial reductions in past and other medications without success.  Is fully aware of risks of chronic benzo use and aware this will be discussed each visit.  Return in 3 months.  UDS today, obtain contract next visit.

## 2020-03-18 LAB — BASIC METABOLIC PANEL
BUN/Creatinine Ratio: 20 (ref 12–28)
BUN: 18 mg/dL (ref 8–27)
CO2: 22 mmol/L (ref 20–29)
Calcium: 9.7 mg/dL (ref 8.7–10.3)
Chloride: 104 mmol/L (ref 96–106)
Creatinine, Ser: 0.91 mg/dL (ref 0.57–1.00)
GFR calc Af Amer: 72 mL/min/{1.73_m2} (ref >59)
GFR calc non Af Amer: 62 mL/min/{1.73_m2} (ref >59)
Glucose: 189 mg/dL — ABNORMAL HIGH (ref 65–99)
Potassium: 4.5 mmol/L (ref 3.5–5.2)
Sodium: 141 mmol/L (ref 134–144)

## 2020-03-18 NOTE — Progress Notes (Signed)
Contacted via MyChart Good morning Laura Rojas.  Your lab has returned and overall kidney function slightly improved on this check, looking at creatinine and GFR.  I would keep ensuring good control of diabetes and drinking plenty of water during daytime hours.  If any questions let me know. Keep being awesome!! Kindest regards, Haya Hemler

## 2020-03-19 LAB — DRUG SCREEN 764883 11+OXYCO+ALC+CRT-BUND

## 2020-03-31 DIAGNOSIS — E1159 Type 2 diabetes mellitus with other circulatory complications: Secondary | ICD-10-CM | POA: Diagnosis not present

## 2020-03-31 DIAGNOSIS — E785 Hyperlipidemia, unspecified: Secondary | ICD-10-CM | POA: Diagnosis not present

## 2020-03-31 DIAGNOSIS — E1165 Type 2 diabetes mellitus with hyperglycemia: Secondary | ICD-10-CM | POA: Diagnosis not present

## 2020-03-31 DIAGNOSIS — Z794 Long term (current) use of insulin: Secondary | ICD-10-CM | POA: Diagnosis not present

## 2020-03-31 DIAGNOSIS — E1169 Type 2 diabetes mellitus with other specified complication: Secondary | ICD-10-CM | POA: Diagnosis not present

## 2020-03-31 DIAGNOSIS — I10 Essential (primary) hypertension: Secondary | ICD-10-CM | POA: Diagnosis not present

## 2020-04-02 DIAGNOSIS — I10 Essential (primary) hypertension: Secondary | ICD-10-CM | POA: Diagnosis not present

## 2020-04-02 DIAGNOSIS — H2512 Age-related nuclear cataract, left eye: Secondary | ICD-10-CM | POA: Diagnosis not present

## 2020-04-05 ENCOUNTER — Encounter: Payer: Self-pay | Admitting: Ophthalmology

## 2020-04-05 ENCOUNTER — Other Ambulatory Visit: Payer: Self-pay

## 2020-04-07 ENCOUNTER — Other Ambulatory Visit: Payer: Self-pay

## 2020-04-07 DIAGNOSIS — I10 Essential (primary) hypertension: Secondary | ICD-10-CM

## 2020-04-07 MED ORDER — LOSARTAN POTASSIUM 100 MG PO TABS: 100.0000 mg | ORAL_TABLET | Freq: Every day | ORAL | 4 refills | Status: DC

## 2020-04-07 NOTE — Telephone Encounter (Signed)
Refill request for Losartan LOV: 03/17/20 Next Appt: 07/07/20

## 2020-04-09 ENCOUNTER — Other Ambulatory Visit: Payer: Self-pay

## 2020-04-09 ENCOUNTER — Other Ambulatory Visit
Admission: RE | Admit: 2020-04-09 | Discharge: 2020-04-09 | Disposition: A | Discharge status: Home / Self Care | Payer: Medicare Other | Source: Ambulatory Visit | Attending: Ophthalmology | Admitting: Ophthalmology

## 2020-04-09 DIAGNOSIS — Z20822 Contact with and (suspected) exposure to covid-19: Secondary | ICD-10-CM | POA: Diagnosis not present

## 2020-04-09 DIAGNOSIS — Z01812 Encounter for preprocedural laboratory examination: Secondary | ICD-10-CM | POA: Insufficient documentation

## 2020-04-10 LAB — SARS CORONAVIRUS 2 (TAT 6-24 HRS): SARS Coronavirus 2: NEGATIVE

## 2020-04-12 NOTE — Discharge Instructions (Signed)

## 2020-04-13 ENCOUNTER — Ambulatory Visit: Payer: Medicare Other | Admitting: Anesthesiology

## 2020-04-13 ENCOUNTER — Encounter: Payer: Self-pay | Admitting: Ophthalmology

## 2020-04-13 ENCOUNTER — Ambulatory Visit
Admission: RE | Admit: 2020-04-13 | Discharge: 2020-04-13 | Disposition: A | Discharge status: Home / Self Care | Payer: Medicare Other | Attending: Ophthalmology | Admitting: Ophthalmology

## 2020-04-13 ENCOUNTER — Encounter
Admission: RE | Disposition: A | Discharge status: Home / Self Care | Payer: Self-pay | Source: Home / Self Care | Attending: Ophthalmology

## 2020-04-13 ENCOUNTER — Other Ambulatory Visit: Payer: Self-pay

## 2020-04-13 DIAGNOSIS — F329 Major depressive disorder, single episode, unspecified: Secondary | ICD-10-CM | POA: Insufficient documentation

## 2020-04-13 DIAGNOSIS — Z955 Presence of coronary angioplasty implant and graft: Secondary | ICD-10-CM | POA: Insufficient documentation

## 2020-04-13 DIAGNOSIS — E7800 Pure hypercholesterolemia, unspecified: Secondary | ICD-10-CM | POA: Insufficient documentation

## 2020-04-13 DIAGNOSIS — Z882 Allergy status to sulfonamides status: Secondary | ICD-10-CM | POA: Diagnosis not present

## 2020-04-13 DIAGNOSIS — Z888 Allergy status to other drugs, medicaments and biological substances status: Secondary | ICD-10-CM | POA: Insufficient documentation

## 2020-04-13 DIAGNOSIS — Z79899 Other long term (current) drug therapy: Secondary | ICD-10-CM | POA: Diagnosis not present

## 2020-04-13 DIAGNOSIS — E1136 Type 2 diabetes mellitus with diabetic cataract: Secondary | ICD-10-CM | POA: Diagnosis not present

## 2020-04-13 DIAGNOSIS — K219 Gastro-esophageal reflux disease without esophagitis: Secondary | ICD-10-CM | POA: Insufficient documentation

## 2020-04-13 DIAGNOSIS — Z7982 Long term (current) use of aspirin: Secondary | ICD-10-CM | POA: Diagnosis not present

## 2020-04-13 DIAGNOSIS — Z91040 Latex allergy status: Secondary | ICD-10-CM | POA: Insufficient documentation

## 2020-04-13 DIAGNOSIS — Z96653 Presence of artificial knee joint, bilateral: Secondary | ICD-10-CM | POA: Diagnosis not present

## 2020-04-13 DIAGNOSIS — Z794 Long term (current) use of insulin: Secondary | ICD-10-CM | POA: Diagnosis not present

## 2020-04-13 DIAGNOSIS — M069 Rheumatoid arthritis, unspecified: Secondary | ICD-10-CM | POA: Insufficient documentation

## 2020-04-13 DIAGNOSIS — H2512 Age-related nuclear cataract, left eye: Secondary | ICD-10-CM | POA: Diagnosis not present

## 2020-04-13 DIAGNOSIS — Z7952 Long term (current) use of systemic steroids: Secondary | ICD-10-CM | POA: Diagnosis not present

## 2020-04-13 DIAGNOSIS — H25812 Combined forms of age-related cataract, left eye: Secondary | ICD-10-CM | POA: Diagnosis not present

## 2020-04-13 DIAGNOSIS — I10 Essential (primary) hypertension: Secondary | ICD-10-CM | POA: Diagnosis not present

## 2020-04-13 HISTORY — DX: Gastro-esophageal reflux disease without esophagitis: K21.9

## 2020-04-13 HISTORY — DX: Unspecified osteoarthritis, unspecified site: M19.90

## 2020-04-13 HISTORY — PX: CATARACT EXTRACTION W/PHACO: SHX586

## 2020-04-13 LAB — GLUCOSE, CAPILLARY
Glucose-Capillary: 190 mg/dL — ABNORMAL HIGH (ref 70–99)
Glucose-Capillary: 226 mg/dL — ABNORMAL HIGH (ref 70–99)

## 2020-04-13 SURGERY — PHACOEMULSIFICATION, CATARACT, WITH IOL INSERTION
Anesthesia: Monitor Anesthesia Care | Site: Eye | Laterality: Left

## 2020-04-13 MED ORDER — EPINEPHRINE PF 1 MG/ML IJ SOLN
INTRAOCULAR | Status: DC | PRN
  Administered 2020-04-13: 58 mL via OPHTHALMIC

## 2020-04-13 MED ORDER — ARMC OPHTHALMIC DILATING DROPS
1.0000 "application " | OPHTHALMIC | Status: DC | PRN
  Administered 2020-04-13 (×3): 1 via OPHTHALMIC

## 2020-04-13 MED ORDER — MIDAZOLAM HCL 2 MG/2ML IJ SOLN
INTRAMUSCULAR | Status: DC | PRN
  Administered 2020-04-13 (×2): 1 mg via INTRAVENOUS

## 2020-04-13 MED ORDER — TETRACAINE HCL 0.5 % OP SOLN
1.0000 [drp] | OPHTHALMIC | Status: DC | PRN
  Administered 2020-04-13 (×3): 1 [drp] via OPHTHALMIC

## 2020-04-13 MED ORDER — MOXIFLOXACIN HCL 0.5 % OP SOLN
OPHTHALMIC | Status: DC | PRN
  Administered 2020-04-13: 0.2 mL via OPHTHALMIC

## 2020-04-13 MED ORDER — LACTATED RINGERS IV SOLN: 100.0000 mL/h | INTRAVENOUS | Status: DC

## 2020-04-13 MED ORDER — BRIMONIDINE TARTRATE-TIMOLOL 0.2-0.5 % OP SOLN
OPHTHALMIC | Status: DC | PRN
  Administered 2020-04-13: 1 [drp] via OPHTHALMIC

## 2020-04-13 MED ORDER — FENTANYL CITRATE (PF) 100 MCG/2ML IJ SOLN
INTRAMUSCULAR | Status: DC | PRN
  Administered 2020-04-13 (×2): 50 ug via INTRAVENOUS

## 2020-04-13 MED ORDER — ONDANSETRON HCL 4 MG/2ML IJ SOLN: 4.0000 mg | Freq: Once | INTRAMUSCULAR | Status: DC | PRN

## 2020-04-13 MED ORDER — NA CHONDROIT SULF-NA HYALURON 40-17 MG/ML IO SOLN
INTRAOCULAR | Status: DC | PRN
  Administered 2020-04-13: 1 mL via INTRAOCULAR

## 2020-04-13 MED ORDER — LIDOCAINE HCL (PF) 2 % IJ SOLN
INTRAOCULAR | Status: DC | PRN
  Administered 2020-04-13: 1 mL

## 2020-04-13 MED ORDER — ACETAMINOPHEN 10 MG/ML IV SOLN: 1000.0000 mg | Freq: Once | INTRAVENOUS | Status: DC | PRN

## 2020-04-13 SURGICAL SUPPLY — 20 items
CANNULA ANT/CHMB 27G (MISCELLANEOUS) ×2 IMPLANT
CANNULA ANT/CHMB 27GA (MISCELLANEOUS) ×4 IMPLANT
GLOVE SURG LX 8.0 MICRO (GLOVE) ×1
GLOVE SURG LX STRL 8.0 MICRO (GLOVE) ×1 IMPLANT
GLOVE SURG TRIUMPH 8.0 PF LTX (GLOVE) ×2 IMPLANT
GOWN STRL REUS W/ TWL LRG LVL3 (GOWN DISPOSABLE) ×2 IMPLANT
GOWN STRL REUS W/TWL LRG LVL3 (GOWN DISPOSABLE) ×4
LENS IOL DIOP 19.0 (Intraocular Lens) ×2 IMPLANT
LENS IOL TECNIS MONO 19.0 (Intraocular Lens) IMPLANT
MARKER SKIN DUAL TIP RULER LAB (MISCELLANEOUS) ×2 IMPLANT
NDL FILTER BLUNT 18X1 1/2 (NEEDLE) ×1 IMPLANT
NEEDLE FILTER BLUNT 18X 1/2SAF (NEEDLE) ×1
NEEDLE FILTER BLUNT 18X1 1/2 (NEEDLE) ×1 IMPLANT
PACK EYE AFTER SURG (MISCELLANEOUS) ×2 IMPLANT
PACK OPTHALMIC (MISCELLANEOUS) ×2 IMPLANT
PACK PORFILIO (MISCELLANEOUS) ×2 IMPLANT
SYR 3ML LL SCALE MARK (SYRINGE) ×2 IMPLANT
SYR TB 1ML LUER SLIP (SYRINGE) ×2 IMPLANT
WATER STERILE IRR 250ML POUR (IV SOLUTION) ×2 IMPLANT
WIPE NON LINTING 3.25X3.25 (MISCELLANEOUS) ×2 IMPLANT

## 2020-04-13 NOTE — Transfer of Care (Signed)
Immediate Anesthesia Transfer of Care Note  Patient: Laura Rojas  Procedure(s) Performed: CATARACT EXTRACTION PHACO AND INTRAOCULAR LENS PLACEMENT (IOC) LEFT DIABETIC 4.69  00:44.0 (Left Eye)  Patient Location: PACU  Anesthesia Type: MAC  Level of Consciousness: awake, alert  and patient cooperative  Airway and Oxygen Therapy: Patient Spontanous Breathing   Post-op Assessment: Post-op Vital signs reviewed, Patient's Cardiovascular Status Stable, Respiratory Function Stable, Patent Airway and No signs of Nausea or vomiting  Post-op Vital Signs: Reviewed and stable  Complications: No apparent anesthesia complications

## 2020-04-13 NOTE — Op Note (Signed)
PREOPERATIVE DIAGNOSIS:  Nuclear sclerotic cataract of the left eye.   POSTOPERATIVE DIAGNOSIS:  Nuclear sclerotic cataract of the left eye.   OPERATIVE PROCEDURE:@   SURGEON:  Birder Robson, MD.   ANESTHESIA:  Anesthesiologist: Heniser, Fredric Dine, MD CRNA: Vanetta Shawl, CRNA  1.      Managed anesthesia care. 2.     0.59ml of Shugarcaine was instilled following the paracentesis   COMPLICATIONS:  None.   TECHNIQUE:   Stop and chop   DESCRIPTION OF PROCEDURE:  The patient was examined and consented in the preoperative holding area where the aforementioned topical anesthesia was applied to the left eye and then brought back to the Operating Room where the left eye was prepped and draped in the usual sterile ophthalmic fashion and a lid speculum was placed. A paracentesis was created with the side port blade and the anterior chamber was filled with viscoelastic. A near clear corneal incision was performed with the steel keratome. A continuous curvilinear capsulorrhexis was performed with a cystotome followed by the capsulorrhexis forceps. Hydrodissection and hydrodelineation were carried out with BSS on a blunt cannula. The lens was removed in a stop and chop  technique and the remaining cortical material was removed with the irrigation-aspiration handpiece. The capsular bag was inflated with viscoelastic and the Technis [id number] lens was placed in the capsular bag without complication. The remaining viscoelastic was removed from the eye with the irrigation-aspiration handpiece. The wounds were hydrated. The anterior chamber was flushed with BSS and the eye was inflated to physiologic pressure. 0.31ml Vigamox was placed in the anterior chamber. The wounds were found to be water tight. The eye was dressed with Combigan. The patient was given protective glasses to wear throughout the day and a shield with which to sleep tonight. The patient was also given drops with which to begin a drop regimen today  and will follow-up with me in one day. Implant Name Type Inv. Item Serial No. Manufacturer Lot No. LRB No. Used Action  LENS IOL DIOP 19.0 - [id number] Intraocular Lens LENS IOL DIOP 19.0 [id number]  Left 1 Implanted    Procedure(s) with comments: CATARACT EXTRACTION PHACO AND INTRAOCULAR LENS PLACEMENT (IOC) LEFT DIABETIC 4.69  00:44.0 (Left) - Diabetic - insulin and oral meds  Electronically signed: Birder Robson 04/13/2020 10:20 AM

## 2020-04-13 NOTE — H&P (Signed)
All labs reviewed. Abnormal studies sent to patients PCP when indicated.  Previous H&P reviewed, patient examined, there are NO CHANGES.  Kathe Wirick Porfilio5/18/20219:52 AM

## 2020-04-13 NOTE — Anesthesia Preprocedure Evaluation (Signed)
Anesthesia Evaluation  Patient identified by MRN, date of birth, ID band Patient awake    Reviewed: Allergy & Precautions, NPO status , Patient's Chart, lab work & pertinent test results, reviewed documented beta blocker date and time   History of Anesthesia Complications Negative for: history of anesthetic complications  Airway Mallampati: III  TM Distance: >3 FB Neck ROM: Limited    Dental   Pulmonary    breath sounds clear to auscultation       Cardiovascular hypertension, (-) angina+ CAD, + Cardiac Stents (2007 stent x2) and + Peripheral Vascular Disease (Subclavian artery stenosis)  (-) DOE  Rhythm:Regular Rate:Normal   HLD   Neuro/Psych PSYCHIATRIC DISORDERS Anxiety Depression    GI/Hepatic GERD  Controlled,  Endo/Other  diabetes  Renal/GU CRFRenal disease     Musculoskeletal  (+) Arthritis ,   Abdominal   Peds  Hematology  (+) anemia ,   Anesthesia Other Findings   Reproductive/Obstetrics                             Anesthesia Physical Anesthesia Plan  ASA: III  Anesthesia Plan: MAC   Post-op Pain Management:    Induction: Intravenous  PONV Risk Score and Plan: 2 and TIVA, Midazolam and Treatment may vary due to age or medical condition  Airway Management Planned: Nasal Cannula  Additional Equipment:   Intra-op Plan:   Post-operative Plan:   Informed Consent: I have reviewed the patients History and Physical, chart, labs and discussed the procedure including the risks, benefits and alternatives for the proposed anesthesia with the patient or authorized representative who has indicated his/her understanding and acceptance.       Plan Discussed with: CRNA and Anesthesiologist  Anesthesia Plan Comments:         Anesthesia Quick Evaluation

## 2020-04-13 NOTE — Anesthesia Postprocedure Evaluation (Signed)
Anesthesia Post Note  Patient: Laura Rojas  Procedure(s) Performed: CATARACT EXTRACTION PHACO AND INTRAOCULAR LENS PLACEMENT (IOC) LEFT DIABETIC 4.69  00:44.0 (Left Eye)     Patient location during evaluation: PACU Anesthesia Type: MAC Level of consciousness: awake and alert Pain management: pain level controlled Vital Signs Assessment: post-procedure vital signs reviewed and stable Respiratory status: spontaneous breathing, nonlabored ventilation, respiratory function stable and patient connected to nasal cannula oxygen Cardiovascular status: stable and blood pressure returned to baseline Postop Assessment: no apparent nausea or vomiting Anesthetic complications: no    Sutton Hirsch A  Jennetta Flood

## 2020-04-13 NOTE — Anesthesia Procedure Notes (Signed)
Procedure Name: MAC Date/Time: 04/13/2020 10:02 AM Performed by: Vanetta Shawl, CRNA Pre-anesthesia Checklist: Patient identified, Emergency Drugs available, Suction available, Timeout performed and Patient being monitored Patient Re-evaluated:Patient Re-evaluated prior to induction Oxygen Delivery Method: Nasal cannula Placement Confirmation: positive ETCO2

## 2020-04-14 ENCOUNTER — Encounter: Payer: Self-pay | Admitting: *Deleted

## 2020-04-19 DIAGNOSIS — M069 Rheumatoid arthritis, unspecified: Secondary | ICD-10-CM | POA: Diagnosis not present

## 2020-04-19 DIAGNOSIS — H2511 Age-related nuclear cataract, right eye: Secondary | ICD-10-CM | POA: Diagnosis not present

## 2020-04-21 DIAGNOSIS — Z79899 Other long term (current) drug therapy: Secondary | ICD-10-CM | POA: Diagnosis not present

## 2020-04-21 DIAGNOSIS — M0609 Rheumatoid arthritis without rheumatoid factor, multiple sites: Secondary | ICD-10-CM | POA: Diagnosis not present

## 2020-04-21 DIAGNOSIS — R768 Other specified abnormal immunological findings in serum: Secondary | ICD-10-CM | POA: Diagnosis not present

## 2020-04-21 DIAGNOSIS — Z96653 Presence of artificial knee joint, bilateral: Secondary | ICD-10-CM | POA: Diagnosis not present

## 2020-04-27 ENCOUNTER — Other Ambulatory Visit: Payer: Self-pay

## 2020-04-27 ENCOUNTER — Encounter: Payer: Self-pay | Admitting: Ophthalmology

## 2020-04-28 ENCOUNTER — Other Ambulatory Visit: Payer: Self-pay | Admitting: Nurse Practitioner

## 2020-04-28 NOTE — Telephone Encounter (Signed)
Called and left detailed VM (DPR reviewed) letting pt know that med has been refilled but she will be able to pick up on 04/30/20

## 2020-04-28 NOTE — Telephone Encounter (Signed)
Requested medication (s) are due for refill today: yes  Requested medication (s) are on the active medication list: yes  Last refill:  01/29/2020  Future visit scheduled: yes  Notes to clinic:  this refill cannot be delegated    Requested Prescriptions  Pending Prescriptions Disp Refills   LORazepam (ATIVAN) 0.5 MG tablet [Pharmacy Med Name: LORAZEPAM 0.5 MG TABLET] 180 tablet 0    Sig: TAKE 1 TAB BY MOUTH TWICE A DAY (USE THE ADDITIONAL 30 TABS ONLY SPARINGLY & ONLY FOR SEVERE ANIETY)      Not Delegated - Psychiatry:  Anxiolytics/Hypnotics Failed - 04/28/2020 10:05 AM      Failed - This refill cannot be delegated      Passed - Urine Drug Screen completed in last 360 days.      Passed - Valid encounter within last 6 months    Recent Outpatient Visits           1 month ago Type 2 diabetes mellitus with hyperglycemia, with long-term current use of insulin ([place])   [place], [place] T, NP   4 months ago Hypertension associated with diabetes ([place])   [place], Jolene T, NP   5 months ago Close exposure to COVID-19 virus   [employer], [place] T, NP   6 months ago Polymyalgia rheumatica ([place])   Mustang, Jolene T, NP   9 months ago Coronary artery disease of bypass graft of native heart with stable angina pectoris [facility])   [place], Jeannette How, MD       Future Appointments             In 2 months Cannady, Barbaraann Faster, NP [employer]

## 2020-04-28 NOTE — Telephone Encounter (Signed)
Last fill on PMP review 01/29/20.  Will set this fill for 04/30/20.

## 2020-04-28 NOTE — Telephone Encounter (Signed)
Routing to patient

## 2020-04-30 ENCOUNTER — Other Ambulatory Visit: Payer: Self-pay

## 2020-04-30 ENCOUNTER — Other Ambulatory Visit
Admission: RE | Admit: 2020-04-30 | Discharge: 2020-04-30 | Disposition: A | Discharge status: Home / Self Care | Payer: Medicare Other | Source: Ambulatory Visit | Attending: Ophthalmology | Admitting: Ophthalmology

## 2020-04-30 DIAGNOSIS — Z01812 Encounter for preprocedural laboratory examination: Secondary | ICD-10-CM | POA: Diagnosis not present

## 2020-04-30 DIAGNOSIS — Z20822 Contact with and (suspected) exposure to covid-19: Secondary | ICD-10-CM | POA: Diagnosis not present

## 2020-04-30 LAB — SARS CORONAVIRUS 2 (TAT 6-24 HRS): SARS Coronavirus 2: NEGATIVE

## 2020-04-30 NOTE — Discharge Instructions (Signed)

## 2020-05-04 ENCOUNTER — Encounter
Admission: RE | Disposition: A | Discharge status: Home / Self Care | Payer: Self-pay | Source: Home / Self Care | Attending: Ophthalmology

## 2020-05-04 ENCOUNTER — Ambulatory Visit
Admission: RE | Admit: 2020-05-04 | Discharge: 2020-05-04 | Disposition: A | Discharge status: Home / Self Care | Payer: Medicare Other | Attending: Ophthalmology | Admitting: Ophthalmology

## 2020-05-04 ENCOUNTER — Ambulatory Visit: Payer: Medicare Other | Admitting: Anesthesiology

## 2020-05-04 ENCOUNTER — Other Ambulatory Visit: Payer: Self-pay

## 2020-05-04 ENCOUNTER — Encounter: Payer: Self-pay | Admitting: Ophthalmology

## 2020-05-04 DIAGNOSIS — F329 Major depressive disorder, single episode, unspecified: Secondary | ICD-10-CM | POA: Diagnosis not present

## 2020-05-04 DIAGNOSIS — E1136 Type 2 diabetes mellitus with diabetic cataract: Secondary | ICD-10-CM | POA: Diagnosis not present

## 2020-05-04 DIAGNOSIS — M109 Gout, unspecified: Secondary | ICD-10-CM | POA: Diagnosis not present

## 2020-05-04 DIAGNOSIS — Z7902 Long term (current) use of antithrombotics/antiplatelets: Secondary | ICD-10-CM | POA: Insufficient documentation

## 2020-05-04 DIAGNOSIS — Z888 Allergy status to other drugs, medicaments and biological substances status: Secondary | ICD-10-CM | POA: Insufficient documentation

## 2020-05-04 DIAGNOSIS — Z91040 Latex allergy status: Secondary | ICD-10-CM | POA: Insufficient documentation

## 2020-05-04 DIAGNOSIS — H2511 Age-related nuclear cataract, right eye: Secondary | ICD-10-CM | POA: Insufficient documentation

## 2020-05-04 DIAGNOSIS — M069 Rheumatoid arthritis, unspecified: Secondary | ICD-10-CM | POA: Diagnosis not present

## 2020-05-04 DIAGNOSIS — Z9849 Cataract extraction status, unspecified eye: Secondary | ICD-10-CM | POA: Insufficient documentation

## 2020-05-04 DIAGNOSIS — Z882 Allergy status to sulfonamides status: Secondary | ICD-10-CM | POA: Diagnosis not present

## 2020-05-04 DIAGNOSIS — I10 Essential (primary) hypertension: Secondary | ICD-10-CM | POA: Insufficient documentation

## 2020-05-04 DIAGNOSIS — Z955 Presence of coronary angioplasty implant and graft: Secondary | ICD-10-CM | POA: Diagnosis not present

## 2020-05-04 DIAGNOSIS — E7800 Pure hypercholesterolemia, unspecified: Secondary | ICD-10-CM | POA: Insufficient documentation

## 2020-05-04 DIAGNOSIS — H25811 Combined forms of age-related cataract, right eye: Secondary | ICD-10-CM | POA: Diagnosis not present

## 2020-05-04 DIAGNOSIS — K219 Gastro-esophageal reflux disease without esophagitis: Secondary | ICD-10-CM | POA: Diagnosis not present

## 2020-05-04 DIAGNOSIS — Z96653 Presence of artificial knee joint, bilateral: Secondary | ICD-10-CM | POA: Insufficient documentation

## 2020-05-04 DIAGNOSIS — Z794 Long term (current) use of insulin: Secondary | ICD-10-CM | POA: Insufficient documentation

## 2020-05-04 HISTORY — PX: CATARACT EXTRACTION W/PHACO: SHX586

## 2020-05-04 LAB — GLUCOSE, CAPILLARY
Glucose-Capillary: 222 mg/dL — ABNORMAL HIGH (ref 70–99)
Glucose-Capillary: 237 mg/dL — ABNORMAL HIGH (ref 70–99)

## 2020-05-04 SURGERY — PHACOEMULSIFICATION, CATARACT, WITH IOL INSERTION
Anesthesia: Monitor Anesthesia Care | Site: Eye | Laterality: Right

## 2020-05-04 MED ORDER — ARMC OPHTHALMIC DILATING DROPS
1.0000 "application " | OPHTHALMIC | Status: DC | PRN
  Administered 2020-05-04 (×3): 1 via OPHTHALMIC

## 2020-05-04 MED ORDER — LIDOCAINE HCL (PF) 2 % IJ SOLN
INTRAOCULAR | Status: DC | PRN
  Administered 2020-05-04: 1 mL

## 2020-05-04 MED ORDER — MOXIFLOXACIN HCL 0.5 % OP SOLN
OPHTHALMIC | Status: DC | PRN
  Administered 2020-05-04: 0.2 mL via OPHTHALMIC

## 2020-05-04 MED ORDER — BRIMONIDINE TARTRATE-TIMOLOL 0.2-0.5 % OP SOLN
OPHTHALMIC | Status: DC | PRN
  Administered 2020-05-04: 1 [drp] via OPHTHALMIC

## 2020-05-04 MED ORDER — FENTANYL CITRATE (PF) 100 MCG/2ML IJ SOLN
INTRAMUSCULAR | Status: DC | PRN
  Administered 2020-05-04 (×2): 50 ug via INTRAVENOUS

## 2020-05-04 MED ORDER — MIDAZOLAM HCL 2 MG/2ML IJ SOLN
INTRAMUSCULAR | Status: DC | PRN
  Administered 2020-05-04: 2 mg via INTRAVENOUS

## 2020-05-04 MED ORDER — NA CHONDROIT SULF-NA HYALURON 40-17 MG/ML IO SOLN
INTRAOCULAR | Status: DC | PRN
  Administered 2020-05-04: 1 mL via INTRAOCULAR

## 2020-05-04 MED ORDER — TETRACAINE HCL 0.5 % OP SOLN
1.0000 [drp] | OPHTHALMIC | Status: DC | PRN
  Administered 2020-05-04 (×3): 1 [drp] via OPHTHALMIC

## 2020-05-04 MED ORDER — EPINEPHRINE PF 1 MG/ML IJ SOLN
INTRAOCULAR | Status: DC | PRN
  Administered 2020-05-04: 50 mL via OPHTHALMIC

## 2020-05-04 SURGICAL SUPPLY — 20 items
CANNULA ANT/CHMB 27G (MISCELLANEOUS) ×2 IMPLANT
CANNULA ANT/CHMB 27GA (MISCELLANEOUS) ×6 IMPLANT
GLOVE SURG LX 8.0 MICRO (GLOVE) ×2
GLOVE SURG LX STRL 8.0 MICRO (GLOVE) ×1 IMPLANT
GLOVE SURG TRIUMPH 8.0 PF LTX (GLOVE) ×3 IMPLANT
GOWN STRL REUS W/ TWL LRG LVL3 (GOWN DISPOSABLE) ×2 IMPLANT
GOWN STRL REUS W/TWL LRG LVL3 (GOWN DISPOSABLE) ×6
LENS IOL DIOP 18.0 (Intraocular Lens) ×3 IMPLANT
LENS IOL TECNIS MONO 18.0 (Intraocular Lens) IMPLANT
MARKER SKIN DUAL TIP RULER LAB (MISCELLANEOUS) ×3 IMPLANT
NDL FILTER BLUNT 18X1 1/2 (NEEDLE) ×1 IMPLANT
NEEDLE FILTER BLUNT 18X 1/2SAF (NEEDLE) ×2
NEEDLE FILTER BLUNT 18X1 1/2 (NEEDLE) ×1 IMPLANT
PACK EYE AFTER SURG (MISCELLANEOUS) ×3 IMPLANT
PACK OPTHALMIC (MISCELLANEOUS) ×3 IMPLANT
PACK PORFILIO (MISCELLANEOUS) ×3 IMPLANT
SYR 3ML LL SCALE MARK (SYRINGE) ×3 IMPLANT
SYR TB 1ML LUER SLIP (SYRINGE) ×3 IMPLANT
WATER STERILE IRR 250ML POUR (IV SOLUTION) ×3 IMPLANT
WIPE NON LINTING 3.25X3.25 (MISCELLANEOUS) ×3 IMPLANT

## 2020-05-04 NOTE — Anesthesia Preprocedure Evaluation (Signed)
Anesthesia Evaluation  Patient identified by MRN, date of birth, ID band  History of Anesthesia Complications Negative for: history of anesthetic complications  Airway Mallampati: III  TM Distance: >3 FB Neck ROM: Limited    Dental no notable dental hx.    Pulmonary neg pulmonary ROS,    Pulmonary exam normal        Cardiovascular hypertension, Pt. on medications + Cardiac Stents (stents x2 >5 years ago, on daily Plavix)  Normal cardiovascular exam     Neuro/Psych negative neurological ROS     GI/Hepatic GERD  Medicated,  Endo/Other  diabetes, Type 2, Insulin Dependent  Renal/GU CRFRenal disease (2/2 DM)     Musculoskeletal  (+) Arthritis , Rheumatoid disorders,    Abdominal   Peds  Hematology   Anesthesia Other Findings   Reproductive/Obstetrics                           Anesthesia Physical Anesthesia Plan  ASA: III  Anesthesia Plan: MAC   Post-op Pain Management:    Induction: Intravenous  PONV Risk Score and Plan: 2 and Midazolam, TIVA and Treatment may vary due to age or medical condition  Airway Management Planned: Nasal Cannula and Natural Airway  Additional Equipment: None  Intra-op Plan:   Post-operative Plan:   Informed Consent: I have reviewed the patients History and Physical, chart, labs and discussed the procedure including the risks, benefits and alternatives for the proposed anesthesia with the patient or authorized representative who has indicated his/her understanding and acceptance.       Plan Discussed with: CRNA  Anesthesia Plan Comments:         Anesthesia Quick Evaluation

## 2020-05-04 NOTE — Anesthesia Procedure Notes (Signed)
Procedure Name: MAC Date/Time: 05/04/2020 10:47 AM Performed by: Jeannene Patella, CRNA Pre-anesthesia Checklist: Patient identified, Emergency Drugs available, Suction available, Patient being monitored and Timeout performed Patient Re-evaluated:Patient Re-evaluated prior to induction Oxygen Delivery Method: Nasal cannula

## 2020-05-04 NOTE — Transfer of Care (Signed)
Immediate Anesthesia Transfer of Care Note  Patient: Laura Rojas  Procedure(s) Performed: CATARACT EXTRACTION PHACO AND INTRAOCULAR LENS PLACEMENT (IOC) RIGHT DIABETIC 3.92  00:27.1 (Right Eye)  Patient Location: PACU  Anesthesia Type: MAC  Level of Consciousness: awake, alert  and patient cooperative  Airway and Oxygen Therapy: Patient Spontanous Breathing and Patient connected to supplemental oxygen  Post-op Assessment: Post-op Vital signs reviewed, Patient's Cardiovascular Status Stable, Respiratory Function Stable, Patent Airway and No signs of Nausea or vomiting  Post-op Vital Signs: Reviewed and stable  Complications: No apparent anesthesia complications

## 2020-05-04 NOTE — Op Note (Signed)
PREOPERATIVE DIAGNOSIS:  Nuclear sclerotic cataract of the right eye.   POSTOPERATIVE DIAGNOSIS:  H25.11 Cataract   OPERATIVE PROCEDURE:@   SURGEON:  Birder Robson, MD.   ANESTHESIA:  Anesthesiologist: Page, Adele Barthel, MD CRNA: Jeannene Patella, CRNA  1.      Managed anesthesia care. 2.      0.36ml of Shugarcaine was instilled in the eye following the paracentesis.   COMPLICATIONS:  None.   TECHNIQUE:   Stop and chop   DESCRIPTION OF PROCEDURE:  The patient was examined and consented in the preoperative holding area where the aforementioned topical anesthesia was applied to the right eye and then brought back to the Operating Room where the right eye was prepped and draped in the usual sterile ophthalmic fashion and a lid speculum was placed. A paracentesis was created with the side port blade and the anterior chamber was filled with viscoelastic. A near clear corneal incision was performed with the steel keratome. A continuous curvilinear capsulorrhexis was performed with a cystotome followed by the capsulorrhexis forceps. Hydrodissection and hydrodelineation were carried out with BSS on a blunt cannula. The lens was removed in a stop and chop  technique and the remaining cortical material was removed with the irrigation-aspiration handpiece. The capsular bag was inflated with viscoelastic and the Technis [id number]  lens was placed in the capsular bag without complication. The remaining viscoelastic was removed from the eye with the irrigation-aspiration handpiece. The wounds were hydrated. The anterior chamber was flushed with BSS and the eye was inflated to physiologic pressure. 0.23ml of Vigamox was placed in the anterior chamber. The wounds were found to be water tight. The eye was dressed with Combigan. The patient was given protective glasses to wear throughout the day and a shield with which to sleep tonight. The patient was also given drops with which to begin a drop regimen today and  will follow-up with me in one day. Implant Name Type Inv. Item Serial No. Manufacturer Lot No. LRB No. Used Action  LENS IOL DIOP 18.0 - [id number] Intraocular Lens LENS IOL DIOP 18.0 [id number] AMO  Right 1 Implanted   Procedure(s) with comments: CATARACT EXTRACTION PHACO AND INTRAOCULAR LENS PLACEMENT (IOC) RIGHT DIABETIC 3.92  00:27.1 (Right) - Diabetic - insulin and oral meds  Electronically signed: Birder Robson 05/04/2020 11:07 AM

## 2020-05-04 NOTE — H&P (Signed)
All labs reviewed. Abnormal studies sent to patients PCP when indicated.  Previous H&P reviewed, patient examined, there are NO CHANGES.  Laura Mehlberg Porfilio6/8/202110:41 AM

## 2020-05-04 NOTE — Anesthesia Postprocedure Evaluation (Signed)
Anesthesia Post Note  Patient: Laura Rojas  Procedure(s) Performed: CATARACT EXTRACTION PHACO AND INTRAOCULAR LENS PLACEMENT (IOC) RIGHT DIABETIC 3.92  00:27.1 (Right Eye)     Patient location during evaluation: PACU Anesthesia Type: MAC Level of consciousness: awake and alert Pain management: pain level controlled Vital Signs Assessment: post-procedure vital signs reviewed and stable Respiratory status: spontaneous breathing Cardiovascular status: blood pressure returned to baseline Postop Assessment: no apparent nausea or vomiting, adequate PO intake and no headache Anesthetic complications: no    Adele Barthel Saed Hudlow

## 2020-05-05 ENCOUNTER — Encounter: Payer: Self-pay | Admitting: *Deleted

## 2020-05-11 ENCOUNTER — Ambulatory Visit: Payer: Self-pay | Admitting: Pharmacist

## 2020-05-11 ENCOUNTER — Other Ambulatory Visit: Payer: Self-pay | Admitting: Nurse Practitioner

## 2020-05-11 ENCOUNTER — Ambulatory Visit (INDEPENDENT_AMBULATORY_CARE_PROVIDER_SITE_OTHER): Payer: Medicare Other | Admitting: Pharmacist

## 2020-05-11 DIAGNOSIS — N183 Chronic kidney disease, stage 3 unspecified: Secondary | ICD-10-CM

## 2020-05-11 DIAGNOSIS — Z794 Long term (current) use of insulin: Secondary | ICD-10-CM | POA: Diagnosis not present

## 2020-05-11 DIAGNOSIS — E1165 Type 2 diabetes mellitus with hyperglycemia: Secondary | ICD-10-CM

## 2020-05-11 DIAGNOSIS — E1122 Type 2 diabetes mellitus with diabetic chronic kidney disease: Secondary | ICD-10-CM | POA: Diagnosis not present

## 2020-05-11 DIAGNOSIS — F339 Major depressive disorder, recurrent, unspecified: Secondary | ICD-10-CM | POA: Diagnosis not present

## 2020-05-11 MED ORDER — SERTRALINE HCL 100 MG PO TABS: 150.0000 mg | ORAL_TABLET | Freq: Every day | ORAL | 1 refills | Status: DC

## 2020-05-11 NOTE — Chronic Care Management (AMB) (Signed)
Chronic Care Management   Follow Up Note   05/11/2020 Name: Laura Rojas MRN: [id number] DOB: 12/05/1945  Referred by: Venita Lick, NP Reason for referral : Chronic Care Management (Medication Management)   Laura Rojas is a 74 y.o. year old female who is a primary care patient of Cannady, Barbaraann Faster, NP. The CCM team was consulted for assistance with chronic disease management and care coordination needs.    Contacted patient for medication management review.   Review of patient status, including review of consultants reports, relevant laboratory and other test results, and collaboration with appropriate care team members and the patient's provider was performed as part of comprehensive patient evaluation and provision of chronic care management services.    SDOH (Social Determinants of Health) assessments performed: No See Care Plan activities for detailed interventions related to [place] [facility])     Outpatient Encounter Medications as of 05/11/2020  Medication Sig Note  . amLODipine (NORVASC) 2.5 MG tablet TAKE 1 TABLET BY MOUTH EVERY DAY   . aspirin 81 MG tablet Take 81 mg by mouth daily.   . cholecalciferol (VITAMIN D3) 25 MCG (1000 UT) tablet Take 1,000 Units by mouth daily.   . clopidogrel (PLAVIX) 75 MG tablet TAKE 1 TABLET BY MOUTH EVERY DAY   . folic acid (FOLVITE) 1 MG tablet Take 1 mg by mouth daily.   . hydrochlorothiazide (MICROZIDE) 12.5 MG capsule Take 1 capsule (12.5 mg total) by mouth daily.   . Insulin Glargine (BASAGLAR KWIKPEN) 100 UNIT/ML SOPN Inject 20 Units into the skin at bedtime.    Marland Kitchen LORazepam (ATIVAN) 0.5 MG tablet TAKE 1 TAB BY MOUTH TWICE A DAY (USE THE ADDITIONAL 30 TABS ONLY SPARINGLY & ONLY FOR SEVERE ANIETY) 05/11/2020: Takes 2 tablets QPM for sleep  . losartan (COZAAR) 100 MG tablet Take 1 tablet (100 mg total) by mouth daily.   . Magnesium 400 MG TABS Take 1 tablet by mouth daily.   . metFORMIN (GLUCOPHAGE) 500 MG tablet Take 500 mg by mouth 2 (two)  times daily.    . methotrexate 50 MG/2ML injection Inject 25 mg into the skin once a week.    . Multiple Vitamin (MULTIVITAMIN) tablet Take 1 tablet by mouth daily.   Marland Kitchen NOVOLOG 100 UNIT/ML injection Inject 10 Units into the skin 3 (three) times daily. 05/11/2020: 18 units + sliding scale   . Omega-3 Fatty Acids (FISH OIL) 1200 MG CAPS Take 1,200 mg by mouth daily.   . pantoprazole (PROTONIX) 40 MG tablet TAKE 1 TABLET BY MOUTH EVERY DAY   . sertraline (ZOLOFT) 100 MG tablet Take 1.5 tablets (150 mg total) by mouth daily.   . simvastatin (ZOCOR) 40 MG tablet TAKE 1 TABLET (40 MG TOTAL) BY MOUTH DAILY AT 6 PM.   . vitamin B-12 (CYANOCOBALAMIN) 1000 MCG tablet Take 1,000 mcg by mouth daily.    No facility-administered encounter medications on file as of 05/11/2020.     Objective:   Goals Addressed              This Visit's Progress     Patient Stated   .  PharmD "My sugars run high" (pt-stated)        CARE PLAN ENTRY (see longtitudinal plan of care for additional care plan information)  Current Barriers:  . Diabetes: uncontrolled; complicated by chronic medical conditions including HTN, CAD/PAD (hx cardiac stenting); most recent A1c 7.8%; follows w/ [facility] . Reports she had a great birthday weekend. She had time  with her family, and she and her husband went up to the [facility] for the night on Sunday. Notes she didn't follow the most diabetes-appropriate diet, but enjoyed her time . Current antihyperglycemic regimen: metformin 500 mg BID, Basaglar 20 units daily- but only using 14 units, Novolog 18 units base + sliding scale TID (2 extra units for every 50 mg/dL over 150) -> 22 units usually  . Denies any hypoglycemia since she self-reduced Basaglar to 14 units . Current glucose readings:  o Fasting readings: 200s o 2 hours after meals: usually <180 Average Glucose: 167 - last 14 days Time in Goal:  - Time in range (range is set at 100-150): 21% - Time above range: 73% -  Time below range: 6% . Cardiovascular risk reduction: (followed by Dr. Orene Desanctis) o Current hypertensive regimen: amlodipine 2.5 mg daily, HCTZ 12.5 mg daily, losartan 100 mg daily o Current hyperlipidemia regimen: simvastatin 40 mg daily; last LDL at goal <70 o Current antiplatelet regimen: ASA 81 mg daily + clopidogrel 75 mg daily . Depression/anxiety: sertraline 150mg  daily, lorazepam 0.5 mg, taking 2 pm for sleep. Requests a refill on sertraline today before she goes out of town this weekend. Reports improvement in daytime anxiety w/ increased dose of sertraline . PMR: [facility]. MTX 25 mg weekly, folic acid 1 mg daily. Follows w/ Dr. Meda Rojas  Pharmacist Clinical Goal(s):  Marland Kitchen Over the next 90 days, patient will work with PharmD and primary care provider to address optimized   Interventions: . Comprehensive medication review performed, medication list updated in electronic medical record . Inter-disciplinary care team collaboration (see longitudinal plan of care) . Walked through [employer] CGM target goal to 80-180 for better interpretation of reading . Reviewed that she is not taking Basaglar as directed d/t worries about hypoglycemia after supper/early evening. Reviewed that we do not want fastings >200 as they are now. Encouraged to start by increasing Basaglar to 16 units, but decrease supper Novolog to ~18-20 units, instead of 22. She verbalized understanding.  . Encouraged continued collaboration and calls to me with questions, rather than self-adjusting insulin.   Patient Self Care Activities:  . Patient will check blood glucose at least QID using FreeStyle Libre , document, and provide at future appointments . Patient will take medications as prescribed . Patient will report any questions or concerns to provider   Please see past updates related to this goal by clicking on the "Past Updates" button in the selected goal          Plan:  - Scheduled f/u call in ~ 12  weeks  Catie Darnelle Maffucci, PharmD, [place] [phone]

## 2020-05-11 NOTE — Patient Instructions (Signed)
Visit Information  Goals Addressed              This Visit's Progress     Patient Stated   .  PharmD "My sugars run high" (pt-stated)        CARE PLAN ENTRY (see longtitudinal plan of care for additional care plan information)  Current Barriers:  . Diabetes: uncontrolled; complicated by chronic medical conditions including HTN, CAD/PAD (hx cardiac stenting); most recent A1c 7.8%; follows w/ [facility] . Reports she had a great birthday weekend. She had time with her family, and she and her husband went up to the [facility] [place] the night on Sunday. Notes she didn't follow the most diabetes-appropriate diet, but enjoyed her time . Current antihyperglycemic regimen: metformin 500 mg BID, Basaglar 20 units daily- but only using 14 units, Novolog 18 units base + sliding scale TID (2 extra units for every 50 mg/dL over 150) -> 22 units usually  . Denies any hypoglycemia since she self-reduced Basaglar to 14 units . Current glucose readings:  o Fasting readings: 200s o 2 hours after meals: usually <180 Average Glucose: 167 - last 14 days Time in Goal:  - Time in range (range is set at 100-150): 21% - Time above range: 73% - Time below range: 6% . Cardiovascular risk reduction: (followed by Dr. Orene Desanctis) o Current hypertensive regimen: amlodipine 2.5 mg daily, HCTZ 12.5 mg daily, losartan 100 mg daily o Current hyperlipidemia regimen: simvastatin 40 mg daily; last LDL at goal <70 o Current antiplatelet regimen: ASA 81 mg daily + clopidogrel 75 mg daily . Depression/anxiety: sertraline 150mg  daily, lorazepam 0.5 mg, taking 2 pm for sleep. Requests a refill on sertraline today before she goes out of town this weekend. Reports improvement in daytime anxiety w/ increased dose of sertraline . PMR: [facility]. MTX 25 mg weekly, folic acid 1 mg daily. Follows w/ Dr. Meda Coffee  Pharmacist Clinical Goal(s):  Marland Kitchen Over the next 90 days, patient will work with PharmD and primary care provider to  address optimized   Interventions: . Comprehensive medication review performed, medication list updated in electronic medical record . Inter-disciplinary care team collaboration (see longitudinal plan of care) . Walked through [employer] CGM target goal to 80-180 for better interpretation of reading . Reviewed that she is not taking Basaglar as directed d/t worries about hypoglycemia after supper/early evening. Reviewed that we do not want fastings >200 as they are now. Encouraged to start by increasing Basaglar to 16 units, but decrease supper Novolog to ~18-20 units, instead of 22. She verbalized understanding.  . Encouraged continued collaboration and calls to me with questions, rather than self-adjusting insulin.   Patient Self Care Activities:  . Patient will check blood glucose at least QID using FreeStyle Libre , document, and provide at future appointments . Patient will take medications as prescribed . Patient will report any questions or concerns to provider   Please see past updates related to this goal by clicking on the "Past Updates" button in the selected goal         Patient verbalizes understanding of instructions provided today.  Plan:  - Scheduled f/u call in ~ 12 weeks  Catie Darnelle Maffucci, PharmD, [place] [phone]

## 2020-05-11 NOTE — Chronic Care Management (AMB) (Signed)
°  Chronic Care Management   Note  05/11/2020 Name: MAZI SCHUFF MRN: [id number] DOB: 1946/04/24  Morey Hummingbird Roszak is a 74 y.o. year old female who is a primary care patient of Cannady, Barbaraann Faster, NP. The CCM team was consulted for assistance with chronic disease management and care coordination needs.    Attempted to contact patient for medication management review. Left HIPAA compliant message for patient to return my call at their convenience.   Plan: - Will collaborate with Care Guide to outreach to schedule follow up with me  Catie Darnelle Maffucci, PharmD, [place] [phone]

## 2020-05-20 DIAGNOSIS — Z79899 Other long term (current) drug therapy: Secondary | ICD-10-CM | POA: Diagnosis not present

## 2020-05-20 DIAGNOSIS — M0609 Rheumatoid arthritis without rheumatoid factor, multiple sites: Secondary | ICD-10-CM | POA: Diagnosis not present

## 2020-06-16 ENCOUNTER — Ambulatory Visit: Payer: Medicare Other | Admitting: Nurse Practitioner

## 2020-07-07 ENCOUNTER — Other Ambulatory Visit: Payer: Self-pay

## 2020-07-07 ENCOUNTER — Encounter: Payer: Self-pay | Admitting: Nurse Practitioner

## 2020-07-07 ENCOUNTER — Ambulatory Visit (INDEPENDENT_AMBULATORY_CARE_PROVIDER_SITE_OTHER): Payer: Medicare Other | Admitting: Nurse Practitioner

## 2020-07-07 VITALS — BP 136/77 | HR 64 | Temp 97.9°F | Wt 168.6 lb

## 2020-07-07 DIAGNOSIS — F339 Major depressive disorder, recurrent, unspecified: Secondary | ICD-10-CM

## 2020-07-07 DIAGNOSIS — N183 Chronic kidney disease, stage 3 unspecified: Secondary | ICD-10-CM

## 2020-07-07 DIAGNOSIS — E1169 Type 2 diabetes mellitus with other specified complication: Secondary | ICD-10-CM | POA: Diagnosis not present

## 2020-07-07 DIAGNOSIS — M353 Polymyalgia rheumatica: Secondary | ICD-10-CM

## 2020-07-07 DIAGNOSIS — L97529 Non-pressure chronic ulcer of other part of left foot with unspecified severity: Secondary | ICD-10-CM

## 2020-07-07 DIAGNOSIS — E1122 Type 2 diabetes mellitus with diabetic chronic kidney disease: Secondary | ICD-10-CM | POA: Diagnosis not present

## 2020-07-07 DIAGNOSIS — Z79899 Other long term (current) drug therapy: Secondary | ICD-10-CM | POA: Diagnosis not present

## 2020-07-07 DIAGNOSIS — Z794 Long term (current) use of insulin: Secondary | ICD-10-CM | POA: Diagnosis not present

## 2020-07-07 DIAGNOSIS — I25708 Atherosclerosis of coronary artery bypass graft(s), unspecified, with other forms of angina pectoris: Secondary | ICD-10-CM | POA: Diagnosis not present

## 2020-07-07 DIAGNOSIS — I2510 Atherosclerotic heart disease of native coronary artery without angina pectoris: Secondary | ICD-10-CM

## 2020-07-07 DIAGNOSIS — I771 Stricture of artery: Secondary | ICD-10-CM

## 2020-07-07 DIAGNOSIS — E11621 Type 2 diabetes mellitus with foot ulcer: Secondary | ICD-10-CM | POA: Diagnosis not present

## 2020-07-07 DIAGNOSIS — F413 Other mixed anxiety disorders: Secondary | ICD-10-CM | POA: Diagnosis not present

## 2020-07-07 DIAGNOSIS — E1159 Type 2 diabetes mellitus with other circulatory complications: Secondary | ICD-10-CM | POA: Diagnosis not present

## 2020-07-07 DIAGNOSIS — E1165 Type 2 diabetes mellitus with hyperglycemia: Secondary | ICD-10-CM | POA: Diagnosis not present

## 2020-07-07 DIAGNOSIS — E785 Hyperlipidemia, unspecified: Secondary | ICD-10-CM

## 2020-07-07 DIAGNOSIS — I10 Essential (primary) hypertension: Secondary | ICD-10-CM | POA: Diagnosis not present

## 2020-07-07 LAB — BAYER DCA HB A1C WAIVED: HB A1C (BAYER DCA - WAIVED): 7 % — ABNORMAL HIGH (ref <7.0)

## 2020-07-07 MED ORDER — LORAZEPAM 0.5 MG PO TABS: ORAL_TABLET | ORAL | 0 refills | Status: DC

## 2020-07-07 NOTE — Assessment & Plan Note (Signed)
Chronic, ongoing for insomnia.  Has had trials of other medications and reductions with poor outcomeIs.  Fully aware of risks of chronic benzo use and aware this will be discussed each visit.  UDS up to date.

## 2020-07-07 NOTE — Assessment & Plan Note (Signed)
Chronic, ongoing.  Denies SI/HI.  Continue Sertraline  150 MG daily as is ordered, which she agrees to do. This could benefit mood overall and help reduce benzo use.  Consider increase to 200 MG in future for increased benefit.  Return in 3 months. 

## 2020-07-07 NOTE — Assessment & Plan Note (Signed)
Chronic, ongoing.  Denies SI/HI.  Continue Sertraline and Ativan.  Has had multiple attempts at reduction of benzo with no success.  Discussed at length risks with her and she is aware.  Continue Ativan and fill upon request.  On review PMP last Ativan fill 04/28/20 for 3 months supply. Return in 3 months,has up to date UDS and contract.

## 2020-07-07 NOTE — Assessment & Plan Note (Signed)
Stable, continue collaboration with cardiology and preventative treatment ASA, Plavix, and statin.

## 2020-07-07 NOTE — Assessment & Plan Note (Addendum)
Chronic, stable with BP at goal in office today.  Continue current medication regimen and collaboration with cardiology. BMP and TSH today.  Recommend she monitor BP at least three days a week at home and document for providers + bring to visits.  DASH diet focus.  Return in 3 months.

## 2020-07-07 NOTE — Assessment & Plan Note (Signed)
Chronic, ongoing.  Continue collaboration with rheumatology and current medication regimen.

## 2020-07-07 NOTE — Assessment & Plan Note (Signed)
Chronic, ongoing.  Continue current medication regimen and adjust as needed. Lipid panel today. 

## 2020-07-07 NOTE — Patient Instructions (Signed)

## 2020-07-07 NOTE — Progress Notes (Signed)
BP 136/77    Pulse 64    Temp 97.9 F (36.6 C) (Oral)    Wt 168 lb 9.6 oz (76.5 kg)    SpO2 98%    BMI 28.94 kg/m    Subjective:    Patient ID: Laura Rojas, female    DOB: February 24, 1946, 74 y.o.   MRN: [id number]  HPI: VIVIEN BARRETTO is a 74 y.o. female  Chief Complaint  Patient presents with   Depression   Diabetes   Hyperlipidemia   Hypertension   DIABETES Followed by endocrinology and last saw NP [facility] in May 2021 with A1C 7.8%.  She does endorse good/bad diet.  She wears a Libre and her total average glucose was 154.  Takes daily B12 supplement due to history of low levels.  She continues on [employer], and Metformin. Hypoglycemic episodes: occasional -- 3 to 4 less than 70 Polydipsia/polyuria: no Visual disturbance: no Chest pain: no Paresthesias: no Glucose Monitoring: yes             Accucheck frequency: Daily             Fasting glucose:              Post prandial:             Evening:              Before meals: Taking Insulin?: yes             Long acting insulin: 20 units             Short acting insulin:18 units with meals Blood Pressure Monitoring: rarely Retinal Examination: Up to Date Foot Exam: Up to Date Pneumovax: Up to Date Influenza: Up to Date Aspirin: yes   SKIN LESION Been to left great toe area for 3 weeks.  No trauma.  Has been placing abx ointment and bandage over area. Duration: weeks Location: dorsal left great toe area Painful: no Itching: occasional Onset: gradual Context: not changing Associated signs and symptoms: none History of skin cancer: no History of precancerous skin lesions: no Family history of skin cancer: no  HYPERTENSION / HYPERLIPIDEMIA/CAD She is followed by cardiology, Dr. Sallyanne Kuster. Last saw 08/07/2019 with no changes.  Monitored by cardiology due to subclavian artery stenosis and angina. Continues on Amlodipine, Plavix, HCTZ, Fish oil, Simvastatin and Losartan. Satisfied with current treatment?  yes Duration of hypertension: chronic BP monitoring frequency: not checking BP range:not checking BP medication side effects: no Duration of hyperlipidemia: chronic Cholesterol medication side effects: no Cholesterol supplements: fish oil Medication compliance: good compliance Aspirin: yes Recent stressors: no Recurrent headaches: no Visual changes: no Palpitations: no Dyspnea: no Chest pain: no Lower extremity edema: no Dizzy/lightheaded: no   CHRONIC KIDNEY DISEASE CKD status: stable Medications renally dose: yes Previous renal evaluation: no Pneumovax:  Up to Date Influenza Vaccine:  Up to Date   CHRONIC JOINT PAIN On review is followed by rheumatology with diagnosis of polymyalgia rheumatica syndrome with a positive anti-CCP test.  Last seen 04/21/20.  Continues on Methotrexate shots -- she is to hold at this time due to some alopecia.   Duration: chronic Involved joints: right hand, both feet, and left knee Mechanism of injury: arthritis Severity: 10/10 at worst and 3/10 at least amount Quality:  dull, aching and burning Frequency: intermittent Radiation: no Aggravating factors: walking and movement  Alleviating factors: Aspercreme and APAP  Status: fluctuating Treatments attempted: Tylenol and Aspercreme Relief with NSAIDs?:  No NSAIDs  Taken Weakness with weight bearing or walking: no Sensation of giving way: no Locking: no Popping: no Bruising: no Swelling: no Redness: no Paresthesias/decreased sensation: no Fevers: no  ANXIETY/STRESS Taking Zoloft 150 MG daily.  Is taking Ativan 0.5 MG, takes this at night only, although ordered BID -- currently reports she takes 1 MG at night. Occasionally will take extra dose at day time, but not often, reports since last visit has had to not take extra as often after out discussion.  Pt aware of risks of benzo medication use to include increased sedation, respiratory suppression, falls, dependence and cardiovascular  events.  Pt would like to continue treatment as benefit determined to outweigh risk.  On review PMP last Ativan fill 04/28/20 for 3 months supply. Duration:stable Anxious mood: yes  Excessive worrying: no Irritability: no  Sweating: no Nausea: no Palpitations:no Hyperventilation: no Panic attacks: no Agoraphobia: no  Obscessions/compulsions: no Depressed mood: no Depression screen [facility] 2/9 07/07/2020 03/17/2020 12/17/2019 10/14/2019 08/27/2019  Decreased Interest 0 0 0 1 0  Down, Depressed, Hopeless 0 0 1 3 1   PHQ - 2 Score 0 0 1 4 1   Altered sleeping 0 0 3 3 -  Tired, decreased energy 0 0 1 3 -  Change in appetite 0 0 0 2 -  Feeling bad or failure about yourself  0 0 0 1 -  Trouble concentrating 0 0 0 0 -  Moving slowly or fidgety/restless 0 0 0 0 -  Suicidal thoughts 0 0 0 0 -  PHQ-9 Score 0 0 5 13 -  Difficult doing work/chores Not difficult at all Not difficult at all - Somewhat difficult -  Some recent data might be hidden  Anhedonia: no Weight changes: no Insomnia: yes hard to fall asleep  Hypersomnia: no Fatigue/loss of energy: no Feelings of worthlessness: no Feelings of guilt: no Impaired concentration/indecisiveness: no Suicidal ideations: no  Crying spells: no Recent Stressors/Life Changes: no   Relationship problems: no   Family stress: no     Financial stress: no    Job stress: no    Recent death/loss: no GAD 7 : Generalized Anxiety Score 03/17/2020 10/14/2019 12/19/2017  Nervous, Anxious, on Edge 0 3 3  Control/stop worrying 0 3 2  Worry too much - different things 0 3 3  Trouble relaxing 0 3 3  Restless 0 0 3  Easily annoyed or irritable 0 3 3  Afraid - awful might happen 0 3 2  Total GAD 7 Score 0 18 19  Anxiety Difficulty Not difficult at all Somewhat difficult Very difficult    Relevant past medical, surgical, family and social history reviewed and updated as indicated. Interim medical history since our last visit reviewed. Allergies and medications  reviewed and updated.  Review of Systems  Constitutional: Negative for activity change, appetite change, diaphoresis, fatigue and fever.  Respiratory: Negative for cough, chest tightness and shortness of breath.   Cardiovascular: Negative for chest pain, palpitations and leg swelling.  Gastrointestinal: Negative.   Endocrine: Negative for polydipsia, polyphagia and polyuria.  Neurological: Negative.   Psychiatric/Behavioral: Negative.     Per HPI unless specifically indicated above     Objective:    BP 136/77    Pulse 64    Temp 97.9 F (36.6 C) (Oral)    Wt 168 lb 9.6 oz (76.5 kg)    SpO2 98%    BMI 28.94 kg/m   Wt Readings from Last 3 Encounters:  07/07/20 168 lb 9.6 oz (76.5 kg)  05/04/20 167 lb (75.8 kg)  04/13/20 166 lb (75.3 kg)    Physical Exam Vitals and nursing note reviewed.  Constitutional:      General: She is awake. She is not in acute distress.    Appearance: She is well-developed and overweight. She is not ill-appearing.  HENT:     Head: Normocephalic.     Right Ear: Hearing normal.     Left Ear: Hearing normal.  Eyes:     General: Lids are normal.        Right eye: No discharge.        Left eye: No discharge.     Conjunctiva/sclera: Conjunctivae normal.     Pupils: Pupils are equal, round, and reactive to light.  Neck:     Thyroid: No thyromegaly.     Vascular: No carotid bruit.  Cardiovascular:     Rate and Rhythm: Normal rate and regular rhythm.     Pulses:          Dorsalis pedis pulses are 1+ on the right side and 1+ on the left side.       Posterior tibial pulses are 1+ on the right side and 1+ on the left side.     Heart sounds: Normal heart sounds. No murmur heard.  No gallop.   Pulmonary:     Effort: Pulmonary effort is normal. No accessory muscle usage or respiratory distress.     Breath sounds: Normal breath sounds.  Abdominal:     General: Bowel sounds are normal.     Palpations: Abdomen is soft.  Musculoskeletal:     Cervical back:  Normal range of motion and neck supple.     Right lower leg: No edema.     Left lower leg: No edema.     Right foot: Normal range of motion.     Left foot: Normal range of motion.       Feet:  Feet:     Right foot:     Protective Sensation: 10 sites tested. 10 sites sensed.     Skin integrity: Callus (right great toe) present.     Toenail Condition: Right toenails are normal.     Left foot:     Protective Sensation: 10 sites tested. 10 sites sensed.     Skin integrity: Ulcer (dorsal aspect left great toe) present.     Toenail Condition: Left toenails are normal.  Skin:    General: Skin is warm and dry.  Neurological:     Mental Status: She is alert and oriented to person, place, and time.  Psychiatric:        Attention and Perception: Attention normal.        Mood and Affect: Mood normal.        Speech: Speech normal.        Behavior: Behavior normal. Behavior is cooperative.        Thought Content: Thought content normal.     Results for orders placed or performed during the hospital encounter of 05/04/20  Glucose, capillary  Result Value Ref Range   Glucose-Capillary 222 (H) 70 - 99 mg/dL  Glucose, capillary  Result Value Ref Range   Glucose-Capillary 237 (H) 70 - 99 mg/dL      Assessment & Plan:   Problem List Items Addressed This Visit      Cardiovascular and Mediastinum   Hypertension associated with diabetes ([place])    Chronic, stable with BP at goal in office today.  Continue current medication  regimen and collaboration with cardiology. BMP and TSH today.  Recommend she monitor BP at least three days a week at home and document for providers + bring to visits.  DASH diet focus.  Return in 3 months.      Relevant Orders   Bayer DCA Hb A1c Waived   Basic metabolic panel   TSH   Subclavian artery stenosis, left (HCC)    Stable, continue collaboration with cardiology and preventative treatment ASA, Plavix, and statin.      Coronary artery disease of bypass  graft of native heart with stable angina pectoris [facility])    Continue collaboration with cardiology and preventative treatment.        Endocrine   Hyperlipidemia associated with type 2 diabetes mellitus (HCC)    Chronic, ongoing.  Continue current medication regimen and adjust as needed.  Lipid panel today.      Relevant Orders   Bayer DCA Hb A1c Waived   Lipid Panel w/o Chol/HDL Ratio   CKD stage 3 due to type 2 diabetes mellitus (HCC)    Chronic, stable.  Continue Losartan for kidney protection.  Urine micro in office last visit ALB 30 and A:C <30. Continue collaboration with cardiology and endocrinology.  Consider nephrology referral if worsening function.  BMP today.      Type 2 diabetes mellitus with hyperglycemia, with long-term current use of insulin (HCC) - Primary    Chronic, ongoing, followed by endocrinology with recent A1C 7.8% and in office today 7%, praised for improvement.  Continue this collaboration and current medication regimen + collaboration with CCM team in office.  Would benefit from trial of SGLT in future (especially with cardiac health), will discuss with endocrinology.  Bring Libre to next visit.  Return to office in 3 months.      Relevant Orders   Bayer DCA Hb A1c Waived   Diabetic ulcer of left great toe [facility])    Referral to podiatry for further recommendations, may benefit from diabetic shoes as current issue appears pressure related.      Relevant Orders   Ambulatory referral to Podiatry     Other   Anxiety disorder    Chronic, ongoing.  Denies SI/HI.  Continue Sertraline and Ativan.  Has had multiple attempts at reduction of benzo with no success.  Discussed at length risks with her and she is aware.  Continue Ativan and fill upon request.  On review PMP last Ativan fill 04/28/20 for 3 months supply. Return in 3 months,has up to date UDS and contract.      Relevant Medications   LORazepam (ATIVAN) 0.5 MG tablet (Start on 07/29/2020)   Polymyalgia  rheumatica (HCC)    Chronic, ongoing.  Continue collaboration with rheumatology and current medication regimen.        Depression, recurrent (HCC)    Chronic, ongoing.  Denies SI/HI.  Continue Sertraline  150 MG daily as is ordered, which she agrees to do. This could benefit mood overall and help reduce benzo use.  Consider increase to 200 MG in future for increased benefit.  Return in 3 months.      Relevant Medications   LORazepam (ATIVAN) 0.5 MG tablet (Start on 07/29/2020)   Long-term current use of benzodiazepine    Chronic, ongoing for insomnia.  Has had trials of other medications and reductions with poor outcomeIs.  Fully aware of risks of chronic benzo use and aware this will be discussed each visit.  UDS up to date.  Follow up plan: Return in about 3 months (around 10/07/2020) for T2DM, HTN/HLD, MOOD.

## 2020-07-07 NOTE — Assessment & Plan Note (Signed)
Chronic, stable.  Continue Losartan for kidney protection.  Urine micro in office last visit ALB 30 and A:C <30. Continue collaboration with cardiology and endocrinology.  Consider nephrology referral if worsening function.  BMP today. 

## 2020-07-07 NOTE — Assessment & Plan Note (Signed)
Continue collaboration with cardiology and preventative treatment.

## 2020-07-07 NOTE — Assessment & Plan Note (Signed)
Referral to podiatry for further recommendations, may benefit from diabetic shoes as current issue appears pressure related.

## 2020-07-07 NOTE — Assessment & Plan Note (Signed)
Chronic, ongoing, followed by endocrinology with recent A1C 7.8% and in office today 7%, praised for improvement.  Continue this collaboration and current medication regimen + collaboration with CCM team in office.  Would benefit from trial of SGLT in future (especially with cardiac health), will discuss with endocrinology.  Bring Libre to next visit.  Return to office in 3 months.

## 2020-07-08 LAB — BASIC METABOLIC PANEL
BUN/Creatinine Ratio: 19 (ref 12–28)
BUN: 20 mg/dL (ref 8–27)
CO2: 23 mmol/L (ref 20–29)
Calcium: 9.6 mg/dL (ref 8.7–10.3)
Chloride: 103 mmol/L (ref 96–106)
Creatinine, Ser: 1.05 mg/dL — ABNORMAL HIGH (ref 0.57–1.00)
GFR calc Af Amer: 60 mL/min/{1.73_m2} (ref >59)
GFR calc non Af Amer: 52 mL/min/{1.73_m2} — ABNORMAL LOW (ref >59)
Glucose: 185 mg/dL — ABNORMAL HIGH (ref 65–99)
Potassium: 4.4 mmol/L (ref 3.5–5.2)
Sodium: 140 mmol/L (ref 134–144)

## 2020-07-08 LAB — LIPID PANEL W/O CHOL/HDL RATIO
Cholesterol, Total: 164 mg/dL (ref 100–199)
HDL: 56 mg/dL (ref >39)
LDL Chol Calc (NIH): 75 mg/dL (ref 0–99)
Triglycerides: 199 mg/dL — ABNORMAL HIGH (ref 0–149)
VLDL Cholesterol Cal: 33 mg/dL (ref 5–40)

## 2020-07-08 LAB — TSH: TSH: 2.66 u[IU]/mL (ref 0.450–4.500)

## 2020-07-08 NOTE — Progress Notes (Signed)
Contacted via [place] morning [place], your labs have returned.  Kidney function (creatinine and GFR) continue to show some mild kidney disease with no progression.  We can continue to monitor this and I recommend ensuring good water intake daily and continues Losartan for kidney protection.  Cholesterol levels are good with some mild elevation in triglycerides we will continue to monitor.  Thyroid lab is normal.  Have a great day!! Keep being awesome!! Kindest regards, Cayleb Jarnigan

## 2020-07-14 DIAGNOSIS — E1159 Type 2 diabetes mellitus with other circulatory complications: Secondary | ICD-10-CM | POA: Diagnosis not present

## 2020-07-14 DIAGNOSIS — E785 Hyperlipidemia, unspecified: Secondary | ICD-10-CM | POA: Diagnosis not present

## 2020-07-14 DIAGNOSIS — E1169 Type 2 diabetes mellitus with other specified complication: Secondary | ICD-10-CM | POA: Diagnosis not present

## 2020-07-14 DIAGNOSIS — Z794 Long term (current) use of insulin: Secondary | ICD-10-CM | POA: Diagnosis not present

## 2020-07-14 DIAGNOSIS — I152 Hypertension secondary to endocrine disorders: Secondary | ICD-10-CM | POA: Diagnosis not present

## 2020-07-14 DIAGNOSIS — E1165 Type 2 diabetes mellitus with hyperglycemia: Secondary | ICD-10-CM | POA: Diagnosis not present

## 2020-07-19 ENCOUNTER — Other Ambulatory Visit: Payer: Self-pay | Admitting: Nurse Practitioner

## 2020-07-19 DIAGNOSIS — Z1231 Encounter for screening mammogram for malignant neoplasm of breast: Secondary | ICD-10-CM

## 2020-07-20 DIAGNOSIS — L84 Corns and callosities: Secondary | ICD-10-CM | POA: Diagnosis not present

## 2020-07-20 DIAGNOSIS — L309 Dermatitis, unspecified: Secondary | ICD-10-CM | POA: Diagnosis not present

## 2020-07-20 DIAGNOSIS — Z794 Long term (current) use of insulin: Secondary | ICD-10-CM | POA: Diagnosis not present

## 2020-07-20 DIAGNOSIS — M898X7 Other specified disorders of bone, ankle and foot: Secondary | ICD-10-CM | POA: Diagnosis not present

## 2020-07-20 DIAGNOSIS — E1165 Type 2 diabetes mellitus with hyperglycemia: Secondary | ICD-10-CM | POA: Diagnosis not present

## 2020-07-27 DIAGNOSIS — M0609 Rheumatoid arthritis without rheumatoid factor, multiple sites: Secondary | ICD-10-CM | POA: Diagnosis not present

## 2020-07-29 ENCOUNTER — Other Ambulatory Visit: Payer: Self-pay | Admitting: Nurse Practitioner

## 2020-07-29 NOTE — Telephone Encounter (Signed)
Requested medication (s) are due for refill today: yes  Requested medication (s) are on the active medication list: yes  Last refill: 07/07/20 #180  Future visit scheduled: yes  Notes to clinic:  Please review for refill. Refill not delegated per protocol    Requested Prescriptions  Pending Prescriptions Disp Refills   LORazepam (ATIVAN) 0.5 MG tablet [Pharmacy Med Name: LORAZEPAM 0.5 MG TABLET] 180 tablet 0    Sig: TAKE 1 TAB BY MOUTH TWICE A DAY (USE THE ADDITIONAL 30 TABS ONLY SPARINGLY & ONLY FOR SEVERE ANIETY)      Not Delegated - Psychiatry:  Anxiolytics/Hypnotics Failed - 07/29/2020  7:26 PM      Failed - This refill cannot be delegated      Passed - Urine Drug Screen completed in last 360 days.      Passed - Valid encounter within last 6 months    Recent Outpatient Visits           3 weeks ago Type 2 diabetes mellitus with hyperglycemia, with long-term current use of insulin ([place])   [place], Jolene T, NP   4 months ago Type 2 diabetes mellitus with hyperglycemia, with long-term current use of insulin ([place])   [place], [place] T, NP   7 months ago Hypertension associated with diabetes ([place])   [place], Jolene T, NP   8 months ago Close exposure to COVID-19 virus   [employer], Henrine Screws T, NP   9 months ago Polymyalgia rheumatica ([place])   [place], Jolene T, NP       Future Appointments             In 3 weeks Ralene Bathe, MD [place]   In 2 months [place], Mihai, MD [place], [facility]   In 2 months [place], Barbaraann Faster, NP [employer], [facility]

## 2020-08-04 ENCOUNTER — Telehealth: Payer: Self-pay | Admitting: Pharmacist

## 2020-08-04 ENCOUNTER — Telehealth: Payer: Self-pay

## 2020-08-04 NOTE — Progress Notes (Signed)
  Chronic Care Management   Outreach Note  08/04/2020 Name: Laura Rojas MRN: [id number] DOB: 03/26/1946  Referred by: Venita Lick, NP Reason for referral : No chief complaint on file.   An unsuccessful telephone outreach was attempted today. The patient was referred to the pharmacist for assistance with care management and care coordination.  Left HIPAA compliant message  to return my call at her convenience.  Will collaborate with care guide for reschedule.  Junita Push. Kenton Kingfisher PharmD, [facility] [phone]

## 2020-08-10 DIAGNOSIS — M0609 Rheumatoid arthritis without rheumatoid factor, multiple sites: Secondary | ICD-10-CM | POA: Diagnosis not present

## 2020-08-13 ENCOUNTER — Telehealth: Payer: Self-pay

## 2020-08-13 NOTE — Chronic Care Management (AMB) (Signed)
Care Management   Note  08/13/2020 Name: DAMONIQUE WARBINGTON MRN: [id number] DOB: 03/20/1946  [name] Grems is a 74 y.o. year old female who is a primary care patient of Marjie Skiff, NP and is actively engaged with the care management team. I reached out to [name] by phone today to assist with re-scheduling a follow up visit with the Pharmacist  Follow up plan: Unsuccessful telephone outreach attempt made. A HIPAA compliant phone message was left for the patient providing contact information and requesting a return call.  The care management team will reach out to the patient again over the next 7 days.  If patient returns call to provider office, please advise to call Embedded Care Management Care Guide Penne Lash  at [phone]  Penne Lash, RMA Care Guide, Embedded Care Coordination [facility]  [place], [place] [address] Direct Dial: [phone] [email or web address]

## 2020-08-13 NOTE — Telephone Encounter (Signed)
Pt has been r/s  

## 2020-08-13 NOTE — Chronic Care Management (AMB) (Signed)
Care Management   Note  08/13/2020 Name: [name] MRN: [id number] DOB: [birth year]  [name] Laura Rojas is a 74 y.o. year old female who is a primary care patient of Marjie Skiff, NP and is actively engaged with the care management team. I reached out to [name] by phone today to assist with re-scheduling a follow up visit with the Pharmacist  Follow up plan: Telephone appointment with care management team member scheduled for:09/13/2020  Penne Lash, RMA Care Guide, Embedded Care Coordination [facility]  [place], [place] [address] Direct Dial: [phone] [email or web address]

## 2020-08-18 ENCOUNTER — Ambulatory Visit
Admission: RE | Admit: 2020-08-18 | Discharge: 2020-08-18 | Disposition: A | Discharge status: Home / Self Care | Payer: Medicare Other | Source: Ambulatory Visit | Attending: Nurse Practitioner | Admitting: Nurse Practitioner

## 2020-08-18 ENCOUNTER — Other Ambulatory Visit: Payer: Self-pay

## 2020-08-18 DIAGNOSIS — Z1231 Encounter for screening mammogram for malignant neoplasm of breast: Secondary | ICD-10-CM | POA: Diagnosis not present

## 2020-08-23 ENCOUNTER — Ambulatory Visit (INDEPENDENT_AMBULATORY_CARE_PROVIDER_SITE_OTHER): Payer: Medicare Other | Admitting: Dermatology

## 2020-08-23 ENCOUNTER — Other Ambulatory Visit: Payer: Self-pay

## 2020-08-23 DIAGNOSIS — Z1283 Encounter for screening for malignant neoplasm of skin: Secondary | ICD-10-CM | POA: Diagnosis not present

## 2020-08-23 DIAGNOSIS — L853 Xerosis cutis: Secondary | ICD-10-CM | POA: Diagnosis not present

## 2020-08-23 DIAGNOSIS — L578 Other skin changes due to chronic exposure to nonionizing radiation: Secondary | ICD-10-CM

## 2020-08-23 DIAGNOSIS — L820 Inflamed seborrheic keratosis: Secondary | ICD-10-CM

## 2020-08-23 DIAGNOSIS — M8949 Other hypertrophic osteoarthropathy, multiple sites: Secondary | ICD-10-CM | POA: Diagnosis not present

## 2020-08-23 DIAGNOSIS — L821 Other seborrheic keratosis: Secondary | ICD-10-CM | POA: Diagnosis not present

## 2020-08-23 DIAGNOSIS — D229 Melanocytic nevi, unspecified: Secondary | ICD-10-CM

## 2020-08-23 DIAGNOSIS — L814 Other melanin hyperpigmentation: Secondary | ICD-10-CM

## 2020-08-23 DIAGNOSIS — M0609 Rheumatoid arthritis without rheumatoid factor, multiple sites: Secondary | ICD-10-CM | POA: Diagnosis not present

## 2020-08-23 DIAGNOSIS — D1800 Hemangioma unspecified site: Secondary | ICD-10-CM

## 2020-08-23 DIAGNOSIS — I2510 Atherosclerotic heart disease of native coronary artery without angina pectoris: Secondary | ICD-10-CM | POA: Diagnosis not present

## 2020-08-23 DIAGNOSIS — L720 Epidermal cyst: Secondary | ICD-10-CM

## 2020-08-23 DIAGNOSIS — Z79899 Other long term (current) drug therapy: Secondary | ICD-10-CM | POA: Diagnosis not present

## 2020-08-23 DIAGNOSIS — Z111 Encounter for screening for respiratory tuberculosis: Secondary | ICD-10-CM | POA: Diagnosis not present

## 2020-08-23 NOTE — Patient Instructions (Addendum)
Return to clinic if area on left temple is still there after 6 weeks  Recommend daily broad spectrum sunscreen SPF 30+ to sun-exposed areas, reapply every 2 hours as needed. Call for new or changing lesions.  Cryotherapy Aftercare  . Wash gently with soap and water everyday.   Marland Kitchen Apply Vaseline and Band-Aid daily until healed.  Prior to procedure, discussed risks of blister formation, small wound, skin dyspigmentation, or rare scar following cryotherapy.  Liquid nitrogen was applied for 10-12 seconds to the skin lesion and the expected blistering or scabbing reaction explained. Do not pick at the area. Patient reminded to expect hypopigmented scars from the procedure. Return if lesion fails to fully resolve.   Gentle Skin Care Guide  1. Bathe no more than once a day.  2. Avoid bathing in hot water  3. Use a mild soap like Dove, Vanicream, Cetaphil, CeraVe. Can use Lever 2000 or Cetaphil antibacterial soap  4. Use soap only where you need it. On most days, use it under your arms, between your legs, and on your feet. Let the water rinse other areas unless visibly dirty.  5. When you get out of the bath/shower, use a towel to gently blot your skin dry, don't rub it.  6. While your skin is still a little damp, apply a moisturizing cream such as Vanicream, CeraVe, Cetaphil, Eucerin, Sarna lotion or plain Vaseline Jelly. For hands apply Neutrogena [place] Hand Cream or Excipial Hand Cream.  7. Reapply moisturizer any time you start to itch or feel dry.  8. Sometimes using free and clear laundry detergents can be helpful. Fabric softener sheets should be avoided. Downy Free & Gentle liquid, or any liquid fabric softener that is free of dyes and perfumes, it acceptable to use  9. If your doctor has given you prescription creams you may apply moisturizers over them

## 2020-08-23 NOTE — Progress Notes (Signed)
   New Patient Visit  Subjective  Laura Rojas is a 74 y.o. female who presents for the following: New Patient (Initial Visit) (To re-establish care). Patient presents today for Patient here for full body skin exam and skin cancer screening. Patient has no concerns or h/o skin cancer The patient presents for Total-Body Skin Exam (TBSE) for skin cancer screening and mole check.  The following portions of the chart were reviewed this encounter and updated as appropriate:  Tobacco  Allergies  Meds  Problems  Med Hx  Surg Hx  Fam Hx     Review of Systems:  No other skin or systemic complaints except as noted in HPI or Assessment and Plan.  Objective  Well appearing patient in no apparent distress; mood and affect are within normal limits.  A full examination was performed including scalp, head, eyes, ears, nose, lips, neck, chest, axillae, abdomen, back, buttocks, bilateral upper extremities, bilateral lower extremities, hands, feet, fingers, toes, fingernails, and toenails. All findings within normal limits unless otherwise noted below.  Objective  Left Temple, Right Chest: Erythematous keratotic or waxy stuck-on papule or plaque.   Objective  Face: Smooth white papule(s).    Assessment & Plan  Inflamed seborrheic keratosis (2) Right Chest; Left Temple  Cryotherapy today Prior to procedure, discussed risks of blister formation, small wound, skin dyspigmentation, or rare scar following cryotherapy.   If still present after 6 weeks RTC   Destruction of lesion - Left Temple, Right Chest Complexity: simple   Destruction method: cryotherapy   Informed consent: discussed and consent obtained   Timeout:  patient name, date of birth, surgical site, and procedure verified Lesion destroyed using liquid nitrogen: Yes   Region frozen until ice ball extended beyond lesion: Yes   Outcome: patient tolerated procedure well with no complications   Post-procedure details: wound care  instructions given    Milia Face Benign, observe.    Lentigines - Scattered tan macules - Discussed due to sun exposure - Benign, observe - Call for any changes  Seborrheic Keratoses - Stuck-on, waxy, tan-brown papules and plaques  - Discussed benign etiology and prognosis. - Observe - Call for any changes  Melanocytic Nevi - Tan-brown and/or pink-flesh-colored symmetric macules and papules - Benign appearing on exam today - Observation - Call clinic for new or changing moles - Recommend daily use of broad spectrum spf 30+ sunscreen to sun-exposed areas.   Hemangiomas - Red papules - Discussed benign nature - Observe - Call for any changes  Actinic Damage - diffuse scaly erythematous macules with underlying dyspigmentation - Recommend daily broad spectrum sunscreen SPF 30+ to sun-exposed areas, reapply every 2 hours as needed.  - Call for new or changing lesions.  Skin cancer screening performed today.  Xerosis - diffuse xerotic patches - recommend gentle, hydrating skin care - gentle skin care handout given  Return in about 1 year (around 08/23/2021) for TBSE.  I, Donzetta Kohut, CMA, am acting as scribe for Sarina Ser, MD . Documentation: I have reviewed the above documentation for accuracy and completeness, and I agree with the above.  Sarina Ser, MD

## 2020-08-24 ENCOUNTER — Encounter: Payer: Self-pay | Admitting: Dermatology

## 2020-08-30 ENCOUNTER — Ambulatory Visit (INDEPENDENT_AMBULATORY_CARE_PROVIDER_SITE_OTHER): Payer: Medicare Other

## 2020-08-30 VITALS — Ht 65.0 in | Wt 167.0 lb

## 2020-08-30 DIAGNOSIS — Z0000 Encounter for general adult medical examination without abnormal findings: Secondary | ICD-10-CM

## 2020-08-30 NOTE — Progress Notes (Signed)
I connected with [name] today by telephone and verified that I am speaking with the correct person using two identifiers. Location patient: home Location provider: work Persons participating in the virtual visit: Peyton Rossner, Glenna Durand LPN.   I discussed the limitations, risks, security and privacy concerns of performing an evaluation and management service by telephone and the availability of in person appointments. I also discussed with the patient that there may be a patient responsible charge related to this service. The patient expressed understanding and verbally consented to this telephonic visit.    Interactive audio and video telecommunications were attempted between this provider and patient, however failed, due to patient having technical difficulties OR patient did not have access to video capability.  We continued and completed visit with audio only.     Vital signs may be patient reported or missing.  Subjective:   Laura Rojas is a 74 y.o. female who presents for Medicare Annual (Subsequent) preventive examination.  Review of Systems     Cardiac Risk Factors include: advanced age (>24mn, >>5women);diabetes mellitus;dyslipidemia;hypertension;sedentary lifestyle     Objective:    Today's Vitals   08/30/20 0811  Weight: 167 lb (75.8 kg)  Height: 5' 5"  (1.651 m)   Body mass index is 27.79 kg/m.  Advanced Directives 08/30/2020 05/04/2020 04/13/2020 08/27/2019 08/19/2019 08/21/2018 07/18/2017  Does Patient Have a Medical Advance Directive? No No No No No No No  Would patient like information on creating a medical advance directive? - No - Patient declined Yes (MAU/Ambulatory/Procedural Areas - Information given) - - Yes (MAU/Ambulatory/Procedural Areas - Information given) No - Patient declined    Current Medications (verified) Outpatient Encounter Medications as of 08/30/2020  Medication Sig  . amLODipine (NORVASC) 2.5 MG tablet TAKE 1 TABLET BY MOUTH EVERY  DAY  . aspirin 81 MG tablet Take 81 mg by mouth daily.  . cholecalciferol (VITAMIN D3) 25 MCG (1000 UT) tablet Take 1,000 Units by mouth daily.  . clopidogrel (PLAVIX) 75 MG tablet TAKE 1 TABLET BY MOUTH EVERY DAY  . Continuous Blood Gluc Sensor (FREESTYLE LIBRE 14 DAY SENSOR) MISC Use 1 kit every 14 (fourteen) days E11.65  . hydrochlorothiazide (MICROZIDE) 12.5 MG capsule Take 1 capsule (12.5 mg total) by mouth daily.  . Insulin Glargine (BASAGLAR KWIKPEN) 100 UNIT/ML SOPN Inject 20 Units into the skin at bedtime.   .Marland KitchenLORazepam (ATIVAN) 0.5 MG tablet TAKE 1 TAB BY MOUTH TWICE A DAY (USE THE ADDITIONAL 30 TABS ONLY SPARINGLY & ONLY FOR SEVERE ANXIETY)  . losartan (COZAAR) 100 MG tablet Take 1 tablet (100 mg total) by mouth daily.  . Magnesium 400 MG TABS Take 1 tablet by mouth daily.  . metFORMIN (GLUCOPHAGE) 500 MG tablet Take 500 mg by mouth 2 (two) times daily.   . Multiple Vitamin (MULTIVITAMIN) tablet Take 1 tablet by mouth daily.  .Marland KitchenNOVOLOG 100 UNIT/ML injection Inject 10 Units into the skin 3 (three) times daily.  . Omega-3 Fatty Acids (FISH OIL) 1200 MG CAPS Take 1,200 mg by mouth daily.  . pantoprazole (PROTONIX) 40 MG tablet TAKE 1 TABLET BY MOUTH EVERY DAY  . sertraline (ZOLOFT) 100 MG tablet Take 1.5 tablets (150 mg total) by mouth daily.  . simvastatin (ZOCOR) 40 MG tablet TAKE 1 TABLET (40 MG TOTAL) BY MOUTH DAILY AT 6 PM.  . vitamin B-12 (CYANOCOBALAMIN) 1000 MCG tablet Take 1,000 mcg by mouth daily.  . folic acid (FOLVITE) 1 MG tablet Take 1 mg by mouth daily. (Patient not  taking: Reported on 08/23/2020)  . methotrexate 50 MG/2ML injection Inject 25 mg into the skin once a week.  (Patient not taking: Reported on 07/07/2020)   No facility-administered encounter medications on file as of 08/30/2020.    Allergies (verified) Compazine [prochlorperazine edisylate], Altace [ramipril], Crestor [rosuvastatin calcium], Reglan [metoclopramide], Sulfa antibiotics, Actos [pioglitazone],  Amlodipine, Latex, and Lipitor [atorvastatin]   History: Past Medical History:  Diagnosis Date  . Arthritis    Shoulders, hands, feet  . CAD (coronary artery disease) 12/31/2013   2007, 3.0 x 28 mm drug-eluting Cypher stent to RCA, 3.5 x 33 mm drug-eluting Cypher stent and proximal LAD) Consent by 2009 Normal perfusion by mildly 2013   . Diabetes mellitus without complication ([place])   . Dyslipidemia   . GERD (gastroesophageal reflux disease)   . Hypertension   . Polymyalgia rheumatica syndrome ([place])   . Subclavian artery stenosis, left ([place]) 01/01/2015   Past Surgical History:  Procedure Laterality Date  . BREAST EXCISIONAL BIOPSY Right 1970   EXCISIONAL - NEG  . BREAST EXCISIONAL BIOPSY Left 1973   EXCISIONAL - NEG  . BREAST SURGERY    . CARDIAC CATHETERIZATION  08/25/2008   patent stents  . CAROTID DUPLEX  10/2006   NORMAL CAROTID ARTERY DUPLEX  . CATARACT EXTRACTION W/PHACO Left 04/13/2020   Procedure: CATARACT EXTRACTION PHACO AND INTRAOCULAR LENS PLACEMENT (IOC) LEFT DIABETIC 4.69  00:44.0;  Surgeon: Birder Robson, MD;  Location: [facility];  Service: Ophthalmology;  Laterality: Left;  Diabetic - insulin and oral meds  . CATARACT EXTRACTION W/PHACO Right 05/04/2020   Procedure: CATARACT EXTRACTION PHACO AND INTRAOCULAR LENS PLACEMENT (IOC) RIGHT DIABETIC 3.92  00:27.1;  Surgeon: Birder Robson, MD;  Location: [facility];  Service: Ophthalmology;  Laterality: Right;  Diabetic - insulin and oral meds  . COLONOSCOPY WITH PROPOFOL N/A 08/19/2019   Procedure: COLONOSCOPY WITH PROPOFOL;  Surgeon: Lucilla Lame, MD;  Location[facility] ENDOSCOPY;  Service: Endoscopy;  Laterality: N/A;  . CORONARY ANGIOPLASTY WITH STENT PLACEMENT  09/17/2006   PTCA & stenting LAD  . ESOPHAGOGASTRODUODENOSCOPY N/A 04/27/2015   Procedure: ESOPHAGOGASTRODUODENOSCOPY (EGD);  Surgeon: Lucilla Lame, MD;  Location: [facility] ENDOSCOPY;  Service: Endoscopy;  Laterality: N/A;  . JOINT REPLACEMENT    . NM  MYOCAR MULTIPLE W/SPECT  11/2011   EF 72%.NORMAL MYOCARDIAL PERFUSION STUDY  . REPLACEMENT TOTAL KNEE  2003 & 2004  . TRANSTHORACIC ECHOCARDIOGRAM  09/2006   EF =>55%. IMPAIRED LV RELAXATION. MILD MITRAL ANNULAR CALCIFICATION. AV-MILDLY SCLEROTIC. NO AS.  Marland Kitchen VAGINAL HYSTERECTOMY  1983   Family History  Problem Relation Age of Onset  . Breast cancer Mother 38  . Diabetes Sister   . Heart disease Maternal Grandmother   . Heart disease Maternal Grandfather   . Brain cancer Grandchild    Social History   Socioeconomic History  . Marital status: Married    Spouse name: Not on file  . Number of children: Not on file  . Years of education: Not on file  . Highest education level: Some college, no degree  Occupational History  . Not on file  Tobacco Use  . Smoking status: Never Smoker  . Smokeless tobacco: Never Used  Vaping Use  . Vaping Use: Never used  Substance and Sexual Activity  . Alcohol use: No    Alcohol/week: 0.0 standard drinks  . Drug use: Never  . Sexual activity: Not on file  Other Topics Concern  . Not on file  Social History Narrative  . Not on  file   Social Determinants of Health   Financial Resource Strain: Low Risk   . Difficulty of Paying Living Expenses: Not hard at all  Food Insecurity: No Food Insecurity  . Worried About Charity fundraiser in the Last Year: Never true  . Ran Out of Food in the Last Year: Never true  Transportation Needs: No Transportation Needs  . Lack of Transportation (Medical): No  . Lack of Transportation (Non-Medical): No  Physical Activity: Inactive  . Days of Exercise per Week: 0 days  . Minutes of Exercise per Session: 0 min  Stress: No Stress Concern Present  . Feeling of Stress : Not at all  Social Connections:   . Frequency of Communication with Friends and Family: Not on file  . Frequency of Social Gatherings with Friends and Family: Not on file  . Attends Religious Services: Not on file  . Active Member of Clubs or  Organizations: Not on file  . Attends Archivist Meetings: Not on file  . Marital Status: Not on file    Tobacco Counseling Counseling given: Not Answered   Clinical Intake:  Pre-visit preparation completed: Yes  Pain : No/denies pain     Nutritional Status: BMI 25 -29 Overweight Nutritional Risks: None Diabetes: Yes  How often do you need to have someone help you when you read instructions, pamphlets, or other written materials from your doctor or pharmacy?: 1 - Never What is the last grade level you completed in school?: 52yrcollege  Diabetic? Yes Nutrition Risk Assessment:  Has the patient had any N/V/D within the last 2 months?  No  Does the patient have any non-healing wounds?  No  Has the patient had any unintentional weight loss or weight gain?  No   Diabetes:  Is the patient diabetic?  Yes  If diabetic, was a CBG obtained today?  No  Did the patient bring in their glucometer from home?  No  How often do you monitor your CBG's? Several times daily.   Financial Strains and Diabetes Management:  Are you having any financial strains with the device, your supplies or your medication? No .  Does the patient want to be seen by Chronic Care Management for management of their diabetes?  No  Would the patient like to be referred to a Nutritionist or for Diabetic Management?  No   Diabetic Exams:  Diabetic Eye Exam: Completed 02/26/2020 Diabetic Foot Exam: Completed 12/17/2019   Interpreter Needed?: No  Information entered by :: NAllen LPN   Activities of Daily Living In your present state of health, do you have any difficulty performing the following activities: 08/30/2020 05/04/2020  Hearing? N N  Vision? N N  Difficulty concentrating or making decisions? N N  Walking or climbing stairs? N Y  Dressing or bathing? N N  Doing errands, shopping? N -  Preparing Food and eating ? N -  Using the Toilet? N -  In the past six months, have you accidently  leaked urine? N -  Do you have problems with loss of bowel control? N -  Managing your Medications? N -  Managing your Finances? N -  Housekeeping or managing your Housekeeping? N -  Some recent data might be hidden    Patient Care Team: CVenita Lick NP as PCP - General (Nurse Practitioner) BMarlowe Sax MD as Referring Physician (Internal Medicine) BWarnell Forester NP as Nurse Practitioner (Surgery)  Indicate any recent Medical Services you may have received from  other than [facility] providers in the past year (date may be approximate).     Assessment:   This is a routine wellness examination for Breya.  Hearing/Vision screen  Hearing Screening   125Hz  250Hz  500Hz  1000Hz  2000Hz  3000Hz  4000Hz  6000Hz  8000Hz   Right ear:           Left ear:           Vision Screening Comments: Regular eye exams, [facility]  Dietary issues and exercise activities discussed:    Goals    .  Increase water intake      Recommend drinking at least 6-8 glasses of water a day     .  Patient Stated      08/30/2020, wants to manage diabetes    .  PharmD "My sugars run high" (pt-stated)      CARE PLAN ENTRY (see longtitudinal plan of care for additional care plan information)  Current Barriers:  . Diabetes: uncontrolled; complicated by chronic medical conditions including HTN, CAD/PAD (hx cardiac stenting); most recent A1c 7.8%; follows w/ [facility] . Reports she had a great birthday weekend. She had time with her family, and she and her husband went up to the [facility] for the night on Sunday. Notes she didn't follow the most diabetes-appropriate diet, but enjoyed her time . Current antihyperglycemic regimen: metformin 500 mg BID, Basaglar 20 units daily- but only using 14 units, Novolog 18 units base + sliding scale TID (2 extra units for every 50 mg/dL over 150) -> 22 units usually  . Denies any hypoglycemia since she self-reduced Basaglar to 14 units . Current glucose  readings:  o Fasting readings: 200s o 2 hours after meals: usually <180 Average Glucose: 167 - last 14 days Time in Goal:  - Time in range (range is set at 100-150): 21% - Time above range: 73% - Time below range: 6% . Cardiovascular risk reduction: (followed by Dr. Orene Desanctis) o Current hypertensive regimen: amlodipine 2.5 mg daily, HCTZ 12.5 mg daily, losartan 100 mg daily o Current hyperlipidemia regimen: simvastatin 40 mg daily; last LDL at goal <70 o Current antiplatelet regimen: ASA 81 mg daily + clopidogrel 75 mg daily . Depression/anxiety: sertraline 178m daily, lorazepam 0.5 mg, taking 2 pm for sleep. Requests a refill on sertraline today before she goes out of town this weekend. Reports improvement in daytime anxiety w/ increased dose of sertraline . [facility] MTX 25 mg weekly, folic acid 1 mg daily. Follows w/ Dr. BMeda Coffee Pharmacist Clinical Goal(s):  .Marland KitchenOver the next 90 days, patient will work with PharmD and primary care provider to address optimized   Interventions: . Comprehensive medication review performed, medication list updated in electronic medical record . Inter-disciplinary care team collaboration (see longitudinal plan of care) . Walked through cDarden RestaurantsCGM target goal to 80-180 for better interpretation of reading . Reviewed that she is not taking Basaglar as directed d/t worries about hypoglycemia after supper/early evening. Reviewed that we do not want fastings >200 as they are now. Encouraged to start by increasing Basaglar to 16 units, but decrease supper Novolog to ~18-20 units, instead of 22. She verbalized understanding.  . Encouraged continued collaboration and calls to me with questions, rather than self-adjusting insulin.   Patient Self Care Activities:  . Patient will check blood glucose at least QID using FreeStyle Libre , document, and provide at future appointments . Patient will take medications as prescribed . Patient will report any  questions or concerns  to provider   Please see past updates related to this goal by clicking on the "Past Updates" button in the selected goal        Depression Screen PHQ 2/9 Scores 08/30/2020 07/07/2020 03/17/2020 12/17/2019 10/14/2019 08/27/2019 01/08/2019  PHQ - 2 Score 0 0 0 1 4 1 2   PHQ- 9 Score - 0 0 5 13 - 7    Fall Risk Fall Risk  08/30/2020 12/17/2019 10/14/2019 08/27/2019 10/15/2018  Falls in the past year? 0 0 1 1 0  Number falls in past yr: - 0 0 0 -  Injury with Fall? - 0 0 0 -  Risk for fall due to : Medication side effect - - - -  Follow up Falls evaluation completed;Education provided;Falls prevention discussed - - - -    Any stairs in or around the home? Yes  If so, are there any without handrails? No  Home free of loose throw rugs in walkways, pet beds, electrical cords, etc? Yes  Adequate lighting in your home to reduce risk of falls? Yes   ASSISTIVE DEVICES UTILIZED TO PREVENT FALLS:  Life alert? No  Use of a cane, walker or w/c? No  Grab bars in the bathroom? Yes  Shower chair or bench in shower? Yes  Elevated toilet seat or a handicapped toilet? Yes   TIMED UP AND GO:  Was the test performed? No .    Cognitive Function:     6CIT Screen 08/30/2020 12/17/2019 08/21/2018 07/18/2017  What Year? 0 points 0 points 0 points 0 points  What month? 0 points 0 points 0 points 0 points  What time? 0 points 0 points 0 points 0 points  Count back from 20 0 points 0 points 0 points 0 points  Months in reverse 0 points 0 points 0 points 0 points  Repeat phrase 0 points 0 points 2 points 0 points  Total Score 0 0 2 0    Immunizations Immunization History  Administered Date(s) Administered  . Influenza, High Dose Seasonal PF 09/04/2016, 08/23/2017, 08/28/2018, 07/14/2019  . Influenza,inj,Quad PF,6+ Mos 09/27/2015  . Influenza-Unspecified 09/18/2014  . PFIZER SARS-COV-2 Vaccination 12/04/2019, 12/25/2019  . Pneumococcal Conjugate-13 04/22/2014  . Pneumococcal  Polysaccharide-23 11/12/2008, 07/05/2016  . Td 12/21/2009, 03/17/2020  . Zoster Recombinat (Shingrix) 07/14/2019, 10/15/2019    TDAP status: Up to date Flu Vaccine status: Up to date Pneumococcal vaccine status: Up to date Covid-19 vaccine status: Completed vaccines  Qualifies for Shingles Vaccine? Yes   Zostavax completed No   Shingrix Completed?: Yes  Screening Tests Health Maintenance  Topic Date Due  . INFLUENZA VACCINE  06/27/2020  . FOOT EXAM  12/16/2020  . HEMOGLOBIN A1C  01/07/2021  . OPHTHALMOLOGY EXAM  02/25/2021  . COLONOSCOPY  08/18/2024  . TETANUS/TDAP  03/17/2030  . DEXA SCAN  Completed  . COVID-19 Vaccine  Completed  . Hepatitis C Screening  Completed  . PNA vac Low Risk Adult  Completed    Health Maintenance  Health Maintenance Due  Topic Date Due  . INFLUENZA VACCINE  06/27/2020    Colorectal cancer screening: Completed 08/19/2019. Repeat every 10 years Mammogram status: Completed 08/18/2020. Repeat every year Bone Density status: Completed 05/13/2014.   Lung Cancer Screening: (Low Dose CT Chest recommended if Age 58-80 years, 30 pack-year currently smoking OR have quit w/in 15years.) does not qualify.   Lung Cancer Screening Referral: no  Additional Screening:  Hepatitis C Screening: does qualify; Completed 07/05/2016  Vision Screening: Recommended annual ophthalmology exams  for early detection of glaucoma and other disorders of the eye. Is the patient up to date with their annual eye exam?  Yes  Who is the provider or what is the name of the office in which the patient attends annual eye exams? [facility] If pt is not established with a provider, would they like to be referred to a provider to establish care? No .   Dental Screening: Recommended annual dental exams for proper oral hygiene  Community Resource Referral / Chronic Care Management: CRR required this visit?  No   CCM required this visit?  No      Plan:     I have  personally reviewed and noted the following in the patient's chart:   . Medical and social history . Use of alcohol, tobacco or illicit drugs  . Current medications and supplements . Functional ability and status . Nutritional status . Physical activity . Advanced directives . List of other physicians . Hospitalizations, surgeries, and ER visits in previous 12 months . Vitals . Screenings to include cognitive, depression, and falls . Referrals and appointments  In addition, I have reviewed and discussed with patient certain preventive protocols, quality metrics, and best practice recommendations. A written personalized care plan for preventive services as well as general preventive health recommendations were provided to patient.     Kellie Simmering, LPN   [id number]   Nurse Notes:

## 2020-08-30 NOTE — Patient Instructions (Signed)
Laura Rojas , Thank you for taking time to come for your Medicare Wellness Visit. I appreciate your ongoing commitment to your health goals. Please review the following plan we discussed and let me know if I can assist you in the future.   Screening recommendations/referrals: Colonoscopy: completed 01/18/2019 Mammogram: completed 08/18/2020 Bone Density: completed 05/13/2014 Recommended yearly ophthalmology/optometry visit for glaucoma screening and checkup Recommended yearly dental visit for hygiene and checkup  Vaccinations: Influenza vaccine: due Pneumococcal vaccine: completed 07/05/2016 Tdap vaccine: completed 03/17/2020 Shingles vaccine: completed   Covid-19: 12/25/2019, 12/04/2019  Advanced directives: Advance directive discussed with you today. Even though you declined this today please call our office should you change your mind and we can give you the proper paperwork for you to fill out.  Conditions/risks identified: none  Next appointment: Follow up in one year for your annual wellness visit    Preventive Care 65 Years and Older, Female Preventive care refers to lifestyle choices and visits with your health care provider that can promote health and wellness. What does preventive care include?  A yearly physical exam. This is also called an annual well check.  Dental exams once or twice a year.  Routine eye exams. Ask your health care provider how often you should have your eyes checked.  Personal lifestyle choices, including:  Daily care of your teeth and gums.  Regular physical activity.  Eating a healthy diet.  Avoiding tobacco and drug use.  Limiting alcohol use.  Practicing safe sex.  Taking low-dose aspirin every day.  Taking vitamin and mineral supplements as recommended by your health care provider. What happens during an annual well check? The services and screenings done by your health care provider during your annual well check will depend on your age,  overall health, lifestyle risk factors, and family history of disease. Counseling  Your health care provider may ask you questions about your:  Alcohol use.  Tobacco use.  Drug use.  Emotional well-being.  Home and relationship well-being.  Sexual activity.  Eating habits.  History of falls.  Memory and ability to understand (cognition).  Work and work Statistician.  Reproductive health. Screening  You may have the following tests or measurements:  Height, weight, and BMI.  Blood pressure.  Lipid and cholesterol levels. These may be checked every 5 years, or more frequently if you are over 36 years old.  Skin check.  Lung cancer screening. You may have this screening every year starting at age 73 if you have a 30-pack-year history of smoking and currently smoke or have quit within the past 15 years.  Fecal occult blood test (FOBT) of the stool. You may have this test every year starting at age 81.  Flexible sigmoidoscopy or colonoscopy. You may have a sigmoidoscopy every 5 years or a colonoscopy every 10 years starting at age 64.  Hepatitis C blood test.  Hepatitis B blood test.  Sexually transmitted disease (STD) testing.  Diabetes screening. This is done by checking your blood sugar (glucose) after you have not eaten for a while (fasting). You may have this done every 1-3 years.  Bone density scan. This is done to screen for osteoporosis. You may have this done starting at age 57.  Mammogram. This may be done every 1-2 years. Talk to your health care provider about how often you should have regular mammograms. Talk with your health care provider about your test results, treatment options, and if necessary, the need for more tests. Vaccines  Your health  care provider may recommend certain vaccines, such as:  Influenza vaccine. This is recommended every year.  Tetanus, diphtheria, and acellular pertussis (Tdap, Td) vaccine. You may need a Td booster every 10  years.  Zoster vaccine. You may need this after age 64.  Pneumococcal 13-valent conjugate (PCV13) vaccine. One dose is recommended after age 46.  Pneumococcal polysaccharide (PPSV23) vaccine. One dose is recommended after age 76. Talk to your health care provider about which screenings and vaccines you need and how often you need them. This information is not intended to replace advice given to you by your health care provider. Make sure you discuss any questions you have with your health care provider. Document Released: 12/10/2015 Document Revised: 08/02/2016 Document Reviewed: 09/14/2015 Elsevier Interactive Patient Education  2017 [place] Prevention in the Home Falls can cause injuries. They can happen to people of all ages. There are many things you can do to make your home safe and to help prevent falls. What can I do on the outside of my home?  Regularly fix the edges of walkways and driveways and fix any cracks.  Remove anything that might make you trip as you walk through a door, such as a raised step or threshold.  Trim any bushes or trees on the path to your home.  Use bright outdoor lighting.  Clear any walking paths of anything that might make someone trip, such as rocks or tools.  Regularly check to see if handrails are loose or broken. Make sure that both sides of any steps have handrails.  Any raised decks and porches should have guardrails on the edges.  Have any leaves, snow, or ice cleared regularly.  Use sand or salt on walking paths during winter.  Clean up any spills in your garage right away. This includes oil or grease spills. What can I do in the bathroom?  Use night lights.  Install grab bars by the toilet and in the tub and shower. Do not use towel bars as grab bars.  Use non-skid mats or decals in the tub or shower.  If you need to sit down in the shower, use a plastic, non-slip stool.  Keep the floor dry. Clean up any water that  spills on the floor as soon as it happens.  Remove soap buildup in the tub or shower regularly.  Attach bath mats securely with double-sided non-slip rug tape.  Do not have throw rugs and other things on the floor that can make you trip. What can I do in the bedroom?  Use night lights.  Make sure that you have a light by your bed that is easy to reach.  Do not use any sheets or blankets that are too big for your bed. They should not hang down onto the floor.  Have a firm chair that has side arms. You can use this for support while you get dressed.  Do not have throw rugs and other things on the floor that can make you trip. What can I do in the kitchen?  Clean up any spills right away.  Avoid walking on wet floors.  Keep items that you use a lot in easy-to-reach places.  If you need to reach something above you, use a strong step stool that has a grab bar.  Keep electrical cords out of the way.  Do not use floor polish or wax that makes floors slippery. If you must use wax, use non-skid floor wax.  Do not have  throw rugs and other things on the floor that can make you trip. What can I do with my stairs?  Do not leave any items on the stairs.  Make sure that there are handrails on both sides of the stairs and use them. Fix handrails that are broken or loose. Make sure that handrails are as long as the stairways.  Check any carpeting to make sure that it is firmly attached to the stairs. Fix any carpet that is loose or worn.  Avoid having throw rugs at the top or bottom of the stairs. If you do have throw rugs, attach them to the floor with carpet tape.  Make sure that you have a light switch at the top of the stairs and the bottom of the stairs. If you do not have them, ask someone to add them for you. What else can I do to help prevent falls?  Wear shoes that:  Do not have high heels.  Have rubber bottoms.  Are comfortable and fit you well.  Are closed at the  toe. Do not wear sandals.  If you use a stepladder:  Make sure that it is fully opened. Do not climb a closed stepladder.  Make sure that both sides of the stepladder are locked into place.  Ask someone to hold it for you, if possible.  Clearly mark and make sure that you can see:  Any grab bars or handrails.  First and last steps.  Where the edge of each step is.  Use tools that help you move around (mobility aids) if they are needed. These include:  Canes.  Walkers.  Scooters.  Crutches.  Turn on the lights when you go into a dark area. Replace any light bulbs as soon as they burn out.  Set up your furniture so you have a clear path. Avoid moving your furniture around.  If any of your floors are uneven, fix them.  If there are any pets around you, be aware of where they are.  Review your medicines with your doctor. Some medicines can make you feel dizzy. This can increase your chance of falling. Ask your doctor what other things that you can do to help prevent falls. This information is not intended to replace advice given to you by your health care provider. Make sure you discuss any questions you have with your health care provider. Document Released: 09/09/2009 Document Revised: 04/20/2016 Document Reviewed: 12/18/2014 Elsevier Interactive Patient Education  2017 [employer].

## 2020-09-06 DIAGNOSIS — M0609 Rheumatoid arthritis without rheumatoid factor, multiple sites: Secondary | ICD-10-CM | POA: Diagnosis not present

## 2020-09-10 ENCOUNTER — Telehealth: Payer: Self-pay

## 2020-09-10 MED ORDER — PANTOPRAZOLE SODIUM 40 MG PO TBEC
40.0000 mg | DELAYED_RELEASE_TABLET | Freq: Every day | ORAL | 4 refills | Status: DC
Start: 2020-09-10 — End: 2020-09-15

## 2020-09-10 NOTE — Telephone Encounter (Signed)
Patient last seen 07/07/20 and has appointment 10/13/20.

## 2020-09-13 ENCOUNTER — Ambulatory Visit: Payer: Medicare Other | Admitting: Pharmacist

## 2020-09-13 DIAGNOSIS — Z794 Long term (current) use of insulin: Secondary | ICD-10-CM

## 2020-09-13 DIAGNOSIS — E1159 Type 2 diabetes mellitus with other circulatory complications: Secondary | ICD-10-CM

## 2020-09-13 DIAGNOSIS — E1165 Type 2 diabetes mellitus with hyperglycemia: Secondary | ICD-10-CM

## 2020-09-13 NOTE — Patient Instructions (Addendum)
Visit Information  It was a pleasure speaking with you today. Thank you for letting me be part of your clinical team. Please call with any questions or concerns.   Goals Addressed            This Visit's Progress   . Pharmacy Care Plan       CARE PLAN ENTRY (see longitudinal plan of care for additional care plan information)  Current Barriers:  . Chronic Disease Management support, education, and care coordination needs related to Hypertension, Hyperlipidemia, Diabetes, Coronary Artery Disease, GERD, Chronic Kidney Disease, Depression, Anxiety, and Rheumatoid Arthritis   Hypertension BP Readings from Last 3 Encounters:  07/07/20 136/77  05/04/20 130/65  04/13/20 134/73   . Pharmacist Clinical Goal(s): o Over the next 90 days, patient will work with PharmD and providers to maintain BP goal <130/80 . Current regimen:  . Amlodipine 2.5 mg daily . HCTZ 12.5 mg daily . Losartan 100 mg daily . Interventions: o Provided diet and exercise counseling o Discussed importance of self monitoring to ensure control . Patient self care activities - Over the next 90  days, patient will: o Check your blood pressure ~1-2  times weekly. Make sure you are sitting down for at least 5 minutes before, resting calmly, with your feet flat on the floor. Write down these readings to review at future appointments. o Ensure daily salt intake < 2300 mg/day   Diabetes Lab Results  Component Value Date/Time   HGBA1C 7.0 (H) 07/07/2020 10:36 AM   HGBA1C 8.2 12/31/2019 12:00 AM   HGBA1C 8.8% 09/24/2019 12:00 AM   . Pharmacist Clinical Goal(s): o Over the next 90 days, patient will work with PharmD and providers to achieve A1c goal <7% . Current regimen:  . Metformin 500 mg bid  . Basaglar 20 unit qpm . Novolog 18 units with meals + SS . Interventions: o Provided diet and exercise counseling. o Reviewed 15-15 for treating hypoglycemia . Patient self care activities - Over the next 90 days, patient  will: o Check blood sugar 4-6 times daily , document, and provide at future appointments o Contact provider with any episodes of hypoglycemia   Medication management . Pharmacist Clinical Goal(s): o Over the next 90 days, patient will work with PharmD and providers to achieve optimal medication adherence . [facility] . Interventions o Comprehensive medication review performed. o Continue current medication management strategy . Patient self care activities - Over the next 90 days, patient will: o Focus on medication adherence by fill dates o Take medications as prescribed o Report any questions or concerns to PharmD and/or provider(s)  Initial goal documentation        The patient verbalized understanding of instructions provided today and agreed to receive a mailed copy of patient instruction and/or educational materials.  Telephone follow up appointment with pharmacy team member scheduled for: 12/21/19  Junita Push. Kenton Kingfisher PharmD, [place] Clinical Pharmacist [phone]

## 2020-09-13 NOTE — Chronic Care Management (AMB) (Signed)
Chronic Care Management Pharmacy  Name: Laura Rojas  MRN: [id number] DOB: 1946/11/18   Chief Complaint/ HPI  Laura Rojas,  74 y.o. , female presents for their Follow-Up CCM visit with the clinical pharmacist via telephone.  PCP : Venita Lick, NP Patient Care Team: Venita Lick, NP as PCP - General (Nurse Practitioner) Marlowe Sax, MD as Referring Physician (Internal Medicine) Warnell Forester, NP as Nurse Practitioner (Surgery)  Their chronic conditions include: Hypertension, Hyperlipidemia, Diabetes, Coronary Artery Disease, GERD, Chronic Kidney Disease, Depression and Anxiety, Polymyalgia Rheumatica  Office Visits: 8/11/-21 - [name], NP - labs gfr 52, a1c 7, a;c<30   Consult Visit: 07/14/20- Malissa Hippo, NP , Endocrinology- continue med, return in 3 mos 05/11/20- Catie Darnelle Maffucci, PharmD, CCM- inc Basaglar, dec Novlolg at supper, check bg qid  Allergies  Allergen Reactions  . Compazine [Prochlorperazine Edisylate] Other (See Comments)    choking  . Altace [Ramipril]   . Crestor [Rosuvastatin Calcium] Other (See Comments)    Myalgias  . Reglan [Metoclopramide]   . Sulfa Antibiotics Other (See Comments)    headache  . Actos [Pioglitazone] Other (See Comments)    Hot Flashes  . Amlodipine Swelling  . Latex Rash    (Bandaids only )  . Lipitor [Atorvastatin] Other (See Comments)    Cramps    Medications: Outpatient Encounter Medications as of 09/13/2020  Medication Sig Note  . amLODipine (NORVASC) 2.5 MG tablet TAKE 1 TABLET BY MOUTH EVERY DAY   . aspirin 81 MG tablet Take 81 mg by mouth daily.   . cholecalciferol (VITAMIN D3) 25 MCG (1000 UT) tablet Take 1,000 Units by mouth daily.   . clopidogrel (PLAVIX) 75 MG tablet TAKE 1 TABLET BY MOUTH EVERY DAY   . Continuous Blood Gluc Sensor (FREESTYLE LIBRE 14 DAY SENSOR) MISC Use 1 kit every 14 (fourteen) days J49.70   . folic acid (FOLVITE) 1 MG tablet Take 1 mg by mouth daily.  (Patient not taking: Reported on 08/23/2020)   . hydrochlorothiazide (MICROZIDE) 12.5 MG capsule Take 1 capsule (12.5 mg total) by mouth daily.   . Insulin Glargine (BASAGLAR KWIKPEN) 100 UNIT/ML SOPN Inject 20 Units into the skin at bedtime.    Marland Kitchen LORazepam (ATIVAN) 0.5 MG tablet TAKE 1 TAB BY MOUTH TWICE A DAY (USE THE ADDITIONAL 30 TABS ONLY SPARINGLY & ONLY FOR SEVERE ANXIETY)   . losartan (COZAAR) 100 MG tablet Take 1 tablet (100 mg total) by mouth daily.   . Magnesium 400 MG TABS Take 1 tablet by mouth daily.   . metFORMIN (GLUCOPHAGE) 500 MG tablet Take 500 mg by mouth 2 (two) times daily.    . methotrexate 50 MG/2ML injection Inject 25 mg into the skin once a week.  (Patient not taking: Reported on 07/07/2020)   . Multiple Vitamin (MULTIVITAMIN) tablet Take 1 tablet by mouth daily.   Marland Kitchen NOVOLOG 100 UNIT/ML injection Inject 10 Units into the skin 3 (three) times daily. 05/11/2020: 18 units + sliding scale   . Omega-3 Fatty Acids (FISH OIL) 1200 MG CAPS Take 1,200 mg by mouth daily.   . pantoprazole (PROTONIX) 40 MG tablet Take 1 tablet (40 mg total) by mouth daily.   . sertraline (ZOLOFT) 100 MG tablet Take 1.5 tablets (150 mg total) by mouth daily.   . simvastatin (ZOCOR) 40 MG tablet TAKE 1 TABLET (40 MG TOTAL) BY MOUTH DAILY AT 6 PM.   . vitamin B-12 (CYANOCOBALAMIN) 1000 MCG tablet Take 1,000 mcg by mouth  daily.    No facility-administered encounter medications on file as of 09/13/2020.    Wt Readings from Last 3 Encounters:  08/30/20 167 lb (75.8 kg)  07/07/20 168 lb 9.6 oz (76.5 kg)  05/04/20 167 lb (75.8 kg)    Current Diagnosis/Assessment:    Goals Addressed   None     Diabetes   A1c goal <7%  Follows w/ [facility] Endocrinology  Recent Relevant Labs: Lab Results  Component Value Date/Time   HGBA1C 7.0 (H) 07/07/2020 10:36 AM   HGBA1C 8.2 12/31/2019 12:00 AM   HGBA1C 8.8% 09/24/2019 12:00 AM   MICROALBUR 30 (H) 03/17/2020 10:39 AM   MICROALBUR 10 12/17/2019 09:37 AM    A1C at endo 7.6 Last diabetic Eye exam:  Lab Results  Component Value Date/Time   HMDIABEYEEXA No Retinopathy 02/26/2020 12:00 AM   HMDIABEYEEXA No Retinopathy 02/26/2020 12:00 AM    Last diabetic Foot exam:  Lab Results  Component Value Date/Time   HMDIABFOOTEX Normal 05/07/2018 12:00 AM     Checking BG: > 6 times daily  Recent FBG Readings: 120s-170s Recent pre-meal BG readings: 100 Recent 2hr PP BG readings:  200s Recent HS BG readings: 130   Patient has failed these meds in past: Trulicity- weakness, metformin --GI effects at increased dos Patient is currently controlled on the following medications: Marland Kitchen Metformin 500 mg bid  . Basaglar 20 unit qpm . Novolog 18 units with meals + SS  We discussed: Uses Freestyle Libre. Reports an overnight low in  60s. Mrs. Christiana reports not eating any protein with dinner that evening.  We discussed the importance of regular meals containing a protein, a starch and vegetables.   Plan  Continue current medications  Hypertension/CKD   BP goal is:  <130/80  Followed by Dr. Orene Desanctis  Office blood pressures are  BP Readings from Last 3 Encounters:  07/07/20 136/77  05/04/20 130/65  04/13/20 134/73   BMP Latest Ref Rng & Units 07/07/2020 03/17/2020 12/17/2019  Glucose 65 - 99 mg/dL 185(H) 189(H) 188(H)  BUN 8 - 27 mg/dL 20 18 18   Creatinine 0.57 - 1.00 mg/dL 1.05(H) 0.91 0.95  BUN/Creat Ratio 12 - 28 19 20 19   Sodium 134 - 144 mmol/L 140 141 139  Potassium 3.5 - 5.2 mmol/L 4.4 4.5 4.2  Chloride 96 - 106 mmol/L 103 104 102  CO2 20 - 29 mmol/L 23 22 23   Calcium 8.7 - 10.3 mg/dL 9.6 9.7 9.7    Patient checks BP at home Not checking  Patient has failed these meds in the past: NA Patient is currently controlled on the following medications:  . Amlodipine 2.5 mg daily . HCTZ 12.5 mg daily . Losartan 100 mg daily  We discussed Checking blood pressure ~1-2  times weekly. Make sure you are sitting down for at least 5 minutes before,  resting calmly, with your feet flat on the floor. Write down these readings to review at future appointments. Low sodium diet.    Plan  Continue current medications   Rheumatoid Arthritis   Patient has failed these meds in past: Methotrexate Patient is currently controlled on the following medications:  . Remicade injections 372m 08/13/20  We discussed:  Patient has discontinued methotrexate. Getting infusions every four weeks working up to maintenance ot every 8 weeks. Next infusion 11/02/20 per patient. Tolerating well.  Plan  Continue current medications.    Depression / Anxiety   PHQ9 Score:  PHQ9 SCORE ONLY 08/30/2020 07/07/2020 03/17/2020  PHQ-9 Total Score  0 0 0   GAD7 Score: GAD 7 : Generalized Anxiety Score 03/17/2020 10/14/2019 12/19/2017  Nervous, Anxious, on Edge 0 3 3  Control/stop worrying 0 3 2  Worry too much - different things 0 3 3  Trouble relaxing 0 3 3  Restless 0 0 3  Easily annoyed or irritable 0 3 3  Afraid - awful might happen 0 3 2  Total GAD 7 Score 0 18 19  Anxiety Difficulty Not difficult at all Somewhat difficult Very difficult    Patient is currently controlled on the following medications:   Sertraline 120m qd    Plan  Continue current medications. Will discuss at next visit. GERD   Patient has failed these meds in past: NA Patient is currently controlled on the following medications:  . Pantoprazole40 mg qd    Plan  Continue current medications. Will discuss next visit.  Hyperlipidemia   LDL goal < 70  Lipid Panel     Component Value Date/Time   CHOL 164 07/07/2020 1402   CHOL 170 05/08/2019 0939   TRIG 199 (H) 07/07/2020 1402   TRIG 367 (H) 05/08/2019 0939   HDL 56 07/07/2020 1402   LDLCALC 75 07/07/2020 1402    Hepatic Function Latest Ref Rng & Units 12/17/2019 05/08/2019 08/28/2018  Total Protein 6.0 - 8.5 g/dL 6.4 - 6.1  Albumin 3.7 - 4.7 g/dL 4.7 - 4.2  AST 0 - 40 IU/L 28 26 19   ALT 0 - 32 IU/L 26 29 29   Alk  Phosphatase 39 - 117 IU/L 150(H) - 79  Total Bilirubin 0.0 - 1.2 mg/dL 0.3 - 0.3     The 10-year ASCVD risk score (Mikey BussingDC Jr., et al., 2013) is: 38%   Values used to calculate the score:     Age: [age]years     Sex: Female     Is Non-Hispanic African American: No     Diabetic: Yes     Tobacco smoker: No     Systolic Blood Pressure: 1179mmHg     Is BP treated: Yes     HDL Cholesterol: 56 mg/dL     Total Cholesterol: 164 mg/dL   Patient has failed these meds in past: NA Patient is currently query  controlled on the following medications:  . Simvastatin 40 mg qd   Plan  Continue current medications. Will Discuss at next visit  CAD   Patient has failed these meds in past: NA Patient is currently controlled on the following medications:  . Aspirin 81 mg qd . Plavix 75 mg qd  Plan  Continue current medications. Will discuss at next visit   Medication Management   [facility]  Plan  Will discuss at next visit    Follow up: 3  month phone visit  JJunita Push HKenton KingfisherPharmD, [facility] [phone]

## 2020-09-15 ENCOUNTER — Telehealth: Payer: Self-pay

## 2020-09-15 ENCOUNTER — Other Ambulatory Visit: Payer: Self-pay

## 2020-09-15 MED ORDER — PANTOPRAZOLE SODIUM 40 MG PO TBEC
40.0000 mg | DELAYED_RELEASE_TABLET | Freq: Every day | ORAL | 4 refills | Status: DC
Start: 2020-09-15 — End: 2020-10-13

## 2020-09-15 NOTE — Telephone Encounter (Signed)
Pt refills for pantoprazole (PROTONIX) 40 MG tablet  Never went through to pharmacy when sent/ please resend

## 2020-09-15 NOTE — Telephone Encounter (Signed)
Note   KP

## 2020-09-15 NOTE — Telephone Encounter (Signed)
Called pt could not leave VM. Want to tell pt that her medication has been sent in today 09/15/2020.  Will route result note to [facility] Nurse Triage for follow up when patient returns call to clinic. CRM created.  KP

## 2020-09-15 NOTE — Telephone Encounter (Signed)
RF [facility] [place].  [place]

## 2020-09-15 NOTE — Telephone Encounter (Signed)
Able to reach pt, message read, verbalizes understanding that medication was sent to pharmacy.

## 2020-09-24 ENCOUNTER — Other Ambulatory Visit: Payer: Self-pay | Admitting: Cardiovascular Disease

## 2020-10-01 ENCOUNTER — Other Ambulatory Visit: Payer: Self-pay

## 2020-10-01 DIAGNOSIS — I2510 Atherosclerotic heart disease of native coronary artery without angina pectoris: Secondary | ICD-10-CM

## 2020-10-01 DIAGNOSIS — E782 Mixed hyperlipidemia: Secondary | ICD-10-CM

## 2020-10-01 MED ORDER — SIMVASTATIN 40 MG PO TABS: 40.0000 mg | ORAL_TABLET | Freq: Every day | ORAL | 4 refills | Status: DC

## 2020-10-07 ENCOUNTER — Other Ambulatory Visit: Payer: Self-pay

## 2020-10-07 ENCOUNTER — Ambulatory Visit (INDEPENDENT_AMBULATORY_CARE_PROVIDER_SITE_OTHER): Payer: Medicare Other | Admitting: Cardiovascular Disease

## 2020-10-07 ENCOUNTER — Encounter: Payer: Self-pay | Admitting: Cardiovascular Disease

## 2020-10-07 VITALS — BP 132/74 | HR 68 | Ht 65.0 in | Wt 167.0 lb

## 2020-10-07 DIAGNOSIS — I2510 Atherosclerotic heart disease of native coronary artery without angina pectoris: Secondary | ICD-10-CM

## 2020-10-07 DIAGNOSIS — I10 Essential (primary) hypertension: Secondary | ICD-10-CM

## 2020-10-07 DIAGNOSIS — I25118 Atherosclerotic heart disease of native coronary artery with other forms of angina pectoris: Secondary | ICD-10-CM

## 2020-10-07 DIAGNOSIS — E1151 Type 2 diabetes mellitus with diabetic peripheral angiopathy without gangrene: Secondary | ICD-10-CM

## 2020-10-07 DIAGNOSIS — Z8679 Personal history of other diseases of the circulatory system: Secondary | ICD-10-CM | POA: Diagnosis not present

## 2020-10-07 DIAGNOSIS — E782 Mixed hyperlipidemia: Secondary | ICD-10-CM | POA: Diagnosis not present

## 2020-10-07 DIAGNOSIS — I771 Stricture of artery: Secondary | ICD-10-CM

## 2020-10-07 MED ORDER — ATORVASTATIN CALCIUM 40 MG PO TABS: 40.0000 mg | ORAL_TABLET | Freq: Every day | ORAL | 3 refills | Status: DC

## 2020-10-07 NOTE — Progress Notes (Signed)
Patient ID: Laura Rojas, female   DOB: 04/23/1946, 74 y.o.   MRN: [id number]    Cardiology Office Note    Date:  10/07/2020   ID:  Laura Rojas, DOB [birth year], MRN [id number]  PCP:  Venita Lick, NP  Cardiologist:   Sanda Klein, MD   Chief complaint: CAD follow up  History of Present Illness:  Laura Rojas is a 74 y.o. female with coronary disease, PAD (suspected left subclavian stenosis), DM, HTN and hyperlipidemia, polymyalgia rheumatica.  After stopping her beta-blocker she has had improved energy and less complaints of fatigue.  She did not have any problems with angina after stopping the carvedilol.  She has not managed to lose any weight.  She denies leg edema, orthopnea, PND, exertional dyspnea, dizziness or syncope.  She has not had any focal neurological events or bleeding problems.  Her most recent LDL cholesterol was 75 on 07/07/2020.  Her hemoglobin A1c is markedly improved at 7%.  She has normal renal function.  She had stents placed in the proximal LAD and proximal RCA (2007, 3.0 x 28 mm drug-eluting Cypher stent to RCA, 3.5 x 33 mm drug-eluting Cypher stent and proximal LAD). She is known to have an untreated stenosis in the very distal portion of the LAD and a 60% "jailed" ostial diagonal stenosis at the level of the proximal LAD stent. She has preserved LV systolic function and has never had congestive heart failure. Her most recent functional study was a nuclear stress test in 2013 that showed normal perfusion and normal LVEF. Her most recent cardiac catheterization was performed in 2009 and both the LAD and RCA stents were found to be widely patent. Significant comorbidities include diabetes mellitus type 2, hyperlipidemia and hypertension.    Past Medical History:  Diagnosis Date  . Arthritis    Shoulders, hands, feet  . CAD (coronary artery disease) 12/31/2013   2007, 3.0 x 28 mm drug-eluting Cypher stent to RCA, 3.5 x 33 mm drug-eluting Cypher stent and  proximal LAD) Consent by 2009 Normal perfusion by mildly 2013   . Diabetes mellitus without complication ([place])   . Dyslipidemia   . GERD (gastroesophageal reflux disease)   . Hypertension   . Polymyalgia rheumatica syndrome ([place])   . Subclavian artery stenosis, left ([place]) 01/01/2015    Past Surgical History:  Procedure Laterality Date  . BREAST EXCISIONAL BIOPSY Right 1970   EXCISIONAL - NEG  . BREAST EXCISIONAL BIOPSY Left 1973   EXCISIONAL - NEG  . BREAST SURGERY    . CARDIAC CATHETERIZATION  08/25/2008   patent stents  . CAROTID DUPLEX  10/2006   NORMAL CAROTID ARTERY DUPLEX  . CATARACT EXTRACTION W/PHACO Left 04/13/2020   Procedure: CATARACT EXTRACTION PHACO AND INTRAOCULAR LENS PLACEMENT (IOC) LEFT DIABETIC 4.69  00:44.0;  Surgeon: Birder Robson, MD;  Location: [facility];  Service: Ophthalmology;  Laterality: Left;  Diabetic - insulin and oral meds  . CATARACT EXTRACTION W/PHACO Right 05/04/2020   Procedure: CATARACT EXTRACTION PHACO AND INTRAOCULAR LENS PLACEMENT (IOC) RIGHT DIABETIC 3.92  00:27.1;  Surgeon: Birder Robson, MD;  Location: [facility];  Service: Ophthalmology;  Laterality: Right;  Diabetic - insulin and oral meds  . COLONOSCOPY WITH PROPOFOL N/A 08/19/2019   Procedure: COLONOSCOPY WITH PROPOFOL;  Surgeon: Lucilla Lame, MD;  Location[facility] ENDOSCOPY;  Service: Endoscopy;  Laterality: N/A;  . CORONARY ANGIOPLASTY WITH STENT PLACEMENT  09/17/2006   PTCA & stenting LAD  . ESOPHAGOGASTRODUODENOSCOPY N/A 04/27/2015  Procedure: ESOPHAGOGASTRODUODENOSCOPY (EGD);  Surgeon: Lucilla Lame, MD;  Location: [facility] ENDOSCOPY;  Service: Endoscopy;  Laterality: N/A;  . JOINT REPLACEMENT    . NM MYOCAR MULTIPLE W/SPECT  11/2011   EF 72%.NORMAL MYOCARDIAL PERFUSION STUDY  . REPLACEMENT TOTAL KNEE  2003 & 2004  . TRANSTHORACIC ECHOCARDIOGRAM  09/2006   EF =>55%. IMPAIRED LV RELAXATION. MILD MITRAL ANNULAR CALCIFICATION. AV-MILDLY SCLEROTIC. NO AS.  Marland Kitchen VAGINAL  HYSTERECTOMY  1983    Current Medications: Outpatient Medications Prior to Visit  Medication Sig Dispense Refill  . amLODipine (NORVASC) 2.5 MG tablet TAKE 1 TABLET BY MOUTH EVERY DAY 90 tablet 3  . aspirin 81 MG tablet Take 81 mg by mouth daily.    . cholecalciferol (VITAMIN D3) 25 MCG (1000 UT) tablet Take 1,000 Units by mouth daily.    . clopidogrel (PLAVIX) 75 MG tablet TAKE 1 TABLET BY MOUTH EVERY DAY 90 tablet 4  . Continuous Blood Gluc Sensor (FREESTYLE LIBRE 14 DAY SENSOR) MISC Use 1 kit every 14 (fourteen) days T36.46    . folic acid (FOLVITE) 1 MG tablet Take 1 mg by mouth daily. (Patient not taking: Reported on 08/23/2020)    . hydrochlorothiazide (MICROZIDE) 12.5 MG capsule Take 1 capsule (12.5 mg total) by mouth daily. 90 capsule 3  . inFLIXimab (REMICADE) 100 MG injection Inject 300 mg into the vein. For RA.Called patient. Advised patient of provider's approval for requested procedure, as well as any comments/instructions from provider.   Provided patient w/ verbal instructions concerning pre-, intra- and post-procedure preparation and instructions. Does not know dose. Next inusion 11/02/20 then q8 weeks    . Insulin Glargine (BASAGLAR KWIKPEN) 100 UNIT/ML SOPN Inject 20 Units into the skin at bedtime.     Marland Kitchen LORazepam (ATIVAN) 0.5 MG tablet TAKE 1 TAB BY MOUTH TWICE A DAY (USE THE ADDITIONAL 30 TABS ONLY SPARINGLY & ONLY FOR SEVERE ANXIETY) (Patient taking differently: 0.5 mg. TAKE 1 TAB BY MOUTH TWICE A DAY (USE THE ADDITIONAL 30 TABS ONLY SPARINGLY & ONLY FOR SEVERE ANXIETY) Taking at bedtime) 180 tablet 0  . losartan (COZAAR) 100 MG tablet Take 1 tablet (100 mg total) by mouth daily. 90 tablet 4  . Magnesium 400 MG TABS Take 1 tablet by mouth daily.    . metFORMIN (GLUCOPHAGE) 500 MG tablet Take 500 mg by mouth 2 (two) times daily.     . methotrexate 50 MG/2ML injection Inject 25 mg into the skin once a week.  (Patient not taking: Reported on 07/07/2020)    . Multiple Vitamin  (MULTIVITAMIN) tablet Take 1 tablet by mouth daily.    Marland Kitchen NOVOLOG 100 UNIT/ML injection Inject 10 Units into the skin 3 (three) times daily.  3  . Omega-3 Fatty Acids (FISH OIL) 1200 MG CAPS Take 1,200 mg by mouth daily.    . pantoprazole (PROTONIX) 40 MG tablet Take 1 tablet (40 mg total) by mouth daily. 90 tablet 4  . sertraline (ZOLOFT) 100 MG tablet Take 1.5 tablets (150 mg total) by mouth daily. 135 tablet 1  . vitamin B-12 (CYANOCOBALAMIN) 1000 MCG tablet Take 1,000 mcg by mouth daily.    . simvastatin (ZOCOR) 40 MG tablet Take 1 tablet (40 mg total) by mouth daily at 6 PM. 90 tablet 4   No facility-administered medications prior to visit.     Allergies:   Compazine [prochlorperazine edisylate], Altace [ramipril], Crestor [rosuvastatin calcium], Reglan [metoclopramide], Sulfa antibiotics, Actos [pioglitazone], Amlodipine, Latex, and Lipitor [atorvastatin]   Social History   Socioeconomic  History  . Marital status: Married    Spouse name: Not on file  . Number of children: Not on file  . Years of education: Not on file  . Highest education level: Some college, no degree  Occupational History  . Not on file  Tobacco Use  . Smoking status: Never Smoker  . Smokeless tobacco: Never Used  Vaping Use  . Vaping Use: Never used  Substance and Sexual Activity  . Alcohol use: No    Alcohol/week: 0.0 standard drinks  . Drug use: Never  . Sexual activity: Not on file  Other Topics Concern  . Not on file  Social History Narrative  . Not on file   Social Determinants of Health   Financial Resource Strain: Low Risk   . Difficulty of Paying Living Expenses: Not hard at all  Food Insecurity: No Food Insecurity  . Worried About Charity fundraiser in the Last Year: Never true  . Ran Out of Food in the Last Year: Never true  Transportation Needs: No Transportation Needs  . Lack of Transportation (Medical): No  . Lack of Transportation (Non-Medical): No  Physical Activity: Inactive    . Days of Exercise per Week: 0 days  . Minutes of Exercise per Session: 0 min  Stress: No Stress Concern Present  . Feeling of Stress : Not at all  Social Connections:   . Frequency of Communication with Friends and Family: Not on file  . Frequency of Social Gatherings with Friends and Family: Not on file  . Attends Religious Services: Not on file  . Active Member of Clubs or Organizations: Not on file  . Attends Archivist Meetings: Not on file  . Marital Status: Not on file     Family History:  The patient's family history includes Brain cancer in her grandchild; Breast cancer (age of onset: 79) in her mother; Diabetes in her sister; Heart disease in her maternal grandfather and maternal grandmother.   ROS:   Please see the history of present illness.    ROS all other systems are reviewed and are negative  PHYSICAL EXAM:   VS:  BP 132/74 (BP Location: Right Arm, Patient Position: Sitting)   Pulse 68   Ht _0  (1.651 m)   Wt 167 lb (75.8 kg)   SpO2 98%   BMI 27.79 kg/m     General: Alert, oriented x3, no distress, overweight Head: no evidence of trauma, PERRL, EOMI, no exophtalmos or lid lag, no myxedema, no xanthelasma; normal ears, nose and oropharynx Neck: normal jugular venous pulsations and no hepatojugular reflux; brisk carotid pulses without delay and no carotid bruits Chest: clear to auscultation, no signs of consolidation by percussion or palpation, normal fremitus, symmetrical and full respiratory excursions Cardiovascular: normal position and quality of the apical impulse, regular rhythm, normal first and second heart sounds, no murmurs, rubs or gallops Abdomen: no tenderness or distention, no masses by palpation, no abnormal pulsatility or arterial bruits, normal bowel sounds, no hepatosplenomegaly Extremities: no clubbing, cyanosis or edema; 2+ radial, ulnar and brachial pulses bilaterally; 2+ right femoral, posterior tibial and dorsalis pedis pulses; 2+  left femoral, posterior tibial and dorsalis pedis pulses; no subclavian or femoral bruits Neurological: grossly nonfocal Psych: Normal mood and affect   Wt Readings from Last 3 Encounters:  10/07/20 167 lb (75.8 kg)  08/30/20 167 lb (75.8 kg)  07/07/20 168 lb 9.6 oz (76.5 kg)      Studies/Labs Reviewed:   EKG:  EKG is ordered today.  It shows normal sinus rhythm and minor nonspecific repolarization changes.  QTc 431 ms  Recent Labs: 12/17/2019: ALT 26; Hemoglobin 12.6; Magnesium 2.0; Platelets 262 07/07/2020: BUN 20; Creatinine, Ser 1.05; Potassium 4.4; Sodium 140; TSH 2.660   Lipid Panel    Component Value Date/Time   CHOL 164 07/07/2020 1402   CHOL 170 05/08/2019 0939   TRIG 199 (H) 07/07/2020 1402   TRIG 367 (H) 05/08/2019 0939   HDL 56 07/07/2020 1402   CHOLHDL 2.9 08/28/2018 1054   VLDL 73 (H) 05/08/2019 0939   LDLCALC 75 07/07/2020 1402    ASSESSMENT:    1. Coronary artery disease of native artery of native heart with stable angina pectoris ([place])   2. Essential hypertension   3. History of sinus bradycardia   4. Diabetes mellitus with peripheral vascular disease ([place])   5. Mixed hyperlipidemia   6. Stenosis of left subclavian artery ([facility])      PLAN:  In order of problems listed above:  1. CAD: She remains active and does not have angina pectoris even after stopping her beta-blocker.  A low-dose of amlodipine is now her only antianginal medication.  She is on clopidogrel chronically.  Risk factors are generally well addressed. 2. HTN: Adequate control. 3. SSS: Improved symptoms of chronotropic incompetence after stopping the beta-blocker. 4. DM: Markedly improved glycemic control with improved (albeit not perfect) reduction in triglycerides as well. 5. HLP: LDL remains a little too high (target less than 70).  I am also concerned about the amlodipine-simvastatin interaction.  We will switch to atorvastatin.  Reportedly had some muscle cramps with this in the past  and we will try again.  Also had side effects with rosuvastatin. 6. PAD: Denies any symptoms of claudication either in upper or lower extremities.  Has excellent pulses in her lower extremities..  There was no evidence of significant carotid or subclavian stenosis on duplex ultrasonography performed in 2016 and there was antegrade flow in the left vertebral artery. Her blood pressure should always be checked on the right side (roughly 15 mmHg gradient compared to left).     Medication Adjustments/Labs and Tests Ordered: Current medicines are reviewed at length with the patient today.  Concerns regarding medicines are outlined above.  Medication changes, Labs and Tests ordered today are listed in the Patient Instructions below. Patient Instructions  Medication Instructions:  STOP the Simvastatin START Atorvastatin 40 mg once daily  *If you need a refill on your cardiac medications before your next appointment, please call your pharmacy*   Lab Work: None ordered If you have labs (blood work) drawn today and your tests are completely normal, you will receive your results only by: Marland Kitchen MyChart Message (if you have MyChart) OR . A paper copy in the mail If you have any lab test that is abnormal or we need to change your treatment, we will call you to review the results.   Testing/Procedures: None ordered   Follow-Up: At [facility], you and your health needs are our priority.  As part of our continuing mission to provide you with exceptional heart care, we have created designated Provider Care Teams.  These Care Teams include your primary Cardiologist (physician) and Advanced Practice Providers (APPs -  Physician Assistants and Nurse Practitioners) who all work together to provide you with the care you need, when you need it.  We recommend signing up for the patient portal called "MyChart".  Sign up information is provided on this  After Visit Summary.  MyChart is used to connect with  patients for Virtual Visits (Telemedicine).  Patients are able to view lab/test results, encounter notes, upcoming appointments, etc.  Non-urgent messages can be sent to your provider as well.   To learn more about what you can do with MyChart, go to [email or web address].    Your next appointment:   12 month(s)  The format for your next appointment:   In Person  Provider:   You may see Sanda Klein, MD or one of the following Advanced Practice Providers on your designated Care Team:    Almyra Deforest, PA-C  Fabian Sharp, [place] or   Roby Lofts, PA-C       Signed, Sanda Klein, MD  10/07/2020 [facility] [place], [place], [place]  [address] Phone: [phone]; Fax: [phone]

## 2020-10-07 NOTE — Patient Instructions (Signed)
Medication Instructions:  STOP the Simvastatin START Atorvastatin 40 mg once daily  *If you need a refill on your cardiac medications before your next appointment, please call your pharmacy*   Lab Work: None ordered If you have labs (blood work) drawn today and your tests are completely normal, you will receive your results only by: Marland Kitchen MyChart Message (if you have MyChart) OR . A paper copy in the mail If you have any lab test that is abnormal or we need to change your treatment, we will call you to review the results.   Testing/Procedures: None ordered   Follow-Up: At [facility], you and your health needs are our priority.  As part of our continuing mission to provide you with exceptional heart care, we have created designated Provider Care Teams.  These Care Teams include your primary Cardiologist (physician) and Advanced Practice Providers (APPs -  Physician Assistants and Nurse Practitioners) who all work together to provide you with the care you need, when you need it.  We recommend signing up for the patient portal called "MyChart".  Sign up information is provided on this After Visit Summary.  MyChart is used to connect with patients for Virtual Visits (Telemedicine).  Patients are able to view lab/test results, encounter notes, upcoming appointments, etc.  Non-urgent messages can be sent to your provider as well.   To learn more about what you can do with MyChart, go to [email or web address].    Your next appointment:   12 month(s)  The format for your next appointment:   In Person  Provider:   You may see Sanda Klein, MD or one of the following Advanced Practice Providers on your designated Care Team:    Almyra Deforest, PA-C  Fabian Sharp, PA-C or   Roby Lofts, [place]

## 2020-10-13 ENCOUNTER — Other Ambulatory Visit: Payer: Self-pay

## 2020-10-13 ENCOUNTER — Encounter: Payer: Self-pay | Admitting: Nurse Practitioner

## 2020-10-13 ENCOUNTER — Ambulatory Visit (INDEPENDENT_AMBULATORY_CARE_PROVIDER_SITE_OTHER): Payer: Medicare Other | Admitting: Nurse Practitioner

## 2020-10-13 VITALS — BP 138/80 | HR 64 | Temp 97.6°F | Wt 166.8 lb

## 2020-10-13 DIAGNOSIS — E1159 Type 2 diabetes mellitus with other circulatory complications: Secondary | ICD-10-CM | POA: Diagnosis not present

## 2020-10-13 DIAGNOSIS — E785 Hyperlipidemia, unspecified: Secondary | ICD-10-CM | POA: Diagnosis not present

## 2020-10-13 DIAGNOSIS — Z794 Long term (current) use of insulin: Secondary | ICD-10-CM | POA: Diagnosis not present

## 2020-10-13 DIAGNOSIS — E1165 Type 2 diabetes mellitus with hyperglycemia: Secondary | ICD-10-CM | POA: Diagnosis not present

## 2020-10-13 DIAGNOSIS — F413 Other mixed anxiety disorders: Secondary | ICD-10-CM

## 2020-10-13 DIAGNOSIS — E1169 Type 2 diabetes mellitus with other specified complication: Secondary | ICD-10-CM | POA: Diagnosis not present

## 2020-10-13 DIAGNOSIS — I25708 Atherosclerosis of coronary artery bypass graft(s), unspecified, with other forms of angina pectoris: Secondary | ICD-10-CM | POA: Diagnosis not present

## 2020-10-13 DIAGNOSIS — E1122 Type 2 diabetes mellitus with diabetic chronic kidney disease: Secondary | ICD-10-CM

## 2020-10-13 DIAGNOSIS — I152 Hypertension secondary to endocrine disorders: Secondary | ICD-10-CM | POA: Diagnosis not present

## 2020-10-13 DIAGNOSIS — M353 Polymyalgia rheumatica: Secondary | ICD-10-CM | POA: Diagnosis not present

## 2020-10-13 DIAGNOSIS — N183 Chronic kidney disease, stage 3 unspecified: Secondary | ICD-10-CM | POA: Diagnosis not present

## 2020-10-13 DIAGNOSIS — F339 Major depressive disorder, recurrent, unspecified: Secondary | ICD-10-CM | POA: Diagnosis not present

## 2020-10-13 DIAGNOSIS — I2510 Atherosclerotic heart disease of native coronary artery without angina pectoris: Secondary | ICD-10-CM

## 2020-10-13 DIAGNOSIS — I771 Stricture of artery: Secondary | ICD-10-CM | POA: Diagnosis not present

## 2020-10-13 LAB — BAYER DCA HB A1C WAIVED: HB A1C (BAYER DCA - WAIVED): 8 % — ABNORMAL HIGH (ref <7.0)

## 2020-10-13 MED ORDER — SERTRALINE HCL 100 MG PO TABS
150.0000 mg | ORAL_TABLET | Freq: Every day | ORAL | 4 refills | Status: DC
Start: 2020-10-13 — End: 2021-10-24

## 2020-10-13 MED ORDER — LORAZEPAM 0.5 MG PO TABS
ORAL_TABLET | ORAL | 0 refills | Status: DC
Start: 2020-10-13 — End: 2021-01-11

## 2020-10-13 NOTE — Assessment & Plan Note (Signed)
Chronic, ongoing, followed by endocrinology with A1C today 8%.  Continue this collaboration and current medication regimen, defer changes to endo at this time, + collaboration with CCM team in office.  Would benefit from trial of SGLT in future (especially with cardiac health), will discuss with endocrinology.  Bring Libre to next visit.  Return to office in 3 months.

## 2020-10-13 NOTE — Assessment & Plan Note (Addendum)
Chronic, ongoing.  Continue current medication regimen and adjust as needed.  Lipid panel today.  Discussed with patient, if myalgias with Atorvastatin, + her history of myalgia with statins, could consider Repatha initiation if poor tolerance to new regimen.

## 2020-10-13 NOTE — Assessment & Plan Note (Signed)
Chronic, ongoing.  Denies SI/HI.  Continue Sertraline  150 MG daily as is ordered, which she agrees to do. This could benefit mood overall and help reduce benzo use.  Consider increase to 200 MG in future for increased benefit.  Return in 3 months. 

## 2020-10-13 NOTE — Assessment & Plan Note (Signed)
Continue collaboration with cardiology and preventative treatment.

## 2020-10-13 NOTE — Assessment & Plan Note (Signed)
Chronic, ongoing.  Continue collaboration with rheumatology and current medication regimen.

## 2020-10-13 NOTE — Assessment & Plan Note (Signed)
Chronic, stable with BP at goal in office today on recheck.  Continue current medication regimen and collaboration with cardiology. BMP today.  Recommend she monitor BP at least three days a week at home and document for providers + bring to visits.  DASH diet focus.  Return in 3 months.

## 2020-10-13 NOTE — Assessment & Plan Note (Signed)
Stable, continue collaboration with cardiology and preventative treatment ASA, Plavix, and statin.

## 2020-10-13 NOTE — Patient Instructions (Signed)
DASH Eating Plan DASH stands for "Dietary Approaches to Stop Hypertension." The DASH eating plan is a healthy eating plan that has been shown to reduce high blood pressure (hypertension). It may also reduce your risk for type 2 diabetes, heart disease, and stroke. The DASH eating plan may also help with weight loss. What are tips for following this plan?  General guidelines  Avoid eating more than 2,300 mg (milligrams) of salt (sodium) a day. If you have hypertension, you may need to reduce your sodium intake to 1,500 mg a day.  Limit alcohol intake to no more than 1 drink a day for nonpregnant women and 2 drinks a day for men. One drink equals 12 oz of beer, 5 oz of wine, or 1 oz of hard liquor.  Work with your health care provider to maintain a healthy body weight or to lose weight. Ask what an ideal weight is for you.  Get at least 30 minutes of exercise that causes your heart to beat faster (aerobic exercise) most days of the week. Activities may include walking, swimming, or biking.  Work with your health care provider or diet and nutrition specialist (dietitian) to adjust your eating plan to your individual calorie needs. Reading food labels   Check food labels for the amount of sodium per serving. Choose foods with less than 5 percent of the Daily Value of sodium. Generally, foods with less than 300 mg of sodium per serving fit into this eating plan.  To find whole grains, look for the word "whole" as the first word in the ingredient list. Shopping  Buy products labeled as "low-sodium" or "no salt added."  Buy fresh foods. Avoid canned foods and premade or frozen meals. Cooking  Avoid adding salt when cooking. Use salt-free seasonings or herbs instead of table salt or sea salt. Check with your health care provider or pharmacist before using salt substitutes.  Do not fry foods. Cook foods using healthy methods such as baking, boiling, grilling, and broiling instead.  Cook with  heart-healthy oils, such as olive, canola, soybean, or sunflower oil. Meal planning  Eat a balanced diet that includes: ? 5 or more servings of fruits and vegetables each day. At each meal, try to fill half of your plate with fruits and vegetables. ? Up to 6-8 servings of whole grains each day. ? Less than 6 oz of lean meat, poultry, or fish each day. A 3-oz serving of meat is about the same size as a deck of cards. One egg equals 1 oz. ? 2 servings of low-fat dairy each day. ? A serving of nuts, seeds, or beans 5 times each week. ? Heart-healthy fats. Healthy fats called Omega-3 fatty acids are found in foods such as flaxseeds and coldwater fish, like sardines, salmon, and mackerel.  Limit how much you eat of the following: ? Canned or prepackaged foods. ? Food that is high in trans fat, such as fried foods. ? Food that is high in saturated fat, such as fatty meat. ? Sweets, desserts, sugary drinks, and other foods with added sugar. ? Full-fat dairy products.  Do not salt foods before eating.  Try to eat at least 2 vegetarian meals each week.  Eat more home-cooked food and less restaurant, buffet, and fast food.  When eating at a restaurant, ask that your food be prepared with less salt or no salt, if possible. What foods are recommended? The items listed may not be a complete list. Talk with your dietitian about   what dietary choices are best for you. Grains Whole-grain or whole-wheat bread. Whole-grain or whole-wheat pasta. Brown rice. Oatmeal. Quinoa. Bulgur. Whole-grain and low-sodium cereals. Pita bread. Low-fat, low-sodium crackers. Whole-wheat flour tortillas. Vegetables Fresh or frozen vegetables (raw, steamed, roasted, or grilled). Low-sodium or reduced-sodium tomato and vegetable juice. Low-sodium or reduced-sodium tomato sauce and tomato paste. Low-sodium or reduced-sodium canned vegetables. Fruits All fresh, dried, or frozen fruit. Canned fruit in natural juice (without  added sugar). Meat and other protein foods Skinless chicken or turkey. Ground chicken or turkey. Pork with fat trimmed off. Fish and seafood. Egg whites. Dried beans, peas, or lentils. Unsalted nuts, nut butters, and seeds. Unsalted canned beans. Lean cuts of beef with fat trimmed off. Low-sodium, lean deli meat. Dairy Low-fat (1%) or fat-free (skim) milk. Fat-free, low-fat, or reduced-fat cheeses. Nonfat, low-sodium ricotta or cottage cheese. Low-fat or nonfat yogurt. Low-fat, low-sodium cheese. Fats and oils Soft margarine without trans fats. Vegetable oil. Low-fat, reduced-fat, or light mayonnaise and salad dressings (reduced-sodium). Canola, safflower, olive, soybean, and sunflower oils. Avocado. Seasoning and other foods Herbs. Spices. Seasoning mixes without salt. Unsalted popcorn and pretzels. Fat-free sweets. What foods are not recommended? The items listed may not be a complete list. Talk with your dietitian about what dietary choices are best for you. Grains Baked goods made with fat, such as croissants, muffins, or some breads. Dry pasta or rice meal packs. Vegetables Creamed or fried vegetables. Vegetables in a cheese sauce. Regular canned vegetables (not low-sodium or reduced-sodium). Regular canned tomato sauce and paste (not low-sodium or reduced-sodium). Regular tomato and vegetable juice (not low-sodium or reduced-sodium). Pickles. Olives. Fruits Canned fruit in a light or heavy syrup. Fried fruit. Fruit in cream or butter sauce. Meat and other protein foods Fatty cuts of meat. Ribs. Fried meat. Bacon. Sausage. Bologna and other processed lunch meats. Salami. Fatback. Hotdogs. Bratwurst. Salted nuts and seeds. Canned beans with added salt. Canned or smoked fish. Whole eggs or egg yolks. Chicken or turkey with skin. Dairy Whole or 2% milk, cream, and half-and-half. Whole or full-fat cream cheese. Whole-fat or sweetened yogurt. Full-fat cheese. Nondairy creamers. Whipped toppings.  Processed cheese and cheese spreads. Fats and oils Butter. Stick margarine. Lard. Shortening. Ghee. Bacon fat. Tropical oils, such as coconut, palm kernel, or palm oil. Seasoning and other foods Salted popcorn and pretzels. Onion salt, garlic salt, seasoned salt, table salt, and sea salt. Worcestershire sauce. Tartar sauce. Barbecue sauce. Teriyaki sauce. Soy sauce, including reduced-sodium. Steak sauce. Canned and packaged gravies. Fish sauce. Oyster sauce. Cocktail sauce. Horseradish that you find on the shelf. Ketchup. Mustard. Meat flavorings and tenderizers. Bouillon cubes. Hot sauce and Tabasco sauce. Premade or packaged marinades. Premade or packaged taco seasonings. Relishes. Regular salad dressings. Where to find more information:  National Heart, Lung, and Blood Institute: www.nhlbi.nih.gov  American Heart Association: www.heart.org Summary  The DASH eating plan is a healthy eating plan that has been shown to reduce high blood pressure (hypertension). It may also reduce your risk for type 2 diabetes, heart disease, and stroke.  With the DASH eating plan, you should limit salt (sodium) intake to 2,300 mg a day. If you have hypertension, you may need to reduce your sodium intake to 1,500 mg a day.  When on the DASH eating plan, aim to eat more fresh fruits and vegetables, whole grains, lean proteins, low-fat dairy, and heart-healthy fats.  Work with your health care provider or diet and nutrition specialist (dietitian) to adjust your eating plan to your   individual calorie needs. This information is not intended to replace advice given to you by your health care provider. Make sure you discuss any questions you have with your health care provider. Document Revised: 10/26/2017 Document Reviewed: 11/06/2016 Elsevier Patient Education  2020 Elsevier Inc.  

## 2020-10-13 NOTE — Assessment & Plan Note (Signed)
Chronic, stable.  Continue Losartan for kidney protection.  Urine micro in office last visit ALB 30 and A:C <30. Continue collaboration with cardiology and endocrinology.  Consider nephrology referral if worsening function.  BMP today.

## 2020-10-13 NOTE — Progress Notes (Signed)
BP 138/80   Pulse 64   Temp 97.6 F (36.4 C)   Wt 166 lb 12.8 oz (75.7 kg)   SpO2 95%   BMI 27.76 kg/m    Subjective:    Patient ID: Laura Rojas, female    DOB: 1946/04/14, 74 y.o.   MRN: [id number]  HPI: [name] is a 74 y.o. female  Chief Complaint  Patient presents with  . Diabetes  . Hypertension  . mood   DIABETES Followed by endocrinology and last saw [facility] in August 2021 with A1C 7.6%.  She does endorse good/bad diet.  She wears a Libre -- average glucose over past 3 months was 177, was in target 53%/above average 45%/low 2% of the tim. Takes daily B12 supplement due to history of low levels.  She continues on [employer], and Metformin.  Sees endo next Wednesday.   Hypoglycemic episodes: occasional -- 1 a week less than 70 (on Libre review has been low 18 times over past 3 months) Polydipsia/polyuria: no Visual disturbance: no Chest pain: no Paresthesias: no Glucose Monitoring: yes             Accucheck frequency: Daily             Fasting glucose: 170 - 200             Post prandial:             Evening:              Before meals: Taking Insulin?: yes             Long acting insulin: 20 units             Short acting insulin:18 units with meals Blood Pressure Monitoring: rarely Retinal Examination: Up to Date Foot Exam: Up to Date Pneumovax: Up to Date Influenza: Up to Date Aspirin: yes   HYPERTENSION / HYPERLIPIDEMIA/CAD She is followed by cardiology, Dr. Sallyanne Kuster. Last saw 10/07/20 with changes to statin -- changed to Atorvastatin due to concerns for interaction with Simvastatin and Amlodipine. Monitored by cardiology due to subclavian artery stenosis and angina. Continues on Amlodipine, Plavix, HCTZ, Fish oil, Losartan. Starting Atorvastatin today -- has had muscle aches in past with this.   Satisfied with current treatment? yes Duration of hypertension: chronic BP monitoring frequency: not checking BP range:not checking BP  medication side effects: no Duration of hyperlipidemia: chronic Cholesterol medication side effects: no Cholesterol supplements: fish oil Medication compliance: good compliance Aspirin: yes Recent stressors: no Recurrent headaches: no Visual changes: no Palpitations: no Dyspnea: no Chest pain: no Lower extremity edema: no Dizzy/lightheaded: no   CHRONIC KIDNEY DISEASE Last labs GFR 52 and CRT 1.05. CKD status: stable Medications renally dose: yes Previous renal evaluation: no Pneumovax:  Up to Date Influenza Vaccine:  Up to Date   CHRONIC JOINT PAIN On review is followed by rheumatology with diagnosis of polymyalgia rheumatica syndrome with a positive anti-CCP test.  Last seen 09/06/20, having infusions. Duration: chronic Involved joints: right hand, both feet, and left knee Mechanism of injury: arthritis Severity: 7/10 at worst Quality:  dull, aching and burning Frequency: intermittent Radiation: no Aggravating factors: walking and movement  Alleviating factors: Aspercreme and APAP  Status: fluctuating Treatments attempted: Tylenol and Aspercreme Relief with NSAIDs?:  No NSAIDs Taken Weakness with weight bearing or walking: no Sensation of giving way: no Locking: no Popping: no Bruising: no Swelling: no Redness: no Paresthesias/decreased sensation: no Fevers: no  ANXIETY/STRESS Taking  Zoloft 150 MG daily.  Is taking Ativan 0.5 MG, takes this at night only, although ordered BID -- currently reports she takes 1 MG at night. Occasionally will take extra dose at day time, but not often, reports since last visit has had to not take extra as often after out discussion.  Pt aware of risks of benzo medication use to include increased sedation, respiratory suppression, falls, dependence and cardiovascular events.  Pt would like to continue treatment as benefit determined to outweigh risk.  On review PDMP last Ativan fill 07/27/20 for 3 months supply. Duration:stable Anxious  mood: yes  Excessive worrying: no Irritability: no  Sweating: no Nausea: no Palpitations:no Hyperventilation: no Panic attacks: no Agoraphobia: no  Obscessions/compulsions: no Depressed mood: no Depression screen [facility] 2/9 10/13/2020 08/30/2020 07/07/2020 03/17/2020 12/17/2019  Decreased Interest 0 0 0 0 0  Down, Depressed, Hopeless 1 0 0 0 1  PHQ - 2 Score 1 0 0 0 1  Altered sleeping 1 - 0 0 3  Tired, decreased energy 0 - 0 0 1  Change in appetite 1 - 0 0 0  Feeling bad or failure about yourself  0 - 0 0 0  Trouble concentrating 0 - 0 0 0  Moving slowly or fidgety/restless 0 - 0 0 0  Suicidal thoughts 0 - 0 0 0  PHQ-9 Score 3 - 0 0 5  Difficult doing work/chores Not difficult at all - Not difficult at all Not difficult at all -  Some recent data might be hidden  Anhedonia: no Weight changes: no Insomnia: yes hard to fall asleep  Hypersomnia: no Fatigue/loss of energy: no Feelings of worthlessness: no Feelings of guilt: no Impaired concentration/indecisiveness: no Suicidal ideations: no  Crying spells: no Recent Stressors/Life Changes: no   Relationship problems: no   Family stress: no     Financial stress: no    Job stress: no    Recent death/loss: no GAD 7 : Generalized Anxiety Score 10/13/2020 03/17/2020 10/14/2019 12/19/2017  Nervous, Anxious, on Edge 0 0 3 3  Control/stop worrying 0 0 3 2  Worry too much - different things 0 0 3 3  Trouble relaxing 0 0 3 3  Restless 0 0 0 3  Easily annoyed or irritable 0 0 3 3  Afraid - awful might happen 0 0 3 2  Total GAD 7 Score 0 0 18 19  Anxiety Difficulty - Not difficult at all Somewhat difficult Very difficult    Relevant past medical, surgical, family and social history reviewed and updated as indicated. Interim medical history since our last visit reviewed. Allergies and medications reviewed and updated.  Review of Systems  Constitutional: Negative for activity change, appetite change, diaphoresis, fatigue and fever.   Respiratory: Negative for cough, chest tightness and shortness of breath.   Cardiovascular: Negative for chest pain, palpitations and leg swelling.  Gastrointestinal: Negative.   Endocrine: Negative for polydipsia, polyphagia and polyuria.  Neurological: Negative.   Psychiatric/Behavioral: Negative.     Per HPI unless specifically indicated above     Objective:    BP 138/80   Pulse 64   Temp 97.6 F (36.4 C)   Wt 166 lb 12.8 oz (75.7 kg)   SpO2 95%   BMI 27.76 kg/m   Wt Readings from Last 3 Encounters:  10/13/20 166 lb 12.8 oz (75.7 kg)  10/07/20 167 lb (75.8 kg)  08/30/20 167 lb (75.8 kg)    Physical Exam Vitals and nursing note reviewed.  Constitutional:  General: She is awake. She is not in acute distress.    Appearance: She is well-developed, well-groomed and overweight. She is not ill-appearing.  HENT:     Head: Normocephalic.     Right Ear: Hearing normal.     Left Ear: Hearing normal.  Eyes:     General: Lids are normal.        Right eye: No discharge.        Left eye: No discharge.     Conjunctiva/sclera: Conjunctivae normal.     Pupils: Pupils are equal, round, and reactive to light.  Neck:     Thyroid: No thyromegaly.     Vascular: Carotid bruit (bilateral) present.  Cardiovascular:     Rate and Rhythm: Normal rate and regular rhythm.     Heart sounds: Normal heart sounds. No murmur heard.  No gallop.   Pulmonary:     Effort: Pulmonary effort is normal. No accessory muscle usage or respiratory distress.     Breath sounds: Normal breath sounds.  Abdominal:     General: Bowel sounds are normal.     Palpations: Abdomen is soft.  Musculoskeletal:     Cervical back: Normal range of motion and neck supple.     Right lower leg: No edema.     Left lower leg: No edema.  Skin:    General: Skin is warm and dry.  Neurological:     Mental Status: She is alert and oriented to person, place, and time.  Psychiatric:        Attention and Perception:  Attention normal.        Mood and Affect: Mood normal.        Speech: Speech normal.        Behavior: Behavior normal. Behavior is cooperative.        Thought Content: Thought content normal.     Results for orders placed or performed in visit on 07/07/20  Bayer DCA Hb A1c Waived  Result Value Ref Range   HB A1C (BAYER DCA - WAIVED) 7.0 (H) <6.7 %  Basic metabolic panel  Result Value Ref Range   Glucose 185 (H) 65 - 99 mg/dL   BUN 20 8 - 27 mg/dL   Creatinine, Ser 1.05 (H) 0.57 - 1.00 mg/dL   GFR calc non Af Amer 52 (L) >59 mL/min/1.73   GFR calc Af Amer 60 >59 mL/min/1.73   BUN/Creatinine Ratio 19 12 - 28   Sodium 140 134 - 144 mmol/L   Potassium 4.4 3.5 - 5.2 mmol/L   Chloride 103 96 - 106 mmol/L   CO2 23 20 - 29 mmol/L   Calcium 9.6 8.7 - 10.3 mg/dL  Lipid Panel w/o Chol/HDL Ratio  Result Value Ref Range   Cholesterol, Total 164 100 - 199 mg/dL   Triglycerides 199 (H) 0 - 149 mg/dL   HDL 56 >39 mg/dL   VLDL Cholesterol Cal 33 5 - 40 mg/dL   LDL Chol Calc (NIH) 75 0 - 99 mg/dL  TSH  Result Value Ref Range   TSH 2.660 0.450 - 4.500 uIU/mL      Assessment & Plan:   Problem List Items Addressed This Visit      Cardiovascular and Mediastinum   Hypertension associated with diabetes ([place])    Chronic, stable with BP at goal in office today on recheck.  Continue current medication regimen and collaboration with cardiology. BMP today.  Recommend she monitor BP at least three days a week at home and  document for providers + bring to visits.  DASH diet focus.  Return in 3 months.      Relevant Orders   Bayer DCA Hb A1c Waived   Subclavian artery stenosis, left (HCC)    Stable, continue collaboration with cardiology and preventative treatment ASA, Plavix, and statin.      Coronary artery disease of bypass graft of native heart with stable angina pectoris [facility])    Continue collaboration with cardiology and preventative treatment.      Relevant Medications   sertraline  (ZOLOFT) 100 MG tablet     Endocrine   Hyperlipidemia associated with type 2 diabetes mellitus (HCC)    Chronic, ongoing.  Continue current medication regimen and adjust as needed.  Lipid panel today.  Discussed with patient, if myalgias with Atorvastatin, + her history of myalgia with statins, could consider Repatha initiation if poor tolerance to new regimen.      Relevant Orders   Bayer DCA Hb A1c Waived   Lipid Panel w/o Chol/HDL Ratio   CKD stage 3 due to type 2 diabetes mellitus (HCC)    Chronic, stable.  Continue Losartan for kidney protection.  Urine micro in office last visit ALB 30 and A:C <30. Continue collaboration with cardiology and endocrinology.  Consider nephrology referral if worsening function.  BMP today.      Relevant Orders   Bayer DCA Hb A1c Waived   Basic metabolic panel   Type 2 diabetes mellitus with hyperglycemia, with long-term current use of insulin (HCC) - Primary    Chronic, ongoing, followed by endocrinology with A1C today 8%.  Continue this collaboration and current medication regimen, defer changes to endo at this time, + collaboration with CCM team in office.  Would benefit from trial of SGLT in future (especially with cardiac health), will discuss with endocrinology.  Bring Libre to next visit.  Return to office in 3 months.        Other   Anxiety disorder    Chronic, ongoing.  Denies SI/HI.  Continue Sertraline and Ativan.  Has had multiple attempts at reduction of benzo with no success.  Discussed at length risks with her and she is aware.  Continue Ativan and fill upon request.  PDMP reviewed.  Return in 3 months,has up to date UDS and contract.      Relevant Medications   LORazepam (ATIVAN) 0.5 MG tablet   sertraline (ZOLOFT) 100 MG tablet   Polymyalgia rheumatica (HCC)    Chronic, ongoing.  Continue collaboration with rheumatology and current medication regimen.        Depression, recurrent (HCC)    Chronic, ongoing.  Denies SI/HI.  Continue  Sertraline  150 MG daily as is ordered, which she agrees to do. This could benefit mood overall and help reduce benzo use.  Consider increase to 200 MG in future for increased benefit.  Return in 3 months.      Relevant Medications   LORazepam (ATIVAN) 0.5 MG tablet   sertraline (ZOLOFT) 100 MG tablet       Follow up plan: Return in about 3 months (around 01/13/2021) for T2DM, HTN/HLD, RA, ANXIETY.

## 2020-10-13 NOTE — Assessment & Plan Note (Signed)
Chronic, ongoing.  Denies SI/HI.  Continue Sertraline and Ativan.  Has had multiple attempts at reduction of benzo with no success.  Discussed at length risks with her and she is aware.  Continue Ativan and fill upon request.  PDMP reviewed.  Return in 3 months, has up to date UDS and contract. 

## 2020-10-14 LAB — BASIC METABOLIC PANEL
BUN/Creatinine Ratio: 15 (ref 12–28)
BUN: 16 mg/dL (ref 8–27)
CO2: 23 mmol/L (ref 20–29)
Calcium: 9.3 mg/dL (ref 8.7–10.3)
Chloride: 103 mmol/L (ref 96–106)
Creatinine, Ser: 1.06 mg/dL — ABNORMAL HIGH (ref 0.57–1.00)
GFR calc Af Amer: 59 mL/min/{1.73_m2} — ABNORMAL LOW (ref >59)
GFR calc non Af Amer: 51 mL/min/{1.73_m2} — ABNORMAL LOW (ref >59)
Glucose: 339 mg/dL — ABNORMAL HIGH (ref 65–99)
Potassium: 4.3 mmol/L (ref 3.5–5.2)
Sodium: 139 mmol/L (ref 134–144)

## 2020-10-14 LAB — LIPID PANEL W/O CHOL/HDL RATIO
Cholesterol, Total: 161 mg/dL (ref 100–199)
HDL: 55 mg/dL (ref >39)
LDL Chol Calc (NIH): 74 mg/dL (ref 0–99)
Triglycerides: 195 mg/dL — ABNORMAL HIGH (ref 0–149)
VLDL Cholesterol Cal: 32 mg/dL (ref 5–40)

## 2020-10-14 NOTE — Progress Notes (Signed)
Contacted via [place] morning Keira, your labs have returned.  Kidney function continues to show some mild kidney disease with no worsening.  We will continue to monitor closely.  Glucose was elevated on these labs at 339, definitely continue to follow with endocrinology group.  Cholesterol levels show close to goal LDL, but triglycerides remain slightly elevated.  You could try taking some fish oil 1g daily and recheck next visit. Also should reduce saturated fats and sugars in diet.  Any questions? Keep being awesome!!  Thank you for allowing me to participate in your care. Kindest regards, Magenta Schmiesing

## 2020-10-20 DIAGNOSIS — E1169 Type 2 diabetes mellitus with other specified complication: Secondary | ICD-10-CM | POA: Diagnosis not present

## 2020-10-20 DIAGNOSIS — E785 Hyperlipidemia, unspecified: Secondary | ICD-10-CM | POA: Diagnosis not present

## 2020-10-20 DIAGNOSIS — E1159 Type 2 diabetes mellitus with other circulatory complications: Secondary | ICD-10-CM | POA: Diagnosis not present

## 2020-10-20 DIAGNOSIS — Z794 Long term (current) use of insulin: Secondary | ICD-10-CM | POA: Diagnosis not present

## 2020-10-20 DIAGNOSIS — E1165 Type 2 diabetes mellitus with hyperglycemia: Secondary | ICD-10-CM | POA: Diagnosis not present

## 2020-10-20 DIAGNOSIS — I152 Hypertension secondary to endocrine disorders: Secondary | ICD-10-CM | POA: Diagnosis not present

## 2020-11-02 DIAGNOSIS — M0609 Rheumatoid arthritis without rheumatoid factor, multiple sites: Secondary | ICD-10-CM | POA: Diagnosis not present

## 2020-11-04 ENCOUNTER — Ambulatory Visit (INDEPENDENT_AMBULATORY_CARE_PROVIDER_SITE_OTHER): Payer: Medicare Other | Admitting: Dermatology

## 2020-11-04 ENCOUNTER — Other Ambulatory Visit: Payer: Self-pay

## 2020-11-04 DIAGNOSIS — L578 Other skin changes due to chronic exposure to nonionizing radiation: Secondary | ICD-10-CM

## 2020-11-04 DIAGNOSIS — L821 Other seborrheic keratosis: Secondary | ICD-10-CM

## 2020-11-04 DIAGNOSIS — L820 Inflamed seborrheic keratosis: Secondary | ICD-10-CM

## 2020-11-04 DIAGNOSIS — I2510 Atherosclerotic heart disease of native coronary artery without angina pectoris: Secondary | ICD-10-CM | POA: Diagnosis not present

## 2020-11-04 NOTE — Progress Notes (Signed)
   Follow-Up Visit   Subjective  Laura Rojas is a 74 y.o. female who presents for the following: ISK to retreat (L temple, txted with LN2 08/23/20).  Patient accompanied by husband who contributes to history.  The following portions of the chart were reviewed this encounter and updated as appropriate:   Tobacco  Allergies  Meds  Problems  Med Hx  Surg Hx  Fam Hx     Review of Systems:  No other skin or systemic complaints except as noted in HPI or Assessment and Plan.  Objective  Well appearing patient in no apparent distress; mood and affect are within normal limits.  A focused examination was performed including face. Relevant physical exam findings are noted in the Assessment and Plan.  Objective  L temple x 1: Residual stuck on waxy pap with erythema   Assessment & Plan  Inflamed seborrheic keratosis L temple x 1  Residual  Destruction of lesion - L temple x 1 Complexity: simple   Destruction method: cryotherapy   Informed consent: discussed and consent obtained   Timeout:  patient name, date of birth, surgical site, and procedure verified Lesion destroyed using liquid nitrogen: Yes   Region frozen until ice ball extended beyond lesion: Yes   Outcome: patient tolerated procedure well with no complications   Post-procedure details: wound care instructions given    Seborrheic Keratoses - Stuck-on, waxy, tan-brown papules and plaques  - Discussed benign etiology and prognosis. - Observe - Call for any changes  Actinic Damage - chronic, secondary to cumulative UV radiation exposure/sun exposure over time - diffuse scaly erythematous macules with underlying dyspigmentation - Recommend daily broad spectrum sunscreen SPF 30+ to sun-exposed areas, reapply every 2 hours as needed.  - Call for new or changing lesions.  Return if symptoms worsen or fail to improve.   I, Othelia Pulling, RMA, am acting as scribe for Sarina Ser, MD .  Documentation: I have  reviewed the above documentation for accuracy and completeness, and I agree with the above.  Sarina Ser, MD

## 2020-11-11 ENCOUNTER — Encounter: Payer: Self-pay | Admitting: Dermatology

## 2020-11-15 ENCOUNTER — Telehealth: Payer: Self-pay | Admitting: Pharmacist

## 2020-11-15 NOTE — Chronic Care Management (AMB) (Addendum)
Chronic Care Management Pharmacy Assistant   Name: Laura Rojas  MRN: [id number] DOB: 19-Jan-1946  Reason for Encounter: Diabetes Adherence Call  Patient Questions:  1.  Have you seen any other providers since your last visit? Yes, 10/07/20-Croitoru, Dani Gobble, MD (OV), 10/13/20-Cannady, Barbaraann Faster, NP (OV), 10/20/20-Blackwood, Patsey Berthold and Toni Arthurs (OV), 11/02/20-Patel, Mayur Khandu (Infusion), 11/04/20-Kowalski, Monia Sabal, MD (OV)     2.  Any changes in your medicines or health? Yes, 10/07/20- (Croitoru, Mihai, MD) Atorvastatin Calcium 40 mg added, Simvastatin 40 mg Discontinued. 10/13/20- (Cannady, Jolene T, NP) Folic Acid 1 mg discontinued, Methotrexate Sodium 25 mg discontinued, Pantorazole Sodium 40 mg discontinued.    PCP : Venita Lick, NP  Allergies:   Allergies  Allergen Reactions   Compazine [Prochlorperazine Edisylate] Other (See Comments)    choking   Altace [Ramipril]    Crestor [Rosuvastatin Calcium] Other (See Comments)    Myalgias   Reglan [Metoclopramide]    Sulfa Antibiotics Other (See Comments)    headache   Actos [Pioglitazone] Other (See Comments)    Hot Flashes   Amlodipine Swelling   Latex Rash    (Bandaids only )   Lipitor [Atorvastatin] Other (See Comments)    Cramps    Medications: Outpatient Encounter Medications as of 11/15/2020  Medication Sig Note   amLODipine (NORVASC) 2.5 MG tablet TAKE 1 TABLET BY MOUTH EVERY DAY    Apoaequorin (PREVAGEN) 10 MG CAPS Take by mouth.    aspirin 81 MG tablet Take 81 mg by mouth daily.    atorvastatin (LIPITOR) 40 MG tablet Take 1 tablet (40 mg total) by mouth daily.    cholecalciferol (VITAMIN D3) 25 MCG (1000 UT) tablet Take 1,000 Units by mouth daily.    clopidogrel (PLAVIX) 75 MG tablet TAKE 1 TABLET BY MOUTH EVERY DAY    Continuous Blood Gluc Sensor (FREESTYLE LIBRE 14 DAY SENSOR) MISC Use 1 kit every 14 (fourteen) days E11.65    hydrochlorothiazide (MICROZIDE) 12.5 MG capsule Take 1  capsule (12.5 mg total) by mouth daily.    inFLIXimab (REMICADE) 100 MG injection Inject 300 mg into the vein. For RA.Called patient. Advised patient of provider's approval for requested procedure, as well as any comments/instructions from provider.   Provided patient w/ verbal instructions concerning pre-, intra- and post-procedure preparation and instructions. Does not know dose. Next inusion 11/02/20 then q8 weeks    Insulin Glargine (BASAGLAR KWIKPEN) 100 UNIT/ML SOPN Inject 20 Units into the skin at bedtime.     LORazepam (ATIVAN) 0.5 MG tablet TAKE 1 TAB BY MOUTH TWICE A DAY (USE THE ADDITIONAL 30 TABS ONLY SPARINGLY & ONLY FOR SEVERE ANXIETY)    losartan (COZAAR) 100 MG tablet Take 1 tablet (100 mg total) by mouth daily.    Magnesium 400 MG TABS Take 1 tablet by mouth daily.    metFORMIN (GLUCOPHAGE) 500 MG tablet Take 500 mg by mouth 2 (two) times daily.     Multiple Vitamin (MULTIVITAMIN) tablet Take 1 tablet by mouth daily.    NOVOLOG 100 UNIT/ML injection Inject 10 Units into the skin 3 (three) times daily. 05/11/2020: 18 units + sliding scale    Omega-3 Fatty Acids (FISH OIL) 1200 MG CAPS Take 1,200 mg by mouth daily.    sertraline (ZOLOFT) 100 MG tablet Take 1.5 tablets (150 mg total) by mouth daily.    vitamin B-12 (CYANOCOBALAMIN) 1000 MCG tablet Take 1,000 mcg by mouth daily.    No facility-administered encounter medications on file  as of 11/15/2020.    Current Diagnosis: Patient Active Problem List   Diagnosis Date Noted   Long-term current use of benzodiazepine 03/17/2020   Gastroesophageal reflux disease without esophagitis 12/17/2019   Type 2 diabetes mellitus with hyperglycemia, with long-term current use of insulin ([place]) 11/09/2019   Encounter for screening colonoscopy    Polyp of transverse colon    Coronary artery disease of bypass graft of native heart with stable angina pectoris ([place]) 07/17/2019   Depression, recurrent ([place]) 10/15/2018   Polymyalgia rheumatica  ([place]) 05/15/2018   Glossitis 05/15/2018   Pernicious anemia 05/15/2018   Chronic arthralgias of knees and hips 05/01/2018   Restless leg syndrome 10/16/2017   Advanced care planning/counseling discussion 08/23/2017   Insomnia 05/24/2017   CKD stage 3 due to type 2 diabetes mellitus ([place]) 04/13/2016   Fe deficiency anemia 12/01/2015   Anxiety disorder 05/26/2015   Chronic cough 02/17/2015   Subclavian artery stenosis, left ([place]) 01/01/2015   Hypertension associated with diabetes ([place]) 12/31/2013   Hyperlipidemia associated with type 2 diabetes mellitus ([place]) 12/31/2013   Recent Relevant Labs: Lab Results  Component Value Date/Time   HGBA1C 8.0 (H) 10/13/2020 09:32 AM   HGBA1C 7.0 (H) 07/07/2020 10:36 AM   HGBA1C 8.2 12/31/2019 12:00 AM   HGBA1C 8.8% 09/24/2019 12:00 AM   MICROALBUR 30 (H) 03/17/2020 10:39 AM   MICROALBUR 10 12/17/2019 09:37 AM    Kidney Function Lab Results  Component Value Date/Time   CREATININE 1.06 (H) 10/13/2020 09:34 AM   CREATININE 1.05 (H) 07/07/2020 02:02 PM   GFRNONAA 51 (L) 10/13/2020 09:34 AM   GFRAA 59 (L) 10/13/2020 09:34 AM    Current antihyperglycemic regimen:  Metformin 500 mg  Basaglar 20 units Novolog 18 units with meals three times daily  What recent interventions/DTPs have been made to improve glycemic control:  Patient is to eat regular meals containing a protein, a starch and vegetables.  Have there been any recent hospitalizations or ED visits since last visit with CPP? No   Patient reports hypoglycemic symptoms, including Pale, Sweaty, Shaky, Hungry, Nervous/irritable and Vision changes   Patient reports hyperglycemic symptoms, including fatigue and polyuria. Patient states sometimes short tempered.  How often are you checking your blood sugar? Patient reports checking blood glucose at least 6 times daily fasting, before meals, after meals and at bedtime.  What are your blood sugars ranging?  Fasting: 200 BG reading reported  on 11/15/20 this morning.  Before meals: 120s before lunch on 11/15/20. After meals: n/a Bedtime: n/a During the week, how often does your blood glucose drop below 70?  Patient stated not recently. Are you checking your feet daily/regularly? Patient stated she is checking daily.  Adherence Review: Is the patient currently on a STATIN medication? Yes. Atorvastatin 40 mg tablet. Is the patient currently on ACE/ARB medication? Yes. Losartan 100 mg Does the patient have >5 day gap between last estimated fill dates? No    Goals Addressed   None    11/15/20-CPA called and spoke with the patient regarding her blood glucose monitoring. The patient communicated that she was experiencing hypoglycemic episodes and blood glucose readings ranging in the 40's and 50's overnight about a month ago with symptoms of paleness, sweatiness, Shaky, increased appetite, irritable, and vision changes. The patient reported when her blood sugar drops overnight she will get a glass of orange juice, eat some cheese and crackers. The patient denies having any hospitalizations or ED visits since last visit with CPP. The patient  verbalized she checks her blood sugar at least 6 times daily but does not keep a log. The patient stated her recent reading was taken this morning ranging at 200 before eating or drinking after getting out of bed and ranging in the 120s before lunch. Patient reports hyperglycemic symptoms, including fatigue and polyuria; sometimes short tempered. Per patient; "not recently my blood sugar goes below 70 and can't recall the date it did". The patient states when she goes out to eat for lunch she eats grilled chicken salads with side of fruit, pita bread; at home she typically cooks meals that consist of roast beef, grilled or barbecue chicken, green beans, sweet peas, beet pickle salad, corn potatoes, homemade mac and cheese, and corn. For breakfast; she will eat a breakfast bar per patient. The patient  confirmed she is taking current antihyperglycemic regimen of Metformin 500 mg twice daily, Novolog 18 units three times daily and Basaglar 20 units at bedtime. CPA encouraged the patient to keep a log of her blood glucose readings to provide at future appointments. The patient verbalized understanding.  Raynelle Highland, [place] Assistant [phone]    Follow-Up:  Pharmacist Review  I have reviewed the care management and care coordination activities outlined in this encounter and I am certifying that I agree with the content of this note.  Follow-up visit scheduled with PharmD 12/20/20 Junita Push. Kenton Kingfisher PharmD, [facility] [phone]

## 2020-11-17 ENCOUNTER — Other Ambulatory Visit: Payer: Self-pay | Admitting: Nurse Practitioner

## 2020-11-17 DIAGNOSIS — I10 Essential (primary) hypertension: Secondary | ICD-10-CM

## 2020-11-17 NOTE — Telephone Encounter (Signed)
Patient last seen 10/13/20 and has appointment 01/19/21

## 2020-11-25 ENCOUNTER — Other Ambulatory Visit: Payer: Self-pay

## 2020-11-25 DIAGNOSIS — I2510 Atherosclerotic heart disease of native coronary artery without angina pectoris: Secondary | ICD-10-CM

## 2020-11-25 DIAGNOSIS — I2583 Coronary atherosclerosis due to lipid rich plaque: Secondary | ICD-10-CM

## 2020-11-25 MED ORDER — CLOPIDOGREL BISULFATE 75 MG PO TABS: 75.0000 mg | ORAL_TABLET | Freq: Every day | ORAL | 4 refills | Status: DC

## 2020-11-25 NOTE — Telephone Encounter (Signed)
Refill request today for Clopidogrel 75 mg tab QD  Last ov was 10/13/20 No up coming appt

## 2020-12-01 DIAGNOSIS — E119 Type 2 diabetes mellitus without complications: Secondary | ICD-10-CM | POA: Diagnosis not present

## 2020-12-01 LAB — HM DIABETES EYE EXAM

## 2020-12-20 ENCOUNTER — Ambulatory Visit: Payer: Medicare Other | Admitting: Pharmacist

## 2020-12-20 NOTE — Chronic Care Management (AMB) (Unsigned)
Chronic Care Management Pharmacy  Name: Laura Rojas  MRN: [id number] DOB: [birth year]   Chief Complaint/ HPI  Laura Rojas,  75 y.o. , female presents for their Follow-Up CCM visit with the clinical pharmacist via telephone.  PCP : Venita Lick, NP Patient Care Team: Venita Lick, NP as PCP - General (Nurse Practitioner) Marlowe Sax, MD as Referring Physician (Internal Medicine) Warnell Forester, NP as Nurse Practitioner (Surgery)  Their chronic conditions include: Hypertension, Hyperlipidemia, Diabetes, Coronary Artery Disease, GERD, Chronic Kidney Disease, Depression and Anxiety, Polymyalgia Rheumatica, subclavian artery stenosis, CAD bypass graft, stable angina, She had stents placed in the proximal LAD and proximal RCA (2007, 3.0 x 28 mm drug-eluting Cypher stent to RCA, 3.5 x 33 mm drug-eluting Cypher stent and proximal LAD).  Office Visits: 10/13/20- [name], NP- blood work, ? Fish oil 1 gm qd, reduce saturated fats and sugar for TG 8/11/-21 - [name], NP - labs gfr 52, a1c 7, a;c<30   Consult Visit: 10/20/20-Hilary Pasty Arch, NP , Endocrinology- no med changes, Freestyle download: Avg BG 201, fasting 184-271, Lunch 106-294, Dinner 96-251, Bedtime 12am [phone] 10/07/20- Dr. Orene Desanctis, Cardiology- EKG NSR QTc 431 ms, change to atorvastatin 40 07/14/20- Malissa Hippo, NP , Endocrinology- continue med, return in 3 mos 05/11/20- Catie Darnelle Maffucci, PharmD, [place], dec Novlolg at supper, check bg qid  Allergies  Allergen Reactions  . Compazine [Prochlorperazine Edisylate] Other (See Comments)    choking  . Altace [Ramipril]   . Crestor [Rosuvastatin Calcium] Other (See Comments)    Myalgias  . Reglan [Metoclopramide]   . Sulfa Antibiotics Other (See Comments)    headache  . Actos [Pioglitazone] Other (See Comments)    Hot Flashes  . Amlodipine Swelling  . Latex Rash    (Bandaids only )  . Lipitor [Atorvastatin] Other (See  Comments)    Cramps    Medications: Outpatient Encounter Medications as of 12/20/2020  Medication Sig Note  . hydrochlorothiazide (MICROZIDE) 12.5 MG capsule TAKE 1 CAPSULE BY MOUTH EVERY DAY   . amLODipine (NORVASC) 2.5 MG tablet TAKE 1 TABLET BY MOUTH EVERY DAY   . Apoaequorin (PREVAGEN) 10 MG CAPS Take by mouth.   Marland Kitchen aspirin 81 MG tablet Take 81 mg by mouth daily.   Marland Kitchen atorvastatin (LIPITOR) 40 MG tablet Take 1 tablet (40 mg total) by mouth daily.   . cholecalciferol (VITAMIN D3) 25 MCG (1000 UT) tablet Take 1,000 Units by mouth daily.   . clopidogrel (PLAVIX) 75 MG tablet Take 1 tablet (75 mg total) by mouth daily.   . Continuous Blood Gluc Sensor (FREESTYLE LIBRE 14 DAY SENSOR) MISC Use 1 kit every 14 (fourteen) days E11.65   . inFLIXimab (REMICADE) 100 MG injection Inject 300 mg into the vein. For RA.Called patient. Advised patient of provider's approval for requested procedure, as well as any comments/instructions from provider.   Provided patient w/ verbal instructions concerning pre-, intra- and post-procedure preparation and instructions. Does not know dose. Next inusion 11/02/20 then q8 weeks   . Insulin Glargine (BASAGLAR KWIKPEN) 100 UNIT/ML SOPN Inject 20 Units into the skin at bedtime.    Marland Kitchen LORazepam (ATIVAN) 0.5 MG tablet TAKE 1 TAB BY MOUTH TWICE A DAY (USE THE ADDITIONAL 30 TABS ONLY SPARINGLY & ONLY FOR SEVERE ANXIETY)   . losartan (COZAAR) 100 MG tablet Take 1 tablet (100 mg total) by mouth daily.   . Magnesium 400 MG TABS Take 1 tablet by mouth daily.   . metFORMIN (GLUCOPHAGE)  500 MG tablet Take 500 mg by mouth 2 (two) times daily.    . Multiple Vitamin (MULTIVITAMIN) tablet Take 1 tablet by mouth daily.   Marland Kitchen NOVOLOG 100 UNIT/ML injection Inject 10 Units into the skin 3 (three) times daily. 05/11/2020: 18 units + sliding scale   . Omega-3 Fatty Acids (FISH OIL) 1200 MG CAPS Take 1,200 mg by mouth daily.   . sertraline (ZOLOFT) 100 MG tablet Take 1.5 tablets (150 mg total)  by mouth daily.   . vitamin B-12 (CYANOCOBALAMIN) 1000 MCG tablet Take 1,000 mcg by mouth daily.    No facility-administered encounter medications on file as of 12/20/2020.    Wt Readings from Last 3 Encounters:  10/13/20 166 lb 12.8 oz (75.7 kg)  10/07/20 167 lb (75.8 kg)  08/30/20 167 lb (75.8 kg)    Current Diagnosis/Assessment:    Goals Addressed   None     Diabetes   A1c goal <7%  Follows w/ [facility] Endocrinology  Recent Relevant Labs: Lab Results  Component Value Date/Time   HGBA1C 8.0 (H) 10/13/2020 09:32 AM   HGBA1C 7.0 (H) 07/07/2020 10:36 AM   HGBA1C 8.2 12/31/2019 12:00 AM   HGBA1C 8.8% 09/24/2019 12:00 AM   MICROALBUR 30 (H) 03/17/2020 10:39 AM   MICROALBUR 10 12/17/2019 09:37 AM   A1C at endo 7.6 Last diabetic Eye exam:  Lab Results  Component Value Date/Time   HMDIABEYEEXA No Retinopathy 02/26/2020 12:00 AM   HMDIABEYEEXA No Retinopathy 02/26/2020 12:00 AM    Last diabetic Foot exam:  Lab Results  Component Value Date/Time   HMDIABFOOTEX Normal 05/07/2018 12:00 AM     Checking BG: > 6 times daily  Recent FBG Readings: 200 Recent pre-meal BG readings: 130s  Recent 2hr PP BG readings:  200s Recent HS BG readings: 130   Patient has failed these meds in past: Trulicity- weakness, metformin --GI effects at increased dos Patient is currently controlled on the following medications: Marland Kitchen Metformin 500 mg bid  . Basaglar 20 unit qpm . Novolog 18 units with meals + SS Take 2 units of NovoLog if your sugar is 150-200 Take 4 units of NovoLog if your sugar is 201-250 Take 6 units of NovoLog if your sugar is 251-300 Take 8 units of NovoLog if your sugar is 301-350 Take 10 units of NovoLog if your sugar is 351-400 Take 12 units of NovoLog if your sugar is over 401   We discussed: Uses Freestyle Libre and checks ~ 6 times daily. Reviewed goal glucose readings for an A1c of <7%, we want to see fasting sugars <130 and 2 hour after meal sugars <180. Patient has  started walking 25 minutes daily. Has been having a snack before bed (cheese and crackers) or "something I shouldn't have like orange juice".  My sweet tooth   Plan  Continue current medications  Hypertension/CKD   BP goal is:  <130/80  Followed by Dr. Orene Desanctis  Office blood pressures are  BP Readings from Last 3 Encounters:  10/13/20 138/80  10/07/20 132/74  07/07/20 136/77   BMP Latest Ref Rng & Units 10/13/2020 07/07/2020 03/17/2020  Glucose 65 - 99 mg/dL 339(H) 185(H) 189(H)  BUN 8 - 27 mg/dL 16 20 18   Creatinine 0.57 - 1.00 mg/dL 1.06(H) 1.05(H) 0.91  BUN/Creat Ratio 12 - 28 15 19 20   Sodium 134 - 144 mmol/L 139 140 141  Potassium 3.5 - 5.2 mmol/L 4.3 4.4 4.5  Chloride 96 - 106 mmol/L 103 103 104  CO2 20 -  29 mmol/L 23 23 22   Calcium 8.7 - 10.3 mg/dL 9.3 9.6 9.7    Patient checks BP at home infrequently  Patient has failed these meds in the past: NA Patient is currently controlled on the following medications:  . Amlodipine 2.5 mg daily . HCTZ 12.5 mg daily . Losartan 100 mg daily  We discussed Checking blood pressure ~1-2  times weekly. Make sure you are sitting down for at least 5 minutes before, resting calmly, with your feet flat on the floor. Write down these readings to review at future appointments. Low sodium diet.    Plan  Continue current medications   Rheumatoid Arthritis   Patient has failed these meds in past: Methotrexate Patient is currently controlled on the following medications:  . Remicade injections 370m  q 8 weeks   We discussed:  Patient has discontinued methotrexate. Getting infusions every four weeks working up to maintenance ot every 8 weeks. Next infusion 12/28/20 per patient. Tolerating well.  Plan  Continue current medications.    Depression / Anxiety   PHQ9 Score:  PHQ9 SCORE ONLY 10/13/2020 08/30/2020 07/07/2020  PHQ-9 Total Score 3 0 0   GAD7 Score: GAD 7 : Generalized Anxiety Score 10/13/2020 03/17/2020 10/14/2019  12/19/2017  Nervous, Anxious, on Edge 0 0 3 3  Control/stop worrying 0 0 3 2  Worry too much - different things 0 0 3 3  Trouble relaxing 0 0 3 3  Restless 0 0 0 3  Easily annoyed or irritable 0 0 3 3  Afraid - awful might happen 0 0 3 2  Total GAD 7 Score 0 0 18 19  Anxiety Difficulty - Not difficult at all Somewhat difficult Very difficult    Patient is currently controlled on the following medications:   Sertraline 1548mqd    Plan  Continue current medications. Will discuss at next visit. GERD   Patient has failed these meds in past: NA Patient is currently controlled on the following medications:  . Pantoprazole 40 mg qd  We discussed: Patient has  Plan  Continue current medications.   Hyperlipidemia/ CAD/ Subclavian stenosis   LDL goal < 70  Lipid Panel     Component Value Date/Time   CHOL 161 10/13/2020 0934   CHOL 170 05/08/2019 0939   TRIG 195 (H) 10/13/2020 0934   TRIG 367 (H) 05/08/2019 0939   HDL 55 10/13/2020 0934   LDLCALC 74 10/13/2020 0934    Hepatic Function Latest Ref Rng & Units 12/17/2019 05/08/2019 08/28/2018  Total Protein 6.0 - 8.5 g/dL 6.4 - 6.1  Albumin 3.7 - 4.7 g/dL 4.7 - 4.2  AST 0 - 40 IU/L 28 26 19   ALT 0 - 32 IU/L 26 29 29   Alk Phosphatase 39 - 117 IU/L 150(H) - 79  Total Bilirubin 0.0 - 1.2 mg/dL 0.3 - 0.3     The 10-year ASCVD risk score (GMikey BussingC Jr., et al., 2013) is: 35.2%   Values used to calculate the score:     Age: [age]     Sex: Female     Is Non-Hispanic African American: No     Diabetic: Yes     Tobacco smoker: No     Systolic Blood Pressure: 12144mHg     Is BP treated: Yes     HDL Cholesterol: 55 mg/dL     Total Cholesterol: 161 mg/dL   Patient has failed these meds in past: NA Patient is currently query  controlled on the following  medications:  . Atorvastatin 40 mg qd  We discussed: Changed to atorvastatin at cards visit in November. Patient is tolerating well.    Plan  Continue current  medications.  CAD   Patient has failed these meds in past: NA Patient is currently controlled on the following medications:  . Aspirin 81 mg qd . Plavix 75 mg qd  We discussed: Patient   Plan  Continue current medications. Will discuss at next visit  Vaccines   Reviewed and discussed patient's vaccination history.    Immunization History  Administered Date(s) Administered  . Influenza, High Dose Seasonal PF 09/04/2016, 08/23/2017, 08/28/2018, 07/14/2019, 09/03/2020  . Influenza,inj,Quad PF,6+ Mos 09/27/2015  . Influenza-Unspecified 09/18/2014  . PFIZER(Purple Top)SARS-COV-2 Vaccination 12/04/2019, 12/25/2019  . Pneumococcal Conjugate-13 04/22/2014  . Pneumococcal Polysaccharide-23 11/12/2008, 07/05/2016  . Td 12/21/2009, 03/17/2020  . Zoster Recombinat (Shingrix) 07/14/2019, 10/15/2019    Plan  Recommended patient receive *** vaccine in *** office.    Medication Management   Pt uses [facility] for all medication  Plan  Will discuss at next visit    Follow up: 3  month phone visit  Junita Push. Kenton Kingfisher PharmD, [facility] [phone]

## 2020-12-22 DIAGNOSIS — Z79899 Other long term (current) drug therapy: Secondary | ICD-10-CM | POA: Diagnosis not present

## 2020-12-22 DIAGNOSIS — M8949 Other hypertrophic osteoarthropathy, multiple sites: Secondary | ICD-10-CM | POA: Diagnosis not present

## 2020-12-22 DIAGNOSIS — M0609 Rheumatoid arthritis without rheumatoid factor, multiple sites: Secondary | ICD-10-CM | POA: Diagnosis not present

## 2020-12-28 DIAGNOSIS — M0609 Rheumatoid arthritis without rheumatoid factor, multiple sites: Secondary | ICD-10-CM | POA: Diagnosis not present

## 2021-01-10 ENCOUNTER — Ambulatory Visit: Payer: Self-pay | Admitting: *Deleted

## 2021-01-10 NOTE — Telephone Encounter (Signed)
Pt called with complaints of rectal bleeding that started on 01/07/21; the pt says she has 4 episodes; she says there is blood in the commode and mixed in her stool; she is unable to quantify the amount; recommendations made per nurse triage protocol; the pt requests to be seen in office; contacted [place] regarding scheduling pt; she gives ok to schedule pt in office; decision tree completed; pt offered and accepted appt with [name], [place], 01/11/21 at 0900; will route to office for notification.  Reason for Disposition . Taking Coumadin (warfarin) or other strong blood thinner, or known bleeding disorder (e.g., thrombocytopenia)  Answer Assessment - Initial Assessment Questions 1. APPEARANCE of BLOOD: "What color is it?" "Is it passed separately, on the surface of the stool, or mixed in with the stool?"      brrght red 2. AMOUNT: "How much blood was passed?"      Unable to quantify 3. FREQUENCY: "How many times has blood been passed with the stools?"     4 times (daily with BM) 4. ONSET: "When was the blood first seen in the stools?" (Days or weeks)      01/07/21 5. DIARRHEA: "Is there also some diarrhea?" If Yes, ask: "How many diarrhea stools were passed in past 24 hours?"      no 6. CONSTIPATION: "Do you have constipation?" If Yes, ask: "How bad is it?"    no 7. RECURRENT SYMPTOMS: "Have you had blood in your stools before?" If Yes, ask: "When was the last time?" and "What happened that time?"     no 8. BLOOD THINNERS: "Do you take any blood thinners?" (e.g., Coumadin/warfarin, Pradaxa/dabigatran, aspirin)    81 mg aspirin nightly and plavix 9. OTHER SYMPTOMS: "Do you have any other symptoms?"  (e.g., abdominal pain, vomiting, dizziness, fever)     no 10. PREGNANCY: "Is there any chance you are pregnant?" "When was your last menstrual period?"       no  Protocols used: RECTAL BLEEDING-A-AH

## 2021-01-11 ENCOUNTER — Encounter: Payer: Self-pay | Admitting: Nurse Practitioner

## 2021-01-11 ENCOUNTER — Other Ambulatory Visit: Payer: Self-pay

## 2021-01-11 ENCOUNTER — Ambulatory Visit (INDEPENDENT_AMBULATORY_CARE_PROVIDER_SITE_OTHER): Payer: Medicare Other | Admitting: Nurse Practitioner

## 2021-01-11 VITALS — BP 133/81 | HR 64 | Temp 97.8°F | Wt 164.0 lb

## 2021-01-11 DIAGNOSIS — N183 Chronic kidney disease, stage 3 unspecified: Secondary | ICD-10-CM | POA: Diagnosis not present

## 2021-01-11 DIAGNOSIS — F413 Other mixed anxiety disorders: Secondary | ICD-10-CM | POA: Diagnosis not present

## 2021-01-11 DIAGNOSIS — F339 Major depressive disorder, recurrent, unspecified: Secondary | ICD-10-CM | POA: Diagnosis not present

## 2021-01-11 DIAGNOSIS — M353 Polymyalgia rheumatica: Secondary | ICD-10-CM

## 2021-01-11 DIAGNOSIS — Z79899 Other long term (current) drug therapy: Secondary | ICD-10-CM

## 2021-01-11 DIAGNOSIS — E1122 Type 2 diabetes mellitus with diabetic chronic kidney disease: Secondary | ICD-10-CM | POA: Diagnosis not present

## 2021-01-11 DIAGNOSIS — Z794 Long term (current) use of insulin: Secondary | ICD-10-CM

## 2021-01-11 DIAGNOSIS — E1165 Type 2 diabetes mellitus with hyperglycemia: Secondary | ICD-10-CM

## 2021-01-11 DIAGNOSIS — E785 Hyperlipidemia, unspecified: Secondary | ICD-10-CM | POA: Diagnosis not present

## 2021-01-11 DIAGNOSIS — E1169 Type 2 diabetes mellitus with other specified complication: Secondary | ICD-10-CM | POA: Diagnosis not present

## 2021-01-11 DIAGNOSIS — K625 Hemorrhage of anus and rectum: Secondary | ICD-10-CM | POA: Diagnosis not present

## 2021-01-11 DIAGNOSIS — I152 Hypertension secondary to endocrine disorders: Secondary | ICD-10-CM

## 2021-01-11 DIAGNOSIS — I771 Stricture of artery: Secondary | ICD-10-CM | POA: Diagnosis not present

## 2021-01-11 DIAGNOSIS — I25708 Atherosclerosis of coronary artery bypass graft(s), unspecified, with other forms of angina pectoris: Secondary | ICD-10-CM | POA: Diagnosis not present

## 2021-01-11 DIAGNOSIS — E538 Deficiency of other specified B group vitamins: Secondary | ICD-10-CM

## 2021-01-11 DIAGNOSIS — E1159 Type 2 diabetes mellitus with other circulatory complications: Secondary | ICD-10-CM

## 2021-01-11 LAB — CBC WITH DIFFERENTIAL/PLATELET
Hematocrit: 35.4 % (ref 34.0–46.6)
Hemoglobin: 11.6 g/dL (ref 11.1–15.9)
Lymphocytes Absolute: 1.8 10*3/uL (ref 0.7–3.1)
Lymphs: 30 %
MCH: 28.9 pg (ref 26.6–33.0)
MCHC: 32.8 g/dL (ref 31.5–35.7)
MCV: 88 fL (ref 79–97)
MID (Absolute): 0.6 10*3/uL (ref 0.1–1.6)
MID: 9 %
Neutrophils Absolute: 3.6 10*3/uL (ref 1.4–7.0)
Neutrophils: 61 %
Platelets: 239 10*3/uL (ref 150–450)
RBC: 4.01 x10E6/uL (ref 3.77–5.28)
RDW: 15.1 % (ref 11.7–15.4)
WBC: 6 10*3/uL (ref 3.4–10.8)

## 2021-01-11 LAB — MICROALBUMIN, URINE WAIVED
Creatinine, Urine Waived: 300 mg/dL (ref 10–300)
Microalb, Ur Waived: 30 mg/L — ABNORMAL HIGH (ref 0–19)
Microalb/Creat Ratio: <30 mg/g (ref <30)

## 2021-01-11 LAB — BAYER DCA HB A1C WAIVED: HB A1C (BAYER DCA - WAIVED): 8.2 % — ABNORMAL HIGH (ref <7.0)

## 2021-01-11 MED ORDER — HYDROCORTISONE ACETATE 25 MG RE SUPP: 25.0000 mg | Freq: Two times a day (BID) | RECTAL | 0 refills | Status: DC

## 2021-01-11 MED ORDER — LORAZEPAM 0.5 MG PO TABS: ORAL_TABLET | ORAL | 0 refills | Status: DC

## 2021-01-11 NOTE — Assessment & Plan Note (Signed)
Chronic, ongoing.  Continue current medication regimen and adjust as needed.  Lipid panel today.  Discussed with patient, if myalgias with Atorvastatin, + her history of myalgia with statins, could consider Repatha initiation if poor tolerance to new regimen.

## 2021-01-11 NOTE — Assessment & Plan Note (Signed)
Stable, continue collaboration with cardiology and preventative treatment ASA, Plavix, and statin.

## 2021-01-11 NOTE — Assessment & Plan Note (Signed)
Chronic, stable.  Continue Losartan for kidney protection.  Urine micro in office last visit ALB 30 and A:C <30. Continue collaboration with cardiology and endocrinology.  Consider nephrology referral if worsening function.  CMP today.

## 2021-01-11 NOTE — Assessment & Plan Note (Signed)
Continue collaboration with cardiology and preventative treatment.

## 2021-01-11 NOTE — Assessment & Plan Note (Signed)
Chronic, ongoing.  Denies SI/HI.  Continue Sertraline  150 MG daily as is ordered, which she agrees to do. This could benefit mood overall and help reduce benzo use.  Consider increase to 200 MG in future for increased benefit.  Return in 3 months.

## 2021-01-11 NOTE — Assessment & Plan Note (Signed)
Chronic, stable with BP at goal in office today.  Continue current medication regimen and collaboration with cardiology. CMP today.  Recommend she monitor BP at least three days a week at home and document for providers + bring to visits.  DASH diet focus.  Return in 3 months.

## 2021-01-11 NOTE — Assessment & Plan Note (Signed)
Acute, suspect related to internal and external hemorrhoids.  Will send in Anusol.  Recommend she hold her ASA and Plavix for a few days, then restart.  Recommend [place] of Miralax daily and Colace as needed.  CBC in office today unremarkable, no anemia present.  Obtain CMP today and FOBT.  If ongoing or worsening send to GI.  Return in one week.

## 2021-01-11 NOTE — Assessment & Plan Note (Signed)
Chronic, ongoing, followed by endocrinology with A1C today 8.2%.  Continue this collaboration and current medication regimen, defer changes to endo at this time, + collaboration with CCM team in office -- is seeing them in March.  Would benefit from trial of SGLT in future (especially with cardiac health), will discuss with endocrinology and further discuss next visit with patient.  Bring Libre to next visit.  Return to office in 3 months.

## 2021-01-11 NOTE — Addendum Note (Signed)
Addended by: [name] T on: 01/11/2021 01:48 PM   Modules accepted: Orders

## 2021-01-11 NOTE — Progress Notes (Addendum)
BP 133/81   Pulse 64   Temp 97.8 F (36.6 C) (Oral)   Wt 164 lb (74.4 kg)   SpO2 96%   BMI 27.29 kg/m    Subjective:    Patient ID: Laura Rojas, female    DOB: 11/17/1946, 75 y.o.   MRN: [id number]  HPI: Laura Rojas is a 75 y.o. female  Chief Complaint  Patient presents with  . Rectal Bleeding    Pt states she has been having the bleeding for the past four days, and only appears when pt has a bowel movements and she states there is blood in the toilet and when she wipes. Pt denies having any constipation or diarrhea.    DIABETES Followed by endocrinology and last saw NP [facility] 10/20/20.  Her last A1c was 8% in November.  She does endorse good/bad diet.  Takes daily B12 supplement due to history of low levels.  She continues on [employer], and Metformin.   Hypoglycemic episodes: a few -- did not bring [place] today Polydipsia/polyuria: no Visual disturbance: no Chest pain: no Paresthesias: no Glucose Monitoring: yes             Accucheck frequency: Daily             Fasting glucose: 170 - 200             Post prandial:             Evening:              Before meals: Taking Insulin?: yes             Long acting insulin: 16 units             Short acting insulin: 20 units with meals Blood Pressure Monitoring: rarely Retinal Examination: Up to Date Foot Exam: Up to Date Pneumovax: Up to Date Influenza: Up to Date Aspirin: yes   HYPERTENSION / HYPERLIPIDEMIA/CAD She is followed by cardiology, Dr. Sallyanne Kuster. Last saw 10/07/20 with changes to statin -- changed to Atorvastatin due to concerns for interaction with Simvastatin and Amlodipine. Monitored by cardiology due to subclavian artery stenosis and angina. Continues on Amlodipine, Plavix, HCTZ, Fish oil, Losartan.  Satisfied with current treatment? yes Duration of hypertension: chronic BP monitoring frequency: not checking BP range:not checking BP medication side effects: no Duration of hyperlipidemia:  chronic Cholesterol medication side effects: no Cholesterol supplements: fish oil Medication compliance: good compliance Aspirin: yes Recent stressors: no Recurrent headaches: no Visual changes: no Palpitations: no Dyspnea: no Chest pain: no Lower extremity edema: no Dizzy/lightheaded: no   CHRONIC KIDNEY DISEASE Last labs GFR 51 and CRT 1.06. CKD status: stable Medications renally dose: yes Previous renal evaluation: no Pneumovax:  Up to Date Influenza Vaccine:  Up to Date   CHRONIC JOINT PAIN On review is followed by rheumatology with diagnosis of polymyalgia rheumatica syndrome with a positive anti-CCP test.  Last seen 12/22/20, having infusions. Duration: chronic Involved joints: right hand, both feet, and left knee Mechanism of injury: arthritis Severity: 5/10 at worst Quality:  dull, aching and burning Frequency: intermittent Radiation: no Aggravating factors: walking and movement  Alleviating factors: Aspercreme and APAP  Status: fluctuating Treatments attempted: Tylenol and Aspercreme Relief with NSAIDs?:  No NSAIDs Taken Weakness with weight bearing or walking: no Sensation of giving way: no Locking: no Popping: no Bruising: no Swelling: no Redness: no Paresthesias/decreased sensation: no Fevers: no  ANXIETY/STRESS Taking Zoloft 150 MG daily.  Is taking Ativan 0.5  MG, takes this at night only, although ordered BID -- currently reports she takes 1 MG at night. Occasionally will take extra dose at day time, but not often, reports since last visit has had to not take extra as often after out discussion.  Pt aware of risks of benzo medication use to include increased sedation, respiratory suppression, falls, dependence and cardiovascular events.  Pt would like to continue treatment as benefit determined to outweigh risk.  On review PDMP last Ativan fill 10/25/20 for 3 months supply, previous PCP ordered this way. Duration:stable Anxious mood: yes  Excessive  worrying: no Irritability: no  Sweating: no Nausea: no Palpitations:no Hyperventilation: no Panic attacks: no Agoraphobia: no  Obscessions/compulsions: no Depressed mood: no Depression screen [facility] 2/9 10/13/2020 08/30/2020 07/07/2020 03/17/2020 12/17/2019  Decreased Interest 0 0 0 0 0  Down, Depressed, Hopeless 1 0 0 0 1  PHQ - 2 Score 1 0 0 0 1  Altered sleeping 1 - 0 0 3  Tired, decreased energy 0 - 0 0 1  Change in appetite 1 - 0 0 0  Feeling bad or failure about yourself  0 - 0 0 0  Trouble concentrating 0 - 0 0 0  Moving slowly or fidgety/restless 0 - 0 0 0  Suicidal thoughts 0 - 0 0 0  PHQ-9 Score 3 - 0 0 5  Difficult doing work/chores Not difficult at all - Not difficult at all Not difficult at all -  Some recent data might be hidden  Anhedonia: no Weight changes: no Insomnia: yes hard to fall asleep  Hypersomnia: no Fatigue/loss of energy: no Feelings of worthlessness: no Feelings of guilt: no Impaired concentration/indecisiveness: no Suicidal ideations: no  Crying spells: no Recent Stressors/Life Changes: no   Relationship problems: no   Family stress: no     Financial stress: no    Job stress: no    Recent death/loss: no GAD 7 : Generalized Anxiety Score 10/13/2020 03/17/2020 10/14/2019 12/19/2017  Nervous, Anxious, on Edge 0 0 3 3  Control/stop worrying 0 0 3 2  Worry too much - different things 0 0 3 3  Trouble relaxing 0 0 3 3  Restless 0 0 0 3  Easily annoyed or irritable 0 0 3 3  Afraid - awful might happen 0 0 3 2  Total GAD 7 Score 0 0 18 19  Anxiety Difficulty - Not difficult at all Somewhat difficult Very difficult   RECTAL BLEEDING Started Thursday or Friday of last week, was having BM and this present.  Only present with BM.  Notices it "right much amount in toilet".  This is bright red in color.  Last colonoscopy was 08/19/2019 -- had some non-bleeding internal hemorrhoids.  Is to return in 5 years for colonoscopy -- due to benign, but precancerous  polyps.  Endorses poor water intake at home.   Duration: for days Bright red rectal bleeding: yes  Amount of blood: moderate  Frequency: daily Melena: no  Spotting on toilet tissue: yes  Anal fullness: no  Perianal pain: no  Severity: none Perianal irritation/itching: no  Constipation: goes every day, but sometimes these are hard -- does strain on occasion  Chronic straining/valsava:  occasional  Anal trauma/intercourse: no  Hemorrhoids: yes  Previous colonoscopy: yes   Relevant past medical, surgical, family and social history reviewed and updated as indicated. Interim medical history since our last visit reviewed. Allergies and medications reviewed and updated.  Review of Systems  Constitutional: Negative for activity change,  appetite change, diaphoresis, fatigue and fever.  Respiratory: Negative for cough, chest tightness and shortness of breath.   Cardiovascular: Negative for chest pain, palpitations and leg swelling.  Gastrointestinal: Negative.   Endocrine: Negative for polydipsia, polyphagia and polyuria.  Neurological: Negative.   Psychiatric/Behavioral: Negative.     Per HPI unless specifically indicated above     Objective:    BP 133/81   Pulse 64   Temp 97.8 F (36.6 C) (Oral)   Wt 164 lb (74.4 kg)   SpO2 96%   BMI 27.29 kg/m   Wt Readings from Last 3 Encounters:  01/11/21 164 lb (74.4 kg)  10/13/20 166 lb 12.8 oz (75.7 kg)  10/07/20 167 lb (75.8 kg)    Physical Exam Vitals and nursing note reviewed.  Constitutional:      General: She is awake. She is not in acute distress.    Appearance: She is well-developed, well-groomed and overweight. She is not ill-appearing.  HENT:     Head: Normocephalic.     Right Ear: Hearing normal.     Left Ear: Hearing normal.  Eyes:     General: Lids are normal.        Right eye: No discharge.        Left eye: No discharge.     Conjunctiva/sclera: Conjunctivae normal.     Pupils: Pupils are equal, round, and  reactive to light.  Neck:     Thyroid: No thyromegaly.     Vascular: Carotid bruit (bilateral) present.  Cardiovascular:     Rate and Rhythm: Normal rate and regular rhythm.     Heart sounds: Normal heart sounds. No murmur heard. No gallop.   Pulmonary:     Effort: Pulmonary effort is normal. No accessory muscle usage or respiratory distress.     Breath sounds: Normal breath sounds.  Abdominal:     General: Bowel sounds are normal.     Palpations: Abdomen is soft.  Genitourinary:    Rectum: External hemorrhoid (x 2 noted external, no bleeding) and internal hemorrhoid present. No mass or tenderness. Normal anal tone.     Comments: Internal hemorrhoids noted on exam with firm stool palpated in rectal area.  No blood noted with exam. Musculoskeletal:     Cervical back: Normal range of motion and neck supple.     Right lower leg: No edema.     Left lower leg: No edema.  Skin:    General: Skin is warm and dry.  Neurological:     Mental Status: She is alert and oriented to person, place, and time.  Psychiatric:        Attention and Perception: Attention normal.        Mood and Affect: Mood normal.        Speech: Speech normal.        Behavior: Behavior normal. Behavior is cooperative.        Thought Content: Thought content normal.    Diabetic Foot Exam - Simple   Simple Foot Form Visual Inspection No deformities, no ulcerations, no other skin breakdown bilaterally: Yes Sensation Testing Intact to touch and monofilament testing bilaterally: Yes Pulse Check Posterior Tibialis and Dorsalis pulse intact bilaterally: Yes Comments    Results for orders placed or performed in visit on 10/13/20  Bayer DCA Hb A1c Waived  Result Value Ref Range   HB A1C (BAYER DCA - WAIVED) 8.0 (H) <8.8 %  Basic metabolic panel  Result Value Ref Range   Glucose 339 (H)  65 - 99 mg/dL   BUN 16 8 - 27 mg/dL   Creatinine, Ser 1.06 (H) 0.57 - 1.00 mg/dL   GFR calc non Af Amer 51 (L) >59 mL/min/1.73    GFR calc Af Amer 59 (L) >59 mL/min/1.73   BUN/Creatinine Ratio 15 12 - 28   Sodium 139 134 - 144 mmol/L   Potassium 4.3 3.5 - 5.2 mmol/L   Chloride 103 96 - 106 mmol/L   CO2 23 20 - 29 mmol/L   Calcium 9.3 8.7 - 10.3 mg/dL  Lipid Panel w/o Chol/HDL Ratio  Result Value Ref Range   Cholesterol, Total 161 100 - 199 mg/dL   Triglycerides 195 (H) 0 - 149 mg/dL   HDL 55 >39 mg/dL   VLDL Cholesterol Cal 32 5 - 40 mg/dL   LDL Chol Calc (NIH) 74 0 - 99 mg/dL      Assessment & Plan:   Problem List Items Addressed This Visit      Cardiovascular and Mediastinum   Hypertension associated with diabetes ([place])    Chronic, stable with BP at goal in office today.  Continue current medication regimen and collaboration with cardiology. CMP today.  Recommend she monitor BP at least three days a week at home and document for providers + bring to visits.  DASH diet focus.  Return in 3 months.      Relevant Orders   Bayer DCA Hb A1c Waived   Microalbumin, Urine Waived   Vitamin B12   Subclavian artery stenosis, left (HCC)    Stable, continue collaboration with cardiology and preventative treatment ASA, Plavix, and statin.      Coronary artery disease of bypass graft of native heart with stable angina pectoris [facility])    Continue collaboration with cardiology and preventative treatment.        Digestive   Rectal bleeding    Acute, suspect related to internal and external hemorrhoids.  Will send in Anusol.  Recommend she hold her ASA and Plavix for a few days, then restart.  Recommend [place] of Miralax daily and Colace as needed.  CBC in office today unremarkable, no anemia present.  Obtain CMP today and FOBT.  If ongoing or worsening send to GI.  Return in one week.      Relevant Orders   CBC With Differential/Platelet   Fecal occult blood, imunochemical     Endocrine   Hyperlipidemia associated with type 2 diabetes mellitus (HCC)    Chronic, ongoing.  Continue current medication regimen and  adjust as needed.  Lipid panel today.  Discussed with patient, if myalgias with Atorvastatin, + her history of myalgia with statins, could consider Repatha initiation if poor tolerance to new regimen.      Relevant Orders   Comprehensive metabolic panel   Bayer DCA Hb A1c Waived   Lipid Panel w/o Chol/HDL Ratio   CKD stage 3 due to type 2 diabetes mellitus (HCC)    Chronic, stable.  Continue Losartan for kidney protection.  Urine micro in office last visit ALB 30 and A:C <30. Continue collaboration with cardiology and endocrinology.  Consider nephrology referral if worsening function.  CMP today.      Relevant Orders   Bayer DCA Hb A1c Waived   Microalbumin, Urine Waived   Type 2 diabetes mellitus with hyperglycemia, with long-term current use of insulin (HCC) - Primary    Chronic, ongoing, followed by endocrinology with A1C today 8.2%.  Continue this collaboration and current medication regimen, defer changes to endo  at this time, + collaboration with CCM team in office -- is seeing them in March.  Would benefit from trial of SGLT in future (especially with cardiac health), will discuss with endocrinology and further discuss next visit with patient.  Bring Libre to next visit.  Return to office in 3 months.      Relevant Orders   Comprehensive metabolic panel   Bayer DCA Hb A1c Waived     Other   Anxiety disorder    Chronic, ongoing.  Denies SI/HI.  Continue Sertraline and Ativan.  Has had multiple attempts at reduction of benzo with no success.  Discussed at length risks with her and she is aware.  Continue Ativan and fill upon request.  PDMP reviewed.  Return in 3 months, has up to date UDS and contract.      Polymyalgia rheumatica (HCC)    Chronic, ongoing.  Continue collaboration with rheumatology and current medication regimen.        Depression, recurrent (HCC)    Chronic, ongoing.  Denies SI/HI.  Continue Sertraline  150 MG daily as is ordered, which she agrees to do. This  could benefit mood overall and help reduce benzo use.  Consider increase to 200 MG in future for increased benefit.  Return in 3 months.      Long-term current use of benzodiazepine    Chronic, ongoing for insomnia.  Has had trials of other medications and reductions with poor outcomes.  Fully aware of risks of chronic benzo use and aware this will be discussed each visit.  UDS up to date.      B12 deficiency    Ongoing, continue supplement and recheck level today.      Relevant Orders   Vitamin B12       Follow up plan: Return in about 1 week (around 01/18/2021) for Hemorrhoids with bleeding.

## 2021-01-11 NOTE — Assessment & Plan Note (Signed)
Ongoing, continue supplement and recheck level today. 

## 2021-01-11 NOTE — Assessment & Plan Note (Signed)
Chronic, ongoing for insomnia.  Has had trials of other medications and reductions with poor outcomes.  Fully aware of risks of chronic benzo use and aware this will be discussed each visit.  UDS up to date.

## 2021-01-11 NOTE — Assessment & Plan Note (Signed)
Chronic, ongoing.  Continue collaboration with rheumatology and current medication regimen.

## 2021-01-11 NOTE — Patient Instructions (Addendum)
Recommend drinking more water.  Start Miralax daily in morning and then may take Colace as needed.  Constipation, Adult Constipation is when a person has trouble pooping (having a bowel movement). When you have this condition, you may poop fewer than 3 times a week. Your poop (stool) may also be dry, hard, or bigger than normal. Follow these instructions at home: Eating and drinking  Eat foods that have a lot of fiber, such as: ? Fresh fruits and vegetables. ? Whole grains. ? Beans.  Eat less of foods that are low in fiber and high in fat and sugar, such as: ? [place] fries. ? Hamburgers. ? Cookies. ? Candy. ? Soda.  Drink enough fluid to keep your pee (urine) pale yellow.   General instructions  Exercise regularly or as told by your doctor. Try to do 150 minutes of exercise each week.  Go to the restroom when you feel like you need to poop. Do not hold it in.  Take over-the-counter and prescription medicines only as told by your doctor. These include any fiber supplements.  When you poop: ? Do deep breathing while relaxing your lower belly (abdomen). ? Relax your pelvic floor. The pelvic floor is a group of muscles that support the rectum, bladder, and intestines (as well as the uterus in women).  Watch your condition for any changes. Tell your doctor if you notice any.  Keep all follow-up visits as told by your doctor. This is important. Contact a doctor if:  You have pain that gets worse.  You have a fever.  You have not pooped for 4 days.  You vomit.  You are not hungry.  You lose weight.  You are bleeding from the opening of the butt (anus).  You have thin, pencil-like poop. Get help right away if:  You have a fever, and your symptoms suddenly get worse.  You leak poop or have blood in your poop.  Your belly feels hard or bigger than normal (bloated).  You have very bad belly pain.  You feel dizzy or you faint. Summary  Constipation is when a  person poops fewer than 3 times a week, has trouble pooping, or has poop that is dry, hard, or bigger than normal.  Eat foods that have a lot of fiber.  Drink enough fluid to keep your pee (urine) pale yellow.  Take over-the-counter and prescription medicines only as told by your doctor. These include any fiber supplements. This information is not intended to replace advice given to you by your health care provider. Make sure you discuss any questions you have with your health care provider. Document Revised: 10/01/2019 Document Reviewed: 10/01/2019 Elsevier Patient Education  2021 [place].   Hemorrhoids Hemorrhoids are swollen veins in and around the rectum or anus. There are two types of hemorrhoids:  Internal hemorrhoids. These occur in the veins that are just inside the rectum. They may poke through to the outside and become irritated and painful.  External hemorrhoids. These occur in the veins that are outside the anus and can be felt as a painful swelling or hard lump near the anus. Most hemorrhoids do not cause serious problems, and they can be managed with home treatments such as diet and lifestyle changes. If home treatments do not help the symptoms, procedures can be done to shrink or remove the hemorrhoids. What are the causes? This condition is caused by increased pressure in the anal area. This pressure may result from various things, including:  Constipation.  Straining to have a bowel movement.  Diarrhea.  Pregnancy.  Obesity.  Sitting for long periods of time.  Heavy lifting or other activity that causes you to strain.  Anal sex.  Riding a bike for a long period of time. What are the signs or symptoms? Symptoms of this condition include:  Pain.  Anal itching or irritation.  Rectal bleeding.  Leakage of stool (feces).  Anal swelling.  One or more lumps around the anus. How is this diagnosed? This condition can often be diagnosed through a  visual exam. Other exams or tests may also be done, such as:  An exam that involves feeling the rectal area with a gloved hand (digital rectal exam).  An exam of the anal canal that is done using a small tube (anoscope).  A blood test, if you have lost a significant amount of blood.  A test to look inside the colon using a flexible tube with a camera on the end (sigmoidoscopy or colonoscopy). How is this treated? This condition can usually be treated at home. However, various procedures may be done if dietary changes, lifestyle changes, and other home treatments do not help your symptoms. These procedures can help make the hemorrhoids smaller or remove them completely. Some of these procedures involve surgery, and others do not. Common procedures include:  Rubber band ligation. Rubber bands are placed at the base of the hemorrhoids to cut off their blood supply.  Sclerotherapy. Medicine is injected into the hemorrhoids to shrink them.  Infrared coagulation. A type of light energy is used to get rid of the hemorrhoids.  Hemorrhoidectomy surgery. The hemorrhoids are surgically removed, and the veins that supply them are tied off.  Stapled hemorrhoidopexy surgery. The surgeon staples the base of the hemorrhoid to the rectal wall. Follow these instructions at home: Eating and drinking  Eat foods that have a lot of fiber in them, such as whole grains, beans, nuts, fruits, and vegetables.  Ask your health care provider about taking products that have added fiber (fiber supplements).  Reduce the amount of fat in your diet. You can do this by eating low-fat dairy products, eating less red meat, and avoiding processed foods.  Drink enough fluid to keep your urine pale yellow.   Managing pain and swelling  Take warm sitz baths for 20 minutes, 3-4 times a day to ease pain and discomfort. You may do this in a bathtub or using a portable sitz bath that fits over the toilet.  If directed, apply  ice to the affected area. Using ice packs between sitz baths may be helpful. ? Put ice in a plastic bag. ? Place a towel between your skin and the bag. ? Leave the ice on for 20 minutes, 2-3 times a day.   General instructions  Take over-the-counter and prescription medicines only as told by your health care provider.  Use medicated creams or suppositories as told.  Get regular exercise. Ask your health care provider how much and what kind of exercise is best for you. In general, you should do moderate exercise for at least 30 minutes on most days of the week (150 minutes each week). This can include activities such as walking, biking, or yoga.  Go to the bathroom when you have the urge to have a bowel movement. Do not wait.  Avoid straining to have bowel movements.  Keep the anal area dry and clean. Use wet toilet paper or moist towelettes after a bowel movement.  Do not  sit on the toilet for long periods of time. This increases blood pooling and pain.  Keep all follow-up visits as told by your health care provider. This is important. Contact a health care provider if you have:  Increasing pain and swelling that are not controlled by treatment or medicine.  Difficulty having a bowel movement, or you are unable to have a bowel movement.  Pain or inflammation outside the area of the hemorrhoids. Get help right away if you have:  Uncontrolled bleeding from your rectum. Summary  Hemorrhoids are swollen veins in and around the rectum or anus.  Most hemorrhoids can be managed with home treatments such as diet and lifestyle changes.  Taking warm sitz baths can help ease pain and discomfort.  In severe cases, procedures or surgery can be done to shrink or remove the hemorrhoids. This information is not intended to replace advice given to you by your health care provider. Make sure you discuss any questions you have with your health care provider. Document Revised: 04/11/2019  Document Reviewed: 04/04/2018 Elsevier Patient Education  [place].

## 2021-01-11 NOTE — Assessment & Plan Note (Signed)
Chronic, ongoing.  Denies SI/HI.  Continue Sertraline and Ativan.  Has had multiple attempts at reduction of benzo with no success.  Discussed at length risks with her and she is aware.  Continue Ativan and fill upon request.  PDMP reviewed.  Return in 3 months, has up to date UDS and contract.

## 2021-01-12 LAB — COMPREHENSIVE METABOLIC PANEL
ALT: 22 IU/L (ref 0–32)
AST: 23 IU/L (ref 0–40)
Albumin/Globulin Ratio: 2 (ref 1.2–2.2)
Albumin: 4.4 g/dL (ref 3.7–4.7)
Alkaline Phosphatase: 146 IU/L — ABNORMAL HIGH (ref 44–121)
BUN/Creatinine Ratio: 14 (ref 12–28)
BUN: 14 mg/dL (ref 8–27)
Bilirubin Total: 0.3 mg/dL (ref 0.0–1.2)
CO2: 22 mmol/L (ref 20–29)
Calcium: 9.6 mg/dL (ref 8.7–10.3)
Chloride: 102 mmol/L (ref 96–106)
Creatinine, Ser: 0.99 mg/dL (ref 0.57–1.00)
GFR calc Af Amer: 64 mL/min/{1.73_m2} (ref >59)
GFR calc non Af Amer: 56 mL/min/{1.73_m2} — ABNORMAL LOW (ref >59)
Globulin, Total: 2.2 g/dL (ref 1.5–4.5)
Glucose: 187 mg/dL — ABNORMAL HIGH (ref 65–99)
Potassium: 4.1 mmol/L (ref 3.5–5.2)
Sodium: 141 mmol/L (ref 134–144)
Total Protein: 6.6 g/dL (ref 6.0–8.5)

## 2021-01-12 LAB — LIPID PANEL W/O CHOL/HDL RATIO
Cholesterol, Total: 165 mg/dL (ref 100–199)
HDL: 58 mg/dL (ref >39)
LDL Chol Calc (NIH): 77 mg/dL (ref 0–99)
Triglycerides: 181 mg/dL — ABNORMAL HIGH (ref 0–149)
VLDL Cholesterol Cal: 30 mg/dL (ref 5–40)

## 2021-01-12 LAB — VITAMIN B12: Vitamin B-12: >2000 pg/mL — ABNORMAL HIGH (ref 232–1245)

## 2021-01-12 NOTE — Progress Notes (Signed)
Contacted via [place] afternoon [place], your labs have returned and you continue to show some mild kidney disease but no worsening, in fact a little better this check.  Cholesterol levels show LDL close to goal, great job!! B12 level remains stable.  No changes needed at this time.  If any questions let me know.   Keep being awesome!!  Thank you for allowing me to participate in your care. Kindest regards, Tamula Morrical

## 2021-01-13 LAB — FECAL OCCULT BLOOD, IMMUNOCHEMICAL: Fecal Occult Bld: POSITIVE — AB

## 2021-01-13 NOTE — Progress Notes (Signed)
Please let Joylene know her stool did come back positive for blood, if she is still having issues I think it would be good to refer her to GI for further evaluation.  How is she doing?  Symptoms better?  Please let me know and if no improvement we will get in with GI.  Have a great day!! Keep being amazing!!  Thank you for allowing me to participate in your care. Kindest regards, Gwenette Wellons

## 2021-01-19 ENCOUNTER — Other Ambulatory Visit: Payer: Self-pay

## 2021-01-19 ENCOUNTER — Ambulatory Visit (INDEPENDENT_AMBULATORY_CARE_PROVIDER_SITE_OTHER): Payer: Medicare Other | Admitting: Nurse Practitioner

## 2021-01-19 ENCOUNTER — Encounter: Payer: Self-pay | Admitting: Nurse Practitioner

## 2021-01-19 VITALS — BP 120/80 | HR 69 | Temp 98.3°F | Wt 164.0 lb

## 2021-01-19 DIAGNOSIS — K625 Hemorrhage of anus and rectum: Secondary | ICD-10-CM

## 2021-01-19 DIAGNOSIS — I25708 Atherosclerosis of coronary artery bypass graft(s), unspecified, with other forms of angina pectoris: Secondary | ICD-10-CM

## 2021-01-19 DIAGNOSIS — M545 Low back pain, unspecified: Secondary | ICD-10-CM | POA: Diagnosis not present

## 2021-01-19 MED ORDER — HYDROCORTISONE ACETATE 25 MG RE SUPP: 25.0000 mg | Freq: Two times a day (BID) | RECTAL | 0 refills | Status: DC

## 2021-01-19 NOTE — Progress Notes (Signed)
BP 120/80   Pulse 69   Temp 98.3 F (36.8 C) (Oral)   Wt 164 lb (74.4 kg)   SpO2 94%   BMI 27.29 kg/m    Subjective:    Patient ID: Laura Rojas, female    DOB: 08-24-1946, 75 y.o.   MRN: [id number]  HPI: Laura Rojas is a 75 y.o. female  Chief Complaint  Patient presents with  . Follow-up    Pt states she have been dealing with right sided hip pain that comes and goes, but radiates from the hip down into leg, pt states when she has the pain she states it is a sharp pain and very sore.    RECTAL BLEEDING Follow- up for rectal bleeding first reported 01/11/21.  Was noted to have hemorrhoids on exam, but no active bleeding and CBC noted no anemia.  FOBT returned positive for blood and recommended GI visit, she wishes to hold off until this visit today.  Was prescribed Anusol on visit 01/11/21.  She reports that last bleeding episode was on 01/14/21.  No bleeding noticed since then even when she wipes.  She is using Miralax every day as instructed, which is helping soften stools.    Past visit: Started Thursday or Friday of last week, was having BM and this present.  Only present with BM.  Notices it "right much amount in toilet".  This is bright red in color.  Last colonoscopy was 08/19/2019 -- had some non-bleeding internal hemorrhoids.  Is to return in 5 years for colonoscopy -- due to benign, but precancerous polyps.  Endorses poor water intake at home.   Duration: for days Bright red rectal bleeding: yes  Amount of blood: moderate  Frequency: daily Melena: no  Spotting on toilet tissue: yes  Anal fullness: no  Perianal pain: no  Severity: none Perianal irritation/itching: no  Constipation: goes every day, but sometimes these are hard -- does strain on occasion  Chronic straining/valsava:  occasional  Anal trauma/intercourse: no  Hemorrhoids: yes  Previous colonoscopy: yes   BACK PAIN Reports to right side has posterior pain that runs down leg.  This has been present for  one month.   Duration: weeks Involved hip: right  Mechanism of injury: unknown Location: posterior Onset: gradual  Severity: 10/10  Quality: dull and aching, then sometimes sharp Frequency: intermittent Radiation: yes Aggravating factors: prolonged sitting   Alleviating factors: Aspercreme and APAP  Status: stable Treatments attempted: ASA and Aspercreme Relief with NSAIDs?: No NSAIDs Taken Weakness with weight bearing: no Weakness with walking: no Paresthesias / decreased sensation: no Swelling: no Redness:no Fevers: no  Relevant past medical, surgical, family and social history reviewed and updated as indicated. Interim medical history since our last visit reviewed. Allergies and medications reviewed and updated.  Review of Systems  Constitutional: Negative for activity change, appetite change, diaphoresis, fatigue and fever.  Respiratory: Negative for cough, chest tightness and shortness of breath.   Cardiovascular: Negative for chest pain, palpitations and leg swelling.  Gastrointestinal: Negative.   Endocrine: Negative for polydipsia, polyphagia and polyuria.  Neurological: Negative.   Psychiatric/Behavioral: Negative.     Per HPI unless specifically indicated above     Objective:    BP 120/80   Pulse 69   Temp 98.3 F (36.8 C) (Oral)   Wt 164 lb (74.4 kg)   SpO2 94%   BMI 27.29 kg/m   Wt Readings from Last 3 Encounters:  01/19/21 164 lb (74.4 kg)  01/11/21 164  lb (74.4 kg)  10/13/20 166 lb 12.8 oz (75.7 kg)    Physical Exam Vitals and nursing note reviewed.  Constitutional:      General: She is awake. She is not in acute distress.    Appearance: She is well-developed, well-groomed and overweight. She is not ill-appearing.  HENT:     Head: Normocephalic.     Right Ear: Hearing normal.     Left Ear: Hearing normal.  Eyes:     General: Lids are normal.        Right eye: No discharge.        Left eye: No discharge.     Conjunctiva/sclera:  Conjunctivae normal.     Pupils: Pupils are equal, round, and reactive to light.  Neck:     Thyroid: No thyromegaly.     Vascular: Carotid bruit (bilateral) present.  Cardiovascular:     Rate and Rhythm: Normal rate and regular rhythm.     Heart sounds: Normal heart sounds. No murmur heard. No gallop.   Pulmonary:     Effort: Pulmonary effort is normal. No accessory muscle usage or respiratory distress.     Breath sounds: Normal breath sounds.  Abdominal:     General: Bowel sounds are normal.     Palpations: Abdomen is soft.  Musculoskeletal:     Cervical back: Normal range of motion and neck supple.     Lumbar back: Tenderness (to lower glute) present. No swelling, edema, spasms or bony tenderness. Normal range of motion. Negative right straight leg raise test and negative left straight leg raise test.     Right lower leg: No edema.     Left lower leg: No edema.     Comments: Discomfort reported with extension lumbar spine, lateral right and rotation right.  Skin:    General: Skin is warm and dry.  Neurological:     Mental Status: She is alert and oriented to person, place, and time.  Psychiatric:        Attention and Perception: Attention normal.        Mood and Affect: Mood normal.        Speech: Speech normal.        Behavior: Behavior normal. Behavior is cooperative.        Thought Content: Thought content normal.    Results for orders placed or performed in visit on 01/11/21  Fecal occult blood, imunochemical   Specimen: Stool   ST  Result Value Ref Range   Fecal Occult Bld Positive (A) Negative  CBC With Differential/Platelet  Result Value Ref Range   WBC 6.0 3.4 - 10.8 x10E3/uL   RBC 4.01 3.77 - 5.28 x10E6/uL   Hemoglobin 11.6 11.1 - 15.9 g/dL   Hematocrit 35.4 34.0 - 46.6 %   MCV 88 79 - 97 fL   MCH 28.9 26.6 - 33.0 pg   MCHC 32.8 31.5 - 35.7 g/dL   RDW 15.1 11.7 - 15.4 %   Platelets 239 150 - 450 x10E3/uL   Neutrophils 61 Not Estab. %   Lymphs 30 Not  Estab. %   MID 9 Not Estab. %   Neutrophils Absolute 3.6 1.4 - 7.0 x10E3/uL   Lymphocytes Absolute 1.8 0.7 - 3.1 x10E3/uL   MID (Absolute) 0.6 0.1 - 1.6 X10E3/uL  Comprehensive metabolic panel  Result Value Ref Range   Glucose 187 (H) 65 - 99 mg/dL   BUN 14 8 - 27 mg/dL   Creatinine, Ser 0.99 0.57 - 1.00 mg/dL  GFR calc non Af Amer 56 (L) >59 mL/min/1.73   GFR calc Af Amer 64 >59 mL/min/1.73   BUN/Creatinine Ratio 14 12 - 28   Sodium 141 134 - 144 mmol/L   Potassium 4.1 3.5 - 5.2 mmol/L   Chloride 102 96 - 106 mmol/L   CO2 22 20 - 29 mmol/L   Calcium 9.6 8.7 - 10.3 mg/dL   Total Protein 6.6 6.0 - 8.5 g/dL   Albumin 4.4 3.7 - 4.7 g/dL   Globulin, Total 2.2 1.5 - 4.5 g/dL   Albumin/Globulin Ratio 2.0 1.2 - 2.2   Bilirubin Total 0.3 0.0 - 1.2 mg/dL   Alkaline Phosphatase 146 (H) 44 - 121 IU/L   AST 23 0 - 40 IU/L   ALT 22 0 - 32 IU/L  Bayer DCA Hb A1c Waived  Result Value Ref Range   HB A1C (BAYER DCA - WAIVED) 8.2 (H) <7.0 %  Microalbumin, Urine Waived  Result Value Ref Range   Microalb, Ur Waived 30 (H) 0 - 19 mg/L   Creatinine, Urine Waived 300 10 - 300 mg/dL   Microalb/Creat Ratio <30 <30 mg/g  Lipid Panel w/o Chol/HDL Ratio  Result Value Ref Range   Cholesterol, Total 165 100 - 199 mg/dL   Triglycerides 181 (H) 0 - 149 mg/dL   HDL 58 >39 mg/dL   VLDL Cholesterol Cal 30 5 - 40 mg/dL   LDL Chol Calc (NIH) 77 0 - 99 mg/dL  Vitamin B12  Result Value Ref Range   Vitamin B-12 >2000 (H) 232 - 1245 pg/mL      Assessment & Plan:   Problem List Items Addressed This Visit      Digestive   Rectal bleeding - Primary    Acute and 100% improved at this time.  Suspect related to hemorrhoids -- this has improved with daily Miralax reducing straining and Anusol as needed.  Recommend she continue to monitor closely and if return of symptoms then immediately notify provider.          Other   Lumbar back pain    Acute -- suspect this discomfort is coming more from lumbar  spine.  Will order imaging to further assess, suspect some degenerative changes.  Recommend she continue APAP as needed, avoid Ibuprofen products, and Aspercreme as needed.  Gentle stretching recommended.  May use heat.  Continue to follow-up with rheumatology for RA.  Return to office for worsening or ongoing -- if ongoing consider PT referral.        Relevant Orders   DG Lumbar Spine Complete       Follow up plan: Return in about 3 months (around 04/18/2021) for T2DM, HTN/HLD, MOOD, RA.

## 2021-01-19 NOTE — Assessment & Plan Note (Signed)
Acute and 100% improved at this time.  Suspect related to hemorrhoids -- this has improved with daily Miralax reducing straining and Anusol as needed.  Recommend she continue to monitor closely and if return of symptoms then immediately notify provider.

## 2021-01-19 NOTE — Patient Instructions (Signed)

## 2021-01-19 NOTE — Assessment & Plan Note (Signed)
Acute -- suspect this discomfort is coming more from lumbar spine.  Will order imaging to further assess, suspect some degenerative changes.  Recommend she continue APAP as needed, avoid Ibuprofen products, and Aspercreme as needed.  Gentle stretching recommended.  May use heat.  Continue to follow-up with rheumatology for RA.  Return to office for worsening or ongoing -- if ongoing consider PT referral.

## 2021-01-24 ENCOUNTER — Other Ambulatory Visit: Payer: Self-pay

## 2021-01-24 ENCOUNTER — Ambulatory Visit
Admission: RE | Admit: 2021-01-24 | Discharge: 2021-01-24 | Disposition: A | Discharge status: Home / Self Care | Payer: Medicare Other | Source: Ambulatory Visit | Attending: Nurse Practitioner | Admitting: Nurse Practitioner

## 2021-01-24 ENCOUNTER — Ambulatory Visit
Admission: RE | Admit: 2021-01-24 | Discharge: 2021-01-24 | Disposition: A | Discharge status: Home / Self Care | Payer: Medicare Other | Attending: Nurse Practitioner | Admitting: Nurse Practitioner

## 2021-01-24 DIAGNOSIS — M545 Low back pain, unspecified: Secondary | ICD-10-CM

## 2021-01-25 NOTE — Progress Notes (Signed)
Contacted via [place] afternoon Laura Rojas, your imaging has returned and did notice some anterolisthesis, meaning slippage of one disc onto another, mild in your case, but most likely cause of some of this back pain.  This can at times pinch a nerve and cause discomfort.  This is to your lower back at L4-L5.  There are also some degenerative changes noted.  How are you feeling?  Pain improved?  If ongoing pain could consider some physical therapy time which can help.   Keep being awesome!!  Thank you for allowing me to participate in your care. Kindest regards, Deon Duer

## 2021-01-27 DIAGNOSIS — N182 Chronic kidney disease, stage 2 (mild): Secondary | ICD-10-CM | POA: Diagnosis not present

## 2021-01-27 DIAGNOSIS — E1122 Type 2 diabetes mellitus with diabetic chronic kidney disease: Secondary | ICD-10-CM | POA: Diagnosis not present

## 2021-01-27 DIAGNOSIS — I152 Hypertension secondary to endocrine disorders: Secondary | ICD-10-CM | POA: Diagnosis not present

## 2021-01-27 DIAGNOSIS — E785 Hyperlipidemia, unspecified: Secondary | ICD-10-CM | POA: Diagnosis not present

## 2021-01-27 DIAGNOSIS — E1159 Type 2 diabetes mellitus with other circulatory complications: Secondary | ICD-10-CM | POA: Diagnosis not present

## 2021-01-27 DIAGNOSIS — E1169 Type 2 diabetes mellitus with other specified complication: Secondary | ICD-10-CM | POA: Diagnosis not present

## 2021-01-27 DIAGNOSIS — Z794 Long term (current) use of insulin: Secondary | ICD-10-CM | POA: Diagnosis not present

## 2021-02-22 DIAGNOSIS — M0609 Rheumatoid arthritis without rheumatoid factor, multiple sites: Secondary | ICD-10-CM | POA: Diagnosis not present

## 2021-03-02 ENCOUNTER — Telehealth: Payer: Self-pay

## 2021-03-02 NOTE — Chronic Care Management (AMB) (Signed)
Chronic Care Management Pharmacy Assistant   Name: Laura Rojas  MRN: [id number] DOB: 1946-01-27   Reason for Encounter: Disease State-DM    Recent office visits:  01/19/2021-[name], NP(PCP) 01/11/2021- [name], NP (PCP)  Recent consult visits:  01/27/21-[name] (Endocrinology) 01/226/2022- [name](Rheumatology)  Hospital visits:  None in previous 6 months  Medications: Outpatient Encounter Medications as of 03/02/2021  Medication Sig Note  . amLODipine (NORVASC) 2.5 MG tablet TAKE 1 TABLET BY MOUTH EVERY DAY   . Apoaequorin (PREVAGEN) 10 MG CAPS Take by mouth.   Marland Kitchen aspirin 81 MG tablet Take 81 mg by mouth daily.   Marland Kitchen atorvastatin (LIPITOR) 40 MG tablet Take 1 tablet (40 mg total) by mouth daily.   . BD PEN NEEDLE NANO U/F 32G X 4 MM MISC SMARTSIG:1 Each SUB-Q 4 Times Daily   . cholecalciferol (VITAMIN D3) 25 MCG (1000 UT) tablet Take 1,000 Units by mouth daily.   . clopidogrel (PLAVIX) 75 MG tablet Take 1 tablet (75 mg total) by mouth daily.   . Continuous Blood Gluc Sensor (FREESTYLE LIBRE 14 DAY SENSOR) MISC Use 1 kit every 14 (fourteen) days E11.65   . hydrochlorothiazide (MICROZIDE) 12.5 MG capsule TAKE 1 CAPSULE BY MOUTH EVERY DAY   . hydrocortisone (ANUSOL-HC) 25 MG suppository Place 1 suppository (25 mg total) rectally 2 (two) times daily.   Marland Kitchen inFLIXimab (REMICADE) 100 MG injection Inject 300 mg into the vein. For RA.Called patient. Advised patient of provider's approval for requested procedure, as well as any comments/instructions from provider.   Provided patient w/ verbal instructions concerning pre-, intra- and post-procedure preparation and instructions. Does not know dose. Next inusion 11/02/20 then q8 weeks   . Insulin Glargine (BASAGLAR KWIKPEN) 100 UNIT/ML SOPN Inject 20 Units into the skin at bedtime.    . Insulin Pen Needle (BD PEN NEEDLE NANO U/F) 32G X 4 MM MISC Use 1 each 4 (four) times daily   . LORazepam (ATIVAN) 0.5 MG  tablet TAKE 1 TAB BY MOUTH TWICE A DAY (USE THE ADDITIONAL 30 TABS ONLY SPARINGLY & ONLY FOR SEVERE ANXIETY)   . losartan (COZAAR) 100 MG tablet Take 1 tablet (100 mg total) by mouth daily.   . Magnesium 400 MG TABS Take 1 tablet by mouth daily.   . metFORMIN (GLUCOPHAGE) 500 MG tablet Take 500 mg by mouth 2 (two) times daily.    . Multiple Vitamin (MULTIVITAMIN) tablet Take 1 tablet by mouth daily.   Marland Kitchen NOVOLOG 100 UNIT/ML injection Inject 10 Units into the skin 3 (three) times daily. 05/11/2020: 18 units + sliding scale   . Omega-3 Fatty Acids (FISH OIL) 1200 MG CAPS Take 1,200 mg by mouth daily.   . pantoprazole (PROTONIX) 40 MG tablet Take 40 mg by mouth daily.   . sertraline (ZOLOFT) 100 MG tablet Take 1.5 tablets (150 mg total) by mouth daily.   . vitamin B-12 (CYANOCOBALAMIN) 1000 MCG tablet Take 1,000 mcg by mouth daily.    No facility-administered encounter medications on file as of 03/02/2021.   Recent Relevant Labs: Lab Results  Component Value Date/Time   HGBA1C 8.2 (H) 01/11/2021 09:18 AM   HGBA1C 8.0 (H) 10/13/2020 09:32 AM   HGBA1C 8.2 12/31/2019 12:00 AM   HGBA1C 8.8% 09/24/2019 12:00 AM   MICROALBUR 30 (H) 01/11/2021 09:18 AM   MICROALBUR 30 (H) 03/17/2020 10:39 AM    Kidney Function Lab Results  Component Value Date/Time   CREATININE 0.99 01/11/2021 09:20 AM   CREATININE  1.06 (H) 10/13/2020 09:34 AM   GFRNONAA 56 (L) 01/11/2021 09:20 AM   GFRAA 64 01/11/2021 09:20 AM    . Current antihyperglycemic regimen:  o Metformin 500 mg  o Basaglar 20 units o Novolog 10 units 3 times daily  . What recent interventions/DTPs have been made to improve glycemic control:  o none  . Have there been any recent hospitalizations or ED visits since last visit with CPP? No   . Patient reports hypoglycemic symptoms, including Sweaty, Shaky and Nervous/irritable   . Patient reports hyperglycemic symptoms, including fatigue and weakness   . How often are you checking your blood  sugar? Patient states she checks it about 7 times a day.  . What are your blood sugars ranging?  o Fasting: 110 o Before meals: 178 o After meals: 295 o Bedtime: na   . During the week, how often does your blood glucose drop below 70? Yes,  Patient states this happens sporatically  .  Marland Kitchen Are you checking your feet daily/regularly? Patient states  Adherence Review: Is the patient currently on a STATIN medication? Yes Is the patient currently on ACE/ARB medication? No Does the patient have >5 day gap between last estimated fill dates? No   Patient states she recently had an infusion which she had a reaction to. She states she was given prednisone and her sugar has been elevated.  [name] Rojas, [place]   Star Rating Drugs: Atorvastatin 40 mg last filled 12/21/20 90DS Metformin 500 mg last filled 12/08/20 90DS   [facility] Clinical Pharmacist Assistant [phone]

## 2021-03-07 ENCOUNTER — Telehealth: Payer: Self-pay

## 2021-03-07 NOTE — Telephone Encounter (Signed)
Pt verbalized understanding.

## 2021-03-07 NOTE — Telephone Encounter (Signed)
Lvm with this information

## 2021-03-07 NOTE — Telephone Encounter (Signed)
Copied from [place] [id number]. Topic: Appointment Scheduling - Scheduling Inquiry for Clinic >> Mar 07, 2021  9:19 AM Laura Rojas wrote: Reason for CRM: Pt scheduled an appt for 4.13.22 at 3pm/ Pt wanted to come in for her hip pain but also mentioned she has a cough from Bronchitis / Pt stated in her records she gets this a lot and asked if her appt can be in office/ pt has had her covid vaccines and booster/ please advise  Would this apt be in office or virtual?

## 2021-03-07 NOTE — Telephone Encounter (Signed)
Can be office visit, but I request she have a negative at home Covid test prior to visit, if negative she can come in.

## 2021-03-09 ENCOUNTER — Telehealth (INDEPENDENT_AMBULATORY_CARE_PROVIDER_SITE_OTHER): Payer: Medicare Other | Admitting: Nurse Practitioner

## 2021-03-09 ENCOUNTER — Encounter: Payer: Self-pay | Admitting: Nurse Practitioner

## 2021-03-09 VITALS — BP 138/63

## 2021-03-09 DIAGNOSIS — M545 Low back pain, unspecified: Secondary | ICD-10-CM | POA: Diagnosis not present

## 2021-03-09 DIAGNOSIS — R059 Cough, unspecified: Secondary | ICD-10-CM | POA: Diagnosis not present

## 2021-03-09 MED ORDER — PREDNISONE 20 MG PO TABS: 40.0000 mg | ORAL_TABLET | Freq: Every day | ORAL | 0 refills | Status: AC

## 2021-03-09 MED ORDER — GABAPENTIN (ONCE-DAILY) 300 MG PO TABS: 300.0000 mg | ORAL_TABLET | Freq: Every day | ORAL | 4 refills | Status: DC

## 2021-03-09 MED ORDER — BENZONATATE 200 MG PO CAPS: 200.0000 mg | ORAL_CAPSULE | Freq: Two times a day (BID) | ORAL | 1 refills | Status: DC | PRN

## 2021-03-09 MED ORDER — AMOXICILLIN-POT CLAVULANATE 875-125 MG PO TABS: 1.0000 | ORAL_TABLET | Freq: Two times a day (BID) | ORAL | 0 refills | Status: AC

## 2021-03-09 NOTE — Assessment & Plan Note (Signed)
Acute and ongoing with recent imaging showing minimal grade 1 anterolisthesis of L4-5 secondary to posterior facet joint hypertrophy.  Recommend she continue APAP as needed, avoid Ibuprofen products, and Aspercreme as needed.  Gentle stretching recommended.  May use heat.  Continue to follow-up with rheumatology for RA.  Will provide Prednisone 40 MG daily x 5 days for cough and pain + add on Gabapentin 300 MG QHS for now, goal is short term use only -- recent labs reviewed.  Referral to ortho for further evaluation and recommendations, may benefit from PT or injection.

## 2021-03-09 NOTE — Assessment & Plan Note (Signed)
Intermittent in fall and spring, suspect related to underlying allergic rhinitis with seasonal changes.  Current symptoms present for > 1 week.  Negative Covid test at home, but will obtain PCR Covid test at office for further evaluation due to current pandemic.  Recommend self quarantine until symptoms improved.  Will send in Augmentin x 7 days due to length of symptoms and concern for bacterial infection, Prednisone 40 MG x 5 days, and Tessalon for PRN use.  Recommend: - Increased rest - Increasing Fluids - Acetaminophen as needed for fever/pain.  - Salt water gargling, chloraseptic spray and throat lozenges - Diabetic Tussin as needed Return to office as scheduled or sooner if worsening or ongoing symptoms.

## 2021-03-09 NOTE — Patient Instructions (Signed)
Acute Back Pain, Adult Acute back pain is sudden and usually short-lived. It is often caused by an injury to the muscles and tissues in the back. The injury may result from:  A muscle or ligament getting overstretched or torn (strained). Ligaments are tissues that connect bones to each other. Lifting something improperly can cause a back strain.  Wear and tear (degeneration) of the spinal disks. Spinal disks are circular tissue that provide cushioning between the bones of the spine (vertebrae).  Twisting motions, such as while playing sports or doing yard work.  A hit to the back.  Arthritis. You may have a physical exam, lab tests, and imaging tests to find the cause of your pain. Acute back pain usually goes away with rest and home care. Follow these instructions at home: Managing pain, stiffness, and swelling  Treatment may include medicines for pain and inflammation that are taken by mouth or applied to the skin, prescription pain medicine, or muscle relaxants. Take over-the-counter and prescription medicines only as told by your health care provider.  Your health care provider may recommend applying ice during the first 24-48 hours after your pain starts. To do this: ? Put ice in a plastic bag. ? Place a towel between your skin and the bag. ? Leave the ice on for 20 minutes, 2-3 times a day.  If directed, apply heat to the affected area as often as told by your health care provider. Use the heat source that your health care provider recommends, such as a moist heat pack or a heating pad. ? Place a towel between your skin and the heat source. ? Leave the heat on for 20-30 minutes. ? Remove the heat if your skin turns bright red. This is especially important if you are unable to feel pain, heat, or cold. You have a greater risk of getting burned. Activity  Do not stay in bed. Staying in bed for more than 1-2 days can delay your recovery.  Sit up and stand up straight. Avoid leaning  forward when you sit or hunching over when you stand. ? If you work at a desk, sit close to it so you do not need to lean over. Keep your chin tucked in. Keep your neck drawn back, and keep your elbows bent at a 90-degree angle (right angle). ? Sit high and close to the steering wheel when you drive. Add lower back (lumbar) support to your car seat, if needed.  Take short walks on even surfaces as soon as you are able. Try to increase the length of time you walk each day.  Do not sit, drive, or stand in one place for more than 30 minutes at a time. Sitting or standing for long periods of time can put stress on your back.  Do not drive or use heavy machinery while taking prescription pain medicine.  Use proper lifting techniques. When you bend and lift, use positions that put less stress on your back: ? Bend your knees. ? Keep the load close to your body. ? Avoid twisting.  Exercise regularly as told by your health care provider. Exercising helps your back heal faster and helps prevent back injuries by keeping muscles strong and flexible.  Work with a physical therapist to make a safe exercise program, as recommended by your health care provider. Do any exercises as told by your physical therapist.   Lifestyle  Maintain a healthy weight. Extra weight puts stress on your back and makes it difficult to have   good posture.  Avoid activities or situations that make you feel anxious or stressed. Stress and anxiety increase muscle tension and can make back pain worse. Learn ways to manage anxiety and stress, such as through exercise. General instructions  Sleep on a firm mattress in a comfortable position. Try lying on your side with your knees slightly bent. If you lie on your back, put a pillow under your knees.  Follow your treatment plan as told by your health care provider. This may include: ? Cognitive or behavioral therapy. ? Acupuncture or massage therapy. ? Meditation or yoga. Contact  a health care provider if:  You have pain that is not relieved with rest or medicine.  You have increasing pain going down into your legs or buttocks.  Your pain does not improve after 2 weeks.  You have pain at night.  You lose weight without trying.  You have a fever or chills. Get help right away if:  You develop new bowel or bladder control problems.  You have unusual weakness or numbness in your arms or legs.  You develop nausea or vomiting.  You develop abdominal pain.  You feel faint. Summary  Acute back pain is sudden and usually short-lived.  Use proper lifting techniques. When you bend and lift, use positions that put less stress on your back.  Take over-the-counter and prescription medicines and apply heat or ice as directed by your health care provider. This information is not intended to replace advice given to you by your health care provider. Make sure you discuss any questions you have with your health care provider. Document Revised: 08/06/2020 Document Reviewed: 08/06/2020 Elsevier Patient Education  2021 [employer]  

## 2021-03-09 NOTE — Progress Notes (Signed)
BP 138/63    Subjective:    Patient ID: Laura Rojas, female    DOB: 06-Mar-1946, 75 y.o.   MRN: [id number]  HPI: Laura Rojas is a 75 y.o. female  Chief Complaint  Patient presents with  . Hip Pain    Patient states she has always had issues with  right side hip pain but it has gotten worse lately and radiates down her leg.  . Cough    Patient stated her cough started about a week ago and she took an at-home covid test and it was negative. Patient states she is just coughing all day and when she lies her head down at night is when it was worse. Patient states she is coughing up a yellow mucus. Patient has taken OTC cough syrup. Patient denies it helping.    . This visit was completed via telephone due to the restrictions of the COVID-19 pandemic. All issues as above were discussed and addressed but no physical exam was performed. If it was felt that the patient should be evaluated in the office, they were directed there. The patient verbally consented to this visit. Patient was unable to complete an audio/visual visit due to Lack of equipment. Due to the catastrophic nature of the COVID-19 pandemic, this visit was done through audio contact only. . Location of the patient: home . Location of the provider: home . Those involved with this call:  . Provider: [name], DNP . CMA: Irena Reichmann, CMA . Front Desk/Registration: Jill Side  . Time spent on call: 21 minutes on the phone discussing health concerns. 15 minutes total spent in review of patient's record and preparation of their chart.  . I verified patient identity using two factors (patient name and date of birth). Patient consents verbally to being seen via telemedicine visit today.    RIGHT HIP AND LOWER BACK PAIN Reports to right side has posterior pain that runs down leg.  This has been present for two months.  Not currently on Prednisone.  Was seen for similar at end of February this year -- imaging done lower  back "Minimal grade 1 anterolisthesis of L4-5 secondary to posterior facet joint hypertrophy". Duration: weeks Involved hip: right  Mechanism of injury: unknown Location: posterior Onset: gradual  Severity: 10/10  Quality: dull and aching, then sometimes sharp Frequency: intermittent Radiation: yes Aggravating factors: prolonged sitting   Alleviating factors: Aspercreme and APAP  Status: stable Treatments attempted: Tylenol and Aspercreme Relief with NSAIDs?: No NSAIDs Taken Weakness with weight bearing: yes Weakness with walking: yes Paresthesias / decreased sensation: no Swelling: no Redness:no Fevers: no  UPPER RESPIRATORY TRACT INFECTION Started about one week ago, always has had bronchitis or cough this time of year.  Lasts all night long.  Did home Covid test last night and this was negative.  Is vaccinated. Fever: no Cough: yes Shortness of breath: no Wheezing: no Chest pain: no Chest tightness: yes Chest congestion: no Nasal congestion: yes Runny nose: yes Post nasal drip: yes Sneezing: no Sore throat: no Swollen glands: no Sinus pressure: no Headache: no Face pain: no Toothache: no Ear pain: none Ear pressure: none Eyes red/itching:no Eye drainage/crusting: no  Vomiting: no Rash: no Fatigue: yes Sick contacts: no Strep contacts: no  Context: stable Recurrent sinusitis: no Relief with OTC cold/cough medications: yes  Treatments attempted: cold/sinus   Relevant past medical, surgical, family and social history reviewed and updated as indicated. Interim medical history since our last visit reviewed. Allergies  and medications reviewed and updated.  Review of Systems  Constitutional: Positive for fatigue. Negative for activity change, appetite change, diaphoresis and fever.  HENT: Positive for congestion, postnasal drip and rhinorrhea. Negative for ear pain, sinus pressure, sinus pain, sneezing and sore throat.   Respiratory: Positive for cough.  Negative for chest tightness, shortness of breath and wheezing.   Cardiovascular: Negative for chest pain, palpitations and leg swelling.  Gastrointestinal: Negative.   Endocrine: Negative for polydipsia, polyphagia and polyuria.  Musculoskeletal: Positive for arthralgias.  Neurological: Negative.   Psychiatric/Behavioral: Negative.     Per HPI unless specifically indicated above     Objective:    BP 138/63   Wt Readings from Last 3 Encounters:  01/19/21 164 lb (74.4 kg)  01/11/21 164 lb (74.4 kg)  10/13/20 166 lb 12.8 oz (75.7 kg)    Physical Exam   Unable to perform due to telephone visit only.  Frequent dry, hacking cough noted during visit, no SOB with talking noted.  Results for orders placed or performed in visit on 01/11/21  Fecal occult blood, imunochemical   Specimen: Stool   ST  Result Value Ref Range   Fecal Occult Bld Positive (A) Negative  CBC With Differential/Platelet  Result Value Ref Range   WBC 6.0 3.4 - 10.8 x10E3/uL   RBC 4.01 3.77 - 5.28 x10E6/uL   Hemoglobin 11.6 11.1 - 15.9 g/dL   Hematocrit 35.4 34.0 - 46.6 %   MCV 88 79 - 97 fL   MCH 28.9 26.6 - 33.0 pg   MCHC 32.8 31.5 - 35.7 g/dL   RDW 15.1 11.7 - 15.4 %   Platelets 239 150 - 450 x10E3/uL   Neutrophils 61 Not Estab. %   Lymphs 30 Not Estab. %   MID 9 Not Estab. %   Neutrophils Absolute 3.6 1.4 - 7.0 x10E3/uL   Lymphocytes Absolute 1.8 0.7 - 3.1 x10E3/uL   MID (Absolute) 0.6 0.1 - 1.6 X10E3/uL  Comprehensive metabolic panel  Result Value Ref Range   Glucose 187 (H) 65 - 99 mg/dL   BUN 14 8 - 27 mg/dL   Creatinine, Ser 0.99 0.57 - 1.00 mg/dL   GFR calc non Af Amer 56 (L) >59 mL/min/1.73   GFR calc Af Amer 64 >59 mL/min/1.73   BUN/Creatinine Ratio 14 12 - 28   Sodium 141 134 - 144 mmol/L   Potassium 4.1 3.5 - 5.2 mmol/L   Chloride 102 96 - 106 mmol/L   CO2 22 20 - 29 mmol/L   Calcium 9.6 8.7 - 10.3 mg/dL   Total Protein 6.6 6.0 - 8.5 g/dL   Albumin 4.4 3.7 - 4.7 g/dL   Globulin,  Total 2.2 1.5 - 4.5 g/dL   Albumin/Globulin Ratio 2.0 1.2 - 2.2   Bilirubin Total 0.3 0.0 - 1.2 mg/dL   Alkaline Phosphatase 146 (H) 44 - 121 IU/L   AST 23 0 - 40 IU/L   ALT 22 0 - 32 IU/L  Bayer DCA Hb A1c Waived  Result Value Ref Range   HB A1C (BAYER DCA - WAIVED) 8.2 (H) <7.0 %  Microalbumin, Urine Waived  Result Value Ref Range   Microalb, Ur Waived 30 (H) 0 - 19 mg/L   Creatinine, Urine Waived 300 10 - 300 mg/dL   Microalb/Creat Ratio <30 <30 mg/g  Lipid Panel w/o Chol/HDL Ratio  Result Value Ref Range   Cholesterol, Total 165 100 - 199 mg/dL   Triglycerides 181 (H) 0 - 149 mg/dL  HDL 58 >39 mg/dL   VLDL Cholesterol Cal 30 5 - 40 mg/dL   LDL Chol Calc (NIH) 77 0 - 99 mg/dL  Vitamin B12  Result Value Ref Range   Vitamin B-12 >2000 (H) 232 - 1245 pg/mL      Assessment & Plan:   Problem List Items Addressed This Visit      Other   Cough    Intermittent in fall and spring, suspect related to underlying allergic rhinitis with seasonal changes.  Current symptoms present for > 1 week.  Negative Covid test at home, but will obtain PCR Covid test at office for further evaluation due to current pandemic.  Recommend self quarantine until symptoms improved.  Will send in Augmentin x 7 days due to length of symptoms and concern for bacterial infection, Prednisone 40 MG x 5 days, and Tessalon for PRN use.  Recommend: - Increased rest - Increasing Fluids - Acetaminophen as needed for fever/pain.  - Salt water gargling, chloraseptic spray and throat lozenges - Diabetic Tussin as needed Return to office as scheduled or sooner if worsening or ongoing symptoms.      Relevant Orders   Novel Coronavirus, NAA ([facility])   Lumbar back pain - Primary    Acute and ongoing with recent imaging showing minimal grade 1 anterolisthesis of L4-5 secondary to posterior facet joint hypertrophy.  Recommend she continue APAP as needed, avoid Ibuprofen products, and Aspercreme as needed.  Gentle  stretching recommended.  May use heat.  Continue to follow-up with rheumatology for RA.  Will provide Prednisone 40 MG daily x 5 days for cough and pain + add on Gabapentin 300 MG QHS for now, goal is short term use only -- recent labs reviewed.  Referral to ortho for further evaluation and recommendations, may benefit from PT or injection.        Relevant Medications   predniSONE (DELTASONE) 20 MG tablet   Other Relevant Orders   Ambulatory referral to Orthopedics      I discussed the assessment and treatment plan with the patient. The patient was provided an opportunity to ask questions and all were answered. The patient agreed with the plan and demonstrated an understanding of the instructions.   The patient was advised to call back or seek an in-person evaluation if the symptoms worsen or if the condition fails to improve as anticipated.   I provided 21+ minutes of time during this encounter.  Follow up plan: Return if symptoms worsen or fail to improve.

## 2021-03-10 DIAGNOSIS — R059 Cough, unspecified: Secondary | ICD-10-CM | POA: Diagnosis not present

## 2021-03-10 NOTE — Addendum Note (Signed)
Addended by: Georgina Peer on: 03/10/2021 04:33 PM   Modules accepted: Orders

## 2021-03-11 LAB — NOVEL CORONAVIRUS, NAA: SARS-CoV-2, NAA: NOT DETECTED

## 2021-03-11 LAB — SARS-COV-2, NAA 2 DAY TAT

## 2021-03-13 NOTE — Progress Notes (Signed)
Please let Laura Rojas know her Covid testing returned negative, however if still not feeling well I do recommend quarantine until symptoms have improved and continue current treatment.  Return to office if worsening or ongoing.

## 2021-03-15 ENCOUNTER — Telehealth: Payer: Self-pay

## 2021-03-15 MED ORDER — HYDROCOD POLST-CPM POLST ER 10-8 MG/5ML PO SUER: 5.0000 mL | Freq: Two times a day (BID) | ORAL | 0 refills | Status: DC | PRN

## 2021-03-15 NOTE — Addendum Note (Signed)
Addended by: [name] T on: 03/15/2021 04:13 PM   Modules accepted: Orders

## 2021-03-15 NOTE — Telephone Encounter (Signed)
Would prefer she continue to use Tessalon and over the counter Diabetic Tussin as needed, as Tussionex has less recommendations for her age group and could cause some drowsiness leading to fall risk.  However, if has used this without issue in past then would consider small supply if she feels this would be beneficial.

## 2021-03-15 NOTE — Telephone Encounter (Signed)
Pt. Prefers Tussionex she states she is aware that it will make her drowsy but it works better and she will take at bedtime.

## 2021-03-15 NOTE — Telephone Encounter (Signed)
Called pt to relay Jolene's message no answer left vm  

## 2021-03-15 NOTE — Telephone Encounter (Signed)
Sent in short course only.

## 2021-03-15 NOTE — Telephone Encounter (Signed)
Please advise pt seen 4/13  Copied from [place] #[id number]. Topic: General - Inquiry >> Mar 15, 2021  1:34 PM Greggory Keen D wrote: Reason for CRM: Pt called saying she had an appt with Jolene a last week.  She is still having a cough at night and wants to know if she will send in tusinex to the pharmacy.  [facility]

## 2021-03-17 DIAGNOSIS — M545 Low back pain, unspecified: Secondary | ICD-10-CM | POA: Diagnosis not present

## 2021-03-17 DIAGNOSIS — M533 Sacrococcygeal disorders, not elsewhere classified: Secondary | ICD-10-CM | POA: Diagnosis not present

## 2021-03-22 DIAGNOSIS — M5459 Other low back pain: Secondary | ICD-10-CM | POA: Diagnosis not present

## 2021-03-22 DIAGNOSIS — M25551 Pain in right hip: Secondary | ICD-10-CM | POA: Diagnosis not present

## 2021-03-22 DIAGNOSIS — M5416 Radiculopathy, lumbar region: Secondary | ICD-10-CM | POA: Diagnosis not present

## 2021-03-26 ENCOUNTER — Other Ambulatory Visit: Payer: Self-pay | Admitting: Nurse Practitioner

## 2021-03-26 NOTE — Telephone Encounter (Signed)
Requested medication (s) are due for refill today: no- not due until 04/10/21  Requested medication (s) are on the active medication list: yes  Last refill:  01/11/21  Future visit scheduled: yes  Notes to clinic:  med not delegated to NT to RF   Requested Prescriptions  Pending Prescriptions Disp Refills   LORazepam (ATIVAN) 0.5 MG tablet [Pharmacy Med Name: LORAZEPAM 0.5 MG TABLET] 180 tablet 0    Sig: TAKE 1 TAB BY MOUTH TWICE A DAY (USE THE ADDITIONAL 30 TABS ONLY SPARINGLY & ONLY FOR SEVERE ANXIETY)      Not Delegated - Psychiatry:  Anxiolytics/Hypnotics Failed - 03/26/2021 10:22 AM      Failed - This refill cannot be delegated      Failed - Urine Drug Screen completed in last 360 days      Passed - Valid encounter within last 6 months    Recent Outpatient Visits           2 weeks ago Lumbar back pain   [place], Foyil T, NP   2 months ago Rectal bleeding   [place], [place] T, NP   2 months ago Type 2 diabetes mellitus with hyperglycemia, with long-term current use of insulin ([place])   [place], Jolene T, NP   5 months ago Type 2 diabetes mellitus with hyperglycemia, with long-term current use of insulin ([place])   [place], Jolene T, NP   8 months ago Type 2 diabetes mellitus with hyperglycemia, with long-term current use of insulin ([place])   [place], Barbaraann Faster, NP       Future Appointments             In 1 month Cannady, Barbaraann Faster, NP [facility]   In 5 months  [facility]

## 2021-03-28 NOTE — Telephone Encounter (Signed)
PDMP reviewed and last fill 01/22/21 for 90 day supply

## 2021-03-28 NOTE — Telephone Encounter (Signed)
Scheduled 6/2

## 2021-03-29 DIAGNOSIS — M533 Sacrococcygeal disorders, not elsewhere classified: Secondary | ICD-10-CM | POA: Diagnosis not present

## 2021-03-29 DIAGNOSIS — M25551 Pain in right hip: Secondary | ICD-10-CM | POA: Diagnosis not present

## 2021-04-04 ENCOUNTER — Telehealth: Payer: Self-pay

## 2021-04-04 ENCOUNTER — Telehealth: Payer: Self-pay | Admitting: Pharmacist

## 2021-04-04 NOTE — Progress Notes (Deleted)
Chronic Care Management Pharmacy Note  04/04/2021 Name:  Laura Rojas MRN:  [id number] DOB:  24-Apr-1946  Subjective: Laura Rojas is an 75 y.o. year old female who is a primary patient of Cannady, Barbaraann Faster, NP.  The CCM team was consulted for assistance with disease management and care coordination needs.    Engaged with patient by telephone for follow up visit in response to provider referral for pharmacy case management and/or care coordination services.   Consent to Services:  The patient was given information about Chronic Care Management services, agreed to services, and gave verbal consent prior to initiation of services.  Please see initial visit note for detailed documentation.   Patient Care Team: Venita Lick, NP as PCP - General (Nurse Practitioner) Marlowe Sax, MD as Referring Physician (Internal Medicine) Warnell Forester, NP as Nurse Practitioner (Surgery)  Recent office visits: 4/13/22[facility] (PCP) telehealth- COVID test negative, ortho referral, augmentin, prednisone 40 mg x5, tessalon- Tussionex 2/23/22[facility] Card (PCP)- back imaging ordered-l4-l5 anterolisthesis, rectal bleeding resolved 2/15/22[facility] (PCP)- blood work,  Fecal occult blood positive- hemoorhooid   Recent consult visits: 3/29/22[facility] [place] (rheumatology)- Remicade infusion, completed [facility] benefits on line 01/27/21-[place] (endocrinology)- increase Basaglar to 18 u, Novolog 20 u AC meals with SS 2-12 units for bg 150->401, freestyle avg bg 179, FBG 87-228, lunch 77-248, dinner 84-251, bedtime 156-356  Hospital visits: {Hospital DC Yes/No:25215}  Objective:  Lab Results  Component Value Date   CREATININE 0.99 01/11/2021   BUN 14 01/11/2021   GFRNONAA 56 (L) 01/11/2021   GFRAA 64 01/11/2021   NA 141 01/11/2021   K 4.1 01/11/2021   CALCIUM 9.6 01/11/2021   CO2 22 01/11/2021   GLUCOSE 187 (H) 01/11/2021    Lab Results  Component Value Date/Time   HGBA1C 8.2 (H)  01/11/2021 09:18 AM   HGBA1C 8.0 (H) 10/13/2020 09:32 AM   HGBA1C 8.2 12/31/2019 12:00 AM   HGBA1C 8.8% 09/24/2019 12:00 AM   MICROALBUR 30 (H) 01/11/2021 09:18 AM   MICROALBUR 30 (H) 03/17/2020 10:39 AM    Last diabetic Eye exam:  Lab Results  Component Value Date/Time   HMDIABEYEEXA No Retinopathy 02/26/2020 12:00 AM   HMDIABEYEEXA No Retinopathy 02/26/2020 12:00 AM    Last diabetic Foot exam:  Lab Results  Component Value Date/Time   HMDIABFOOTEX Normal 05/07/2018 12:00 AM     Lab Results  Component Value Date   CHOL 165 01/11/2021   HDL 58 01/11/2021   LDLCALC 77 01/11/2021   TRIG 181 (H) 01/11/2021   CHOLHDL 2.9 08/28/2018    Hepatic Function Latest Ref Rng & Units 01/11/2021 12/17/2019 05/08/2019  Total Protein 6.0 - 8.5 g/dL 6.6 6.4 -  Albumin 3.7 - 4.7 g/dL 4.4 4.7 -  AST 0 - 40 IU/L _0 ALT 0 - 32 IU/L _1 Alk Phosphatase 44 - 121 IU/L 146(H) 150(H) -  Total Bilirubin 0.0 - 1.2 mg/dL 0.3 0.3 -    Lab Results  Component Value Date/Time   TSH 2.660 07/07/2020 02:02 PM   TSH 2.990 12/17/2019 01:26 PM    CBC Latest Ref Rng & Units 01/11/2021 12/17/2019 08/28/2018  WBC 3.4 - 10.8 x10E3/uL 6.0 5.1 7.4  Hemoglobin 11.1 - 15.9 g/dL 11.6 12.6 12.9  Hematocrit 34.0 - 46.6 % 35.4 39.2 38.8  Platelets 150 - 450 x10E3/uL 239 262 255    Lab Results  Component Value Date/Time   VD25OH 32.0 12/17/2019 01:26 PM  Clinical ASCVD: Yes  The 10-year ASCVD risk score Mikey Bussing DC Jr., et al., 2013) is: 40.8%   Values used to calculate the score:     Age: 62 years     Sex: Female     Is Non-Hispanic African American: No     Diabetic: Yes     Tobacco smoker: No     Systolic Blood Pressure: 810 mmHg     Is BP treated: Yes     HDL Cholesterol: 58 mg/dL     Total Cholesterol: 165 mg/dL    Depression screen [facility] 2/9 01/19/2021 10/13/2020 08/30/2020  Decreased Interest 0 0 0  Down, Depressed, Hopeless 0 1 0  PHQ - 2 Score 0 1 0  Altered sleeping 3 1 -  Tired,  decreased energy 2 0 -  Change in appetite 0 1 -  Feeling bad or failure about yourself  0 0 -  Trouble concentrating 0 0 -  Moving slowly or fidgety/restless 0 0 -  Suicidal thoughts 0 0 -  PHQ-9 Score 5 3 -  Difficult doing work/chores - Not difficult at all -  Some recent data might be hidden     ***Other: (CHADS2VASc if Afib, MMRC or CAT for COPD, ACT, DEXA)  Social History   Tobacco Use  Smoking Status Never Smoker  Smokeless Tobacco Never Used   BP Readings from Last 3 Encounters:  03/09/21 138/63  01/19/21 120/80  01/11/21 133/81   Pulse Readings from Last 3 Encounters:  01/19/21 69  01/11/21 64  10/13/20 64   Wt Readings from Last 3 Encounters:  01/19/21 164 lb (74.4 kg)  01/11/21 164 lb (74.4 kg)  10/13/20 166 lb 12.8 oz (75.7 kg)   BMI Readings from Last 3 Encounters:  01/19/21 27.29 kg/m  01/11/21 27.29 kg/m  10/13/20 27.76 kg/m    Assessment/Interventions: Review of patient past medical history, allergies, medications, health status, including review of consultants reports, laboratory and other test data, was performed as part of comprehensive evaluation and provision of chronic care management services.   SDOH:  (Social Determinants of Health) assessments and interventions performed: {yes/no:20286}  SDOH Screenings   Alcohol Screen: Not on file  Depression (PHQ2-9): Medium Risk  . PHQ-2 Score: 5  Financial Resource Strain: Low Risk   . Difficulty of Paying Living Expenses: Not hard at all  Food Insecurity: No Food Insecurity  . Worried About Charity fundraiser in the Last Year: Never true  . Ran Out of Food in the Last Year: Never true  Housing: Not on file  Physical Activity: Inactive  . Days of Exercise per Week: 0 days  . Minutes of Exercise per Session: 0 min  Social Connections: Not on file  Stress: No Stress Concern Present  . Feeling of Stress : Not at all  Tobacco Use: Low Risk   . Smoking Tobacco Use: Never Smoker  . Smokeless  Tobacco Use: Never Used  Transportation Needs: No Transportation Needs  . Lack of Transportation (Medical): No  . Lack of Transportation (Non-Medical): No      Immunization History  Administered Date(s) Administered  . Influenza, High Dose Seasonal PF 09/04/2016, 08/23/2017, 08/28/2018, 07/14/2019, 09/03/2020  . Influenza,inj,Quad PF,6+ Mos 09/27/2015  . Influenza-Unspecified 09/18/2014  . PFIZER(Purple Top)SARS-COV-2 Vaccination 12/04/2019, 12/25/2019, 09/01/2020  . Pneumococcal Conjugate-13 04/22/2014  . Pneumococcal Polysaccharide-23 11/12/2008, 07/05/2016  . Td 12/21/2009, 03/17/2020  . Zoster Recombinat (Shingrix) 07/14/2019, 10/15/2019    Conditions to be addressed/monitored:  Hypertension, Hyperlipidemia, Diabetes, Coronary Artery  Disease, GERD, Chronic Kidney Disease, Depression, Anxiety and Rheumatoid arthritis  There are no care plans that you recently modified to display for this patient.    Medication Assistance: {MEDASSISTANCEINFO:25044}  [id number] [address] [id number]: [phone]: [phone] Uses pill box? {Yes or If no, why not?:20788} Pt endorses ***% compliance  We discussed: {Pharmacy options:24294} Patient decided to: {US Pharmacy Plan:23885}  Care Plan and Follow Up Patient Decision:  {FOLLOWUP:24991}  Plan: {CM FOLLOW UP PLAN:25073}  ***

## 2021-04-07 NOTE — Telephone Encounter (Signed)
  Chronic Care Management   Outreach Note  04/07/2021 Name: Laura Rojas MRN: [id number] DOB: 1946/05/19  Referred by: Venita Lick, NP Reason for referral : Chronic Care Management   An unsuccessful telephone outreach was attempted today. The patient was referred to the pharmacist for assistance with care management and care coordination.   Follow Up Plan: Will ask CPA to reschedule with me at next assessment call.  Junita Push. Kenton Kingfisher PharmD, [place] [facility] [place] [phone]

## 2021-04-20 ENCOUNTER — Ambulatory Visit: Payer: Medicare Other | Admitting: Nurse Practitioner

## 2021-04-21 DIAGNOSIS — M5416 Radiculopathy, lumbar region: Secondary | ICD-10-CM | POA: Diagnosis not present

## 2021-04-21 DIAGNOSIS — H0015 Chalazion left lower eyelid: Secondary | ICD-10-CM | POA: Diagnosis not present

## 2021-04-27 ENCOUNTER — Telehealth: Payer: Self-pay | Admitting: Pharmacist

## 2021-04-27 DIAGNOSIS — Z79899 Other long term (current) drug therapy: Secondary | ICD-10-CM | POA: Diagnosis not present

## 2021-04-27 DIAGNOSIS — M48061 Spinal stenosis, lumbar region without neurogenic claudication: Secondary | ICD-10-CM | POA: Diagnosis not present

## 2021-04-27 DIAGNOSIS — M5136 Other intervertebral disc degeneration, lumbar region: Secondary | ICD-10-CM | POA: Diagnosis not present

## 2021-04-27 DIAGNOSIS — M159 Polyosteoarthritis, unspecified: Secondary | ICD-10-CM | POA: Diagnosis not present

## 2021-04-27 DIAGNOSIS — M5416 Radiculopathy, lumbar region: Secondary | ICD-10-CM | POA: Diagnosis not present

## 2021-04-27 DIAGNOSIS — M0609 Rheumatoid arthritis without rheumatoid factor, multiple sites: Secondary | ICD-10-CM | POA: Diagnosis not present

## 2021-04-27 DIAGNOSIS — M4807 Spinal stenosis, lumbosacral region: Secondary | ICD-10-CM | POA: Diagnosis not present

## 2021-04-27 DIAGNOSIS — M7138 Other bursal cyst, other site: Secondary | ICD-10-CM | POA: Diagnosis not present

## 2021-04-27 DIAGNOSIS — M47816 Spondylosis without myelopathy or radiculopathy, lumbar region: Secondary | ICD-10-CM | POA: Diagnosis not present

## 2021-04-27 NOTE — Chronic Care Management (AMB) (Signed)
Chronic Care Management Pharmacy Assistant   Name: Laura Rojas  MRN: [id number] DOB: 1946/06/17   Reason for Encounter: Disease State Diabetes Mellitus    Recent office visits:  03/09/2021 [name], NP ([place] Visit) Seen for Hip pain and cough. Start on Augmentin x 7 days, Prednisone 40 MG x 5 days, and Tessalon for PRN use. Recommend Increased rest, Increasing Fluids, Acetaminophen as needed for fever/pain, Salt water gargling, chloraseptic spray and throat lozenges and Diabetic Tussin as needed. Referral to Orthopedics. 01/19/2021 [name], NP (PCP) Follow up rectal bleeding and back pain. Lumbar spine xray completed. Follow up in 3 months. 01/11/2021 [name], NP (PCP) Seen for rectal bleeding and General follow up. Recommend she monitor BP at least three days a week at home. Start on Anusol. Recommend she hold her ASA and Plavix for a few days, then restart.  Recommend [place] of Miralax daily and Colace as needed. Labs ordered. Follow up in 1 week. Recent consult visits:  01/27/2021 Malissa Hippo, NP (Endocrinology) Diabetic follow up. Increase Basaglar from 16 to 18 units each evening. Follow up in 3 months.  12/22/2020 [name] (Rheumatology) Notes not provided.  Hospital visits:  None in previous 6 months  Medications: Outpatient Encounter Medications as of 04/27/2021  Medication Sig Note   LORazepam (ATIVAN) 0.5 MG tablet TAKE 1 TAB BY MOUTH TWICE A DAY (USE THE ADDITIONAL 30 TABS ONLY SPARINGLY & ONLY FOR SEVERE ANXIETY)    amLODipine (NORVASC) 2.5 MG tablet TAKE 1 TABLET BY MOUTH EVERY DAY (Patient not taking: Reported on 03/09/2021)    Apoaequorin (PREVAGEN) 10 MG CAPS Take by mouth.    aspirin 81 MG tablet Take 81 mg by mouth daily.    atorvastatin (LIPITOR) 40 MG tablet Take 1 tablet (40 mg total) by mouth daily.    BD PEN NEEDLE NANO U/F 32G X 4 MM MISC SMARTSIG:1 Each SUB-Q 4 Times Daily    benzonatate (TESSALON) 200 MG capsule Take 1 capsule  (200 mg total) by mouth 2 (two) times daily as needed for cough.    chlorpheniramine-HYDROcodone (TUSSIONEX PENNKINETIC ER) 10-8 MG/5ML SUER Take 5 mLs by mouth every 12 (twelve) hours as needed for cough.    cholecalciferol (VITAMIN D3) 25 MCG (1000 UT) tablet Take 1,000 Units by mouth daily.    clopidogrel (PLAVIX) 75 MG tablet Take 1 tablet (75 mg total) by mouth daily.    Continuous Blood Gluc Sensor (FREESTYLE LIBRE 14 DAY SENSOR) MISC Use 1 kit every 14 (fourteen) days E11.65    Gabapentin, Once-Daily, 300 MG TABS Take 300 mg by mouth at bedtime.    hydrochlorothiazide (MICROZIDE) 12.5 MG capsule TAKE 1 CAPSULE BY MOUTH EVERY DAY    Insulin Glargine (BASAGLAR KWIKPEN) 100 UNIT/ML SOPN Inject 18 Units into the skin at bedtime.    Insulin Pen Needle (BD PEN NEEDLE NANO U/F) 32G X 4 MM MISC Use 1 each 4 (four) times daily    losartan (COZAAR) 100 MG tablet Take 1 tablet (100 mg total) by mouth daily.    Magnesium 400 MG TABS Take 1 tablet by mouth daily.    metFORMIN (GLUCOPHAGE) 500 MG tablet Take 500 mg by mouth 2 (two) times daily.     Multiple Vitamin (MULTIVITAMIN) tablet Take 1 tablet by mouth daily.    NOVOLOG 100 UNIT/ML injection Inject 10 Units into the skin 3 (three) times daily. 03/03/2021: 20 units + sliding scale Take 2 units of NovoLog if your sugar is 150-200 Take  4 units of NovoLog if your sugar is 201-250 Take 6 units of NovoLog if your sugar is 251-300 Take 8 units of NovoLog if your sugar is 301-350 Take 10 units of NovoLog if your sugar is 351-400 Take 12 units of NovoLog if your sugar is over 401      Omega-3 Fatty Acids (FISH OIL) 1200 MG CAPS Take 1,200 mg by mouth daily.    sertraline (ZOLOFT) 100 MG tablet Take 1.5 tablets (150 mg total) by mouth daily.    vitamin B-12 (CYANOCOBALAMIN) 1000 MCG tablet Take 1,000 mcg by mouth daily.    No facility-administered encounter medications on file as of 04/27/2021.   Recent Relevant Labs: Lab Results  Component Value  Date/Time   HGBA1C 8.2 (H) 01/11/2021 09:18 AM   HGBA1C 8.0 (H) 10/13/2020 09:32 AM   HGBA1C 8.2 12/31/2019 12:00 AM   HGBA1C 8.8% 09/24/2019 12:00 AM   MICROALBUR 30 (H) 01/11/2021 09:18 AM   MICROALBUR 30 (H) 03/17/2020 10:39 AM    Kidney Function Lab Results  Component Value Date/Time   CREATININE 0.99 01/11/2021 09:20 AM   CREATININE 1.06 (H) 10/13/2020 09:34 AM   GFRNONAA 56 (L) 01/11/2021 09:20 AM   GFRAA 64 01/11/2021 09:20 AM    Current antihyperglycemic regimen:  Metformin 500 mg bid           Basaglar 20 unit qpm Novolog 18 units with meals + SS  What recent interventions/DTPs have been made to improve glycemic control:  None noted  Have there been any recent hospitalizations or ED visits since last visit with CPP? No   Patient reports hypoglycemic symptoms, including Shaky   Patient reports hyperglycemic symptoms, including fatigue and weakness   How often are you checking your blood sugar? 3-4 times daily   What are your blood sugars ranging?  Fasting: 05/05/21-137 Before meals:  After meals: 05/05/21-354 Bedtime:  During the week, how often does your blood glucose drop below 70? Once a week  Are you checking your feet daily/regularly?          Patient states she does check her feet daily.  Adherence Review: Is the patient currently on a STATIN medication? Yes Is the patient currently on ACE/ARB medication? Yes Does the patient have >5 day gap between last estimated fill dates? No   Star Rating Drugs: Atorvastatin 40 mg Last filled:03/19/2021 90 DS Losartan 100 mg Last filled:03/24/2021 90 DS Metformin 500 mg Last filled:03/07/2021 90 DS  Myriam Elta [place], [place] [phone]

## 2021-04-28 ENCOUNTER — Ambulatory Visit: Payer: Medicare Other | Admitting: Nurse Practitioner

## 2021-04-28 ENCOUNTER — Other Ambulatory Visit: Payer: Self-pay

## 2021-04-28 DIAGNOSIS — M5459 Other low back pain: Secondary | ICD-10-CM | POA: Diagnosis not present

## 2021-04-28 DIAGNOSIS — M5416 Radiculopathy, lumbar region: Secondary | ICD-10-CM | POA: Diagnosis not present

## 2021-04-28 DIAGNOSIS — M25551 Pain in right hip: Secondary | ICD-10-CM | POA: Diagnosis not present

## 2021-04-29 ENCOUNTER — Encounter: Payer: Self-pay | Admitting: Nurse Practitioner

## 2021-04-29 ENCOUNTER — Ambulatory Visit (INDEPENDENT_AMBULATORY_CARE_PROVIDER_SITE_OTHER): Payer: Medicare Other | Admitting: Nurse Practitioner

## 2021-04-29 VITALS — BP 126/77 | HR 59 | Temp 98.1°F | Wt 163.6 lb

## 2021-04-29 DIAGNOSIS — I152 Hypertension secondary to endocrine disorders: Secondary | ICD-10-CM | POA: Diagnosis not present

## 2021-04-29 DIAGNOSIS — E1122 Type 2 diabetes mellitus with diabetic chronic kidney disease: Secondary | ICD-10-CM

## 2021-04-29 DIAGNOSIS — E785 Hyperlipidemia, unspecified: Secondary | ICD-10-CM | POA: Diagnosis not present

## 2021-04-29 DIAGNOSIS — E1169 Type 2 diabetes mellitus with other specified complication: Secondary | ICD-10-CM

## 2021-04-29 DIAGNOSIS — I771 Stricture of artery: Secondary | ICD-10-CM | POA: Diagnosis not present

## 2021-04-29 DIAGNOSIS — M353 Polymyalgia rheumatica: Secondary | ICD-10-CM

## 2021-04-29 DIAGNOSIS — F5101 Primary insomnia: Secondary | ICD-10-CM

## 2021-04-29 DIAGNOSIS — M25551 Pain in right hip: Secondary | ICD-10-CM | POA: Insufficient documentation

## 2021-04-29 DIAGNOSIS — F339 Major depressive disorder, recurrent, unspecified: Secondary | ICD-10-CM

## 2021-04-29 DIAGNOSIS — K625 Hemorrhage of anus and rectum: Secondary | ICD-10-CM

## 2021-04-29 DIAGNOSIS — F413 Other mixed anxiety disorders: Secondary | ICD-10-CM | POA: Diagnosis not present

## 2021-04-29 DIAGNOSIS — R195 Other fecal abnormalities: Secondary | ICD-10-CM

## 2021-04-29 DIAGNOSIS — I25708 Atherosclerosis of coronary artery bypass graft(s), unspecified, with other forms of angina pectoris: Secondary | ICD-10-CM | POA: Diagnosis not present

## 2021-04-29 DIAGNOSIS — E1165 Type 2 diabetes mellitus with hyperglycemia: Secondary | ICD-10-CM | POA: Diagnosis not present

## 2021-04-29 DIAGNOSIS — E1159 Type 2 diabetes mellitus with other circulatory complications: Secondary | ICD-10-CM | POA: Diagnosis not present

## 2021-04-29 DIAGNOSIS — Z79899 Other long term (current) drug therapy: Secondary | ICD-10-CM

## 2021-04-29 DIAGNOSIS — N183 Chronic kidney disease, stage 3 unspecified: Secondary | ICD-10-CM

## 2021-04-29 DIAGNOSIS — Z794 Long term (current) use of insulin: Secondary | ICD-10-CM | POA: Diagnosis not present

## 2021-04-29 LAB — BAYER DCA HB A1C WAIVED: HB A1C (BAYER DCA - WAIVED): 8.2 % — ABNORMAL HIGH (ref <7.0)

## 2021-04-29 NOTE — Patient Instructions (Signed)
Hip Pain The hip is the joint between the upper legs and the lower pelvis. The bones, cartilage, tendons, and muscles of your hip joint support your body and allow you to move around. Hip pain can range from a minor ache to severe pain in one or both of your hips. The pain may be felt on the inside of the hip joint near the groin, or on the outside near the buttocks and upper thigh. You may also have swelling or stiffness in your hip area. Follow these instructions at home: Managing pain, stiffness, and swelling  If directed, put ice on the painful area. To do this: ? Put ice in a plastic bag. ? Place a towel between your skin and the bag. ? Leave the ice on for 20 minutes, 2-3 times a day.  If directed, apply heat to the affected area as often as told by your health care provider. Use the heat source that your health care provider recommends, such as a moist heat pack or a heating pad. ? Place a towel between your skin and the heat source. ? Leave the heat on for 20-30 minutes. ? Remove the heat if your skin turns bright red. This is especially important if you are unable to feel pain, heat, or cold. You may have a greater risk of getting burned.      Activity  Do exercises as told by your health care provider.  Avoid activities that cause pain. General instructions  Take over-the-counter and prescription medicines only as told by your health care provider.  Keep a journal of your symptoms. Write down: ? How often you have hip pain. ? The location of your pain. ? What the pain feels like. ? What makes the pain worse.  Sleep with a pillow between your legs on your most comfortable side.  Keep all follow-up visits as told by your health care provider. This is important.   Contact a health care provider if:  You cannot put weight on your leg.  Your pain or swelling continues or gets worse after one week.  It gets harder to walk.  You have a fever. Get help right away  if:  You fall.  You have a sudden increase in pain and swelling in your hip.  Your hip is red or swollen or very tender to touch. Summary  Hip pain can range from a minor ache to severe pain in one or both of your hips.  The pain may be felt on the inside of the hip joint near the groin, or on the outside near the buttocks and upper thigh.  Avoid activities that cause pain.  Write down how often you have hip pain, the location of the pain, what makes it worse, and what it feels like. This information is not intended to replace advice given to you by your health care provider. Make sure you discuss any questions you have with your health care provider. Document Revised: 03/31/2019 Document Reviewed: 03/31/2019 Elsevier Patient Education  2021 Elsevier Inc.  

## 2021-04-29 NOTE — Assessment & Plan Note (Signed)
Chronic, ongoing, followed by endocrinology with A1C today 8.2%.  Continue this collaboration and current medication regimen, defer changes to endo at this time (she sees them this month) + collaboration with CCM team in office.  Would benefit from trial of SGLT in future (especially with cardiac health), will discuss with endocrinology and further discuss next visit with patient.  Bring Libre to next visit.  Return to office in 3 months.

## 2021-04-29 NOTE — Assessment & Plan Note (Signed)
Continue collaboration with cardiology and preventative treatment.

## 2021-04-29 NOTE — Assessment & Plan Note (Signed)
Followed by ortho, continue this collaboration, had recent imaging and awaiting results.  Recommend she take Gabapentin as ordered for pain vs Tramadol.  Do not take either at same time as her benzo, discussed risks with her.

## 2021-04-29 NOTE — Assessment & Plan Note (Signed)
Chronic, stable.  Continue Losartan for kidney protection.  Urine micro in office February 2022 =  ALB 30 and A:C <30. Continue collaboration with cardiology and endocrinology.  Consider nephrology referral if worsening function.  CMP next visit.

## 2021-04-29 NOTE — Assessment & Plan Note (Signed)
Chronic, ongoing with use of Lorazepam nightly.  Has had trial reductions in past and other medications without success.  Is fully aware of risks of chronic benzo use and aware this will be discussed each visit.  Return in 3 months.  UDS today, contract up to date.

## 2021-04-29 NOTE — Assessment & Plan Note (Signed)
No further since February -- will recheck FOBT.

## 2021-04-29 NOTE — Assessment & Plan Note (Signed)
Chronic, ongoing for insomnia.  Has had trials of other medications and reductions with poor outcomes.  Fully aware of risks of chronic benzo use and aware this will be discussed each visit.  UDS today for update.

## 2021-04-29 NOTE — Assessment & Plan Note (Signed)
Chronic, ongoing.  Continue current medication regimen and adjust as needed.  Lipid panel next visit.  Discussed with patient, if myalgias with Atorvastatin, + her history of myalgia with statins, could consider Repatha initiation if poor tolerance to new regimen.

## 2021-04-29 NOTE — Assessment & Plan Note (Addendum)
Chronic, ongoing.  Denies SI/HI.  Continue Sertraline and Ativan.  Has had multiple attempts at reduction of benzo with no success.  Discussed at length risks with her and she is aware.  Continue Ativan and fill upon request.  PDMP reviewed.  Return in 3 months, has up to date UDS (is due today and obtained) and contract.

## 2021-04-29 NOTE — Progress Notes (Signed)
BP 126/77   Pulse (!) 59   Temp 98.1 F (36.7 C) (Oral)   Wt 163 lb 9.6 oz (74.2 kg)   SpO2 96%   BMI 27.22 kg/m    Subjective:    Patient ID: Laura Rojas, female    DOB: 1946-02-13, 75 y.o.   MRN: [id number]  HPI: Laura Rojas is a 75 y.o. female  Chief Complaint  Patient presents with  . Diabetes    Patient recent eye exam was requested at today's visit.   Marland Kitchen Hyperlipidemia  . Hypertension  . Arthritis  . Hip Pain    Patient states she is having issues with her right hip pain. Patient states she had an MRI and X-rays on Wednesday with [facility]. Patient states she has to sit up to rest because she hasn't been able to lay down. Pain get worse in the evenings and at night and painfully with movement.     Has history of positive FOBT with hemorrhoids recently, no further bleeding.  DIABETES Followed by endocrinology and last saw NP [facility] 01/27/21 with Basaglar increased to 18 units.  Her last A1c was 8.2% February.  She does endorse good/bad diet. She continues on [employer], and Metformin.  Did have recent steroid injection in hip and sugars ran high. Hypoglycemic episodes: a few  Polydipsia/polyuria: no Visual disturbance: no Chest pain: no Paresthesias: no Glucose Monitoring: yes             Accucheck frequency: Daily             Fasting glucose: 200 range             Post prandial:             Evening:              Before meals: Taking Insulin?: yes             Long acting insulin: 18 units             Short acting insulin: 20 - 24 units with meals and sliding scale Blood Pressure Monitoring: rarely Retinal Examination: Not Up to Date Foot Exam: Up to Date Pneumovax: Up to Date Influenza: Up to Date Aspirin: yes   HYPERTENSION / HYPERLIPIDEMIA/CAD She is followed by cardiology, Dr. Sallyanne Kuster. Last saw 10/07/20 with changes to statin -- changed to Atorvastatin due to concerns for interaction with Simvastatin and Amlodipine. Cardiology follows due  to subclavian artery stenosis and angina. Continues on Amlodipine, Plavix, HCTZ, Fish oil, Losartan.  Satisfied with current treatment? yes Duration of hypertension: chronic BP monitoring frequency: not checking BP range:not checking BP medication side effects: no Duration of hyperlipidemia: chronic Cholesterol medication side effects: no Cholesterol supplements: fish oil Medication compliance: good compliance Aspirin: yes Recent stressors: no Recurrent headaches: no Visual changes: no Palpitations: no Dyspnea: no Chest pain: no Lower extremity edema: no Dizzy/lightheaded: no   CHRONIC KIDNEY DISEASE Last labs GFR 56 and CRT 0.99. CKD status: stable Medications renally dose: yes Previous renal evaluation: no Pneumovax:  Up to Date Influenza Vaccine:  Up to Date   CHRONIC JOINT PAIN On review is followed by rheumatology with diagnosis of polymyalgia rheumatica syndrome with a positive anti-CCP test. Last seen 04/27/21, having infusions.  She recently saw [facility] for right hip pain with MRI and X-ray on Wednesday -- has been unable to rest and pain is worse at night. Currently doing physical therapy on review.  She returns to ortho the  15th.  She was given Tramadol for pain + Gabapentin -- has not tried this yet.  Was also given steroid shot. Duration: chronic Involved joints: right hand, both feet, and left knee Mechanism of injury: arthritis Severity: 10/10 at worst Quality:  dull, aching and burning Frequency: intermittent -- better during day and worse at night Radiation: no Aggravating factors: walking and movement  Alleviating factors: Aspercreme and APAP  Status: fluctuating Treatments attempted: Tylenol and Aspercreme Relief with NSAIDs?:  No NSAIDs Taken Weakness with weight bearing or walking: no Sensation of giving way: no Locking: no Popping: no Bruising: no Swelling: no Redness: no Paresthesias/decreased sensation: no Fevers:  no  ANXIETY/STRESS Taking Zoloft 150 MG daily.  Is taking Ativan 0.5 MG, takes this at night only, although ordered BID -- currently reports she takes 1 MG at night. Occasionally will take extra dose at day time, but not often, reports since last visit has had to not take extra as often after out discussion.  Pt aware of risks of benzo medication use to include increased sedation, respiratory suppression, falls, dependence and cardiovascular events.  Pt would like to continue treatment as benefit determined to outweigh risk.  On review PDMP last Ativan fill 01/22/21 for 3 months supply, previous PCP ordered this way. Duration:stable Anxious mood: yes  Excessive worrying: no Irritability: no  Sweating: no Nausea: no Palpitations:no Hyperventilation: no Panic attacks: no Agoraphobia: no  Obscessions/compulsions: no Depressed mood: no Depression screen [facility] 2/9 04/29/2021 04/29/2021 01/19/2021 10/13/2020 08/30/2020  Decreased Interest 0 0 0 0 0  Down, Depressed, Hopeless 0 0 0 1 0  PHQ - 2 Score 0 0 0 1 0  Altered sleeping 3 3 3 1  -  Tired, decreased energy 3 3 2  0 -  Change in appetite 1 1 0 1 -  Feeling bad or failure about yourself  0 0 0 0 -  Trouble concentrating 0 0 0 0 -  Moving slowly or fidgety/restless 0 0 0 0 -  Suicidal thoughts 0 0 0 0 -  PHQ-9 Score 7 7 5 3  -  Difficult doing work/chores - Not difficult at all - Not difficult at all -  Some recent data might be hidden  Anhedonia: no Weight changes: no Insomnia: yes hard to fall asleep  Hypersomnia: no Fatigue/loss of energy: no Feelings of worthlessness: no Feelings of guilt: no Impaired concentration/indecisiveness: no Suicidal ideations: no  Crying spells: no Recent Stressors/Life Changes: no   Relationship problems: no   Family stress: no     Financial stress: no    Job stress: no    Recent death/loss: no GAD 7 : Generalized Anxiety Score 04/29/2021 10/13/2020 03/17/2020 10/14/2019  Nervous, Anxious, on Edge 1 0 0 3   Control/stop worrying 1 0 0 3  Worry too much - different things 1 0 0 3  Trouble relaxing 1 0 0 3  Restless 0 0 0 0  Easily annoyed or irritable 0 0 0 3  Afraid - awful might happen 0 0 0 3  Total GAD 7 Score 4 0 0 18  Anxiety Difficulty Not difficult at all - Not difficult at all Somewhat difficult   Relevant past medical, surgical, family and social history reviewed and updated as indicated. Interim medical history since our last visit reviewed. Allergies and medications reviewed and updated.  Review of Systems  Constitutional: Negative for activity change, appetite change, diaphoresis, fatigue and fever.  Respiratory: Negative for cough, chest tightness and shortness of breath.  Cardiovascular: Negative for chest pain, palpitations and leg swelling.  Gastrointestinal: Negative.   Endocrine: Negative for polydipsia, polyphagia and polyuria.  Neurological: Negative.   Psychiatric/Behavioral: Negative.     Per HPI unless specifically indicated above     Objective:    BP 126/77   Pulse (!) 59   Temp 98.1 F (36.7 C) (Oral)   Wt 163 lb 9.6 oz (74.2 kg)   SpO2 96%   BMI 27.22 kg/m   Wt Readings from Last 3 Encounters:  04/29/21 163 lb 9.6 oz (74.2 kg)  01/19/21 164 lb (74.4 kg)  01/11/21 164 lb (74.4 kg)    Physical Exam Vitals and nursing note reviewed.  Constitutional:      General: She is awake. She is not in acute distress.    Appearance: She is well-developed, well-groomed and overweight. She is not ill-appearing.  HENT:     Head: Normocephalic.     Right Ear: Hearing normal.     Left Ear: Hearing normal.  Eyes:     General: Lids are normal.        Right eye: No discharge.        Left eye: No discharge.     Conjunctiva/sclera: Conjunctivae normal.     Pupils: Pupils are equal, round, and reactive to light.  Neck:     Thyroid: No thyromegaly.     Vascular: Carotid bruit (bilateral) present.  Cardiovascular:     Rate and Rhythm: Normal rate and regular  rhythm.     Heart sounds: Normal heart sounds. No murmur heard. No gallop.   Pulmonary:     Effort: Pulmonary effort is normal. No accessory muscle usage or respiratory distress.     Breath sounds: Normal breath sounds.  Abdominal:     General: Bowel sounds are normal.     Palpations: Abdomen is soft.  Musculoskeletal:     Cervical back: Normal range of motion and neck supple.     Right lower leg: No edema.     Left lower leg: No edema.  Skin:    General: Skin is warm and dry.  Neurological:     Mental Status: She is alert and oriented to person, place, and time.  Psychiatric:        Attention and Perception: Attention normal.        Mood and Affect: Mood normal.        Speech: Speech normal.        Behavior: Behavior normal. Behavior is cooperative.        Thought Content: Thought content normal.    Results for orders placed or performed in visit on 04/29/21  Bayer DCA Hb A1c Waived  Result Value Ref Range   HB A1C (BAYER DCA - WAIVED) 8.2 (H) <7.0 %      Assessment & Plan:   Problem List Items Addressed This Visit      Cardiovascular and Mediastinum   Hypertension associated with diabetes ([place])    Chronic, stable with BP at goal in office today.  Continue current medication regimen and collaboration with cardiology. CMP next visit.  Recommend she monitor BP at least three days a week at home and document for providers + bring to visits.  DASH diet focus.  Return in 3 months.      Relevant Medications   simvastatin (ZOCOR) 40 MG tablet   Other Relevant Orders   Bayer DCA Hb A1c Waived (Completed)   Subclavian artery stenosis, left (HCC)    Stable,  continue collaboration with cardiology and preventative treatment ASA, Plavix, and statin.      Relevant Medications   simvastatin (ZOCOR) 40 MG tablet   Coronary artery disease of bypass graft of native heart with stable angina pectoris ([place])    Continue collaboration with cardiology and preventative treatment.       Relevant Medications   simvastatin (ZOCOR) 40 MG tablet   tiZANidine (ZANAFLEX) 4 MG tablet     Digestive   Rectal bleeding    No further since February -- will recheck FOBT.        Endocrine   Hyperlipidemia associated with type 2 diabetes mellitus (HCC)    Chronic, ongoing.  Continue current medication regimen and adjust as needed.  Lipid panel next visit.  Discussed with patient, if myalgias with Atorvastatin, + her history of myalgia with statins, could consider Repatha initiation if poor tolerance to new regimen.      Relevant Medications   simvastatin (ZOCOR) 40 MG tablet   Other Relevant Orders   Bayer DCA Hb A1c Waived (Completed)   CKD stage 3 due to type 2 diabetes mellitus (HCC)    Chronic, stable.  Continue Losartan for kidney protection.  Urine micro in office February 2022 =  ALB 30 and A:C <30. Continue collaboration with cardiology and endocrinology.  Consider nephrology referral if worsening function.  CMP next visit.      Relevant Medications   simvastatin (ZOCOR) 40 MG tablet   Type 2 diabetes mellitus with hyperglycemia, with long-term current use of insulin (HCC) - Primary    Chronic, ongoing, followed by endocrinology with A1C today 8.2%.  Continue this collaboration and current medication regimen, defer changes to endo at this time (she sees them this month) + collaboration with CCM team in office.  Would benefit from trial of SGLT in future (especially with cardiac health), will discuss with endocrinology and further discuss next visit with patient.  Bring Libre to next visit.  Return to office in 3 months.      Relevant Medications   simvastatin (ZOCOR) 40 MG tablet   Other Relevant Orders   Bayer DCA Hb A1c Waived (Completed)     Other   Anxiety disorder    Chronic, ongoing.  Denies SI/HI.  Continue Sertraline and Ativan.  Has had multiple attempts at reduction of benzo with no success.  Discussed at length risks with her and she is aware.  Continue  Ativan and fill upon request.  PDMP reviewed.  Return in 3 months, has up to date UDS (is due today and obtained) and contract.      Insomnia    Chronic, ongoing with use of Lorazepam nightly.  Has had trial reductions in past and other medications without success.  Is fully aware of risks of chronic benzo use and aware this will be discussed each visit.  Return in 3 months.  UDS today, contract up to date.      Polymyalgia rheumatica (HCC)    Chronic, ongoing.  Continue collaboration with rheumatology and current medication regimen.        Depression, recurrent (HCC)    Chronic, ongoing.  Denies SI/HI.  Continue Sertraline 150 MG daily as is ordered, which she agrees to do. This will benefit mood overall and help reduce benzo use.  Consider increase to 200 MG in future for increased benefit.  Return in 3 months.      Long-term current use of benzodiazepine    Chronic, ongoing for insomnia.  Has  had trials of other medications and reductions with poor outcomes.  Fully aware of risks of chronic benzo use and aware this will be discussed each visit.  UDS today for update.      Relevant Orders   [id number] 11+Oxyco+Alc+Crt-Bund   Right hip pain    Followed by ortho, continue this collaboration, had recent imaging and awaiting results.  Recommend she take Gabapentin as ordered for pain vs Tramadol.  Do not take either at same time as her benzo, discussed risks with her.       Other Visit Diagnoses    Positive fecal occult blood test       Recheck FOBT now that has had no further bleeding for a couple months and hemhorroid symptoms improved.   Relevant Orders   Fecal occult blood, imunochemical       Follow up plan: Return in about 3 months (around 07/30/2021).

## 2021-04-29 NOTE — Assessment & Plan Note (Signed)
Chronic, stable with BP at goal in office today.  Continue current medication regimen and collaboration with cardiology. CMP next visit.  Recommend she monitor BP at least three days a week at home and document for providers + bring to visits.  DASH diet focus.  Return in 3 months.

## 2021-04-29 NOTE — Assessment & Plan Note (Signed)
Chronic, ongoing.  Continue collaboration with rheumatology and current medication regimen.

## 2021-04-29 NOTE — Assessment & Plan Note (Signed)
Stable, continue collaboration with cardiology and preventative treatment ASA, Plavix, and statin.

## 2021-04-29 NOTE — Assessment & Plan Note (Signed)
Chronic, ongoing.  Denies SI/HI.  Continue Sertraline 150 MG daily as is ordered, which she agrees to do. This will benefit mood overall and help reduce benzo use.  Consider increase to 200 MG in future for increased benefit.  Return in 3 months.

## 2021-05-01 LAB — DRUG SCREEN 764883 11+OXYCO+ALC+CRT-BUND
Amphetamines, Urine: NEGATIVE ng/mL
BENZODIAZ UR QL: NEGATIVE ng/mL
Barbiturate: NEGATIVE ng/mL
Cannabinoid Quant, Ur: NEGATIVE ng/mL
Cocaine (Metabolite): NEGATIVE ng/mL
Creatinine: 106.2 mg/dL (ref 20.0–300.0)
Ethanol: NEGATIVE %
Meperidine: NEGATIVE ng/mL
Methadone Screen, Urine: NEGATIVE ng/mL
OPIATE SCREEN URINE: NEGATIVE ng/mL
Oxycodone/Oxymorphone, Urine: NEGATIVE ng/mL
Phencyclidine: NEGATIVE ng/mL
Propoxyphene: NEGATIVE ng/mL
Tramadol: NEGATIVE ng/mL
pH, Urine: 5.6 (ref 4.5–8.9)

## 2021-05-03 ENCOUNTER — Other Ambulatory Visit: Payer: Medicare Other

## 2021-05-03 ENCOUNTER — Other Ambulatory Visit: Payer: Self-pay

## 2021-05-03 DIAGNOSIS — R195 Other fecal abnormalities: Secondary | ICD-10-CM | POA: Diagnosis not present

## 2021-05-04 LAB — FECAL OCCULT BLOOD, IMMUNOCHEMICAL: Fecal Occult Bld: NEGATIVE

## 2021-05-04 NOTE — Progress Notes (Signed)
Contacted via [place], your stool sample came back negative for blood this check!!

## 2021-05-11 DIAGNOSIS — M5416 Radiculopathy, lumbar region: Secondary | ICD-10-CM | POA: Diagnosis not present

## 2021-05-12 ENCOUNTER — Telehealth: Payer: Self-pay

## 2021-05-12 ENCOUNTER — Telehealth: Payer: Self-pay | Admitting: Cardiovascular Disease

## 2021-05-12 DIAGNOSIS — M0609 Rheumatoid arthritis without rheumatoid factor, multiple sites: Secondary | ICD-10-CM | POA: Diagnosis not present

## 2021-05-12 NOTE — Telephone Encounter (Signed)
   [facility] Pre-operative Risk Assessment    Patient Name: Laura Rojas  DOB: Apr 18, 1946  MRN: [id number]   Request for surgical clearance:  What type of surgery is being performed? LUMBAR STEROID INJECTION   When is this surgery scheduled? TBD   What type of clearance is required (medical clearance vs. Pharmacy clearance to hold med vs. Both)? BOTH  Are there any medications that need to be held prior to surgery and how long?PLAVIX-7 DAYS & ASA 6 DAYS PRIOR AND RESUME 24 HOURS  AFTER  Practice name and name of physician performing surgery? [facility]  ATTNJanace Hoard   What is the office phone number? [phone]   7.   What is the office fax number? [phone]  8.   Anesthesia type (None, local, MAC, general) ? MAC

## 2021-05-12 NOTE — Telephone Encounter (Signed)
   [facility] will fax clearance for pt

## 2021-05-12 NOTE — Telephone Encounter (Signed)
Dr. Sallyanne Kuster   This patient is to have a lumbar spinal injection with procedural team asking to hold Plavix 7 days and ASA 6 days prior to procedure and resume 24 hours after injection.   She has a hx od CAD with PCI to the LAD and pRCA 2007 with known untreated LAD and jailed ostial Diag at the level of the pLAD stent. Last stress test in 2013 was normal and mos recent cath 2009 with patent stents.   Please send your recs to the pre op pool  Thank you

## 2021-05-16 DIAGNOSIS — Z794 Long term (current) use of insulin: Secondary | ICD-10-CM | POA: Diagnosis not present

## 2021-05-16 DIAGNOSIS — N182 Chronic kidney disease, stage 2 (mild): Secondary | ICD-10-CM | POA: Diagnosis not present

## 2021-05-16 DIAGNOSIS — I152 Hypertension secondary to endocrine disorders: Secondary | ICD-10-CM | POA: Diagnosis not present

## 2021-05-16 DIAGNOSIS — E1159 Type 2 diabetes mellitus with other circulatory complications: Secondary | ICD-10-CM | POA: Diagnosis not present

## 2021-05-16 DIAGNOSIS — E1169 Type 2 diabetes mellitus with other specified complication: Secondary | ICD-10-CM | POA: Diagnosis not present

## 2021-05-16 DIAGNOSIS — E785 Hyperlipidemia, unspecified: Secondary | ICD-10-CM | POA: Diagnosis not present

## 2021-05-16 DIAGNOSIS — E1122 Type 2 diabetes mellitus with diabetic chronic kidney disease: Secondary | ICD-10-CM | POA: Diagnosis not present

## 2021-05-25 ENCOUNTER — Telehealth: Payer: Self-pay | Admitting: Cardiovascular Disease

## 2021-05-25 NOTE — Telephone Encounter (Signed)
Follow up:     Patient calling stating that she has not heard from anyone yet please advise.

## 2021-05-25 NOTE — Telephone Encounter (Signed)
Follow up:     Patient calling to follow.

## 2021-05-25 NOTE — Telephone Encounter (Signed)
Left VM  Per Cr. Croitoru: OK to stop clopidogrel for 5-7 days pre-injection and restart 24 hours after injection

## 2021-05-25 NOTE — Telephone Encounter (Signed)
Per chart review: patient returning call in regards to pre op clearance   Advised will route message to pre op provider to address tomorrow.  Patient aware.

## 2021-05-26 ENCOUNTER — Telehealth: Payer: Self-pay | Admitting: Cardiovascular Disease

## 2021-05-26 NOTE — Telephone Encounter (Signed)
PT is calling with additional questions

## 2021-05-26 NOTE — Telephone Encounter (Signed)
   Name: Laura Rojas  DOB: 02-22-1946  MRN: [id number]   Primary Cardiologist: Sanda Klein, MD  Chart reviewed as part of pre-operative protocol coverage. Patient was contacted 05/26/2021 in reference to pre-operative risk assessment for pending surgery as outlined below.  Laura Rojas was last seen on 10/07/20 by Dr. Sallyanne Kuster.  Since that day, Laura Rojas has done well. Prior to onset of back pain 2 months ago, she was able to complete more than 4.0 METS. She is still able to climb stairs (basement), primarily limited back pain.   Per Dr. Sallyanne Kuster,  OK to stop clopidogrel for 5-7 days pre-injection and restart 24 hours after injection.   We prefer continuation of ASA; however, may hold for spinal procedures if necessary.   Therefore, based on ACC/AHA guidelines, the patient would be at acceptable risk for the planned procedure without further cardiovascular testing.   The patient was advised that if she develops new symptoms prior to surgery to contact our office to arrange for a follow-up visit, and she verbalized understanding.  I will route this recommendation to the requesting party via Epic fax function and remove from pre-op pool. Please call with questions.  [place], [place] 05/26/2021, 8:13 AM

## 2021-05-26 NOTE — Telephone Encounter (Signed)
Spoke to patient . Patient wanted to know if she could use generic tylenol  for pain .  Patient states she understood instruction from yesterday pre- op for instructions. RN  informed patient per Pharmacist recommendation - from a cardiac standpoint it would be okay , but contact the office who is administered the injections. Patient verbalized understanding.

## 2021-05-31 DIAGNOSIS — M0609 Rheumatoid arthritis without rheumatoid factor, multiple sites: Secondary | ICD-10-CM | POA: Diagnosis not present

## 2021-06-09 DIAGNOSIS — M5416 Radiculopathy, lumbar region: Secondary | ICD-10-CM | POA: Diagnosis not present

## 2021-06-09 DIAGNOSIS — M47817 Spondylosis without myelopathy or radiculopathy, lumbosacral region: Secondary | ICD-10-CM | POA: Diagnosis not present

## 2021-06-13 ENCOUNTER — Telehealth: Payer: Self-pay

## 2021-06-22 ENCOUNTER — Other Ambulatory Visit: Payer: Self-pay | Admitting: Nurse Practitioner

## 2021-06-22 DIAGNOSIS — M7138 Other bursal cyst, other site: Secondary | ICD-10-CM | POA: Diagnosis not present

## 2021-06-22 DIAGNOSIS — I10 Essential (primary) hypertension: Secondary | ICD-10-CM

## 2021-06-22 NOTE — Telephone Encounter (Signed)
Pt is scheduled 9/6

## 2021-06-22 NOTE — Telephone Encounter (Signed)
Requested medications are due for refill today.  yes  Requested medications are on the active medications list.  yes  Last refill. 04/07/2020  Future visit scheduled.   yes  Notes to clinic.  Prescription is expired.

## 2021-06-28 DIAGNOSIS — M0609 Rheumatoid arthritis without rheumatoid factor, multiple sites: Secondary | ICD-10-CM | POA: Diagnosis not present

## 2021-06-30 ENCOUNTER — Ambulatory Visit: Payer: Self-pay | Admitting: *Deleted

## 2021-06-30 MED ORDER — NYSTATIN 100000 UNIT/GM EX CREA: 1.0000 "application " | TOPICAL_CREAM | Freq: Two times a day (BID) | CUTANEOUS | 2 refills | Status: DC

## 2021-06-30 NOTE — Addendum Note (Signed)
Addended by: Valerie Roys on: 06/30/2021 04:52 PM   Modules accepted: Orders

## 2021-06-30 NOTE — Telephone Encounter (Signed)
Routing to provider to advise. Looks like the patient had Nystatin in the past.

## 2021-06-30 NOTE — Telephone Encounter (Signed)
Per agent : "Patient called in about itching under her breast. She is asking Dr Ned Card could she please send her in some cream like the last time. Please call back"  Able to reach pt. Reports red raised rash under both breasts, right> left. States pimple like, dry, itchy. Denies pain, no fever, no rash elsewhere. States "I get this in the heat". States she was called in a cream last year, "This looks exactly the same." Requesting same cream be called in to [facility] if appropriate.  Assured pt NT would route to practice for PCPs review and final disposition.   Pt verbalizes understanding.

## 2021-06-30 NOTE — Telephone Encounter (Signed)
Reason for Disposition  Red, moist, irritated area between skin folds (or under larger breasts)  Answer Assessment - Initial Assessment Questions 1. APPEARANCE of RASH: "Describe the rash."      Red raised 2. LOCATION: "Where is the rash located?"      Under breasts underneath 3. NUMBER: "How many spots are there?"       4. SIZE: "How big are the spots?" (Inches, centimeters or compare to size of a coin)       5. ONSET: "When did the rash start?"      1 week ago 6. ITCHING: "Does the rash itch?" If Yes, ask: "How bad is the itch?"  (Scale 0-10; or none, mild, moderate, severe)     yes 7. PAIN: "Does the rash hurt?" If Yes, ask: "How bad is the pain?"  (Scale 0-10; or none, mild, moderate, severe)    - NONE (0): no pain    - MILD (1-3): doesn't interfere with normal activities     - MODERATE (4-7): interferes with normal activities or awakens from sleep     - SEVERE (8-10): excruciating pain, unable to do any normal activities     no 8. OTHER SYMPTOMS: "Do you have any other symptoms?" (e.g., fever)     none  Protocols used: Rash or Redness - Localized-A-AH

## 2021-07-14 DIAGNOSIS — M47817 Spondylosis without myelopathy or radiculopathy, lumbosacral region: Secondary | ICD-10-CM | POA: Diagnosis not present

## 2021-07-15 DIAGNOSIS — H0015 Chalazion left lower eyelid: Secondary | ICD-10-CM | POA: Diagnosis not present

## 2021-07-19 ENCOUNTER — Ambulatory Visit: Payer: Self-pay | Admitting: *Deleted

## 2021-07-19 ENCOUNTER — Ambulatory Visit: Payer: Medicare Other

## 2021-07-19 DIAGNOSIS — R059 Cough, unspecified: Secondary | ICD-10-CM

## 2021-07-19 NOTE — Telephone Encounter (Signed)
Routing to provider as an FYI

## 2021-07-19 NOTE — Telephone Encounter (Signed)
Patient is calling to report she has appointment today for COVID testing- patient did home test- with positive result- 2 lines. Patient has appointment at office today for testing. Patient advised per COVID protocol treatment and isolation. Message sent to office for review of results and possible start of treatment. Patient states she has cough and sinus drainage, diarrhea, headache. Patient states her symptoms started yesterday afternoon Reason for Disposition  [1] HIGH RISK for severe COVID complications (e.g., weak immune system, age > 19 years, obesity with BMI > 25, pregnant, chronic lung disease or other chronic medical condition) AND [2] COVID symptoms (e.g., cough, fever)  (Exceptions: Already seen by PCP and no new or worsening symptoms.)  Answer Assessment - Initial Assessment Questions 1. COVID-19 DIAGNOSIS: "Who made your COVID-19 diagnosis?" "Was it confirmed by a positive lab test or self-test?" If not diagnosed by a doctor (or NP/PA), ask "Are there lots of cases (community spread) where you live?" Note: See public health department website, if unsure.     + COVID home test- patient has appointment today at 4pm for office test 2. COVID-19 EXPOSURE: "Was there any known exposure to COVID before the symptoms began?" CDC Definition of close contact: within 6 feet (2 meters) for a total of 15 minutes or more over a 24-hour period.      Husband is + COVID 3. ONSET: "When did the COVID-19 symptoms start?"      yesterday 4. WORST SYMPTOM: "What is your worst symptom?" (e.g., cough, fever, shortness of breath, muscle aches)     cough 5. COUGH: "Do you have a cough?" If Yes, ask: "How bad is the cough?"       Yes- croupy cough 6. FEVER: "Do you have a fever?" If Yes, ask: "What is your temperature, how was it measured, and when did it start?"     no 7. RESPIRATORY STATUS: "Describe your breathing?" (e.g., shortness of breath, wheezing, unable to speak)      No problem with breathing 8.  BETTER-SAME-WORSE: "Are you getting better, staying the same or getting worse compared to yesterday?"  If getting worse, ask, "In what way?"     Worse- increased symptoms 9. HIGH RISK DISEASE: "Do you have any chronic medical problems?" (e.g., asthma, heart or lung disease, weak immune system, obesity, etc.)     Age, risk-6- diabetes, high BP/lipid 10. VACCINE: "Have you had the COVID-19 vaccine?" If Yes, ask: "Which one, how many shots, when did you get it?"       yes 11. BOOSTER: "Have you received your COVID-19 booster?" If Yes, ask: "Which one and when did you get it?"       yes 12. PREGNANCY: "Is there any chance you are pregnant?" "When was your last menstrual period?"       N/a 13. OTHER SYMPTOMS: "Do you have any other symptoms?"  (e.g., chills, fatigue, headache, loss of smell or taste, muscle pain, sore throat)       Sinus drainage, diarrhea  14. O2 SATURATION MONITOR:  "Do you use an oxygen saturation monitor (pulse oximeter) at home?" If Yes, ask "What is your reading (oxygen level) today?" "What is your usual oxygen saturation reading?" (e.g., 95%)       no  Protocols used: Coronavirus (COVID-19) Diagnosed or Suspected-A-AH

## 2021-07-20 ENCOUNTER — Encounter: Payer: Self-pay | Admitting: Nurse Practitioner

## 2021-07-20 ENCOUNTER — Telehealth (INDEPENDENT_AMBULATORY_CARE_PROVIDER_SITE_OTHER): Payer: Medicare Other | Admitting: Nurse Practitioner

## 2021-07-20 DIAGNOSIS — Z20822 Contact with and (suspected) exposure to covid-19: Secondary | ICD-10-CM

## 2021-07-20 LAB — SARS-COV-2, NAA 2 DAY TAT

## 2021-07-20 LAB — NOVEL CORONAVIRUS, NAA: SARS-CoV-2, NAA: DETECTED — AB

## 2021-07-20 MED ORDER — MOLNUPIRAVIR EUA 200MG CAPSULE: 4.0000 | ORAL_CAPSULE | Freq: Two times a day (BID) | ORAL | 0 refills | Status: AC

## 2021-07-20 NOTE — Assessment & Plan Note (Signed)
Husband with current Covid, patient's symptoms started 2 days ago.  She is high risk.  Very high suspicion for Covid -- her test is pending.  At this time will treat as positive.  Have recommended quarantine for 5 days and then masking for 5 days.  Will send in [place], discussed at length the current emergency FDA Covid treatments with patient -- this has least interactions with medications patient currently on and she is in timeline for treatment.  Recommend: - Increased rest - Increasing Fluids - Acetaminophen for fever/pain.  - Mucinex.  Return in 10 days for Covid follow-up.

## 2021-07-20 NOTE — Progress Notes (Signed)
Good afternoon, please let Malaisha know the Covid testing returned positive and to take medication I ordered:)  Rest and get lots of fluid!!

## 2021-07-20 NOTE — Patient Instructions (Signed)
COVID-19: Quarantine and Isolation Quarantine If you were exposed Quarantine and stay away from others when you have been in close contact with someone who has COVID-19. Isolate If you are sick or test positive Isolate when you are sick or when you have COVID-19, even if you don't have symptoms. When to stay home Calculating quarantine The date of your exposure is considered day 0. Day 1 is the first full day after your last contact with a person who has had COVID-19. Stay home and away from other people for at least 5 days. Learn why CDC updated guidance for the general public. IF YOU were exposed to COVID-19 and are NOT  up to dateIF YOU were exposed to COVID-19 and are NOT on COVID-19 vaccinations Quarantine for at least 5 days Stay home Stay home and quarantine for at least 5 full days. Wear a well-fitting mask if you must be around others in your home. Do not travel. Get tested Even if you don't develop symptoms, get tested at least 5 days after you last had close contact with someone with COVID-19. After quarantine Watch for symptoms Watch for symptoms until 10 days after you last had close contact with someone with COVID-19. Avoid travel It is best to avoid travel until a full 10 days after you last had close contact with someone with COVID-19. If you develop symptoms Isolate immediately and get tested. Continue to stay home until you know the results. Wear a well-fitting mask around others. Take precautions until day 10 Wear a well-fitting mask Wear a well-fitting mask for 10 full days any time you are around others inside your home or in public. Do not go to places where you are unable to wear a well-fitting mask. If you must travel during days 6-10, take precautions. Avoid being around people who are more likely to get very sick from COVID-19. IF YOU were exposed to COVID-19 and are  up to dateIF YOU were exposed to COVID-19 and are on COVID-19 vaccinations No  quarantine You do not need to stay home unless you develop symptoms. Get tested Even if you don't develop symptoms, get tested at least 5 days after you last had close contact with someone with COVID-19. Watch for symptoms Watch for symptoms until 10 days after you last had close contact with someone with COVID-19. If you develop symptoms Isolate immediately and get tested. Continue to stay home until you know the results. Wear a well-fitting mask around others. Take precautions until day 10 Wear a well-fitting mask Wear a well-fitting mask for 10 full days any time you are around others inside your home or in public. Do not go to places where you are unable to wear a well-fitting mask. Take precautions if traveling Avoid being around people who are more likely to get very sick from COVID-19. IF YOU were exposed to COVID-19 and had confirmed COVID-19 within the past 90 days (you tested positive using a viral test) No quarantine You do not need to stay home unless you develop symptoms. Watch for symptoms Watch for symptoms until 10 days after you last had close contact with someone with COVID-19. If you develop symptoms Isolate immediately and get tested. Continue to stay home until you know the results. Wear a well-fitting mask around others. Take precautions until day 10 Wear a well-fitting mask Wear a well-fitting mask for 10 full days any time you are around others inside your home or in public. Do not go to places where you are  unable to wear a well-fitting mask. Take precautions if traveling Avoid being around people who are more likely to get very sick from COVID-19. Calculating isolation Day 0 is your first day of symptoms or a positive viral test. Day 1 is the first full day after your symptoms developed or your test specimen was collected. If you have COVID-19 or have symptoms, isolate for at least 5 days. IF YOU tested positive for COVID-19 or have symptoms, regardless of  vaccination status Stay home for at least 5 days Stay home for 5 days and isolate from others in your home. Wear a well-fitting mask if you must be around others in your home. Do not travel. Ending isolation if you had symptoms End isolation after 5 full days if you are fever-free for 24 hours (without the use of fever-reducing medication) and your symptoms are improving. Ending isolation if you did NOT have symptoms End isolation after at least 5 full days after your positive test. If you got very sick from COVID-19 or have a weakened immune system You should isolate for at least 10 days. Consult your doctor before ending isolation. Take precautions until day 10 Wear a well-fitting mask Wear a well-fitting mask for 10 full days any time you are around others inside your home or in public. Do not go to places where you are unable to wear a well-fitting mask. Do not travel Do not travel until a full 10 days after your symptoms started or the date your positive test was taken if you had no symptoms. Avoid being around people who are more likely to get very sick from COVID-19. Definitions Exposure Contact with someone infected with SARS-CoV-2, the virus that causes COVID-19, in a way that increases the likelihood of getting infected with the virus. Close contact A close contact is someone who was less than 6 feet away from an infected person (laboratory-confirmed or a clinical diagnosis) for a cumulative total of 15 minutes or more over a 24-hour period. For example, three individual 5-minute exposures for a total of 15 minutes. People who are exposed to someone with COVID-19 after they completed at least 5 days of isolation are not considered close contacts. Laura Rojas is a strategy used to prevent transmission of COVID-19 by keeping people who have been in close contact with someone with COVID-19 apart from others. Who does not need to quarantine? If you had close contact with  someone with COVID-19 and you are in one of the following groups, you do not need to quarantine. You are up to date with your COVID-19 vaccines. You had confirmed COVID-19 within the last 90 days (meaning you tested positive using a viral test). If you are up to date with COVID-19 vaccines, you should wear a well-fitting mask around others for 10 days from the date of your last close contact with someone with COVID-19 (the date of last close contact is considered day 0). Get tested at least 5 days after you last had close contact with someone with COVID-19. If you test positive or develop COVID-19 symptoms, isolate from other people and follow recommendations in the Isolation section below. If you tested positive for COVID-19 with a viral test within the previous 90 days and subsequently recovered and remain without COVID-19 symptoms, you do not need to quarantine or get tested after close contact. You should wear a well-fitting mask around others for 10 days from the date of your last close contact with someone with COVID-19 (the date of last  close contact is considered day 0). If you have COVID-19 symptoms, get tested and isolate from other people and follow recommendations in the Isolation section below. Who should quarantine? If you come into close contact with someone with COVID-19, you should quarantine if you are not up to date on COVID-19 vaccines. This includes people who are not vaccinated. What to do for quarantine Stay home and away from other people for at least 5 days (day 0 through day 5) after your last contact with a person who has COVID-19. The date of your exposure is considered day 0. Wear a well-fitting mask when around others at home, if possible. For 10 days after your last close contact with someone with COVID-19, watch for fever (100.38F or greater), cough, shortness of breath, or other COVID-19 symptoms. If you develop symptoms, get tested immediately and isolate until you receive  your test results. If you test positive, follow isolation recommendations. If you do not develop symptoms, get tested at least 5 days after you last had close contact with someone with COVID-19. If you test negative, you can leave your home, but continue to wear a well-fitting mask when around others at home and in public until 10 days after your last close contact with someone with COVID-19. If you test positive, you should isolate for at least 5 days from the date of your positive test (if you do not have symptoms). If you do develop COVID-19 symptoms, isolate for at least 5 days from the date your symptoms began (the date the symptoms started is day 0). Follow recommendations in the isolation section below. If you are unable to get a test 5 days after last close contact with someone with COVID-19, you can leave your home after day 5 if you have been without COVID-19 symptoms throughout the 5-day period. Wear a well-fitting mask for 10 days after your date of last close contact when around others at home and in public. Avoid people who are have weakened immune systems or are more likely to get very sick from COVID-19, and nursing homes and other high-risk settings, until after at least 10 days. If possible, stay away from people you live with, especially people who are at higher risk for getting very sick from COVID-19, as well as others outside your home throughout the full 10 days after your last close contact with someone with COVID-19. If you are unable to quarantine, you should wear a well-fitting mask for 10 days when around others at home and in public. If you are unable to wear a mask when around others, you should continue to quarantine for 10 days. Avoid people who have weakened immune systems or are more likely to get very sick from COVID-19, and nursing homes and other high-risk settings, until after at least 10 days. See additional information about travel. Do not go to places where you are  unable to wear a mask, such as restaurants and some gyms, and avoid eating around others at home and at work until after 10 days after your last close contact with someone with COVID-19. After quarantine Watch for symptoms until 10 days after your last close contact with someone with COVID-19. If you have symptoms, isolate immediately and get tested. Quarantine in high-risk congregate settings In certain congregate settings that have high risk of secondary transmission (such as Systems analyst and detention facilities, homeless shelters, or cruise ships), CDC recommends a 10-day quarantine for residents, regardless of vaccination and booster status. During periods of critical staffing  shortages, facilities may consider shortening the quarantine period for staff to ensure continuity of operations. Decisions to shorten quarantine in these settings should be made in consultation with state, local, tribal, or territorial health departments and should take into consideration the context and characteristics of the facility. CDC's setting-specific guidance provides additional recommendations for these settings. Isolation Isolation is used to separate people with confirmed or suspected COVID-19 from those without COVID-19. People who are in isolation should stay home until it's safe for them to be around others. At home, anyone sick or infected should separate from others, or wear a well-fitting mask when they need to be around others. People in isolation should stay in a specific "sick room" or area and use a separate bathroom if available. Everyone who has presumed or confirmed COVID-19 should stay home and isolate from other people for at least 5 full days (day 0 is the first day of symptoms or the date of the day of the positive viral test for asymptomatic persons). They should wear a mask when around others at home and in public for an additional 5 days. People who are confirmed to have COVID-19 or are showing  symptoms of COVID-19 need to isolate regardless of their vaccination status. This includes: People who have a positive viral test for COVID-19, regardless of whether or not they have symptoms. People with symptoms of COVID-19, including people who are awaiting test results or have not been tested. People with symptoms should isolate even if they do not know if they have been in close contact with someone with COVID-19. What to do for isolation Monitor your symptoms. If you have an emergency warning sign (including trouble breathing), seek emergency medical care immediately. Stay in a separate room from other household members, if possible. Use a separate bathroom, if possible. Take steps to improve ventilation at home, if possible. Avoid contact with other members of the household and pets. Don't share personal household items, like cups, towels, and utensils. Wear a well-fitting mask when you need to be around other people. Learn more about what to do if you are sick and how to notify your contacts. Ending isolation for people who had COVID-19 and had symptoms If you had COVID-19 and had symptoms, isolate for at least 5 days. To calculate your 5-day isolation period, day 0 is your first day of symptoms. Day 1 is the first full day after your symptoms developed. You can leave isolation after 5 full days. You can end isolation after 5 full days if you are fever-free for 24 hours without the use of fever-reducing medication and your other symptoms have improved (Loss of taste and smell may persist for weeks or months after recovery and need not delay the end of isolation). You should continue to wear a well-fitting mask around others at home and in public for 5 additional days (day 6 through day 10) after the end of your 5-day isolation period. If you are unable to wear a mask when around others, you should continue to isolate for a full 10 days. Avoid people who have weakened immune systems or are more  likely to get very sick from COVID-19, and nursing homes and other high-risk settings, until after at least 10 days. If you continue to have fever or your other symptoms have not improved after 5 days of isolation, you should wait to end your isolation until you are fever-free for 24 hours without the use of fever-reducing medication and your other symptoms have improved.  Continue to wear a well-fitting mask through day 10. Contact your healthcare provider if you have questions. See additional information about travel. Do not go to places where you are unable to wear a mask, such as restaurants and some gyms, and avoid eating around others at home and at work until a full 10 days after your first day of symptoms. If an individual has access to a test and wants to test, the best approach is to use an antigen test1 towards the end of the 5-day isolation period. Collect the test sample only if you are fever-free for 24 hours without the use of fever-reducing medication and your other symptoms have improved (loss of taste and smell may persist for weeks or months after recovery and need not delay the end of isolation). If your test result is positive, you should continue to isolate until day 10. If your test result is negative, you can end isolation, but continue to wear a well-fitting mask around others at home and in public until day 10. Follow additional recommendations for masking and avoiding travel as described above. 1As noted in the labeling for authorized over-the counter antigen tests: Negative results should be treated as presumptive. Negative results do not rule out SARS-CoV-2 infection and should not be used as the sole basis for treatment or patient management decisions, including infection control decisions. To improve results, antigen tests should be used twice over a three-day period with at least 24 hours and no more than 48 hours between tests. Note that these recommendations on ending isolation  do not apply to people who are moderately ill or very sick from COVID-19 or have weakened immune systems. See section below for recommendations for when to end isolation for these groups. Ending isolation for people who tested positive for COVID-19 but had no symptoms If you test positive for COVID-19 and never develop symptoms, isolate for at least 5 days. Day 0 is the day of your positive viral test (based on the date you were tested) and day 1 is the first full day after the specimen was collected for your positive test. You can leave isolation after 5 full days. If you continue to have no symptoms, you can end isolation after at least 5 days. You should continue to wear a well-fitting mask around others at home and in public until day 10 (day 6 through day 10). If you are unable to wear a mask when around others, you should continue to isolate for 10 days. Avoid people who have weakened immune systems or are more likely to get very sick from COVID-19, and nursing homes and other high-risk settings, until after at least 10 days. If you develop symptoms after testing positive, your 5-day isolation period should start over. Day 0 is your first day of symptoms. Follow the recommendations above for ending isolation for people who had COVID-19 and had symptoms. See additional information about travel. Do not go to places where you are unable to wear a mask, such as restaurants and some gyms, and avoid eating around others at home and at work until 10 days after the day of your positive test. If an individual has access to a test and wants to test, the best approach is to use an antigen test1 towards the end of the 5-day isolation period. If your test result is positive, you should continue to isolate until day 10. If your test result is positive, you can also choose to test daily and if your test result  is negative, you can end isolation, but continue to wear a well-fitting mask around others at home and in  public until day 10. Follow additional recommendations for masking and avoiding travel as described above. 1As noted in the labeling for authorized over-the counter antigen tests: Negative results should be treated as presumptive. Negative results do not rule out SARS-CoV-2 infection and should not be used as the sole basis for treatment or patient management decisions, including infection control decisions. To improve results, antigen tests should be used twice over a three-day period with at least 24 hours and no more than 48 hours between tests. Ending isolation for people who were moderately or very sick from COVID-19 or have a weakened immune system People who are moderately ill from COVID-19 (experiencing symptoms that affect the lungs like shortness of breath or difficulty breathing) should isolate for 10 days and follow all other isolation precautions. To calculate your 10-day isolation period, day 0 is your first day of symptoms. Day 1 is the first full day after your symptoms developed. If you are unsure if your symptoms are moderate, talk to a healthcare provider for further guidance. People who are very sick from COVID-19 (this means people who were hospitalized or required intensive care or ventilation support) and people who have weakened immune systems might need to isolate at home longer. They may also require testing with a viral test to determine when they can be around others. CDC recommends an isolation period of at least 10 and up to 20 days for people who were very sick from COVID-19 and for people with weakened immune systems. Consult with your healthcare provider about when you can resume being around other people. If you are unsure if your symptoms are severe or if you have a weakened immune system, talk to a healthcare provider for further guidance. People who have a weakened immune system should talk to their healthcare provider about the potential for reduced immune responses to  COVID-19 vaccines and the need to continue to follow current prevention measures (including wearing a well-fitting mask and avoiding crowds and poorly ventilated indoor spaces) to protect themselves against COVID-19 until advised otherwise by their healthcare provider. Close contacts of immunocompromised people--including household members--should also be encouraged to receive all recommended COVID-19 vaccine doses to help protect these people. Isolation in high-risk congregate settings In certain high-risk congregate settings that have high risk of secondary transmission and where it is not feasible to cohort people (such as Systems analyst and detention facilities, homeless shelters, and cruise ships), CDC recommends a 10-day isolation period for residents. During periods of critical staffing shortages, facilities may consider shortening the isolation period for staff to ensure continuity of operations. Decisions to shorten isolation in these settings should be made in consultation with state, local, tribal, or territorial health departments and should take into consideration the context and characteristics of the facility. CDC's setting-specific guidance provides additional recommendations for these settings. This CDC guidance is meant to supplement--not replace--any federal, state, local, territorial, or tribal health and safety laws, rules, and regulations. Recommendations for specific settings These recommendations do not apply to healthcare professionals. For guidance specific to these settings, see Healthcare professionals: Interim Guidance for Optician, dispensing with SARS-CoV-2 Infection or Exposure to SARS-CoV-2 Patients, residents, and visitors to healthcare settings: Interim Infection Prevention and Control Recommendations for Healthcare Personnel During the [place] 2019 (COVID-19) Pandemic Additional setting-specific guidance and recommendations are available. These  recommendations on quarantine and isolation do apply to [place]  settings. Additional guidance is available here: Overview of COVID-19 Quarantine for K-12 Schools Travelers: Travel information and recommendations Congregate facilities and other settings: [employer] for community, work, and school settings Ongoing COVID-19 exposure FAQs I live with someone with COVID-19, but I cannot be separated from them. How do we manage quarantine in this situation? It is very important for people with COVID-19 to remain apart from other people, if possible, even if they are living together. If separation of the person with COVID-19 from others that they live with is not possible, the other people that they live with will have ongoing exposure, meaning they will be repeatedly exposed until that person is no longer able to spread the virus to other people. In this situation, there are precautions you can take to limit the spread of COVID-19: The person with COVID-19 and everyone they live with should wear a well-fitting mask inside the home. If possible, one person should care for the person with COVID-19 to limit the number of people who are in close contact with the infected person. Take steps to protect yourself and others to reduce transmission in the home: Quarantine if you are not up to date with your COVID-19 vaccines. Isolate if you are sick or tested positive for COVID-19, even if you don't have symptoms. Learn more about the public health recommendations for testing, mask use and quarantine of close contacts, like yourself, who have ongoing exposure. These recommendations differ depending on your vaccination status. What should I do if I have ongoing exposure to COVID-19 from someone I live with? Recommendations for this situation depend on your vaccination status: If you are not up to date on COVID-19 vaccines and have ongoing exposure to COVID-19, you should: Begin quarantine immediately and  continue to quarantine throughout the isolation period of the person with COVID-19. Continue to quarantine for an additional 5 days starting the day after the end of isolation for the person with COVID-19. Get tested at least 5 days after the end of isolation of the infected person that lives with them. If you test negative, you can leave the home but should continue to wear a well-fitting mask when around others at home and in public until 10 days after the end of isolation for the person with COVID-19. Isolate immediately if you develop symptoms of COVID-19 or test positive. If you are up to date with COVID-19 vaccines and have ongoing exposure to COVID-19, you should: Get tested at least 5 days after your first exposure. A person with COVID-19 is considered infectious starting 2 days before they develop symptoms, or 2 days before the date of their positive test if they do not have symptoms. Get tested again at least 5 days after the end of isolation for the person with COVID-19. Wear a well-fitting mask when you are around the person with COVID-19, and do this throughout their isolation period. Wear a well-fitting mask around others for 10 days after the infected person's isolation period ends. Isolate immediately if you develop symptoms of COVID-19 or test positive. What should I do if multiple people I live with test positive for COVID-19 at different times? Recommendations for this situation depend on your vaccination status: If you are not up to date with your COVID-19 vaccines, you should: Quarantine throughout the isolation period of any infected person that you live with. Continue to quarantine until 5 days after the end of isolation date for the most recently infected person that lives with you. For example, if  the last day of isolation of the person most recently infected with COVID-19 was June 30, the new 5-day quarantine period starts on July 1. Get tested at least 5 days after the end  of isolation for the most recently infected person that lives with you. Wear a well-fitting mask when you are around any person with COVID-19 while that person is in isolation. Wear a well-fitting mask when you are around other people until 10 days after your last close contact. Isolate immediately if you develop symptoms of COVID-19 or test positive. If you are up to date with your COVID-19 vaccines, you should: Get tested at least 5 days after your first exposure. A person with COVID-19 is considered infectious starting 2 days before they developed symptoms, or 2 days before the date of their positive test if they do not have symptoms. Get tested again at least 5 days after the end of isolation for the most recently infected person that lives with you. Wear a well-fitting mask when you are around any person with COVID-19 while that person is in isolation. Wear a well-fitting mask around others for 10 days after the end of isolation for the most recently infected person that lives with you. For example, if the last day of isolation for the person most recently infected with COVID-19 was June 30, the new 10-day period to wear a well-fitting mask indoors in public starts on July 1. Isolate immediately if you develop symptoms of COVID-19 or test positive. I had COVID-19 and completed isolation. Do I have to quarantine or get tested if someone I live with gets COVID-19 shortly after I completed isolation? No. If you recently completed isolation and someone that lives with you tests positive for the virus that causes COVID-19 shortly after the end of your isolation period, you do not have to quarantine or get tested as long as you do not develop new symptoms. Once all of the people that live together have completed isolation or quarantine, refer to the guidance below for new exposures to COVID-19. If you had COVID-19 in the previous 90 days and then came into close contact with someone with COVID-19, you do  not have to quarantine or get tested if you do not have symptoms. But you should: Wear a well-fitting mask indoors in public for 10 days after your last close contact. Monitor for COVID-19 symptoms for 10 days from the date of your last close contact. Isolate immediately and get tested if symptoms develop. If more than 90 days have passed since your recovery from infection, follow CDC's recommendations for close contacts. These recommendations will differ depending on your vaccination status. 02/23/2021 Content source: [facility] for Immunization and Respiratory Diseases ([facility]), Division of Viral Diseases This information is not intended to replace advice given to you by your health care provider. Make sure you discuss any questions you have with your health care provider. Document Revised: 03/31/2021 Document Reviewed: 03/31/2021 Elsevier Patient Education  [place].

## 2021-07-20 NOTE — Progress Notes (Signed)
There were no vitals taken for this visit.   Subjective:    Patient ID: Laura Rojas, female    DOB: [birth year], 75 y.o.   MRN: [id number]  HPI: Laura Rojas is a 75 y.o. female  Chief Complaint  Patient presents with   Cough   Diarrhea    Patient states she became symptomatic on Monday and came by the office yesterday. Patient states her husband tested positive. Patient states she Imodium and Cough Syrup and states it helped with her symptoms.    This visit was completed via video visit through MyChart due to the restrictions of the COVID-19 pandemic. All issues as above were discussed and addressed. Physical exam was done as above through visual confirmation on video through MyChart. If it was felt that the patient should be evaluated in the office, they were directed there. The patient verbally consented to this visit. Location of the patient: home Location of the provider: work Those involved with this call:  Provider: [name], DNP CMA: Irena Reichmann, [place] Desk/Registration: Barth Kirks  Time spent on call:  21 minutes with patient face to face via video conference. More than 50% of this time was spent in counseling and coordination of care. 15 minutes total spent in review of patient's record and preparation of their chart.   COVID EXPOSURE Her symptoms started 07/18/21, husband is positive.  She is vaccinated.  Had testing done yesterday.  Reports current symptoms similar to her husband. Fever: no Cough: yes Shortness of breath: no Wheezing: no Chest pain: no Chest tightness: no Chest congestion: no Nasal congestion: yes Runny nose: yes Post nasal drip: yes Sneezing: no Sore throat: no Swollen glands: no Sinus pressure: no Headache: yes Face pain: no Toothache: no Ear pain: none Ear pressure: none Eyes red/itching:no Eye drainage/crusting: no  Vomiting:  no throwing up, but has had diarrhea Rash: no Fatigue: yes Sick contacts: yes Strep  contacts: no  Context: stable Recurrent sinusitis: no Relief with OTC cold/cough medications: yes  Treatments attempted: cold/sinus, anti-histamine, and cough syrup    Relevant past medical, surgical, family and social history reviewed and updated as indicated. Interim medical history since our last visit reviewed. Allergies and medications reviewed and updated.  Review of Systems  Constitutional:  Positive for fatigue. Negative for activity change, appetite change and fever.  HENT:  Positive for congestion, postnasal drip and rhinorrhea. Negative for ear discharge, ear pain, facial swelling, sinus pressure, sinus pain, sneezing, sore throat and voice change.   Eyes:  Negative for pain and visual disturbance.  Respiratory:  Positive for cough. Negative for chest tightness, shortness of breath and wheezing.   Cardiovascular:  Negative for chest pain, palpitations and leg swelling.  Gastrointestinal:  Positive for diarrhea. Negative for abdominal distention, abdominal pain, constipation, nausea and vomiting.  Musculoskeletal:  Negative for myalgias.  Neurological:  Positive for headaches. Negative for dizziness and numbness.  Psychiatric/Behavioral: Negative.     Per HPI unless specifically indicated above     Objective:    There were no vitals taken for this visit.  Wt Readings from Last 3 Encounters:  04/29/21 163 lb 9.6 oz (74.2 kg)  01/19/21 164 lb (74.4 kg)  01/11/21 164 lb (74.4 kg)    Physical Exam Vitals and nursing note reviewed.  Constitutional:      General: She is awake. She is not in acute distress.    Appearance: She is well-developed. She is not ill-appearing.  HENT:  Head: Normocephalic.     Right Ear: Hearing normal.     Left Ear: Hearing normal.  Eyes:     General: Lids are normal.        Right eye: No discharge.        Left eye: No discharge.     Conjunctiva/sclera: Conjunctivae normal.  Pulmonary:     Effort: Pulmonary effort is normal. No accessory  muscle usage or respiratory distress.  Musculoskeletal:     Cervical back: Normal range of motion.  Neurological:     Mental Status: She is alert and oriented to person, place, and time.  Psychiatric:        Attention and Perception: Attention normal.        Mood and Affect: Mood normal.        Behavior: Behavior normal. Behavior is cooperative.        Thought Content: Thought content normal.        Judgment: Judgment normal.    Results for orders placed or performed in visit on 05/05/21  HM DIABETES EYE EXAM  Result Value Ref Range   HM Diabetic Eye Exam No Retinopathy No Retinopathy      Assessment & Plan:   Problem List Items Addressed This Visit       Other   Close exposure to COVID-19 virus    Husband with current Covid, patient's symptoms started 2 days ago.  She is high risk.  Very high suspicion for Covid -- her test is pending.  At this time will treat as positive.  Have recommended quarantine for 5 days and then masking for 5 days.  Will send in [place], discussed at length the current emergency FDA Covid treatments with patient -- this has least interactions with medications patient currently on and she is in timeline for treatment.  Recommend: - Increased rest - Increasing Fluids - Acetaminophen for fever/pain.  - Mucinex.  Return in 10 days for Covid follow-up.       I discussed the assessment and treatment plan with the patient. The patient was provided an opportunity to ask questions and all were answered. The patient agreed with the plan and demonstrated an understanding of the instructions.   The patient was advised to call back or seek an in-person evaluation if the symptoms worsen or if the condition fails to improve as anticipated.   I provided 21+ minutes of time during this encounter.   Follow up plan: Return in about 10 days (around 07/30/2021) for Covid follow-up in office.

## 2021-07-22 NOTE — Progress Notes (Deleted)
Chronic Care Management Pharmacy Note  07/22/2021 Name:  Laura Rojas MRN:  [id number] DOB:  November 05, 1946  Summary:  Recommendations/Changes made from today's visit:  Plan:  Subjective: Laura Rojas is an 75 y.o. year old female who is a primary patient of Cannady, Barbaraann Faster, NP.  The CCM team was consulted for assistance with disease management and care coordination needs.    {CCMTELEPHONEFACETOFACE:21091510} for {CCMINITIALFOLLOWUPCHOICE:21091511} in response to provider referral for pharmacy case management and/or care coordination services.   Consent to Services:  {CCMCONSENTOPTIONS:25074}  Patient Care Team: Venita Lick, NP as PCP - General (Nurse Practitioner) Sanda Klein, MD as PCP - Cardiology (Cardiology) Marlowe Sax, MD as Referring Physician (Internal Medicine) Warnell Forester, NP as Nurse Practitioner (Surgery)  Recent office visits: ***  Recent consult visits: [facility] visits: {Hospital DC Yes/No:21091515}  Objective:  Lab Results  Component Value Date   CREATININE 0.99 01/11/2021   CREATININE 1.06 (H) 10/13/2020   CREATININE 1.05 (H) 07/07/2020    Lab Results  Component Value Date   HGBA1C 8.2 (H) 04/29/2021   Last diabetic Eye exam:  Lab Results  Component Value Date/Time   HMDIABEYEEXA No Retinopathy 12/01/2020 12:00 AM    Last diabetic Foot exam:  Lab Results  Component Value Date/Time   HMDIABFOOTEX Normal 05/07/2018 12:00 AM        Component Value Date/Time   CHOL 165 01/11/2021 0920   CHOL 170 05/08/2019 0939   TRIG 181 (H) 01/11/2021 0920   TRIG 367 (H) 05/08/2019 0939   HDL 58 01/11/2021 0920   CHOLHDL 2.9 08/28/2018 1054   VLDL 73 (H) 05/08/2019 0939   LDLCALC 77 01/11/2021 0920    Hepatic Function Latest Ref Rng & Units 01/11/2021 12/17/2019 05/08/2019  Total Protein 6.0 - 8.5 g/dL 6.6 6.4 -  Albumin 3.7 - 4.7 g/dL 4.4 4.7 -  AST 0 - 40 IU/L _0 ALT 0 - 32 IU/L _1 Alk  Phosphatase 44 - 121 IU/L 146(H) 150(H) -  Total Bilirubin 0.0 - 1.2 mg/dL 0.3 0.3 -    Lab Results  Component Value Date/Time   TSH 2.660 07/07/2020 02:02 PM   TSH 2.990 12/17/2019 01:26 PM    CBC Latest Ref Rng & Units 01/11/2021 12/17/2019 08/28/2018  WBC 3.4 - 10.8 x10E3/uL 6.0 5.1 7.4  Hemoglobin 11.1 - 15.9 g/dL 11.6 12.6 12.9  Hematocrit 34.0 - 46.6 % 35.4 39.2 38.8  Platelets 150 - 450 x10E3/uL 239 262 255    Lab Results  Component Value Date/Time   VD25OH 32.0 12/17/2019 01:26 PM    Clinical ASCVD: {YES/NO:21197} The 10-year ASCVD risk score Mikey Bussing DC Jr., et al., 2013) is: 34.1%   Values used to calculate the score:     Age: 22 years     Sex: Female     Is Non-Hispanic African American: No     Diabetic: Yes     Tobacco smoker: No     Systolic Blood Pressure: 097 mmHg     Is BP treated: Yes     HDL Cholesterol: 58 mg/dL     Total Cholesterol: 165 mg/dL    Other: (CHADS2VASc if Afib, PHQ9 if depression, MMRC or CAT for COPD, ACT, DEXA)  Social History   Tobacco Use  Smoking Status Never  Smokeless Tobacco Never   BP Readings from Last 3 Encounters:  04/29/21 126/77  03/09/21 138/63  01/19/21 120/80   Pulse Readings from Last 3 Encounters:  04/29/21 (!) 59  01/19/21 69  01/11/21 64   Wt Readings from Last 3 Encounters:  04/29/21 163 lb 9.6 oz (74.2 kg)  01/19/21 164 lb (74.4 kg)  01/11/21 164 lb (74.4 kg)    Assessment: Review of patient past medical history, allergies, medications, health status, including review of consultants reports, laboratory and other test data, was performed as part of comprehensive evaluation and provision of chronic care management services.   SDOH:  (Social Determinants of Health) assessments and interventions performed:    CCM Care Plan  Allergies  Allergen Reactions   Compazine [Prochlorperazine Edisylate] Other (See Comments)    choking   Altace [Ramipril]    Crestor [Rosuvastatin Calcium] Other (See Comments)     Myalgias   Reglan [Metoclopramide]    Sulfa Antibiotics Other (See Comments)    headache   Actos [Pioglitazone] Other (See Comments)    Hot Flashes   Amlodipine Swelling   Latex Rash    (Bandaids only )   Lipitor [Atorvastatin] Other (See Comments)    Cramps    Medications Reviewed Today     Reviewed by Venita Lick, NP (Nurse Practitioner) on 07/20/21 at 1211  Med List Status: <None>   Medication Order Taking? Sig Documenting Provider Last Dose Status Informant  amLODipine (NORVASC) 2.5 MG tablet [id number] Yes TAKE 1 TABLET BY MOUTH EVERY DAY Croitoru, Mihai, MD Taking Active   aspirin 81 MG tablet [id number] Yes Take 81 mg by mouth daily. [provider] Taking Active Self  atorvastatin (LIPITOR) 40 MG tablet [id number] Yes Take 1 tablet (40 mg total) by mouth daily. Croitoru, Mihai, MD Taking Active   BD PEN NEEDLE NANO U/F 32G X 4 MM MISC [id number] Yes SMARTSIG:1 Each SUB-Q 4 Times Daily [provider] Taking Active   cholecalciferol (VITAMIN D3) 25 MCG (1000 UT) tablet [id number] Yes Take 1,000 Units by mouth daily. [provider] Taking Active   clopidogrel (PLAVIX) 75 MG tablet [id number] Yes Take 1 tablet (75 mg total) by mouth daily. Venita Lick, NP Taking Active   Continuous Blood Gluc Sensor (FREESTYLE LIBRE 14 DAY SENSOR) [place] [id number] Yes Use 1 kit every 14 (fourteen) days E11.65 [provider] Taking Active   folic acid (FOLVITE) 1 MG tablet [id number] Yes folic acid 1 mg tablet  TAKE 1 TABLET BY MOUTH EVERY DAY [provider] Taking Active   Gabapentin, Once-Daily, 300 MG TABS [id number] Yes Take 300 mg by mouth at bedtime. [name] T, NP Taking Active   hydrochlorothiazide (MICROZIDE) 12.5 MG capsule [id number] Yes TAKE 1 CAPSULE BY MOUTH EVERY DAY Cannady, Jolene T, NP Taking Active   Insulin Glargine (BASAGLAR KWIKPEN) 100 UNIT/ML SOPN [id number] Yes Inject 18 Units into the skin at bedtime. [provider] Taking Active            Med Note Amedeo Plenty, Demetrius Charity   Tue May 04, 2020  9:36 AM)    Insulin Pen Needle (BD PEN NEEDLE NANO U/F) 32G X 4 MM MISC [id number] Yes Use 1 each 4 (four) times daily [provider] Taking Active   LORazepam (ATIVAN) 0.5 MG tablet [id number] Yes TAKE 1 TAB BY MOUTH TWICE A DAY (USE THE ADDITIONAL 30 TABS ONLY SPARINGLY & ONLY FOR SEVERE ANXIETY) [name] T, NP Taking Active   losartan (COZAAR) 100 MG tablet [id number] Yes TAKE 1 TABLET BY MOUTH EVERY DAY Cannady, Jolene T, NP Taking Active   Magnesium 400 MG TABS [id number] Yes Take 1  tablet by mouth daily. [provider] Taking Active   metFORMIN (GLUCOPHAGE) 1000 MG tablet [id number] Yes Take 500 mg by mouth 2 (two) times daily. [provider] Taking Active   molnupiravir EUA 200 mg CAPS [id number] Yes Take 4 capsules (800 mg total) by mouth 2 (two) times daily for 5 days. [name] T, NP  Active   Multiple Vitamin (MULTIVITAMIN) tablet [id number] Yes Take 1 tablet by mouth daily. [provider] Taking Active   neomycin-polymyxin b-dexamethasone (MAXITROL) 3.5-10000-0.1 SUSP [id number] No Place 1 drop into the left eye 3 (three) times daily.  Patient not taking: Reported on 07/20/2021   [provider] Not Taking Active   NOVOLOG 100 UNIT/ML injection [id number] Yes Inject 10 Units into the skin 3 (three) times daily. [provider] Taking Active            Med Note Kenton Kingfisher, JULIE S   Thu Mar 03, 2021  9:29 AM) 20 units + sliding scale Take 2 units of NovoLog if your sugar is 150-200 Take 4 units of NovoLog if your sugar is 201-250 Take 6 units of NovoLog if your sugar is 251-300 Take 8 units of NovoLog if your sugar is 301-350 Take 10 units of NovoLog if your sugar is 351-400 Take 12 units of NovoLog if your sugar is over 401     nystatin cream (MYCOSTATIN) [id number] Yes Apply 1 application topically 2 (two) times daily. Johnson, Megan P, DO  Taking Active   Omega-3 Fatty Acids (FISH OIL) 1200 MG CAPS [id number] Yes Take 1,200 mg by mouth daily. [provider] Taking Active Self  pantoprazole (PROTONIX) 40 MG tablet [id number] Yes pantoprazole 40 mg tablet,delayed release [provider] Taking Active   sertraline (ZOLOFT) 100 MG tablet [id number] Yes Take 1.5 tablets (150 mg total) by mouth daily. [name] T, NP Taking Active   tiZANidine (ZANAFLEX) 4 MG tablet [id number] No tizanidine 4 mg tablet  TAKE 1 TABLET BY MOUTH AT BED TIME AS NEEDED  Patient not taking: Reported on 07/20/2021   [provider] Not Taking Active   vitamin B-12 (CYANOCOBALAMIN) 1000 MCG tablet [id number] Yes Take 1,000 mcg by mouth daily. [provider] Taking Active             Patient Active Problem List   Diagnosis Date Noted   Right hip pain 04/29/2021   B12 deficiency 01/11/2021   Rectal bleeding 01/11/2021   Long-term current use of benzodiazepine 03/17/2020   Gastroesophageal reflux disease without esophagitis 12/17/2019   Close exposure to COVID-19 virus 11/14/2019   Type 2 diabetes mellitus with hyperglycemia, with long-term current use of insulin ([place]) 11/09/2019   Polyp of transverse colon    Coronary artery disease of bypass graft of native heart with stable angina pectoris ([place]) 07/17/2019   Depression, recurrent ([place]) 10/15/2018   Polymyalgia rheumatica ([place]) 05/15/2018   Restless leg syndrome 10/16/2017   Advanced care planning/counseling discussion 08/23/2017   Insomnia 05/24/2017   CKD stage 3 due to type 2 diabetes mellitus ([place]) 04/13/2016   Fe deficiency anemia 12/01/2015   Anxiety disorder 05/26/2015   Cough 02/17/2015   Subclavian artery stenosis, left ([place]) 01/01/2015   Hypertension associated with diabetes ([place]) 12/31/2013   Hyperlipidemia associated with type 2 diabetes mellitus ([place]) 12/31/2013    Immunization History  Administered Date(s) Administered   Influenza, High  Dose Seasonal PF 09/04/2016, 08/23/2017, 08/28/2018, 07/14/2019, 09/03/2020   Influenza,inj,Quad PF,6+ Mos 09/27/2015   Influenza-Unspecified 09/18/2014   PFIZER(Purple  Top)SARS-COV-2 Vaccination 12/04/2019, 12/25/2019, 09/01/2020   Pneumococcal Conjugate-13 04/22/2014   Pneumococcal Polysaccharide-23 11/12/2008, 07/05/2016   Td 12/21/2009, 03/17/2020   Zoster Recombinat (Shingrix) 07/14/2019, 10/15/2019    Conditions to be addressed/monitored: {CCM ASSESSMENT DISEASE OPTIONS:25047}  There are no care plans that you recently modified to display for this patient.   Medication Assistance: {MEDASSISTANCEINFO:25044}  Patient's preferred pharmacy is:  [id number] [address] [id number]: [phone]: [phone] Uses pill box? {Yes or If no, why not?:20788} Pt endorses ***% compliance  Follow Up:  {FOLLOWUP:24991}  Plan: {CM FOLLOW UP PLAN:25073}  SIG***

## 2021-07-25 ENCOUNTER — Telehealth: Payer: Self-pay

## 2021-07-25 ENCOUNTER — Telehealth: Payer: Medicare Other

## 2021-07-25 MED ORDER — BENZONATATE 200 MG PO CAPS: 200.0000 mg | ORAL_CAPSULE | Freq: Two times a day (BID) | ORAL | 0 refills | Status: DC | PRN

## 2021-07-25 NOTE — Telephone Encounter (Signed)
  Chronic Care Management  Outreach Note   Name: Laura Rojas MRN: [id number] DOB: 09-Nov-1946  Referred by: Venita Lick, NP Reason for referral: Telephone Appointment with Clinical Pharmacist, Madelin Rear.   An unsuccessful telephone outreach was attempted today. The patient was referred to the pharmacist for assistance with care management and care coordination.   Telephone appointment with clinical pharmacist today (07/25/2021) at 3pm. If patient immediately returns call, transfer to [phone]. Otherwise, please provide this number so patient can reschedule visit.   Madelin Rear, PharmD, CPP Clinical Pharmacist Practitioner  [phone]

## 2021-07-25 NOTE — Telephone Encounter (Signed)
Patient states she will feels well enough to wait until her scheduled appointment on 08/02/21, but patient wants to know if you can send her over cough syrup to help with her cough. Patient states Jolene prescribed her something before for a cough. Please advise?

## 2021-07-25 NOTE — Telephone Encounter (Signed)
Copied from [place] [id number]. Topic: General - Other >> Jul 25, 2021 11:15 AM Tessa Lerner A wrote: Reason for CRM: Patient shares that they've previously been diagnosed with COVID 19  The patient shares that they're feeling better but continue to experience discomfort related to a cough and congestion   The patient would like to be prescribed something to relieve these symptoms   The patient declined to make an appt at the time of call with agent    Routing to provider to advise on patient's request.

## 2021-07-25 NOTE — Telephone Encounter (Signed)
Spoke with patient to try and get her scheduled? Patient has a scheduled appointment on Tuesday 08/02/21. Does patient need to come in sooner? Please advise?

## 2021-07-25 NOTE — Telephone Encounter (Signed)
Patient notified and verbalized understanding. 

## 2021-07-26 ENCOUNTER — Telehealth: Payer: Self-pay

## 2021-07-26 NOTE — Telephone Encounter (Signed)
Patient called complaining of cough, congestion and green mucus.  Please advise.

## 2021-07-27 DIAGNOSIS — M47817 Spondylosis without myelopathy or radiculopathy, lumbosacral region: Secondary | ICD-10-CM | POA: Diagnosis not present

## 2021-07-27 NOTE — Telephone Encounter (Signed)
Left message for patient to call and schedule follow up

## 2021-07-28 DIAGNOSIS — M0609 Rheumatoid arthritis without rheumatoid factor, multiple sites: Secondary | ICD-10-CM | POA: Diagnosis not present

## 2021-07-29 ENCOUNTER — Encounter: Payer: Self-pay | Admitting: Nurse Practitioner

## 2021-07-29 ENCOUNTER — Ambulatory Visit (INDEPENDENT_AMBULATORY_CARE_PROVIDER_SITE_OTHER): Payer: Medicare Other | Admitting: Nurse Practitioner

## 2021-07-29 DIAGNOSIS — R059 Cough, unspecified: Secondary | ICD-10-CM | POA: Diagnosis not present

## 2021-07-29 DIAGNOSIS — Z8616 Personal history of COVID-19: Secondary | ICD-10-CM | POA: Insufficient documentation

## 2021-07-29 MED ORDER — HYDROCOD POLST-CPM POLST ER 10-8 MG/5ML PO SUER: 5.0000 mL | Freq: Two times a day (BID) | ORAL | 0 refills | Status: DC | PRN

## 2021-07-29 MED ORDER — PREDNISONE 20 MG PO TABS: 40.0000 mg | ORAL_TABLET | Freq: Every day | ORAL | 0 refills | Status: DC

## 2021-07-29 MED ORDER — AMOXICILLIN-POT CLAVULANATE 875-125 MG PO TABS: 1.0000 | ORAL_TABLET | Freq: Two times a day (BID) | ORAL | 0 refills | Status: DC

## 2021-07-29 NOTE — Assessment & Plan Note (Signed)
History of 07/19/21 and has ongoing cough -- at this time will treat as bacterial post infection.  Obtain CXR -- she is aware of where to obtain.  Send in Augmentin for 7 days + Prednisone 40 MG daily for 5 days.  Script for Tussionex sent in for symptoms relief, aware not to take this with her Ativan.  PDMP review done.  Continue Tessalon as needed.  No SOB or CP reported, if present she is aware to go to ER.  Scheduled for Tuesday, will plan follow-up then.

## 2021-07-29 NOTE — Progress Notes (Signed)
There were no vitals taken for this visit.   Subjective:    Patient ID: Laura Rojas, female    DOB: [birth year], 75 y.o.   MRN: [id number]  HPI: Laura Rojas is a 75 y.o. female  Chief Complaint  Patient presents with   Cough    Patient states she is coughing constantly and states the cough medication (tessalon perles) will help for a little bit and then they will wear off. Patient states she is coughing up mucus and it has a green discoloration. Patient states she has tried OTC Delsym.    This visit was completed via telephone due to the restrictions of the COVID-19 pandemic. All issues as above were discussed and addressed but no physical exam was performed. If it was felt that the patient should be evaluated in the office, they were directed there. The patient verbally consented to this visit. Patient was unable to complete an audio/visual visit due to no access. Due to the catastrophic nature of the COVID-19 pandemic, this visit was done through audio contact only. Location of the patient: home Location of the provider: work Those involved with this call:  Provider: [name], DNP CMA: Irena Reichmann, [place] Desk/Registration: Barth Kirks  Time spent on call:  21 minutes on the phone discussing health concerns. 15 minutes total spent in review of patient's record and preparation of their chart.  I verified patient identity using two factors (patient name and date of birth). Patient consents verbally to being seen via telemedicine visit today.    HISTORY OF COVID Was diagnosed and treated with oral Molnupiravir for Covid on 07/19/21.  She reports ongoing cough post Covid.  Has been using Tessalon with some benefit, but is coughing up lots of greenish phlegm.  This is keeping her up at night. Fever: no Cough: yes Shortness of breath: no Wheezing: no Chest pain: no Chest tightness: no Chest congestion: no Nasal congestion: no Runny nose: yes Post nasal drip:  yes Sneezing: no Sore throat:  a little bit with  cough Swollen glands: no Sinus pressure: no Headache: no Face pain: no Toothache: no Ear pain: none Ear pressure: none Eyes red/itching:no Eye drainage/crusting: no  Vomiting: no Rash: no Fatigue: yes Sick contacts: no Strep contacts: no  Context: fluctuating Recurrent sinusitis: no Relief with OTC cold/cough medications: no  Treatments attempted: mucinex and cough syrup    Relevant past medical, surgical, family and social history reviewed and updated as indicated. Interim medical history since our last visit reviewed. Allergies and medications reviewed and updated.  Review of Systems  Constitutional:  Positive for fatigue. Negative for activity change, appetite change and fever.  HENT:  Positive for postnasal drip and rhinorrhea. Negative for congestion, ear discharge, ear pain, facial swelling, sinus pressure, sinus pain, sneezing, sore throat and voice change.   Eyes:  Negative for pain and visual disturbance.  Respiratory:  Positive for cough. Negative for chest tightness, shortness of breath and wheezing.   Cardiovascular:  Negative for chest pain, palpitations and leg swelling.  Gastrointestinal: Negative.   Neurological:  Negative for dizziness, numbness and headaches.  Psychiatric/Behavioral: Negative.     Per HPI unless specifically indicated above     Objective:    There were no vitals taken for this visit.  Wt Readings from Last 3 Encounters:  04/29/21 163 lb 9.6 oz (74.2 kg)  01/19/21 164 lb (74.4 kg)  01/11/21 164 lb (74.4 kg)    Physical Exam  Unable to perform  due to telephone only visit -- frequent, hacking, dry cough noted.  Results for orders placed or performed in visit on 07/19/21  Novel Coronavirus, NAA ([facility])   Specimen: Nasopharyngeal(NP) swabs in vial transport medium  Result Value Ref Range   SARS-CoV-2, NAA Detected (A) Not Detected  SARS-COV-2, NAA 2 DAY TAT  Result Value Ref Range    SARS-CoV-2, NAA 2 DAY TAT Performed       Assessment & Plan:   Problem List Items Addressed This Visit       Other   Cough    History of 07/19/21 and has ongoing cough -- at this time will treat as bacterial post infection.  Obtain CXR -- she is aware of where to obtain.  Send in Augmentin for 7 days + Prednisone 40 MG daily for 5 days.  Script for Tussionex sent in for symptoms relief, aware not to take this with her Ativan.  PDMP review done.  Continue Tessalon as needed.  No SOB or CP reported, if present she is aware to go to ER.  Scheduled for Tuesday, will plan follow-up then.      Relevant Orders   DG Chest 2 View   History of 2019 novel coronavirus disease (COVID-19) - Primary    History of 07/19/21 and has ongoing cough -- at this time will treat as bacterial post infection.  Obtain CXR -- she is aware of where to obtain.  Send in Augmentin for 7 days + Prednisone 40 MG daily for 5 days.  Script for Tussionex sent in for symptoms relief, aware not to take this with her Ativan.  PDMP review done.  Continue Tessalon as needed.  No SOB or CP reported, if present she is aware to go to ER.  Scheduled for Tuesday, will plan follow-up then.      Relevant Orders   DG Chest 2 View    I discussed the assessment and treatment plan with the patient. The patient was provided an opportunity to ask questions and all were answered. The patient agreed with the plan and demonstrated an understanding of the instructions.   The patient was advised to call back or seek an in-person evaluation if the symptoms worsen or if the condition fails to improve as anticipated.   I provided 21+ minutes of time during this encounter.   Follow up plan: Return for as scheduled on Tuesday.

## 2021-07-29 NOTE — Patient Instructions (Signed)

## 2021-08-02 ENCOUNTER — Ambulatory Visit
Admission: RE | Admit: 2021-08-02 | Discharge: 2021-08-02 | Disposition: A | Discharge status: Home / Self Care | Payer: Medicare Other | Source: Home / Self Care | Attending: Nurse Practitioner | Admitting: Nurse Practitioner

## 2021-08-02 ENCOUNTER — Ambulatory Visit
Admission: RE | Admit: 2021-08-02 | Discharge: 2021-08-02 | Disposition: A | Discharge status: Home / Self Care | Payer: Medicare Other | Source: Ambulatory Visit | Attending: Nurse Practitioner | Admitting: Nurse Practitioner

## 2021-08-02 ENCOUNTER — Encounter: Payer: Self-pay | Admitting: Nurse Practitioner

## 2021-08-02 ENCOUNTER — Ambulatory Visit (INDEPENDENT_AMBULATORY_CARE_PROVIDER_SITE_OTHER): Payer: Medicare Other | Admitting: Nurse Practitioner

## 2021-08-02 ENCOUNTER — Other Ambulatory Visit: Payer: Self-pay

## 2021-08-02 VITALS — BP 105/68 | HR 77 | Temp 98.7°F | Wt 161.2 lb

## 2021-08-02 DIAGNOSIS — E785 Hyperlipidemia, unspecified: Secondary | ICD-10-CM

## 2021-08-02 DIAGNOSIS — F339 Major depressive disorder, recurrent, unspecified: Secondary | ICD-10-CM

## 2021-08-02 DIAGNOSIS — Z794 Long term (current) use of insulin: Secondary | ICD-10-CM | POA: Diagnosis not present

## 2021-08-02 DIAGNOSIS — D509 Iron deficiency anemia, unspecified: Secondary | ICD-10-CM | POA: Diagnosis not present

## 2021-08-02 DIAGNOSIS — R059 Cough, unspecified: Secondary | ICD-10-CM | POA: Insufficient documentation

## 2021-08-02 DIAGNOSIS — F413 Other mixed anxiety disorders: Secondary | ICD-10-CM

## 2021-08-02 DIAGNOSIS — N183 Chronic kidney disease, stage 3 unspecified: Secondary | ICD-10-CM

## 2021-08-02 DIAGNOSIS — K219 Gastro-esophageal reflux disease without esophagitis: Secondary | ICD-10-CM | POA: Diagnosis not present

## 2021-08-02 DIAGNOSIS — I771 Stricture of artery: Secondary | ICD-10-CM

## 2021-08-02 DIAGNOSIS — E1169 Type 2 diabetes mellitus with other specified complication: Secondary | ICD-10-CM

## 2021-08-02 DIAGNOSIS — E1159 Type 2 diabetes mellitus with other circulatory complications: Secondary | ICD-10-CM | POA: Diagnosis not present

## 2021-08-02 DIAGNOSIS — I25708 Atherosclerosis of coronary artery bypass graft(s), unspecified, with other forms of angina pectoris: Secondary | ICD-10-CM | POA: Diagnosis not present

## 2021-08-02 DIAGNOSIS — E1165 Type 2 diabetes mellitus with hyperglycemia: Secondary | ICD-10-CM | POA: Diagnosis not present

## 2021-08-02 DIAGNOSIS — E1122 Type 2 diabetes mellitus with diabetic chronic kidney disease: Secondary | ICD-10-CM | POA: Diagnosis not present

## 2021-08-02 DIAGNOSIS — I152 Hypertension secondary to endocrine disorders: Secondary | ICD-10-CM

## 2021-08-02 DIAGNOSIS — Z8616 Personal history of COVID-19: Secondary | ICD-10-CM | POA: Diagnosis not present

## 2021-08-02 DIAGNOSIS — Z79899 Other long term (current) drug therapy: Secondary | ICD-10-CM

## 2021-08-02 DIAGNOSIS — R051 Acute cough: Secondary | ICD-10-CM

## 2021-08-02 DIAGNOSIS — M353 Polymyalgia rheumatica: Secondary | ICD-10-CM | POA: Diagnosis not present

## 2021-08-02 LAB — BAYER DCA HB A1C WAIVED: HB A1C (BAYER DCA - WAIVED): 8.2 % — ABNORMAL HIGH (ref 4.8–5.6)

## 2021-08-02 MED ORDER — ALBUTEROL SULFATE (2.5 MG/3ML) 0.083% IN NEBU
2.5000 mg | INHALATION_SOLUTION | Freq: Once | RESPIRATORY_TRACT | Status: AC
  Administered 2021-08-02: 2.5 mg via RESPIRATORY_TRACT

## 2021-08-02 MED ORDER — PREDNISONE 20 MG PO TABS: 40.0000 mg | ORAL_TABLET | Freq: Every day | ORAL | 0 refills | Status: AC

## 2021-08-02 MED ORDER — IPRATROPIUM-ALBUTEROL 0.5-2.5 (3) MG/3ML IN SOLN: 3.0000 mL | Freq: Once | RESPIRATORY_TRACT | Status: DC

## 2021-08-02 MED ORDER — AMOXICILLIN-POT CLAVULANATE 875-125 MG PO TABS: 1.0000 | ORAL_TABLET | Freq: Two times a day (BID) | ORAL | 0 refills | Status: AC

## 2021-08-02 MED ORDER — AZITHROMYCIN 250 MG PO TABS: ORAL_TABLET | ORAL | 0 refills | Status: AC

## 2021-08-02 MED ORDER — LORAZEPAM 0.5 MG PO TABS: ORAL_TABLET | ORAL | 0 refills | Status: DC

## 2021-08-02 MED ORDER — ALBUTEROL SULFATE HFA 108 (90 BASE) MCG/ACT IN AERS: 2.0000 | INHALATION_SPRAY | Freq: Four times a day (QID) | RESPIRATORY_TRACT | 0 refills | Status: DC | PRN

## 2021-08-02 NOTE — Assessment & Plan Note (Signed)
Continue collaboration with cardiology and preventative treatment.

## 2021-08-02 NOTE — Assessment & Plan Note (Signed)
Chronic, stable.  Continue current medication regimen and adjust as needed.  Would benefit from reduction trial in future.  Check Mag level today.

## 2021-08-02 NOTE — Assessment & Plan Note (Signed)
Stable, continue collaboration with cardiology and preventative treatment ASA, Plavix, and statin.

## 2021-08-02 NOTE — Assessment & Plan Note (Signed)
Refer to cough plan of care.  CBC today.

## 2021-08-02 NOTE — Patient Instructions (Signed)

## 2021-08-02 NOTE — Progress Notes (Signed)
BP 105/68   Pulse 77   Temp 98.7 F (37.1 C) (Oral)   Wt 161 lb 3.2 oz (73.1 kg)   SpO2 95%   BMI 26.83 kg/m    Subjective:    Patient ID: Laura Rojas, female    DOB: 1946-06-06, 75 y.o.   MRN: [id number]  HPI: Laura Rojas is a 75 y.o. female  Chief Complaint  Patient presents with   Hyperlipidemia   Hypertension   Diabetes   Gastroesophageal Reflux   Chronic Kidney Disease   Cough    Patient states she is still having coughing day and night. Patient states the cough medication did not help this time around. Patient states she is still coughing up greenish mucus. Patient denies trying any medication over the counter. Patient states sometimes when she coughs while eating it causes her to vomit.     HISTORY OF COVID Was diagnosed and treated with oral Molnupiravir for Covid on 07/19/21.  She reports ongoing cough post Covid.  Went for imaging today, not resulted as of yet.  Sent in abx, Augmentin, on Friday and Prednisone -- she reports pharmacy only gave her Prednisone and cough syrup. Fever: no Cough: yes Shortness of breath: no Wheezing: no Chest pain: no Chest tightness: no Chest congestion: no Nasal congestion: no Runny nose: yes Post nasal drip: yes Sneezing: no Sore throat:  a little bit with  cough Swollen glands: no Sinus pressure: no Headache: no Face pain: no Toothache: no Ear pain: none Ear pressure: none Eyes red/itching:no Eye drainage/crusting: no  Vomiting: no Rash: no Fatigue: yes Sick contacts: no Strep contacts: no  Context: fluctuating Recurrent sinusitis: no Relief with OTC cold/cough medications: no  Treatments attempted: mucinex and cough syrup    DIABETES Followed by endocrinology and last saw [facility] 05/16/21 with Basaglar increased to 20 units.  Her last A1c was 8.2% in June.  She continues on Basaglar, Novolog sliding scale, and Metformin.  Did have recent steroid injection in hip and steroids for recent cough post  Covid. Hypoglycemic episodes: a few  Polydipsia/polyuria: no Visual disturbance: no Chest pain: no Paresthesias: no Glucose Monitoring: yes             Accucheck frequency: Daily             Fasting glucose: 140 to 200 range             Post prandial:             Evening:              Before meals: Taking Insulin?: yes             Long acting insulin: 20 units             Short acting insulin: siding scale units with meals  Blood Pressure Monitoring: rarely Retinal Examination: Not Up to Date Foot Exam: Up to Date Pneumovax: Up to Date Influenza: Up to Date Aspirin: yes   HYPERTENSION / HYPERLIPIDEMIA/CAD She is followed by cardiology, Dr. Sallyanne Kuster. Last saw 10/07/20.  Cardiology follows due to subclavian artery stenosis and angina. Continues on Amlodipine, Plavix, HCTZ, Fish oil, Losartan, Lipitor.  Satisfied with current treatment? yes Duration of hypertension: chronic BP monitoring frequency: not checking BP range:not checking BP medication side effects: no Duration of hyperlipidemia: chronic Cholesterol medication side effects: no Cholesterol supplements: fish oil Medication compliance: good compliance Aspirin: yes Recent stressors: no Recurrent headaches: no Visual changes: no Palpitations: no Dyspnea:  no Chest pain: no Lower extremity edema: no Dizzy/lightheaded: no    CHRONIC KIDNEY DISEASE Last labs GFR 56 and CRT 0.99. CKD status: stable Medications renally dose: yes Previous renal evaluation: no Pneumovax:  Up to Date Influenza Vaccine:  Up to Date   CHRONIC JOINT PAIN On review is followed by rheumatology with diagnosis of polymyalgia rheumatica syndrome. Saw last 04/27/21, having infusions.   Duration: chronic Involved joints: right hand, both feet, and left knee Mechanism of injury: arthritis Severity: 3/10 at worst Quality:  dull, aching and burning Frequency: intermittent  Radiation: no Aggravating factors: walking and movement  Alleviating  factors: Aspercreme and APAP  Status: fluctuating Treatments attempted: Tylenol and Aspercreme Relief with NSAIDs?:  No NSAIDs Taken Weakness with weight bearing or walking: no Sensation of giving way: no Locking: no Popping: no Bruising: no Swelling: no Redness: no Paresthesias/decreased sensation: no Fevers: no   ANXIETY/STRESS Taking Zoloft 150 MG daily.  Is taking Ativan 0.5 MG, takes this at night only, although ordered BID -- currently reports she takes 1 MG at night. Occasionally will take extra dose at day time, but not often.  Pt aware of risks of benzo medication use to include increased sedation, respiratory suppression, falls, dependence and cardiovascular events.  Pt would like to continue treatment as benefit determined to outweigh risk.  On review PDMP last Ativan fill 04/22/21 for 3 months supply, previous PCP ordered this way. Duration:stable Anxious mood: yes  Excessive worrying: no Irritability: no  Sweating: no Nausea: no Palpitations:no Hyperventilation: no Panic attacks: no Agoraphobia: no  Obscessions/compulsions: no Depressed mood: no Depression screen [facility] 2/9 08/02/2021 04/29/2021 04/29/2021 01/19/2021 10/13/2020  Decreased Interest 0 0 0 0 0  Down, Depressed, Hopeless 0 0 0 0 1  PHQ - 2 Score 0 0 0 0 1  Altered sleeping '1 3 3 3 1  '$ Tired, decreased energy '1 3 3 2 '$ 0  Change in appetite - 1 1 0 1  Feeling bad or failure about yourself  0 0 0 0 0  Trouble concentrating 0 0 0 0 0  Moving slowly or fidgety/restless 0 0 0 0 0  Suicidal thoughts 0 0 0 0 0  PHQ-9 Score '2 7 7 5 3  '$ Difficult doing work/chores Not difficult at all - Not difficult at all - Not difficult at all  Some recent data might be hidden  Anhedonia: no Weight changes: no Insomnia: yes hard to fall asleep  Hypersomnia: no Fatigue/loss of energy: no Feelings of worthlessness: no Feelings of guilt: no Impaired concentration/indecisiveness: no Suicidal ideations: no  Crying spells: no Recent  Stressors/Life Changes: no   Relationship problems: no   Family stress: no     Financial stress: no    Job stress: no    Recent death/loss: no GAD 7 : Generalized Anxiety Score 08/02/2021 04/29/2021 10/13/2020 03/17/2020  Nervous, Anxious, on Edge 0 1 0 0  Control/stop worrying 1 1 0 0  Worry too much - different things 1 1 0 0  Trouble relaxing 0 1 0 0  Restless 0 0 0 0  Easily annoyed or irritable 0 0 0 0  Afraid - awful might happen 0 0 0 0  Total GAD 7 Score 2 4 0 0  Anxiety Difficulty Not difficult at all Not difficult at all - Not difficult at all   Relevant past medical, surgical, family and social history reviewed and updated as indicated. Interim medical history since our last visit reviewed. Allergies and medications reviewed  and updated.  Review of Systems  Constitutional:  Negative for activity change, appetite change, diaphoresis, fatigue and fever.  Respiratory:  Negative for cough, chest tightness and shortness of breath.   Cardiovascular:  Negative for chest pain, palpitations and leg swelling.  Gastrointestinal: Negative.   Endocrine: Negative for polydipsia, polyphagia and polyuria.  Neurological: Negative.   Psychiatric/Behavioral: Negative.     Per HPI unless specifically indicated above     Objective:    BP 105/68   Pulse 77   Temp 98.7 F (37.1 C) (Oral)   Wt 161 lb 3.2 oz (73.1 kg)   SpO2 95%   BMI 26.83 kg/m   Wt Readings from Last 3 Encounters:  08/02/21 161 lb 3.2 oz (73.1 kg)  04/29/21 163 lb 9.6 oz (74.2 kg)  01/19/21 164 lb (74.4 kg)    Physical Exam Vitals and nursing note reviewed.  Constitutional:      General: She is awake. She is not in acute distress.    Appearance: She is well-developed, well-groomed and overweight. She is not ill-appearing.  HENT:     Head: Normocephalic.     Right Ear: Hearing normal.     Left Ear: Hearing normal.  Eyes:     General: Lids are normal.        Right eye: No discharge.        Left eye: No  discharge.     Conjunctiva/sclera: Conjunctivae normal.     Pupils: Pupils are equal, round, and reactive to light.  Neck:     Thyroid: No thyromegaly.     Vascular: Carotid bruit (bilateral) present.  Cardiovascular:     Rate and Rhythm: Normal rate and regular rhythm.     Heart sounds: Normal heart sounds. No murmur heard.   No gallop.  Pulmonary:     Effort: Pulmonary effort is normal. No accessory muscle usage or respiratory distress.     Breath sounds: Examination of the left-lower field reveals rhonchi. Wheezing and rhonchi present.     Comments: Scattered intermittent expiratory wheezes throughout with rhonchi noted to left lower base.  Frequent, hacking, non productive cough with mild hoarseness noted. Abdominal:     General: Bowel sounds are normal.     Palpations: Abdomen is soft.  Musculoskeletal:     Cervical back: Normal range of motion and neck supple.     Right lower leg: No edema.     Left lower leg: No edema.  Skin:    General: Skin is warm and dry.  Neurological:     Mental Status: She is alert and oriented to person, place, and time.  Psychiatric:        Attention and Perception: Attention normal.        Mood and Affect: Mood normal.        Speech: Speech normal.        Behavior: Behavior normal. Behavior is cooperative.        Thought Content: Thought content normal.   Results for orders placed or performed in visit on 07/19/21  Novel Coronavirus, NAA ([facility])   Specimen: Nasopharyngeal(NP) swabs in vial transport medium  Result Value Ref Range   SARS-CoV-2, NAA Detected (A) Not Detected  SARS-COV-2, NAA 2 DAY TAT  Result Value Ref Range   SARS-CoV-2, NAA 2 DAY TAT Performed       Assessment & Plan:   Problem List Items Addressed This Visit       Cardiovascular and Mediastinum   Hypertension associated with  diabetes ([place])    Chronic, stable with BP at goal in office today.  Continue current medication regimen and collaboration with cardiology. CMP  on labs today.  Recommend she monitor BP at least three days a week at home and document for providers + bring to visits.  DASH diet focus.  Return in 3 months.      Relevant Orders   Basic metabolic panel   Bayer DCA Hb A1c Waived   Subclavian artery stenosis, left (HCC)    Stable, continue collaboration with cardiology and preventative treatment ASA, Plavix, and statin.      Coronary artery disease of bypass graft of native heart with stable angina pectoris [facility])    Continue collaboration with cardiology and preventative treatment.      Relevant Medications   predniSONE (DELTASONE) 20 MG tablet     Digestive   Gastroesophageal reflux disease without esophagitis    Chronic, stable.  Continue current medication regimen and adjust as needed.  Would benefit from reduction trial in future.  Check Mag level today.      Relevant Orders   Magnesium     Endocrine   Hyperlipidemia associated with type 2 diabetes mellitus (HCC)    Chronic, ongoing.  Continue current medication regimen and adjust as needed.  Lipid panel today.  Discussed with patient, if myalgias with Atorvastatin, + her history of myalgia with statins, could consider Repatha initiation if poor tolerance to new regimen.      Relevant Orders   Bayer DCA Hb A1c Waived   Lipid Panel w/o Chol/HDL Ratio   CKD stage 3 due to type 2 diabetes mellitus (HCC)    Chronic, stable.  Continue Losartan for kidney protection.  Urine micro in office February 2022 =  ALB 30 and A:C <30. Continue collaboration with cardiology and endocrinology.  Consider nephrology referral if worsening function.  CMP today.      Relevant Orders   Basic metabolic panel   Bayer DCA Hb A1c Waived   Type 2 diabetes mellitus with hyperglycemia, with long-term current use of insulin (HCC) - Primary    Chronic, ongoing, followed by endocrinology with A1C today 8.2%, similar to past even with steroids on board.  Continue this collaboration and current  medication regimen, defer changes to endo at this time (she sees them upcoming) + collaboration with CCM team in office.  Would benefit from trial of SGLT in future (especially with cardiac health), will attempt to discuss with endocrinology.  Bring Libre to next visit.  Return to office in 3 months.      Relevant Orders   Basic metabolic panel   Bayer DCA Hb A1c Waived     Other   Cough    History of Covid 07/19/21 and has ongoing cough -- at this time will treat as bacterial post infection.  CXR done today and awaiting results, ?PNA.  Never got Augmentin on Friday -- will resend and add on Zpack + 3 more days Prednisone and Albuterol inhaler.  Script for Tussionex sent in last visit for symptom relief, aware not to take this with her Ativan.  PDMP review done.  Continue Tessalon as needed.  No SOB or CP reported, if present she is aware to go to ER.  Schedule to return Friday for follow-up with goal to avoid hospital setting if possible.      Relevant Medications   ipratropium-albuterol (DUONEB) 0.5-2.5 (3) MG/3ML nebulizer solution 3 mL   Anxiety disorder    Chronic, ongoing.  Denies SI/HI.  Continue Sertraline and Ativan.  Has had multiple attempts at reduction of benzo with no success.  Discussed at length risks with her and she is aware.  Continue Ativan and fill upon request.  PDMP reviewed.  Return in 3 months, has up to date UDS (done last visit) and contract.      Relevant Medications   LORazepam (ATIVAN) 0.5 MG tablet   Fe deficiency anemia    Ongoing, continue daily supplement and check iron/ferritin + CBC today.      Relevant Orders   CBC with Differential/Platelet   Iron, TIBC and Ferritin Panel   Polymyalgia rheumatica (HCC)    Chronic, ongoing.  Continue collaboration with rheumatology and current medication regimen.        Depression, recurrent (HCC)    Chronic, ongoing.  Denies SI/HI.  Continue Sertraline 150 MG daily as is ordered. This will benefit mood overall and  help reduce benzo use.  Consider increase to 200 MG in future for increased benefit.  Return in 3 months.      Relevant Medications   LORazepam (ATIVAN) 0.5 MG tablet   Long-term current use of benzodiazepine    Chronic, ongoing for insomnia.  Has had trials of other medications and reductions with poor outcomes.  Fully aware of risks of chronic benzo use and aware this will be discussed each visit.  UDS up to date.      History of 2019 novel coronavirus disease (COVID-19)    Refer to cough plan of care.  CBC today.      Relevant Medications   ipratropium-albuterol (DUONEB) 0.5-2.5 (3) MG/3ML nebulizer solution 3 mL   Other Relevant Orders   CBC with Differential/Platelet     Follow up plan: Return in about 3 days (around 08/05/2021) for Cough.

## 2021-08-02 NOTE — Assessment & Plan Note (Signed)
Chronic, stable with BP at goal in office today.  Continue current medication regimen and collaboration with cardiology. CMP on labs today.  Recommend she monitor BP at least three days a week at home and document for providers + bring to visits.  DASH diet focus.  Return in 3 months.

## 2021-08-02 NOTE — Assessment & Plan Note (Signed)
Ongoing, continue daily supplement and check iron/ferritin + CBC today.

## 2021-08-02 NOTE — Assessment & Plan Note (Signed)
Chronic, ongoing.  Continue current medication regimen and adjust as needed.  Lipid panel today.  Discussed with patient, if myalgias with Atorvastatin, + her history of myalgia with statins, could consider Repatha initiation if poor tolerance to new regimen.

## 2021-08-02 NOTE — Assessment & Plan Note (Signed)
Chronic, ongoing.  Continue collaboration with rheumatology and current medication regimen.

## 2021-08-02 NOTE — Assessment & Plan Note (Signed)
Chronic, stable.  Continue Losartan for kidney protection.  Urine micro in office February 2022 =  ALB 30 and A:C <30. Continue collaboration with cardiology and endocrinology.  Consider nephrology referral if worsening function.  CMP today.

## 2021-08-02 NOTE — Assessment & Plan Note (Signed)
Chronic, ongoing for insomnia.  Has had trials of other medications and reductions with poor outcomes.  Fully aware of risks of chronic benzo use and aware this will be discussed each visit.  UDS up to date.

## 2021-08-02 NOTE — Assessment & Plan Note (Signed)
Chronic, ongoing.  Denies SI/HI.  Continue Sertraline and Ativan.  Has had multiple attempts at reduction of benzo with no success.  Discussed at length risks with her and she is aware.  Continue Ativan and fill upon request.  PDMP reviewed.  Return in 3 months, has up to date UDS (done last visit) and contract.

## 2021-08-02 NOTE — Assessment & Plan Note (Signed)
Chronic, ongoing.  Denies SI/HI.  Continue Sertraline 150 MG daily as is ordered. This will benefit mood overall and help reduce benzo use.  Consider increase to 200 MG in future for increased benefit.  Return in 3 months.

## 2021-08-02 NOTE — Assessment & Plan Note (Signed)
Chronic, ongoing, followed by endocrinology with A1C today 8.2%, similar to past even with steroids on board.  Continue this collaboration and current medication regimen, defer changes to endo at this time (she sees them upcoming) + collaboration with CCM team in office.  Would benefit from trial of SGLT in future (especially with cardiac health), will attempt to discuss with endocrinology.  Bring Libre to next visit.  Return to office in 3 months.

## 2021-08-02 NOTE — Assessment & Plan Note (Signed)
History of Covid 07/19/21 and has ongoing cough -- at this time will treat as bacterial post infection.  CXR done today and awaiting results, ?PNA.  Never got Augmentin on Friday -- will resend and add on Zpack + 3 more days Prednisone and Albuterol inhaler.  Script for Tussionex sent in last visit for symptom relief, aware not to take this with her Ativan.  PDMP review done.  Continue Tessalon as needed.  No SOB or CP reported, if present she is aware to go to ER.  Schedule to return Friday for follow-up with goal to avoid hospital setting if possible.

## 2021-08-03 LAB — CBC WITH DIFFERENTIAL/PLATELET
Basophils Absolute: 0 10*3/uL (ref 0.0–0.2)
Basos: 0 %
EOS (ABSOLUTE): 0 10*3/uL (ref 0.0–0.4)
Eos: 0 %
Hematocrit: 35.4 % (ref 34.0–46.6)
Hemoglobin: 11.4 g/dL (ref 11.1–15.9)
Immature Grans (Abs): 0.1 10*3/uL (ref 0.0–0.1)
Immature Granulocytes: 1 %
Lymphocytes Absolute: 1.6 10*3/uL (ref 0.7–3.1)
Lymphs: 12 %
MCH: 27.2 pg (ref 26.6–33.0)
MCHC: 32.2 g/dL (ref 31.5–35.7)
MCV: 85 fL (ref 79–97)
Monocytes Absolute: 0.3 10*3/uL (ref 0.1–0.9)
Monocytes: 2 %
Neutrophils Absolute: 11.7 10*3/uL — ABNORMAL HIGH (ref 1.4–7.0)
Neutrophils: 85 %
Platelets: 487 10*3/uL — ABNORMAL HIGH (ref 150–450)
RBC: 4.19 x10E6/uL (ref 3.77–5.28)
RDW: 14.4 % (ref 11.7–15.4)
WBC: 13.7 10*3/uL — ABNORMAL HIGH (ref 3.4–10.8)

## 2021-08-03 LAB — LIPID PANEL W/O CHOL/HDL RATIO
Cholesterol, Total: 150 mg/dL (ref 100–199)
HDL: 62 mg/dL (ref >39)
LDL Chol Calc (NIH): 56 mg/dL (ref 0–99)
Triglycerides: 201 mg/dL — ABNORMAL HIGH (ref 0–149)
VLDL Cholesterol Cal: 32 mg/dL (ref 5–40)

## 2021-08-03 LAB — IRON,TIBC AND FERRITIN PANEL
Ferritin: 74 ng/mL (ref 15–150)
Iron Saturation: 17 % (ref 15–55)
Iron: 49 ug/dL (ref 27–139)
Total Iron Binding Capacity: 293 ug/dL (ref 250–450)
UIBC: 244 ug/dL (ref 118–369)

## 2021-08-03 LAB — BASIC METABOLIC PANEL
BUN/Creatinine Ratio: 20 (ref 12–28)
BUN: 22 mg/dL (ref 8–27)
CO2: 21 mmol/L (ref 20–29)
Calcium: 9.6 mg/dL (ref 8.7–10.3)
Chloride: 99 mmol/L (ref 96–106)
Creatinine, Ser: 1.12 mg/dL — ABNORMAL HIGH (ref 0.57–1.00)
Glucose: 193 mg/dL — ABNORMAL HIGH (ref 65–99)
Potassium: 4 mmol/L (ref 3.5–5.2)
Sodium: 138 mmol/L (ref 134–144)
eGFR: 51 mL/min/{1.73_m2} — ABNORMAL LOW (ref >59)

## 2021-08-03 LAB — MAGNESIUM: Magnesium: 1.7 mg/dL (ref 1.6–2.3)

## 2021-08-03 NOTE — Progress Notes (Signed)
Contacted via MyChart   Good evening Laura Rojas, hope you are starting to feel better.  Your labs have returned: - Kidney function, creatinine and eGFR, continues to show some mild kidney disease.  No worsening though. - LDL continues to show goal level, continue your statin every day.  Triglycerides a little elevated which we will continue to monitor closely. - CBC does show elevation in white blood cell count and neutrophils + mild elevation in platelets.  I am glad we have you on antibiotic as appears a bacterial infection is present post Covid.  Continue these medications until complete. - Iron level stable and magnesium normal.  Any questions? Keep being amazing!!  Thank you for allowing me to participate in your care.  I appreciate you. Kindest regards, Aruna Nestler

## 2021-08-05 ENCOUNTER — Encounter: Payer: Self-pay | Admitting: Nurse Practitioner

## 2021-08-05 ENCOUNTER — Ambulatory Visit (INDEPENDENT_AMBULATORY_CARE_PROVIDER_SITE_OTHER): Payer: Medicare Other | Admitting: Nurse Practitioner

## 2021-08-05 ENCOUNTER — Other Ambulatory Visit: Payer: Self-pay

## 2021-08-05 DIAGNOSIS — R059 Cough, unspecified: Secondary | ICD-10-CM | POA: Diagnosis not present

## 2021-08-05 DIAGNOSIS — I25708 Atherosclerosis of coronary artery bypass graft(s), unspecified, with other forms of angina pectoris: Secondary | ICD-10-CM

## 2021-08-05 MED ORDER — HYDROCOD POLST-CPM POLST ER 10-8 MG/5ML PO SUER: 5.0000 mL | Freq: Two times a day (BID) | ORAL | 0 refills | Status: DC | PRN

## 2021-08-05 NOTE — Progress Notes (Signed)
BP 131/80   Pulse 79   Temp 98.1 F (36.7 C) (Oral)   Wt 160 lb (72.6 kg)   SpO2 94%   BMI 26.63 kg/m    Subjective:    Patient ID: Laura Rojas, female    DOB: 1945-12-13, 75 y.o.   MRN: [id number]  HPI: Laura Rojas is a 75 y.o. female  Chief Complaint  Patient presents with   Cough    Patient is here for a 3 day follow up on cough. Patient states she is feeling better and she states her cough has gotten better and she states the medication. Patient states she received her inhaler today as she did not received it Tuesday.     HISTORY OF COVID Started on abx therapy, with Zpack and Augmentin, on 07/29/21 due to worsening cough post Covid.  Was diagnosed and treated with oral Molnupiravir for Covid on 07/19/21.  Her imaging did not show any pneumonia, but WBC and neutrophils were elevated on 07/29/21.    She needs a little more cough syrup.  Sleeping better at night now and cough improving. Fever: no Cough: yes -- improving Shortness of breath: no Wheezing: no Chest pain: no Chest tightness: no Chest congestion: no Nasal congestion: no Runny nose: none Post nasal drip: none Sneezing: no Sore throat:  improved Swollen glands: no Sinus pressure: no Headache: no Face pain: no Toothache: no Ear pain: none Ear pressure: none Eyes red/itching:no Eye drainage/crusting: no  Vomiting: no Rash: no Fatigue: yes Sick contacts: no Strep contacts: no  Context: fluctuating Recurrent sinusitis: no Relief with OTC cold/cough medications: no  Treatments attempted: mucinex and cough syrup, abx therapy  Relevant past medical, surgical, family and social history reviewed and updated as indicated. Interim medical history since our last visit reviewed. Allergies and medications reviewed and updated.  Review of Systems  Constitutional:  Negative for activity change, appetite change, diaphoresis, fatigue and fever.  Respiratory:  Positive for cough. Negative for chest tightness  and shortness of breath.   Cardiovascular:  Negative for chest pain, palpitations and leg swelling.  Gastrointestinal: Negative.   Endocrine: Negative for polydipsia, polyphagia and polyuria.  Neurological: Negative.   Psychiatric/Behavioral: Negative.     Per HPI unless specifically indicated above     Objective:    BP 131/80   Pulse 79   Temp 98.1 F (36.7 C) (Oral)   Wt 160 lb (72.6 kg)   SpO2 94%   BMI 26.63 kg/m   Wt Readings from Last 3 Encounters:  08/05/21 160 lb (72.6 kg)  08/02/21 161 lb 3.2 oz (73.1 kg)  04/29/21 163 lb 9.6 oz (74.2 kg)    Physical Exam Vitals and nursing note reviewed.  Constitutional:      General: She is awake. She is not in acute distress.    Appearance: She is well-developed, well-groomed and overweight. She is not ill-appearing.  HENT:     Head: Normocephalic.     Right Ear: Hearing normal.     Left Ear: Hearing normal.  Eyes:     General: Lids are normal.        Right eye: No discharge.        Left eye: No discharge.     Conjunctiva/sclera: Conjunctivae normal.     Pupils: Pupils are equal, round, and reactive to light.  Neck:     Thyroid: No thyromegaly.     Vascular: Carotid bruit (bilateral) present.  Cardiovascular:     Rate and Rhythm:  Normal rate and regular rhythm.     Heart sounds: Normal heart sounds. No murmur heard.   No gallop.  Pulmonary:     Effort: Pulmonary effort is normal. No accessory muscle usage or respiratory distress.     Breath sounds: Normal breath sounds. No wheezing or rhonchi.     Comments: Much improvement today on exam, no wheezes or rhonchi, clear throughout and minimal cough noted.  No SOB with talking. Abdominal:     General: Bowel sounds are normal.     Palpations: Abdomen is soft.  Musculoskeletal:     Cervical back: Normal range of motion and neck supple.     Right lower leg: No edema.     Left lower leg: No edema.  Skin:    General: Skin is warm and dry.  Neurological:     Mental  Status: She is alert and oriented to person, place, and time.  Psychiatric:        Attention and Perception: Attention normal.        Mood and Affect: Mood normal.        Speech: Speech normal.        Behavior: Behavior normal. Behavior is cooperative.        Thought Content: Thought content normal.   Results for orders placed or performed in visit on 64/40/34  Basic metabolic panel  Result Value Ref Range   Glucose 193 (H) 65 - 99 mg/dL   BUN 22 8 - 27 mg/dL   Creatinine, Ser 1.12 (H) 0.57 - 1.00 mg/dL   eGFR 51 (L) >59 mL/min/1.73   BUN/Creatinine Ratio 20 12 - 28   Sodium 138 134 - 144 mmol/L   Potassium 4.0 3.5 - 5.2 mmol/L   Chloride 99 96 - 106 mmol/L   CO2 21 20 - 29 mmol/L   Calcium 9.6 8.7 - 10.3 mg/dL  Bayer DCA Hb A1c Waived  Result Value Ref Range   HB A1C (BAYER DCA - WAIVED) 8.2 (H) 4.8 - 5.6 %  Lipid Panel w/o Chol/HDL Ratio  Result Value Ref Range   Cholesterol, Total 150 100 - 199 mg/dL   Triglycerides 201 (H) 0 - 149 mg/dL   HDL 62 >39 mg/dL   VLDL Cholesterol Cal 32 5 - 40 mg/dL   LDL Chol Calc (NIH) 56 0 - 99 mg/dL  CBC with Differential/Platelet  Result Value Ref Range   WBC 13.7 (H) 3.4 - 10.8 x10E3/uL   RBC 4.19 3.77 - 5.28 x10E6/uL   Hemoglobin 11.4 11.1 - 15.9 g/dL   Hematocrit 35.4 34.0 - 46.6 %   MCV 85 79 - 97 fL   MCH 27.2 26.6 - 33.0 pg   MCHC 32.2 31.5 - 35.7 g/dL   RDW 14.4 11.7 - 15.4 %   Platelets 487 (H) 150 - 450 x10E3/uL   Neutrophils 85 Not Estab. %   Lymphs 12 Not Estab. %   Monocytes 2 Not Estab. %   Eos 0 Not Estab. %   Basos 0 Not Estab. %   Neutrophils Absolute 11.7 (H) 1.4 - 7.0 x10E3/uL   Lymphocytes Absolute 1.6 0.7 - 3.1 x10E3/uL   Monocytes Absolute 0.3 0.1 - 0.9 x10E3/uL   EOS (ABSOLUTE) 0.0 0.0 - 0.4 x10E3/uL   Basophils Absolute 0.0 0.0 - 0.2 x10E3/uL   Immature Granulocytes 1 Not Estab. %   Immature Grans (Abs) 0.1 0.0 - 0.1 x10E3/uL  Iron, TIBC and Ferritin Panel  Result Value Ref Range   Total Iron  Binding  Capacity 293 250 - 450 ug/dL   UIBC 244 118 - 369 ug/dL   Iron 49 27 - 139 ug/dL   Iron Saturation 17 15 - 55 %   Ferritin 74 15 - 150 ng/mL  Magnesium  Result Value Ref Range   Magnesium 1.7 1.6 - 2.3 mg/dL      Assessment & Plan:   Problem List Items Addressed This Visit       Other   Cough    Acute and improving at this time, Covid on 07/19/21.  Is improving with abx therapy, at this time recommend she complete course of therapy.  Will send in refills on Tussionex to use as needed, PDMP reviewed.  She is aware not to take at same time as her Ativan.  Return to office if any worsening or ongoing symptoms.        Follow up plan: Return in about 3 months (around 11/04/2021) for T2DM, HTN/HLD, MOOD.

## 2021-08-05 NOTE — Assessment & Plan Note (Signed)
Acute and improving at this time, Covid on 07/19/21.  Is improving with abx therapy, at this time recommend she complete course of therapy.  Will send in refills on Tussionex to use as needed, PDMP reviewed.  She is aware not to take at same time as her Ativan.  Return to office if any worsening or ongoing symptoms.

## 2021-08-05 NOTE — Patient Instructions (Signed)

## 2021-08-22 ENCOUNTER — Telehealth: Payer: Self-pay

## 2021-08-22 NOTE — Chronic Care Management (AMB) (Signed)
Chronic Care Management Pharmacy Assistant   Name: Laura Rojas  MRN: [id number] DOB: 1946/03/16  Laura Rojas is an 75 y.o. year old female who presents for his follow-up CCM visit with the clinical pharmacist.  Reason for Encounter: Disease State   Recent office visits:  08/05/21 [name] NP PCP- pt seen for cough. Pt had Covid 19 07/19/21 No labs were ordered and no medication changes. Follow up in 3 months.  08/02/21 [name] NP - pt seen for Type 2 DM. Labs were ordered and pt was given Abuterol Sulfate 2.5 mg during visit and started Albuterol Sulfate 108, Azithromycin 250, and Ipratropium Albuterol 0.5-2.5 3 MG/ML. Follow up in 3 days.  07/29/21 [name] NP- pt was seen for HX of Covid-19. Chest Xray was done and pt started on Amox.Pot Clav[place]ante 875-125 MG, Hydrocod Polst-Chlorphen 10-8 mg , and Prednisone. Follow up that upcoming Tuesday.  07/20/21 Venita Lick NP (Video) - pt seen for close exposure to COVID 19. Pt started on Molnupiravir 800 mg BID, pt Atorvastatin and metformin changed in therapy. Pt advised to follow up in 10 days.    04/29/21 [name] NP- pt was seen for Type 2 . Labs were ordered and provider discontinued Benzonatate 200 mg. Follow up 3 months.  Recent consult visits:  05/16/21 Drema Halon Endo.- pt was seen for Type 2 Diabetes. Pt advised to Continue Metformin 500 mg twice daily - Increase Basaglar from 18 to 20 units each evening as ordered - Continue Novolog 20 units before meals. Patient is encouraged to use sliding scale before meals as needed. Follow up 3 months.  04/28/21 [facility]- pt was seen for low back pain. Unable to see documentation.  Hospital visits:  None in previous 6 months  Medications: Outpatient Encounter Medications as of 08/22/2021  Medication Sig Note   albuterol (VENTOLIN HFA) 108 (90 Base) MCG/ACT inhaler Inhale 2 puffs into the lungs every 6 (six) hours as needed for  wheezing or shortness of breath.    amLODipine (NORVASC) 2.5 MG tablet TAKE 1 TABLET BY MOUTH EVERY DAY    aspirin 81 MG tablet Take 81 mg by mouth daily.    atorvastatin (LIPITOR) 40 MG tablet Take 1 tablet (40 mg total) by mouth daily.    BD PEN NEEDLE NANO U/F 32G X 4 MM MISC SMARTSIG:1 Each SUB-Q 4 Times Daily    benzonatate (TESSALON) 200 MG capsule Take 1 capsule (200 mg total) by mouth 2 (two) times daily as needed for cough.    chlorpheniramine-HYDROcodone (TUSSIONEX PENNKINETIC ER) 10-8 MG/5ML SUER Take 5 mLs by mouth every 12 (twelve) hours as needed for cough.    cholecalciferol (VITAMIN D3) 25 MCG (1000 UT) tablet Take 1,000 Units by mouth daily.    clopidogrel (PLAVIX) 75 MG tablet Take 1 tablet (75 mg total) by mouth daily.    Continuous Blood Gluc Sensor (FREESTYLE LIBRE 14 DAY SENSOR) MISC Use 1 kit every 14 (fourteen) days S06.30    folic acid (FOLVITE) 1 MG tablet folic acid 1 mg tablet  TAKE 1 TABLET BY MOUTH EVERY DAY    Gabapentin, Once-Daily, 300 MG TABS Take 300 mg by mouth at bedtime.    hydrochlorothiazide (MICROZIDE) 12.5 MG capsule TAKE 1 CAPSULE BY MOUTH EVERY DAY    Insulin Glargine (BASAGLAR KWIKPEN) 100 UNIT/ML SOPN Inject 18 Units into the skin at bedtime.    Insulin Pen Needle (BD PEN NEEDLE NANO U/F) 32G X 4  MM MISC Use 1 each 4 (four) times daily    LORazepam (ATIVAN) 0.5 MG tablet TAKE 1 TAB BY MOUTH TWICE A DAY (USE THE ADDITIONAL 30 TABS ONLY SPARINGLY & ONLY FOR SEVERE ANXIETY)    losartan (COZAAR) 100 MG tablet TAKE 1 TABLET BY MOUTH EVERY DAY    Magnesium 400 MG TABS Take 1 tablet by mouth daily.    metFORMIN (GLUCOPHAGE) 1000 MG tablet Take 500 mg by mouth 2 (two) times daily.    Multiple Vitamin (MULTIVITAMIN) tablet Take 1 tablet by mouth daily.    neomycin-polymyxin b-dexamethasone (MAXITROL) 3.5-10000-0.1 SUSP Place 1 drop into the left eye 3 (three) times daily.    NOVOLOG 100 UNIT/ML injection Inject 10 Units into the skin 3 (three) times daily.  03/03/2021: 20 units + sliding scale Take 2 units of NovoLog if your sugar is 150-200 Take 4 units of NovoLog if your sugar is 201-250 Take 6 units of NovoLog if your sugar is 251-300 Take 8 units of NovoLog if your sugar is 301-350 Take 10 units of NovoLog if your sugar is 351-400 Take 12 units of NovoLog if your sugar is over 401      nystatin cream (MYCOSTATIN) Apply 1 application topically 2 (two) times daily.    Omega-3 Fatty Acids (FISH OIL) 1200 MG CAPS Take 1,200 mg by mouth daily.    pantoprazole (PROTONIX) 40 MG tablet pantoprazole 40 mg tablet,delayed release    sertraline (ZOLOFT) 100 MG tablet Take 1.5 tablets (150 mg total) by mouth daily.    tiZANidine (ZANAFLEX) 4 MG tablet     vitamin B-12 (CYANOCOBALAMIN) 1000 MCG tablet Take 1,000 mcg by mouth daily.    Facility-Administered Encounter Medications as of 08/22/2021  Medication   ipratropium-albuterol (DUONEB) 0.5-2.5 (3) MG/3ML nebulizer solution 3 mL   Recent Relevant Labs: Lab Results  Component Value Date/Time   HGBA1C 8.2 (H) 08/02/2021 01:44 PM   HGBA1C 8.2 (H) 04/29/2021 02:48 PM   HGBA1C 8.2 12/31/2019 12:00 AM   HGBA1C 8.8% 09/24/2019 12:00 AM   MICROALBUR 30 (H) 01/11/2021 09:18 AM   MICROALBUR 30 (H) 03/17/2020 10:39 AM    Kidney Function Lab Results  Component Value Date/Time   CREATININE 1.12 (H) 08/02/2021 01:48 PM   CREATININE 0.99 01/11/2021 09:20 AM   GFRNONAA 56 (L) 01/11/2021 09:20 AM   GFRAA 64 01/11/2021 09:20 AM    Current antihyperglycemic regimen:  Metformin 500 mg bid           Basaglar 20 unit qpm Novolog 18 units with meals + SS  What recent interventions/DTPs have been made to improve glycemic control:  None  Have there been any recent hospitalizations or ED visits since last visit with CPP? No  Patient reports hypoglycemic symptoms, including Vision changes  Patient reports hyperglycemic symptoms, including excessive thirst  How often are you checking your blood sugar?    Pt stated she checks her sugars every hour. Pt levels are fluctuating even with medication and she was unable to give an average glucose reading. During the call she stated her levels were in her normal range.  What are your blood sugars ranging?  Fasting:  Before meals:  After meals:  Bedtime:   During the week, how often does your blood glucose drop below 70?   Pt stated her levels are noticeably low at night time but she was unable to give me a number.  Are you checking your feet daily/regularly? Pt stated she checks her feet daily and sees  a podiatrist as well.  Adherence Review: Is the patient currently on a STATIN medication? Yes Is the patient currently on ACE/ARB medication? No Does the patient have >5 day gap between last estimated fill dates? No    Star Rating Drugs: losartan (COZAAR) 100 MG tablet last filled 06/22/21 90 DS  [place] Clinical Pharmacist Assistant [phone]

## 2021-08-23 ENCOUNTER — Other Ambulatory Visit: Payer: Self-pay | Admitting: Cardiovascular Disease

## 2021-08-25 DIAGNOSIS — M0609 Rheumatoid arthritis without rheumatoid factor, multiple sites: Secondary | ICD-10-CM | POA: Diagnosis not present

## 2021-08-27 ENCOUNTER — Other Ambulatory Visit: Payer: Self-pay | Admitting: Nurse Practitioner

## 2021-08-29 ENCOUNTER — Ambulatory Visit (INDEPENDENT_AMBULATORY_CARE_PROVIDER_SITE_OTHER): Payer: Medicare Other | Admitting: Dermatology

## 2021-08-29 ENCOUNTER — Other Ambulatory Visit: Payer: Self-pay

## 2021-08-29 DIAGNOSIS — D1800 Hemangioma unspecified site: Secondary | ICD-10-CM

## 2021-08-29 DIAGNOSIS — D229 Melanocytic nevi, unspecified: Secondary | ICD-10-CM

## 2021-08-29 DIAGNOSIS — L578 Other skin changes due to chronic exposure to nonionizing radiation: Secondary | ICD-10-CM | POA: Diagnosis not present

## 2021-08-29 DIAGNOSIS — T07XXXA Unspecified multiple injuries, initial encounter: Secondary | ICD-10-CM

## 2021-08-29 DIAGNOSIS — Z872 Personal history of diseases of the skin and subcutaneous tissue: Secondary | ICD-10-CM

## 2021-08-29 DIAGNOSIS — L821 Other seborrheic keratosis: Secondary | ICD-10-CM

## 2021-08-29 DIAGNOSIS — Z1283 Encounter for screening for malignant neoplasm of skin: Secondary | ICD-10-CM | POA: Diagnosis not present

## 2021-08-29 DIAGNOSIS — I25708 Atherosclerosis of coronary artery bypass graft(s), unspecified, with other forms of angina pectoris: Secondary | ICD-10-CM | POA: Diagnosis not present

## 2021-08-29 DIAGNOSIS — L814 Other melanin hyperpigmentation: Secondary | ICD-10-CM | POA: Diagnosis not present

## 2021-08-29 DIAGNOSIS — L820 Inflamed seborrheic keratosis: Secondary | ICD-10-CM

## 2021-08-29 NOTE — Patient Instructions (Signed)

## 2021-08-29 NOTE — Progress Notes (Signed)
   Follow-Up Visit   Subjective  Laura Rojas is a 75 y.o. female who presents for the following: Annual Exam (Hx of rash under the breast that is red and itchy ). The patient presents for Total-Body Skin Exam (TBSE) for skin cancer screening and mole check.  The following portions of the chart were reviewed this encounter and updated as appropriate:   Tobacco  Allergies  Meds  Problems  Med Hx  Surg Hx  Fam Hx     Review of Systems:  No other skin or systemic complaints except as noted in HPI or Assessment and Plan.  Objective  Well appearing patient in no apparent distress; mood and affect are within normal limits.  A full examination was performed including scalp, head, eyes, ears, nose, lips, neck, chest, axillae, abdomen, back, buttocks, bilateral upper extremities, bilateral lower extremities, hands, feet, fingers, toes, fingernails, and toenails. All findings within normal limits unless otherwise noted below.  Back Excoriations.  L Inframammary x 11, L forehead x 1, R infra mammary x 9 (21) Erythematous keratotic or waxy stuck-on papule or plaque.    Assessment & Plan  Multiple excoriations Back Benign appearing, no tx needed. Pt scratching, but declines treatment.  Inflamed seborrheic keratosis L Inframammary x 11, L forehead x 1, R infra mammary x 9  Destruction of lesion - L Inframammary x 11, L forehead x 1, R infra mammary x 9 Complexity: simple   Destruction method: cryotherapy   Informed consent: discussed and consent obtained   Timeout:  patient name, date of birth, surgical site, and procedure verified Lesion destroyed using liquid nitrogen: Yes   Region frozen until ice ball extended beyond lesion: Yes   Outcome: patient tolerated procedure well with no complications   Post-procedure details: wound care instructions given    Skin cancer screening  Lentigines - Scattered tan macules - Due to sun exposure - Benign-appearing, observe - Recommend  daily broad spectrum sunscreen SPF 30+ to sun-exposed areas, reapply every 2 hours as needed. - Call for any changes  Seborrheic Keratoses - Stuck-on, waxy, tan-brown papules and/or plaques  - Benign-appearing - Discussed benign etiology and prognosis. - Observe - Call for any changes  Melanocytic Nevi - Tan-brown and/or pink-flesh-colored symmetric macules and papules - Benign appearing on exam today - Observation - Call clinic for new or changing moles - Recommend daily use of broad spectrum spf 30+ sunscreen to sun-exposed areas.   Hemangiomas - Red papules - Discussed benign nature - Observe - Call for any changes  Actinic Damage - Chronic condition, secondary to cumulative UV/sun exposure - diffuse scaly erythematous macules with underlying dyspigmentation - Recommend daily broad spectrum sunscreen SPF 30+ to sun-exposed areas, reapply every 2 hours as needed.  - Staying in the shade or wearing long sleeves, sun glasses (UVA+UVB protection) and wide brim hats (4-inch brim around the entire circumference of the hat) are also recommended for sun protection.  - Call for new or changing lesions.  Skin cancer screening performed today.  Return in about 1 year (around 08/29/2022) for TBSE.  Luther Redo, CMA, am acting as scribe for Sarina Ser, MD . Documentation: I have reviewed the above documentation for accuracy and completeness, and I agree with the above.  Sarina Ser, MD

## 2021-08-30 ENCOUNTER — Encounter: Payer: Self-pay | Admitting: Dermatology

## 2021-08-30 DIAGNOSIS — M0609 Rheumatoid arthritis without rheumatoid factor, multiple sites: Secondary | ICD-10-CM | POA: Diagnosis not present

## 2021-08-30 DIAGNOSIS — Z79899 Other long term (current) drug therapy: Secondary | ICD-10-CM | POA: Diagnosis not present

## 2021-08-30 DIAGNOSIS — M159 Polyosteoarthritis, unspecified: Secondary | ICD-10-CM | POA: Diagnosis not present

## 2021-08-31 ENCOUNTER — Ambulatory Visit: Payer: Medicare Other

## 2021-09-02 ENCOUNTER — Ambulatory Visit (INDEPENDENT_AMBULATORY_CARE_PROVIDER_SITE_OTHER): Payer: Medicare Other

## 2021-09-02 VITALS — Ht 65.0 in | Wt 165.0 lb

## 2021-09-02 DIAGNOSIS — Z0000 Encounter for general adult medical examination without abnormal findings: Secondary | ICD-10-CM | POA: Diagnosis not present

## 2021-09-02 NOTE — Progress Notes (Signed)
I connected with [name] today by telephone and verified that I am speaking with the correct person using two identifiers. Location patient: home Location provider: work Persons participating in the virtual visit: Almira Phetteplace, Glenna Durand LPN.   I discussed the limitations, risks, security and privacy concerns of performing an evaluation and management service by telephone and the availability of in person appointments. I also discussed with the patient that there may be a patient responsible charge related to this service. The patient expressed understanding and verbally consented to this telephonic visit.    Interactive audio and video telecommunications were attempted between this provider and patient, however failed, due to patient having technical difficulties OR patient did not have access to video capability.  We continued and completed visit with audio only.     Vital signs may be patient reported or missing.  Subjective:   INFINITY JEFFORDS is a 75 y.o. female who presents for Medicare Annual (Subsequent) preventive examination.  Review of Systems     Cardiac Risk Factors include: advanced age (>75mn, >>69women);diabetes mellitus;dyslipidemia;hypertension;sedentary lifestyle     Objective:    Today's Vitals   09/02/21 0811  Weight: 165 lb (74.8 kg)  Height: 5' 5"  (1.651 m)  PainSc: 7    Body mass index is 27.46 kg/m.  Advanced Directives 09/02/2021 08/30/2020 05/04/2020 04/13/2020 08/27/2019 08/19/2019 08/21/2018  Does Patient Have a Medical Advance Directive? No No No No No No No  Would patient like information on creating a medical advance directive? - - No - Patient declined Yes (MAU/Ambulatory/Procedural Areas - Information given) - - Yes (MAU/Ambulatory/Procedural Areas - Information given)    Current Medications (verified) Outpatient Encounter Medications as of 09/02/2021  Medication Sig   albuterol (VENTOLIN HFA) 108 (90 Base) MCG/ACT inhaler TAKE 2 PUFFS BY  MOUTH EVERY 6 HOURS AS NEEDED FOR WHEEZE OR SHORTNESS OF BREATH   amLODipine (NORVASC) 2.5 MG tablet TAKE 1 TABLET BY MOUTH EVERY DAY   aspirin 81 MG tablet Take 81 mg by mouth daily.   BD PEN NEEDLE NANO U/F 32G X 4 MM MISC SMARTSIG:1 Each SUB-Q 4 Times Daily   benzonatate (TESSALON) 200 MG capsule Take 1 capsule (200 mg total) by mouth 2 (two) times daily as needed for cough.   chlorpheniramine-HYDROcodone (TUSSIONEX PENNKINETIC ER) 10-8 MG/5ML SUER Take 5 mLs by mouth every 12 (twelve) hours as needed for cough.   cholecalciferol (VITAMIN D3) 25 MCG (1000 UT) tablet Take 1,000 Units by mouth daily.   clopidogrel (PLAVIX) 75 MG tablet Take 1 tablet (75 mg total) by mouth daily.   Continuous Blood Gluc Sensor (FREESTYLE LIBRE 14 DAY SENSOR) MISC Use 1 kit every 14 (fourteen) days EH06.23  folic acid (FOLVITE) 1 MG tablet folic acid 1 mg tablet  TAKE 1 TABLET BY MOUTH EVERY DAY   Gabapentin, Once-Daily, 300 MG TABS Take 300 mg by mouth at bedtime.   hydrochlorothiazide (MICROZIDE) 12.5 MG capsule TAKE 1 CAPSULE BY MOUTH EVERY DAY   Insulin Glargine (BASAGLAR KWIKPEN) 100 UNIT/ML SOPN Inject 18 Units into the skin at bedtime.   Insulin Pen Needle (BD PEN NEEDLE NANO U/F) 32G X 4 MM MISC Use 1 each 4 (four) times daily   LORazepam (ATIVAN) 0.5 MG tablet TAKE 1 TAB BY MOUTH TWICE A DAY (USE THE ADDITIONAL 30 TABS ONLY SPARINGLY & ONLY FOR SEVERE ANXIETY)   losartan (COZAAR) 100 MG tablet TAKE 1 TABLET BY MOUTH EVERY DAY   Magnesium 400 MG TABS Take 1  tablet by mouth daily.   metFORMIN (GLUCOPHAGE) 1000 MG tablet Take 500 mg by mouth 2 (two) times daily.   Multiple Vitamin (MULTIVITAMIN) tablet Take 1 tablet by mouth daily.   NOVOLOG 100 UNIT/ML injection Inject 10 Units into the skin 3 (three) times daily.   nystatin cream (MYCOSTATIN) Apply 1 application topically 2 (two) times daily.   Omega-3 Fatty Acids (FISH OIL) 1200 MG CAPS Take 1,200 mg by mouth daily.   pantoprazole (PROTONIX) 40 MG  tablet pantoprazole 40 mg tablet,delayed release   sertraline (ZOLOFT) 100 MG tablet Take 1.5 tablets (150 mg total) by mouth daily.   vitamin B-12 (CYANOCOBALAMIN) 1000 MCG tablet Take 1,000 mcg by mouth daily.   atorvastatin (LIPITOR) 40 MG tablet Take 1 tablet (40 mg total) by mouth daily.   neomycin-polymyxin b-dexamethasone (MAXITROL) 3.5-10000-0.1 SUSP Place 1 drop into the left eye 3 (three) times daily. (Patient not taking: Reported on 09/02/2021)   tiZANidine (ZANAFLEX) 4 MG tablet  (Patient not taking: Reported on 09/02/2021)   Facility-Administered Encounter Medications as of 09/02/2021  Medication   ipratropium-albuterol (DUONEB) 0.5-2.5 (3) MG/3ML nebulizer solution 3 mL    Allergies (verified) Compazine [prochlorperazine edisylate], Altace [ramipril], Crestor [rosuvastatin calcium], Reglan [metoclopramide], Sulfa antibiotics, Actos [pioglitazone], Amlodipine, Latex, and Lipitor [atorvastatin]   History: Past Medical History:  Diagnosis Date   Arthritis    Shoulders, hands, feet   CAD (coronary artery disease) 12/31/2013   2007, 3.0 x 28 mm drug-eluting Cypher stent to RCA, 3.5 x 33 mm drug-eluting Cypher stent and proximal LAD) Consent by 2009 Normal perfusion by mildly 2013    Diabetes mellitus without complication (HCC)    Dyslipidemia    GERD (gastroesophageal reflux disease)    Hypertension    Polymyalgia rheumatica syndrome ([place])    Subclavian artery stenosis, left ([place]) 01/01/2015   Past Surgical History:  Procedure Laterality Date   BREAST EXCISIONAL BIOPSY Right 1970   EXCISIONAL - NEG   BREAST EXCISIONAL BIOPSY Left 1973   EXCISIONAL - NEG   BREAST SURGERY     CARDIAC CATHETERIZATION  08/25/2008   patent stents   CAROTID DUPLEX  10/2006   NORMAL CAROTID ARTERY DUPLEX   CATARACT EXTRACTION W/PHACO Left 04/13/2020   Procedure: CATARACT EXTRACTION PHACO AND INTRAOCULAR LENS PLACEMENT (IOC) LEFT DIABETIC 4.69  00:44.0;  Surgeon: Birder Robson, MD;  Location:  [facility];  Service: Ophthalmology;  Laterality: Left;  Diabetic - insulin and oral meds   CATARACT EXTRACTION W/PHACO Right 05/04/2020   Procedure: CATARACT EXTRACTION PHACO AND INTRAOCULAR LENS PLACEMENT (IOC) RIGHT DIABETIC 3.92  00:27.1;  Surgeon: Birder Robson, MD;  Location: [facility];  Service: Ophthalmology;  Laterality: Right;  Diabetic - insulin and oral meds   COLONOSCOPY WITH PROPOFOL N/A 08/19/2019   Procedure: COLONOSCOPY WITH PROPOFOL;  Surgeon: Lucilla Lame, MD;  Location: [facility] ENDOSCOPY;  Service: Endoscopy;  Laterality: N/A;   CORONARY ANGIOPLASTY WITH STENT PLACEMENT  09/17/2006   PTCA & stenting LAD   ESOPHAGOGASTRODUODENOSCOPY N/A 04/27/2015   Procedure: ESOPHAGOGASTRODUODENOSCOPY (EGD);  Surgeon: Lucilla Lame, MD;  Location: [facility] ENDOSCOPY;  Service: Endoscopy;  Laterality: N/A;   JOINT REPLACEMENT     NM MYOCAR MULTIPLE W/SPECT  11/2011   EF 72%.NORMAL MYOCARDIAL PERFUSION STUDY   REPLACEMENT TOTAL KNEE  2003 & 2004   TRANSTHORACIC ECHOCARDIOGRAM  09/2006   EF =>55%. IMPAIRED LV RELAXATION. MILD MITRAL ANNULAR CALCIFICATION. AV-MILDLY SCLEROTIC. NO AS.   VAGINAL HYSTERECTOMY  1983   Family History  Problem Relation Age  of Onset   Breast cancer Mother 69   Diabetes Sister    Heart disease Maternal Grandmother    Heart disease Maternal Grandfather    Brain cancer Grandchild    Social History   Socioeconomic History   Marital status: Married    Spouse name: Not on file   Number of children: Not on file   Years of education: Not on file   Highest education level: Some college, no degree  Occupational History   Not on file  Tobacco Use   Smoking status: Never   Smokeless tobacco: Never  Vaping Use   Vaping Use: Never used  Substance and Sexual Activity   Alcohol use: No    Alcohol/week: 0.0 standard drinks   Drug use: Never   Sexual activity: Not on file  Other Topics Concern   Not on file  Social History Narrative   Not on file    Social Determinants of Health   Financial Resource Strain: Low Risk    Difficulty of Paying Living Expenses: Not hard at all  Food Insecurity: No Food Insecurity   Worried About Charity fundraiser in the Last Year: Never true   [place] in the Last Year: Never true  Transportation Needs: No Transportation Needs   Lack of Transportation (Medical): No   Lack of Transportation (Non-Medical): No  Physical Activity: Inactive   Days of Exercise per Week: 0 days   Minutes of Exercise per Session: 0 min  Stress: No Stress Concern Present   Feeling of Stress : Not at all  Social Connections: Not on file    Tobacco Counseling Counseling given: Not Answered   Clinical Intake:  Pre-visit preparation completed: Yes  Pain : 0-10 Pain Score: 7  Pain Type: Chronic pain Pain Location: Hip Pain Orientation: Right Pain Radiating Towards: leg Pain Descriptors / Indicators: Shooting Pain Onset: More than a month ago Pain Frequency: Intermittent     Nutritional Status: BMI 25 -29 Overweight Nutritional Risks: None Diabetes: Yes  How often do you need to have someone help you when you read instructions, pamphlets, or other written materials from your doctor or pharmacy?: 1 - Never What is the last grade level you completed in school?: 60yrcollege  Diabetic? Yes Nutrition Risk Assessment:  Has the patient had any N/V/D within the last 2 months?  No  Does the patient have any non-healing wounds?  No  Has the patient had any unintentional weight loss or weight gain?  No   Diabetes:  Is the patient diabetic?  Yes  If diabetic, was a CBG obtained today?  No  Did the patient bring in their glucometer from home?  No  How often do you monitor your CBG's? 4 times daily.   Financial Strains and Diabetes Management:  Are you having any financial strains with the device, your supplies or your medication? No .  Does the patient want to be seen by Chronic Care Management for  management of their diabetes?  No  Would the patient like to be referred to a Nutritionist or for Diabetic Management?  No   Diabetic Exams:  Diabetic Eye Exam: Completed 12/01/2020 Diabetic Foot Exam: Completed 01/11/2021   Interpreter Needed?: No  Information entered by :: NAllen LPN   Activities of Daily Living In your present state of health, do you have any difficulty performing the following activities: 09/02/2021  Hearing? N  Vision? N  Difficulty concentrating or making decisions? N  Walking or  climbing stairs? N  Dressing or bathing? N  Doing errands, shopping? N  Preparing Food and eating ? N  Using the Toilet? N  In the past six months, have you accidently leaked urine? Y  Do you have problems with loss of bowel control? N  Managing your Medications? N  Managing your Finances? N  Housekeeping or managing your Housekeeping? N  Some recent data might be hidden    Patient Care Team: Venita Lick, NP as PCP - General (Nurse Practitioner) Sanda Klein, MD as PCP - Cardiology (Cardiology) Marlowe Sax, MD as Referring Physician (Internal Medicine) Warnell Forester, NP (Inactive) as Nurse Practitioner (Surgery)  Indicate any recent Medical Services you may have received from other than [facility] providers in the past year (date may be approximate).     Assessment:   This is a routine wellness examination for Berlene.  Hearing/Vision screen Vision Screening - Comments:: Regular eye exams, [facility]  Dietary issues and exercise activities discussed: Current Exercise Habits: The patient does not participate in regular exercise at present   Goals Addressed             This Visit's Progress    Patient Stated       09/02/2021, no goals       Depression Screen PHQ 2/9 Scores 09/02/2021 08/02/2021 04/29/2021 04/29/2021 01/19/2021 10/13/2020 08/30/2020  PHQ - 2 Score 0 0 0 0 0 1 0  PHQ- 9 Score - 2 7 7 5 3  -    Fall Risk Fall Risk  09/02/2021  08/30/2020 12/17/2019 10/14/2019 08/27/2019  Falls in the past year? 0 0 0 1 1  Number falls in past yr: - - 0 0 0  Injury with Fall? - - 0 0 0  Risk for fall due to : Medication side effect Medication side effect - - -  Follow up Falls evaluation completed;Education provided;Falls prevention discussed Falls evaluation completed;Education provided;Falls prevention discussed - - -    FALL RISK PREVENTION PERTAINING TO THE HOME:  Any stairs in or around the home? Yes  If so, are there any without handrails? No  Home free of loose throw rugs in walkways, pet beds, electrical cords, etc? Yes  Adequate lighting in your home to reduce risk of falls? Yes   ASSISTIVE DEVICES UTILIZED TO PREVENT FALLS:  Life alert? No  Use of a cane, walker or w/c? No  Grab bars in the bathroom? Yes  Shower chair or bench in shower? Yes  Elevated toilet seat or a handicapped toilet? Yes   TIMED UP AND GO:  Was the test performed? No .      Cognitive Function:     6CIT Screen 09/02/2021 08/30/2020 12/17/2019 08/21/2018 07/18/2017  What Year? 0 points 0 points 0 points 0 points 0 points  What month? 0 points 0 points 0 points 0 points 0 points  What time? 0 points 0 points 0 points 0 points 0 points  Count back from 20 0 points 0 points 0 points 0 points 0 points  Months in reverse 0 points 0 points 0 points 0 points 0 points  Repeat phrase 0 points 0 points 0 points 2 points 0 points  Total Score 0 0 0 2 0    Immunizations Immunization History  Administered Date(s) Administered   Influenza, High Dose Seasonal PF 09/04/2016, 08/23/2017, 08/28/2018, 07/14/2019, 09/03/2020   Influenza,inj,Quad PF,6+ Mos 09/27/2015   Influenza-Unspecified 09/18/2014   PFIZER(Purple Top)SARS-COV-2 Vaccination 12/04/2019, 12/25/2019, 09/01/2020   Pneumococcal Conjugate-13  04/22/2014   Pneumococcal Polysaccharide-23 11/12/2008, 07/05/2016   Td 12/21/2009, 03/17/2020   Zoster Recombinat (Shingrix) 07/14/2019, 10/15/2019     TDAP status: Up to date  Flu Vaccine status: Due, Education has been provided regarding the importance of this vaccine. Advised may receive this vaccine at local pharmacy or Health Dept. Aware to provide a copy of the vaccination record if obtained from local pharmacy or Health Dept. Verbalized acceptance and understanding.  Pneumococcal vaccine status: Up to date  Covid-19 vaccine status: Completed vaccines  Qualifies for Shingles Vaccine? Yes   Zostavax completed No   Shingrix Completed?: Yes  Screening Tests Health Maintenance  Topic Date Due   COVID-19 Vaccine (4 - Booster for Pfizer series) 11/24/2020   INFLUENZA VACCINE  06/27/2021   OPHTHALMOLOGY EXAM  12/01/2021   FOOT EXAM  01/11/2022   HEMOGLOBIN A1C  01/30/2022   COLONOSCOPY (Pts 45-55yr Insurance coverage will need to be confirmed)  08/18/2024   TETANUS/TDAP  03/17/2030   DEXA SCAN  Completed   Hepatitis C Screening  Completed   Zoster Vaccines- Shingrix  Completed   HPV VACCINES  Aged Out    Health Maintenance  Health Maintenance Due  Topic Date Due   COVID-19 Vaccine (4 - Booster for Pfizer series) 11/24/2020   INFLUENZA VACCINE  06/27/2021    Colorectal cancer screening: No longer required.   Mammogram status: patient to schedule  Bone Density status: Completed 05/13/2014.   Lung Cancer Screening: (Low Dose CT Chest recommended if Age 76-80years, 30 pack-year currently smoking OR have quit w/in 15years.) does not qualify.   Lung Cancer Screening Referral: no  Additional Screening:  Hepatitis C Screening: does qualify; Completed 07/05/2016  Vision Screening: Recommended annual ophthalmology exams for early detection of glaucoma and other disorders of the eye. Is the patient up to date with their annual eye exam?  Yes  Who is the provider or what is the name of the office in which the patient attends annual eye exams[facility] a provider, would they like  to be referred to a provider to establish care? No .   Dental Screening: Recommended annual dental exams for proper oral hygiene  Community Resource Referral / Chronic Care Management: CRR required this visit?  No   CCM required this visit?  No      Plan:     I have personally reviewed and noted the following in the patient's chart:   Medical and social history Use of alcohol, tobacco or illicit drugs  Current medications and supplements including opioid prescriptions.  Functional ability and status Nutritional status Physical activity Advanced directives List of other physicians Hospitalizations, surgeries, and ER visits in previous 12 months Vitals Screenings to include cognitive, depression, and falls Referrals and appointments  In addition, I have reviewed and discussed with patient certain preventive protocols, quality metrics, and best practice recommendations. A written personalized care plan for preventive services as well as general preventive health recommendations were provided to patient.     NKellie Simmering LPN   [id number]  Nurse Notes:

## 2021-09-02 NOTE — Patient Instructions (Signed)
Laura Rojas , Thank you for taking time to come for your Medicare Wellness Visit. I appreciate your ongoing commitment to your health goals. Please review the following plan we discussed and let me know if I can assist you in the future.   Screening recommendations/referrals: Colonoscopy: not required Mammogram: patient to schedule Bone Density: completed 05/13/2014 Recommended yearly ophthalmology/optometry visit for glaucoma screening and checkup Recommended yearly dental visit for hygiene and checkup  Vaccinations: Influenza vaccine: due Pneumococcal vaccine: completed 07/05/2016 Tdap vaccine: completed 03/17/2020, due 03/17/2030 Shingles vaccine: completed   Covid-19: 09/01/2020, 12/25/2019, 12/04/2019  Advanced directives: Advance directive discussed with you today.   Conditions/risks identified: none  Next appointment: Follow up in one year for your annual wellness visit    Preventive Care 65 Years and Older, Female Preventive care refers to lifestyle choices and visits with your health care provider that can promote health and wellness. What does preventive care include? A yearly physical exam. This is also called an annual well check. Dental exams once or twice a year. Routine eye exams. Ask your health care provider how often you should have your eyes checked. Personal lifestyle choices, including: Daily care of your teeth and gums. Regular physical activity. Eating a healthy diet. Avoiding tobacco and drug use. Limiting alcohol use. Practicing safe sex. Taking low-dose aspirin every day. Taking vitamin and mineral supplements as recommended by your health care provider. What happens during an annual well check? The services and screenings done by your health care provider during your annual well check will depend on your age, overall health, lifestyle risk factors, and family history of disease. Counseling  Your health care provider may ask you questions about your: Alcohol  use. Tobacco use. Drug use. Emotional well-being. Home and relationship well-being. Sexual activity. Eating habits. History of falls. Memory and ability to understand (cognition). Work and work Statistician. Reproductive health. Screening  You may have the following tests or measurements: Height, weight, and BMI. Blood pressure. Lipid and cholesterol levels. These may be checked every 5 years, or more frequently if you are over 38 years old. Skin check. Lung cancer screening. You may have this screening every year starting at age 48 if you have a 30-pack-year history of smoking and currently smoke or have quit within the past 15 years. Fecal occult blood test (FOBT) of the stool. You may have this test every year starting at age 71. Flexible sigmoidoscopy or colonoscopy. You may have a sigmoidoscopy every 5 years or a colonoscopy every 10 years starting at age 54. Hepatitis C blood test. Hepatitis B blood test. Sexually transmitted disease (STD) testing. Diabetes screening. This is done by checking your blood sugar (glucose) after you have not eaten for a while (fasting). You may have this done every 1-3 years. Bone density scan. This is done to screen for osteoporosis. You may have this done starting at age 72. Mammogram. This may be done every 1-2 years. Talk to your health care provider about how often you should have regular mammograms. Talk with your health care provider about your test results, treatment options, and if necessary, the need for more tests. Vaccines  Your health care provider may recommend certain vaccines, such as: Influenza vaccine. This is recommended every year. Tetanus, diphtheria, and acellular pertussis (Tdap, Td) vaccine. You may need a Td booster every 10 years. Zoster vaccine. You may need this after age 83. Pneumococcal 13-valent conjugate (PCV13) vaccine. One dose is recommended after age 85. Pneumococcal polysaccharide (PPSV23) vaccine. One dose  is  recommended after age 63. Talk to your health care provider about which screenings and vaccines you need and how often you need them. This information is not intended to replace advice given to you by your health care provider. Make sure you discuss any questions you have with your health care provider. Document Released: 12/10/2015 Document Revised: 08/02/2016 Document Reviewed: 09/14/2015 Elsevier Interactive Patient Education  2017 [place] Prevention in the Home Falls can cause injuries. They can happen to people of all ages. There are many things you can do to make your home safe and to help prevent falls. What can I do on the outside of my home? Regularly fix the edges of walkways and driveways and fix any cracks. Remove anything that might make you trip as you walk through a door, such as a raised step or threshold. Trim any bushes or trees on the path to your home. Use bright outdoor lighting. Clear any walking paths of anything that might make someone trip, such as rocks or tools. Regularly check to see if handrails are loose or broken. Make sure that both sides of any steps have handrails. Any raised decks and porches should have guardrails on the edges. Have any leaves, snow, or ice cleared regularly. Use sand or salt on walking paths during winter. Clean up any spills in your garage right away. This includes oil or grease spills. What can I do in the bathroom? Use night lights. Install grab bars by the toilet and in the tub and shower. Do not use towel bars as grab bars. Use non-skid mats or decals in the tub or shower. If you need to sit down in the shower, use a plastic, non-slip stool. Keep the floor dry. Clean up any water that spills on the floor as soon as it happens. Remove soap buildup in the tub or shower regularly. Attach bath mats securely with double-sided non-slip rug tape. Do not have throw rugs and other things on the floor that can make you  trip. What can I do in the bedroom? Use night lights. Make sure that you have a light by your bed that is easy to reach. Do not use any sheets or blankets that are too big for your bed. They should not hang down onto the floor. Have a firm chair that has side arms. You can use this for support while you get dressed. Do not have throw rugs and other things on the floor that can make you trip. What can I do in the kitchen? Clean up any spills right away. Avoid walking on wet floors. Keep items that you use a lot in easy-to-reach places. If you need to reach something above you, use a strong step stool that has a grab bar. Keep electrical cords out of the way. Do not use floor polish or wax that makes floors slippery. If you must use wax, use non-skid floor wax. Do not have throw rugs and other things on the floor that can make you trip. What can I do with my stairs? Do not leave any items on the stairs. Make sure that there are handrails on both sides of the stairs and use them. Fix handrails that are broken or loose. Make sure that handrails are as long as the stairways. Check any carpeting to make sure that it is firmly attached to the stairs. Fix any carpet that is loose or worn. Avoid having throw rugs at the top or bottom of the stairs.  If you do have throw rugs, attach them to the floor with carpet tape. Make sure that you have a light switch at the top of the stairs and the bottom of the stairs. If you do not have them, ask someone to add them for you. What else can I do to help prevent falls? Wear shoes that: Do not have high heels. Have rubber bottoms. Are comfortable and fit you well. Are closed at the toe. Do not wear sandals. If you use a stepladder: Make sure that it is fully opened. Do not climb a closed stepladder. Make sure that both sides of the stepladder are locked into place. Ask someone to hold it for you, if possible. Clearly mark and make sure that you can  see: Any grab bars or handrails. First and last steps. Where the edge of each step is. Use tools that help you move around (mobility aids) if they are needed. These include: Canes. Walkers. Scooters. Crutches. Turn on the lights when you go into a dark area. Replace any light bulbs as soon as they burn out. Set up your furniture so you have a clear path. Avoid moving your furniture around. If any of your floors are uneven, fix them. If there are any pets around you, be aware of where they are. Review your medicines with your doctor. Some medicines can make you feel dizzy. This can increase your chance of falling. Ask your doctor what other things that you can do to help prevent falls. This information is not intended to replace advice given to you by your health care provider. Make sure you discuss any questions you have with your health care provider. Document Released: 09/09/2009 Document Revised: 04/20/2016 Document Reviewed: 12/18/2014 Elsevier Interactive Patient Education  2017 [employer].

## 2021-09-05 ENCOUNTER — Telehealth: Payer: Medicare Other

## 2021-09-05 DIAGNOSIS — M47817 Spondylosis without myelopathy or radiculopathy, lumbosacral region: Secondary | ICD-10-CM | POA: Diagnosis not present

## 2021-09-14 ENCOUNTER — Other Ambulatory Visit: Payer: Self-pay | Admitting: Nurse Practitioner

## 2021-09-14 ENCOUNTER — Ambulatory Visit (INDEPENDENT_AMBULATORY_CARE_PROVIDER_SITE_OTHER): Payer: Medicare Other

## 2021-09-14 ENCOUNTER — Other Ambulatory Visit: Payer: Self-pay

## 2021-09-14 DIAGNOSIS — Z1231 Encounter for screening mammogram for malignant neoplasm of breast: Secondary | ICD-10-CM

## 2021-09-14 DIAGNOSIS — Z23 Encounter for immunization: Secondary | ICD-10-CM | POA: Diagnosis not present

## 2021-09-14 NOTE — Progress Notes (Signed)
Patient presents today for High dose flu vaccinations, received in right deltoid, patient tolerated well.

## 2021-09-18 ENCOUNTER — Other Ambulatory Visit: Payer: Self-pay | Admitting: Nurse Practitioner

## 2021-09-18 ENCOUNTER — Other Ambulatory Visit: Payer: Self-pay | Admitting: Cardiovascular Disease

## 2021-09-19 NOTE — Telephone Encounter (Signed)
Requested medication (s) are due for refill today: Yes  Requested medication (s) are on the active medication list: Yes  Last refill:  8//24/22  Future visit scheduled: Yes  Notes to clinic:  Historical provider.    Requested Prescriptions  Pending Prescriptions Disp Refills   pantoprazole (PROTONIX) 40 MG tablet [Pharmacy Med Name: PANTOPRAZOLE SOD DR 40 MG TAB] 90 tablet 4    Sig: TAKE 1 TABLET BY MOUTH EVERY DAY     Gastroenterology: Proton Pump Inhibitors Passed - 09/18/2021  9:26 PM      Passed - Valid encounter within last 12 months    Recent Outpatient Visits           1 month ago [place], Jolene T, NP   1 month ago Type 2 diabetes mellitus with hyperglycemia, with long-term current use of insulin ([place])   [place], [place] T, NP   1 month ago History of 2019 novel coronavirus disease (COVID-19)   [place], [place] T, NP   2 months ago Close exposure to COVID-19 virus   [employer], [place] T, NP   4 months ago Type 2 diabetes mellitus with hyperglycemia, with long-term current use of insulin ([place])   [place], Barbaraann Faster, NP       Future Appointments             In 1 month [place], [place], MD [place], [place]   In 1 month [place], Barbaraann Faster, NP [facility]   In 11 months Ralene Bathe, MD [place]   In 11 months  [facility]

## 2021-09-22 DIAGNOSIS — M0609 Rheumatoid arthritis without rheumatoid factor, multiple sites: Secondary | ICD-10-CM | POA: Diagnosis not present

## 2021-09-28 ENCOUNTER — Telehealth: Payer: Self-pay

## 2021-09-28 NOTE — Telephone Encounter (Signed)
Left VM

## 2021-09-28 NOTE — Telephone Encounter (Signed)
   [facility] Pre-operative Risk Assessment    Patient Name: Laura Rojas  DOB: February 17, 1946 MRN: [id number]  HEARTCARE STAFF:  - IMPORTANT!!!!!! Under Visit Info/Reason for Call, type in Other and utilize the format Clearance MM/DD/YY or Clearance TBD. Do not use dashes or single digits. - Please review there is not already an duplicate clearance open for this procedure. - If request is for dental extraction, please clarify the # of teeth to be extracted. - If the patient is currently at the dentist's office, call Pre-Op Callback Staff (MA/nurse) to input urgent request.  - If the patient is not currently in the dentist office, please route to the Pre-Op pool.  Request for surgical clearance:  What type of surgery is being performed? Lumbar epidural steroid injection  When is this surgery scheduled? TBD  What type of clearance is required (medical clearance vs. Pharmacy clearance to hold med vs. Both)? Both   Are there any medications that need to be held prior to surgery and how long? ASA hold 6 days prior to procedure and hold Plavix 7 days prior to procedure   Practice name and name of physician performing surgery? [facility]   What is the office phone number? [phone]   7.   What is the office fax number? [phone] or [phone]  8.   Anesthesia type (None, local, MAC, general) ?    Jacqulynn Cadet 09/28/2021, 1:29 PM  _________________________________________________________________   (provider comments below)

## 2021-09-28 NOTE — Telephone Encounter (Signed)
Not on anticoagulation, will need MD for aspirin/clopidogrel hold

## 2021-09-30 NOTE — Telephone Encounter (Signed)
   Name: Laura Rojas  DOB: 08-11-1946  MRN: [id number]   Primary Cardiologist: Sanda Klein, MD  Chart reviewed as part of pre-operative protocol coverage. Patient was contacted 09/30/2021 in reference to pre-operative risk assessment for pending surgery as outlined below.  Laura Rojas was last seen on 10/07/2020 by Dr. Sallyanne Kuster.  Since that day, Laura Rojas has done well from a cardiac standpoint.  Despite back pain she is able to complete 4 METS without anginal complaints..  Therefore, based on ACC/AHA guidelines, the patient would be at acceptable risk for the planned procedure without further cardiovascular testing.   The patient was advised that if she develops new symptoms prior to surgery to contact our office to arrange for a follow-up visit, and she verbalized understanding.  Per previous recommendation by Dr. Sallyanne Kuster, lack of interval change in cardiac history since that time, patient can hold aspirin and Plavix 5 to 7 days if needed with plans to restart as soon as she is cleared to do so by her surgeon.  I will route this recommendation to the requesting party via Epic fax function and remove from pre-op pool. Please call with questions.  Abigail Butts, PA-C 09/30/2021, 4:29 PM

## 2021-09-30 NOTE — Telephone Encounter (Signed)
Patient calling back.   °

## 2021-10-11 ENCOUNTER — Ambulatory Visit
Admission: RE | Admit: 2021-10-11 | Discharge: 2021-10-11 | Disposition: A | Discharge status: Home / Self Care | Payer: Medicare Other | Source: Ambulatory Visit | Attending: Nurse Practitioner | Admitting: Nurse Practitioner

## 2021-10-11 ENCOUNTER — Other Ambulatory Visit: Payer: Self-pay

## 2021-10-11 DIAGNOSIS — Z79899 Other long term (current) drug therapy: Secondary | ICD-10-CM | POA: Diagnosis not present

## 2021-10-11 DIAGNOSIS — M47896 Other spondylosis, lumbar region: Secondary | ICD-10-CM | POA: Diagnosis not present

## 2021-10-11 DIAGNOSIS — M48061 Spinal stenosis, lumbar region without neurogenic claudication: Secondary | ICD-10-CM | POA: Diagnosis not present

## 2021-10-11 DIAGNOSIS — Z5181 Encounter for therapeutic drug level monitoring: Secondary | ICD-10-CM | POA: Diagnosis not present

## 2021-10-11 DIAGNOSIS — Z1231 Encounter for screening mammogram for malignant neoplasm of breast: Secondary | ICD-10-CM | POA: Diagnosis not present

## 2021-10-11 DIAGNOSIS — M7138 Other bursal cyst, other site: Secondary | ICD-10-CM | POA: Diagnosis not present

## 2021-10-11 DIAGNOSIS — M79604 Pain in right leg: Secondary | ICD-10-CM | POA: Diagnosis not present

## 2021-10-11 DIAGNOSIS — Z79891 Long term (current) use of opiate analgesic: Secondary | ICD-10-CM | POA: Diagnosis not present

## 2021-10-11 DIAGNOSIS — M545 Low back pain, unspecified: Secondary | ICD-10-CM | POA: Diagnosis not present

## 2021-10-11 NOTE — Progress Notes (Signed)
Contacted via MyChart   Normal mammogram, can repeat in one year:)

## 2021-10-19 ENCOUNTER — Ambulatory Visit (INDEPENDENT_AMBULATORY_CARE_PROVIDER_SITE_OTHER): Payer: Medicare Other | Admitting: Cardiovascular Disease

## 2021-10-19 ENCOUNTER — Encounter: Payer: Self-pay | Admitting: Cardiovascular Disease

## 2021-10-19 ENCOUNTER — Other Ambulatory Visit: Payer: Self-pay

## 2021-10-19 VITALS — BP 142/78 | HR 83 | Ht 65.0 in | Wt 159.4 lb

## 2021-10-19 DIAGNOSIS — E782 Mixed hyperlipidemia: Secondary | ICD-10-CM | POA: Diagnosis not present

## 2021-10-19 DIAGNOSIS — I10 Essential (primary) hypertension: Secondary | ICD-10-CM | POA: Diagnosis not present

## 2021-10-19 DIAGNOSIS — I25708 Atherosclerosis of coronary artery bypass graft(s), unspecified, with other forms of angina pectoris: Secondary | ICD-10-CM | POA: Diagnosis not present

## 2021-10-19 DIAGNOSIS — I25118 Atherosclerotic heart disease of native coronary artery with other forms of angina pectoris: Secondary | ICD-10-CM

## 2021-10-19 DIAGNOSIS — I771 Stricture of artery: Secondary | ICD-10-CM | POA: Diagnosis not present

## 2021-10-19 DIAGNOSIS — E1151 Type 2 diabetes mellitus with diabetic peripheral angiopathy without gangrene: Secondary | ICD-10-CM

## 2021-10-19 DIAGNOSIS — I495 Sick sinus syndrome: Secondary | ICD-10-CM

## 2021-10-19 NOTE — Patient Instructions (Signed)

## 2021-10-19 NOTE — Progress Notes (Signed)
Patient ID: Laura Rojas, female   DOB: Dec 10, 1945, 75 y.o.   MRN: [id number]    Cardiology Office Note    Date:  10/21/2021   ID:  Jannice, Beitzel Aug 27, 1946, MRN [id number]  PCP:  Venita Lick, NP  Cardiologist:   Sanda Klein, MD   Chief complaint: CAD follow up  History of Present Illness:  Laura Rojas is a 75 y.o. female with coronary disease, PAD (left subclavian stenosis), DM, HTN and hyperlipidemia, polymyalgia rheumatica.  She is doing well without any active cardiovascular complaints.  The patient specifically denies any chest pain at rest exertion, dyspnea at rest or with exertion, orthopnea, paroxysmal nocturnal dyspnea, syncope, palpitations, focal neurological deficits, intermittent claudication, lower extremity edema, unexplained weight gain, cough, hemoptysis or wheezing.  She denies falls or bleeding problems  We have had to stop her beta-blocker due to complaints of fatigue, with resolution of her complaints.  She is taking statin, aspirin and clopidogrel.  Her most recent LDL cholesterol was 56 on 08/02/2021, but her glycemic control has deteriorated with the most recent hemoglobin A1c 8.2%.  She is receiving treatment with Trulicity, but this is not really helping.  She has borderline renal function with a creatinine of 1.12.  She does not smoke.   She had stents placed in the proximal LAD and proximal RCA (2007, 3.0 x 28 mm drug-eluting Cypher stent to RCA, 3.5 x 33 mm drug-eluting Cypher stent and proximal LAD). She is known to have an untreated stenosis in the very distal portion of the LAD and a 60% "jailed" ostial diagonal stenosis at the level of the proximal LAD stent. She has preserved LV systolic function and has never had congestive heart failure. Her most recent functional study was a nuclear stress test in 2013 that showed normal perfusion and normal LVEF. Her most recent cardiac catheterization was performed in 2009 and both the LAD and RCA stents  were found to be widely patent. Significant comorbidities include diabetes mellitus type 2, hyperlipidemia and hypertension.    Past Medical History:  Diagnosis Date   Arthritis    Shoulders, hands, feet   CAD (coronary artery disease) 12/31/2013   2007, 3.0 x 28 mm drug-eluting Cypher stent to RCA, 3.5 x 33 mm drug-eluting Cypher stent and proximal LAD) Consent by 2009 Normal perfusion by mildly 2013    Diabetes mellitus without complication (HCC)    Dyslipidemia    GERD (gastroesophageal reflux disease)    Hypertension    Polymyalgia rheumatica syndrome ([place])    Subclavian artery stenosis, left ([place]) 01/01/2015    Past Surgical History:  Procedure Laterality Date   BREAST EXCISIONAL BIOPSY Right 1970   EXCISIONAL - NEG   BREAST EXCISIONAL BIOPSY Left 1973   EXCISIONAL - NEG   BREAST SURGERY     CARDIAC CATHETERIZATION  08/25/2008   patent stents   CAROTID DUPLEX  10/2006   NORMAL CAROTID ARTERY DUPLEX   CATARACT EXTRACTION W/PHACO Left 04/13/2020   Procedure: CATARACT EXTRACTION PHACO AND INTRAOCULAR LENS PLACEMENT ([place]) LEFT DIABETIC 4.69  00:44.0;  Surgeon: Birder Robson, MD;  Location: [place];  Service: Ophthalmology;  Laterality: Left;  Diabetic - insulin and oral meds   CATARACT EXTRACTION W/PHACO Right 05/04/2020   Procedure: CATARACT EXTRACTION PHACO AND INTRAOCULAR LENS PLACEMENT ([facility]) RIGHT DIABETIC 3.92  00:27.1;  Surgeon: Birder Robson, MD;  Location: [facility];  Service: Ophthalmology;  Laterality: Right;  Diabetic - insulin and oral meds   COLONOSCOPY  WITH PROPOFOL N/A 08/19/2019   Procedure: COLONOSCOPY WITH PROPOFOL;  Surgeon: Lucilla Lame, MD;  [facility];  Service: Endoscopy;  Laterality: N/A;   CORONARY ANGIOPLASTY WITH STENT PLACEMENT  09/17/2006   PTCA & stenting LAD   ESOPHAGOGASTRODUODENOSCOPY N/A 04/27/2015   Procedure: ESOPHAGOGASTRODUODENOSCOPY (EGD);  Surgeon: Lucilla Lame, MD;  Location: [facility] ENDOSCOPY;  Service:  Endoscopy;  Laterality: N/A;   JOINT REPLACEMENT     NM MYOCAR MULTIPLE W/SPECT  11/2011   EF 72%.NORMAL MYOCARDIAL PERFUSION STUDY   REPLACEMENT TOTAL KNEE  2003 & 2004   TRANSTHORACIC ECHOCARDIOGRAM  09/2006   EF =>55%. IMPAIRED LV RELAXATION. MILD MITRAL ANNULAR CALCIFICATION. AV-MILDLY SCLEROTIC. NO AS.   VAGINAL HYSTERECTOMY  1983    Current Medications: Outpatient Medications Prior to Visit  Medication Sig Dispense Refill   albuterol (VENTOLIN HFA) 108 (90 Base) MCG/ACT inhaler TAKE 2 PUFFS BY MOUTH EVERY 6 HOURS AS NEEDED FOR WHEEZE OR SHORTNESS OF BREATH 18 g 0   amLODipine (NORVASC) 2.5 MG tablet TAKE 1 TABLET BY MOUTH EVERY DAY 90 tablet 0   aspirin 81 MG tablet Take 81 mg by mouth daily.     atorvastatin (LIPITOR) 40 MG tablet TAKE 1 TABLET BY MOUTH EVERY DAY** STOP THE SIMVASTATIN 90 tablet 3   BD PEN NEEDLE NANO U/F 32G X 4 MM MISC SMARTSIG:1 Each SUB-Q 4 Times Daily     benzonatate (TESSALON) 200 MG capsule Take 1 capsule (200 mg total) by mouth 2 (two) times daily as needed for cough. 60 capsule 0   chlorpheniramine-HYDROcodone (TUSSIONEX PENNKINETIC ER) 10-8 MG/5ML SUER Take 5 mLs by mouth every 12 (twelve) hours as needed for cough. 60 mL 0   cholecalciferol (VITAMIN D3) 25 MCG (1000 UT) tablet Take 1,000 Units by mouth daily.     clopidogrel (PLAVIX) 75 MG tablet Take 1 tablet (75 mg total) by mouth daily. 90 tablet 4   Continuous Blood Gluc Sensor (FREESTYLE LIBRE 14 DAY SENSOR) MISC Use 1 kit every 14 (fourteen) days D98.33     folic acid (FOLVITE) 1 MG tablet folic acid 1 mg tablet  TAKE 1 TABLET BY MOUTH EVERY DAY     Gabapentin, Once-Daily, 300 MG TABS Take 300 mg by mouth at bedtime. 90 tablet 4   hydrochlorothiazide (MICROZIDE) 12.5 MG capsule TAKE 1 CAPSULE BY MOUTH EVERY DAY 90 capsule 3   Insulin Glargine (BASAGLAR KWIKPEN) 100 UNIT/ML SOPN Inject 18 Units into the skin at bedtime.     Insulin Pen Needle (BD PEN NEEDLE NANO U/F) 32G X 4 MM MISC Use 1 each 4  (four) times daily     LORazepam (ATIVAN) 0.5 MG tablet TAKE 1 TAB BY MOUTH TWICE A DAY (USE THE ADDITIONAL 30 TABS ONLY SPARINGLY & ONLY FOR SEVERE ANXIETY) 180 tablet 0   losartan (COZAAR) 100 MG tablet TAKE 1 TABLET BY MOUTH EVERY DAY 90 tablet 4   Magnesium 400 MG TABS Take 1 tablet by mouth daily.     metFORMIN (GLUCOPHAGE) 1000 MG tablet Take 500 mg by mouth 2 (two) times daily.     Multiple Vitamin (MULTIVITAMIN) tablet Take 1 tablet by mouth daily.     neomycin-polymyxin b-dexamethasone (MAXITROL) 3.5-10000-0.1 SUSP Place 1 drop into the left eye 3 (three) times daily.     NOVOLOG 100 UNIT/ML injection Inject 10 Units into the skin 3 (three) times daily.  3   nystatin cream (MYCOSTATIN) Apply 1 application topically 2 (two) times daily. 30 g 2   Omega-3 Fatty  Acids (FISH OIL) 1200 MG CAPS Take 1,200 mg by mouth daily.     pantoprazole (PROTONIX) 40 MG tablet TAKE 1 TABLET BY MOUTH EVERY DAY 90 tablet 4   sertraline (ZOLOFT) 100 MG tablet Take 1.5 tablets (150 mg total) by mouth daily. 135 tablet 4   tiZANidine (ZANAFLEX) 4 MG tablet      vitamin B-12 (CYANOCOBALAMIN) 1000 MCG tablet Take 1,000 mcg by mouth daily.     Facility-Administered Medications Prior to Visit  Medication Dose Route Frequency Provider Last Rate Last Admin   ipratropium-albuterol (DUONEB) 0.5-2.5 (3) MG/3ML nebulizer solution 3 mL  3 mL Nebulization Once Cannady, Jolene T, NP         Allergies:   Compazine [prochlorperazine edisylate], Altace [ramipril], Crestor [rosuvastatin calcium], Reglan [metoclopramide], Sulfa antibiotics, Actos [pioglitazone], Amlodipine, Latex, and Lipitor [atorvastatin]   Social History   Socioeconomic History   Marital status: Married    Spouse name: Not on file   Number of children: Not on file   Years of education: Not on file   Highest education level: Some college, no degree  Occupational History   Not on file  Tobacco Use   Smoking status: Never   Smokeless tobacco: Never   Vaping Use   Vaping Use: Never used  Substance and Sexual Activity   Alcohol use: No    Alcohol/week: 0.0 standard drinks   Drug use: Never   Sexual activity: Not on file  Other Topics Concern   Not on file  Social History Narrative   Not on file   Social Determinants of Health   Financial Resource Strain: Low Risk    Difficulty of Paying Living Expenses: Not hard at all  Food Insecurity: No Food Insecurity   Worried About Charity fundraiser in the Last Year: Never true   Ran Out of Food in the Last Year: Never true  Transportation Needs: No Transportation Needs   Lack of Transportation (Medical): No   Lack of Transportation (Non-Medical): No  Physical Activity: Inactive   Days of Exercise per Week: 0 days   Minutes of Exercise per Session: 0 min  Stress: No Stress Concern Present   Feeling of Stress : Not at all  Social Connections: Not on file     Family History:  The patient's family history includes Brain cancer in her grandchild; Breast cancer (age of onset: 57) in her mother; Diabetes in her sister; Heart disease in her maternal grandfather and maternal grandmother.   ROS:   Please see the history of present illness.    ROS all other systems are reviewed and are negative  PHYSICAL EXAM:   VS:  BP (!) 142/78   Pulse 83   Ht 5' 5"  (1.651 m)   Wt 159 lb 6.4 oz (72.3 kg)   SpO2 93%   BMI 26.53 kg/m     General: Alert, oriented x3, no distress, very mildly overweight Head: no evidence of trauma, PERRL, EOMI, no exophtalmos or lid lag, no myxedema, no xanthelasma; normal ears, nose and oropharynx Neck: normal jugular venous pulsations and no hepatojugular reflux; brisk carotid pulses without delay and no carotid bruits Chest: clear to auscultation, no signs of consolidation by percussion or palpation, normal fremitus, symmetrical and full respiratory excursions Cardiovascular: normal position and quality of the apical impulse, regular rhythm, normal first and  second heart sounds, no murmurs, rubs or gallops Abdomen: no tenderness or distention, no masses by palpation, no abnormal pulsatility or arterial bruits, normal bowel  sounds, no hepatosplenomegaly Extremities: no clubbing, cyanosis or edema; 2+ radial, ulnar and brachial pulses bilaterally; 2+ right femoral, posterior tibial and dorsalis pedis pulses; 2+ left femoral, posterior tibial and dorsalis pedis pulses; no subclavian or femoral bruits Neurological: grossly nonfocal Psych: Normal mood and affect   Wt Readings from Last 3 Encounters:  10/19/21 159 lb 6.4 oz (72.3 kg)  09/02/21 165 lb (74.8 kg)  08/05/21 160 lb (72.6 kg)      Studies/Labs Reviewed:   EKG:  EKG is ordered today.  Personally reviewed, shows normal sinus with generalized low voltage and nonspecific ST-T changes  Recent Labs: 01/11/2021: ALT 22 08/02/2021: BUN 22; Creatinine, Ser 1.12; Hemoglobin 11.4; Magnesium 1.7; Platelets 487; Potassium 4.0; Sodium 138   Lipid Panel    Component Value Date/Time   CHOL 150 08/02/2021 1348   CHOL 170 05/08/2019 0939   TRIG 201 (H) 08/02/2021 1348   TRIG 367 (H) 05/08/2019 0939   HDL 62 08/02/2021 1348   CHOLHDL 2.9 08/28/2018 1054   VLDL 73 (H) 05/08/2019 0939   LDLCALC 56 08/02/2021 1348    ASSESSMENT:    1. Coronary artery disease of native artery of native heart with stable angina pectoris ([place])   2. Essential hypertension   3. SSS (sick sinus syndrome) ([place])   4. Diabetes mellitus with peripheral vascular disease ([place])   5. Mixed hyperlipidemia   6. Stenosis of left subclavian artery (HCC)      PLAN:  In order of problems listed above:  CAD: Beta-blocker stopped due to fatigue.  On amlodipine as her only antianginal medication.  On aspirin and Plavix without bleeding problems.  On a highly active statin. HTN: Satisfactory control.  At home her systolic blood pressure is usually less than 130. SSS: Had symptoms consistent with chronotropic incompetence that  resolved after he stopped the beta-blocker.  No indication for pacemaker yet. DM: Glycemic control has deteriorated.  Consider SGLT2 inhibitor such as Jardiance or Farxiga, especially if the Trulicity does not help correct her hemoglobin A1c. HLP: Excellent LDL cholesterol.  Tolerating atorvastatin without side effects.  Has a good HDL cholesterol, but her triglycerides have increased likely due to the higher glucose levels. PAD: Has a slightly lower blood pressure in the left upper extremity suggesting left subclavian stenosis, although this was not seen on previous duplex ultrasonography in 2016.  She does not have symptoms of claudication or vertebral steal syndrome and had antegrade flow in both vertebral arteries.     Medication Adjustments/Labs and Tests Ordered: Current medicines are reviewed at length with the patient today.  Concerns regarding medicines are outlined above.  Medication changes, Labs and Tests ordered today are listed in the Patient Instructions below. Patient Instructions  Medication Instructions:  No changes *If you need a refill on your cardiac medications before your next appointment, please call your pharmacy*   Lab Work: None ordered If you have labs (blood work) drawn today and your tests are completely normal, you will receive your results only by: [place] (if you have MyChart) OR A paper copy in the mail If you have any lab test that is abnormal or we need to change your treatment, we will call you to review the results.   Testing/Procedures: None ordered   Follow-Up: At [facility], you and your health needs are our priority.  As part of our continuing mission to provide you with exceptional heart care, we have created designated Provider Care Teams.  These Care Teams include your  primary Cardiologist (physician) and Advanced Practice Providers (APPs -  Physician Assistants and Nurse Practitioners) who all work together to provide you with the  care you need, when you need it.  We recommend signing up for the patient portal called "MyChart".  Sign up information is provided on this After Visit Summary.  MyChart is used to connect with patients for Virtual Visits (Telemedicine).  Patients are able to view lab/test results, encounter notes, upcoming appointments, etc.  Non-urgent messages can be sent to your provider as well.   To learn more about what you can do with MyChart, go to [email or web address].    Your next appointment:   12 month(s)  The format for your next appointment:   In Person  Provider:   Sanda Klein, MD      Signed, Sanda Klein, MD  10/21/2021 12:30 PM    [facility] [address], [place], [place]  [id number] Phone: [phone]; Fax: [phone]

## 2021-10-23 ENCOUNTER — Other Ambulatory Visit: Payer: Self-pay | Admitting: Nurse Practitioner

## 2021-10-24 NOTE — Telephone Encounter (Signed)
Requested Prescriptions  Pending Prescriptions Disp Refills  . sertraline (ZOLOFT) 100 MG tablet [Pharmacy Med Name: SERTRALINE HCL 100 MG TABLET] 135 tablet 1    Sig: TAKE 1.5 TABLETS BY MOUTH DAILY     Psychiatry:  Antidepressants - SSRI Passed - 10/23/2021  9:36 PM      Passed - Completed PHQ-2 or PHQ-9 in the last 360 days      Passed - Valid encounter within last 6 months    Recent Outpatient Visits          2 months ago Cough   [place], Jolene T, NP   2 months ago Type 2 diabetes mellitus with hyperglycemia, with long-term current use of insulin ([place])   [place], [place] T, NP   2 months ago History of 2019 novel coronavirus disease (COVID-19)   [place] Cannady, [place] T, NP   3 months ago Close exposure to COVID-19 virus   [employer], Filley T, NP   5 months ago Type 2 diabetes mellitus with hyperglycemia, with long-term current use of insulin ([place])   [place], Barbaraann Faster, NP      Future Appointments            In 2 weeks Venita Lick, NP [employer], [facility]   In 10 months Ralene Bathe, MD [place]   In 10 months  [facility], [facility]

## 2021-10-25 DIAGNOSIS — M0609 Rheumatoid arthritis without rheumatoid factor, multiple sites: Secondary | ICD-10-CM | POA: Diagnosis not present

## 2021-10-26 ENCOUNTER — Telehealth: Payer: Self-pay | Admitting: Cardiovascular Disease

## 2021-10-26 NOTE — Telephone Encounter (Signed)
Left message for patient to return the call.

## 2021-10-26 NOTE — Telephone Encounter (Signed)
Laura Rojas is returning Laura Rojas's call.

## 2021-10-26 NOTE — Telephone Encounter (Signed)
Patient was returning call to Nurse

## 2021-10-26 NOTE — Telephone Encounter (Signed)
Please call her cell phone [phone]

## 2021-10-26 NOTE — Telephone Encounter (Signed)
Pt says that she is supposed to keep track of her blood pressure and report back to Dr. Loletha Grayer... pt didn't write them down but says that everytime it was high.. top number is always in the 160's

## 2021-10-26 NOTE — Telephone Encounter (Signed)
Called pt to let her know the provider needs numbers to make proper recommendations. She verbalized understanding. She will start taking her blood pressure at least once daily with the heart rate and recording it. She will call back or send a MyChart message in 1-2 weeks with the readings.

## 2021-11-06 ENCOUNTER — Other Ambulatory Visit: Payer: Self-pay | Admitting: Nurse Practitioner

## 2021-11-06 DIAGNOSIS — I10 Essential (primary) hypertension: Secondary | ICD-10-CM

## 2021-11-07 ENCOUNTER — Telehealth: Payer: Self-pay

## 2021-11-07 NOTE — Telephone Encounter (Signed)
Requested Prescriptions  Pending Prescriptions Disp Refills  . hydrochlorothiazide (MICROZIDE) 12.5 MG capsule [Pharmacy Med Name: HYDROCHLOROTHIAZIDE 12.5 MG CP] 90 capsule 0    Sig: TAKE 1 CAPSULE BY MOUTH EVERY DAY     Cardiovascular: Diuretics - Thiazide Failed - 11/06/2021 10:41 PM      Failed - Cr in normal range and within 360 days    Creatinine  Date Value Ref Range Status  04/29/2021 106.2 20.0 - 300.0 mg/dL Final   Creatinine, Ser  Date Value Ref Range Status  08/02/2021 1.12 (H) 0.57 - 1.00 mg/dL Final         Failed - Last BP in normal range    BP Readings from Last 1 Encounters:  10/19/21 (!) 142/78         Passed - Ca in normal range and within 360 days    Calcium  Date Value Ref Range Status  08/02/2021 9.6 8.7 - 10.3 mg/dL Final         Passed - K in normal range and within 360 days    Potassium  Date Value Ref Range Status  08/02/2021 4.0 3.5 - 5.2 mmol/L Final         Passed - Na in normal range and within 360 days    Sodium  Date Value Ref Range Status  08/02/2021 138 134 - 144 mmol/L Final         Passed - Valid encounter within last 6 months    Recent Outpatient Visits          3 months ago Cough   [place], Jolene T, NP   3 months ago Type 2 diabetes mellitus with hyperglycemia, with long-term current use of insulin ([place])   [place], [place] T, NP   3 months ago History of 2019 novel coronavirus disease (COVID-19)   [place], [place] T, NP   3 months ago Close exposure to COVID-19 virus   [employer], [place] T, NP   6 months ago Type 2 diabetes mellitus with hyperglycemia, with long-term current use of insulin ([place])   [place], Barbaraann Faster, NP      Future Appointments            In 2 weeks Venita Lick, NP [employer]   In 9 months Ralene Bathe, MD [place]   In 10 months  [facility] [place]

## 2021-11-07 NOTE — Chronic Care Management (AMB) (Signed)
Chronic Care Management Pharmacy Assistant   Name: ABBAGALE GOGUEN  MRN: [id number] DOB: 1946/01/23  Reason for Encounter: Disease State Diabetes Mellitus   Recent office visits:  08/05/21-Jolene T. Ned Card, NP (PCP) Seen for a cough. Follow up in 3 months. 08/02/21-Jolene T. Ned Card, NP (PCP) General follow up visit. Labs ordered. Follow up in 3 days. 07/29/21-Jolene T. Ned Card, NP (PCP) Seen for a cough. Start on  Augmentin for 7 days + Prednisone 40 MG daily for 5 days. Chest xray ordered. Return for as scheduled on Tuesday. 07/20/21-Jolene T. Sammuel Hines, NP (PCP) Seen for cough and diarrhea. Follow up in 10 days.  Recent consult visits:  10/19/21-Mihai Croitoru, MD (Cardiology) General follow up visit. Follow up in 12 months. 08/30/21-Mayur Stefan Church (Rheumatology) Seen for a follow up visit. Follow up in 4 months. 08/29/21-David Isac Caddy, MD (Dermatology) Seen for annual exam. Follow up in 1 year. 05/16/21-Hiary Kristine Linea (Endocrinology) Diabetic follow up. Increase Basaglar from 18 to 20 units. Follow up in 3 months.  Hospital visits:  None in previous 6 months  Medications: Outpatient Encounter Medications as of 11/07/2021  Medication Sig Note   albuterol (VENTOLIN HFA) 108 (90 Base) MCG/ACT inhaler TAKE 2 PUFFS BY MOUTH EVERY 6 HOURS AS NEEDED FOR WHEEZE OR SHORTNESS OF BREATH    amLODipine (NORVASC) 2.5 MG tablet TAKE 1 TABLET BY MOUTH EVERY DAY    aspirin 81 MG tablet Take 81 mg by mouth daily.    atorvastatin (LIPITOR) 40 MG tablet TAKE 1 TABLET BY MOUTH EVERY DAY** STOP THE SIMVASTATIN    BD PEN NEEDLE NANO U/F 32G X 4 MM MISC SMARTSIG:1 Each SUB-Q 4 Times Daily    benzonatate (TESSALON) 200 MG capsule Take 1 capsule (200 mg total) by mouth 2 (two) times daily as needed for cough.    chlorpheniramine-HYDROcodone (TUSSIONEX PENNKINETIC ER) 10-8 MG/5ML SUER Take 5 mLs by mouth every 12 (twelve) hours as needed for cough.    cholecalciferol (VITAMIN D3) 25 MCG  (1000 UT) tablet Take 1,000 Units by mouth daily.    clopidogrel (PLAVIX) 75 MG tablet Take 1 tablet (75 mg total) by mouth daily.    Continuous Blood Gluc Sensor (FREESTYLE LIBRE 14 DAY SENSOR) MISC Use 1 kit every 14 (fourteen) days J28.20    folic acid (FOLVITE) 1 MG tablet folic acid 1 mg tablet  TAKE 1 TABLET BY MOUTH EVERY DAY    Gabapentin, Once-Daily, 300 MG TABS Take 300 mg by mouth at bedtime.    hydrochlorothiazide (MICROZIDE) 12.5 MG capsule TAKE 1 CAPSULE BY MOUTH EVERY DAY    Insulin Glargine (BASAGLAR KWIKPEN) 100 UNIT/ML SOPN Inject 18 Units into the skin at bedtime.    Insulin Pen Needle (BD PEN NEEDLE NANO U/F) 32G X 4 MM MISC Use 1 each 4 (four) times daily    LORazepam (ATIVAN) 0.5 MG tablet TAKE 1 TAB BY MOUTH TWICE A DAY (USE THE ADDITIONAL 30 TABS ONLY SPARINGLY & ONLY FOR SEVERE ANXIETY)    losartan (COZAAR) 100 MG tablet TAKE 1 TABLET BY MOUTH EVERY DAY    Magnesium 400 MG TABS Take 1 tablet by mouth daily.    metFORMIN (GLUCOPHAGE) 1000 MG tablet Take 500 mg by mouth 2 (two) times daily.    Multiple Vitamin (MULTIVITAMIN) tablet Take 1 tablet by mouth daily.    neomycin-polymyxin b-dexamethasone (MAXITROL) 3.5-10000-0.1 SUSP Place 1 drop into the left eye 3 (three) times daily.    NOVOLOG 100 UNIT/ML injection Inject 10 Units into  the skin 3 (three) times daily. 03/03/2021: 20 units + sliding scale Take 2 units of NovoLog if your sugar is 150-200 Take 4 units of NovoLog if your sugar is 201-250 Take 6 units of NovoLog if your sugar is 251-300 Take 8 units of NovoLog if your sugar is 301-350 Take 10 units of NovoLog if your sugar is 351-400 Take 12 units of NovoLog if your sugar is over 401      nystatin cream (MYCOSTATIN) Apply 1 application topically 2 (two) times daily.    Omega-3 Fatty Acids (FISH OIL) 1200 MG CAPS Take 1,200 mg by mouth daily.    pantoprazole (PROTONIX) 40 MG tablet TAKE 1 TABLET BY MOUTH EVERY DAY    sertraline (ZOLOFT) 100 MG tablet TAKE 1.5  TABLETS BY MOUTH DAILY    tiZANidine (ZANAFLEX) 4 MG tablet     vitamin B-12 (CYANOCOBALAMIN) 1000 MCG tablet Take 1,000 mcg by mouth daily.    Facility-Administered Encounter Medications as of 11/07/2021  Medication   ipratropium-albuterol (DUONEB) 0.5-2.5 (3) MG/3ML nebulizer solution 3 mL   Current antihyperglycemic regimen:  Metformin 1000 mg take 500 mg twice daily Basaglar Kwikpen inject 18 units at bedtime Novolog 100 unit inject 10 units three times daily.   Adherence Review: Is the patient currently on a STATIN medication? Yes Is the patient currently on ACE/ARB medication? Yes Does the patient have >5 day gap between last estimated fill dates? No  Unsuccessful attempt to completed assessment call. I have left 3 voicemail's to return phone call as well.   Care Gaps: COVID-19 Vaccine:Overdue since 10/27/2020  Star Rating Drugs: Atorvastatin 40 mg Last filled:09/19/21 90 DS Metformin 1000 mg Last filled:08/16/2021 Losartan 100 mg Last filled:09/29/21 90 DS   Myriam Elta [place], [place]

## 2021-11-08 ENCOUNTER — Ambulatory Visit: Payer: Medicare Other | Admitting: Nurse Practitioner

## 2021-11-22 DIAGNOSIS — M0609 Rheumatoid arthritis without rheumatoid factor, multiple sites: Secondary | ICD-10-CM | POA: Diagnosis not present

## 2021-11-25 ENCOUNTER — Other Ambulatory Visit: Payer: Self-pay | Admitting: Nurse Practitioner

## 2021-11-25 ENCOUNTER — Encounter: Payer: Self-pay | Admitting: Nurse Practitioner

## 2021-11-25 ENCOUNTER — Other Ambulatory Visit: Payer: Self-pay

## 2021-11-25 ENCOUNTER — Ambulatory Visit (INDEPENDENT_AMBULATORY_CARE_PROVIDER_SITE_OTHER): Payer: Medicare Other | Admitting: Nurse Practitioner

## 2021-11-25 VITALS — BP 117/70 | HR 76 | Temp 97.6°F | Ht 65.0 in | Wt 159.0 lb

## 2021-11-25 DIAGNOSIS — F413 Other mixed anxiety disorders: Secondary | ICD-10-CM

## 2021-11-25 DIAGNOSIS — E1169 Type 2 diabetes mellitus with other specified complication: Secondary | ICD-10-CM

## 2021-11-25 DIAGNOSIS — E1122 Type 2 diabetes mellitus with diabetic chronic kidney disease: Secondary | ICD-10-CM

## 2021-11-25 DIAGNOSIS — F339 Major depressive disorder, recurrent, unspecified: Secondary | ICD-10-CM

## 2021-11-25 DIAGNOSIS — I771 Stricture of artery: Secondary | ICD-10-CM

## 2021-11-25 DIAGNOSIS — E559 Vitamin D deficiency, unspecified: Secondary | ICD-10-CM

## 2021-11-25 DIAGNOSIS — E1165 Type 2 diabetes mellitus with hyperglycemia: Secondary | ICD-10-CM | POA: Diagnosis not present

## 2021-11-25 DIAGNOSIS — I152 Hypertension secondary to endocrine disorders: Secondary | ICD-10-CM

## 2021-11-25 DIAGNOSIS — K120 Recurrent oral aphthae: Secondary | ICD-10-CM | POA: Insufficient documentation

## 2021-11-25 DIAGNOSIS — Z79899 Other long term (current) drug therapy: Secondary | ICD-10-CM | POA: Diagnosis not present

## 2021-11-25 DIAGNOSIS — E538 Deficiency of other specified B group vitamins: Secondary | ICD-10-CM | POA: Diagnosis not present

## 2021-11-25 DIAGNOSIS — N183 Chronic kidney disease, stage 3 unspecified: Secondary | ICD-10-CM

## 2021-11-25 DIAGNOSIS — I25708 Atherosclerosis of coronary artery bypass graft(s), unspecified, with other forms of angina pectoris: Secondary | ICD-10-CM | POA: Diagnosis not present

## 2021-11-25 DIAGNOSIS — E785 Hyperlipidemia, unspecified: Secondary | ICD-10-CM

## 2021-11-25 DIAGNOSIS — E1159 Type 2 diabetes mellitus with other circulatory complications: Secondary | ICD-10-CM | POA: Diagnosis not present

## 2021-11-25 DIAGNOSIS — Z794 Long term (current) use of insulin: Secondary | ICD-10-CM

## 2021-11-25 DIAGNOSIS — M353 Polymyalgia rheumatica: Secondary | ICD-10-CM | POA: Diagnosis not present

## 2021-11-25 LAB — MICROALBUMIN, URINE WAIVED
Creatinine, Urine Waived: 200 mg/dL (ref 10–300)
Microalb, Ur Waived: 30 mg/L — ABNORMAL HIGH (ref 0–19)
Microalb/Creat Ratio: <30 mg/g (ref <30)

## 2021-11-25 LAB — BAYER DCA HB A1C WAIVED: HB A1C (BAYER DCA - WAIVED): 8.4 % — ABNORMAL HIGH (ref 4.8–5.6)

## 2021-11-25 MED ORDER — VALACYCLOVIR HCL 1 G PO TABS: 1000.0000 mg | ORAL_TABLET | Freq: Every day | ORAL | 6 refills | Status: AC

## 2021-11-25 MED ORDER — LORAZEPAM 0.5 MG PO TABS: ORAL_TABLET | ORAL | 0 refills | Status: DC

## 2021-11-25 MED ORDER — BASAGLAR KWIKPEN 100 UNIT/ML ~~LOC~~ SOPN: 10.0000 [IU] | PEN_INJECTOR | Freq: Every day | SUBCUTANEOUS | 4 refills | Status: DC

## 2021-11-25 MED ORDER — DAPAGLIFLOZIN PROPANEDIOL 10 MG PO TABS: 10.0000 mg | ORAL_TABLET | Freq: Every day | ORAL | 4 refills | Status: DC

## 2021-11-25 NOTE — Assessment & Plan Note (Signed)
Chronic, ongoing.  Continue current medication regimen and adjust as needed.  Lipid panel today.  Discussed with patient, if myalgias with Atorvastatin, + her history of myalgia with statins, could consider Repatha initiation if poor tolerance to new regimen.

## 2021-11-25 NOTE — Assessment & Plan Note (Signed)
Stable, continue collaboration with cardiology and preventative treatment ASA, Plavix, and statin.

## 2021-11-25 NOTE — Assessment & Plan Note (Signed)
Chronic, stable with BP at goal in office today.  Continue current medication regimen and collaboration with cardiology. CMP & CBC on labs today.  Recommend she monitor BP at least three days a week at home and document for providers + bring to visits.  DASH diet focus.  Return in 3 months.

## 2021-11-25 NOTE — Assessment & Plan Note (Signed)
Chronic, ongoing, followed by endocrinology with A1C today 8.4%, slight trend up.  She wishes to avoid return to endo at this time as her previous provider left.  Will start [place] 10 MG daily for diabetes and kidney + heart protection + recommend she restart Basaglar at 10 units + cut back on her sliding scale with this, continue Metformin.  Continue collaboration with CCM team in office.   Bring Libre to next visit.  Return to office in 4 weeks.

## 2021-11-25 NOTE — Assessment & Plan Note (Signed)
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  Will trial Valtrex and patient to notify provider if no improvement with this.  Renal dose.

## 2021-11-25 NOTE — Assessment & Plan Note (Signed)
Chronic, stable.  Continue Losartan for kidney protection.  Urine micro in office February 2022 =  ALB 30 and A:C <30 -- recheck today. Continue collaboration with cardiology and endocrinology.  Consider nephrology referral if worsening function.  CMP and CBC today.

## 2021-11-25 NOTE — Patient Instructions (Addendum)
Restart Basaglar at 10 units only to help cut back on sliding scale use -- only use sliding scale before meals.  Start [place] daily.  Diabetes Mellitus and Nutrition, Adult When you have diabetes, or diabetes mellitus, it is very important to have healthy eating habits because your blood sugar (glucose) levels are greatly affected by what you eat and drink. Eating healthy foods in the right amounts, at about the same times every day, can help you: Manage your blood glucose. Lower your risk of heart disease. Improve your blood pressure. Reach or maintain a healthy weight. What can affect my meal plan? Every person with diabetes is different, and each person has different needs for a meal plan. Your health care provider may recommend that you work with a dietitian to make a meal plan that is best for you. Your meal plan may vary depending on factors such as: The calories you need. The medicines you take. Your weight. Your blood glucose, blood pressure, and cholesterol levels. Your activity level. Other health conditions you have, such as heart or kidney disease. How do carbohydrates affect me? Carbohydrates, also called carbs, affect your blood glucose level more than any other type of food. Eating carbs raises the amount of glucose in your blood. It is important to know how many carbs you can safely have in each meal. This is different for every person. Your dietitian can help you calculate how many carbs you should have at each meal and for each snack. How does alcohol affect me? Alcohol can cause a decrease in blood glucose (hypoglycemia), especially if you use insulin or take certain diabetes medicines by mouth. Hypoglycemia can be a life-threatening condition. Symptoms of hypoglycemia, such as sleepiness, dizziness, and confusion, are similar to symptoms of having too much alcohol. Do not drink alcohol if: Your health care provider tells you not to drink. You are pregnant, may be pregnant,  or are planning to become pregnant. If you drink alcohol: Limit how much you have to: 0-1 drink a day for women. 0-2 drinks a day for men. Know how much alcohol is in your drink. In the [place] one drink equals one 12 oz bottle of beer (355 mL), one 5 oz glass of wine (148 mL), or one 1 oz glass of hard liquor (44 mL). Keep yourself hydrated with water, diet soda, or unsweetened iced tea. Keep in mind that regular soda, juice, and other mixers may contain a lot of sugar and must be counted as carbs. What are tips for following this plan? Reading food labels Start by checking the serving size on the Nutrition Facts label of packaged foods and drinks. The number of calories and the amount of carbs, fats, and other nutrients listed on the label are based on one serving of the item. Many items contain more than one serving per package. Check the total grams (g) of carbs in one serving. Check the number of grams of saturated fats and trans fats in one serving. Choose foods that have a low amount or none of these fats. Check the number of milligrams (mg) of salt (sodium) in one serving. Most people should limit total sodium intake to less than 2,300 mg per day. Always check the nutrition information of foods labeled as "low-fat" or "nonfat." These foods may be higher in added sugar or refined carbs and should be avoided. Talk to your dietitian to identify your daily goals for nutrients listed on the label. Shopping Avoid buying canned, pre-made, or processed foods.  These foods tend to be high in fat, sodium, and added sugar. Shop around the outside edge of the grocery store. This is where you will most often find fresh fruits and vegetables, bulk grains, fresh meats, and fresh dairy products. Cooking Use low-heat cooking methods, such as baking, instead of high-heat cooking methods, such as deep frying. Cook using healthy oils, such as olive, canola, or sunflower oil. Avoid cooking with butter, cream,  or high-fat meats. Meal planning Eat meals and snacks regularly, preferably at the same times every day. Avoid going long periods of time without eating. Eat foods that are high in fiber, such as fresh fruits, vegetables, beans, and whole grains. Eat 4-6 oz (112-168 g) of lean protein each day, such as lean meat, chicken, fish, eggs, or tofu. One ounce (oz) (28 g) of lean protein is equal to: 1 oz (28 g) of meat, chicken, or fish. 1 egg.  cup (62 g) of tofu. Eat some foods each day that contain healthy fats, such as avocado, nuts, seeds, and fish. What foods should I eat? Fruits Berries. Apples. Oranges. Peaches. Apricots. Plums. Grapes. Mangoes. Papayas. Pomegranates. Kiwi. Cherries. Vegetables Leafy greens, including lettuce, spinach, kale, chard, collard greens, mustard greens, and cabbage. Beets. Cauliflower. Broccoli. Carrots. Green beans. Tomatoes. Peppers. Onions. Cucumbers. Brussels sprouts. Grains Whole grains, such as whole-wheat or whole-grain bread, crackers, tortillas, cereal, and pasta. Unsweetened oatmeal. Quinoa. Brown or wild rice. Meats and other proteins Seafood. Poultry without skin. Lean cuts of poultry and beef. Tofu. Nuts. Seeds. Dairy Low-fat or fat-free dairy products such as milk, yogurt, and cheese. The items listed above may not be a complete list of foods and beverages you can eat and drink. Contact a dietitian for more information. What foods should I avoid? Fruits Fruits canned with syrup. Vegetables Canned vegetables. Frozen vegetables with butter or cream sauce. Grains Refined white flour and flour products such as bread, pasta, snack foods, and cereals. Avoid all processed foods. Meats and other proteins Fatty cuts of meat. Poultry with skin. Breaded or fried meats. Processed meat. Avoid saturated fats. Dairy Full-fat yogurt, cheese, or milk. Beverages Sweetened drinks, such as soda or iced tea. The items listed above may not be a complete list of  foods and beverages you should avoid. Contact a dietitian for more information. Questions to ask a health care provider Do I need to meet with a certified diabetes care and education specialist? Do I need to meet with a dietitian? What number can I call if I have questions? When are the best times to check my blood glucose? Where to find more information: American Diabetes Association: diabetes.org Academy of Nutrition and Dietetics: eatright.[employer] of Diabetes and Digestive and Kidney Diseases: [employer] Association of Diabetes Care & Education Specialists: diabeteseducator.org Summary It is important to have healthy eating habits because your blood sugar (glucose) levels are greatly affected by what you eat and drink. It is important to use alcohol carefully. A healthy meal plan will help you manage your blood glucose and lower your risk of heart disease. Your health care provider may recommend that you work with a dietitian to make a meal plan that is best for you. This information is not intended to replace advice given to you by your health care provider. Make sure you discuss any questions you have with your health care provider. Document Revised: 06/16/2020 Document Reviewed: 06/16/2020 Elsevier Patient Education  [place].

## 2021-11-25 NOTE — Assessment & Plan Note (Signed)
Continue collaboration with cardiology and preventative treatment.

## 2021-11-25 NOTE — Assessment & Plan Note (Signed)
Chronic, ongoing.  Denies SI/HI.  Continue Sertraline 150 MG daily as is ordered. This will benefit mood overall and help reduce benzo use.  Consider increase to 200 MG in future for increased benefit.  Return in 3 months.

## 2021-11-25 NOTE — Assessment & Plan Note (Signed)
Ongoing, continue supplement and recheck level every 6 months. 

## 2021-11-25 NOTE — Assessment & Plan Note (Signed)
Chronic, ongoing.  Continue collaboration with rheumatology and current medication regimen.

## 2021-11-25 NOTE — Assessment & Plan Note (Signed)
Chronic, ongoing.  Denies SI/HI.  Continue Sertraline and Ativan.  Has had multiple attempts at reduction of benzo with no success.  Discussed at length risks with her and she is aware.  Continue Ativan and fill upon request.  PDMP reviewed.  Return in 3 months, has up to date UDS (last UDS 04/29/21) and contract.

## 2021-11-25 NOTE — Assessment & Plan Note (Signed)
Chronic, ongoing for insomnia.  Has had trials of other medications and reductions with poor outcomes.  Fully aware of risks of chronic benzo use and aware this will be discussed each visit.  UDS up to date.

## 2021-11-25 NOTE — Telephone Encounter (Signed)
Requested medication (s) are due for refill today: no  Requested medication (s) are on the active medication list: yes  Last refill:  today  Future visit scheduled: yes  Notes to clinic:  see pharmacy note. Medication isn't covered by insurance, asking for alternative or sending PA     Requested Prescriptions  Pending Prescriptions Disp Refills   Insulin Glargine (BASAGLAR KWIKPEN) 100 UNIT/ML [Pharmacy Med Name: BASAGLAR 100 UNIT/ML [place]  0     Endocrinology:  Diabetes - Insulins Failed - 11/25/2021 10:30 AM      Failed - HBA1C is between 0 and 7.9 and within 180 days    Hemoglobin A1C  Date Value Ref Range Status  12/31/2019 8.2  Final   HB A1C (BAYER DCA - WAIVED)  Date Value Ref Range Status  08/02/2021 8.2 (H) 4.8 - 5.6 % Final    Comment:             Prediabetes: 5.7 - 6.4          Diabetes: >6.4          Glycemic control for adults with diabetes: <7.0               **Please note reference interval change**           Passed - Valid encounter within last 6 months    Recent Outpatient Visits           Today Type 2 diabetes mellitus with hyperglycemia, with long-term current use of insulin ([place])   [place], [place] T, NP   3 months ago [place]   [place], [place] T, NP   3 months ago Type 2 diabetes mellitus with hyperglycemia, with long-term current use of insulin ([place])   [place], [place] T, NP   3 months ago History of 2019 novel coronavirus disease (COVID-19)   Reeves, Jolene T, NP   4 months ago Close exposure to COVID-19 virus   [employer], Barbaraann Faster, NP       Future Appointments             In 4 weeks Venita Lick, NP [employer], [facility]   In 9 months Ralene Bathe, MD [place]   In 9 months  [facility], [place]

## 2021-11-25 NOTE — Assessment & Plan Note (Deleted)
Ongoing, continue daily supplement and recheck levels next visit.

## 2021-11-25 NOTE — Progress Notes (Signed)
BP 117/70    Pulse 76    Temp 97.6 F (36.4 C)    Ht 5' 5" (1.651 m)    Wt 159 lb (72.1 kg)    SpO2 97%    BMI 26.46 kg/m    Subjective:    Patient ID: Laura Rojas, female    DOB: January 14, 1946, 75 y.o.   MRN: [id number]  HPI: Laura Rojas is a 75 y.o. female  Chief Complaint  Patient presents with   Hypertension   Hyperlipidemia   Diabetes    Patient states she would like to discuss Rakin Lemelle taking over her Diabetes as her Endocrinologist has left [facility].    Mood   Oral Swelling    Patient states she has a oral breakout that comes every once and a while and she is currently having an outbreak and she states she has never tried Benadryl during a breakout and would like to discuss with provider.     SORES IN MOUTH Has had these off and on for a few years, they get on the sides of her mouth and mainly on tongue.  They look blistery in appearance.  Sometimes they look like cuts.  When present they burn and sting + are red in appearance. Episodes will last a few days and then go away.  She uses warm water, peroxide and water -- this helps improve symptoms.  At times they appear when she is under more stress.    DIABETES Followed by endocrinology and last saw NP [facility] 05/16/21 with Basaglar increased to 20 units.  Her last A1c was 8.2% in June.  She continues on Basaglar, Novolog sliding scale, and Metformin.  She reports her readings today will be horrible due to Christmas and Prednisone.  Has not been on Basaglar for awhile as had no heard from endo, her NP left from there and she would like to stay with PCP for now.  She did not tolerate GLP1 in past. Hypoglycemic episodes: a few  Polydipsia/polyuria: no Visual disturbance: no Chest pain: no Paresthesias: no Glucose Monitoring: yes             Accucheck frequency: Daily             Fasting glucose: 100 to 250 range (all over)             Post prandial:             Evening:              Before meals: Taking Insulin?:  yes             Long acting insulin: 20 units (has not taken in 6 months)             Short acting insulin: siding scale units with meals (16-24 units) Blood Pressure Monitoring: rarely Retinal Examination: Up to Date Foot Exam: Up to Date Pneumovax: Up to Date Influenza: Up to Date Aspirin: yes   HYPERTENSION / HYPERLIPIDEMIA/CAD She is followed by cardiology, Dr. Sallyanne Kuster. Last saw 10/19/21.  Cardiology follows due to subclavian artery stenosis and angina. Continues on Amlodipine, Plavix, HCTZ, Fish oil, Losartan, Lipitor.  Satisfied with current treatment? yes Duration of hypertension: chronic BP monitoring frequency: not checking BP range:not checking BP medication side effects: no Duration of hyperlipidemia: chronic Cholesterol medication side effects: no Cholesterol supplements: fish oil Medication compliance: good compliance Aspirin: yes Recent stressors: no Recurrent headaches: no Visual changes: no Palpitations: no Dyspnea: no Chest pain:  BP 117/70    Pulse 76    Temp 97.6 F (36.4 C)    Ht 5' 5" (1.651 m)    Wt 159 lb (72.1 kg)    SpO2 97%    BMI 26.46 kg/m    Subjective:    Patient ID: Laura Rojas, female    DOB: January 14, 1946, 75 y.o.   MRN: [id number]  HPI: Laura Rojas is a 75 y.o. female  Chief Complaint  Patient presents with   Hypertension   Hyperlipidemia   Diabetes    Patient states she would like to discuss Rakin Lemelle taking over her Diabetes as her Endocrinologist has left [facility].    Mood   Oral Swelling    Patient states she has a oral breakout that comes every once and a while and she is currently having an outbreak and she states she has never tried Benadryl during a breakout and would like to discuss with provider.     SORES IN MOUTH Has had these off and on for a few years, they get on the sides of her mouth and mainly on tongue.  They look blistery in appearance.  Sometimes they look like cuts.  When present they burn and sting + are red in appearance. Episodes will last a few days and then go away.  She uses warm water, peroxide and water -- this helps improve symptoms.  At times they appear when she is under more stress.    DIABETES Followed by endocrinology and last saw NP [facility] 05/16/21 with Basaglar increased to 20 units.  Her last A1c was 8.2% in June.  She continues on Basaglar, Novolog sliding scale, and Metformin.  She reports her readings today will be horrible due to Christmas and Prednisone.  Has not been on Basaglar for awhile as had no heard from endo, her NP left from there and she would like to stay with PCP for now.  She did not tolerate GLP1 in past. Hypoglycemic episodes: a few  Polydipsia/polyuria: no Visual disturbance: no Chest pain: no Paresthesias: no Glucose Monitoring: yes             Accucheck frequency: Daily             Fasting glucose: 100 to 250 range (all over)             Post prandial:             Evening:              Before meals: Taking Insulin?:  yes             Long acting insulin: 20 units (has not taken in 6 months)             Short acting insulin: siding scale units with meals (16-24 units) Blood Pressure Monitoring: rarely Retinal Examination: Up to Date Foot Exam: Up to Date Pneumovax: Up to Date Influenza: Up to Date Aspirin: yes   HYPERTENSION / HYPERLIPIDEMIA/CAD She is followed by cardiology, Dr. Sallyanne Kuster. Last saw 10/19/21.  Cardiology follows due to subclavian artery stenosis and angina. Continues on Amlodipine, Plavix, HCTZ, Fish oil, Losartan, Lipitor.  Satisfied with current treatment? yes Duration of hypertension: chronic BP monitoring frequency: not checking BP range:not checking BP medication side effects: no Duration of hyperlipidemia: chronic Cholesterol medication side effects: no Cholesterol supplements: fish oil Medication compliance: good compliance Aspirin: yes Recent stressors: no Recurrent headaches: no Visual changes: no Palpitations: no Dyspnea: no Chest pain:  BP 117/70    Pulse 76    Temp 97.6 F (36.4 C)    Ht 5' 5" (1.651 m)    Wt 159 lb (72.1 kg)    SpO2 97%    BMI 26.46 kg/m    Subjective:    Patient ID: Laura Rojas, female    DOB: January 14, 1946, 75 y.o.   MRN: [id number]  HPI: Laura Rojas is a 75 y.o. female  Chief Complaint  Patient presents with   Hypertension   Hyperlipidemia   Diabetes    Patient states she would like to discuss Rakin Lemelle taking over her Diabetes as her Endocrinologist has left [facility].    Mood   Oral Swelling    Patient states she has a oral breakout that comes every once and a while and she is currently having an outbreak and she states she has never tried Benadryl during a breakout and would like to discuss with provider.     SORES IN MOUTH Has had these off and on for a few years, they get on the sides of her mouth and mainly on tongue.  They look blistery in appearance.  Sometimes they look like cuts.  When present they burn and sting + are red in appearance. Episodes will last a few days and then go away.  She uses warm water, peroxide and water -- this helps improve symptoms.  At times they appear when she is under more stress.    DIABETES Followed by endocrinology and last saw NP [facility] 05/16/21 with Basaglar increased to 20 units.  Her last A1c was 8.2% in June.  She continues on Basaglar, Novolog sliding scale, and Metformin.  She reports her readings today will be horrible due to Christmas and Prednisone.  Has not been on Basaglar for awhile as had no heard from endo, her NP left from there and she would like to stay with PCP for now.  She did not tolerate GLP1 in past. Hypoglycemic episodes: a few  Polydipsia/polyuria: no Visual disturbance: no Chest pain: no Paresthesias: no Glucose Monitoring: yes             Accucheck frequency: Daily             Fasting glucose: 100 to 250 range (all over)             Post prandial:             Evening:              Before meals: Taking Insulin?:  yes             Long acting insulin: 20 units (has not taken in 6 months)             Short acting insulin: siding scale units with meals (16-24 units) Blood Pressure Monitoring: rarely Retinal Examination: Up to Date Foot Exam: Up to Date Pneumovax: Up to Date Influenza: Up to Date Aspirin: yes   HYPERTENSION / HYPERLIPIDEMIA/CAD She is followed by cardiology, Dr. Sallyanne Kuster. Last saw 10/19/21.  Cardiology follows due to subclavian artery stenosis and angina. Continues on Amlodipine, Plavix, HCTZ, Fish oil, Losartan, Lipitor.  Satisfied with current treatment? yes Duration of hypertension: chronic BP monitoring frequency: not checking BP range:not checking BP medication side effects: no Duration of hyperlipidemia: chronic Cholesterol medication side effects: no Cholesterol supplements: fish oil Medication compliance: good compliance Aspirin: yes Recent stressors: no Recurrent headaches: no Visual changes: no Palpitations: no Dyspnea: no Chest pain:  BP 117/70    Pulse 76    Temp 97.6 F (36.4 C)    Ht 5' 5" (1.651 m)    Wt 159 lb (72.1 kg)    SpO2 97%    BMI 26.46 kg/m    Subjective:    Patient ID: Laura Rojas, female    DOB: January 14, 1946, 75 y.o.   MRN: [id number]  HPI: Laura Rojas is a 75 y.o. female  Chief Complaint  Patient presents with   Hypertension   Hyperlipidemia   Diabetes    Patient states she would like to discuss Rakin Lemelle taking over her Diabetes as her Endocrinologist has left [facility].    Mood   Oral Swelling    Patient states she has a oral breakout that comes every once and a while and she is currently having an outbreak and she states she has never tried Benadryl during a breakout and would like to discuss with provider.     SORES IN MOUTH Has had these off and on for a few years, they get on the sides of her mouth and mainly on tongue.  They look blistery in appearance.  Sometimes they look like cuts.  When present they burn and sting + are red in appearance. Episodes will last a few days and then go away.  She uses warm water, peroxide and water -- this helps improve symptoms.  At times they appear when she is under more stress.    DIABETES Followed by endocrinology and last saw NP [facility] 05/16/21 with Basaglar increased to 20 units.  Her last A1c was 8.2% in June.  She continues on Basaglar, Novolog sliding scale, and Metformin.  She reports her readings today will be horrible due to Christmas and Prednisone.  Has not been on Basaglar for awhile as had no heard from endo, her NP left from there and she would like to stay with PCP for now.  She did not tolerate GLP1 in past. Hypoglycemic episodes: a few  Polydipsia/polyuria: no Visual disturbance: no Chest pain: no Paresthesias: no Glucose Monitoring: yes             Accucheck frequency: Daily             Fasting glucose: 100 to 250 range (all over)             Post prandial:             Evening:              Before meals: Taking Insulin?:  yes             Long acting insulin: 20 units (has not taken in 6 months)             Short acting insulin: siding scale units with meals (16-24 units) Blood Pressure Monitoring: rarely Retinal Examination: Up to Date Foot Exam: Up to Date Pneumovax: Up to Date Influenza: Up to Date Aspirin: yes   HYPERTENSION / HYPERLIPIDEMIA/CAD She is followed by cardiology, Dr. Sallyanne Kuster. Last saw 10/19/21.  Cardiology follows due to subclavian artery stenosis and angina. Continues on Amlodipine, Plavix, HCTZ, Fish oil, Losartan, Lipitor.  Satisfied with current treatment? yes Duration of hypertension: chronic BP monitoring frequency: not checking BP range:not checking BP medication side effects: no Duration of hyperlipidemia: chronic Cholesterol medication side effects: no Cholesterol supplements: fish oil Medication compliance: good compliance Aspirin: yes Recent stressors: no Recurrent headaches: no Visual changes: no Palpitations: no Dyspnea: no Chest pain:  BP 117/70    Pulse 76    Temp 97.6 F (36.4 C)    Ht 5' 5" (1.651 m)    Wt 159 lb (72.1 kg)    SpO2 97%    BMI 26.46 kg/m    Subjective:    Patient ID: Laura Rojas, female    DOB: January 14, 1946, 75 y.o.   MRN: [id number]  HPI: Laura Rojas is a 75 y.o. female  Chief Complaint  Patient presents with   Hypertension   Hyperlipidemia   Diabetes    Patient states she would like to discuss Rakin Lemelle taking over her Diabetes as her Endocrinologist has left [facility].    Mood   Oral Swelling    Patient states she has a oral breakout that comes every once and a while and she is currently having an outbreak and she states she has never tried Benadryl during a breakout and would like to discuss with provider.     SORES IN MOUTH Has had these off and on for a few years, they get on the sides of her mouth and mainly on tongue.  They look blistery in appearance.  Sometimes they look like cuts.  When present they burn and sting + are red in appearance. Episodes will last a few days and then go away.  She uses warm water, peroxide and water -- this helps improve symptoms.  At times they appear when she is under more stress.    DIABETES Followed by endocrinology and last saw NP [facility] 05/16/21 with Basaglar increased to 20 units.  Her last A1c was 8.2% in June.  She continues on Basaglar, Novolog sliding scale, and Metformin.  She reports her readings today will be horrible due to Christmas and Prednisone.  Has not been on Basaglar for awhile as had no heard from endo, her NP left from there and she would like to stay with PCP for now.  She did not tolerate GLP1 in past. Hypoglycemic episodes: a few  Polydipsia/polyuria: no Visual disturbance: no Chest pain: no Paresthesias: no Glucose Monitoring: yes             Accucheck frequency: Daily             Fasting glucose: 100 to 250 range (all over)             Post prandial:             Evening:              Before meals: Taking Insulin?:  yes             Long acting insulin: 20 units (has not taken in 6 months)             Short acting insulin: siding scale units with meals (16-24 units) Blood Pressure Monitoring: rarely Retinal Examination: Up to Date Foot Exam: Up to Date Pneumovax: Up to Date Influenza: Up to Date Aspirin: yes   HYPERTENSION / HYPERLIPIDEMIA/CAD She is followed by cardiology, Dr. Sallyanne Kuster. Last saw 10/19/21.  Cardiology follows due to subclavian artery stenosis and angina. Continues on Amlodipine, Plavix, HCTZ, Fish oil, Losartan, Lipitor.  Satisfied with current treatment? yes Duration of hypertension: chronic BP monitoring frequency: not checking BP range:not checking BP medication side effects: no Duration of hyperlipidemia: chronic Cholesterol medication side effects: no Cholesterol supplements: fish oil Medication compliance: good compliance Aspirin: yes Recent stressors: no Recurrent headaches: no Visual changes: no Palpitations: no Dyspnea: no Chest pain:  BP 117/70    Pulse 76    Temp 97.6 F (36.4 C)    Ht 5' 5" (1.651 m)    Wt 159 lb (72.1 kg)    SpO2 97%    BMI 26.46 kg/m    Subjective:    Patient ID: Laura Rojas, female    DOB: January 14, 1946, 75 y.o.   MRN: [id number]  HPI: Laura Rojas is a 75 y.o. female  Chief Complaint  Patient presents with   Hypertension   Hyperlipidemia   Diabetes    Patient states she would like to discuss Rakin Lemelle taking over her Diabetes as her Endocrinologist has left [facility].    Mood   Oral Swelling    Patient states she has a oral breakout that comes every once and a while and she is currently having an outbreak and she states she has never tried Benadryl during a breakout and would like to discuss with provider.     SORES IN MOUTH Has had these off and on for a few years, they get on the sides of her mouth and mainly on tongue.  They look blistery in appearance.  Sometimes they look like cuts.  When present they burn and sting + are red in appearance. Episodes will last a few days and then go away.  She uses warm water, peroxide and water -- this helps improve symptoms.  At times they appear when she is under more stress.    DIABETES Followed by endocrinology and last saw NP [facility] 05/16/21 with Basaglar increased to 20 units.  Her last A1c was 8.2% in June.  She continues on Basaglar, Novolog sliding scale, and Metformin.  She reports her readings today will be horrible due to Christmas and Prednisone.  Has not been on Basaglar for awhile as had no heard from endo, her NP left from there and she would like to stay with PCP for now.  She did not tolerate GLP1 in past. Hypoglycemic episodes: a few  Polydipsia/polyuria: no Visual disturbance: no Chest pain: no Paresthesias: no Glucose Monitoring: yes             Accucheck frequency: Daily             Fasting glucose: 100 to 250 range (all over)             Post prandial:             Evening:              Before meals: Taking Insulin?:  yes             Long acting insulin: 20 units (has not taken in 6 months)             Short acting insulin: siding scale units with meals (16-24 units) Blood Pressure Monitoring: rarely Retinal Examination: Up to Date Foot Exam: Up to Date Pneumovax: Up to Date Influenza: Up to Date Aspirin: yes   HYPERTENSION / HYPERLIPIDEMIA/CAD She is followed by cardiology, Dr. Sallyanne Kuster. Last saw 10/19/21.  Cardiology follows due to subclavian artery stenosis and angina. Continues on Amlodipine, Plavix, HCTZ, Fish oil, Losartan, Lipitor.  Satisfied with current treatment? yes Duration of hypertension: chronic BP monitoring frequency: not checking BP range:not checking BP medication side effects: no Duration of hyperlipidemia: chronic Cholesterol medication side effects: no Cholesterol supplements: fish oil Medication compliance: good compliance Aspirin: yes Recent stressors: no Recurrent headaches: no Visual changes: no Palpitations: no Dyspnea: no Chest pain:

## 2021-11-26 LAB — COMPREHENSIVE METABOLIC PANEL
ALT: 29 IU/L (ref 0–32)
AST: 24 IU/L (ref 0–40)
Albumin/Globulin Ratio: 1.8 (ref 1.2–2.2)
Albumin: 4.3 g/dL (ref 3.7–4.7)
Alkaline Phosphatase: 217 IU/L — ABNORMAL HIGH (ref 44–121)
BUN/Creatinine Ratio: 17 (ref 12–28)
BUN: 20 mg/dL (ref 8–27)
Bilirubin Total: 0.3 mg/dL (ref 0.0–1.2)
CO2: 23 mmol/L (ref 20–29)
Calcium: 9.6 mg/dL (ref 8.7–10.3)
Chloride: 103 mmol/L (ref 96–106)
Creatinine, Ser: 1.2 mg/dL — ABNORMAL HIGH (ref 0.57–1.00)
Globulin, Total: 2.4 g/dL (ref 1.5–4.5)
Glucose: 290 mg/dL — ABNORMAL HIGH (ref 70–99)
Potassium: 5.6 mmol/L — ABNORMAL HIGH (ref 3.5–5.2)
Sodium: 140 mmol/L (ref 134–144)
Total Protein: 6.7 g/dL (ref 6.0–8.5)
eGFR: 47 mL/min/{1.73_m2} — ABNORMAL LOW (ref >59)

## 2021-11-26 LAB — LIPID PANEL W/O CHOL/HDL RATIO
Cholesterol, Total: 165 mg/dL (ref 100–199)
HDL: 73 mg/dL (ref >39)
LDL Chol Calc (NIH): 64 mg/dL (ref 0–99)
Triglycerides: 172 mg/dL — ABNORMAL HIGH (ref 0–149)
VLDL Cholesterol Cal: 28 mg/dL (ref 5–40)

## 2021-11-26 LAB — CBC WITH DIFFERENTIAL/PLATELET
Basophils Absolute: 0 10*3/uL (ref 0.0–0.2)
Basos: 1 %
EOS (ABSOLUTE): 0 10*3/uL (ref 0.0–0.4)
Eos: 0 %
Hematocrit: 36.5 % (ref 34.0–46.6)
Hemoglobin: 11.6 g/dL (ref 11.1–15.9)
Immature Grans (Abs): 0 10*3/uL (ref 0.0–0.1)
Immature Granulocytes: 0 %
Lymphocytes Absolute: 1.6 10*3/uL (ref 0.7–3.1)
Lymphs: 25 %
MCH: 27.3 pg (ref 26.6–33.0)
MCHC: 31.8 g/dL (ref 31.5–35.7)
MCV: 86 fL (ref 79–97)
Monocytes Absolute: 0.3 10*3/uL (ref 0.1–0.9)
Monocytes: 6 %
Neutrophils Absolute: 4.3 10*3/uL (ref 1.4–7.0)
Neutrophils: 68 %
Platelets: 316 10*3/uL (ref 150–450)
RBC: 4.25 x10E6/uL (ref 3.77–5.28)
RDW: 13.5 % (ref 11.7–15.4)
WBC: 6.2 10*3/uL (ref 3.4–10.8)

## 2021-11-26 LAB — VITAMIN D 25 HYDROXY (VIT D DEFICIENCY, FRACTURES): Vit D, 25-Hydroxy: 62.7 ng/mL (ref 30.0–100.0)

## 2021-11-26 NOTE — Progress Notes (Signed)
Contacted via [place] morning [place], your labs have returned: - Kidney function continues to show some stage 3 kidney disease with no worsening at this time, creatinine and eGFR.  We will continue to monitor closely at visits.  The new medication may help this too.   - Potassium level is a little elevated, we will recheck this at visit in 4 weeks to ensure it does not remain high.  The new medication may help this come down.  Try to reduce foods high in potassium like bananas, potatoes, dried fruit, mangoes. - CBC shows no anemia or infection. - Cholesterol levels show LDL at goal. - Vitamin D is normal.  Any questions? Keep being awesome!!  Thank you for allowing me to participate in your care.  I appreciate you. Kindest regards, Erikah Thumm

## 2021-11-30 DIAGNOSIS — E113392 Type 2 diabetes mellitus with moderate nonproliferative diabetic retinopathy without macular edema, left eye: Secondary | ICD-10-CM | POA: Diagnosis not present

## 2021-11-30 LAB — HM DIABETES EYE EXAM

## 2021-12-05 ENCOUNTER — Telehealth: Payer: Medicare Other

## 2021-12-05 ENCOUNTER — Telehealth: Payer: Self-pay

## 2021-12-05 NOTE — Telephone Encounter (Signed)
°  Care Management   Follow Up Note   12/05/2021 Name: Laura Rojas MRN: [id number] DOB: 24-May-1946   Referred by: Venita Lick, NP Reason for referral : Chronic Care Management (Pharmacist telephone visit)  An unsuccessful telephone outreach was attempted today. The patient was referred to the case management team for assistance with care management and care coordination.   Follow Up Plan:  Care guide notified to RS patient  Madelin Rear, PharmD, [facility] Clinical Pharmacist  [place]  [phone]

## 2021-12-06 ENCOUNTER — Telehealth: Payer: Medicare Other

## 2021-12-10 ENCOUNTER — Other Ambulatory Visit: Payer: Self-pay | Admitting: Cardiovascular Disease

## 2021-12-14 DIAGNOSIS — M79604 Pain in right leg: Secondary | ICD-10-CM | POA: Diagnosis not present

## 2021-12-14 DIAGNOSIS — M7138 Other bursal cyst, other site: Secondary | ICD-10-CM | POA: Diagnosis not present

## 2021-12-14 DIAGNOSIS — M545 Low back pain, unspecified: Secondary | ICD-10-CM | POA: Diagnosis not present

## 2021-12-14 DIAGNOSIS — M47896 Other spondylosis, lumbar region: Secondary | ICD-10-CM | POA: Diagnosis not present

## 2021-12-20 DIAGNOSIS — M0609 Rheumatoid arthritis without rheumatoid factor, multiple sites: Secondary | ICD-10-CM | POA: Diagnosis not present

## 2021-12-23 ENCOUNTER — Other Ambulatory Visit: Payer: Self-pay

## 2021-12-23 ENCOUNTER — Encounter: Payer: Self-pay | Admitting: Nurse Practitioner

## 2021-12-23 ENCOUNTER — Ambulatory Visit (INDEPENDENT_AMBULATORY_CARE_PROVIDER_SITE_OTHER): Payer: Medicare Other | Admitting: Nurse Practitioner

## 2021-12-23 VITALS — BP 101/66 | HR 68 | Temp 98.2°F | Ht 65.0 in | Wt 158.6 lb

## 2021-12-23 DIAGNOSIS — N183 Chronic kidney disease, stage 3 unspecified: Secondary | ICD-10-CM | POA: Diagnosis not present

## 2021-12-23 DIAGNOSIS — E1165 Type 2 diabetes mellitus with hyperglycemia: Secondary | ICD-10-CM

## 2021-12-23 DIAGNOSIS — E1122 Type 2 diabetes mellitus with diabetic chronic kidney disease: Secondary | ICD-10-CM

## 2021-12-23 DIAGNOSIS — Z794 Long term (current) use of insulin: Secondary | ICD-10-CM | POA: Diagnosis not present

## 2021-12-23 DIAGNOSIS — R21 Rash and other nonspecific skin eruption: Secondary | ICD-10-CM | POA: Insufficient documentation

## 2021-12-23 MED ORDER — TRIAMCINOLONE ACETONIDE 0.1 % EX CREA: 1.0000 "application " | TOPICAL_CREAM | Freq: Two times a day (BID) | CUTANEOUS | 0 refills | Status: DC

## 2021-12-23 NOTE — Assessment & Plan Note (Signed)
To bilateral upper extremity -- ?related to injections for her PR.  Recommend she avoid Benadryl due to BEERS criteria and risk for urinary retention when we just added on Farxiga.  Take Zyrtec as needed and will send in Triamcinolone cream + recommend OTC Sarna lotion to use as needed.  She will reach out further to rheumatology on this.

## 2021-12-23 NOTE — Progress Notes (Signed)
BP 101/66    Pulse 68    Temp 98.2 F (36.8 C) (Oral)    Ht 5' 5"  (1.651 m)    Wt 158 lb 9.6 oz (71.9 kg)    SpO2 95%    BMI 26.39 kg/m    Subjective:    Patient ID: Laura Rojas, female    DOB: [birth year], 76 y.o.   MRN: [id number]  HPI: Laura Rojas is a 76 y.o. female  Chief Complaint  Patient presents with   Diabetes    Patient recent Diabetic Eye Exam requested at today's visit.    Medication Management   Rash    Patient states she is having itching all over her body from what she thinks may be her Arthritis shots. Patient was told to take Benadryl and Zyrtec.    DIABETES Followed by endocrinology and last saw NP [facility] 05/16/21 with Basaglar increased to 20 units.  Her last A1c was 8.4% in December -- at time recommended she restart Basaglar.  Had not been on Basaglar for awhile as had no heard from endo, her NP left from there and she would like to stay with PCP for now.  At visit on 11/25/21 start [place] 10 MG for heart and kidney health.  She continues on Basaglar, Novolog sliding scale, and Metformin.  Potassium on recent labs was mildly elevated.  She reports she is tolerating [place] well and blood sugars are improving.   She did not tolerate GLP1 in past. Hypoglycemic episodes: a few  Polydipsia/polyuria: no Visual disturbance: no Chest pain: no Paresthesias: no Glucose Monitoring: yes             Accucheck frequency: Daily  ranging 150-160 throughout day             Fasting glucose:              Post prandial:             Evening:              Before meals: Taking Insulin?: yes             Long acting insulin: 10 units              Short acting insulin: siding scale units with meals (16-24 units) Blood Pressure Monitoring: rarely Retinal Examination: Up to Date Foot Exam: Up to Date Pneumovax: Up to Date Influenza: Up to Date Aspirin: yes   RASH Rheumatology has her on injections for her PR-- Cimzia. Her rheumatologist is aware and told her to take  Benadryl and Zyrtec.   Duration:  months  [facility] -- [facility] is [facility] she gets injections Itching: yes Burning: no Redness: yes Oozing: no Scaling: yes Blisters: no Painful: no Fevers: no Change in detergents/soaps/personal care products: no Recent illness: no Recent travel:no History of same: no Context: fluctuating Alleviating factors: benadryl Treatments attempted:benadryl Shortness of breath: no  Throat/tongue swelling: no Myalgias/arthralgias: no   Relevant past medical, surgical, family and social history reviewed and updated as indicated. Interim medical history since our last visit reviewed. Allergies and medications reviewed and updated.  Review of Systems  Per HPI unless specifically indicated above     Objective:    BP 101/66    Pulse 68    Temp 98.2 F (36.8 C) (Oral)    Ht 5' 5"  (1.651 m)    Wt 158 lb 9.6 oz (71.9 kg)    SpO2 95%    BMI 26.39 kg/m  Wt Readings from Last 3 Encounters:  12/23/21 158 lb 9.6 oz (71.9 kg)  11/25/21 159 lb (72.1 kg)  10/19/21 159 lb 6.4 oz (72.3 kg)    Physical Exam Vitals and nursing note reviewed.  Constitutional:      General: She is awake. She is not in acute distress.    Appearance: She is well-developed, well-groomed and overweight. She is not ill-appearing.  HENT:     Head: Normocephalic.     Right Ear: Hearing normal.     Left Ear: Hearing normal.  Eyes:     General: Lids are normal.        Right eye: No discharge.        Left eye: No discharge.     Conjunctiva/sclera: Conjunctivae normal.     Pupils: Pupils are equal, round, and reactive to light.  Neck:     Thyroid: No thyromegaly.     Vascular: Carotid bruit (bilateral) present.  Cardiovascular:     Rate and Rhythm: Normal rate and regular rhythm.     Heart sounds: Normal heart sounds. No murmur heard.   No gallop.  Pulmonary:     Effort: Pulmonary effort is normal. No accessory muscle usage or respiratory distress.     Breath sounds: No wheezing  or rhonchi.  Abdominal:     General: Bowel sounds are normal.     Palpations: Abdomen is soft.  Musculoskeletal:     Cervical back: Normal range of motion and neck supple.     Right lower leg: No edema.     Left lower leg: No edema.  Skin:    General: Skin is warm and dry.     Findings: Rash present.     Comments: Scattered small, flat scaly red spots noted to bilateral upper extremities with linear scratch marks present around them.  Neurological:     Mental Status: She is alert and oriented to person, place, and time.  Psychiatric:        Attention and Perception: Attention normal.        Mood and Affect: Mood normal.        Speech: Speech normal.        Behavior: Behavior normal. Behavior is cooperative.        Thought Content: Thought content normal.    Results for orders placed or performed in visit on 11/25/21  Microalbumin, Urine Waived  Result Value Ref Range   Microalb, Ur Waived 30 (H) 0 - 19 mg/L   Creatinine, Urine Waived 200 10 - 300 mg/dL   Microalb/Creat Ratio <30 <30 mg/g  Comprehensive metabolic panel  Result Value Ref Range   Glucose 290 (H) 70 - 99 mg/dL   BUN 20 8 - 27 mg/dL   Creatinine, Ser 1.20 (H) 0.57 - 1.00 mg/dL   eGFR 47 (L) >59 mL/min/1.73   BUN/Creatinine Ratio 17 12 - 28   Sodium 140 134 - 144 mmol/L   Potassium 5.6 (H) 3.5 - 5.2 mmol/L   Chloride 103 96 - 106 mmol/L   CO2 23 20 - 29 mmol/L   Calcium 9.6 8.7 - 10.3 mg/dL   Total Protein 6.7 6.0 - 8.5 g/dL   Albumin 4.3 3.7 - 4.7 g/dL   Globulin, Total 2.4 1.5 - 4.5 g/dL   Albumin/Globulin Ratio 1.8 1.2 - 2.2   Bilirubin Total 0.3 0.0 - 1.2 mg/dL   Alkaline Phosphatase 217 (H) 44 - 121 IU/L   AST 24 0 - 40 IU/L   ALT  29 0 - 32 IU/L  CBC with Differential/Platelet  Result Value Ref Range   WBC 6.2 3.4 - 10.8 x10E3/uL   RBC 4.25 3.77 - 5.28 x10E6/uL   Hemoglobin 11.6 11.1 - 15.9 g/dL   Hematocrit 36.5 34.0 - 46.6 %   MCV 86 79 - 97 fL   MCH 27.3 26.6 - 33.0 pg   MCHC 31.8 31.5 - 35.7  g/dL   RDW 13.5 11.7 - 15.4 %   Platelets 316 150 - 450 x10E3/uL   Neutrophils 68 Not Estab. %   Lymphs 25 Not Estab. %   Monocytes 6 Not Estab. %   Eos 0 Not Estab. %   Basos 1 Not Estab. %   Neutrophils Absolute 4.3 1.4 - 7.0 x10E3/uL   Lymphocytes Absolute 1.6 0.7 - 3.1 x10E3/uL   Monocytes Absolute 0.3 0.1 - 0.9 x10E3/uL   EOS (ABSOLUTE) 0.0 0.0 - 0.4 x10E3/uL   Basophils Absolute 0.0 0.0 - 0.2 x10E3/uL   Immature Granulocytes 0 Not Estab. %   Immature Grans (Abs) 0.0 0.0 - 0.1 x10E3/uL  Lipid Panel w/o Chol/HDL Ratio  Result Value Ref Range   Cholesterol, Total 165 100 - 199 mg/dL   Triglycerides 172 (H) 0 - 149 mg/dL   HDL 73 >39 mg/dL   VLDL Cholesterol Cal 28 5 - 40 mg/dL   LDL Chol Calc (NIH) 64 0 - 99 mg/dL  VITAMIN D 25 Hydroxy (Vit-D Deficiency, Fractures)  Result Value Ref Range   Vit D, 25-Hydroxy 62.7 30.0 - 100.0 ng/mL  Bayer DCA Hb A1c Waived  Result Value Ref Range   HB A1C (BAYER DCA - WAIVED) 8.4 (H) 4.8 - 5.6 %      Assessment & Plan:   Problem List Items Addressed This Visit       Endocrine   CKD stage 3 due to type 2 diabetes mellitus (HCC)    Chronic, stable.  Continue Losartan for kidney protection.  Urine micro in office February 2022 =  ALB 30 and A:C <30 -- recheck next visit. Continue collaboration with cardiology and endocrinology.  Consider nephrology referral if worsening function.  BMP today.      Relevant Orders   Basic metabolic panel   Type 2 diabetes mellitus with hyperglycemia, with long-term current use of insulin (HCC) - Primary    Chronic, ongoing, followed by endocrinology with A1c 8.4% four weeks ago with PCP.  She wishes to avoid return to endo at this time as her previous provider left.  Will continue Farxiga 10 MG daily for diabetes and kidney + heart protection (is tolerating) + recommend she continue Basaglar at 10 units + cut back on her sliding scale with this, continue Metformin.  Continue collaboration with CCM team in  office.   Bring Libre to next visit.  Return to office in 2 months.  Recheck BMP today.      Relevant Orders   Basic metabolic panel     Musculoskeletal and Integument   Rash    To bilateral upper extremity -- ?related to injections for her PR.  Recommend she avoid Benadryl due to BEERS criteria and risk for urinary retention when we just added on Farxiga.  Take Zyrtec as needed and will send in Triamcinolone cream + recommend OTC Sarna lotion to use as needed.  She will reach out further to rheumatology on this.        Follow up plan: Return in about 2 months (around 02/20/2022) for T2DM, HTN/HLD, MOOD, CKD,  RLS, ANEMIA.

## 2021-12-23 NOTE — Patient Instructions (Signed)
Rash, Adult  A rash is a change in the color of your skin. A rash can also change the way your skin feels. There are many different conditions and factors that can causea rash. Follow these instructions at home: The goal of treatment is to stop the itching and keep the rash from spreading. Watch for any changes in your symptoms. Let your doctor know about them. Followthese instructions to help with your condition: Medicine Take or apply over-the-counter and prescription medicines only as told by your doctor. These may include medicines: To treat red or swollen skin (corticosteroid creams). To treat itching. To treat an allergy (oral antihistamines). To treat very bad symptoms (oral corticosteroids).  Skin care Put cool cloths (compresses) on the affected areas. Do not scratch or rub your skin. Avoid covering the rash. Make sure that the rash is exposed to air as much as possible. Managing itching and discomfort Avoid hot showers or baths. These can make itching worse. A cold shower may help. Try taking a bath with: Epsom salts. You can get these at your local pharmacy or grocery store. Follow the instructions on the package. Baking soda. Pour a small amount into the bath as told by your doctor. Colloidal oatmeal. You can get this at your local pharmacy or grocery store. Follow the instructions on the package. Try putting baking soda paste onto your skin. Stir water into baking soda until it gets like a paste. Try putting on a lotion that relieves itchiness (calamine lotion). Keep cool and out of the sun. Sweating and being hot can make itching worse. General instructions  Rest as needed. Drink enough fluid to keep your pee (urine) pale yellow. Wear loose-fitting clothing. Avoid scented soaps, detergents, and perfumes. Use gentle soaps, detergents, perfumes, and other cosmetic products. Avoid anything that causes your rash. Keep a journal to help track what causes your rash. Write  down: What you eat. What cosmetic products you use. What you drink. What you wear. This includes jewelry. Keep all follow-up visits as told by your doctor. This is important.  Contact a doctor if: You sweat at night. You lose weight. You pee (urinate) more than normal. You pee less than normal, or you notice that your pee is a darker color than normal. You feel weak. You throw up (vomit). Your skin or the whites of your eyes look yellow (jaundice). Your skin: Tingles. Is numb. Your rash: Does not go away after a few days. Gets worse. You are: More thirsty than normal. More tired than normal. You have: New symptoms. Pain in your belly (abdomen). A fever. Watery poop (diarrhea). Get help right away if: You have a fever and your symptoms suddenly get worse. You start to feel mixed up (confused). You have a very bad headache or a stiff neck. You have very bad joint pains or stiffness. You have jerky movements that you cannot control (seizure). Your rash covers all or most of your body. The rash may or may not be painful. You have blisters that: Are on top of the rash. Grow larger. Grow together. Are painful. Are inside your nose or mouth. You have a rash that: Looks like purple pinprick-sized spots all over your body. Has a "bull's eye" or looks like a target. Is red and painful, causes your skin to peel, and is not from being in the sun too long. Summary A rash is a change in the color of your skin. A rash can also change the way your skin feels.   The goal of treatment is to stop the itching and keep the rash from spreading. Take or apply over-the-counter and prescription medicines only as told by your doctor. Contact a doctor if you have new symptoms or symptoms that get worse. Keep all follow-up visits as told by your doctor. This is important. This information is not intended to replace advice given to you by your health care provider. Make sure you discuss any  questions you have with your healthcare provider. Document Revised: 03/07/2019 Document Reviewed: 06/17/2018 Elsevier Patient Education  2022 [employer]  

## 2021-12-23 NOTE — Assessment & Plan Note (Signed)
Chronic, ongoing, followed by endocrinology with A1c 8.4% four weeks ago with PCP.  She wishes to avoid return to endo at this time as her previous provider left.  Will continue Farxiga 10 MG daily for diabetes and kidney + heart protection (is tolerating) + recommend she continue Basaglar at 10 units + cut back on her sliding scale with this, continue Metformin.  Continue collaboration with CCM team in office.   Bring Libre to next visit.  Return to office in 2 months.  Recheck BMP today.

## 2021-12-23 NOTE — Assessment & Plan Note (Signed)
Chronic, stable.  Continue Losartan for kidney protection.  Urine micro in office February 2022 =  ALB 30 and A:C <30 -- recheck next visit. Continue collaboration with cardiology and endocrinology.  Consider nephrology referral if worsening function.  BMP today.

## 2021-12-24 LAB — BASIC METABOLIC PANEL
BUN/Creatinine Ratio: 20 (ref 12–28)
BUN: 25 mg/dL (ref 8–27)
CO2: 24 mmol/L (ref 20–29)
Calcium: 9.8 mg/dL (ref 8.7–10.3)
Chloride: 105 mmol/L (ref 96–106)
Creatinine, Ser: 1.22 mg/dL — ABNORMAL HIGH (ref 0.57–1.00)
Glucose: 125 mg/dL — ABNORMAL HIGH (ref 70–99)
Potassium: 4.4 mmol/L (ref 3.5–5.2)
Sodium: 144 mmol/L (ref 134–144)
eGFR: 46 mL/min/{1.73_m2} — ABNORMAL LOW (ref >59)

## 2021-12-24 NOTE — Progress Notes (Signed)
Contacted via [facility], your labs have returned and potassium level is now improved.  Kidney function continues to show some ongoing kidney disease, but this is remaining stable.  No changes needed.  Continue Farxiga.  Any questions? Keep being awesome!!  Thank you for allowing me to participate in your care.  I appreciate you. Kindest regards, Keylie Beavers

## 2021-12-28 ENCOUNTER — Ambulatory Visit: Payer: Medicare Other | Admitting: Dermatology

## 2022-01-02 ENCOUNTER — Ambulatory Visit (INDEPENDENT_AMBULATORY_CARE_PROVIDER_SITE_OTHER): Payer: Medicare Other

## 2022-01-02 DIAGNOSIS — Z794 Long term (current) use of insulin: Secondary | ICD-10-CM

## 2022-01-02 DIAGNOSIS — E785 Hyperlipidemia, unspecified: Secondary | ICD-10-CM

## 2022-01-02 DIAGNOSIS — E1165 Type 2 diabetes mellitus with hyperglycemia: Secondary | ICD-10-CM

## 2022-01-02 DIAGNOSIS — E1169 Type 2 diabetes mellitus with other specified complication: Secondary | ICD-10-CM

## 2022-01-02 NOTE — Progress Notes (Signed)
Chronic Care Management Pharmacy Note  01/10/2022 Name:  Laura Rojas MRN:  [id number] DOB:  1946-05-12  Summary: Denies any cost issues with Basaglar, Wilder Glade, Novolog.  Subjective: Laura Rojas is an 76 y.o. year old female who is a primary patient of Cannady, Barbaraann Faster, NP.  The CCM team was consulted for assistance with disease management and care coordination needs.    Engaged with patient by telephone for follow up visit in response to provider referral for pharmacy case management and/or care coordination services.   Consent to Services:  The patient was given information about Chronic Care Management services, agreed to services, and gave verbal consent prior to initiation of services.  Please see initial visit note for detailed documentation.   Patient Care Team: Venita Lick, NP as PCP - General (Nurse Practitioner) Sanda Klein, MD as PCP - Cardiology (Cardiology) Marlowe Sax, MD as Referring Physician (Internal Medicine) Warnell Forester, NP (Inactive) as Nurse Practitioner (Surgery) Madelin Rear, [facility] as Pharmacist (Pharmacist)  Hospital visits: None in previous 6 months  Objective:  Lab Results  Component Value Date   CREATININE 1.22 (H) 12/23/2021   CREATININE 1.20 (H) 11/25/2021   CREATININE 1.12 (H) 08/02/2021    Lab Results  Component Value Date   HGBA1C 8.4 (H) 11/25/2021   HGBA1C 8.2 (H) 08/02/2021   HGBA1C 8.2 (H) 04/29/2021   Last diabetic Eye exam:  Lab Results  Component Value Date/Time   HMDIABEYEEXA Retinopathy (A) 11/30/2021 12:00 AM    Last diabetic Foot exam:  Lab Results  Component Value Date/Time   HMDIABFOOTEX Normal 05/07/2018 12:00 AM        Component Value Date/Time   CHOL 165 11/25/2021 0843   CHOL 150 08/02/2021 1348   CHOL 165 01/11/2021 0920   CHOL 170 05/08/2019 0939   CHOL 157 01/23/2017 1057   CHOL 147 12/01/2015 0903   TRIG 172 (H) 11/25/2021 0843   TRIG 201 (H) 08/02/2021 1348   TRIG  181 (H) 01/11/2021 0920   TRIG 367 (H) 05/08/2019 0939   TRIG 252 (H) 01/23/2017 1057   TRIG 179 (H) 12/01/2015 0903   HDL 73 11/25/2021 0843   HDL 62 08/02/2021 1348   HDL 58 01/11/2021 0920   CHOLHDL 2.9 08/28/2018 1054   VLDL 73 (H) 05/08/2019 0939   LDLCALC 64 11/25/2021 0843   LDLCALC 56 08/02/2021 1348   LDLCALC 77 01/11/2021 0920    Hepatic Function Latest Ref Rng & Units 11/25/2021 01/11/2021 12/17/2019  Total Protein 6.0 - 8.5 g/dL 6.7 6.6 6.4  Albumin 3.7 - 4.7 g/dL 4.3 4.4 4.7  AST 0 - 40 IU/L _0 ALT 0 - 32 IU/L _1 Alk Phosphatase 44 - 121 IU/L 217(H) 146(H) 150(H)  Total Bilirubin 0.0 - 1.2 mg/dL 0.3 0.3 0.3    Lab Results  Component Value Date/Time   TSH 2.660 07/07/2020 02:02 PM   TSH 2.990 12/17/2019 01:26 PM    CBC Latest Ref Rng & Units 11/25/2021 08/02/2021 01/11/2021  WBC 3.4 - 10.8 x10E3/uL 6.2 13.7(H) 6.0  Hemoglobin 11.1 - 15.9 g/dL 11.6 11.4 11.6  Hematocrit 34.0 - 46.6 % 36.5 35.4 35.4  Platelets 150 - 450 x10E3/uL 316 487(H) 239    Lab Results  Component Value Date/Time   VD25OH 62.7 11/25/2021 08:43 AM   VD25OH 32.0 12/17/2019 01:26 PM    Clinical ASCVD:  The 10-year ASCVD risk score (Arnett DK, et al., 2019) is: 31.6%   Values  used to calculate the score:     Age: 38 years     Sex: Female     Is Non-Hispanic African American: No     Diabetic: Yes     Tobacco smoker: No     Systolic Blood Pressure: 263 mmHg     Is BP treated: Yes     HDL Cholesterol: 73 mg/dL     Total Cholesterol: 165 mg/dL   Social History   Tobacco Use  Smoking Status Never  Smokeless Tobacco Never   BP Readings from Last 3 Encounters:  12/23/21 101/66  11/25/21 117/70  10/19/21 (!) 142/78   Pulse Readings from Last 3 Encounters:  12/23/21 68  11/25/21 76  10/19/21 83   Wt Readings from Last 3 Encounters:  12/23/21 158 lb 9.6 oz (71.9 kg)  11/25/21 159 lb (72.1 kg)  10/19/21 159 lb 6.4 oz (72.3 kg)   BMI Readings from Last 3 Encounters:   12/23/21 26.39 kg/m  11/25/21 26.46 kg/m  10/19/21 26.53 kg/m    Assessment: Review of patient past medical history, allergies, medications, health status, including review of consultants reports, laboratory and other test data, was performed as part of comprehensive evaluation and provision of chronic care management services.   SDOH:  (Social Determinants of Health) assessments and interventions performed: Yes   CCM Care Plan  Allergies  Allergen Reactions   Compazine [Prochlorperazine Edisylate] Other (See Comments)    choking   Altace [Ramipril]    Crestor [Rosuvastatin Calcium] Other (See Comments)    Myalgias   Reglan [Metoclopramide]    Sulfa Antibiotics Other (See Comments)    headache   Actos [Pioglitazone] Other (See Comments)    Hot Flashes   Amlodipine Swelling   Latex Rash    (Bandaids only )   Lipitor [Atorvastatin] Other (See Comments)    Cramps    Medications Reviewed Today     Reviewed by Venita Lick, NP (Nurse Practitioner) on 12/23/21 at 1421  Med List Status: <None>   Medication Order Taking? Sig Documenting Provider Last Dose Status Informant  albuterol (VENTOLIN HFA) 108 (90 Base) MCG/ACT inhaler [id number] Yes TAKE 2 PUFFS BY MOUTH EVERY 6 HOURS AS NEEDED FOR WHEEZE OR SHORTNESS OF BREATH Cannady, Jolene T, NP Taking Active   amLODipine (NORVASC) 2.5 MG tablet [id number] Yes TAKE 1 TABLET BY MOUTH EVERY DAY Croitoru, Mihai, MD Taking Active   aspirin 81 MG tablet [id number] Yes Take 81 mg by mouth daily. [provider] Taking Active Self  atorvastatin (LIPITOR) 40 MG tablet [id number] Yes TAKE 1 TABLET BY MOUTH EVERY DAY** STOP THE SIMVASTATIN Croitoru, Mihai, MD Taking Active   BD PEN NEEDLE NANO U/F 32G X 4 MM MISC [id number] Yes SMARTSIG:1 Each SUB-Q 4 Times Daily [provider] Taking Active   cholecalciferol (VITAMIN D3) 25 MCG (1000 UT) tablet [id number] Yes Take 1,000 Units by mouth daily. [provider] Taking  Active   clopidogrel (PLAVIX) 75 MG tablet [id number] Yes Take 1 tablet (75 mg total) by mouth daily. Venita Lick, NP Taking Active   Continuous Blood Gluc Sensor (FREESTYLE LIBRE 14 DAY SENSOR) [place] [id number] Yes Use 1 kit every 14 (fourteen) days E11.65 [provider] Taking Active   dapagliflozin propanediol (FARXIGA) 10 MG TABS tablet [id number] Yes Take 1 tablet (10 mg total) by mouth daily before breakfast. [name] T, NP Taking Active   folic acid (FOLVITE) 1 MG tablet [id number] Yes folic acid 1 mg tablet  TAKE 1  TABLET BY MOUTH EVERY DAY [provider] Taking Active   Gabapentin, Once-Daily, 300 MG TABS [id number] Yes Take 300 mg by mouth at bedtime. [name] T, NP Taking Active   hydrochlorothiazide (MICROZIDE) 12.5 MG capsule [id number] Yes TAKE 1 CAPSULE BY MOUTH EVERY DAY Cannady, Jolene T, NP Taking Active   Insulin Glargine (BASAGLAR KWIKPEN) 100 UNIT/ML [id number] Yes Inject 10 Units into the skin at bedtime. [name] T, NP Taking Active   Insulin Pen Needle (BD PEN NEEDLE NANO U/F) 32G X 4 MM MISC [id number] Yes Use 1 each 4 (four) times daily [provider] Taking Active   ipratropium-albuterol (DUONEB) 0.5-2.5 (3) MG/3ML nebulizer solution 3 mL [id number]   [name] T, NP  Active   LORazepam (ATIVAN) 0.5 MG tablet [id number] Yes TAKE 1 TAB BY MOUTH TWICE A DAY (USE THE ADDITIONAL 30 TABS ONLY SPARINGLY & ONLY FOR SEVERE ANXIETY) [name] T, NP Taking Active   losartan (COZAAR) 100 MG tablet [id number] Yes TAKE 1 TABLET BY MOUTH EVERY DAY Cannady, Jolene T, NP Taking Active   Magnesium 400 MG TABS [id number] Yes Take 1 tablet by mouth daily. [provider] Taking Active   metFORMIN (GLUCOPHAGE) 1000 MG tablet [id number] Yes Take 500 mg by mouth 2 (two) times daily. [provider] Taking Active   Multiple Vitamin (MULTIVITAMIN) tablet [id number] Yes Take 1 tablet by mouth daily. [provider]  Taking Active   neomycin-polymyxin b-dexamethasone (MAXITROL) 3.5-10000-0.1 SUSP [id number] Yes Place 1 drop into the left eye 3 (three) times daily. [provider] Taking Active   NOVOLOG 100 UNIT/ML injection [id number] Yes Inject 10 Units into the skin 3 (three) times daily. [provider] Taking Active            Med Note Kenton Kingfisher, JULIE S   Thu Mar 03, 2021  9:29 AM) 20 units + sliding scale Take 2 units of NovoLog if your sugar is 150-200 Take 4 units of NovoLog if your sugar is 201-250 Take 6 units of NovoLog if your sugar is 251-300 Take 8 units of NovoLog if your sugar is 301-350 Take 10 units of NovoLog if your sugar is 351-400 Take 12 units of NovoLog if your sugar is over 401     nystatin cream (MYCOSTATIN) [id number] Yes Apply 1 application topically 2 (two) times daily. Johnson, Megan P, DO Taking Active   Omega-3 Fatty Acids (FISH OIL) 1200 MG CAPS [id number] Yes Take 1,200 mg by mouth daily. [provider] Taking Active Self  pantoprazole (PROTONIX) 40 MG tablet [id number] Yes TAKE 1 TABLET BY MOUTH EVERY DAY Cannady, Jolene T, NP Taking Active   sertraline (ZOLOFT) 100 MG tablet [id number] Yes TAKE 1.5 TABLETS BY MOUTH DAILY Cannady, Jolene T, NP Taking Active   triamcinolone cream (KENALOG) 0.1 % [id number] Yes Apply 1 application topically 2 (two) times daily. [name] T, NP  Active   valACYclovir (VALTREX) 1000 MG tablet [id number] Yes Take 1,000 mg by mouth daily. [provider] Taking Active   vitamin B-12 (CYANOCOBALAMIN) 1000 MCG tablet [id number] Yes Take 1,000 mcg by mouth daily. [provider] Taking Active             Patient Active Problem List   Diagnosis Date Noted   Rash 12/23/2021   Canker sores oral 11/25/2021   History of 2019 novel coronavirus disease (COVID-19) 07/29/2021   B12 deficiency 01/11/2021   Long-term current use of benzodiazepine 03/17/2020   Gastroesophageal reflux disease without  esophagitis  12/17/2019   Type 2 diabetes mellitus with hyperglycemia, with long-term current use of insulin ([place]) 11/09/2019   Polyp of transverse colon    Coronary artery disease of bypass graft of native heart with stable angina pectoris ([place]) 07/17/2019   Depression, recurrent ([place]) 10/15/2018   Polymyalgia rheumatica ([place]) 05/15/2018   Restless leg syndrome 10/16/2017   Advanced care planning/counseling discussion 08/23/2017   Insomnia 05/24/2017   CKD stage 3 due to type 2 diabetes mellitus ([place]) 04/13/2016   Fe deficiency anemia 12/01/2015   Anxiety disorder 05/26/2015   Subclavian artery stenosis, left ([place]) 01/01/2015   Hypertension associated with diabetes ([place]) 12/31/2013   Hyperlipidemia associated with type 2 diabetes mellitus ([place]) 12/31/2013    Immunization History  Administered Date(s) Administered   Fluad Quad(high Dose 65+) 09/14/2021   Influenza, High Dose Seasonal PF 09/04/2016, 08/23/2017, 08/28/2018, 07/14/2019, 09/03/2020   Influenza,inj,Quad PF,6+ Mos 09/27/2015   Influenza-Unspecified 09/18/2014   PFIZER(Purple Top)SARS-COV-2 Vaccination 12/04/2019, 12/25/2019, 09/01/2020   Pneumococcal Conjugate-13 04/22/2014   Pneumococcal Polysaccharide-23 11/12/2008, 07/05/2016   Td 12/21/2009, 03/17/2020   Zoster Recombinat (Shingrix) 07/14/2019, 10/15/2019    Conditions to be addressed/monitored: HLD HTN DMII  Care Plan : ccm pharmacy care plan  Updates made by Madelin Rear, [facility] since 01/10/2022 12:00 AM     Problem: HLD HTN DM      Long-Range Goal: disease state management   This Visit's Progress: On track  Priority: High  Note:   Current Barriers:  Unable to maintain control of DM and HLD  Pharmacist Clinical Goal(s):  Patient will verbalize ability to afford treatment regimen through collaboration with PharmD and provider.   Interventions: 1:1 collaboration with Venita Lick, NP regarding development and update of comprehensive plan of care as  evidenced by provider attestation and co-signature Inter-disciplinary care team collaboration (see longitudinal plan of care) Comprehensive medication review performed; medication list updated in electronic medical record  Hyperlipidemia: (LDL goal < 55) -Uncontrolled -Not at goal - 50% LDL reduction and LDL <50 not achieved  -Current treatment: Atorvastatin 40 mg once daily  -Medications previously tried: crestor-myalgias,  -Current dietary patterns: see DM -Current exercise habits: see DM -Educated on Cholesterol goals;  -Counseled on diet and exercise extensively Could consider ezetimibe 10 mg addition to achieve goal    Diabetes (A1c goal <7%) -Controlled -Current medications: Farxiga 10 mg once daily (New Start 10/2021) Lantus 10 units QHS Novolog sliding scale Metformin 500 mg BID -Medications previously tried: Trulicity (GI effects - does not recall specifically); hot flashes on actos -Counseled on metformin administration - not always taking with meals -Current home glucose readings: 180s on avg per cgm. Equates to a1c ~7.9-8.2 -Denies hypoglycemic/hyperglycemic symptoms -Current meal patterns: understanding of DM diet is good, trying best to get in a better balance here -Current exercise: no exercise; used to use the treadmill -Educated on A1c and blood sugar goals; Exercise goal of 150 minutes per week; Continuous glucose monitoring; -Diet/exercise recommendations reviewed in detail -Counseled to check feet daily and get yearly eye exams Could re-challenge GLP1 and/or start GLP1-GIP  -Assessed patient finances. No current problems.  Patient Goals/Self-Care Activities Patient will:  - take medications as prescribed as evidenced by patient report and record review  Medication Assistance: None required.  Patient affirms current coverage meets needs.  Patient's preferred pharmacy is:  [id number] [place] [place] [address]Phone: [phone]: [phone]  Pt endorses 100% compliance  Follow Up:  Patient agrees to Care Plan and Follow-up. Plan: HC 1 month DM call - ask for any updates on diet/exercise. Pharmacist 3 month tel.      Future Appointments  Date Time Provider [place]  02/21/2022  2:00 PM Venita Lick, NP [facility]  04/10/2022  1:00 PM CFP CCM PHARMACY [facility]FP[facility]  08/31/2022  2:45 PM Ralene Bathe, MD ASC-[facility] None  09/04/2022  8:15 AM CFP NURSE HEALTH ADVISOR [facility]FP[facility]FP [facility]    Madelin Rear, PharmD, [facility] Clinical Pharmacist  [facility]  [phone]

## 2022-01-03 DIAGNOSIS — M159 Polyosteoarthritis, unspecified: Secondary | ICD-10-CM | POA: Diagnosis not present

## 2022-01-03 DIAGNOSIS — L282 Other prurigo: Secondary | ICD-10-CM | POA: Diagnosis not present

## 2022-01-03 DIAGNOSIS — M0609 Rheumatoid arthritis without rheumatoid factor, multiple sites: Secondary | ICD-10-CM | POA: Diagnosis not present

## 2022-01-03 DIAGNOSIS — Z79899 Other long term (current) drug therapy: Secondary | ICD-10-CM | POA: Diagnosis not present

## 2022-01-10 NOTE — Patient Instructions (Addendum)
Ms. Fischman,  Thank you for talking with me today. I have included our care plan/goals in the following pages.   Please review and call me at [phone] with any questions.  Thanks! Ellin Mayhew, PharmD Clinical Pharmacist  [phone]  Care Plan : ccm pharmacy care plan  Updates made by Madelin Rear, [facility] since 01/10/2022 12:00 AM     Problem: HLD HTN DM      Long-Range Goal: disease state management   This Visit's Progress: On track  Priority: High  Note:   Current Barriers:  Unable to maintain control of DM and HLD  Pharmacist Clinical Goal(s):  Patient will verbalize ability to afford treatment regimen through collaboration with PharmD and provider.   Interventions: 1:1 collaboration with Venita Lick, NP regarding development and update of comprehensive plan of care as evidenced by provider attestation and co-signature Inter-disciplinary care team collaboration (see longitudinal plan of care) Comprehensive medication review performed; medication list updated in electronic medical record  Hyperlipidemia: (LDL goal < 55) -Uncontrolled -Not at goal - 50% LDL reduction and LDL <50 not achieved  -Current treatment: Atorvastatin 40 mg once daily  -Medications previously tried: crestor-myalgias,  -Current dietary patterns: see DM -Current exercise habits: see DM -Educated on Cholesterol goals;  -Counseled on diet and exercise extensively Could consider ezetimibe 10 mg addition to achieve goal    Diabetes (A1c goal <7%) -Controlled -Current medications: Farxiga 10 mg once daily (New Start 10/2021) Lantus 10 units QHS Novolog sliding scale Metformin 500 mg BID -Medications previously tried: Trulicity (GI effects - does not recall specifically); hot flashes on actos -Counseled on metformin administration - not always taking with meals -Current home glucose readings: 180s on avg per cgm. Equates to a1c ~7.9-8.2 -Denies hypoglycemic/hyperglycemic  symptoms -Current meal patterns: understanding of DM diet is good, trying best to get in a better balance here -Current exercise: no exercise; used to use the treadmill -Educated on A1c and blood sugar goals; Exercise goal of 150 minutes per week; Continuous glucose monitoring; -Diet/exercise recommendations reviewed in detail -Counseled to check feet daily and get yearly eye exams Could re-challenge GLP1 and/or start GLP1-GIP  -Assessed patient finances. No current problems.  Patient Goals/Self-Care Activities Patient will:  - take medications as prescribed as evidenced by patient report and record review  Medication Assistance: None required.  Patient affirms current coverage meets needs.  Patient's preferred pharmacy is:  [id number] - [place], [place] [address] [place] [address] Phone: [phone] Fax: [phone]   Pt endorses 100% compliance  Follow Up:  Patient agrees to Care Plan and Follow-up. Plan: HC 1 month DM call - ask for any updates on diet/exercise. Pharmacist 3 month tel.    The patient verbalized understanding of instructions provided today and agreed to receive a MyChart copy of patient instruction and/or educational materials. Telephone follow up appointment with pharmacy team member scheduled for: See next appointment with "Care Management Staff" under "What's Next" below. Diabetes Mellitus and Nutrition, Adult When you have diabetes, or diabetes mellitus, it is very important to have healthy eating habits because your blood sugar (glucose) levels are greatly affected by what you eat and drink. Eating healthy foods in the right amounts, at about the same times every day, can help you: Manage your blood glucose. Lower your risk of heart disease. Improve your blood pressure. Reach or maintain a healthy weight. What can affect my meal plan? Every person with diabetes  is different, and each person has different needs for a meal plan.  Your health care provider may recommend that you work with a dietitian to make a meal plan that is best for you. Your meal plan may vary depending on factors such as: The calories you need. The medicines you take. Your weight. Your blood glucose, blood pressure, and cholesterol levels. Your activity level. Other health conditions you have, such as heart or kidney disease. How do carbohydrates affect me? Carbohydrates, also called carbs, affect your blood glucose level more than any other type of food. Eating carbs raises the amount of glucose in your blood. It is important to know how many carbs you can safely have in each meal. This is different for every person. Your dietitian can help you calculate how many carbs you should have at each meal and for each snack. How does alcohol affect me? Alcohol can cause a decrease in blood glucose (hypoglycemia), especially if you use insulin or take certain diabetes medicines by mouth. Hypoglycemia can be a life-threatening condition. Symptoms of hypoglycemia, such as sleepiness, dizziness, and confusion, are similar to symptoms of having too much alcohol. Do not drink alcohol if: Your health care provider tells you not to drink. You are pregnant, may be pregnant, or are planning to become pregnant. If you drink alcohol: Limit how much you have to: 0-1 drink a day for women. 0-2 drinks a day for men. Know how much alcohol is in your drink. In the [place] one drink equals one 12 oz bottle of beer (355 mL), one 5 oz glass of wine (148 mL), or one 1 oz glass of hard liquor (44 mL). Keep yourself hydrated with water, diet soda, or unsweetened iced tea. Keep in mind that regular soda, juice, and other mixers may contain a lot of sugar and must be counted as carbs. What are tips for following this plan? Reading food labels Start by checking the serving size on the Nutrition Facts label of packaged foods and drinks. The number of calories and the amount of  carbs, fats, and other nutrients listed on the label are based on one serving of the item. Many items contain more than one serving per package. Check the total grams (g) of carbs in one serving. Check the number of grams of saturated fats and trans fats in one serving. Choose foods that have a low amount or none of these fats. Check the number of milligrams (mg) of salt (sodium) in one serving. Most people should limit total sodium intake to less than 2,300 mg per day. Always check the nutrition information of foods labeled as "low-fat" or "nonfat." These foods may be higher in added sugar or refined carbs and should be avoided. Talk to your dietitian to identify your daily goals for nutrients listed on the label. Shopping Avoid buying canned, pre-made, or processed foods. These foods tend to be high in fat, sodium, and added sugar. Shop around the outside edge of the grocery store. This is where you will most often find fresh fruits and vegetables, bulk grains, fresh meats, and fresh dairy products. Cooking Use low-heat cooking methods, such as baking, instead of high-heat cooking methods, such as deep frying. Cook using healthy oils, such as olive, canola, or sunflower oil. Avoid cooking with butter, cream, or high-fat meats. Meal planning Eat meals and snacks regularly, preferably at the same times every day. Avoid going long periods of time without eating. Eat foods that are high in fiber, such as  fresh fruits, vegetables, beans, and whole grains. Eat 4-6 oz (112-168 g) of lean protein each day, such as lean meat, chicken, fish, eggs, or tofu. One ounce (oz) (28 g) of lean protein is equal to: 1 oz (28 g) of meat, chicken, or fish. 1 egg.  cup (62 g) of tofu. Eat some foods each day that contain healthy fats, such as avocado, nuts, seeds, and fish. What foods should I eat? Fruits Berries. Apples. Oranges. Peaches. Apricots. Plums. Grapes. Mangoes. Papayas. Pomegranates. Kiwi.  Cherries. Vegetables Leafy greens, including lettuce, spinach, kale, chard, collard greens, mustard greens, and cabbage. Beets. Cauliflower. Broccoli. Carrots. Green beans. Tomatoes. Peppers. Onions. Cucumbers. Brussels sprouts. Grains Whole grains, such as whole-wheat or whole-grain bread, crackers, tortillas, cereal, and pasta. Unsweetened oatmeal. Quinoa. Brown or wild rice. Meats and other proteins Seafood. Poultry without skin. Lean cuts of poultry and beef. Tofu. Nuts. Seeds. Dairy Low-fat or fat-free dairy products such as milk, yogurt, and cheese. The items listed above may not be a complete list of foods and beverages you can eat and drink. Contact a dietitian for more information. What foods should I avoid? Fruits Fruits canned with syrup. Vegetables Canned vegetables. Frozen vegetables with butter or cream sauce. Grains Refined white flour and flour products such as bread, pasta, snack foods, and cereals. Avoid all processed foods. Meats and other proteins Fatty cuts of meat. Poultry with skin. Breaded or fried meats. Processed meat. Avoid saturated fats. Dairy Full-fat yogurt, cheese, or milk. Beverages Sweetened drinks, such as soda or iced tea. The items listed above may not be a complete list of foods and beverages you should avoid. Contact a dietitian for more information. Questions to ask a health care provider Do I need to meet with a certified diabetes care and education specialist? Do I need to meet with a dietitian? What number can I call if I have questions? When are the best times to check my blood glucose? Where to find more information: American Diabetes Association: diabetes.org Academy of Nutrition and Dietetics: eatright.[employer] of Diabetes and Digestive and Kidney Diseases: [employer]en[employer].is Association of Diabetes Care & Education Specialists: diabeteseducator.org Summary It is important to have healthy eating habits because your blood sugar  (glucose) levels are greatly affected by what you eat and drink. It is important to use alcohol carefully. A healthy meal plan will help you manage your blood glucose and lower your risk of heart disease. Your health care provider may recommend that you work with a dietitian to make a meal plan that is best for you. This information is not intended to replace advice given to you by your health care provider. Make sure you discuss any questions you have with your health care provider. Document Revised: 06/16/2020 Document Reviewed: 06/16/2020 Elsevier Patient Education  [employer].

## 2022-01-17 ENCOUNTER — Ambulatory Visit: Payer: Self-pay | Admitting: *Deleted

## 2022-01-17 DIAGNOSIS — M0609 Rheumatoid arthritis without rheumatoid factor, multiple sites: Secondary | ICD-10-CM | POA: Diagnosis not present

## 2022-01-17 NOTE — Telephone Encounter (Signed)
I returned pt's call.  She has been feeling sleepy a lot and is wondering if it's a side effect of her new medication Farxiga.     Reason for Disposition  Taking a medicine that could cause weakness (e.g., blood pressure medications, diuretics)  Answer Assessment - Initial Assessment Questions 1. DESCRIPTION: "Describe how you are feeling."     I feel weak and tired all the time since starting the Farxiga.   I feel weak and can take a nap any time.   That's not like me at all.    I'm thirsty all the time too.     Last day of December is when I started it.    2. SEVERITY: "How bad is it?"  "Can you stand and walk?"   - MILD - Feels weak or tired, but does not interfere with work, school or normal activities   - [place] to stand and walk; weakness interferes with work, school, or normal activities   - SEVERE - Unable to stand or walk     I'm so weak I don't want to do anything because I feel so tired.     I get so sleepy when I drive.   3. ONSET:  "When did the weakness begin?"     When I started the [place]. 4. CAUSE: "What do you think is causing the weakness?"     The medicine 5. MEDICINES: "Have you recently started a new medicine or had a change in the amount of a medicine?"     Yes the [place].   I take it before breakfast and about 10:30 AM I start getting so sleepy until that evening.   6. OTHER SYMPTOMS: "Do you have any other symptoms?" (e.g., chest pain, fever, cough, SOB, vomiting, diarrhea, bleeding, other areas of pain)     I feel so tired. 7. PREGNANCY: "Is there any chance you are pregnant?" "When was your last menstrual period?"     N/A  Protocols used: Weakness (Generalized) and Fatigue-A-AH
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
## 2022-01-20 ENCOUNTER — Other Ambulatory Visit: Payer: Self-pay

## 2022-01-20 ENCOUNTER — Ambulatory Visit (INDEPENDENT_AMBULATORY_CARE_PROVIDER_SITE_OTHER): Payer: Medicare Other | Admitting: Nurse Practitioner

## 2022-01-20 ENCOUNTER — Encounter: Payer: Self-pay | Admitting: Nurse Practitioner

## 2022-01-20 VITALS — BP 106/65 | HR 63 | Temp 98.2°F | Ht 65.0 in | Wt 160.8 lb

## 2022-01-20 DIAGNOSIS — R5383 Other fatigue: Secondary | ICD-10-CM

## 2022-01-20 DIAGNOSIS — Z794 Long term (current) use of insulin: Secondary | ICD-10-CM

## 2022-01-20 DIAGNOSIS — E1165 Type 2 diabetes mellitus with hyperglycemia: Secondary | ICD-10-CM

## 2022-01-20 NOTE — Assessment & Plan Note (Addendum)
Chronic, ongoing, followed by endocrinology with A1c 8.4% four weeks ago with PCP.  She wishes to avoid return to endo at this time as her previous provider left -- will monitor here in office at this time and get back into endo as needed.  Will discontinue Farxiga 10 MG daily as tolerated poorly with increased fatigue.  Recommend she continue Basaglar but increase to 15-20 units + sliding scale, continue Metformin.  Continue collaboration with CCM team in office.   Bring Libre to next visit.  Return to office in April as scheduled.  Recheck TSH and BMP today.

## 2022-01-20 NOTE — Patient Instructions (Signed)

## 2022-01-20 NOTE — Progress Notes (Signed)
BP 106/65    Pulse 63    Temp 98.2 F (36.8 C) (Oral)    Ht 5\' 5"  (1.651 m)    Wt 160 lb 12.8 oz (72.9 kg)    SpO2 97%    BMI 26.76 kg/m    Subjective:    Patient ID: Laura Rojas, female    DOB: [birth year], 76 y.o.   MRN: [id number]  HPI: Laura Rojas is a 76 y.o. female  Chief Complaint  Patient presents with   Medication Problem    Patient states she has stopped taking the prescription for Farxiga on Wednesday as it was causing her some fatigue and weakness. Patient state says she is getting better as the days go by, but Thursday she still felt a little weak.    Fatigue   DIABETES Follow-up today due to side effects with [place].  This was started a few months back (December 2022) due to underlying heart issues and elevation in A1c.  She reports Wilder Glade has made her more fatigued and sleeping all the time.  Is hydrating well per her report.   She stopped this on Wednesday and is noticing energy coming back.  Followed by endocrinology and last saw [facility] 05/16/21 with Basaglar increased to 20 units.  Last A1c was 8.4% in December -- at time she restarted Basaglar at 10 units as had not been taking it. She continues on Basaglar, Novolog sliding scale, and Metformin.     She did not tolerate GLP1 in past. Hypoglycemic episodes: a few  Polydipsia/polyuria: no Visual disturbance: no Chest pain: no Paresthesias: no Glucose Monitoring: yes             Accucheck frequency: Daily               Fasting glucose: 200 when gets out of bed at times             Post prandial:             Evening:              Before meals: Taking Insulin?: yes             Long acting insulin: 10 units              Short acting insulin: sliding scale units with meals (16-24 units) Blood Pressure Monitoring: rarely Retinal Examination: Up to Date Foot Exam: Up to Date Pneumovax: Up to Date Influenza: Up to Date Aspirin: yes   Relevant past medical, surgical, family and social history reviewed  and updated as indicated. Interim medical history since our last visit reviewed. Allergies and medications reviewed and updated.  Review of Systems  Constitutional:  Positive for fatigue. Negative for activity change, appetite change, diaphoresis and fever.  Respiratory:  Negative for cough, chest tightness and shortness of breath.   Cardiovascular:  Negative for chest pain, palpitations and leg swelling.  Gastrointestinal: Negative.   Endocrine: Negative for polydipsia, polyphagia and polyuria.  Neurological: Negative.   Psychiatric/Behavioral: Negative.     Per HPI unless specifically indicated above     Objective:    BP 106/65    Pulse 63    Temp 98.2 F (36.8 C) (Oral)    Ht 5\' 5"  (1.651 m)    Wt 160 lb 12.8 oz (72.9 kg)    SpO2 97%    BMI 26.76 kg/m   Wt Readings from Last 3 Encounters:  01/20/22 160 lb 12.8 oz (72.9 kg)  12/23/21  158 lb 9.6 oz (71.9 kg)  11/25/21 159 lb (72.1 kg)    Physical Exam Vitals and nursing note reviewed.  Constitutional:      General: She is awake. She is not in acute distress.    Appearance: She is well-developed, well-groomed and overweight. She is not ill-appearing.  HENT:     Head: Normocephalic.     Right Ear: Hearing normal.     Left Ear: Hearing normal.  Eyes:     General: Lids are normal.        Right eye: No discharge.        Left eye: No discharge.     Conjunctiva/sclera: Conjunctivae normal.     Pupils: Pupils are equal, round, and reactive to light.  Neck:     Thyroid: No thyromegaly.     Vascular: Carotid bruit (bilateral) present.  Cardiovascular:     Rate and Rhythm: Normal rate and regular rhythm.     Heart sounds: Normal heart sounds. No murmur heard.   No gallop.  Pulmonary:     Effort: Pulmonary effort is normal. No accessory muscle usage or respiratory distress.     Breath sounds: No wheezing or rhonchi.  Abdominal:     General: Bowel sounds are normal.     Palpations: Abdomen is soft.  Musculoskeletal:      Cervical back: Normal range of motion and neck supple.     Right lower leg: No edema.     Left lower leg: No edema.  Skin:    General: Skin is warm and dry.  Neurological:     Mental Status: She is alert and oriented to person, place, and time.  Psychiatric:        Attention and Perception: Attention normal.        Mood and Affect: Mood normal.        Speech: Speech normal.        Behavior: Behavior normal. Behavior is cooperative.        Thought Content: Thought content normal.    Results for orders placed or performed in visit on 12/28/21  HM DIABETES EYE EXAM  Result Value Ref Range   HM Diabetic Eye Exam Retinopathy (A) No Retinopathy      Assessment & Plan:   Problem List Items Addressed This Visit       Endocrine   Type 2 diabetes mellitus with hyperglycemia, with long-term current use of insulin ([place]) - Primary    Chronic, ongoing, followed by endocrinology with A1c 8.4% four weeks ago with PCP.  She wishes to avoid return to endo at this time as her previous provider left -- will monitor here in office at this time and get back into endo as needed.  Will discontinue Farxiga 10 MG daily as tolerated poorly with increased fatigue.  Recommend she continue Basaglar but increase to 15-20 units + sliding scale, continue Metformin.  Continue collaboration with CCM team in office.   Bring Libre to next visit.  Return to office in April as scheduled.  Recheck TSH and BMP today.      Relevant Orders   Basic metabolic panel   TSH   Other Visit Diagnoses     Fatigue, unspecified type       Stop Farxiga and check TSH today.   Relevant Orders   TSH        Follow up plan: Return for as scheduled April.

## 2022-01-21 LAB — BASIC METABOLIC PANEL
BUN/Creatinine Ratio: 18 (ref 12–28)
BUN: 20 mg/dL (ref 8–27)
CO2: 24 mmol/L (ref 20–29)
Calcium: 9.6 mg/dL (ref 8.7–10.3)
Chloride: 103 mmol/L (ref 96–106)
Creatinine, Ser: 1.12 mg/dL — ABNORMAL HIGH (ref 0.57–1.00)
Glucose: 227 mg/dL — ABNORMAL HIGH (ref 70–99)
Potassium: 4.4 mmol/L (ref 3.5–5.2)
Sodium: 140 mmol/L (ref 134–144)
eGFR: 51 mL/min/{1.73_m2} — ABNORMAL LOW (ref >59)

## 2022-01-21 LAB — TSH: TSH: 1.36 u[IU]/mL (ref 0.450–4.500)

## 2022-01-22 NOTE — Progress Notes (Signed)
Contacted via [name] morning [name], your labs have returned.  Overall they remain stable, your glucose was definitely better controlled with [place] on board, but due to your side effects we will continue off of this at this time.  Kidney function continues to show some stage 3 kidney disease, but no worsening.  Thyroid lab is at goal.  Any questions? Keep being amazing!!  Thank you for allowing me to participate in your care.  I appreciate you. Kindest regards, Jazmon Kos

## 2022-01-24 DIAGNOSIS — E1169 Type 2 diabetes mellitus with other specified complication: Secondary | ICD-10-CM

## 2022-01-24 DIAGNOSIS — E785 Hyperlipidemia, unspecified: Secondary | ICD-10-CM | POA: Diagnosis not present

## 2022-01-24 DIAGNOSIS — E1165 Type 2 diabetes mellitus with hyperglycemia: Secondary | ICD-10-CM | POA: Diagnosis not present

## 2022-01-24 DIAGNOSIS — Z794 Long term (current) use of insulin: Secondary | ICD-10-CM

## 2022-01-25 ENCOUNTER — Ambulatory Visit (INDEPENDENT_AMBULATORY_CARE_PROVIDER_SITE_OTHER): Payer: Medicare Other | Admitting: Dermatology

## 2022-01-25 ENCOUNTER — Other Ambulatory Visit: Payer: Self-pay

## 2022-01-25 DIAGNOSIS — R21 Rash and other nonspecific skin eruption: Secondary | ICD-10-CM

## 2022-01-25 MED ORDER — CLOBETASOL PROPIONATE 0.05 % EX CREA: TOPICAL_CREAM | CUTANEOUS | 1 refills | Status: DC

## 2022-01-25 NOTE — Patient Instructions (Addendum)
Start Clobetasol Cream - spot treat affected areas twice daily until improved (may cover with bandage). Avoid face, groin, underarms.  ? ?Topical steroids (such as triamcinolone, fluocinolone, fluocinonide, mometasone, clobetasol, halobetasol, betamethasone, hydrocortisone) can cause thinning and lightening of the skin if they are used for too long in the same area. Your physician has selected the right strength medicine for your problem and area affected on the body. Please use your medication only as directed by your physician to prevent side effects.  ? ? ? ?If You Need Anything After Your Visit ? ?If you have any questions or concerns for your doctor, please call our main line at [phone] and press option 4 to reach your doctor's medical assistant. If no one answers, please leave a voicemail as directed and we will return your call as soon as possible. Messages left after 4 pm will be answered the following business day.  ? ?You may also send [place] a message via MyChart. We typically respond to MyChart messages within 1-2 business days. ? ?For prescription refills, please ask your pharmacy to contact our office. Our fax number is [phone]. ? ?If you have an urgent issue when the clinic is closed that cannot wait until the next business day, you can page your doctor at the number below.   ? ?Please note that while we do our best to be available for urgent issues outside of office hours, we are not available 24/7.  ? ?If you have an urgent issue and are unable to reach [place], you may choose to seek medical care at your doctor's office, retail clinic, urgent care center, or emergency room. ? ?If you have a medical emergency, please immediately call 911 or go to the emergency department. ? ?Pager Numbers ? ?- Dr. Nehemiah Massed: [phone] ? ?- Dr. Laurence Ferrari: [phone] ? ?- Dr. Nicole Kindred: [phone] ? ?In the event of inclement weather, please call our main line at [phone] for an update on the status of any delays  or closures. ? ?Dermatology Medication Tips: ?Please keep the boxes that topical medications come in in order to help keep track of the instructions about where and how to use these. Pharmacies typically print the medication instructions only on the boxes and not directly on the medication tubes.  ? ?If your medication is too expensive, please contact our office at [phone] option 4 or send [place] a message through [place].  ? ?We are unable to tell what your co-pay for medications will be in advance as this is different depending on your insurance coverage. However, we may be able to find a substitute medication at lower cost or fill out paperwork to get insurance to cover a needed medication.  ? ?If a prior authorization is required to get your medication covered by your insurance company, please allow [place] 1-2 business days to complete this process. ? ?Drug prices often vary depending on where the prescription is filled and some pharmacies may offer cheaper prices. ? ?The website www.goodrx.com contains coupons for medications through different pharmacies. The prices here do not account for what the cost may be with help from insurance (it may be cheaper with your insurance), but the website can give you the price if you did not use any insurance.  ?- You can print the associated coupon and take it with your prescription to the pharmacy.  ?- You may also stop by our office during regular business hours and pick up a GoodRx coupon card.  ?- If you need your prescription  sent electronically to a different pharmacy, notify our office through [facility] or by phone at [phone] option 4. ? ? ? ? ?Si Usted Necesita Algo Despu?s de Su Visita ? ?Tambi?n puede enviarnos un mensaje a trav?s de MyChart. Por lo general respondemos a los mensajes de MyChart en el transcurso de 1 a 2 d?as h?biles. ? ?Para renovar recetas, por favor pida a su farmacia que se ponga en contacto con nuestra oficina. Nuestro n?mero de  fax es el [phone]. ? ?Si tiene un asunto urgente cuando la cl?nica est? cerrada y que no puede esperar hasta el siguiente d?a h?bil, puede llamar/localizar a su doctor(a) al n?mero que aparece a continuaci?n.  ? ?Por favor, tenga en cuenta que aunque hacemos todo lo posible para estar disponibles para asuntos urgentes fuera del horario de oficina, no estamos disponibles las 24 horas del d?a, los 7 d?as de la semana.  ? ?Si tiene un problema urgente y no puede comunicarse con nosotros, puede optar por buscar atenci?n m?dica  en el consultorio de su doctor(a), en una cl?nica privada, en un centro de atenci?n urgente o en una sala de emergencias. ? ?Si tiene Engineer, maintenance (IT) m?dica, por favor llame inmediatamente al 911 o vaya a la sala de emergencias. ? ?N?meros de b?per ? ?- Dr. Nehemiah Massed: [phone] ? ?- Dra. Moye: [phone] ? ?- Dra. Nicole Kindred: [phone] ? ?En caso de inclemencias del tiempo, por favor llame a nuestra l?nea principal al [phone] para una actualizaci?n sobre el estado de cualquier retraso o cierre. ? ?Consejos para la medicaci?n en dermatolog?a: ?Por favor, guarde las cajas en las que vienen los medicamentos de uso t?pico para ayudarle a seguir las instrucciones sobre d?nde y c?mo usarlos. Las farmacias generalmente imprimen las instrucciones del medicamento s?lo en las cajas y no directamente en los tubos del [place].  ? ?Si su medicamento es muy caro, por favor, p?ngase en contacto con Zigmund Daniel llamando al [phone] y presione la opci?n 4 o env?enos un mensaje a trav?s de MyChart.  ? ?No podemos decirle cu?l ser? su copago por los medicamentos por adelantado ya que esto es diferente dependiendo de la cobertura de su seguro. Sin embargo, es posible que podamos encontrar un medicamento sustituto a Electrical engineer un formulario para que el seguro cubra el medicamento que se considera necesario.  ? ?Si se requiere Ardelia Mems autorizaci?n previa para que su compa??a de seguros  [place] su medicamento, por favor perm?tanos de 1 a 2 d?as h?biles para completar este proceso. ? ?Los precios de los medicamentos var?an con frecuencia dependiendo del Environmental consultant de d?nde se surte la receta y alguna farmacias pueden ofrecer precios m?s baratos. ? ?El sitio web www.goodrx.com tiene cupones para medicamentos de Airline pilot. Los precios aqu? no tienen en cuenta lo que podr?a costar con la ayuda del seguro (puede ser m?s barato con su seguro), pero el sitio web puede darle el precio si no utiliz? ning?n seguro.  ?- Puede imprimir el cup?n correspondiente y llevarlo con su receta a la farmacia.  ?- Tambi?n puede pasar por nuestra oficina durante el horario de atenci?n regular y recoger una tarjeta de cupones de GoodRx.  ?- Si necesita que su receta se env?e electr?nicamente a Chiropodist, informe a nuestra oficina a trav?s de MyChart de [place] o por tel?fono llamando al [phone] y presione la opci?n 4. ? ?

## 2022-01-25 NOTE — Progress Notes (Signed)
? ?  Follow-Up Visit ?  ?Subjective  ?Laura Rojas is a 76 y.o. female who presents for the following: Rash (Arms mostly, sometimes on legs and abdomen x several months. Rash is better today. Uses Dove soap and Johnson's Baby Lotion. She tried several OTC creams to help with itch. Dr Posey Pronto prescribed hydroxyzine 25mg , but it makes patient too drowsy. She has also tried Benadryl and oral Prednisone several months ago. Patient doesn't like taking Prednisone due to high blood sugar. She tried TMC 0.1% Cream, which helped a little.). ?She thinks it could be coming from Cimzia injections for RA. Rash comes up as a few red bumps at a time- very itchy. She has 2 dogs, not aware of any problems with fleas. No one else has similar rash in household.  She hasn't been outside much over the winter. ? ?The following portions of the chart were reviewed this encounter and updated as appropriate:  ?  ?  ? ?Review of Systems:  No other skin or systemic complaints except as noted in HPI or Assessment and Plan. ? ?Objective  ?Well appearing patient in no apparent distress; mood and affect are within normal limits. ? ?A focused examination was performed including face, arms. Relevant physical exam findings are noted in the Assessment and Plan. ? ?arms ?Healing excoriations on the abdomen; arms clear. Photos showed few scattered excoriated pink papules, very itchy per pt ? ? ? ?Assessment & Plan  ?Rash and other nonspecific skin eruption ?arms ? ?Possible Bite Reaction, rash currently clear today, but photos viewed. ? ?Advised patient doubt reaction to Cimzia injections.  ?Patient will have dogs checked for fleas, and check mattress for bed bugs. ? ?Start Clobetasol Cream Spot treat AAs BID until improved dsp 30g 1Rf. Avoid face, groin, axilla.  ? ?If patient not improving and continues to have outbreaks, recommend patient return to clinic for evaluation when rash is present for possible biopsy. ? ?Topical steroids (such as  triamcinolone, fluocinolone, fluocinonide, mometasone, clobetasol, halobetasol, betamethasone, hydrocortisone) can cause thinning and lightening of the skin if they are used for too long in the same area. Your physician has selected the right strength medicine for your problem and area affected on the body. Please use your medication only as directed by your physician to prevent side effects.  ? ? ?clobetasol cream (TEMOVATE) 0.05 % - arms ?Spot treat affected areas twice a day until improved. Avoid face, groin, axilla. ? ? ?Return if symptoms worsen or fail to improve. ? ?I, Jamesetta Orleans, CMA, am acting as scribe for Brendolyn Patty, MD . ?Documentation: I have reviewed the above documentation for accuracy and completeness, and I agree with the above. ? ?Brendolyn Patty MD  ? ?

## 2022-02-02 ENCOUNTER — Other Ambulatory Visit: Payer: Self-pay | Admitting: Nurse Practitioner

## 2022-02-02 DIAGNOSIS — I10 Essential (primary) hypertension: Secondary | ICD-10-CM

## 2022-02-02 NOTE — Telephone Encounter (Signed)
Requested Prescriptions  ?Pending Prescriptions Disp Refills  ?? hydrochlorothiazide (MICROZIDE) 12.5 MG capsule [Pharmacy Med Name: HYDROCHLOROTHIAZIDE 12.5 MG CP] 90 capsule 0  ?  Sig: TAKE 1 CAPSULE BY MOUTH EVERY DAY  ?  ? Cardiovascular: Diuretics - Thiazide Failed - 02/02/2022  1:44 AM  ?  ?  Failed - Cr in normal range and within 180 days  ?  Creatinine  ?Date Value Ref Range Status  ?04/29/2021 106.2 20.0 - 300.0 mg/dL Final  ? ?Creatinine, Ser  ?Date Value Ref Range Status  ?01/20/2022 1.12 (H) 0.57 - 1.00 mg/dL Final  ?   ?  ?  Passed - K in normal range and within 180 days  ?  Potassium  ?Date Value Ref Range Status  ?01/20/2022 4.4 3.5 - 5.2 mmol/L Final  ?   ?  ?  Passed - Na in normal range and within 180 days  ?  Sodium  ?Date Value Ref Range Status  ?01/20/2022 140 134 - 144 mmol/L Final  ?   ?  ?  Passed - Last BP in normal range  ?  BP Readings from Last 1 Encounters:  ?01/20/22 106/65  ?   ?  ?  Passed - Valid encounter within last 6 months  ?  Recent Outpatient Visits   ?      ? 1 week ago Type 2 diabetes mellitus with hyperglycemia, with long-term current use of insulin ([place])  ? Washburn, [place] T, NP  ? 1 month ago Type 2 diabetes mellitus with hyperglycemia, with long-term current use of insulin ([place])  ? [place], [place] T, NP  ? 2 months ago Type 2 diabetes mellitus with hyperglycemia, with long-term current use of insulin ([place])  ? Pickrell, Rosebud T, NP  ? 6 months ago Cough  ? [place], [place] T, NP  ? 6 months ago Type 2 diabetes mellitus with hyperglycemia, with long-term current use of insulin ([place])  ? [facility] [place], Henrine Screws T, NP  ?  ?  ?Future Appointments   ?        ? In 2 weeks Cannady, Barbaraann Faster, NP [employer], [facility]  ? In 7 months Ralene Bathe, MD [place]  ? In 7 months  [place]  ?  ? ?  ?  ?  ? ? ?

## 2022-02-14 DIAGNOSIS — M0609 Rheumatoid arthritis without rheumatoid factor, multiple sites: Secondary | ICD-10-CM | POA: Diagnosis not present

## 2022-02-19 NOTE — Patient Instructions (Signed)
Managing Anxiety, Adult ?After being diagnosed with anxiety, you may be relieved to know why you have felt or behaved a certain way. You may also feel overwhelmed about the treatment ahead and what it will mean for your life. With care and support, you can manage this condition. ?How to manage lifestyle changes ?Managing stress and anxiety ?Stress is your body's reaction to life changes and events, both good and bad. Most stress will last just a few hours, but stress can be ongoing and can lead to more than just stress. Although stress can play a major role in anxiety, it is not the same as anxiety. Stress is usually caused by something external, such as a deadline, test, or competition. Stress normally passes after the triggering event has ended.  ?Anxiety is caused by something internal, such as imagining a terrible outcome or worrying that something will go wrong that will devastate you. Anxiety often does not go away even after the triggering event is over, and it can become long-term (chronic) worry. It is important to understand the differences between stress and anxiety and to manage your stress effectively so that it does not lead to an anxious response. ?Talk with your health care provider or a counselor to learn more about reducing anxiety and stress. He or she may suggest tension reduction techniques, such as: ?Music therapy. Spend time creating or listening to music that you enjoy and that inspires you. ?Mindfulness-based meditation. Practice being aware of your normal breaths while not trying to control your breathing. It can be done while sitting or walking. ?Centering prayer. This involves focusing on a word, phrase, or sacred image that means something to you and brings you peace. ?Deep breathing. To do this, expand your stomach and inhale slowly through your nose. Hold your breath for 3-5 seconds. Then exhale slowly, letting your stomach muscles relax. ?Self-talk. Learn to notice and identify  thought patterns that lead to anxiety reactions and change those patterns to thoughts that feel peaceful. ?Muscle relaxation. Taking time to tense muscles and then relax them. ?Choose a tension reduction technique that fits your lifestyle and personality. These techniques take time and practice. Set aside 5-15 minutes a day to do them. Therapists can offer counseling and training in these techniques. The training to help with anxiety may be covered by some insurance plans. ?Other things you can do to manage stress and anxiety include: ?Keeping a stress diary. This can help you learn what triggers your reaction and then learn ways to manage your response. ?Thinking about how you react to certain situations. You may not be able to control everything, but you can control your response. ?Making time for activities that help you relax and not feeling guilty about spending your time in this way. ?Doing visual imagery. This involves imagining or creating mental pictures to help you relax. ?Practicing yoga. Through yoga poses, you can lower tension and promote relaxation. ? ?Medicines ?Medicines can help ease symptoms. Medicines for anxiety include: ?Antidepressant medicines. These are usually prescribed for long-term daily control. ?Anti-anxiety medicines. These may be added in severe cases, especially when panic attacks occur. ?Medicines will be prescribed by a health care provider. When used together, medicines, psychotherapy, and tension reduction techniques may be the most effective treatment. ?Relationships ?Relationships can play a big part in helping you recover. Try to spend more time connecting with trusted friends and family members. ?Consider going to couples counseling if you have a partner, taking family education classes, or going to family   therapy. ?Therapy can help you and others better understand your condition. ?How to recognize changes in your anxiety ?Everyone responds differently to treatment for  anxiety. Recovery from anxiety happens when symptoms decrease and stop interfering with your daily activities at home or work. This may mean that you will start to: ?Have better concentration and focus. Worry will interfere less in your daily thinking. ?Sleep better. ?Be less irritable. ?Have more energy. ?Have improved memory. ?It is also important to recognize when your condition is getting worse. Contact your health care provider if your symptoms interfere with home or work and you feel like your condition is not improving. ?Follow these instructions at home: ?Activity ?Exercise. Adults should do the following: ?Exercise for at least 150 minutes each week. The exercise should increase your heart rate and make you sweat (moderate-intensity exercise). ?Strengthening exercises at least twice a week. ?Get the right amount and quality of sleep. Most adults need 7-9 hours of sleep each night. ?Lifestyle ? ?Eat a healthy diet that includes plenty of vegetables, fruits, whole grains, low-fat dairy products, and lean protein. ?Do not eat a lot of foods that are high in fats, added sugars, or salt (sodium). ?Make choices that simplify your life. ?Do not use any products that contain nicotine or tobacco. These products include cigarettes, chewing tobacco, and vaping devices, such as e-cigarettes. If you need help quitting, ask your health care provider. ?Avoid caffeine, alcohol, and certain over-the-counter cold medicines. These may make you feel worse. Ask your pharmacist which medicines to avoid. ?General instructions ?Take over-the-counter and prescription medicines only as told by your health care provider. ?Keep all follow-up visits. This is important. ?Where to find support ?You can get help and support from these sources: ?Self-help groups. ?Online and community organizations. ?A trusted spiritual leader. ?Couples counseling. ?Family education classes. ?Family therapy. ?Where to find more information ?You may find  that joining a support group helps you deal with your anxiety. The following sources can help you locate counselors or support groups near you: ?Mental Health America: www.mentalhealthamerica.net ?Anxiety and Depression Association of America (ADAA): www.adaa.org ?National Alliance on Mental Illness (NAMI): www.nami.org ?Contact a health care provider if: ?You have a hard time staying focused or finishing daily tasks. ?You spend many hours a day feeling worried about everyday life. ?You become exhausted by worry. ?You start to have headaches or frequently feel tense. ?You develop chronic nausea or diarrhea. ?Get help right away if: ?You have a racing heart and shortness of breath. ?You have thoughts of hurting yourself or others. ?If you ever feel like you may hurt yourself or others, or have thoughts about taking your own life, get help right away. Go to your nearest emergency department or: ?Call your local emergency services (911 in the [place] ?Call a suicide crisis helpline, such as the National Suicide Prevention Lifeline at 1-800-273-8255 or 988 in the [place] This is open 24 hours a day in the [place] ?Text the Crisis Text Line at 741741 (in the [place] ?Summary ?Taking steps to learn and use tension reduction techniques can help calm you and help prevent triggering an anxiety reaction. ?When used together, medicines, psychotherapy, and tension reduction techniques may be the most effective treatment. ?Family, friends, and partners can play a big part in supporting you. ?This information is not intended to replace advice given to you by your health care provider. Make sure you discuss any questions you have with your health care provider. ?Document Revised: 06/08/2021 Document Reviewed: 03/06/2021 ?Elsevier Patient   Education ? 2022 Elsevier Inc. ? ?

## 2022-02-21 ENCOUNTER — Encounter: Payer: Self-pay | Admitting: Nurse Practitioner

## 2022-02-21 ENCOUNTER — Other Ambulatory Visit: Payer: Self-pay

## 2022-02-21 ENCOUNTER — Ambulatory Visit (INDEPENDENT_AMBULATORY_CARE_PROVIDER_SITE_OTHER): Payer: Medicare Other | Admitting: Nurse Practitioner

## 2022-02-21 VITALS — BP 108/72 | HR 60 | Temp 98.0°F | Wt 161.2 lb

## 2022-02-21 DIAGNOSIS — M353 Polymyalgia rheumatica: Secondary | ICD-10-CM

## 2022-02-21 DIAGNOSIS — F413 Other mixed anxiety disorders: Secondary | ICD-10-CM | POA: Diagnosis not present

## 2022-02-21 DIAGNOSIS — E785 Hyperlipidemia, unspecified: Secondary | ICD-10-CM | POA: Diagnosis not present

## 2022-02-21 DIAGNOSIS — Z79899 Other long term (current) drug therapy: Secondary | ICD-10-CM

## 2022-02-21 DIAGNOSIS — G2581 Restless legs syndrome: Secondary | ICD-10-CM

## 2022-02-21 DIAGNOSIS — I152 Hypertension secondary to endocrine disorders: Secondary | ICD-10-CM | POA: Diagnosis not present

## 2022-02-21 DIAGNOSIS — E538 Deficiency of other specified B group vitamins: Secondary | ICD-10-CM

## 2022-02-21 DIAGNOSIS — E1169 Type 2 diabetes mellitus with other specified complication: Secondary | ICD-10-CM

## 2022-02-21 DIAGNOSIS — E1122 Type 2 diabetes mellitus with diabetic chronic kidney disease: Secondary | ICD-10-CM

## 2022-02-21 DIAGNOSIS — N183 Chronic kidney disease, stage 3 unspecified: Secondary | ICD-10-CM

## 2022-02-21 DIAGNOSIS — D508 Other iron deficiency anemias: Secondary | ICD-10-CM | POA: Diagnosis not present

## 2022-02-21 DIAGNOSIS — E1159 Type 2 diabetes mellitus with other circulatory complications: Secondary | ICD-10-CM

## 2022-02-21 DIAGNOSIS — F339 Major depressive disorder, recurrent, unspecified: Secondary | ICD-10-CM | POA: Diagnosis not present

## 2022-02-21 DIAGNOSIS — Z794 Long term (current) use of insulin: Secondary | ICD-10-CM

## 2022-02-21 DIAGNOSIS — I771 Stricture of artery: Secondary | ICD-10-CM

## 2022-02-21 DIAGNOSIS — E1165 Type 2 diabetes mellitus with hyperglycemia: Secondary | ICD-10-CM

## 2022-02-21 DIAGNOSIS — I25708 Atherosclerosis of coronary artery bypass graft(s), unspecified, with other forms of angina pectoris: Secondary | ICD-10-CM

## 2022-02-21 DIAGNOSIS — F5101 Primary insomnia: Secondary | ICD-10-CM

## 2022-02-21 LAB — MICROALBUMIN, URINE WAIVED
Creatinine, Urine Waived: 300 mg/dL (ref 10–300)
Microalb, Ur Waived: 10 mg/L (ref 0–19)
Microalb/Creat Ratio: <30 mg/g (ref <30)

## 2022-02-21 LAB — BAYER DCA HB A1C WAIVED: HB A1C (BAYER DCA - WAIVED): 7.9 % — ABNORMAL HIGH (ref 4.8–5.6)

## 2022-02-21 MED ORDER — SITAGLIPTIN PHOSPHATE 100 MG PO TABS: 100.0000 mg | ORAL_TABLET | Freq: Every day | ORAL | 4 refills | Status: DC

## 2022-02-21 MED ORDER — LOSARTAN POTASSIUM 100 MG PO TABS: 100.0000 mg | ORAL_TABLET | Freq: Every day | ORAL | 4 refills | Status: DC

## 2022-02-21 MED ORDER — SERTRALINE HCL 100 MG PO TABS: ORAL_TABLET | ORAL | 4 refills | Status: DC

## 2022-02-21 MED ORDER — METFORMIN HCL 1000 MG PO TABS: 500.0000 mg | ORAL_TABLET | Freq: Two times a day (BID) | ORAL | 4 refills | Status: DC

## 2022-02-21 MED ORDER — GABAPENTIN (ONCE-DAILY) 300 MG PO TABS: 300.0000 mg | ORAL_TABLET | Freq: Every day | ORAL | 4 refills | Status: DC

## 2022-02-21 MED ORDER — HYDROCHLOROTHIAZIDE 12.5 MG PO CAPS: ORAL_CAPSULE | ORAL | 4 refills | Status: DC

## 2022-02-21 MED ORDER — LORAZEPAM 0.5 MG PO TABS: ORAL_TABLET | ORAL | 0 refills | Status: DC

## 2022-02-21 MED ORDER — AMLODIPINE BESYLATE 2.5 MG PO TABS
2.5000 mg | ORAL_TABLET | Freq: Every day | ORAL | 4 refills | Status: DC
Start: 2022-02-21 — End: 2022-12-08

## 2022-02-21 MED ORDER — ATORVASTATIN CALCIUM 40 MG PO TABS: ORAL_TABLET | ORAL | 4 refills | Status: DC

## 2022-02-21 NOTE — Assessment & Plan Note (Signed)
Chronic, ongoing.  Continue collaboration with rheumatology and current medication regimen.   ?

## 2022-02-21 NOTE — Assessment & Plan Note (Signed)
Chronic, ongoing, followed by endocrinology in past with A1c 7.9% today, trending down.  She wishes to avoid return to endo at this time as her previous provider left.  Will start Januvia 100 MG daily (renal dose as needed) + recommend she continue Basaglar at 10 units + cut back on her sliding scale with this, continue Metformin.  Continue collaboration with CCM team in office.   Bring Libre to next visit.  Return to office in 4 weeks.  Recheck BMP today. ?

## 2022-02-21 NOTE — Assessment & Plan Note (Signed)
Ongoing, continue daily supplement and check iron/ferritin + CBC today. ?

## 2022-02-21 NOTE — Assessment & Plan Note (Signed)
Chronic, stable with BP at goal in office today.  Continue current medication regimen and collaboration with cardiology. BMP & CBC + urine ALB on labs today.  Recommend she monitor BP at least three days a week at home and document for providers + bring to visits.  DASH diet focus.  Return in 3 months. ?

## 2022-02-21 NOTE — Progress Notes (Signed)
? ?BP 108/72   Pulse 60   Temp 98 ?F (36.7 ?C) (Oral)   Wt 161 lb 3.2 oz (73.1 kg)   SpO2 93%   BMI 26.83 kg/m?   ? ?Subjective:  ? ? Patient ID: Laura Rojas, female    DOB: [birth year], 76 y.o.   MRN: [id number] ? ?HPI: ?SUNDEEP Rojas is a 76 y.o. female ? ?Chief Complaint  ?Patient presents with  ? Diabetes  ? Hyperlipidemia  ? Hypertension  ? Mood  ? Chronic Kidney Disease  ? RLS  ? Anemia  ? ?DIABETES ?Followed by endocrinology and last saw NP [facility] -  05/16/21, however had not returned as NP no longer at the practice.  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  Wilder Glade made her too fatigued and she has stopped this.  She continues on Basaglar, Novolog sliding scale, and Metformin.   ? ?Does endorse occasional poor diet -- does enjoy sweets. ?  ?She did not tolerate GLP1 (Trulicity) in past. ?Hypoglycemic episodes: a few  ?Polydipsia/polyuria: no ?Visual disturbance: no ?Chest pain: no ?Paresthesias: no ?Glucose Monitoring: yes ?            Accucheck frequency: Daily  ranging 170 to 200 ?            Fasting glucose:  ?            Post prandial: ?            Evening:  ?            Before meals: ?Taking Insulin?: yes ?            Long acting insulin: 10 units  ?            Short acting insulin: sliding scale units with meals (16-24 units) ?Blood Pressure Monitoring: rarely ?Retinal Examination: Up to Date ?Foot Exam: Up to Date ?Pneumovax: Up to Date ?Influenza: Up to Date ?Aspirin: yes  ? ?HYPERTENSION / HYPERLIPIDEMIA/CAD ?She is followed by cardiology, Dr. Sallyanne Kuster. Last saw 10/19/21, sees yearly. Cardiology follows due to subclavian artery stenosis and angina. Continues on Amlodipine, Plavix, HCTZ, Fish oil, Losartan, Lipitor.  ?Satisfied with current treatment? yes ?Duration of hypertension: chronic ?BP monitoring frequency: not checking ?BP range:not checking ?BP medication side effects: no ?Duration of hyperlipidemia: chronic ?Cholesterol  medication side effects: no ?Cholesterol supplements: fish oil ?Medication compliance: good compliance ?Aspirin: yes ?Recent stressors: no ?Recurrent headaches: no ?Visual changes: no ?Palpitations: no ?Dyspnea: no ?Chest pain: no ?Lower extremity edema: no ?Dizzy/lightheaded: no  ? ?ANEMIA ?Continues on supplements daily for iron and B12. ?Anemia status: stable ?Etiology of anemia: CKD ?Duration of anemia treatment: ongoing/stable ?Compliance with treatment: good compliance ?Iron supplementation side effects: no ?Severity of anemia: moderate ?Fatigue: yes ?Decreased exercise tolerance: no  ?Dyspnea on exertion: no ?Palpitations: no ?Bleeding: no ?Pica: no  ?  ?CHRONIC KIDNEY DISEASE ?Last remained stable. ?CKD status: stable ?Medications renally dose: yes ?Previous renal evaluation: no ?Pneumovax:  Up to Date ?Influenza Vaccine:  Up to Date  ?  ?POLYMYALGIA RHEUMATICA & RLS ?Followed by rheumatology with diagnosis of polymyalgia rheumatica syndrome. Saw last 01/03/22, getting injections at this time -- she reports this is drastically helping pain. Continues on Gabapentin for RLS. ?Duration: chronic ?Involved joints: right hand, both feet, and left knee ?Mechanism of injury: arthritis ?Severity: 1-2/10 at worst ?Quality:  dull, aching and burning ?Frequency: intermittent  ?Radiation: no ?Aggravating factors: walking and movement  ?  Alleviating factors: Aspercreme and APAP  ?Status: fluctuating ?Treatments attempted: Tylenol and Aspercreme ?Relief with NSAIDs?:  No NSAIDs Taken ?Weakness with weight bearing or walking: no ?Sensation of giving way: no ?Locking: no ?Popping: no ?Bruising: no ?Swelling: no ?Redness: no ?Paresthesias/decreased sensation: no ?Fevers: no ?  ?ANXIETY/STRESS ?Taking Zoloft 150 MG daily.  Is taking Ativan 0.5 MG, takes this at night only, although ordered BID -- currently reports she takes 1 MG at night. Occasionally will take extra dose at day time, but not often.  Pt aware of risks of benzo  medication use to include increased sedation, respiratory suppression, falls, dependence and cardiovascular events.  Pt would like to continue treatment as benefit determined to outweigh risk.  On review PDMP last Ativan fill 01/07/22 for 3 months supply, previous PCP ordered this way. ?Duration:stable ?Anxious mood: yes  ?Excessive worrying: no ?Irritability: no  ?Sweating: no ?Nausea: no ?Palpitations:no ?Hyperventilation: no ?Panic attacks: no ?Agoraphobia: no  ?Obscessions/compulsions: no ?Depressed mood: no ? ?  02/21/2022  ?  2:07 PM 11/25/2021  ?  8:30 AM 09/02/2021  ?  8:19 AM 08/02/2021  ?  1:42 PM 04/29/2021  ?  3:36 PM  ?Depression screen PHQ 2/9  ?Decreased Interest 0 0 0 0 0  ?Down, Depressed, Hopeless 1 0 0 0 0  ?PHQ - 2 Score 1 0 0 0 0  ?Altered sleeping 0 0  1 3  ?Tired, decreased energy 1 1  1 3   ?Change in appetite 2 1   1   ?Feeling bad or failure about yourself  0 0  0 0  ?Trouble concentrating 0 0  0 0  ?Moving slowly or fidgety/restless 0 0  0 0  ?Suicidal thoughts 0 0  0 0  ?PHQ-9 Score 4 2  2 7   ?Difficult doing work/chores  Not difficult at all  Not difficult at all   ?  ?Relevant past medical, surgical, family and social history reviewed and updated as indicated. Interim medical history since our last visit reviewed. ?Allergies and medications reviewed and updated. ? ?Review of Systems ? ?Per HPI unless specifically indicated above ? ?   ?Objective:  ?  ?BP 108/72   Pulse 60   Temp 98 ?F (36.7 ?C) (Oral)   Wt 161 lb 3.2 oz (73.1 kg)   SpO2 93%   BMI 26.83 kg/m?   ?Wt Readings from Last 3 Encounters:  ?02/21/22 161 lb 3.2 oz (73.1 kg)  ?01/20/22 160 lb 12.8 oz (72.9 kg)  ?12/23/21 158 lb 9.6 oz (71.9 kg)  ?  ?Physical Exam ?Vitals and nursing note reviewed.  ?Constitutional:   ?   General: She is awake. She is not in acute distress. ?   Appearance: She is well-developed, well-groomed and overweight. She is not ill-appearing.  ?HENT:  ?   Head: Normocephalic.  ?   Right Ear: Hearing normal.  ?    Left Ear: Hearing normal.  ?Eyes:  ?   General: Lids are normal.     ?   Right eye: No discharge.     ?   Left eye: No discharge.  ?   Conjunctiva/sclera: Conjunctivae normal.  ?   Pupils: Pupils are equal, round, and reactive to light.  ?Neck:  ?   Thyroid: No thyromegaly.  ?   Vascular: Carotid bruit (bilateral) present.  ?Cardiovascular:  ?   Rate and Rhythm: Normal rate and regular rhythm.  ?   Heart sounds: Normal heart sounds. No murmur heard. ?  No  gallop.  ?Pulmonary:  ?   Effort: Pulmonary effort is normal. No accessory muscle usage or respiratory distress.  ?   Breath sounds: No wheezing or rhonchi.  ?Abdominal:  ?   General: Bowel sounds are normal.  ?   Palpations: Abdomen is soft.  ?Musculoskeletal:  ?   Cervical back: Normal range of motion and neck supple.  ?   Right lower leg: No edema.  ?   Left lower leg: No edema.  ?Skin: ?   General: Skin is warm and dry.  ?Neurological:  ?   Mental Status: She is alert and oriented to person, place, and time.  ?Psychiatric:     ?   Attention and Perception: Attention normal.     ?   Mood and Affect: Mood normal.     ?   Speech: Speech normal.     ?   Behavior: Behavior normal. Behavior is cooperative.     ?   Thought Content: Thought content normal.  ? ?Diabetic Foot Exam - Simple   ?Simple Foot Form ?Visual Inspection ?No deformities, no ulcerations, no other skin breakdown bilaterally: Yes ?Sensation Testing ?Intact to touch and monofilament testing bilaterally: Yes ?Pulse Check ?Posterior Tibialis and Dorsalis pulse intact bilaterally: Yes ?Comments ?  ?  ? ?Results for orders placed or performed in visit on 01/20/22  ?Basic metabolic panel  ?Result Value Ref Range  ? Glucose 227 (H) 70 - 99 mg/dL  ? BUN 20 8 - 27 mg/dL  ? Creatinine, Ser 1.12 (H) 0.57 - 1.00 mg/dL  ? eGFR 51 (L) >59 mL/min/1.73  ? BUN/Creatinine Ratio 18 12 - 28  ? Sodium 140 134 - 144 mmol/L  ? Potassium 4.4 3.5 - 5.2 mmol/L  ? Chloride 103 96 - 106 mmol/L  ? CO2 24 20 - 29 mmol/L  ?  Calcium 9.6 8.7 - 10.3 mg/dL  ?TSH  ?Result Value Ref Range  ? TSH 1.360 0.450 - 4.500 uIU/mL  ? ?   ?Assessment & Plan:  ? ?Problem List Items Addressed This Visit   ? ?  ? Cardiovascular and Mediastinum  ? Coronary arter

## 2022-02-21 NOTE — Assessment & Plan Note (Signed)
Chronic, stable with Gabapentin, continue this regimen and adjust as needed. 

## 2022-02-21 NOTE — Assessment & Plan Note (Signed)
Chronic, ongoing with use of Lorazepam nightly.  Has had trial reductions in past and other medications without success.  Is fully aware of risks of chronic benzo use and aware this will be discussed each visit.  Return in 3 months.  UDS due 04/29/22, contract up to date. ?

## 2022-02-21 NOTE — Assessment & Plan Note (Signed)
Stable, continue collaboration with cardiology and preventative treatment ASA, Plavix, and statin. ?

## 2022-02-21 NOTE — Assessment & Plan Note (Addendum)
Chronic, ongoing.  Continue current medication regimen and adjust as needed.  Lipid panel today.  Discussed with patient, if myalgias with Atorvastatin, + her history of myalgia with statins, could consider Repatha initiation if poor tolerance. 

## 2022-02-21 NOTE — Assessment & Plan Note (Signed)
Ongoing, continue supplement and recheck level every 6 months. 

## 2022-02-21 NOTE — Assessment & Plan Note (Signed)
Chronic, ongoing for insomnia.  Has had trials of other medications and reductions with poor outcomes.  Fully aware of risks of chronic benzo use and aware this will be discussed each visit.  UDS up to date. ?

## 2022-02-21 NOTE — Assessment & Plan Note (Signed)
Chronic, stable.  Continue Losartan for kidney protection.  Urine micro in office March 2023 =  ALB 10 and A:C <30. Continue collaboration with cardiology and endocrinology.  Consider nephrology referral if worsening function.  BMP and CBC today. ?

## 2022-02-21 NOTE — Assessment & Plan Note (Signed)
Continue collaboration with cardiology and preventative treatment. ?

## 2022-02-21 NOTE — Assessment & Plan Note (Signed)
Chronic, ongoing.  Denies SI/HI.  Continue Sertraline and Ativan.  Has had multiple attempts at reduction of benzo with no success.  Discussed at length risks with her and she is aware.  Continue Ativan and fill upon request.  PDMP reviewed.  Return in 3 months, has up to date UDS (last UDS 04/29/21) and contract. ?

## 2022-02-21 NOTE — Assessment & Plan Note (Signed)
Chronic, ongoing.  Denies SI/HI.  Continue Sertraline 150 MG daily as is ordered. This will benefit mood overall and help reduce benzo use.  Consider increase to 200 MG in future for increased benefit.  Return in 3 months. ?

## 2022-02-22 ENCOUNTER — Other Ambulatory Visit: Payer: Self-pay | Admitting: Nurse Practitioner

## 2022-02-22 DIAGNOSIS — D508 Other iron deficiency anemias: Secondary | ICD-10-CM

## 2022-02-22 LAB — CBC WITH DIFFERENTIAL/PLATELET
Basophils Absolute: 0 10*3/uL (ref 0.0–0.2)
Basos: 1 %
EOS (ABSOLUTE): 0.2 10*3/uL (ref 0.0–0.4)
Eos: 4 %
Hematocrit: 32.8 % — ABNORMAL LOW (ref 34.0–46.6)
Hemoglobin: 10.6 g/dL — ABNORMAL LOW (ref 11.1–15.9)
Immature Grans (Abs): 0 10*3/uL (ref 0.0–0.1)
Immature Granulocytes: 0 %
Lymphocytes Absolute: 2.4 10*3/uL (ref 0.7–3.1)
Lymphs: 39 %
MCH: 26.5 pg — ABNORMAL LOW (ref 26.6–33.0)
MCHC: 32.3 g/dL (ref 31.5–35.7)
MCV: 82 fL (ref 79–97)
Monocytes Absolute: 0.5 10*3/uL (ref 0.1–0.9)
Monocytes: 9 %
Neutrophils Absolute: 3 10*3/uL (ref 1.4–7.0)
Neutrophils: 47 %
Platelets: 283 10*3/uL (ref 150–450)
RBC: 4 x10E6/uL (ref 3.77–5.28)
RDW: 15 % (ref 11.7–15.4)
WBC: 6.2 10*3/uL (ref 3.4–10.8)

## 2022-02-22 LAB — BASIC METABOLIC PANEL
BUN/Creatinine Ratio: 19 (ref 12–28)
BUN: 20 mg/dL (ref 8–27)
CO2: 24 mmol/L (ref 20–29)
Calcium: 9.7 mg/dL (ref 8.7–10.3)
Chloride: 106 mmol/L (ref 96–106)
Creatinine, Ser: 1.06 mg/dL — ABNORMAL HIGH (ref 0.57–1.00)
Glucose: 195 mg/dL — ABNORMAL HIGH (ref 70–99)
Potassium: 4.6 mmol/L (ref 3.5–5.2)
Sodium: 144 mmol/L (ref 134–144)
eGFR: 54 mL/min/{1.73_m2} — ABNORMAL LOW (ref >59)

## 2022-02-22 LAB — IRON AND TIBC
Iron Saturation: 10 % — ABNORMAL LOW (ref 15–55)
Iron: 37 ug/dL (ref 27–139)
Total Iron Binding Capacity: 366 ug/dL (ref 250–450)
UIBC: 329 ug/dL (ref 118–369)

## 2022-02-22 LAB — LIPID PANEL W/O CHOL/HDL RATIO
Cholesterol, Total: 172 mg/dL (ref 100–199)
HDL: 67 mg/dL (ref >39)
LDL Chol Calc (NIH): 72 mg/dL (ref 0–99)
Triglycerides: 203 mg/dL — ABNORMAL HIGH (ref 0–149)
VLDL Cholesterol Cal: 33 mg/dL (ref 5–40)

## 2022-02-22 LAB — VITAMIN B12: Vitamin B-12: 1567 pg/mL — ABNORMAL HIGH (ref 232–1245)

## 2022-02-22 LAB — FERRITIN: Ferritin: 17 ng/mL (ref 15–150)

## 2022-02-22 NOTE — Progress Notes (Signed)
Contacted via MyChart -- 6 weeks lab visit only ? ? ?Good morning Laura Rojas, your labs have returned: ?- Kidney function, creatinine and eGFR, continues to show mild stage 3a kidney disease which we will continue to monitor closely.  Ensure plenty of water intake at home. ?- CBC and iron show some anemia present -- hemoglobin lower this check as is hematocrit + your iron level has come down some from last check.  Are you taking iron daily?  Let me know.  If not ensure you start taking Vitron (iron supplement) or Slow Fe one tablet daily.  I would like you to return in 6 weeks to recheck this, sooner if worsening fatigue. ?- B12 level is above goal, cut back on B12 supplement to every other day. ?Any questions? Staff will call to schedule lab only visit.   ?Keep being amazing!!  Thank you for allowing me to participate in your care.  I appreciate you. ?Kindest regards, ?Dhriti Fales ?

## 2022-02-23 ENCOUNTER — Telehealth: Payer: Self-pay | Admitting: Nurse Practitioner

## 2022-02-23 MED ORDER — IRON-VITAMIN C 65-125 MG PO TABS: 1.0000 | ORAL_TABLET | Freq: Every day | ORAL | 4 refills | Status: DC

## 2022-02-23 NOTE — Telephone Encounter (Signed)
Pt ws to let Laura Rojas know if she is taking any iron supplements. ?Pt is NOT taking any iron pills. ?

## 2022-02-23 NOTE — Telephone Encounter (Signed)
Left a message for patient to give our office a call back to discuss. ? ?Dilkon for pec/nurse triage to give note if patient calls back.  ?

## 2022-02-24 NOTE — Telephone Encounter (Signed)
Patient is aware will pick up rx.  ?

## 2022-02-28 IMAGING — DX DG CHEST 2V
2 series · 2 of 2 positions shown · non-contrast
Comparison: 09/02/2014.

CLINICAL DATA: ongoing cough post Covid

EXAM:
CHEST - 2 VIEW

[chest pa]
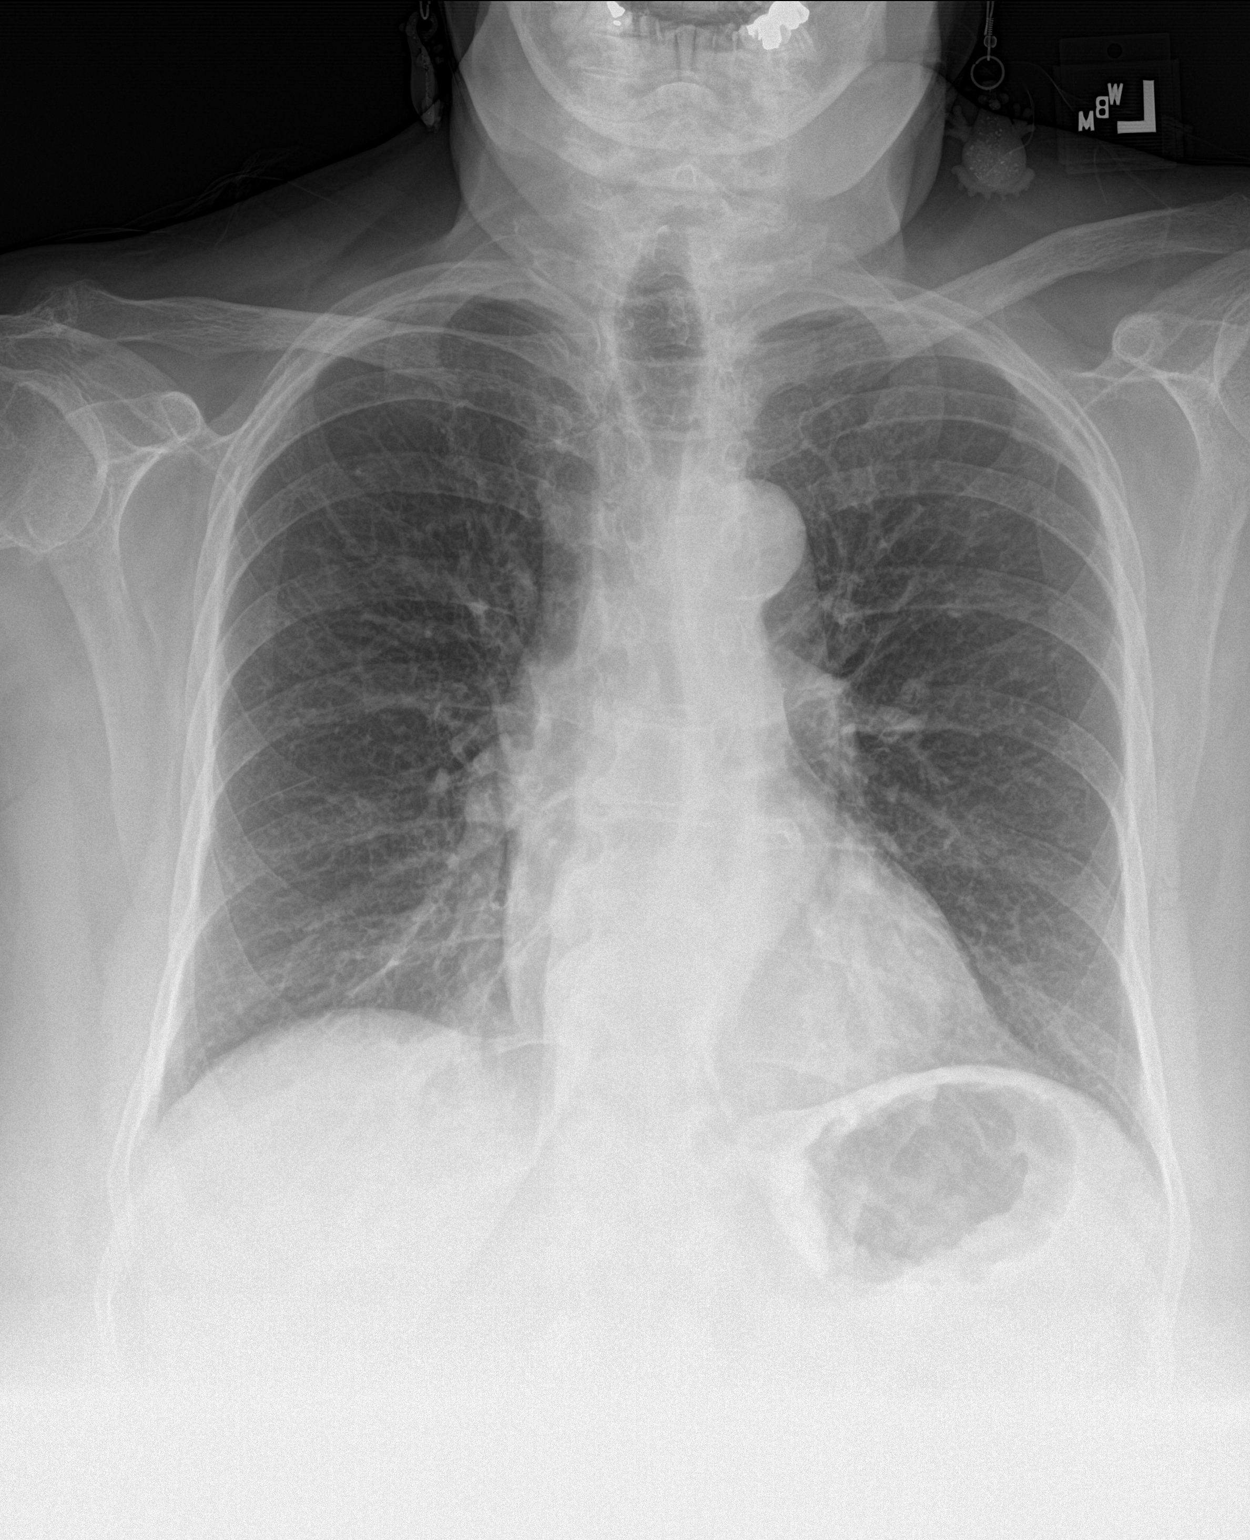

[chest lat]
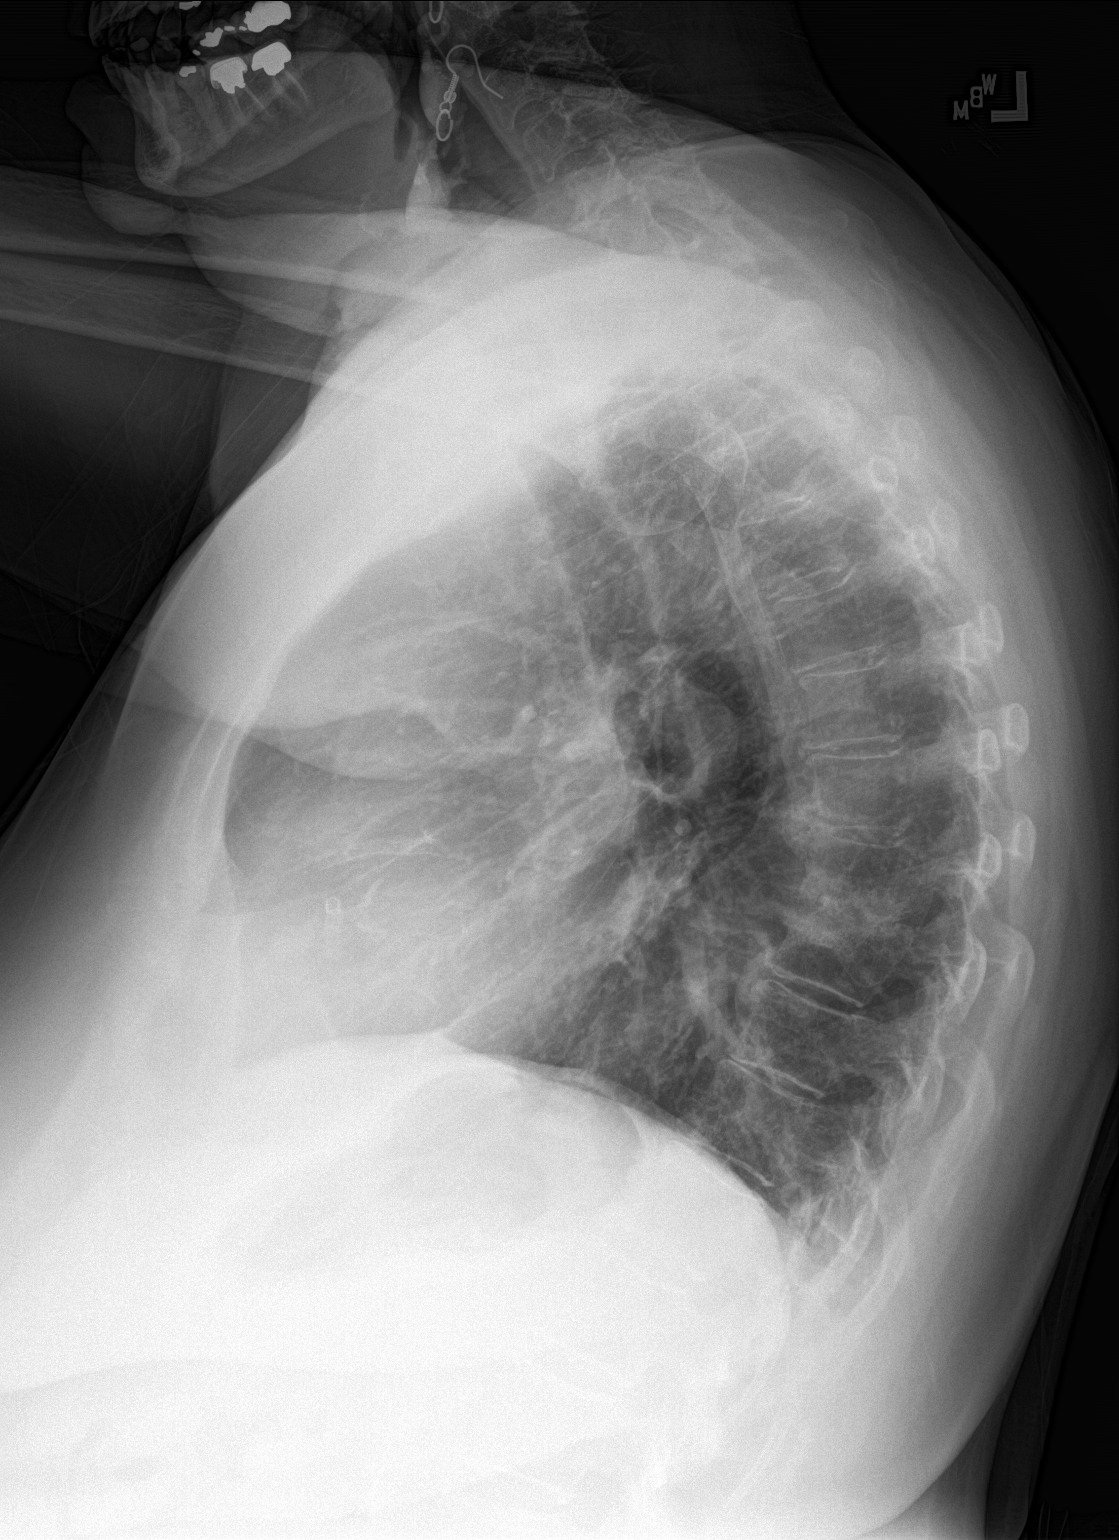

[2 of 2 positions shown; findings below may reference images not displayed]

FINDINGS: No consolidation. No visible pleural effusions or pneumothorax.
Similar cardiomediastinal silhouette and tortuous aorta. Coronary
artery stent. Calcific atherosclerosis of the aorta. Progressive
severe degenerative disc height loss and endplate
sclerosis/irregularity at a level in the mid to lower thoracic
spine.
IMPRESSION: 1. No active cardiopulmonary disease.
2. Progressive severe disc height loss and endplate
sclerosis/irregularity at a mid to lower thoracic spine level. This
most likely is degenerative in etiology; however,
discitis/osteomyelitis could have a similar appearance. If there is
concern for infection, MRI of the thoracic spine with contrast could
further characterize.

## 2022-03-14 DIAGNOSIS — M0609 Rheumatoid arthritis without rheumatoid factor, multiple sites: Secondary | ICD-10-CM | POA: Diagnosis not present

## 2022-03-25 NOTE — Patient Instructions (Signed)

## 2022-03-28 ENCOUNTER — Encounter: Payer: Self-pay | Admitting: Nurse Practitioner

## 2022-03-28 ENCOUNTER — Ambulatory Visit (INDEPENDENT_AMBULATORY_CARE_PROVIDER_SITE_OTHER): Payer: Medicare Other | Admitting: Nurse Practitioner

## 2022-03-28 VITALS — BP 131/74 | HR 62 | Temp 97.6°F | Ht 65.0 in | Wt 161.8 lb

## 2022-03-28 DIAGNOSIS — D508 Other iron deficiency anemias: Secondary | ICD-10-CM

## 2022-03-28 DIAGNOSIS — Z794 Long term (current) use of insulin: Secondary | ICD-10-CM

## 2022-03-28 DIAGNOSIS — E1165 Type 2 diabetes mellitus with hyperglycemia: Secondary | ICD-10-CM

## 2022-03-28 MED ORDER — TRIAMCINOLONE ACETONIDE 0.1 % EX CREA: 1.0000 "application " | TOPICAL_CREAM | Freq: Two times a day (BID) | CUTANEOUS | 2 refills | Status: DC

## 2022-03-28 NOTE — Assessment & Plan Note (Addendum)
Ongoing, continue daily supplement and check iron/ferritin + CBC today + FOBT.  Lower hemoglobin this past check and having some fatigue.  Consider GI or hematology referral if ongoing. ?

## 2022-03-28 NOTE — Assessment & Plan Note (Signed)
Chronic, ongoing, followed by endocrinology in past with A1c 7.9% recent visit, trending down.  She wishes to avoid return to endo at this time as her previous provider left.  Will continue Januvia 100 MG daily (renal dose as needed) + recommend she continue Basaglar at 10 units + cut back on her sliding scale with this, continue Metformin.  Continue collaboration with CCM team in office.   Bring Libre to next visit.  Return to office in 8 weeks.  Recheck CMP today. ?

## 2022-03-28 NOTE — Progress Notes (Addendum)
BP 131/74   Pulse 62   Temp 97.6 F (36.4 C) (Oral)   Ht 5' 5"  (1.651 m)   Wt 161 lb 12.8 oz (73.4 kg)   SpO2 96%   BMI 26.92 kg/m    Subjective:    Patient ID: Laura Rojas, female    DOB: 11-17-1946, 76 y.o.   MRN: [id number]  HPI: [name] is a 76 y.o. female  Chief Complaint  Patient presents with   Diabetes    Patient states everything is going pretty good. Patient states sometimes throughout the day she has episodes of feeling extremely tired and weak. Patient states when she takes a nap she will feel a little better.    DIABETES Follow-up for starting Januvia last visit with A1c 7.9% -- is tolerating well.  Was followed by endocrinology and last saw NP [facility] 05/16/21, however had not returned as NP no longer at the practice.  She continues on Basaglar, Novolog sliding scale, and Metformin. Wilder Glade made her too fatigued and she has stopped this.  She did not tolerate GLP1 (Trulicity) in past, caused GI issues.     Does endorse occasional poor diet -- does enjoy sweets. Hypoglycemic episodes: a few below 70 Polydipsia/polyuria: no Visual disturbance: no Chest pain: no Paresthesias: no Glucose Monitoring: yes             Accucheck frequency: Daily --- Libre             Fasting glucose: 150-170 something             Post prandial:             Evening: 150-170 something             Before meals: Taking Insulin?: yes             Long acting insulin: 8-10 units              Short acting insulin: sliding scale units with meals (16-24 units) Blood Pressure Monitoring: rarely Retinal Examination: Up to Date Foot Exam: Up to Date Pneumovax: Up to Date Influenza: Up to Date Aspirin: yes   ANEMIA Currently is taking oral B12.  Recent labs did note a mild reduction in hemoglobin and hematocrit.  Is taking iron supplement at this time, started recent visit.   Anemia status: stable Etiology of anemia:unknown Duration of anemia treatment: month Compliance with  treatment: good compliance Iron supplementation side effects: no Severity of anemia: mild Fatigue: yes Decreased exercise tolerance: no  Dyspnea on exertion: no Palpitations: no Bleeding: no Pica: no   Relevant past medical, surgical, family and social history reviewed and updated as indicated. Interim medical history since our last visit reviewed. Allergies and medications reviewed and updated.  Review of Systems  Constitutional:  Positive for fatigue. Negative for activity change, appetite change, diaphoresis and fever.  Respiratory:  Negative for cough, chest tightness and shortness of breath.   Cardiovascular:  Negative for chest pain, palpitations and leg swelling.  Gastrointestinal: Negative.   Endocrine: Negative for polydipsia, polyphagia and polyuria.  Neurological: Negative.   Psychiatric/Behavioral: Negative.      Per HPI unless specifically indicated above     Objective:    BP 131/74   Pulse 62   Temp 97.6 F (36.4 C) (Oral)   Ht 5' 5"  (1.651 m)   Wt 161 lb 12.8 oz (73.4 kg)   SpO2 96%   BMI 26.92 kg/m   Wt Readings from Last 3  Encounters:  04/03/22 160 lb 15 oz (73 kg)  03/28/22 161 lb 12.8 oz (73.4 kg)  02/21/22 161 lb 3.2 oz (73.1 kg)    Physical Exam Vitals and nursing note reviewed.  Constitutional:      General: She is awake. She is not in acute distress.    Appearance: She is well-developed, well-groomed and overweight. She is not ill-appearing.  HENT:     Head: Normocephalic.     Right Ear: Hearing normal.     Left Ear: Hearing normal.  Eyes:     General: Lids are normal.        Right eye: No discharge.        Left eye: No discharge.     Conjunctiva/sclera: Conjunctivae normal.     Pupils: Pupils are equal, round, and reactive to light.  Neck:     Thyroid: No thyromegaly.     Vascular: Carotid bruit (bilateral) present.  Cardiovascular:     Rate and Rhythm: Normal rate and regular rhythm.     Heart sounds: Normal heart sounds. No  murmur heard.    No gallop.  Pulmonary:     Effort: Pulmonary effort is normal. No accessory muscle usage or respiratory distress.     Breath sounds: No wheezing or rhonchi.  Abdominal:     General: Bowel sounds are normal.     Palpations: Abdomen is soft.  Musculoskeletal:     Cervical back: Normal range of motion and neck supple.     Right lower leg: No edema.     Left lower leg: No edema.  Skin:    General: Skin is warm and dry.  Neurological:     Mental Status: She is alert and oriented to person, place, and time.  Psychiatric:        Attention and Perception: Attention normal.        Mood and Affect: Mood normal.        Speech: Speech normal.        Behavior: Behavior normal. Behavior is cooperative.        Thought Content: Thought content normal.     Results for orders placed or performed in visit on 03/28/22  Fecal occult blood, imunochemical   Specimen: Stool   ST  Result Value Ref Range   Fecal Occult Bld Negative Negative  CBC with Differential/Platelet  Result Value Ref Range   WBC 7.0 3.4 - 10.8 x10E3/uL   RBC 4.36 3.77 - 5.28 x10E6/uL   Hemoglobin 12.7 11.1 - 15.9 g/dL   Hematocrit 38.0 34.0 - 46.6 %   MCV 87 79 - 97 fL   MCH 29.1 26.6 - 33.0 pg   MCHC 33.4 31.5 - 35.7 g/dL   RDW 17.0 (H) 11.7 - 15.4 %   Platelets 236 150 - 450 x10E3/uL   Neutrophils 53 Not Estab. %   Lymphs 36 Not Estab. %   Monocytes 7 Not Estab. %   Eos 3 Not Estab. %   Basos 1 Not Estab. %   Neutrophils Absolute 3.7 1.4 - 7.0 x10E3/uL   Lymphocytes Absolute 2.5 0.7 - 3.1 x10E3/uL   Monocytes Absolute 0.5 0.1 - 0.9 x10E3/uL   EOS (ABSOLUTE) 0.2 0.0 - 0.4 x10E3/uL   Basophils Absolute 0.1 0.0 - 0.2 x10E3/uL   Immature Granulocytes 0 Not Estab. %   Immature Grans (Abs) 0.0 0.0 - 0.1 x10E3/uL  Iron Binding Cap (TIBC)([facility])  Result Value Ref Range   Total Iron Binding Capacity 332 250 - 450  ug/dL   UIBC 272 118 - 369 ug/dL   Iron 60 27 - 139 ug/dL   Iron Saturation 18  15 - 55 %  Ferritin  Result Value Ref Range   Ferritin 39 15 - 150 ng/mL  Comprehensive metabolic panel  Result Value Ref Range   Glucose 99 70 - 99 mg/dL   BUN 20 8 - 27 mg/dL   Creatinine, Ser 1.06 (H) 0.57 - 1.00 mg/dL   eGFR 54 (L) >59 mL/min/1.73   BUN/Creatinine Ratio 19 12 - 28   Sodium 142 134 - 144 mmol/L   Potassium 4.2 3.5 - 5.2 mmol/L   Chloride 105 96 - 106 mmol/L   CO2 24 20 - 29 mmol/L   Calcium 10.0 8.7 - 10.3 mg/dL   Total Protein 6.9 6.0 - 8.5 g/dL   Albumin 4.7 3.7 - 4.7 g/dL   Globulin, Total 2.2 1.5 - 4.5 g/dL   Albumin/Globulin Ratio 2.1 1.2 - 2.2   Bilirubin Total 0.3 0.0 - 1.2 mg/dL   Alkaline Phosphatase 151 (H) 44 - 121 IU/L   AST 20 0 - 40 IU/L   ALT 21 0 - 32 IU/L      Assessment & Plan:   Problem List Items Addressed This Visit       Endocrine   Type 2 diabetes mellitus with hyperglycemia, with long-term current use of insulin (HCC) - Primary    Chronic, ongoing, followed by endocrinology in past with A1c 7.9% recent visit, trending down.  She wishes to avoid return to endo at this time as her previous provider left.  Will continue Januvia 100 MG daily (renal dose as needed) + recommend she continue Basaglar at 10 units + cut back on her sliding scale with this, continue Metformin.  Continue collaboration with CCM team in office.   Bring Libre to next visit.  Return to office in 8 weeks.  Recheck CMP today.      Relevant Orders   Comprehensive metabolic panel (Completed)     Other   Fe deficiency anemia    Ongoing, continue daily supplement and check iron/ferritin + CBC today + FOBT.  Lower hemoglobin this past check and having some fatigue.  Consider GI or hematology referral if ongoing.      Relevant Orders   Fecal occult blood, imunochemical (Completed)   CBC with Differential/Platelet (Completed)   Iron Binding Cap (TIBC)([facility]) (Completed)   Ferritin (Completed)   Comprehensive metabolic panel (Completed)     Follow up  plan: Return in about 2 months (around 05/28/2022) for T2DM, HTN/HLD, MOOD, DEPRESSION, CKD, POLYMYALGIA RA.

## 2022-03-29 ENCOUNTER — Ambulatory Visit: Payer: Self-pay

## 2022-03-29 DIAGNOSIS — D508 Other iron deficiency anemias: Secondary | ICD-10-CM | POA: Diagnosis not present

## 2022-03-29 LAB — CBC WITH DIFFERENTIAL/PLATELET
Basophils Absolute: 0.1 10*3/uL (ref 0.0–0.2)
Basos: 1 %
EOS (ABSOLUTE): 0.2 10*3/uL (ref 0.0–0.4)
Eos: 3 %
Hematocrit: 38 % (ref 34.0–46.6)
Hemoglobin: 12.7 g/dL (ref 11.1–15.9)
Immature Grans (Abs): 0 10*3/uL (ref 0.0–0.1)
Immature Granulocytes: 0 %
Lymphocytes Absolute: 2.5 10*3/uL (ref 0.7–3.1)
Lymphs: 36 %
MCH: 29.1 pg (ref 26.6–33.0)
MCHC: 33.4 g/dL (ref 31.5–35.7)
MCV: 87 fL (ref 79–97)
Monocytes Absolute: 0.5 10*3/uL (ref 0.1–0.9)
Monocytes: 7 %
Neutrophils Absolute: 3.7 10*3/uL (ref 1.4–7.0)
Neutrophils: 53 %
Platelets: 236 10*3/uL (ref 150–450)
RBC: 4.36 x10E6/uL (ref 3.77–5.28)
RDW: 17 % — ABNORMAL HIGH (ref 11.7–15.4)
WBC: 7 10*3/uL (ref 3.4–10.8)

## 2022-03-29 LAB — IRON AND TIBC
Iron Saturation: 18 % (ref 15–55)
Iron: 60 ug/dL (ref 27–139)
Total Iron Binding Capacity: 332 ug/dL (ref 250–450)
UIBC: 272 ug/dL (ref 118–369)

## 2022-03-29 LAB — COMPREHENSIVE METABOLIC PANEL
ALT: 21 IU/L (ref 0–32)
AST: 20 IU/L (ref 0–40)
Albumin/Globulin Ratio: 2.1 (ref 1.2–2.2)
Albumin: 4.7 g/dL (ref 3.7–4.7)
Alkaline Phosphatase: 151 IU/L — ABNORMAL HIGH (ref 44–121)
BUN/Creatinine Ratio: 19 (ref 12–28)
BUN: 20 mg/dL (ref 8–27)
Bilirubin Total: 0.3 mg/dL (ref 0.0–1.2)
CO2: 24 mmol/L (ref 20–29)
Calcium: 10 mg/dL (ref 8.7–10.3)
Chloride: 105 mmol/L (ref 96–106)
Creatinine, Ser: 1.06 mg/dL — ABNORMAL HIGH (ref 0.57–1.00)
Globulin, Total: 2.2 g/dL (ref 1.5–4.5)
Glucose: 99 mg/dL (ref 70–99)
Potassium: 4.2 mmol/L (ref 3.5–5.2)
Sodium: 142 mmol/L (ref 134–144)
Total Protein: 6.9 g/dL (ref 6.0–8.5)
eGFR: 54 mL/min/{1.73_m2} — ABNORMAL LOW (ref >59)

## 2022-03-29 LAB — FERRITIN: Ferritin: 39 ng/mL (ref 15–150)

## 2022-03-29 NOTE — Telephone Encounter (Signed)
Pt called, advised her of the lotion for diabetes. Pt was unsure of the name. I reviewed the OV note from yesterday and didn't see anything about the lotion so sent Henrine Screws, NP a teams message and she gave me the Eucerin lotion name that I advised the patient of. She was grateful for [facility] responding and me assisting. No further assistance needed.  ?

## 2022-03-29 NOTE — Telephone Encounter (Signed)
Patient called, left VM to return the call to the office to discuss lotion with a nurse.  ? ?Summary: Question for nurse, regarding visit yesterday  ? Pt wants to speak to a nurse she has a question about her visit with PCP yesterday, Jolene recommended a lotion for diabetes and she cannot remember the name.  ? ?Best contact: [phone]   ?  ? ?

## 2022-03-30 NOTE — Progress Notes (Signed)
Contacted via [place] ? ? ?Good afternoon Laura Rojas, your labs have returned: ?- CBC is stable this check with normal hemoglobin and hematocrit + iron level is continuing to trend up.  Please continue daily supplement and we will see what stool sample shows. ?- Kidney function, creatinine and eGFR, continues to show mild kidney disease but no worsening.  We will continue to monitor this.  Liver function is normal, AST and ALT.  Overall good labs. ?Keep being stellar!!  Thank you for allowing me to participate in your care.  I appreciate you. ?Kindest regards, ?Shaniya Tashiro ? ?

## 2022-03-31 LAB — FECAL OCCULT BLOOD, IMMUNOCHEMICAL: Fecal Occult Bld: NEGATIVE

## 2022-03-31 NOTE — Progress Notes (Signed)
Contacted via [place] ? ? ?Stool sample returned negative for blood!!  Great news!!

## 2022-04-03 ENCOUNTER — Other Ambulatory Visit: Payer: Self-pay

## 2022-04-03 ENCOUNTER — Ambulatory Visit: Payer: Self-pay | Admitting: *Deleted

## 2022-04-03 ENCOUNTER — Emergency Department: Payer: Medicare Other

## 2022-04-03 ENCOUNTER — Emergency Department
Admission: EM | Admit: 2022-04-03 | Discharge: 2022-04-03 | Disposition: A | Discharge status: Home / Self Care | Payer: Medicare Other | Attending: Emergency Medicine | Admitting: Emergency Medicine

## 2022-04-03 DIAGNOSIS — M47812 Spondylosis without myelopathy or radiculopathy, cervical region: Secondary | ICD-10-CM | POA: Diagnosis not present

## 2022-04-03 DIAGNOSIS — I129 Hypertensive chronic kidney disease with stage 1 through stage 4 chronic kidney disease, or unspecified chronic kidney disease: Secondary | ICD-10-CM | POA: Diagnosis not present

## 2022-04-03 DIAGNOSIS — S0990XA Unspecified injury of head, initial encounter: Secondary | ICD-10-CM | POA: Diagnosis not present

## 2022-04-03 DIAGNOSIS — Y92009 Unspecified place in unspecified non-institutional (private) residence as the place of occurrence of the external cause: Secondary | ICD-10-CM | POA: Insufficient documentation

## 2022-04-03 DIAGNOSIS — Z7901 Long term (current) use of anticoagulants: Secondary | ICD-10-CM | POA: Diagnosis not present

## 2022-04-03 DIAGNOSIS — N189 Chronic kidney disease, unspecified: Secondary | ICD-10-CM | POA: Diagnosis not present

## 2022-04-03 DIAGNOSIS — J329 Chronic sinusitis, unspecified: Secondary | ICD-10-CM | POA: Diagnosis not present

## 2022-04-03 DIAGNOSIS — W01198A Fall on same level from slipping, tripping and stumbling with subsequent striking against other object, initial encounter: Secondary | ICD-10-CM | POA: Diagnosis not present

## 2022-04-03 DIAGNOSIS — W19XXXA Unspecified fall, initial encounter: Secondary | ICD-10-CM

## 2022-04-03 DIAGNOSIS — I2510 Atherosclerotic heart disease of native coronary artery without angina pectoris: Secondary | ICD-10-CM | POA: Diagnosis not present

## 2022-04-03 NOTE — ED Triage Notes (Signed)
Pt reports she had a trip and fall yesterday; causing her to hit her head. Right side of her head hit a wooden bench. Pt takes blood thinners. Went to [facility], but they were unable to do any imaging. Ambulates safely. ?Pt alert and oriented X4, cooperative, RR even and unlabored, color WNL. Pt in NAD. ? ?

## 2022-04-03 NOTE — ED Provider Notes (Signed)
? ? ?[facility] ?Emergency Department Provider Note ? ? ? ? Event Date/Time  ? First MD Initiated Contact with Patient 04/03/22 1121   ?  (approximate) ? ? ?History  ? ?Fall ? ? ?HPI ? ?Laura Rojas is a 76 y.o. female with a history of hypertension, CKD, and CAD-status post stent placement on anticoagulation therapy, presents to the ED after a mechanical fall yesterday.  Patient reports a fall yesterday where she tripped hitting her head on a wooden bench outside on her deck.  She denies any LOC at that time.  She presented to [facility] today for evaluation, and was advised to report to the ED for imaging.  She denies any other serious injury related to the fall. ? ? ?Physical Exam  ? ?Triage Vital Signs: ?ED Triage Vitals [04/03/22 1046]  ?Enc Vitals Group  ?   BP (!) 173/86  ?   Pulse Rate 62  ?   Resp 16  ?   Temp 97.9 ?F (36.6 ?C)  ?   Temp Source Oral  ?   SpO2 96 %  ?   Weight 160 lb 15 oz (73 kg)  ?   Height '5\' 5"'$  (1.651 m)  ?   Head Circumference   ?   Peak Flow   ?   Pain Score 0  ?   Pain Loc   ?   Pain Edu?   ?   Excl. in [place]?   ? ? ?Most recent vital signs: ?Vitals:  ? 04/03/22 1046  ?BP: (!) 173/86  ?Pulse: 62  ?Resp: 16  ?Temp: 97.9 ?F (36.6 ?C)  ?SpO2: 96%  ? ? ?General Awake, no distress. A&O x 4 ?HEENT NCAT. PERRL. EOMI. No rhinorrhea. Mucous membranes are moist.  ?CV:  Good peripheral perfusion.  ?RESP:  Normal effort. CTA ?ABD:  No distention.  ?MSK:  Normal spinal alignment without midline tenderness, spasm, deformity, or step-off.  Active range of motion in all extremities ? ? ?ED Results / Procedures / Treatments  ? ?Labs ?(all labs ordered are listed, but only abnormal results are displayed) ?Labs Reviewed - No data to display ? ? ?EKG ? ? ?RADIOLOGY ? ?I personally viewed and evaluated these images as part of my medical decision making, as well as reviewing the written report by the radiologist. ? ?ED Provider Interpretation: no acute findings} ? ?CT Head Wo  Contrast ? ?Result Date: 04/03/2022 ?CLINICAL DATA:  Trauma, fall EXAM: CT HEAD WITHOUT CONTRAST TECHNIQUE: Contiguous axial images were obtained from the base of the skull through the vertex without intravenous contrast. RADIATION DOSE REDUCTION: This exam was performed according to the departmental dose-optimization program which includes automated exposure control, adjustment of the mA and/or kV according to patient size and/or use of iterative reconstruction technique. COMPARISON:  None Available. FINDINGS: Brain: No acute intracranial findings are seen. There are no signs of bleeding. Cortical sulci are prominent. Vascular: Unremarkable. Skull: No fracture is seen in the calvarium. Sinuses/Orbits: There is mild mucosal thickening in the ethmoid and left side of sphenoid sinuses. Other: None IMPRESSION: No acute intracranial findings are seen.  Atrophy. Chronic sinusitis. Electronically Signed   By: Elmer Picker M.D.   On: 04/03/2022 11:06  ? ?CT Cervical Spine Wo Contrast ? ?Result Date: 04/03/2022 ?CLINICAL DATA:  Trauma, fall EXAM: CT CERVICAL SPINE WITHOUT CONTRAST TECHNIQUE: Multidetector CT imaging of the cervical spine was performed without intravenous contrast. Multiplanar CT image reconstructions were also generated. RADIATION DOSE REDUCTION: This  exam was performed according to the departmental dose-optimization program which includes automated exposure control, adjustment of the mA and/or kV according to patient size and/or use of iterative reconstruction technique. COMPARISON:  None Available. FINDINGS: Alignment: There is minimal anterolisthesis at C4-C5 level which may be due to previous ligament injury and facet degeneration. There is reversal of lordosis. Skull base and vertebrae: Severe degenerative changes are noted in the atlantoaxial joint. No recent fracture is seen. There is 10 mm smooth marginated calcification posterior to the spinous processes of C5 and C6 vertebrae, possibly residual  from previous soft tissue injury and ligament calcification. Soft tissues and spinal canal: There is extrinsic pressure over the ventral margin of thecal sac caused by posterior bony spurs from C5-T1 levels with possible mild spinal stenosis. Disc levels: There is encroachment of neural foramina from C2-T1 levels. Upper chest: Unremarkable. Other: Prevertebral soft tissues are unremarkable. IMPRESSION: No recent fracture is seen in the cervical spine. Minimal anterolisthesis at C4-C5 level may be residual from previous ligament injury and facet degeneration. Cervical spondylosis with encroachment of neural foramina from C2-T1 levels. Electronically Signed   By: Elmer Picker M.D.   On: 04/03/2022 11:11   ? ? ?PROCEDURES: ? ?Critical Care performed: No ? ?Procedures ? ? ?MEDICATIONS ORDERED IN ED: ?Medications - No data to display ? ? ?IMPRESSION / MDM / ASSESSMENT AND PLAN / ED COURSE  ?I reviewed the triage vital signs and the nursing notes. ?             ?               ? ?Differential diagnosis includes, but is not limited to, facial contusion, head contusion, SDH, concussion, cervical strain, cervical fracture, radiculopathy ? ?Geriatric patient to the ED for evaluation of injury sustained following mechanical fall at home yesterday.  Patient presents after initially presented to [facility] in no acute distress.  Patient presents today at the advice of her PCP for imaging related to the fall given her blood thinner use.  Patient denies any serious head injury, LOC, or weakness.  Exam is reassuring as it shows no acute neuro muscle deficits or signs of a closed head injury.  CT imaging of the head and neck are negative for any acute intracranial processes or fractures, based on my review and radiology report.  Patient's diagnosis is consistent with fall at home with head contusion, without LOC. Patient will be discharged home with directions to take OTC Tylenol as needed. Patient is to follow up with primary  provider as needed or otherwise directed. Patient is given ED precautions to return to the ED for any worsening or new symptoms. ? ? ?FINAL CLINICAL IMPRESSION(S) / ED DIAGNOSES  ? ?Final diagnoses:  ?Fall in home, initial encounter  ?Minor head injury, initial encounter  ? ? ? ?Rx / DC Orders  ? ?ED Discharge Orders   ? ? None  ? ?  ? ? ? ?Note:  This document was prepared using Dragon voice recognition software and may include unintentional dictation errors. ? ?  ?Melvenia Needles, PA-C ?04/03/22 1139 ? ?  ?Vladimir Crofts, MD ?04/03/22 1300 ? ?

## 2022-04-03 NOTE — Discharge Instructions (Addendum)
Your exam and CT scans are negative and reassuring at this time.  No sign of a serious head injury following your fall at home.  You may take over-the-counter Tylenol as needed for pain.  Follow-up with your primary provider return to the ED if needed. ?

## 2022-04-03 NOTE — Telephone Encounter (Signed)
?  Chief Complaint: Fall ?Symptoms: [place] on right hip,hit head on corner of bench. Head and hip sore, 5/10. Normal ROM, can ambulate and bear weight, no pain. "Hip a little sore, mostly my head, no bump there though." ?Frequency: yesterday morning ?Pertinent Negatives: Patient denies headache, dizziness, lacerations, ?Disposition: '[x]'$ ED /'[]'$ Urgent Care (no appt availability in office) / '[]'$ Appointment(In office/virtual)/ '[]'$  [place] [facility]/ '[]'$ Home Care/ '[]'$ Refused Recommended Disposition /'[]'$ [facility] [facility]/ '[]'$  Follow-up with PCP ?Additional Notes: Pt is on Plavix. Advised ED.  States will follow disposition.  ? Reason for Disposition ? Injury (or injuries) that need emergency care ?   On Plavix ? ?Answer Assessment - Initial Assessment Questions ?1. MECHANISM: "How did the fall happen?" ?    Dog jumped up on her ?2. DOMESTIC VIOLENCE AND ELDER ABUSE SCREENING: "Did you fall because someone pushed you or tried to hurt you?" If Yes, ask: "Are you safe now?" ?    NA ?3. ONSET: "When did the fall happen?" (e.g., minutes, hours, or days ago) ?    Yesterday AM ?4. LOCATION: "What part of the body hit the ground?" (e.g., back, buttocks, head, hips, knees, hands, head, stomach) ?    Right hip first, head hot bench corner ?5. INJURY: "Did you hurt (injure) yourself when you fell?" If Yes, ask: "What did you injure? Tell me more about this?" (e.g., body area; type of injury; pain severity)" ?     ?6. PAIN: "Is there any pain?" If Yes, ask: "How bad is the pain?" (e.g., Scale 1-10; or mild,  ?moderate, severe) ?  - NONE (0): No pain ?  - MILD (1-3): Doesn't interfere with normal activities  ?  - MODERATE (4-7): Interferes with normal activities or awakens from sleep  ?  - SEVERE (8-10): Excruciating pain, unable to do any normal activities  ?    5/10 ?7. SIZE: For cuts, bruises, or swelling, ask: "How large is it?" (e.g., inches or centimeters)  ?    None ? ?9. OTHER SYMPTOMS: "Do you have any other symptoms?"  (e.g., dizziness, fever, weakness; new onset or worsening).  ?    None ?10. CAUSE: "What do you think caused the fall (or falling)?" (e.g., tripped, dizzy spell) ?     Dog ? ?Protocols used: Falls and Falling-A-AH ? ?

## 2022-04-03 NOTE — ED Notes (Addendum)
Says she fell yesterday on her head--hit it on a table. No loc.  Is on plavix and aspirin.  Went to [facility] and they could not do imaging.  Says slight headache right lower head.  "Sore"  she fell because she was going down one step to garage--the dog and her husband were on step at same time trying to stop the dog from jumping on her. -there was not room for all of them and she fell off.  ?

## 2022-04-05 ENCOUNTER — Telehealth: Payer: Self-pay | Admitting: *Deleted

## 2022-04-05 NOTE — Telephone Encounter (Signed)
Transition Care Management Unsuccessful Follow-up Telephone Call ? ?Date of discharge and from where:  [place] 04-03-2022 ? ?Attempts:  1st Attempt ? ?Reason for unsuccessful TCM follow-up call:  Left voice message ? ?  ?

## 2022-04-10 ENCOUNTER — Ambulatory Visit: Payer: Medicare Other

## 2022-04-10 NOTE — Progress Notes (Signed)
Chronic Care Management Pharmacy Note  04/10/2022 Name:  Laura Rojas MRN:  [id number] DOB:  1946-03-14  Summary: Upcoming PCP visit 05/31/2022. Could consider ezetimibe 10 mg addition to achieve goal  04/03/22 - ED visit; mechanical fall, hit head on bench  Subjective: Laura Rojas is an 76 y.o. year old female who is a primary patient of Cannady, Barbaraann Faster, NP.  The CCM team was consulted for assistance with disease management and care coordination needs.    Engaged with patient by telephone for follow up visit in response to provider referral for pharmacy case management and/or care coordination services.   Consent to Services:  The patient was given information about Chronic Care Management services, agreed to services, and gave verbal consent prior to initiation of services.  Please see initial visit note for detailed documentation.   Patient Care Team: Venita Lick, NP as PCP - General (Nurse Practitioner) Sanda Klein, MD as PCP - Cardiology (Cardiology) Marlowe Sax, MD as Referring Physician (Internal Medicine) Warnell Forester, NP (Inactive) as Nurse Practitioner (Surgery) Madelin Rear, [facility] as Pharmacist (Pharmacist)  Hospital visits: None in previous 6 months  Objective:  Lab Results  Component Value Date   CREATININE 1.06 (H) 03/28/2022   CREATININE 1.06 (H) 02/21/2022   CREATININE 1.12 (H) 01/20/2022    Lab Results  Component Value Date   HGBA1C 7.9 (H) 02/21/2022   HGBA1C 8.4 (H) 11/25/2021   HGBA1C 8.2 (H) 08/02/2021   Last diabetic Eye exam:  Lab Results  Component Value Date/Time   HMDIABEYEEXA Retinopathy (A) 11/30/2021 12:00 AM    Last diabetic Foot exam:  Lab Results  Component Value Date/Time   HMDIABFOOTEX Normal 05/07/2018 12:00 AM        Component Value Date/Time   CHOL 172 02/21/2022 1410   CHOL 165 11/25/2021 0843   CHOL 150 08/02/2021 1348   CHOL 170 05/08/2019 0939   CHOL 157 01/23/2017 1057   CHOL 147  12/01/2015 0903   TRIG 203 (H) 02/21/2022 1410   TRIG 172 (H) 11/25/2021 0843   TRIG 201 (H) 08/02/2021 1348   TRIG 367 (H) 05/08/2019 0939   TRIG 252 (H) 01/23/2017 1057   TRIG 179 (H) 12/01/2015 0903   HDL 67 02/21/2022 1410   HDL 73 11/25/2021 0843   HDL 62 08/02/2021 1348   CHOLHDL 2.9 08/28/2018 1054   VLDL 73 (H) 05/08/2019 0939   LDLCALC 72 02/21/2022 1410   LDLCALC 64 11/25/2021 0843   LDLCALC 56 08/02/2021 1348       Latest Ref Rng & Units 03/28/2022    1:31 PM 11/25/2021    8:43 AM 01/11/2021    9:20 AM  Hepatic Function  Total Protein 6.0 - 8.5 g/dL 6.9   6.7   6.6    Albumin 3.7 - 4.7 g/dL 4.7   4.3   4.4    AST 0 - 40 IU/L 20   24   23     ALT 0 - 32 IU/L 21   29   22     Alk Phosphatase 44 - 121 IU/L 151   217   146    Total Bilirubin 0.0 - 1.2 mg/dL 0.3   0.3   0.3      Lab Results  Component Value Date/Time   TSH 1.360 01/20/2022 01:44 PM   TSH 2.660 07/07/2020 02:02 PM       Latest Ref Rng & Units 03/28/2022    1:31 PM 02/21/2022  2:10 PM 11/25/2021    8:43 AM  CBC  WBC 3.4 - 10.8 x10E3/uL 7.0   6.2   6.2    Hemoglobin 11.1 - 15.9 g/dL 12.7   10.6   11.6    Hematocrit 34.0 - 46.6 % 38.0   32.8   36.5    Platelets 150 - 450 x10E3/uL 236   283   316      Lab Results  Component Value Date/Time   VD25OH 62.7 11/25/2021 08:43 AM   VD25OH 32.0 12/17/2019 01:26 PM    Clinical ASCVD:  The 10-year ASCVD risk score (Arnett DK, et al., 2019) is: 61.2%   Values used to calculate the score:     Age: 54 years     Sex: Female     Is Non-Hispanic African American: No     Diabetic: Yes     Tobacco smoker: No     Systolic Blood Pressure: 182 mmHg     Is BP treated: Yes     HDL Cholesterol: 67 mg/dL     Total Cholesterol: 172 mg/dL   Social History   Tobacco Use  Smoking Status Never  Smokeless Tobacco Never   BP Readings from Last 3 Encounters:  04/03/22 (!) 173/86  03/28/22 131/74  02/21/22 108/72   Pulse Readings from Last 3 Encounters:   04/03/22 62  03/28/22 62  02/21/22 60   Wt Readings from Last 3 Encounters:  04/03/22 160 lb 15 oz (73 kg)  03/28/22 161 lb 12.8 oz (73.4 kg)  02/21/22 161 lb 3.2 oz (73.1 kg)   BMI Readings from Last 3 Encounters:  04/03/22 26.78 kg/m  03/28/22 26.92 kg/m  02/21/22 26.83 kg/m    Assessment: Review of patient past medical history, allergies, medications, health status, including review of consultants reports, laboratory and other test data, was performed as part of comprehensive evaluation and provision of chronic care management services.   SDOH:  (Social Determinants of Health) assessments and interventions performed: Yes   CCM Care Plan  Allergies  Allergen Reactions   Compazine [Prochlorperazine Edisylate] Other (See Comments)    choking   Altace [Ramipril]    Crestor [Rosuvastatin Calcium] Other (See Comments)    Myalgias   Reglan [Metoclopramide]    Sulfa Antibiotics Other (See Comments)    headache   Actos [Pioglitazone] Other (See Comments)    Hot Flashes   Amlodipine Swelling   Latex Rash    (Bandaids only )   Lipitor [Atorvastatin] Other (See Comments)    Cramps    Medications Reviewed Today     Reviewed by Arlyce Harman, RN (Registered Nurse) on 04/03/22 at 1125  Med List Status: <None>   Medication Order Taking? Sig Documenting Provider Last Dose Status Informant  albuterol (VENTOLIN HFA) 108 (90 Base) MCG/ACT inhaler [id number]  TAKE 2 PUFFS BY MOUTH EVERY 6 HOURS AS NEEDED FOR WHEEZE OR SHORTNESS OF BREATH Cannady, Jolene T, NP  Active   amLODipine (NORVASC) 2.5 MG tablet [id number]  Take 1 tablet (2.5 mg total) by mouth daily. [name] T, NP  Active   aspirin 81 MG tablet [id number]  Take 81 mg by mouth daily. [provider]  Active Self  atorvastatin (LIPITOR) 40 MG tablet [id number]  TAKE 1 TABLET BY MOUTH EVERY DAY** STOP THE SIMVASTATIN Cannady, Jolene T, NP  Active   BD PEN NEEDLE NANO U/F 32G X 4 MM MISC [id number]  SMARTSIG:1  Each SUB-Q 4 Times Daily [provider]  Active  certolizumab pegol (CIMZIA) 2 X 200 MG/ML PSKT [id number]  Inject into the skin. [provider]  Active   cholecalciferol (VITAMIN D3) 25 MCG (1000 UT) tablet [id number]  Take 1,000 Units by mouth daily. [provider]  Active   clobetasol cream (TEMOVATE) 0.05 % [id number]  Spot treat affected areas twice a day until improved. Avoid face, groin, axilla. Brendolyn Patty, MD  Active   clopidogrel (PLAVIX) 75 MG tablet [id number]  Take 1 tablet (75 mg total) by mouth daily. [name] T, NP  Active   Continuous Blood Gluc Sensor (FREESTYLE LIBRE 14 DAY SENSOR) [place] [id number]  Use 1 kit every 14 (fourteen) days E11.65 [provider]  Active   folic acid (FOLVITE) 1 MG tablet [id number]  folic acid 1 mg tablet  TAKE 1 TABLET BY MOUTH EVERY DAY [provider]  Active   Gabapentin, Once-Daily, 300 MG TABS [id number]  Take 300 mg by mouth at bedtime. [name] T, NP  Active   hydrochlorothiazide (MICROZIDE) 12.5 MG capsule [id number]  TAKE 1 CAPSULE BY MOUTH EVERY DAY Cannady, Jolene T, NP  Active   hydrOXYzine (ATARAX) 25 MG tablet [id number]  Take 1 tablet by mouth at bedtime. [provider]  Active   Insulin Glargine (BASAGLAR KWIKPEN) 100 UNIT/ML [id number]  Inject 10 Units into the skin at bedtime. Cannady, Henrine Screws T, NP  Active   Insulin Pen Needle (BD PEN NEEDLE NANO U/F) 32G X 4 MM MISC [id number]  Use 1 each 4 (four) times daily [provider]  Active   ipratropium-albuterol (DUONEB) 0.5-2.5 (3) MG/3ML nebulizer solution 3 mL [id number]   [name] T, NP  Active   Iron-Vitamin C 65-125 MG TABS [id number]  Take 1 tablet by mouth daily. [name] T, NP  Active   LORazepam (ATIVAN) 0.5 MG tablet [id number]  TAKE 1 TAB BY MOUTH TWICE A DAY (USE THE ADDITIONAL 30 TABS ONLY SPARINGLY & ONLY FOR SEVERE ANXIETY) Cannady, Jolene T, NP  Active   losartan (COZAAR) 100 MG tablet  [id number]  Take 1 tablet (100 mg total) by mouth daily. [name] T, NP  Active   Magnesium 400 MG TABS [id number]  Take 1 tablet by mouth daily. [provider]  Active   metFORMIN (GLUCOPHAGE) 1000 MG tablet [id number]  Take 0.5 tablets (500 mg total) by mouth 2 (two) times daily. [name] T, NP  Active   Multiple Vitamin (MULTIVITAMIN) tablet [id number]  Take 1 tablet by mouth daily. [provider]  Active   neomycin-polymyxin b-dexamethasone (MAXITROL) 3.5-10000-0.1 SUSP [id number]  Place 1 drop into the left eye 3 (three) times daily. [provider]  Active   NOVOLOG 100 UNIT/ML injection [id number]  Inject 10 Units into the skin 3 (three) times daily. [provider]  Active            Med Note Kenton Kingfisher, JULIE S   Thu Mar 03, 2021  9:29 AM) 20 units + sliding scale Take 2 units of NovoLog if your sugar is 150-200 Take 4 units of NovoLog if your sugar is 201-250 Take 6 units of NovoLog if your sugar is 251-300 Take 8 units of NovoLog if your sugar is 301-350 Take 10 units of NovoLog if your sugar is 351-400 Take 12 units of NovoLog if your sugar is over 401     nystatin cream (MYCOSTATIN) [id number]  Apply 1 application topically 2 (two) times daily. Johnson, Megan P, DO  Active   Omega-3 Fatty Acids (FISH  OIL) 1200 MG CAPS [id number]  Take 1,200 mg by mouth daily. [provider]  Active Self  pantoprazole (PROTONIX) 40 MG tablet [id number]  TAKE 1 TABLET BY MOUTH EVERY DAY Cannady, Jolene T, NP  Active   sertraline (ZOLOFT) 100 MG tablet [id number]  TAKE 1.5 TABLETS BY MOUTH DAILY Cannady, Jolene T, NP  Active   sitaGLIPtin (JANUVIA) 100 MG tablet [id number]  Take 1 tablet (100 mg total) by mouth daily. [name] T, NP  Active   triamcinolone cream (KENALOG) 0.1 % [id number]  Apply 1 application. topically 2 (two) times daily. [name] T, NP  Active   valACYclovir (VALTREX) 1000 MG tablet [id number]  Take 1,000 mg by mouth daily.  [provider]  Active   vitamin B-12 (CYANOCOBALAMIN) 1000 MCG tablet [id number]  Take 1,000 mcg by mouth daily. [provider]  Active             Patient Active Problem List   Diagnosis Date Noted   Canker sores oral 11/25/2021   History of 2019 novel coronavirus disease (COVID-19) 07/29/2021   B12 deficiency 01/11/2021   Long-term current use of benzodiazepine 03/17/2020   Gastroesophageal reflux disease without esophagitis 12/17/2019   Type 2 diabetes mellitus with hyperglycemia, with long-term current use of insulin ([place]) 11/09/2019   Polyp of transverse colon    Coronary artery disease of bypass graft of native heart with stable angina pectoris ([place]) 07/17/2019   Depression, recurrent ([place]) 10/15/2018   Polymyalgia rheumatica ([place]) 05/15/2018   Restless leg syndrome 10/16/2017   Advanced care planning/counseling discussion 08/23/2017   Insomnia 05/24/2017   CKD stage 3 due to type 2 diabetes mellitus ([place]) 04/13/2016   Fe deficiency anemia 12/01/2015   Anxiety disorder 05/26/2015   Subclavian artery stenosis, left ([place]) 01/01/2015   Hypertension associated with diabetes ([place]) 12/31/2013   Hyperlipidemia associated with type 2 diabetes mellitus ([place]) 12/31/2013    Immunization History  Administered Date(s) Administered   Fluad Quad(high Dose 65+) 09/14/2021   Influenza, High Dose Seasonal PF 09/04/2016, 08/23/2017, 08/28/2018, 07/14/2019, 09/03/2020   Influenza,inj,Quad PF,6+ Mos 09/27/2015   Influenza-Unspecified 09/18/2014   PFIZER(Purple Top)SARS-COV-2 Vaccination 12/04/2019, 12/25/2019, 09/01/2020   Pneumococcal Conjugate-13 04/22/2014   Pneumococcal Polysaccharide-23 11/12/2008, 07/05/2016   Td 12/21/2009, 03/17/2020   Zoster Recombinat (Shingrix) 07/14/2019, 10/15/2019    Conditions to be addressed/monitored: HLD HTN DMII  There are no care plans that you recently modified to display for this patient.  Current Barriers:  Unable to  maintain control of DM and HLD  Pharmacist Clinical Goal(s):  Patient will verbalize ability to afford treatment regimen through collaboration with PharmD and provider.   Interventions: 1:1 collaboration with Venita Lick, NP regarding development and update of comprehensive plan of care as evidenced by provider attestation and co-signature Inter-disciplinary care team collaboration (see longitudinal plan of care) Comprehensive medication review performed; medication list updated in electronic medical record  Hyperlipidemia: (LDL goal < 70) -Uncontrolled -GFRs 50s -Not at goal - 50% LDL reduction and LDL <70 not achieved  -Current treatment: Atorvastatin 40 mg once daily  -Medications previously tried: crestor-myalgias,  -Current dietary patterns: see DM -Current exercise habits: see DM -Educated on Cholesterol goals;  -Counseled on diet and exercise extensively PCP visit 05/31/2022. Could consider ezetimibe 10 mg addition to achieve goal    Diabetes (A1c goal <7%) -Uncontrolled Basal: 0.13 units/kg @ 10 units daily, 73 kg.  Bolus: 0.83 units/kg/day @ avg 20 units/meal, 73 kg. -Current medications: Januvia 100 mg daily (  02/21/22 start) Lantus 10 units QHS  Novolog sliding scale (16-24) Metformin 500 mg BID  -Medications previously tried: Trulicity (GI effects - does not recall specifically); hot flashes on actos, farxiga-now off d/t feeling tired -Counseled on metformin administration - not always taking with meals -Current home glucose readings: 180s on avg per cgm. Equates to a1c ~7.9-8.28 Mar 2022: 7day 173 avg glucose. Above 150 44%, in target 55%, below 70 1%  -Denies hypoglycemic/hyperglycemic symptoms -Current meal patterns: understanding of DM diet is good, trying best to get in a better balance here. May 2023: likes sweets a lot - this is difficult to cut back on. Had a bar for breakfast, no lunch. Lunch probably a sandwich. Dinner stew w/ potatoes -Current exercise:  no exercise; used to use the treadmill May 2023: had been walking and doing yard work leading up to fall; plans to get back exercising  -Educated on A1c and blood sugar goals; Exercise goal of 150 minutes per week; Continuous glucose monitoring; -Diet/exercise recommendations reviewed in detail -Counseled to check feet daily and get yearly eye exams Patient to cut down on bread and starchy vegetables like potatoes  -Assessed patient finances. No current problems.  Patient Goals/Self-Care Activities Patient will:  - take medications as prescribed as evidenced by patient report and record review   Future Appointments  Date Time Provider [place]  04/10/2022  1:00 PM CFP CCM PHARMACY [facility]  05/31/2022  9:00 AM [name] T, NP [facility]  08/31/2022  2:45 PM Ralene Bathe, MD [facility] None  09/04/2022  8:30 AM CFP NURSE HEALTH ADVISOR [facility] [place], PharmD, [facility] Clinical Pharmacist  [facility]  [phone]

## 2022-04-11 DIAGNOSIS — M0609 Rheumatoid arthritis without rheumatoid factor, multiple sites: Secondary | ICD-10-CM | POA: Diagnosis not present

## 2022-04-11 NOTE — Telephone Encounter (Signed)
Transition Care Management Unsuccessful Follow-up Telephone Call ? ?Date of discharge and from where:  [facility] 04-03-2022    ? ?Attempts:  2nd Attempt ? ?Reason for unsuccessful TCM follow-up call:  Left voice message ? ?  ?

## 2022-04-20 ENCOUNTER — Telehealth: Payer: Self-pay

## 2022-04-20 ENCOUNTER — Encounter: Payer: Self-pay | Admitting: Nurse Practitioner

## 2022-04-20 NOTE — Telephone Encounter (Signed)
Received a faxed from patient's pharmacy requesting a new prescription on Novolog. Please advise?

## 2022-04-21 MED ORDER — NOVOLOG FLEXPEN 100 UNIT/ML ~~LOC~~ SOPN: PEN_INJECTOR | SUBCUTANEOUS | 11 refills | Status: DC

## 2022-04-21 NOTE — Addendum Note (Signed)
Addended by: [name] T on: 04/21/2022 03:31 PM   Modules accepted: Orders

## 2022-05-09 DIAGNOSIS — M0609 Rheumatoid arthritis without rheumatoid factor, multiple sites: Secondary | ICD-10-CM | POA: Diagnosis not present

## 2022-05-12 ENCOUNTER — Other Ambulatory Visit: Payer: Self-pay | Admitting: Nurse Practitioner

## 2022-05-15 NOTE — Telephone Encounter (Signed)
Requested medications are due for refill today.  no  Requested medications are on the active medications list.  yes  Last refill. 5/11/20203 #180 0 refills  Future visit scheduled.   yes  Notes to clinic.  Medication refill is not delegated.    Requested Prescriptions  Pending Prescriptions Disp Refills   LORazepam (ATIVAN) 0.5 MG tablet [Pharmacy Med Name: LORAZEPAM 0.5 MG TABLET] 180 tablet 0    Sig: TAKE 1 TAB BY MOUTH TWICE A DAY (USE THE ADDITIONAL 30 TABS ONLY SPARINGLY FOR SEVERE ANXIETY ONLY)     Not Delegated - Psychiatry: Anxiolytics/Hypnotics 2 Failed - 05/12/2022  9:23 PM      Failed - This refill cannot be delegated      Failed - Urine Drug Screen completed in last 360 days      Passed - Patient is not pregnant      Passed - Valid encounter within last 6 months    Recent Outpatient Visits           1 month ago Type 2 diabetes mellitus with hyperglycemia, with long-term current use of insulin ([place])   [place], Jolene T, NP   2 months ago Type 2 diabetes mellitus with hyperglycemia, with long-term current use of insulin ([place])   [place], Jolene T, NP   3 months ago Type 2 diabetes mellitus with hyperglycemia, with long-term current use of insulin ([place])   [place], Jolene T, NP   4 months ago Type 2 diabetes mellitus with hyperglycemia, with long-term current use of insulin ([place])   [place], Jolene T, NP   5 months ago Type 2 diabetes mellitus with hyperglycemia, with long-term current use of insulin ([place])   [place], Barbaraann Faster, NP       Future Appointments             In 2 weeks Venita Lick, NP [facility], [facility]   In 3 months Ralene Bathe, MD [place]   In 3 months  [facility], [facility]

## 2022-05-28 NOTE — Patient Instructions (Signed)

## 2022-05-31 ENCOUNTER — Encounter: Payer: Self-pay | Admitting: Nurse Practitioner

## 2022-05-31 ENCOUNTER — Ambulatory Visit (INDEPENDENT_AMBULATORY_CARE_PROVIDER_SITE_OTHER): Payer: Medicare Other | Admitting: Nurse Practitioner

## 2022-05-31 VITALS — BP 120/68 | HR 65 | Temp 98.6°F | Ht 65.0 in | Wt 160.8 lb

## 2022-05-31 DIAGNOSIS — G2581 Restless legs syndrome: Secondary | ICD-10-CM | POA: Diagnosis not present

## 2022-05-31 DIAGNOSIS — F339 Major depressive disorder, recurrent, unspecified: Secondary | ICD-10-CM | POA: Diagnosis not present

## 2022-05-31 DIAGNOSIS — I771 Stricture of artery: Secondary | ICD-10-CM

## 2022-05-31 DIAGNOSIS — E1122 Type 2 diabetes mellitus with diabetic chronic kidney disease: Secondary | ICD-10-CM | POA: Diagnosis not present

## 2022-05-31 DIAGNOSIS — E1159 Type 2 diabetes mellitus with other circulatory complications: Secondary | ICD-10-CM | POA: Diagnosis not present

## 2022-05-31 DIAGNOSIS — F5101 Primary insomnia: Secondary | ICD-10-CM | POA: Diagnosis not present

## 2022-05-31 DIAGNOSIS — I152 Hypertension secondary to endocrine disorders: Secondary | ICD-10-CM | POA: Diagnosis not present

## 2022-05-31 DIAGNOSIS — E785 Hyperlipidemia, unspecified: Secondary | ICD-10-CM

## 2022-05-31 DIAGNOSIS — E1165 Type 2 diabetes mellitus with hyperglycemia: Secondary | ICD-10-CM

## 2022-05-31 DIAGNOSIS — I25708 Atherosclerosis of coronary artery bypass graft(s), unspecified, with other forms of angina pectoris: Secondary | ICD-10-CM

## 2022-05-31 DIAGNOSIS — N183 Chronic kidney disease, stage 3 unspecified: Secondary | ICD-10-CM

## 2022-05-31 DIAGNOSIS — Z79899 Other long term (current) drug therapy: Secondary | ICD-10-CM

## 2022-05-31 DIAGNOSIS — E1169 Type 2 diabetes mellitus with other specified complication: Secondary | ICD-10-CM

## 2022-05-31 DIAGNOSIS — M353 Polymyalgia rheumatica: Secondary | ICD-10-CM

## 2022-05-31 DIAGNOSIS — E538 Deficiency of other specified B group vitamins: Secondary | ICD-10-CM | POA: Diagnosis not present

## 2022-05-31 DIAGNOSIS — Z794 Long term (current) use of insulin: Secondary | ICD-10-CM | POA: Diagnosis not present

## 2022-05-31 DIAGNOSIS — F413 Other mixed anxiety disorders: Secondary | ICD-10-CM

## 2022-05-31 DIAGNOSIS — D508 Other iron deficiency anemias: Secondary | ICD-10-CM

## 2022-05-31 LAB — BAYER DCA HB A1C WAIVED: HB A1C (BAYER DCA - WAIVED): 7.6 % — ABNORMAL HIGH (ref 4.8–5.6)

## 2022-05-31 MED ORDER — LORAZEPAM 0.5 MG PO TABS: ORAL_TABLET | ORAL | 0 refills | Status: DC

## 2022-05-31 MED ORDER — PANTOPRAZOLE SODIUM 40 MG PO TBEC: 40.0000 mg | DELAYED_RELEASE_TABLET | Freq: Every day | ORAL | 4 refills | Status: DC

## 2022-05-31 NOTE — Assessment & Plan Note (Signed)
Chronic, stable with BP at goal in office today.  Continue current medication regimen and collaboration with cardiology. Labs up to date.  Recommend she monitor BP at least three days a week at home and document for providers + bring to visits.  DASH diet focus.  Return in 3 months.

## 2022-05-31 NOTE — Assessment & Plan Note (Addendum)
Chronic, ongoing.  Denies SI/HI.  Continue Sertraline 150 MG daily as is ordered. This is offering benefit to mood overall and may help reduce benzo use.  Consider increase to 200 MG in future for increased benefit.  Return in 3 months. 

## 2022-05-31 NOTE — Assessment & Plan Note (Signed)
Chronic, ongoing.  Continue collaboration with rheumatology and current medication regimen.  Recent notes reviewed. 

## 2022-05-31 NOTE — Assessment & Plan Note (Signed)
Chronic, ongoing for insomnia.  Has had trials of other medications and reductions with poor outcomes.  Fully aware of risks of chronic benzo use and aware this will be discussed each visit.  UDS updated today and contract on file.

## 2022-05-31 NOTE — Assessment & Plan Note (Addendum)
Ongoing, continue supplement and recheck level every 6 months - last May 2023.

## 2022-05-31 NOTE — Assessment & Plan Note (Addendum)
Stable, continue collaboration with cardiology and preventative treatment to include ASA, Plavix, and statin. 

## 2022-05-31 NOTE — Assessment & Plan Note (Signed)
Chronic, ongoing with use of Lorazepam nightly.  Has had trial reductions in past and other medications without success.  Is fully aware of risks of chronic benzo use and aware this will be discussed each visit.  Return in 3 months.  UDS update today (05/31/22), contract up to date.

## 2022-05-31 NOTE — Assessment & Plan Note (Signed)
Continue collaboration with cardiology and preventative treatment, including statin and ASA. 

## 2022-05-31 NOTE — Assessment & Plan Note (Signed)
Chronic, ongoing.  Continue current medication regimen and adjust as needed.  Lipid panel up to date.  Discussed with patient, if myalgias with Atorvastatin, + her history of myalgia with statins, could consider Repatha initiation if poor tolerance.

## 2022-05-31 NOTE — Assessment & Plan Note (Signed)
Chronic, stable with Gabapentin, continue this regimen and adjust as needed.

## 2022-05-31 NOTE — Assessment & Plan Note (Signed)
Chronic, ongoing.  Denies SI/HI.  Continue Sertraline and Ativan.  Has had multiple attempts at reduction of benzo with no success.  Discussed at length risks with her and she is aware.  Continue Ativan and fill upon request.  PDMP reviewed.  Return in 3 months, UDS updated today (05/31/22) and contract on file.

## 2022-05-31 NOTE — Progress Notes (Signed)
BP 120/68   Pulse 65   Temp 98.6 F (37 C) (Oral)   Ht '5\' 5"'$  (1.651 m)   Wt 160 lb 12.8 oz (72.9 kg)   SpO2 98%   BMI 26.76 kg/m    Subjective:    Patient ID: Laura Rojas, female    DOB: 17-Apr-1946, 76 y.o.   MRN: [id number]  HPI: Laura Rojas is a 76 y.o. female  Chief Complaint  Patient presents with   Diabetes   Hyperlipidemia   Hypertension   Mood   Depression   Chronic Kidney Disease   DIABETES Her last A1c was 7.9% in March. She continues on Basaglar, Novolog sliding scale, Januvia, and Metformin.  Followed by endocrinology in past, last saw NP [facility] 05/16/21, however has not returned as NP no longer at the practice and she prefers to stay with PCP at this time.  Does endorse poor diet past months due to holidays and birthdays.   She did not tolerate GLP1 (Trulicity) in past and had side effects with [place]. Hypoglycemic episodes: a few in low 50's Polydipsia/polyuria: no Visual disturbance: no Chest pain: no Paresthesias: no Glucose Monitoring: yes             Accucheck frequency: Freestyle in place -- range from 50 to 300 range             Fasting glucose:              Post prandial:             Evening:              Before meals: Taking Insulin?: yes             Long acting insulin: 10 units              Short acting insulin: sliding scale units with meals (20 units) Blood Pressure Monitoring: rarely Retinal Examination: Up to Date Foot Exam: Up to Date Pneumovax: Up to Date Influenza: Up to Date Aspirin: yes   HYPERTENSION / HYPERLIPIDEMIA/CAD Followed by cardiology, Dr. Sallyanne Kuster, last saw 10/19/21, sees yearly. She is followed due to subclavian artery stenosis and angina. Continues on Amlodipine, Plavix, HCTZ, Fish oil, Losartan, Lipitor.  Satisfied with current treatment? yes Duration of hypertension: chronic BP monitoring frequency: not checking BP range:not checking BP medication side effects: no Duration of hyperlipidemia:  chronic Cholesterol medication side effects: no Cholesterol supplements: fish oil Medication compliance: good compliance Aspirin: yes Recent stressors: no Recurrent headaches: no Visual changes: no Palpitations: no Dyspnea: no Chest pain: no Lower extremity edema: no Dizzy/lightheaded: no   ANEMIA Continues on supplements daily for iron and B12 -- negative FOBT and H/H improved recent labs. She reports feeling better at this time. Anemia status: stable Etiology of anemia: CKD Duration of anemia treatment: ongoing/stable Compliance with treatment: good compliance Iron supplementation side effects: no Severity of anemia: moderate Fatigue: none Decreased exercise tolerance: no  Dyspnea on exertion: no Palpitations: no Bleeding: no Pica: no    CHRONIC KIDNEY DISEASE Remaining stable on recent labs. CKD status: stable Medications renally dose: yes Previous renal evaluation: no Pneumovax:  Up to Date Influenza Vaccine:  Up to Date    POLYMYALGIA RHEUMATICA & RLS Follows with rheumatology, diagnosis of polymyalgia rheumatica syndrome. Saw last 01/03/22, getting injections at this time -- these have been helping her pain. Continues on Gabapentin for RLS at bedtime only. Duration: chronic Involved joints: right hand, both feet, and left knee Mechanism  of injury: arthritis Severity: 5/10 at worst -- to left foot and occasionally to hands Quality:  dull, aching and burning Frequency: intermittent  Radiation: no Aggravating factors: walking and movement  Alleviating factors: Aspercreme and APAP  Status: stable Treatments attempted: Tylenol and Aspercreme Relief with NSAIDs?:  No NSAIDs Taken Weakness with weight bearing or walking: no Sensation of giving way: no Locking: no Popping: no Bruising: no Swelling: no Redness: no Paresthesias/decreased sensation: no Fevers: no   ANXIETY/STRESS Takes Zoloft 150 MG daily.  Continues Ativan 0.5 MG, takes this at night only,  although ordered BID -- currently reports she takes 1 MG at night. Occasionally will take extra dose during day time, but not often.  Pt aware of risks of benzo medication use to include increased sedation, respiratory suppression, falls, dependence and cardiovascular events.  Pt would like to continue treatment as benefit determined to outweigh risk.  On review PDMP last Ativan fill 02/21/22 for 3 months supply, previous PCP ordered this way. Duration:stable Anxious mood: no Excessive worrying: no Irritability: no  Sweating: no Nausea: no Palpitations:no Hyperventilation: no Panic attacks: no Agoraphobia: no  Obscessions/compulsions: no Depressed mood: occasional    05/31/2022    9:08 AM 03/28/2022    1:07 PM 02/21/2022    2:07 PM 11/25/2021    8:30 AM 09/02/2021    8:19 AM  Depression screen PHQ 2/9  Decreased Interest 0 0 0 0 0  Down, Depressed, Hopeless 0 0 1 0 0  PHQ - 2 Score 0 0 1 0 0  Altered sleeping 2 1 0 0   Tired, decreased energy '1 1 1 1   '$ Change in appetite '2 1 2 1   '$ Feeling bad or failure about yourself  0 0 0 0   Trouble concentrating 0 0 0 0   Moving slowly or fidgety/restless 0 0 0 0   Suicidal thoughts 0 0 0 0   PHQ-9 Score '5 3 4 2   '$ Difficult doing work/chores Not difficult at all   Not difficult at all       05/31/2022    9:08 AM 03/28/2022    1:08 PM 02/21/2022    2:08 PM 11/25/2021    8:31 AM  GAD 7 : Generalized Anxiety Score  Nervous, Anxious, on Edge 0 0 0 0  Control/stop worrying 0 0 0 0  Worry too much - different things 0 0 0 0  Trouble relaxing 0 0 0 0  Restless 0 0 0 0  Easily annoyed or irritable 0 0 1 1  Afraid - awful might happen 0 0 0 0  Total GAD 7 Score 0 0 1 1  Anxiety Difficulty Not difficult at all  Not difficult at all Not difficult at all      Relevant past medical, surgical, family and social history reviewed and updated as indicated. Interim medical history since our last visit reviewed. Allergies and medications reviewed and  updated.  Review of Systems  Per HPI unless specifically indicated above     Objective:    BP 120/68   Pulse 65   Temp 98.6 F (37 C) (Oral)   Ht '5\' 5"'$  (1.651 m)   Wt 160 lb 12.8 oz (72.9 kg)   SpO2 98%   BMI 26.76 kg/m   Wt Readings from Last 3 Encounters:  05/31/22 160 lb 12.8 oz (72.9 kg)  04/03/22 160 lb 15 oz (73 kg)  03/28/22 161 lb 12.8 oz (73.4 kg)    Physical Exam  Vitals and nursing note reviewed.  Constitutional:      General: She is awake. She is not in acute distress.    Appearance: She is well-developed, well-groomed and overweight. She is not ill-appearing.  HENT:     Head: Normocephalic.     Right Ear: Hearing normal.     Left Ear: Hearing normal.  Eyes:     General: Lids are normal.        Right eye: No discharge.        Left eye: No discharge.     Conjunctiva/sclera: Conjunctivae normal.     Pupils: Pupils are equal, round, and reactive to light.  Neck:     Thyroid: No thyromegaly.     Vascular: Carotid bruit (bilateral) present.  Cardiovascular:     Rate and Rhythm: Normal rate and regular rhythm.     Heart sounds: Normal heart sounds. No murmur heard.    No gallop.  Pulmonary:     Effort: Pulmonary effort is normal. No accessory muscle usage or respiratory distress.     Breath sounds: No wheezing or rhonchi.  Abdominal:     General: Bowel sounds are normal.     Palpations: Abdomen is soft.  Musculoskeletal:     Cervical back: Normal range of motion and neck supple.     Right lower leg: No edema.     Left lower leg: No edema.  Skin:    General: Skin is warm and dry.  Neurological:     Mental Status: She is alert and oriented to person, place, and time.  Psychiatric:        Attention and Perception: Attention normal.        Mood and Affect: Mood normal.        Speech: Speech normal.        Behavior: Behavior normal. Behavior is cooperative.        Thought Content: Thought content normal.    Results for orders placed or performed in  visit on 05/31/22  Bayer DCA Hb A1c Waived  Result Value Ref Range   HB A1C (BAYER DCA - WAIVED) 7.6 (H) 4.8 - 5.6 %      Assessment & Plan:   Problem List Items Addressed This Visit       Cardiovascular and Mediastinum   Coronary artery disease of bypass graft of native heart with stable angina pectoris ([place])    Continue collaboration with cardiology and preventative treatment, including statin and ASA.      Hypertension associated with diabetes ([place])    Chronic, stable with BP at goal in office today.  Continue current medication regimen and collaboration with cardiology. Labs up to date.  Recommend she monitor BP at least three days a week at home and document for providers + bring to visits.  DASH diet focus.  Return in 3 months.      Relevant Orders   Bayer DCA Hb A1c Waived (Completed)   Subclavian artery stenosis, left (HCC)    Stable, continue collaboration with cardiology and preventative treatment to include ASA, Plavix, and statin.        Endocrine   CKD stage 3 due to type 2 diabetes mellitus (HCC)    Chronic, stable.  Continue Losartan for kidney protection.  Urine ALB March 2023 =  ALB 10 and A:C <30. Continue collaboration with cardiology and endocrinology.  Consider nephrology referral if worsening function.  Labs up to date.      Relevant Orders   Bayer  DCA Hb A1c Waived (Completed)   Hyperlipidemia associated with type 2 diabetes mellitus (HCC)    Chronic, ongoing.  Continue current medication regimen and adjust as needed.  Lipid panel up to date.  Discussed with patient, if myalgias with Atorvastatin, + her history of myalgia with statins, could consider Repatha initiation if poor tolerance.      Relevant Orders   Bayer DCA Hb A1c Waived (Completed)   Type 2 diabetes mellitus with hyperglycemia, with long-term current use of insulin (HCC) - Primary    Chronic, ongoing, followed by endocrinology in past with A1c 7.6% today, trending down.  She wishes to  avoid return to endo at this time as her previous provider left.  Will continue Januvia 100 MG daily which is offering benefit (renal dose as needed) + recommend she continue Basaglar but increase slightly to 12 units + cut back on her sliding scale with this, continue Metformin.  Continue collaboration with CCM team in office.   Bring Libre to next visit.  Return to office in 3 months -- goal to avoid hypoglycemia.        Relevant Orders   Bayer DCA Hb A1c Waived (Completed)     Other   Anxiety disorder    Chronic, ongoing.  Denies SI/HI.  Continue Sertraline and Ativan.  Has had multiple attempts at reduction of benzo with no success.  Discussed at length risks with her and she is aware.  Continue Ativan and fill upon request.  PDMP reviewed.  Return in 3 months, UDS updated today (05/31/22) and contract on file.      Relevant Medications   LORazepam (ATIVAN) 0.5 MG tablet   B12 deficiency    Ongoing, continue supplement and recheck level every 6 months - last May 2023.      Depression, recurrent (HCC)    Chronic, ongoing.  Denies SI/HI.  Continue Sertraline 150 MG daily as is ordered. This is offering benefit to mood overall and may help reduce benzo use.  Consider increase to 200 MG in future for increased benefit.  Return in 3 months.      Relevant Medications   LORazepam (ATIVAN) 0.5 MG tablet   Fe deficiency anemia    Ongoing, continue daily supplement as is offering benefit at this time.  Fatigue improved and recent H/H stable.  Consider GI or hematology referral if ongoing or worsening.      Insomnia    Chronic, ongoing with use of Lorazepam nightly.  Has had trial reductions in past and other medications without success.  Is fully aware of risks of chronic benzo use and aware this will be discussed each visit.  Return in 3 months.  UDS update today (05/31/22), contract up to date.      Long-term current use of benzodiazepine    Chronic, ongoing for insomnia.  Has had trials of  other medications and reductions with poor outcomes.  Fully aware of risks of chronic benzo use and aware this will be discussed each visit.  UDS updated today and contract on file.      Relevant Orders   [id number] 11+Oxyco+Alc+Crt-Bund   Polymyalgia rheumatica (HCC)    Chronic, ongoing.  Continue collaboration with rheumatology and current medication regimen.  Recent notes reviewed.      Restless leg syndrome    Chronic, stable with Gabapentin, continue this regimen and adjust as needed.      Other Visit Diagnoses     High risk medication use  UDS today for Benzo use.   Relevant Orders   [id number] 11+Oxyco+Alc+Crt-Bund        Follow up plan: Return in about 3 months (around 08/31/2022) for T2DM, HTN/HLD, CKD, MOOD, PAIN.

## 2022-05-31 NOTE — Assessment & Plan Note (Signed)
Chronic, ongoing, followed by endocrinology in past with A1c 7.6% today, trending down.  She wishes to avoid return to endo at this time as her previous provider left.  Will continue Januvia 100 MG daily which is offering benefit (renal dose as needed) + recommend she continue Basaglar but increase slightly to 12 units + cut back on her sliding scale with this, continue Metformin.  Continue collaboration with CCM team in office.   Bring Libre to next visit.  Return to office in 3 months -- goal to avoid hypoglycemia.

## 2022-05-31 NOTE — Assessment & Plan Note (Signed)
Chronic, stable.  Continue Losartan for kidney protection.  Urine ALB March 2023 =  ALB 10 and A:C <30. Continue collaboration with cardiology and endocrinology.  Consider nephrology referral if worsening function.  Labs up to date.

## 2022-05-31 NOTE — Assessment & Plan Note (Signed)
Ongoing, continue daily supplement as is offering benefit at this time.  Fatigue improved and recent H/H stable.  Consider GI or hematology referral if ongoing or worsening.

## 2022-06-02 LAB — DRUG SCREEN 764883 11+OXYCO+ALC+CRT-BUND

## 2022-06-04 ENCOUNTER — Other Ambulatory Visit: Payer: Self-pay | Admitting: Family Medicine

## 2022-06-05 ENCOUNTER — Telehealth: Payer: Self-pay

## 2022-06-05 NOTE — Chronic Care Management (AMB) (Signed)
Chronic Care Management Pharmacy Assistant   Name: Laura Rojas  MRN: [id number] DOB: December 02, 1945   Reason for Encounter: Disease State-General    Recent office visits:  05/31/22 [name] T, NP (Diabetes Mellitus) Orders: Bayer DCA Hb A1c. Medication changes: none  Recent consult visits:  None since last coordination call  Hospital visits:  Medication Reconciliation was completed by comparing discharge summary, patient's EMR and Pharmacy list, and upon discussion with patient.  Admitted to the hospital on 04/03/22 due to Fall. Discharge date was 04/03/22. Discharged from [facility] ED.    Medications that remain the same after Hospital Discharge:??  -All other medications will remain the same.    Medications: Outpatient Encounter Medications as of 06/05/2022  Medication Sig   albuterol (VENTOLIN HFA) 108 (90 Base) MCG/ACT inhaler TAKE 2 PUFFS BY MOUTH EVERY 6 HOURS AS NEEDED FOR WHEEZE OR SHORTNESS OF BREATH   amLODipine (NORVASC) 2.5 MG tablet Take 1 tablet (2.5 mg total) by mouth daily.   aspirin 81 MG tablet Take 81 mg by mouth daily.   atorvastatin (LIPITOR) 40 MG tablet TAKE 1 TABLET BY MOUTH EVERY DAY** STOP THE SIMVASTATIN   BD PEN NEEDLE NANO U/F 32G X 4 MM MISC SMARTSIG:1 Each SUB-Q 4 Times Daily   certolizumab pegol (CIMZIA) 2 X 200 MG/ML PSKT Inject into the skin.   cholecalciferol (VITAMIN D3) 25 MCG (1000 UT) tablet Take 1,000 Units by mouth daily.   clobetasol cream (TEMOVATE) 0.05 % Spot treat affected areas twice a day until improved. Avoid face, groin, axilla.   clopidogrel (PLAVIX) 75 MG tablet Take 1 tablet (75 mg total) by mouth daily.   Continuous Blood Gluc Sensor (FREESTYLE LIBRE 14 DAY SENSOR) MISC Use 1 kit every 14 (fourteen) days P53.74   folic acid (FOLVITE) 1 MG tablet folic acid 1 mg tablet  TAKE 1 TABLET BY MOUTH EVERY DAY   Gabapentin, Once-Daily, 300 MG TABS Take 300 mg by mouth at bedtime.   hydrochlorothiazide  (MICROZIDE) 12.5 MG capsule TAKE 1 CAPSULE BY MOUTH EVERY DAY   hydrOXYzine (ATARAX) 25 MG tablet Take 1 tablet by mouth at bedtime.   insulin aspart (NOVOLOG FLEXPEN) 100 UNIT/ML FlexPen Inject 10 Units into the skin 3 (three) times daily.  Inject per sliding scale regimen.   Insulin Glargine (BASAGLAR KWIKPEN) 100 UNIT/ML Inject 10 Units into the skin at bedtime.   Insulin Pen Needle (BD PEN NEEDLE NANO U/F) 32G X 4 MM MISC Use 1 each 4 (four) times daily   Iron-Vitamin C 65-125 MG TABS Take 1 tablet by mouth daily.   LORazepam (ATIVAN) 0.5 MG tablet TAKE 1 TAB BY MOUTH TWICE A DAY (USE THE ADDITIONAL 30 TABS ONLY SPARINGLY & ONLY FOR SEVERE ANXIETY)   losartan (COZAAR) 100 MG tablet Take 1 tablet (100 mg total) by mouth daily.   Magnesium 400 MG TABS Take 1 tablet by mouth daily.   metFORMIN (GLUCOPHAGE) 1000 MG tablet Take 0.5 tablets (500 mg total) by mouth 2 (two) times daily.   Multiple Vitamin (MULTIVITAMIN) tablet Take 1 tablet by mouth daily.   neomycin-polymyxin b-dexamethasone (MAXITROL) 3.5-10000-0.1 SUSP Place 1 drop into the left eye 3 (three) times daily.   Omega-3 Fatty Acids (FISH OIL) 1200 MG CAPS Take 1,200 mg by mouth daily.   pantoprazole (PROTONIX) 40 MG tablet Take 1 tablet (40 mg total) by mouth daily.   sertraline (ZOLOFT) 100 MG tablet TAKE 1.5 TABLETS BY MOUTH DAILY   sitaGLIPtin (JANUVIA)  100 MG tablet Take 1 tablet (100 mg total) by mouth daily.   triamcinolone cream (KENALOG) 0.1 % Apply 1 application. topically 2 (two) times daily.   valACYclovir (VALTREX) 1000 MG tablet Take 1,000 mg by mouth daily.   vitamin B-12 (CYANOCOBALAMIN) 1000 MCG tablet Take 1,000 mcg by mouth daily.   No facility-administered encounter medications on file as of 06/05/2022.   Contacted [name] for General Review Call   Chart Review:  Have there been any documented new, changed, or discontinued medications since last visit? No (If yes, include name, dose, frequency,  date) Has there been any documented recent hospitalizations or ED visits since last visit with Clinical Pharmacist? Yes Brief Summary (including medication and/or Diagnosis changes):   Adherence Review:  Does the Clinical Pharmacist Assistant have access to adherence rates? Yes Adherence rates for STAR metric medications (List medication(s)/day supply/ last 2 fill dates). Adherence rates for medications indicated for disease state being reviewed (List medication(s)/day supply/ last 2 fill dates). Does the patient have >5 day gap between last estimated fill dates for any of the above medications or other medication gaps? No Reason for medication gaps.   Disease State Questions:  Able to connect with Patient? Yes  Did patient have any problems with their health recently? No Note problems and Concerns:  Have you had any admissions or emergency room visits or worsening of your condition(s) since last visit? Yes Details of ED visit, hospital visit and/or worsening condition(s): 04/03/22 due to Fall. Discharge date was 04/03/22. Discharged from [facility] ED.   Have you had any visits with new specialists or providers since your last visit? No Explain:  Have you had any new health care problem(s) since your last visit? No New problem(s) reported:  Have you run out of any of your medications since you last spoke with clinical pharmacist? No What caused you to run out of your medications?  Are there any medications you are not taking as prescribed? No What kept you from taking your medications as prescribed?  Are you having any issues or side effects with your medications? No Note of issues or side effects:  Do you have any other health concerns or questions you want to discuss with your Clinical Pharmacist before your next visit? No Note additional concerns and questions from Patient. Are there any health concerns that you feel we can do a better job addressing?  No Note Patient's response.  Are you having any problems with any of the following since the last visit: (select all that apply)  None  Details:  12. Any falls since last visit? No  Details:  13. Any increased or uncontrolled pain since last visit? No  Details:  14. Next visit Type: telephone       Visit with:Clinical Pharmacist        Date:08/14/22        Time:1:45pm  15. Additional Details? No    Care Gaps: Colonoscopy-08/19/19 Diabetic Foot Exam-02/21/22 Mammogram-NA Ophthalmology-11/30/21 Dexa Scan - 05/13/14 Annual Well Visit - NA Micro albumin-02/21/22 Hemoglobin A1c- 05/31/22  Star Rating Drugs: Atorvastatin 40 mg-last fill 03/10/22 90 ds Losartan 100 mg-last fill 03/30/22 90 ds Metformin 1000 mg-last fill 05/20/22 90 ds  Ethelene Hal Clinical Pharmacist Assistant [phone]

## 2022-06-05 NOTE — Telephone Encounter (Signed)
Discontinued 05/28/22 Requested Prescriptions  Pending Prescriptions Disp Refills  . nystatin cream (MYCOSTATIN) [Pharmacy Med Name: NYSTATIN 100,000 UNIT/GM CREAM] 30 g 2    Sig: APPLY TO AFFECTED AREA TWICE A DAY     Off-Protocol Failed - 06/04/2022 10:14 PM      Failed - Medication not assigned to a protocol, review manually.      Passed - Valid encounter within last 12 months    Recent Outpatient Visits          5 days ago Type 2 diabetes mellitus with hyperglycemia, with long-term current use of insulin ([place])   [place], [place] T, NP   2 months ago Type 2 diabetes mellitus with hyperglycemia, with long-term current use of insulin ([place])   [place], Jolene T, NP   3 months ago Type 2 diabetes mellitus with hyperglycemia, with long-term current use of insulin ([place])   [place], Jolene T, NP   4 months ago Type 2 diabetes mellitus with hyperglycemia, with long-term current use of insulin ([place])   [place], Jolene T, NP   5 months ago Type 2 diabetes mellitus with hyperglycemia, with long-term current use of insulin ([place])   [place], Barbaraann Faster, NP      Future Appointments            In 2 months Ralene Bathe, MD [place]   In 2 months Cannady, Barbaraann Faster, NP [facility]   In 3 months  [facility]

## 2022-06-07 DIAGNOSIS — M0609 Rheumatoid arthritis without rheumatoid factor, multiple sites: Secondary | ICD-10-CM | POA: Diagnosis not present

## 2022-06-07 DIAGNOSIS — Z79899 Other long term (current) drug therapy: Secondary | ICD-10-CM | POA: Diagnosis not present

## 2022-06-07 DIAGNOSIS — M159 Polyosteoarthritis, unspecified: Secondary | ICD-10-CM | POA: Diagnosis not present

## 2022-06-09 ENCOUNTER — Other Ambulatory Visit: Payer: Self-pay | Admitting: Nurse Practitioner

## 2022-06-09 DIAGNOSIS — I2510 Atherosclerotic heart disease of native coronary artery without angina pectoris: Secondary | ICD-10-CM

## 2022-06-12 NOTE — Telephone Encounter (Signed)
Requested Prescriptions  Pending Prescriptions Disp Refills  . clopidogrel (PLAVIX) 75 MG tablet [Pharmacy Med Name: CLOPIDOGREL 75 MG TABLET] 90 tablet 1    Sig: TAKE 1 TABLET BY MOUTH EVERY DAY     Hematology: Antiplatelets - clopidogrel Failed - 06/09/2022 11:20 PM      Failed - Cr in normal range and within 360 days    Creatinine  Date Value Ref Range Status  04/29/2021 106.2 20.0 - 300.0 mg/dL Final   Creatinine, Ser  Date Value Ref Range Status  03/28/2022 1.06 (H) 0.57 - 1.00 mg/dL Final         Passed - HCT in normal range and within 180 days    Hematocrit  Date Value Ref Range Status  03/28/2022 38.0 34.0 - 46.6 % Final         Passed - HGB in normal range and within 180 days    Hemoglobin  Date Value Ref Range Status  03/28/2022 12.7 11.1 - 15.9 g/dL Final         Passed - PLT in normal range and within 180 days    Platelets  Date Value Ref Range Status  03/28/2022 236 150 - 450 x10E3/uL Final         Passed - Valid encounter within last 6 months    Recent Outpatient Visits          1 week ago Type 2 diabetes mellitus with hyperglycemia, with long-term current use of insulin ([place])   [place], Jolene T, NP   2 months ago Type 2 diabetes mellitus with hyperglycemia, with long-term current use of insulin ([place])   [place], Jolene T, NP   3 months ago Type 2 diabetes mellitus with hyperglycemia, with long-term current use of insulin ([place])   [place], Jolene T, NP   4 months ago Type 2 diabetes mellitus with hyperglycemia, with long-term current use of insulin ([place])   [place], Jolene T, NP   5 months ago Type 2 diabetes mellitus with hyperglycemia, with long-term current use of insulin ([place])   [place], Barbaraann Faster, NP      Future Appointments            In 2 months Ralene Bathe, MD [place]   In 2 months Cannady, Barbaraann Faster, NP  [facility]   In 2 months  [facility]

## 2022-06-21 ENCOUNTER — Other Ambulatory Visit: Payer: Self-pay | Admitting: Nurse Practitioner

## 2022-06-21 NOTE — Telephone Encounter (Signed)
Medication Refill - Medication: insulin aspart (NOVOLOG FLEXPEN) 100 UNIT/ML FlexPen  Pt is requesting a 90-day refill for insurance to pay for it. Pt stated pharmacy advised her she could not have a refill until the end of August, and she has one pen left.  Pt stated she may have left a few pens when she went to the beach. Pt stated she is going out of town and needs a refill.  Has the patient contacted their pharmacy? Yes.    (Agent: If yes, when and what did the pharmacy advise?)  Preferred Pharmacy (with phone number or street name):  [id number] [address]  [address] Phone: [phone]: [phone] Hours: Not open 24 hours   Has the patient been seen for an appointment in the last year OR does the patient have an upcoming appointment? Yes.    Agent: Please be advised that RX refills may take up to 3 business days. We ask that you follow-up with your pharmacy.

## 2022-06-21 NOTE — Telephone Encounter (Unsigned)
Copied from [place] [id number]. Topic: General - Other >> Jun 21, 2022  2:18 PM Everette C wrote: Reason for [id number] Refill - Medication: insulin aspart (NOVOLOG FLEXPEN) 100 UNIT/ML FlexPen [[id number]]   Has the patient contacted their pharmacy? Yes.  The patient has been directed by their pharmacy to contact their PCP and request a 45 day supply of the medication   (Agent: If no, request that the patient contact the pharmacy for the refill. If patient does not wish to contact the pharmacy document the reason why and proceed with request.) (Agent: If yes, when and what did the pharmacy advise?)  Preferred Pharmacy (with phone number or street name): [id number] [address]: [phone]: [phone]: Not open 24 hours   Has the patient been seen for an appointment in the last year OR does the patient have an upcoming appointment? Yes.    Agent: Please be advised that RX refills may take up to 3 business days. We ask that you follow-up with your pharmacy.

## 2022-06-21 NOTE — Telephone Encounter (Signed)
[facility] and spoke to Wille Glaser, [facility] about the patient losing her Novolog Flexpen while at the beach and that she's wanting a 90 day supply. He says when it's refilled, it's filled for a 90 day supply and what she can do is call her insurance company to let them know she lost her medicine and they will place an override so that [facility] can [facility]. Patient called and advised, she asked which insurance company, advised to [facility] to find out which one is paying for the insulin, she verbalized understanding.

## 2022-06-22 ENCOUNTER — Other Ambulatory Visit: Payer: Self-pay | Admitting: Nurse Practitioner

## 2022-06-22 MED ORDER — NOVOLOG FLEXPEN 100 UNIT/ML ~~LOC~~ SOPN: PEN_INJECTOR | SUBCUTANEOUS | 2 refills | Status: DC

## 2022-06-22 NOTE — Telephone Encounter (Signed)
Requested Prescriptions  Pending Prescriptions Disp Refills  . insulin aspart (NOVOLOG FLEXPEN) 100 UNIT/ML FlexPen 15 mL 11    Sig: Inject 10 Units into the skin 3 (three) times daily.  Inject per sliding scale regimen.     Endocrinology:  Diabetes - Insulins Passed - 06/21/2022  3:48 PM      Passed - HBA1C is between 0 and 7.9 and within 180 days    Hemoglobin A1C  Date Value Ref Range Status  12/31/2019 8.2  Final   HB A1C (BAYER DCA - WAIVED)  Date Value Ref Range Status  05/31/2022 7.6 (H) 4.8 - 5.6 % Final    Comment:             Prediabetes: 5.7 - 6.4          Diabetes: >6.4          Glycemic control for adults with diabetes: <7.0          Passed - Valid encounter within last 6 months    Recent Outpatient Visits          3 weeks ago Type 2 diabetes mellitus with hyperglycemia, with long-term current use of insulin ([place])   [place], Perham T, NP   2 months ago Type 2 diabetes mellitus with hyperglycemia, with long-term current use of insulin ([place])   [place], Jolene T, NP   4 months ago Type 2 diabetes mellitus with hyperglycemia, with long-term current use of insulin ([place])   [place], Jolene T, NP   5 months ago Type 2 diabetes mellitus with hyperglycemia, with long-term current use of insulin ([place])   [place], Woodruff T, NP   6 months ago Type 2 diabetes mellitus with hyperglycemia, with long-term current use of insulin ([place])   [place], Barbaraann Faster, NP      Future Appointments            In 2 weeks Ralene Bathe, MD [place]   In 2 months Ralene Bathe, MD [place]   In 2 months Cannady, Barbaraann Faster, NP [place]   In 2 months  [facility]

## 2022-06-23 ENCOUNTER — Telehealth: Payer: Self-pay | Admitting: Nurse Practitioner

## 2022-06-23 NOTE — Telephone Encounter (Signed)
Requested medication (s) are due for refill today - expired Rx  Requested medication (s) are on the active medication list -yes  Future visit scheduled -yes  Last refill: 08/13/20  Notes to clinic: expired Rx, historical medication   Requested Prescriptions  Pending Prescriptions Disp Refills   Continuous Blood Gluc Sensor (FREESTYLE LIBRE 14 DAY SENSOR) MISC [Pharmacy Med Name: FREESTYLE LIBRE [place]  3    Sig: USE 1 KIT EVERY 14 (FOURTEEN) DAYS     Endocrinology: Diabetes - Testing Supplies Passed - 06/22/2022  2:42 PM      Passed - Valid encounter within last 12 months    Recent Outpatient Visits           3 weeks ago Type 2 diabetes mellitus with hyperglycemia, with long-term current use of insulin ([place])   [place], Jolene T, NP   2 months ago Type 2 diabetes mellitus with hyperglycemia, with long-term current use of insulin ([place])   [place], Jolene T, NP   4 months ago Type 2 diabetes mellitus with hyperglycemia, with long-term current use of insulin ([place])   [place], Jolene T, NP   5 months ago Type 2 diabetes mellitus with hyperglycemia, with long-term current use of insulin ([place])   [place], Beaver T, NP   6 months ago Type 2 diabetes mellitus with hyperglycemia, with long-term current use of insulin ([place])   [place], Henrine Screws T, NP       Future Appointments             In 1 week Ralene Bathe, MD [place]   In 2 months Ralene Bathe, MD [place]   In 2 months Cannady, Barbaraann Faster, NP [place]   In 2 months  [facility], [place]               Requested Prescriptions  Pending Prescriptions Disp Refills   Continuous Blood Gluc Sensor (FREESTYLE LIBRE 14 DAY SENSOR) MISC [Pharmacy Med Name: FREESTYLE LIBRE 14 DAY SENSOR]  3    Sig: USE 1 KIT EVERY 14 (FOURTEEN) DAYS      Endocrinology: Diabetes - Testing Supplies Passed - 06/22/2022  2:42 PM      Passed - Valid encounter within last 12 months    Recent Outpatient Visits           3 weeks ago Type 2 diabetes mellitus with hyperglycemia, with long-term current use of insulin ([place])   [place], [place] T, NP   2 months ago Type 2 diabetes mellitus with hyperglycemia, with long-term current use of insulin ([place])   [place], Jolene T, NP   4 months ago Type 2 diabetes mellitus with hyperglycemia, with long-term current use of insulin ([place])   [place], Jolene T, NP   5 months ago Type 2 diabetes mellitus with hyperglycemia, with long-term current use of insulin ([place])   [place], [place] T, NP   6 months ago Type 2 diabetes mellitus with hyperglycemia, with long-term current use of insulin ([place])   [place], Barbaraann Faster, NP       Future Appointments             In 1 week Ralene Bathe, MD [place]   In 2 months Ralene Bathe, MD [place]   In  2 months Cannady, Barbaraann Faster, NP [facility]   In 2 months  [facility]

## 2022-06-23 NOTE — Telephone Encounter (Signed)
Pt stated her insurance requires 90 day Rx

## 2022-07-06 ENCOUNTER — Ambulatory Visit: Payer: Medicare Other | Admitting: Dermatology

## 2022-07-06 DIAGNOSIS — M0609 Rheumatoid arthritis without rheumatoid factor, multiple sites: Secondary | ICD-10-CM | POA: Diagnosis not present

## 2022-07-14 ENCOUNTER — Encounter: Payer: Self-pay | Admitting: Nurse Practitioner

## 2022-07-17 ENCOUNTER — Ambulatory Visit: Payer: Medicare Other | Admitting: Dermatology

## 2022-07-17 ENCOUNTER — Ambulatory Visit: Payer: Self-pay

## 2022-07-17 NOTE — Telephone Encounter (Signed)
Chief Complaint: Abdominal pain Symptoms: To the lower left abdomen, sharp pain at 10 in the mornings, no pain at present Frequency: Onset a few weeks ago only in the morning and during the night Pertinent Negatives: Patient denies other symptoms Disposition: '[]'$ ED /'[]'$ Urgent Care (no appt availability in office) / '[x]'$ Appointment(In office/virtual)/ '[]'$  [facility]/ '[]'$ Home Care/ '[]'$ Refused Recommended Disposition /'[]'$  Mobile Bus/ '[]'$  Follow-up with PCP Additional Notes: Reports the pain during the night and early morning so severe it's hard to get OOB, no pain at present. Scheduled tomorrow.    Reason for Disposition  Age > 60 years  Answer Assessment - Initial Assessment Questions 1. LOCATION: "Where does it hurt?"      Left lower abdomen 2. RADIATION: "Does the pain shoot anywhere else?" (e.g., chest, back)     No 3. ONSET: "When did the pain begin?" (e.g., minutes, hours or days ago)      A few weeks only in the morning 4. SUDDEN: "Gradual or sudden onset?"     Gradual 5. PATTERN "Does the pain come and go, or is it constant?"    - If it comes and goes: "How long does it last?" "Do you have pain now?"     (Note: Comes and goes means the pain is intermittent. It goes away completely between bouts.)    - If constant: "Is it getting better, staying the same, or getting worse?"      (Note: Constant means the pain never goes away completely; most serious pain is constant and gets worse.)      Comes and goes 6. SEVERITY: "How bad is the pain?"  (e.g., Scale 1-10; mild, moderate, or severe)    - MILD (1-3): Doesn't interfere with normal activities, abdomen soft and not tender to touch.     - MODERATE (4-7): Interferes with normal activities or awakens from sleep, abdomen tender to touch.     - SEVERE (8-10): Excruciating pain, doubled over, unable to do any normal activities.       Right now, no pain. 10 in the morning 7. RECURRENT SYMPTOM: "Have you ever had this type  of stomach pain before?" If Yes, ask: "When was the last time?" and "What happened that time?"      No 8. CAUSE: "What do you think is causing the stomach pain?"     No idea 9. RELIEVING/AGGRAVATING FACTORS: "What makes it better or worse?" (e.g., antacids, bending or twisting motion, bowel movement)     Nothing 10. OTHER SYMPTOMS: "Do you have any other symptoms?" (e.g., back pain, diarrhea, fever, urination pain, vomiting)       No 11. PREGNANCY: "Is there any chance you are pregnant?" "When was your last menstrual period?"       N/A  Protocols used: Abdominal Pain - [facility]

## 2022-07-18 ENCOUNTER — Ambulatory Visit (INDEPENDENT_AMBULATORY_CARE_PROVIDER_SITE_OTHER): Payer: Medicare Other | Admitting: Nurse Practitioner

## 2022-07-18 ENCOUNTER — Encounter: Payer: Self-pay | Admitting: Nurse Practitioner

## 2022-07-18 VITALS — BP 110/70 | HR 64 | Temp 98.2°F | Ht 65.0 in | Wt 158.0 lb

## 2022-07-18 DIAGNOSIS — R1012 Left upper quadrant pain: Secondary | ICD-10-CM | POA: Diagnosis not present

## 2022-07-18 DIAGNOSIS — K8500 Idiopathic acute pancreatitis without necrosis or infection: Secondary | ICD-10-CM

## 2022-07-18 DIAGNOSIS — R1084 Generalized abdominal pain: Secondary | ICD-10-CM | POA: Diagnosis not present

## 2022-07-18 LAB — URINALYSIS, ROUTINE W REFLEX MICROSCOPIC
Bilirubin, UA: NEGATIVE
Glucose, UA: NEGATIVE
Ketones, UA: NEGATIVE
Leukocytes,UA: NEGATIVE
Nitrite, UA: NEGATIVE
Protein,UA: NEGATIVE
RBC, UA: NEGATIVE
Specific Gravity, UA: 1.02 (ref 1.005–1.030)
Urobilinogen, Ur: 1 mg/dL (ref 0.2–1.0)
pH, UA: 6.5 (ref 5.0–7.5)

## 2022-07-18 MED ORDER — TIZANIDINE HCL 2 MG PO TABS: 2.0000 mg | ORAL_TABLET | Freq: Three times a day (TID) | ORAL | 0 refills | Status: DC | PRN

## 2022-07-18 NOTE — Patient Instructions (Signed)
Abdominal Pain, Adult Many things can cause belly (abdominal) pain. Most times, belly pain is not dangerous. Many cases of belly pain can be watched and treated at home. Sometimes, though, belly pain is serious. Your doctor will try to find the cause of your belly pain. Follow these instructions at home:  Medicines Take over-the-counter and prescription medicines only as told by your doctor. Do not take medicines that help you poop (laxatives) unless told by your doctor. General instructions Watch your belly pain for any changes. Drink enough fluid to keep your pee (urine) pale yellow. Keep all follow-up visits as told by your doctor. This is important. Contact a doctor if: Your belly pain changes or gets worse. You are not hungry, or you lose weight without trying. You are having trouble pooping (constipated) or have watery poop (diarrhea) for more than 2-3 days. You have pain when you pee or poop. Your belly pain wakes you up at night. Your pain gets worse with meals, after eating, or with certain foods. You are vomiting and cannot keep anything down. You have a fever. You have blood in your pee. Get help right away if: Your pain does not go away as soon as your doctor says it should. You cannot stop vomiting. Your pain is only in areas of your belly, such as the right side or the left lower part of the belly. You have bloody or black poop, or poop that looks like tar. You have very bad pain, cramping, or bloating in your belly. You have signs of not having enough fluid or water in your body (dehydration), such as: Dark pee, very little pee, or no pee. Cracked lips. Dry mouth. Sunken eyes. Sleepiness. Weakness. You have trouble breathing or chest pain. Summary Many cases of belly pain can be watched and treated at home. Watch your belly pain for any changes. Take over-the-counter and prescription medicines only as told by your doctor. Contact a doctor if your belly pain  changes or gets worse. Get help right away if you have very bad pain, cramping, or bloating in your belly. This information is not intended to replace advice given to you by your health care provider. Make sure you discuss any questions you have with your health care provider. Document Revised: 03/24/2019 Document Reviewed: 03/24/2019 Elsevier Patient Education  2023 Elsevier Inc.  

## 2022-07-18 NOTE — Assessment & Plan Note (Signed)
Acute and ongoing for one month, only presents first thing in morning or when rolling over in bed.  Suspect more musculoskeletal.  Recommend they obtain a new mattress, current is quite old per her report -- with her RA may need more support.  Will send in Tizanidine to take only as needed, low dose 2 MG -- if pain in morning may take this.  Continue Tylenol at night.  Check labs today CMP, Amylase, Lipase.  If worsening or ongoing then consider imaging.

## 2022-07-18 NOTE — Progress Notes (Signed)
BP 110/70   Pulse 64   Temp 98.2 F (36.8 C) (Oral)   Ht '5\' 5"'$  (1.651 m)   Wt 158 lb (71.7 kg)   SpO2 96%   BMI 26.29 kg/m    Subjective:    Patient ID: Laura Rojas, female    DOB: 11/03/1946, 76 y.o.   MRN: [id number]  HPI: Laura Rojas is a 76 y.o. female  Chief Complaint  Patient presents with   Abdominal Pain    Patient is here for Abdominal Pain. Patient says when she tries to get out of bed left side of her abdomen hurts really bad. Patient says she notices the right side can hurt some, but not as bad as the left side. Patient says she noticed it every morning.    ABDOMINAL PAIN  Has had ongoing LUQ pain for one month.  Notices most often when she gets up in morning -- hurts so much is hollering and then 15-20 minutes it goes away.  Occasionally notices to RUQ, but never as bad as left side.  Does have older mattress.  Does not notice pain any other time, then when getting out of bed in morning.  Does have RA followed by rheumatology, last visit 06/07/22.  Only takes Tylenol at home when needed.  Taking Cimzia injections for RA, these have been offering benefit. Duration:weeks Onset: sudden Severity: 10/10 for 15 to 20 minutes Quality: sharp, dull, aching, and throbbing Location:  LUQ  Episode duration: 15 to 20 minutes Radiation: no Frequency: intermittent Alleviating factors: nothing, it goes away on own Aggravating factors: getting up in morning Status: stable Treatments attempted: none Fever: no Nausea: no Vomiting: no Weight loss: no Decreased appetite: no Diarrhea: no Constipation: no Blood in stool: no Heartburn: no Jaundice: no Rash: no Dysuria/urinary frequency: no Hematuria: no History of sexually transmitted disease: no Recurrent NSAID use: no   Relevant past medical, surgical, family and social history reviewed and updated as indicated. Interim medical history since our last visit reviewed. Allergies and medications reviewed and  updated.  Review of Systems  Constitutional:  Negative for activity change, appetite change, diaphoresis, fatigue and fever.  Respiratory:  Negative for cough, chest tightness and shortness of breath.   Cardiovascular:  Negative for chest pain, palpitations and leg swelling.  Gastrointestinal:  Positive for abdominal pain. Negative for abdominal distention, blood in stool, constipation, diarrhea, nausea and vomiting.  Endocrine: Negative for polydipsia, polyphagia and polyuria.  Neurological: Negative.   Psychiatric/Behavioral: Negative.      Per HPI unless specifically indicated above     Objective:    BP 110/70   Pulse 64   Temp 98.2 F (36.8 C) (Oral)   Ht '5\' 5"'$  (1.651 m)   Wt 158 lb (71.7 kg)   SpO2 96%   BMI 26.29 kg/m   Wt Readings from Last 3 Encounters:  07/18/22 158 lb (71.7 kg)  05/31/22 160 lb 12.8 oz (72.9 kg)  04/03/22 160 lb 15 oz (73 kg)    Physical Exam Vitals and nursing note reviewed.  Constitutional:      General: She is awake. She is not in acute distress.    Appearance: She is well-developed, well-groomed and overweight. She is not ill-appearing.  HENT:     Head: Normocephalic.     Right Ear: Hearing normal.     Left Ear: Hearing normal.  Eyes:     General: Lids are normal.        Right eye: No  discharge.        Left eye: No discharge.     Conjunctiva/sclera: Conjunctivae normal.     Pupils: Pupils are equal, round, and reactive to light.  Neck:     Thyroid: No thyromegaly.     Vascular: Carotid bruit (bilateral) present.  Cardiovascular:     Rate and Rhythm: Normal rate and regular rhythm.     Heart sounds: Normal heart sounds. No murmur heard.    No gallop.  Pulmonary:     Effort: Pulmonary effort is normal. No accessory muscle usage or respiratory distress.     Breath sounds: No wheezing or rhonchi.  Abdominal:     General: Bowel sounds are normal. There is no distension.     Palpations: Abdomen is soft.     Tenderness: There is  abdominal tenderness in the left upper quadrant and left lower quadrant. There is no right CVA tenderness, left CVA tenderness, guarding or rebound.     Hernia: No hernia is present.     Comments: Pain mainly noted along lower psoas aspect.  Musculoskeletal:     Cervical back: Normal range of motion and neck supple.     Right lower leg: No edema.     Left lower leg: No edema.  Skin:    General: Skin is warm and dry.  Neurological:     Mental Status: She is alert and oriented to person, place, and time.  Psychiatric:        Attention and Perception: Attention normal.        Mood and Affect: Mood normal.        Speech: Speech normal.        Behavior: Behavior normal. Behavior is cooperative.        Thought Content: Thought content normal.     Results for orders placed or performed in visit on 05/31/22  Bayer DCA Hb A1c Waived  Result Value Ref Range   HB A1C (BAYER DCA - WAIVED) 7.6 (H) 4.8 - 5.6 %  [id number] 11+Oxyco+Alc+Crt-Bund  Result Value Ref Range   Ethanol CANCELED %   Amphetamines, Urine CANCELED    Barbiturate CANCELED    BENZODIAZ UR QL CANCELED    Cannabinoid Quant, Ur CANCELED    Cocaine (Metabolite) CANCELED    OPIATE SCREEN URINE CANCELED    Oxycodone/Oxymorphone, Urine CANCELED    Phencyclidine CANCELED    Methadone Screen, Urine CANCELED    Propoxyphene CANCELED    Meperidine CANCELED    Tramadol CANCELED       Assessment & Plan:   Problem List Items Addressed This Visit       Other   LUQ abdominal pain - Primary    Acute and ongoing for one month, only presents first thing in morning or when rolling over in bed.  Suspect more musculoskeletal.  Recommend they obtain a new mattress, current is quite old per her report -- with her RA may need more support.  Will send in Tizanidine to take only as needed, low dose 2 MG -- if pain in morning may take this.  Continue Tylenol at night.  Check labs today CMP, Amylase, Lipase.  If worsening or ongoing then consider  imaging.      Relevant Orders   Urinalysis, Routine w reflex microscopic   Comprehensive metabolic panel   Amylase   Lipase     Follow up plan: Return if symptoms worsen or fail to improve.

## 2022-07-19 LAB — COMPREHENSIVE METABOLIC PANEL
ALT: 23 IU/L (ref 0–32)
AST: 22 IU/L (ref 0–40)
Albumin/Globulin Ratio: 2.1 (ref 1.2–2.2)
Albumin: 4.6 g/dL (ref 3.8–4.8)
Alkaline Phosphatase: 150 IU/L — ABNORMAL HIGH (ref 44–121)
BUN/Creatinine Ratio: 19 (ref 12–28)
BUN: 19 mg/dL (ref 8–27)
Bilirubin Total: 0.5 mg/dL (ref 0.0–1.2)
CO2: 22 mmol/L (ref 20–29)
Calcium: 9.8 mg/dL (ref 8.7–10.3)
Chloride: 103 mmol/L (ref 96–106)
Creatinine, Ser: 1.02 mg/dL — ABNORMAL HIGH (ref 0.57–1.00)
Globulin, Total: 2.2 g/dL (ref 1.5–4.5)
Glucose: 187 mg/dL — ABNORMAL HIGH (ref 70–99)
Potassium: 4.4 mmol/L (ref 3.5–5.2)
Sodium: 141 mmol/L (ref 134–144)
Total Protein: 6.8 g/dL (ref 6.0–8.5)
eGFR: 57 mL/min/{1.73_m2} — ABNORMAL LOW (ref >59)

## 2022-07-19 LAB — LIPASE: Lipase: 420 U/L — ABNORMAL HIGH (ref 14–85)

## 2022-07-19 LAB — AMYLASE: Amylase: 157 U/L — ABNORMAL HIGH (ref 31–110)

## 2022-07-19 NOTE — Progress Notes (Signed)
Contacted via [place] -- please ensure she receive below message Good evening Laura Rojas, your labs have returned: - Kidney function, creatinine and eGFR continues to show mild kidney disease with no worsening, we will continue to monitor.  AST and ALT, liver testing - is normal. - Your pancreas labs are elevated, lipase and amylase, I am concerned that pain you are experiencing may be some pancreatitis. I would like to obtain a CT scan of belly to further assess and will order this.  They will call to schedule.  For now stick to a VERY bland diet, nothing heavy or greasy or this may irritate pancreas.  No alcohol either. If any worsening pain or symptoms then immediately go to ER as you may need IV fluids if worsening.  Any questions about this? Keep being amazing!!  Thank you for allowing me to participate in your care.  I appreciate you. Kindest regards, Arland Usery

## 2022-07-19 NOTE — Addendum Note (Signed)
Addended by: Venita Lick on: 07/19/2022 05:38 PM   Modules accepted: Orders

## 2022-07-28 ENCOUNTER — Ambulatory Visit
Admission: RE | Admit: 2022-07-28 | Discharge: 2022-07-28 | Disposition: A | Discharge status: Home / Self Care | Payer: Medicare Other | Source: Ambulatory Visit | Attending: Nurse Practitioner | Admitting: Nurse Practitioner

## 2022-07-28 DIAGNOSIS — K8500 Idiopathic acute pancreatitis without necrosis or infection: Secondary | ICD-10-CM

## 2022-07-28 DIAGNOSIS — R1012 Left upper quadrant pain: Secondary | ICD-10-CM | POA: Diagnosis not present

## 2022-07-28 DIAGNOSIS — I700 Atherosclerosis of aorta: Secondary | ICD-10-CM | POA: Diagnosis not present

## 2022-07-30 ENCOUNTER — Encounter: Payer: Self-pay | Admitting: Nurse Practitioner

## 2022-07-30 DIAGNOSIS — I700 Atherosclerosis of aorta: Secondary | ICD-10-CM | POA: Insufficient documentation

## 2022-07-30 DIAGNOSIS — K429 Umbilical hernia without obstruction or gangrene: Secondary | ICD-10-CM | POA: Insufficient documentation

## 2022-07-30 NOTE — Progress Notes (Signed)
Contacted via [place] morning Georgetta, your imaging has returned.  Overall pancreas is normal, we will recheck labs next visit.  Your gall bladder is showing multiple stones, but no inflammation -- at times this can cause elevation in pancreas levels on labs.  So we will monitor and if ongoing pain or abnormal labs consider a general surgery referral.  Overall no acute concerns noted on imaging.  How are you feeling?  Any questions?   Keep being amazing!!  Thank you for allowing me to participate in your care.  I appreciate you. Kindest regards, Gizella [place]

## 2022-08-03 DIAGNOSIS — M0609 Rheumatoid arthritis without rheumatoid factor, multiple sites: Secondary | ICD-10-CM | POA: Diagnosis not present

## 2022-08-16 ENCOUNTER — Ambulatory Visit (INDEPENDENT_AMBULATORY_CARE_PROVIDER_SITE_OTHER): Payer: Medicare Other | Admitting: Dermatology

## 2022-08-16 DIAGNOSIS — L814 Other melanin hyperpigmentation: Secondary | ICD-10-CM | POA: Diagnosis not present

## 2022-08-16 DIAGNOSIS — L2089 Other atopic dermatitis: Secondary | ICD-10-CM

## 2022-08-16 DIAGNOSIS — L578 Other skin changes due to chronic exposure to nonionizing radiation: Secondary | ICD-10-CM

## 2022-08-16 DIAGNOSIS — I2583 Coronary atherosclerosis due to lipid rich plaque: Secondary | ICD-10-CM

## 2022-08-16 DIAGNOSIS — L859 Epidermal thickening, unspecified: Secondary | ICD-10-CM | POA: Diagnosis not present

## 2022-08-16 DIAGNOSIS — D492 Neoplasm of unspecified behavior of bone, soft tissue, and skin: Secondary | ICD-10-CM | POA: Diagnosis not present

## 2022-08-16 DIAGNOSIS — L820 Inflamed seborrheic keratosis: Secondary | ICD-10-CM | POA: Diagnosis not present

## 2022-08-16 DIAGNOSIS — L821 Other seborrheic keratosis: Secondary | ICD-10-CM

## 2022-08-16 DIAGNOSIS — D229 Melanocytic nevi, unspecified: Secondary | ICD-10-CM | POA: Diagnosis not present

## 2022-08-16 DIAGNOSIS — D1801 Hemangioma of skin and subcutaneous tissue: Secondary | ICD-10-CM

## 2022-08-16 DIAGNOSIS — L853 Xerosis cutis: Secondary | ICD-10-CM

## 2022-08-16 DIAGNOSIS — Z1283 Encounter for screening for malignant neoplasm of skin: Secondary | ICD-10-CM

## 2022-08-16 DIAGNOSIS — I2510 Atherosclerotic heart disease of native coronary artery without angina pectoris: Secondary | ICD-10-CM

## 2022-08-16 MED ORDER — MOMETASONE FUROATE 0.1 % EX CREA: 1.0000 | TOPICAL_CREAM | Freq: Every day | CUTANEOUS | 2 refills | Status: DC | PRN

## 2022-08-16 NOTE — Patient Instructions (Addendum)
.Gentle Skin Care Guide  1. Bathe no more than once a day.  2. Avoid bathing in hot water  3. Use a mild soap like Dove.  4. Use soap only where you need it. On most days, use it under your arms, between your legs, and on your feet. Let the water rinse other areas unless visibly dirty.  5. When you get out of the bath/shower, use a towel to gently blot your skin dry, don't rub it.  6. While your skin is still a little damp, apply a moisturizing cream such as CeraVe cream  7. Reapply moisturizer any time you start to itch or feel dry.  8. Sometimes using free and clear laundry detergents can be helpful. Fabric softener sheets should be avoided. Downy Free & Gentle liquid, or any liquid fabric softener that is free of dyes and perfumes, it acceptable to use  9. If your doctor has given you prescription creams you may apply moisturizers over them     Wound Care Instructions  Cleanse wound gently with soap and water once a day then pat dry with clean gauze. Apply a thin coat of Petrolatum (petroleum jelly, "Vaseline") over the wound (unless you have an allergy to this). We recommend that you use a new, sterile tube of Vaseline. Do not pick or remove scabs. Do not remove the yellow or white "healing tissue" from the base of the wound.  Cover the wound with fresh, clean, nonstick gauze and secure with paper tape. You may use Band-Aids in place of gauze and tape if the wound is small enough, but would recommend trimming much of the tape off as there is often too much. Sometimes Band-Aids can irritate the skin.  You should call the office for your biopsy report after 1 week if you have not already been contacted.  If you experience any problems, such as abnormal amounts of bleeding, swelling, significant bruising, significant pain, or evidence of infection, please call the office immediately.  FOR ADULT SURGERY PATIENTS: If you need something for pain relief you may take 1 extra strength  Tylenol (acetaminophen) AND 2 Ibuprofen ('200mg'$  each) together every 4 hours as needed for pain. (do not take these if you are allergic to them or if you have a reason you should not take them.) Typically, you may only need pain medication for 1 to 3 days.       Due to recent changes in healthcare laws, you may see results of your pathology and/or laboratory studies on MyChart before the doctors have had a chance to review them. We understand that in some cases there may be results that are confusing or concerning to you. Please understand that not all results are received at the same time and often the doctors may need to interpret multiple results in order to provide you with the best plan of care or course of treatment. Therefore, we ask that you please give [place] 2 business days to thoroughly review all your results before contacting the office for clarification. Should we see a critical lab result, you will be contacted sooner.   If You Need Anything After Your Visit  If you have any questions or concerns for your doctor, please call our main line at [phone] and press option 4 to reach your doctor's medical assistant. If no one answers, please leave a voicemail as directed and we will return your call as soon as possible. Messages left after 4 pm will be answered the following business day.  You may also send [place] a message via [place]. We typically respond to MyChart messages within 1-2 business days.  For prescription refills, please ask your pharmacy to contact our office. Our fax number is [phone].  If you have an urgent issue when the clinic is closed that cannot wait until the next business day, you can page your doctor at the number below.    Please note that while we do our best to be available for urgent issues outside of office hours, we are not available 24/7.   If you have an urgent issue and are unable to reach [place], you may choose to seek medical care at your doctor's  office, retail clinic, urgent care center, or emergency room.  If you have a medical emergency, please immediately call 911 or go to the emergency department.  Pager Numbers  - Dr. Nehemiah Massed: [phone]  - Dr. Laurence Ferrari: [phone]  - Dr. Nicole Kindred: [phone]  In the event of inclement weather, please call our main line at [phone] for an update on the status of any delays or closures.  Dermatology Medication Tips: Please keep the boxes that topical medications come in in order to help keep track of the instructions about where and how to use these. Pharmacies typically print the medication instructions only on the boxes and not directly on the medication tubes.   If your medication is too expensive, please contact our office at [phone] option 4 or send [place] a message through [place].   We are unable to tell what your co-pay for medications will be in advance as this is different depending on your insurance coverage. However, we may be able to find a substitute medication at lower cost or fill out paperwork to get insurance to cover a needed medication.   If a prior authorization is required to get your medication covered by your insurance company, please allow [place] 1-2 business days to complete this process.  Drug prices often vary depending on where the prescription is filled and some pharmacies may offer cheaper prices.  The website www.goodrx.com contains coupons for medications through different pharmacies. The prices here do not account for what the cost may be with help from insurance (it may be cheaper with your insurance), but the website can give you the price if you did not use any insurance.  - You can print the associated coupon and take it with your prescription to the pharmacy.  - You may also stop by our office during regular business hours and pick up a GoodRx coupon card.  - If you need your prescription sent electronically to a different pharmacy, notify our office  through [facility] or by phone at [phone] option 4.     Si Usted Necesita Algo Despus de Su Visita  Tambin puede enviarnos un mensaje a travs de Pharmacist, community. Por lo general respondemos a los mensajes de MyChart en el transcurso de 1 a 2 das hbiles.  Para renovar recetas, por favor pida a su farmacia que se ponga en contacto con nuestra oficina. Harland Dingwall de fax es [place] [phone].  Si tiene un asunto urgente cuando la clnica est cerrada y que no puede esperar hasta el siguiente da hbil, puede llamar/localizar a su doctor(a) al nmero que aparece a continuacin.   Por favor, tenga en cuenta que aunque hacemos todo lo posible para estar disponibles para asuntos urgentes fuera del horario de [place], no estamos disponibles las 24 horas del da, los 7 das de la [place].  Si tiene un problema urgente y no puede comunicarse con nosotros, puede optar por buscar atencin mdica  en el consultorio de su doctor(a), en una clnica privada, en un centro de atencin urgente o en una sala de emergencias.  Si tiene Engineering geologist, por favor llame inmediatamente al 911 o vaya a la sala de emergencias.  Nmeros de bper  - Dr. Nehemiah Massed: [phone]  - Dra. Moye: [phone]  - Dra. Nicole Kindred: [phone]  En caso de inclemencias del [place], por favor llame a Johnsie Kindred principal al [phone] para una actualizacin sobre el [place] de cualquier retraso o cierre.  Consejos para la medicacin en dermatologa: Por favor, guarde las cajas en las que vienen los medicamentos de uso tpico para ayudarle a seguir las instrucciones sobre dnde y cmo usarlos. Las farmacias generalmente imprimen las instrucciones del medicamento slo en las cajas y no directamente en los tubos del [place].   Si su medicamento es muy caro, por favor, pngase en contacto con Zigmund Daniel llamando al [phone] y presione la opcin 4 o envenos un mensaje a travs de Pharmacist, community.   No  podemos decirle cul ser su copago por los medicamentos por adelantado ya que esto es diferente dependiendo de la cobertura de su seguro. Sin embargo, es posible que podamos encontrar un medicamento sustituto a Electrical engineer un formulario para que el seguro cubra el medicamento que se considera necesario.   Si se requiere una autorizacin previa para que su compaa de seguros [place] su medicamento, por favor permtanos de 1 a 2 das hbiles para completar este proceso.  Los precios de los medicamentos varan con frecuencia dependiendo del Environmental consultant de dnde se surte la receta y alguna farmacias pueden ofrecer precios ms baratos.  El sitio web www.goodrx.com tiene cupones para medicamentos de Airline pilot. Los precios aqu no tienen en cuenta lo que podra costar con la ayuda del seguro (puede ser ms barato con su seguro), pero el sitio web puede darle el precio si no utiliz Research scientist (physical sciences).  - Puede imprimir el cupn correspondiente y llevarlo con su receta a la farmacia.  - Tambin puede pasar por nuestra oficina durante el horario de atencin regular y Charity fundraiser una tarjeta de cupones de GoodRx.  - Si necesita que su receta se enve electrnicamente a una farmacia diferente, informe a nuestra oficina a travs de MyChart de [place] o por telfono llamando al [phone] y presione la opcin 4.

## 2022-08-16 NOTE — Progress Notes (Unsigned)
Follow-Up Visit   Subjective  Laura Rojas is a 76 y.o. female who presents for the following: Annual Exam (Mole check ). Yearly skin check hx of ISK's.  The patient presents for Total-Body Skin Exam (TBSE) for skin cancer screening and mole check.  The patient has spots, moles and lesions to be evaluated, some may be new or changing and the patient has concerns that these could be cancer.   The following portions of the chart were reviewed this encounter and updated as appropriate:   Tobacco  Allergies  Meds  Problems  Med Hx  Surg Hx  Fam Hx     Review of Systems:  No other skin or systemic complaints except as noted in HPI or Assessment and Plan.  Objective  Well appearing patient in no apparent distress; mood and affect are within normal limits.  A full examination was performed including scalp, head, eyes, ears, nose, lips, neck, chest, axillae, abdomen, back, buttocks, bilateral upper extremities, bilateral lower extremities, hands, feet, fingers, toes, fingernails, and toenails. All findings within normal limits unless otherwise noted below.  upper back spinal Stuck-on, waxy, tan-brown papules and plaques -- Discussed benign etiology and prognosis.   body Mild pink patches  left superior forehead 0.7 cm crust patch        Assessment & Plan  Inflamed seborrheic keratosis upper back spinal Symptomatic, irritating, patient would like treated.  Destruction of lesion - upper back spinal Complexity: simple   Destruction method: cryotherapy   Informed consent: discussed and consent obtained   Timeout:  patient name, date of birth, surgical site, and procedure verified Lesion destroyed using liquid nitrogen: Yes   Region frozen until ice ball extended beyond lesion: Yes   Outcome: patient tolerated procedure well with no complications   Post-procedure details: wound care instructions given    Other atopic dermatitis body Atopic dermatitis with Pruritus   Atopic dermatitis (eczema) is a chronic, relapsing, pruritic condition that can significantly affect quality of life. It is often associated with allergic rhinitis and/or asthma and can require treatment with topical medications, phototherapy, or in severe cases biologic injectable medication (Dupixent; Adbry) or Oral JAK inhibitors.   May consider Dupixent in the future Start Mometasone cream apply to affected skin qd-bid  Related Medications mometasone (ELOCON) 0.1 % cream Apply 1 Application topically daily as needed (Rash).  Neoplasm of skin left superior forehead Epidermal / dermal shaving  Lesion diameter (cm):  0.7 Informed consent: discussed and consent obtained   Timeout: patient name, date of birth, surgical site, and procedure verified   Procedure prep:  Patient was prepped and draped in usual sterile fashion Prep type:  Isopropyl alcohol Anesthesia: the lesion was anesthetized in a standard fashion   Anesthetic:  1% lidocaine w/ epinephrine 1-100,000 buffered w/ 8.4% NaHCO3 Hemostasis achieved with: pressure, aluminum chloride and electrodesiccation   Outcome: patient tolerated procedure well   Post-procedure details: sterile dressing applied and wound care instructions given   Dressing type: bandage and petrolatum    Destruction of lesion  Destruction method: electrodesiccation and curettage   Informed consent: discussed and consent obtained   Timeout:  patient name, date of birth, surgical site, and procedure verified Anesthesia: the lesion was anesthetized in a standard fashion   Anesthetic:  1% lidocaine w/ epinephrine 1-100,000 buffered w/ 8.4% NaHCO3 Curettage performed in three different directions: Yes   Electrodesiccation performed over the curetted area: Yes   Curettage cycles:  3 Lesion length (cm):  0.7 Lesion  width (cm):  0.7 Margin per side (cm):  0.2 Final wound size (cm):  1.1 Hemostasis achieved with:  electrodesiccation Outcome: patient  tolerated procedure well with no complications   Post-procedure details: sterile dressing applied and wound care instructions given   Dressing type: petrolatum    Specimen 1 - Surgical pathology Differential Diagnosis: R/O Irritated Seborrheic keratosis  Check Margins: No  Lentigines - Scattered tan macules - Due to sun exposure - Benign-appearing, observe - Recommend daily broad spectrum sunscreen SPF 30+ to sun-exposed areas, reapply every 2 hours as needed. - Call for any changes  Seborrheic Keratoses - Stuck-on, waxy, tan-brown papules and/or plaques  - Benign-appearing - Discussed benign etiology and prognosis. - Observe - Call for any changes  Melanocytic Nevi - Tan-brown and/or pink-flesh-colored symmetric macules and papules - Benign appearing on exam today - Observation - Call clinic for new or changing moles - Recommend daily use of broad spectrum spf 30+ sunscreen to sun-exposed areas.   Hemangiomas - Red papules - Discussed benign nature - Observe - Call for any changes  Actinic Damage - Chronic condition, secondary to cumulative UV/sun exposure - diffuse scaly erythematous macules with underlying dyspigmentation - Recommend daily broad spectrum sunscreen SPF 30+ to sun-exposed areas, reapply every 2 hours as needed.  - Staying in the shade or wearing long sleeves, sun glasses (UVA+UVB protection) and wide brim hats (4-inch brim around the entire circumference of the hat) are also recommended for sun protection.  - Call for new or changing lesions.  Xerosis - diffuse xerotic patches - recommend gentle, hydrating skin care - gentle skin care handout given  Start Cerave cream apply to skin daily  Skin cancer screening performed today.   Return in about 1 year (around 08/17/2023) for TBSE, atopic dermatitis .  IMarye Round, CMA, am acting as scribe for Sarina Ser, MD .  Documentation: I have reviewed the above documentation for accuracy and  completeness, and I agree with the above.  Sarina Ser, MD

## 2022-08-17 ENCOUNTER — Encounter: Payer: Self-pay | Admitting: Dermatology

## 2022-08-17 ENCOUNTER — Telehealth: Payer: Self-pay

## 2022-08-17 NOTE — Telephone Encounter (Signed)
-----   Message from Ralene Bathe, MD sent at 08/17/2022  5:05 PM EDT ----- Diagnosis Skin , left superior forehead BENIGN EPIDERMAL HYPERPLASIA, SEE DESCRIPTION  Benign thickened skin No further treatment needed

## 2022-08-17 NOTE — Telephone Encounter (Signed)
Left pt msg to call for bx results/sh 

## 2022-08-18 ENCOUNTER — Other Ambulatory Visit: Payer: Self-pay | Admitting: Nurse Practitioner

## 2022-08-18 ENCOUNTER — Encounter: Payer: Self-pay | Admitting: Nurse Practitioner

## 2022-08-18 ENCOUNTER — Ambulatory Visit (INDEPENDENT_AMBULATORY_CARE_PROVIDER_SITE_OTHER): Payer: Medicare Other | Admitting: Nurse Practitioner

## 2022-08-18 DIAGNOSIS — R1012 Left upper quadrant pain: Secondary | ICD-10-CM | POA: Diagnosis not present

## 2022-08-18 NOTE — Assessment & Plan Note (Addendum)
Acute and ongoing for > one month, only presents first thing in morning or when rolling over in bed.  Suspect more musculoskeletal.  Recommend they obtain a new mattress, current is quite old per her report -- with her RA may need more support.  Sent in Tizanidine to take only as needed last visit, low dose 2 MG -- has not started and recommend she start taking 30 minutes before getting up in morning.  Continue Tylenol at night.  Labs were reassuring, CT scan gallstones (however do not suspect this is related to current discomfort).

## 2022-08-18 NOTE — Patient Instructions (Signed)

## 2022-08-18 NOTE — Telephone Encounter (Signed)
Requested medication (s) are due for refill today: yes  Requested medication (s) are on the active medication list: yes  Last refill:  05/31/2022 #180 with 0 RF  Future visit scheduled: no, seen 07/18/22  Notes to clinic:  This medication can not be delegated, please assess.        Requested Prescriptions  Pending Prescriptions Disp Refills   LORazepam (ATIVAN) 0.5 MG tablet [Pharmacy Med Name: LORAZEPAM 0.5 MG TABLET] 180 tablet 0    Sig: TAKE 1 TAB BY MOUTH TWICE A DAY (USE THE ADDITIONAL 30 TABS ONLY SPARINGLY & ONLY FOR SEVERE ANXIETY)     Not Delegated - Psychiatry: Anxiolytics/Hypnotics 2 Failed - 08/18/2022  9:11 AM      Failed - This refill cannot be delegated      Passed - Urine Drug Screen completed in last 360 days      Passed - Patient is not pregnant      Passed - Valid encounter within last 6 months    Recent Outpatient Visits           1 month ago LUQ abdominal pain   [place], Jolene T, NP   2 months ago Type 2 diabetes mellitus with hyperglycemia, with long-term current use of insulin ([place])   [place], Jolene T, NP   4 months ago Type 2 diabetes mellitus with hyperglycemia, with long-term current use of insulin ([place])   [place], Jolene T, NP   5 months ago Type 2 diabetes mellitus with hyperglycemia, with long-term current use of insulin ([place])   [place], Jolene T, NP   7 months ago Type 2 diabetes mellitus with hyperglycemia, with long-term current use of insulin ([place])   [place], Barbaraann Faster, NP       Future Appointments             Today Venita Lick, NP [place]   In 2 weeks  [facility], [place]   In 1 year Ralene Bathe, MD [place]

## 2022-08-18 NOTE — Progress Notes (Signed)
BP 131/63   Pulse 64   Temp (!) 97.5 F (36.4 C) (Oral)   Ht 5' 5"  (1.651 m)   Wt 156 lb 11.2 oz (71.1 kg)   SpO2 97%   BMI 26.08 kg/m    Subjective:    Patient ID: Laura Rojas, female    DOB: 11-Dec-1945, 76 y.o.   MRN: [id number]  HPI: Laura Rojas is a 76 y.o. female  Chief Complaint  Patient presents with   Pain    Patient says when she wakes up in the mornings is when she has excruciating pain and her husband has to sometimes help her out of the bed. Patient says she has had a CT scan about 3 weeks ago and it showed some gallstones.    ABDOMINAL PAIN  Has had ongoing LUQ pain for two months. Getting out of bed is very hard for her (stuff and painful to that left side), then an hour later it will go away.  Does have older mattress.  Does not notice pain any other time, then when getting out of bed in morning.  Does have RA followed by rheumatology, last visit 06/07/22 -- had injection treatment last on 08/03/22.  Taking Cimzia injections for RA, these have been offering benefit.  Only takes Tylenol at home when needed.   Duration:weeks Onset: sudden Severity: 10/10 for one hour and then slowly subsides Quality: sharp, dull, aching, and throbbing Location:  LUQ  Episode duration: one hour Radiation: no Frequency: intermittent Alleviating factors: nothing, it goes away on own Aggravating factors: getting up in morning -- does not notice throughout daytime Status: ongoing Treatments attempted: none Fever: no Nausea: no Vomiting: no Weight loss: no Decreased appetite: no Diarrhea: no Constipation: no Blood in stool: no Heartburn: no Jaundice: no Rash: no Dysuria/urinary frequency: no Hematuria: no History of sexually transmitted disease: no Recurrent NSAID use: no   Relevant past medical, surgical, family and social history reviewed and updated as indicated. Interim medical history since our last visit reviewed. Allergies and medications reviewed and  updated.  Review of Systems  Constitutional:  Negative for activity change, appetite change, diaphoresis, fatigue and fever.  Respiratory:  Negative for cough, chest tightness and shortness of breath.   Cardiovascular:  Negative for chest pain, palpitations and leg swelling.  Gastrointestinal:  Positive for abdominal pain. Negative for abdominal distention, blood in stool, constipation, diarrhea, nausea and vomiting.  Endocrine: Negative for polydipsia, polyphagia and polyuria.  Neurological: Negative.   Psychiatric/Behavioral: Negative.      Per HPI unless specifically indicated above     Objective:    BP 131/63   Pulse 64   Temp (!) 97.5 F (36.4 C) (Oral)   Ht 5' 5"  (1.651 m)   Wt 156 lb 11.2 oz (71.1 kg)   SpO2 97%   BMI 26.08 kg/m   Wt Readings from Last 3 Encounters:  08/18/22 156 lb 11.2 oz (71.1 kg)  07/18/22 158 lb (71.7 kg)  05/31/22 160 lb 12.8 oz (72.9 kg)    Physical Exam Vitals and nursing note reviewed.  Constitutional:      General: She is awake. She is not in acute distress.    Appearance: She is well-developed, well-groomed and overweight. She is not ill-appearing.  HENT:     Head: Normocephalic.     Right Ear: Hearing normal.     Left Ear: Hearing normal.  Eyes:     General: Lids are normal.  Right eye: No discharge.        Left eye: No discharge.     Conjunctiva/sclera: Conjunctivae normal.     Pupils: Pupils are equal, round, and reactive to light.  Neck:     Thyroid: No thyromegaly.     Vascular: Carotid bruit (bilateral) present.  Cardiovascular:     Rate and Rhythm: Normal rate and regular rhythm.     Heart sounds: Normal heart sounds. No murmur heard.    No gallop.  Pulmonary:     Effort: Pulmonary effort is normal. No accessory muscle usage or respiratory distress.     Breath sounds: No wheezing or rhonchi.  Abdominal:     General: Bowel sounds are normal. There is no distension.     Palpations: Abdomen is soft.      Tenderness: There is abdominal tenderness in the left upper quadrant and left lower quadrant. There is no right CVA tenderness, left CVA tenderness, guarding or rebound.     Hernia: No hernia is present.     Comments: Pain mainly noted along lower psoas and oblique.  Musculoskeletal:     Cervical back: Normal range of motion and neck supple.     Right lower leg: No edema.     Left lower leg: No edema.  Skin:    General: Skin is warm and dry.  Neurological:     Mental Status: She is alert and oriented to person, place, and time.  Psychiatric:        Attention and Perception: Attention normal.        Mood and Affect: Mood normal.        Speech: Speech normal.        Behavior: Behavior normal. Behavior is cooperative.        Thought Content: Thought content normal.     Results for orders placed or performed in visit on 07/18/22  Urinalysis, Routine w reflex microscopic  Result Value Ref Range   Specific Gravity, UA 1.020 1.005 - 1.030   pH, UA 6.5 5.0 - 7.5   Color, UA Yellow Yellow   Appearance Ur Clear Clear   Leukocytes,UA Negative Negative   Protein,UA Negative Negative/Trace   Glucose, UA Negative Negative   Ketones, UA Negative Negative   RBC, UA Negative Negative   Bilirubin, UA Negative Negative   Urobilinogen, Ur 1.0 0.2 - 1.0 mg/dL   Nitrite, UA Negative Negative  Comprehensive metabolic panel  Result Value Ref Range   Glucose 187 (H) 70 - 99 mg/dL   BUN 19 8 - 27 mg/dL   Creatinine, Ser 1.02 (H) 0.57 - 1.00 mg/dL   eGFR 57 (L) >59 mL/min/1.73   BUN/Creatinine Ratio 19 12 - 28   Sodium 141 134 - 144 mmol/L   Potassium 4.4 3.5 - 5.2 mmol/L   Chloride 103 96 - 106 mmol/L   CO2 22 20 - 29 mmol/L   Calcium 9.8 8.7 - 10.3 mg/dL   Total Protein 6.8 6.0 - 8.5 g/dL   Albumin 4.6 3.8 - 4.8 g/dL   Globulin, Total 2.2 1.5 - 4.5 g/dL   Albumin/Globulin Ratio 2.1 1.2 - 2.2   Bilirubin Total 0.5 0.0 - 1.2 mg/dL   Alkaline Phosphatase 150 (H) 44 - 121 IU/L   AST 22 0 - 40  IU/L   ALT 23 0 - 32 IU/L  Amylase  Result Value Ref Range   Amylase 157 (H) 31 - 110 U/L  Lipase  Result Value Ref Range  Lipase 420 (H) 14 - 85 U/L      Assessment & Plan:   Problem List Items Addressed This Visit       Other   LUQ abdominal pain    Acute and ongoing for > one month, only presents first thing in morning or when rolling over in bed.  Suspect more musculoskeletal.  Recommend they obtain a new mattress, current is quite old per her report -- with her RA may need more support.  Sent in Tizanidine to take only as needed last visit, low dose 2 MG -- has not started and recommend she start taking 30 minutes before getting up in morning.  Continue Tylenol at night.  Labs were reassuring, CT scan gallstones (however do not suspect this is related to current discomfort).         Follow up plan: Return in about 2 weeks (around 09/01/2022) for T2DM, HTN/HLD, MOOD, RA, CKD.

## 2022-08-21 ENCOUNTER — Telehealth: Payer: Self-pay

## 2022-08-21 NOTE — Telephone Encounter (Signed)
-----   Message from Ralene Bathe, MD sent at 08/17/2022  5:05 PM EDT ----- Diagnosis Skin , left superior forehead BENIGN EPIDERMAL HYPERPLASIA, SEE DESCRIPTION  Benign thickened skin No further treatment needed

## 2022-08-21 NOTE — Telephone Encounter (Signed)
Notified of bx results

## 2022-08-24 DIAGNOSIS — E119 Type 2 diabetes mellitus without complications: Secondary | ICD-10-CM | POA: Diagnosis not present

## 2022-08-24 DIAGNOSIS — M2011 Hallux valgus (acquired), right foot: Secondary | ICD-10-CM | POA: Diagnosis not present

## 2022-08-24 DIAGNOSIS — M2041 Other hammer toe(s) (acquired), right foot: Secondary | ICD-10-CM | POA: Diagnosis not present

## 2022-08-24 DIAGNOSIS — M0609 Rheumatoid arthritis without rheumatoid factor, multiple sites: Secondary | ICD-10-CM | POA: Diagnosis not present

## 2022-08-30 ENCOUNTER — Other Ambulatory Visit: Payer: Self-pay | Admitting: Nurse Practitioner

## 2022-08-30 NOTE — Telephone Encounter (Signed)
Requested Prescriptions  Pending Prescriptions Disp Refills  . NOVOLOG FLEXPEN 100 UNIT/ML FlexPen [Pharmacy Med Name: NOVOLOG 100 UNIT/ML FLEXPEN] 15 mL 2    Sig: INJECT 10 UNITS INTO THE SKIN 3 (THREE) TIMES DAILY. INJECT PER SLIDING SCALE REGIMEN.     Endocrinology:  Diabetes - Insulins Passed - 08/30/2022 11:16 AM      Passed - HBA1C is between 0 and 7.9 and within 180 days    Hemoglobin A1C  Date Value Ref Range Status  12/31/2019 8.2  Final   HB A1C (BAYER DCA - WAIVED)  Date Value Ref Range Status  05/31/2022 7.6 (H) 4.8 - 5.6 % Final    Comment:             Prediabetes: 5.7 - 6.4          Diabetes: >6.4          Glycemic control for adults with diabetes: <7.0          Passed - Valid encounter within last 6 months    Recent Outpatient Visits          1 week ago LUQ abdominal pain   [place], [place] T, NP   1 month ago LUQ abdominal pain   [place], [place] T, NP   3 months ago Type 2 diabetes mellitus with hyperglycemia, with long-term current use of insulin ([place])   [place], Jolene T, NP   5 months ago Type 2 diabetes mellitus with hyperglycemia, with long-term current use of insulin ([place])   [place], Jolene T, NP   6 months ago Type 2 diabetes mellitus with hyperglycemia, with long-term current use of insulin ([place])   [place], Barbaraann Faster, NP      Future Appointments            In 5 days  [facility], [place]   In 1 week [place], Barbaraann Faster, NP [facility]   In 12 months Ralene Bathe, MD [place]

## 2022-08-31 ENCOUNTER — Ambulatory Visit: Payer: Medicare Other | Admitting: Dermatology

## 2022-08-31 DIAGNOSIS — M0609 Rheumatoid arthritis without rheumatoid factor, multiple sites: Secondary | ICD-10-CM | POA: Diagnosis not present

## 2022-09-01 ENCOUNTER — Ambulatory Visit: Payer: Medicare Other | Admitting: Nurse Practitioner

## 2022-09-03 NOTE — Patient Instructions (Signed)
Diabetes Mellitus Basics  Diabetes mellitus, or diabetes, is a long-term (chronic) disease. It occurs when the body does not properly use sugar (glucose) that is released from food after you eat. Diabetes mellitus may be caused by one or both of these problems: Your pancreas does not make enough of a hormone called insulin. Your body does not react in a normal way to the insulin that it makes. Insulin lets glucose enter cells in your body. This gives you energy. If you have diabetes, glucose cannot get into cells. This causes high blood glucose (hyperglycemia). How to treat and manage diabetes You may need to take insulin or other diabetes medicines daily to keep your glucose in balance. If you are prescribed insulin, you will learn how to give yourself insulin by injection. You may need to adjust the amount of insulin you take based on the foods that you eat. You will need to check your blood glucose levels using a glucose monitor as told by your health care provider. The readings can help determine if you have low or high blood glucose. Generally, you should have these blood glucose levels: Before meals (preprandial): 80-130 mg/dL (4.4-7.2 mmol/L). After meals (postprandial): below 180 mg/dL (10 mmol/L). Hemoglobin A1c (HbA1c) level: less than 7%. Your health care provider will set treatment goals for you. Keep all follow-up visits. This is important. Follow these instructions at home: Diabetes medicines Take your diabetes medicines every day as told by your health care provider. List your diabetes medicines here: Name of medicine: ______________________________ Amount (dose): _______________ Time (a.m./p.m.): _______________ Notes: ___________________________________ Name of medicine: ______________________________ Amount (dose): _______________ Time (a.m./p.m.): _______________ Notes: ___________________________________ Name of medicine: ______________________________ Amount (dose):  _______________ Time (a.m./p.m.): _______________ Notes: ___________________________________ Insulin If you use insulin, list the types of insulin you use here: Insulin type: ______________________________ Amount (dose): _______________ Time (a.m./p.m.): _______________Notes: ___________________________________ Insulin type: ______________________________ Amount (dose): _______________ Time (a.m./p.m.): _______________ Notes: ___________________________________ Insulin type: ______________________________ Amount (dose): _______________ Time (a.m./p.m.): _______________ Notes: ___________________________________ Insulin type: ______________________________ Amount (dose): _______________ Time (a.m./p.m.): _______________ Notes: ___________________________________ Insulin type: ______________________________ Amount (dose): _______________ Time (a.m./p.m.): _______________ Notes: ___________________________________ Managing blood glucose  Check your blood glucose levels using a glucose monitor as told by your health care provider. Write down the times that you check your glucose levels here: Time: _______________ Notes: ___________________________________ Time: _______________ Notes: ___________________________________ Time: _______________ Notes: ___________________________________ Time: _______________ Notes: ___________________________________ Time: _______________ Notes: ___________________________________ Time: _______________ Notes: ___________________________________  Low blood glucose Low blood glucose (hypoglycemia) is when glucose is at or below 70 mg/dL (3.9 mmol/L). Symptoms may include: Feeling: Hungry. Sweaty and clammy. Irritable or easily upset. Dizzy. Sleepy. Having: A fast heartbeat. A headache. A change in your vision. Numbness around the mouth, lips, or tongue. Having trouble with: Moving (coordination). Sleeping. Treating low blood glucose To treat low blood  glucose, eat or drink something containing sugar right away. If you can think clearly and swallow safely, follow the 15:15 rule: Take 15 grams of a fast-acting carb (carbohydrate), as told by your health care provider. Some fast-acting carbs are: Glucose tablets: take 3-4 tablets. Hard candy: eat 3-5 pieces. Fruit juice: drink 4 oz (120 mL). Regular (not diet) soda: drink 4-6 oz (120-180 mL). Honey or sugar: eat 1 Tbsp (15 mL). Check your blood glucose levels 15 minutes after you take the carb. If your glucose is still at or below 70 mg/dL (3.9 mmol/L), take 15 grams of a carb again. If your glucose does not go above 70 mg/dL (3.9 mmol/L) after   3 tries, get help right away. After your glucose goes back to normal, eat a meal or a snack within 1 hour. Treating very low blood glucose If your glucose is at or below 54 mg/dL (3 mmol/L), you have very low blood glucose (severe hypoglycemia). This is an emergency. Do not wait to see if the symptoms will go away. Get medical help right away. Call your local emergency services (911 in the [place] Do not drive yourself to the hospital. Questions to ask your health care provider Should I talk with a diabetes educator? What equipment will I need to care for myself at home? What diabetes medicines do I need? When should I take them? How often do I need to check my blood glucose levels? What number can I call if I have questions? When is my follow-up visit? Where can I find a support group for people with diabetes? Where to find more information American Diabetes Association: www.diabetes.org Association of Diabetes Care and Education Specialists: www.diabeteseducator.org Contact a health care provider if: Your blood glucose is at or above 240 mg/dL (13.3 mmol/L) for 2 days in a row. You have been sick or have had a fever for 2 days or more, and you are not getting better. You have any of these problems for more than 6 hours: You cannot eat or  drink. You feel nauseous. You vomit. You have diarrhea. Get help right away if: Your blood glucose is lower than 54 mg/dL (3 mmol/L). You get confused. You have trouble thinking clearly. You have trouble breathing. These symptoms may represent a serious problem that is an emergency. Do not wait to see if the symptoms will go away. Get medical help right away. Call your local emergency services (911 in the [place] Do not drive yourself to the hospital. Summary Diabetes mellitus is a chronic disease that occurs when the body does not properly use sugar (glucose) that is released from food after you eat. Take insulin and diabetes medicines as told. Check your blood glucose every day, as often as told. Keep all follow-up visits. This is important. This information is not intended to replace advice given to you by your health care provider. Make sure you discuss any questions you have with your health care provider. Document Revised: 03/16/2020 Document Reviewed: 03/16/2020 Elsevier Patient Education  2023 [employer]  

## 2022-09-04 ENCOUNTER — Ambulatory Visit: Payer: Medicare Other

## 2022-09-04 ENCOUNTER — Ambulatory Visit (INDEPENDENT_AMBULATORY_CARE_PROVIDER_SITE_OTHER): Payer: Medicare Other | Admitting: *Deleted

## 2022-09-04 DIAGNOSIS — Z0000 Encounter for general adult medical examination without abnormal findings: Secondary | ICD-10-CM

## 2022-09-04 DIAGNOSIS — Z1231 Encounter for screening mammogram for malignant neoplasm of breast: Secondary | ICD-10-CM

## 2022-09-04 NOTE — Patient Instructions (Signed)
[name] , Thank you for taking time to come for your Medicare Wellness Visit. I appreciate your ongoing commitment to your health goals. Please review the following plan we discussed and let me know if I can assist you in the future.   These are the goals we discussed:  Goals       Increase water intake      Recommend drinking at least 6-8 glasses of water a day       Patient Stated      08/30/2020, wants to manage diabetes      Patient Stated      09/02/2021, no goals      Patient Stated      [place] (see longitudinal plan of care for additional care plan information)  Current Barriers:  Chronic Disease Management support, education, and care coordination needs related to Hypertension, Hyperlipidemia, Diabetes, Coronary Artery Disease, GERD, Chronic Kidney Disease, Depression, Anxiety, and Rheumatoid Arthritis   Hypertension BP Readings from Last 3 Encounters:  07/07/20 136/77  05/04/20 130/65  04/13/20 134/73  Pharmacist Clinical Goal(s): Over the next 90 days, patient will work with PharmD and providers to maintain BP goal <130/80 Current regimen:  Amlodipine 2.5 mg daily HCTZ 12.5 mg daily Losartan 100 mg daily Interventions: Provided diet and exercise counseling Discussed importance of self monitoring to ensure control Patient self care activities - Over the next 90  days, patient will: Check your blood pressure ~1-2  times weekly. Make sure you are sitting down for at least 5 minutes before, resting calmly, with your feet flat on the floor. Write down these readings to review at future appointments. Ensure daily salt intake < 2300 mg/day   Diabetes Lab Results  Component Value Date/Time   HGBA1C 7.0 (H) 07/07/2020 10:36 AM   HGBA1C 8.2 12/31/2019 12:00 AM   HGBA1C 8.8% 09/24/2019 12:00 AM  Pharmacist Clinical Goal(s): Over the next 90 days, patient will work with PharmD and providers to achieve A1c goal  <7% Current regimen:  Metformin 500 mg bid  Basaglar 20 unit qpm Novolog 18 units with meals + SS Interventions: Provided diet and exercise counseling. Reviewed 15-15 for treating hypoglycemia Patient self care activities - Over the next 90 days, patient will: Check blood sugar 4-6 times daily , document, and provide at future appointments Contact provider with any episodes of hypoglycemia   Medication management Pharmacist Clinical Goal(s): Over the next 90 days, patient will work with PharmD and providers to achieve optimal medication adherence Current pharmacy: [facility] Interventions Comprehensive medication review performed. Continue current medication management strategy Patient self care activities - Over the next 90 days, patient will: Focus on medication adherence by fill dates Take medications as prescribed Report any questions or concerns to PharmD and/or provider(s)  Initial goal documentation       PharmD "My sugars run high" (pt-stated)      Laura Rojas (see longtitudinal plan of care for additional care plan information)  Current Barriers:  Diabetes: uncontrolled; complicated by chronic medical conditions including HTN, CAD/PAD (hx cardiac stenting); most recent A1c 7.8%; follows w/ [facility] Reports she had a great birthday weekend. She had time with her family, and she and her husband went up to the [facility] for the night on Sunday. Notes she didn't follow the most diabetes-appropriate diet, but enjoyed her time Current antihyperglycemic regimen: metformin 500 mg BID, Basaglar 20 units  daily- but only using 14 units, Novolog 18 units base + sliding scale TID (2 extra units for every 50 mg/dL over 150) -> 22 units usually  Denies any hypoglycemia since she self-reduced Basaglar to 14 units Current glucose readings:  Fasting readings: 200s 2 hours after meals: usually <180 Average Glucose: 167 - last 14 days Time in Goal:  - Time in range (range is set  at 100-150): 21% - Time above range: 73% - Time below range: 6% Cardiovascular risk reduction: (followed by Dr. Orene Desanctis) Current hypertensive regimen: amlodipine 2.5 mg daily, HCTZ 12.5 mg daily, losartan 100 mg daily Current hyperlipidemia regimen: simvastatin 40 mg daily; last LDL at goal <70 Current antiplatelet regimen: ASA 81 mg daily + clopidogrel 75 mg daily Depression/anxiety: sertraline '150mg'$  daily, lorazepam 0.5 mg, taking 2 pm for sleep. Requests a refill on sertraline today before she goes out of town this weekend. Reports improvement in daytime anxiety w/ increased dose of sertraline PMR: [facility]. MTX 25 mg weekly, folic acid 1 mg daily. Follows w/ Dr. Meda Coffee  Pharmacist Clinical Goal(s):  Over the next 90 days, patient will work with PharmD and primary care provider to address optimized   Interventions: Comprehensive medication review performed, medication list updated in electronic medical record Inter-disciplinary care team collaboration (see longitudinal plan of care) Walked through changing Libre CGM target goal to 80-180 for better interpretation of reading Reviewed that she is not taking Basaglar as directed d/t worries about hypoglycemia after supper/early evening. Reviewed that we do not want fastings >200 as they are now. Encouraged to start by increasing Basaglar to 16 units, but decrease supper Novolog to ~18-20 units, instead of 22. She verbalized understanding.  Encouraged continued collaboration and calls to me with questions, rather than self-adjusting insulin.   Patient Self Care Activities:  Patient will check blood glucose at least QID using FreeStyle Libre , document, and provide at future appointments Patient will take medications as prescribed Patient will report any questions or concerns to provider   Please see past updates related to this goal by clicking on the "Past Updates" button in the selected goal          This is a list of the  screening recommended for you and due dates:  Health Maintenance  Topic Date Due   Flu Shot  06/27/2022   COVID-19 Vaccine (4 - Pfizer risk series) 09/20/2022*   Eye exam for diabetics  11/30/2022   Hemoglobin A1C  12/01/2022   Yearly kidney health urinalysis for diabetes  02/22/2023   Complete foot exam   02/22/2023   Yearly kidney function blood test for diabetes  07/19/2023   DEXA scan (bone density measurement)  05/13/2024   Colon Cancer Screening  08/18/2024   Tetanus Vaccine  03/17/2030   Pneumonia Vaccine  Completed   Hepatitis C Screening: USPSTF Recommendation to screen - Ages 40-79 yo.  Completed   Zoster (Shingles) Vaccine  Completed   HPV Vaccine  Aged Out  *Topic was postponed. The date shown is not the original due date.    Advanced directives: yes  not on file  Conditions/risks identified: diabetes   Preventive Care 18 Years and Older, Female Preventive care refers to lifestyle choices and visits with your health care provider that can promote health and wellness. What does preventive care include? A yearly physical exam. This is also called an annual well check. Dental exams once or twice a year. Routine eye exams. Ask your health care provider how often  you should have your eyes checked. Personal lifestyle choices, including: Daily care of your teeth and gums. Regular physical activity. Eating a healthy diet. Avoiding tobacco and drug use. Limiting alcohol use. Practicing safe sex. Taking low-dose aspirin every day. Taking vitamin and mineral supplements as recommended by your health care provider. What happens during an annual well check? The services and screenings done by your health care provider during your annual well check will depend on your age, overall health, lifestyle risk factors, and family history of disease. Counseling  Your health care provider may ask you questions about your: Alcohol use. Tobacco use. Drug use. Emotional  well-being. Home and relationship well-being. Sexual activity. Eating habits. History of falls. Memory and ability to understand (cognition). Work and work Statistician. Reproductive health. Screening  You may have the following tests or measurements: Height, weight, and BMI. Blood pressure. Lipid and cholesterol levels. These may be checked every 5 years, or more frequently if you are over 58 years old. Skin check. Lung cancer screening. You may have this screening every year starting at age 73 if you have a 30-pack-year history of smoking and currently smoke or have quit within the past 15 years. Fecal occult blood test (FOBT) of the stool. You may have this test every year starting at age 82. Flexible sigmoidoscopy or colonoscopy. You may have a sigmoidoscopy every 5 years or a colonoscopy every 10 years starting at age 23. Hepatitis C blood test. Hepatitis B blood test. Sexually transmitted disease (STD) testing. Diabetes screening. This is done by checking your blood sugar (glucose) after you have not eaten for a while (fasting). You may have this done every 1-3 years. Bone density scan. This is done to screen for osteoporosis. You may have this done starting at age 2. Mammogram. This may be done every 1-2 years. Talk to your health care provider about how often you should have regular mammograms. Talk with your health care provider about your test results, treatment options, and if necessary, the need for more tests. Vaccines  Your health care provider may recommend certain vaccines, such as: Influenza vaccine. This is recommended every year. Tetanus, diphtheria, and acellular pertussis (Tdap, Td) vaccine. You may need a Td booster every 10 years. Zoster vaccine. You may need this after age 26. Pneumococcal 13-valent conjugate (PCV13) vaccine. One dose is recommended after age 52. Pneumococcal polysaccharide (PPSV23) vaccine. One dose is recommended after age 64. Talk to your  health care provider about which screenings and vaccines you need and how often you need them. This information is not intended to replace advice given to you by your health care provider. Make sure you discuss any questions you have with your health care provider. Document Released: 12/10/2015 Document Revised: 08/02/2016 Document Reviewed: 09/14/2015 Elsevier Interactive Patient Education  2017 [place] Prevention in the Home Falls can cause injuries. They can happen to people of all ages. There are many things you can do to make your home safe and to help prevent falls. What can I do on the outside of my home? Regularly fix the edges of walkways and driveways and fix any cracks. Remove anything that might make you trip as you walk through a door, such as a raised step or threshold. Trim any bushes or trees on the path to your home. Use bright outdoor lighting. Clear any walking paths of anything that might make someone trip, such as rocks or tools. Regularly check to see if handrails are loose or broken.  Make sure that both sides of any steps have handrails. Any raised decks and porches should have guardrails on the edges. Have any leaves, snow, or ice cleared regularly. Use sand or salt on walking paths during winter. Clean up any spills in your garage right away. This includes oil or grease spills. What can I do in the bathroom? Use night lights. Install grab bars by the toilet and in the tub and shower. Do not use towel bars as grab bars. Use non-skid mats or decals in the tub or shower. If you need to sit down in the shower, use a plastic, non-slip stool. Keep the floor dry. Clean up any water that spills on the floor as soon as it happens. Remove soap buildup in the tub or shower regularly. Attach bath mats securely with double-sided non-slip rug tape. Do not have throw rugs and other things on the floor that can make you trip. What can I do in the bedroom? Use night  lights. Make sure that you have a light by your bed that is easy to reach. Do not use any sheets or blankets that are too big for your bed. They should not hang down onto the floor. Have a firm chair that has side arms. You can use this for support while you get dressed. Do not have throw rugs and other things on the floor that can make you trip. What can I do in the kitchen? Clean up any spills right away. Avoid walking on wet floors. Keep items that you use a lot in easy-to-reach places. If you need to reach something above you, use a strong step stool that has a grab bar. Keep electrical cords out of the way. Do not use floor polish or wax that makes floors slippery. If you must use wax, use non-skid floor wax. Do not have throw rugs and other things on the floor that can make you trip. What can I do with my stairs? Do not leave any items on the stairs. Make sure that there are handrails on both sides of the stairs and use them. Fix handrails that are broken or loose. Make sure that handrails are as long as the stairways. Check any carpeting to make sure that it is firmly attached to the stairs. Fix any carpet that is loose or worn. Avoid having throw rugs at the top or bottom of the stairs. If you do have throw rugs, attach them to the floor with carpet tape. Make sure that you have a light switch at the top of the stairs and the bottom of the stairs. If you do not have them, ask someone to add them for you. What else can I do to help prevent falls? Wear shoes that: Do not have high heels. Have rubber bottoms. Are comfortable and fit you well. Are closed at the toe. Do not wear sandals. If you use a stepladder: Make sure that it is fully opened. Do not climb a closed stepladder. Make sure that both sides of the stepladder are locked into place. Ask someone to hold it for you, if possible. Clearly mark and make sure that you can see: Any grab bars or handrails. First and last  steps. Where the edge of each step is. Use tools that help you move around (mobility aids) if they are needed. These include: Canes. Walkers. Scooters. Crutches. Turn on the lights when you go into a dark area. Replace any light bulbs as soon as they burn out. Set up  your furniture so you have a clear path. Avoid moving your furniture around. If any of your floors are uneven, fix them. If there are any pets around you, be aware of where they are. Review your medicines with your doctor. Some medicines can make you feel dizzy. This can increase your chance of falling. Ask your doctor what other things that you can do to help prevent falls. This information is not intended to replace advice given to you by your health care provider. Make sure you discuss any questions you have with your health care provider. Document Released: 09/09/2009 Document Revised: 04/20/2016 Document Reviewed: 12/18/2014 Elsevier Interactive Patient Education  2017 [employer].

## 2022-09-04 NOTE — Progress Notes (Signed)
Subjective:   Laura Rojas is a 76 y.o. female who presents for Medicare Annual (Subsequent) preventive examination.  I connected with  [name] on 09/04/22 by a telephone enabled telemedicine application and verified that I am speaking with the correct person using two identifiers.   I discussed the limitations of evaluation and management by telemedicine. The patient expressed understanding and agreed to proceed.  Patient location: home  Provider location: Tele-health-home     Review of Systems     Cardiac Risk Factors include: advanced age (>81mn, >>74women);diabetes mellitus;family history of premature cardiovascular disease;hypertension     Objective:    Today's Vitals   There is no height or weight on file to calculate BMI.     09/04/2022    8:38 AM 04/03/2022   10:47 AM 09/02/2021    8:18 AM 08/30/2020    8:18 AM 05/04/2020    9:37 AM 04/13/2020    8:57 AM 08/27/2019    8:35 AM  Advanced Directives  Does Patient Have a Medical Advance Directive? Yes No No No No No No  Type of Advance Directive HKendallin Chart? No - copy requested        Would patient like information on creating a medical advance directive?     No - Patient declined Yes (MAU/Ambulatory/Procedural Areas - Information given)     Current Medications (verified) Outpatient Encounter Medications as of 09/04/2022  Medication Sig   albuterol (VENTOLIN HFA) 108 (90 Base) MCG/ACT inhaler TAKE 2 PUFFS BY MOUTH EVERY 6 HOURS AS NEEDED FOR WHEEZE OR SHORTNESS OF BREATH   amLODipine (NORVASC) 2.5 MG tablet Take 1 tablet (2.5 mg total) by mouth daily.   aspirin 81 MG tablet Take 81 mg by mouth daily.   atorvastatin (LIPITOR) 40 MG tablet TAKE 1 TABLET BY MOUTH EVERY DAY** STOP THE SIMVASTATIN   BD PEN NEEDLE NANO U/F 32G X 4 MM MISC SMARTSIG:1 Each SUB-Q 4 Times Daily   certolizumab pegol (CIMZIA) 2 X 200 MG/ML PSKT Inject into the skin.    cholecalciferol (VITAMIN D3) 25 MCG (1000 UT) tablet Take 1,000 Units by mouth daily.   clobetasol cream (TEMOVATE) 0.05 % Spot treat affected areas twice a day until improved. Avoid face, groin, axilla.   clopidogrel (PLAVIX) 75 MG tablet TAKE 1 TABLET BY MOUTH EVERY DAY   Continuous Blood Gluc Sensor (FREESTYLE LIBRE 14 DAY SENSOR) MISC USE 1 KIT EVERY 14 (FOURTEEN) DAYS   folic acid (FOLVITE) 1 MG tablet folic acid 1 mg tablet  TAKE 1 TABLET BY MOUTH EVERY DAY   Gabapentin, Once-Daily, 300 MG TABS Take 300 mg by mouth at bedtime.   hydrochlorothiazide (MICROZIDE) 12.5 MG capsule TAKE 1 CAPSULE BY MOUTH EVERY DAY   hydrOXYzine (ATARAX) 25 MG tablet Take 1 tablet by mouth at bedtime.   Insulin Glargine (BASAGLAR KWIKPEN) 100 UNIT/ML Inject 10 Units into the skin at bedtime.   Insulin Pen Needle (BD PEN NEEDLE NANO U/F) 32G X 4 MM MISC Use 1 each 4 (four) times daily   Iron-Vitamin C 65-125 MG TABS Take 1 tablet by mouth daily.   LORazepam (ATIVAN) 0.5 MG tablet TAKE 1 TAB BY MOUTH TWICE A DAY (USE THE ADDITIONAL 30 TABS ONLY SPARINGLY & ONLY FOR SEVERE ANXIETY)   losartan (COZAAR) 100 MG tablet Take 1 tablet (100 mg total) by mouth daily.   Magnesium 400 MG TABS  Take 1 tablet by mouth daily.   metFORMIN (GLUCOPHAGE) 1000 MG tablet Take 0.5 tablets (500 mg total) by mouth 2 (two) times daily.   mometasone (ELOCON) 0.1 % cream Apply 1 Application topically daily as needed (Rash).   Multiple Vitamin (MULTIVITAMIN) tablet Take 1 tablet by mouth daily.   neomycin-polymyxin b-dexamethasone (MAXITROL) 3.5-10000-0.1 SUSP Place 1 drop into the left eye 3 (three) times daily.   NOVOLOG FLEXPEN 100 UNIT/ML FlexPen INJECT 10 UNITS INTO THE SKIN 3 (THREE) TIMES DAILY. INJECT PER SLIDING SCALE REGIMEN.   Omega-3 Fatty Acids (FISH OIL) 1200 MG CAPS Take 1,200 mg by mouth daily.   pantoprazole (PROTONIX) 40 MG tablet Take 1 tablet (40 mg total) by mouth daily.   sertraline (ZOLOFT) 100 MG tablet TAKE 1.5  TABLETS BY MOUTH DAILY   sitaGLIPtin (JANUVIA) 100 MG tablet Take 1 tablet (100 mg total) by mouth daily.   tiZANidine (ZANAFLEX) 2 MG tablet Take 1 tablet (2 mg total) by mouth every 8 (eight) hours as needed for muscle spasms.   triamcinolone cream (KENALOG) 0.1 % Apply 1 application. topically 2 (two) times daily.   valACYclovir (VALTREX) 1000 MG tablet Take 1,000 mg by mouth daily.   vitamin B-12 (CYANOCOBALAMIN) 1000 MCG tablet Take 1,000 mcg by mouth daily.   No facility-administered encounter medications on file as of 09/04/2022.    Allergies (verified) Compazine [prochlorperazine edisylate], Altace [ramipril], Crestor [rosuvastatin calcium], Reglan [metoclopramide], Sulfa antibiotics, Actos [pioglitazone], Amlodipine, Latex, and Lipitor [atorvastatin]   History: Past Medical History:  Diagnosis Date   Arthritis    Shoulders, hands, feet   CAD (coronary artery disease) 12/31/2013   2007, 3.0 x 28 mm drug-eluting Cypher stent to RCA, 3.5 x 33 mm drug-eluting Cypher stent and proximal LAD) Consent by 2009 Normal perfusion by mildly 2013    Diabetes mellitus without complication (HCC)    Dyslipidemia    GERD (gastroesophageal reflux disease)    Hypertension    Polymyalgia rheumatica syndrome ([place])    Subclavian artery stenosis, left ([place]) 01/01/2015   Past Surgical History:  Procedure Laterality Date   BREAST EXCISIONAL BIOPSY Right 1970   EXCISIONAL - NEG   BREAST EXCISIONAL BIOPSY Left 1973   EXCISIONAL - NEG   BREAST SURGERY     CARDIAC CATHETERIZATION  08/25/2008   patent stents   CAROTID DUPLEX  10/2006   NORMAL CAROTID ARTERY DUPLEX   CATARACT EXTRACTION W/PHACO Left 04/13/2020   Procedure: CATARACT EXTRACTION PHACO AND INTRAOCULAR LENS PLACEMENT (IOC) LEFT DIABETIC 4.69  00:44.0;  Surgeon: Birder Robson, MD;  Location: [place];  Service: Ophthalmology;  Laterality: Left;  Diabetic - insulin and oral meds   CATARACT EXTRACTION W/PHACO Right 05/04/2020    Procedure: CATARACT EXTRACTION PHACO AND INTRAOCULAR LENS PLACEMENT (IOC) RIGHT DIABETIC 3.92  00:27.1;  Surgeon: Birder Robson, MD;  Location: [place];  Service: Ophthalmology;  Laterality: Right;  Diabetic - insulin and oral meds   COLONOSCOPY WITH PROPOFOL N/A 08/19/2019   Procedure: COLONOSCOPY WITH PROPOFOL;  Surgeon: Lucilla Lame, MD;  Location: [facility] ENDOSCOPY;  Service: Endoscopy;  Laterality: N/A;   CORONARY ANGIOPLASTY WITH STENT PLACEMENT  09/17/2006   PTCA & stenting LAD   ESOPHAGOGASTRODUODENOSCOPY N/A 04/27/2015   Procedure: ESOPHAGOGASTRODUODENOSCOPY (EGD);  Surgeon: Lucilla Lame, MD;  Location: [facility] ENDOSCOPY;  Service: Endoscopy;  Laterality: N/A;   JOINT REPLACEMENT     NM MYOCAR MULTIPLE W/SPECT  11/2011   EF 72%.NORMAL MYOCARDIAL PERFUSION STUDY   REPLACEMENT TOTAL KNEE  2003 &  2004   TRANSTHORACIC ECHOCARDIOGRAM  09/2006   EF =>55%. IMPAIRED LV RELAXATION. MILD MITRAL ANNULAR CALCIFICATION. AV-MILDLY SCLEROTIC. NO AS.   VAGINAL HYSTERECTOMY  1983   Family History  Problem Relation Age of Onset   Breast cancer Mother 54   Diabetes Sister    Heart disease Maternal Grandmother    Heart disease Maternal Grandfather    Brain cancer Grandchild    Social History   Socioeconomic History   Marital status: Married    Spouse name: Not on file   Number of children: Not on file   Years of education: Not on file   Highest education level: Some college, no degree  Occupational History   Not on file  Tobacco Use   Smoking status: Never   Smokeless tobacco: Never  Vaping Use   Vaping Use: Never used  Substance and Sexual Activity   Alcohol use: No    Alcohol/week: 0.0 standard drinks of alcohol   Drug use: Never   Sexual activity: Not on file  Other Topics Concern   Not on file  Social History Narrative   Not on file   Social Determinants of Health   Financial Resource Strain: Low Risk  (09/04/2022)   Overall Financial Resource Strain (CARDIA)     Difficulty of Paying Living Expenses: Not hard at all  Food Insecurity: No Food Insecurity (09/04/2022)   Hunger Vital Sign    Worried About Running Out of Food in the Last Year: Never true    [place] in the Last Year: Never true  Transportation Needs: No Transportation Needs (09/04/2022)   PRAPARE - Hydrologist (Medical): No    Lack of Transportation (Non-Medical): No  Physical Activity: Insufficiently Active (09/04/2022)   Exercise Vital Sign    Days of Exercise per Week: 3 days    Minutes of Exercise per Session: 30 min  Stress: No Stress Concern Present (09/04/2022)   [place]    Feeling of Stress : Not at all  Social Connections: [place] (09/04/2022)   Social Connection and Isolation Panel [NHANES]    Frequency of Communication with Friends and Family: More than three times a week    Frequency of Social Gatherings with Friends and Family: Twice a week    Attends Religious Services: More than 4 times per year    Active Member of [employer] or Organizations: Yes    Attends Music therapist: More than 4 times per year    Marital Status: Married    Tobacco Counseling Counseling given: Not Answered   Clinical Intake:  Pre-visit preparation completed: Yes  Pain : No/denies pain     Diabetes: Yes Did pt. bring in CBG monitor from home?: No  How often do you need to have someone help you when you read instructions, pamphlets, or other written materials from your doctor or pharmacy?: 1 - Never  Diabetic?  Yes  Nutrition Risk Assessment:  Has the patient had any N/V/D within the last 2 months?  No  Does the patient have any non-healing wounds?  No  Has the patient had any unintentional weight loss or weight gain?  No   Diabetes:  Is the patient diabetic?  Yes  If diabetic, was a CBG obtained today?  No  Did the patient bring in their glucometer from  home?  No  How often do you monitor your CBG's?  4 times a day.  Financial Strains and Diabetes Management:  Are you having any financial strains with the device, your supplies or your medication? No .  Does the patient want to be seen by Chronic Care Management for management of their diabetes?  No  Would the patient like to be referred to a Nutritionist or for Diabetic Management?  No   Diabetic Exams:  Diabetic Eye Exam:.  Pt has been advised about the importance in completing this exam  Diabetic Foot Exam. Pt has been advised about the importance in completing this exam.   Interpreter Needed?: No  Information entered by :: Leroy Kennedy LPN   Activities of Daily Living    09/04/2022    8:54 AM  In your present state of health, do you have any difficulty performing the following activities:  Hearing? 0  Vision? 0  Difficulty concentrating or making decisions? 0  Walking or climbing stairs? 0  Dressing or bathing? 0  Doing errands, shopping? 0  Preparing Food and eating ? N  Using the Toilet? N  In the past six months, have you accidently leaked urine? N  Do you have problems with loss of bowel control? N  Managing your Medications? N  Managing your Finances? N  Housekeeping or managing your Housekeeping? N    Patient Care Team: Venita Lick, NP as PCP - General (Nurse Practitioner) Sanda Klein, MD as PCP - Cardiology (Cardiology) Marlowe Sax, MD as Referring Physician (Internal Medicine) Warnell Forester, NP (Inactive) as Nurse Practitioner (Surgery) Madelin Rear, [facility] (Inactive) as Pharmacist (Pharmacist)  Indicate any recent Medical Services you may have received from other than [facility] providers in the past year (date may be approximate).     Assessment:   This is a routine wellness examination for Lenn[place].  Hearing/Vision screen Hearing Screening - Comments:: No trouble hearing Vision Screening - Comments:: Up to date [place]  eye  Dietary issues and exercise activities discussed: Current Exercise Habits: Structured exercise class, Type of exercise: treadmill;strength training/weights, Time (Minutes): 30, Frequency (Times/Week): 4, Weekly Exercise (Minutes/Week): 120, Intensity: Mild   Goals Addressed             This Visit's Progress    Increase water intake   On track    Recommend drinking at least 6-8 glasses of water a day      Patient Stated   On track    09/02/2021, no goals     Patient Stated       Lower A1C       Depression Screen    09/04/2022    8:53 AM 05/31/2022    9:08 AM 03/28/2022    1:07 PM 02/21/2022    2:07 PM 11/25/2021    8:30 AM 09/02/2021    8:19 AM 08/02/2021    1:42 PM  PHQ 2/9 Scores  PHQ - 2 Score 0 0 0 1 0 0 0  PHQ- 9 Score 0 5 3 4 2  2     Fall Risk    09/04/2022    8:38 AM 05/31/2022    9:08 AM 03/28/2022    1:07 PM 09/02/2021    8:19 AM 08/30/2020    8:19 AM  [place] in the past year? 1 1 1  0 0  Number falls in past yr: 0 1 0    Injury with Fall? 0 0 0    Risk for fall due to :  History of fall(s) History of fall(s) Medication side effect Medication side effect  Follow up Falls evaluation completed;Education provided;Falls prevention discussed Falls evaluation completed Falls evaluation completed Falls evaluation completed;Education provided;Falls prevention discussed Falls evaluation completed;Education provided;Falls prevention discussed    FALL RISK PREVENTION PERTAINING TO THE HOME:  Any stairs in or around the home? Yes  If so, are there any without handrails? No  Home free of loose throw rugs in walkways, pet beds, electrical cords, etc? Yes  Adequate lighting in your home to reduce risk of falls? Yes   ASSISTIVE DEVICES UTILIZED TO PREVENT FALLS:  Life alert? No  Use of a cane, walker or w/c? No  Grab bars in the bathroom? No  Shower chair or bench in shower? Yes  Elevated toilet seat or a handicapped toilet? Yes   TIMED UP AND GO:  Was the  test performed? No .    Cognitive Function:        09/04/2022    8:37 AM 09/02/2021    8:21 AM 08/30/2020    8:21 AM 12/17/2019   10:47 AM 08/21/2018    9:53 AM  6CIT Screen  What Year? 0 points 0 points 0 points 0 points 0 points  What month? 0 points 0 points 0 points 0 points 0 points  What time? 0 points 0 points 0 points 0 points 0 points  Count back from 20 0 points 0 points 0 points 0 points 0 points  Months in reverse 0 points 0 points 0 points 0 points 0 points  Repeat phrase 0 points 0 points 0 points 0 points 2 points  Total Score 0 points 0 points 0 points 0 points 2 points    Immunizations Immunization History  Administered Date(s) Administered   Fluad Quad(high Dose 65+) 09/14/2021   Influenza, High Dose Seasonal PF 09/04/2016, 08/23/2017, 08/28/2018, 07/14/2019, 09/03/2020   Influenza,inj,Quad PF,6+ Mos 09/27/2015   Influenza-Unspecified 09/18/2014   PFIZER(Purple Top)SARS-COV-2 Vaccination 12/04/2019, 12/25/2019, 09/01/2020   Pneumococcal Conjugate-13 04/22/2014   Pneumococcal Polysaccharide-23 11/12/2008, 07/05/2016   Td 12/21/2009, 03/17/2020   Zoster Recombinat (Shingrix) 07/14/2019, 10/15/2019    TDAP status: Up to date  Flu Vaccine status: Due, Education has been provided regarding the importance of this vaccine. Advised may receive this vaccine at local pharmacy or Health Dept. Aware to provide a copy of the vaccination record if obtained from local pharmacy or Health Dept. Verbalized acceptance and understanding.  Pneumococcal vaccine status: Up to date  Covid-19 vaccine status: Information provided on how to obtain vaccines.   Qualifies for Shingles Vaccine? No   Zostavax completed No   Shingrix Completed?: Yes  Screening Tests Health Maintenance  Topic Date Due   INFLUENZA VACCINE  06/27/2022   COVID-19 Vaccine (4 - Pfizer risk series) 09/20/2022 (Originally 10/27/2020)   OPHTHALMOLOGY EXAM  11/30/2022   HEMOGLOBIN A1C  12/01/2022   Diabetic  kidney evaluation - Urine ACR  02/22/2023   FOOT EXAM  02/22/2023   Diabetic kidney evaluation - GFR measurement  07/19/2023   DEXA SCAN  05/13/2024   COLONOSCOPY (Pts 45-64yr Insurance coverage will need to be confirmed)  08/18/2024   TETANUS/TDAP  03/17/2030   Pneumonia Vaccine 76 Years old  Completed   Hepatitis C Screening  Completed   Zoster Vaccines- Shingrix  Completed   HPV VACCINES  Aged Out    Health Maintenance  Health Maintenance Due  Topic Date Due   INFLUENZA VACCINE  06/27/2022    Colorectal cancer screening: No longer required.   Mammogram status: Ordered  . Pt provided with contact info and  advised to call to schedule appt.   Bone Density status: Completed 2015. Results reflect: Bone density results: NORMAL. Repeat every 10 years.  Lung Cancer Screening: (Low Dose CT Chest recommended if Age 29-80 years, 30 pack-year currently smoking OR have quit w/in 15years.) does not qualify.   Lung Cancer Screening Referral:   Additional Screening:  Hepatitis C Screening: does not qualify; Completed 2017  Vision Screening: Recommended annual ophthalmology exams for early detection of glaucoma and other disorders of the eye. Is the patient up to date with their annual eye exam?  Yes  Who is the provider or what is the name of the office in which the patient attends annual eye exams? [facility] If pt is not established with a provider, would they like to be referred to a provider to establish care? No .   Dental Screening: Recommended annual dental exams for proper oral hygiene  Community Resource Referral / Chronic Care Management: CRR required this visit?  No   CCM required this visit?  No      Plan:     I have personally reviewed and noted the following in the patient's chart:   Medical and social history Use of alcohol, tobacco or illicit drugs  Current medications and supplements including opioid prescriptions. Patient is not currently taking opioid  prescriptions. Functional ability and status Nutritional status Physical activity Advanced directives List of other physicians Hospitalizations, surgeries, and ER visits in previous 12 months Vitals Screenings to include cognitive, depression, and falls Referrals and appointments  In addition, I have reviewed and discussed with patient certain preventive protocols, quality metrics, and best practice recommendations. A written personalized care plan for preventive services as well as general preventive health recommendations were provided to patient.     Leroy Kennedy, LPN   [id number]   Nurse Notes:

## 2022-09-05 ENCOUNTER — Ambulatory Visit: Payer: Medicare Other | Admitting: Nurse Practitioner

## 2022-09-06 ENCOUNTER — Ambulatory Visit: Payer: Medicare Other | Admitting: Nurse Practitioner

## 2022-09-07 ENCOUNTER — Encounter: Payer: Self-pay | Admitting: Nurse Practitioner

## 2022-09-07 ENCOUNTER — Ambulatory Visit (INDEPENDENT_AMBULATORY_CARE_PROVIDER_SITE_OTHER): Payer: Medicare Other | Admitting: Nurse Practitioner

## 2022-09-07 VITALS — BP 108/66 | HR 64 | Temp 97.8°F | Ht 65.0 in | Wt 158.4 lb

## 2022-09-07 DIAGNOSIS — F413 Other mixed anxiety disorders: Secondary | ICD-10-CM | POA: Diagnosis not present

## 2022-09-07 DIAGNOSIS — I25708 Atherosclerosis of coronary artery bypass graft(s), unspecified, with other forms of angina pectoris: Secondary | ICD-10-CM

## 2022-09-07 DIAGNOSIS — N183 Chronic kidney disease, stage 3 unspecified: Secondary | ICD-10-CM | POA: Diagnosis not present

## 2022-09-07 DIAGNOSIS — Z794 Long term (current) use of insulin: Secondary | ICD-10-CM

## 2022-09-07 DIAGNOSIS — E1165 Type 2 diabetes mellitus with hyperglycemia: Secondary | ICD-10-CM

## 2022-09-07 DIAGNOSIS — E785 Hyperlipidemia, unspecified: Secondary | ICD-10-CM | POA: Diagnosis not present

## 2022-09-07 DIAGNOSIS — Z79899 Other long term (current) drug therapy: Secondary | ICD-10-CM

## 2022-09-07 DIAGNOSIS — I771 Stricture of artery: Secondary | ICD-10-CM

## 2022-09-07 DIAGNOSIS — K219 Gastro-esophageal reflux disease without esophagitis: Secondary | ICD-10-CM

## 2022-09-07 DIAGNOSIS — E1122 Type 2 diabetes mellitus with diabetic chronic kidney disease: Secondary | ICD-10-CM | POA: Diagnosis not present

## 2022-09-07 DIAGNOSIS — G2581 Restless legs syndrome: Secondary | ICD-10-CM | POA: Diagnosis not present

## 2022-09-07 DIAGNOSIS — F5101 Primary insomnia: Secondary | ICD-10-CM

## 2022-09-07 DIAGNOSIS — E1159 Type 2 diabetes mellitus with other circulatory complications: Secondary | ICD-10-CM

## 2022-09-07 DIAGNOSIS — Z23 Encounter for immunization: Secondary | ICD-10-CM

## 2022-09-07 DIAGNOSIS — M353 Polymyalgia rheumatica: Secondary | ICD-10-CM

## 2022-09-07 DIAGNOSIS — F339 Major depressive disorder, recurrent, unspecified: Secondary | ICD-10-CM | POA: Diagnosis not present

## 2022-09-07 DIAGNOSIS — E1169 Type 2 diabetes mellitus with other specified complication: Secondary | ICD-10-CM

## 2022-09-07 DIAGNOSIS — I152 Hypertension secondary to endocrine disorders: Secondary | ICD-10-CM | POA: Diagnosis not present

## 2022-09-07 DIAGNOSIS — R748 Abnormal levels of other serum enzymes: Secondary | ICD-10-CM

## 2022-09-07 DIAGNOSIS — R1012 Left upper quadrant pain: Secondary | ICD-10-CM

## 2022-09-07 LAB — BAYER DCA HB A1C WAIVED: HB A1C (BAYER DCA - WAIVED): 7.7 % — ABNORMAL HIGH (ref 4.8–5.6)

## 2022-09-07 NOTE — Assessment & Plan Note (Signed)
Chronic, ongoing, followed by endocrinology in past with A1c 7.7% today, mild trend up but remaining well below her previous levels in 9-10 range.  She wishes to avoid return to endo at this time as her previous provider left.  Will continue Januvia 100 MG daily which is offering benefit (renal dose as needed) + recommend she continue Basaglar but increase slightly to 13 units + cut back on her sliding scale with this, continue Metformin.  Continue collaboration with CCM team in office.   Bring Libre to next visit.  Return to office in 3 months -- goal to avoid hypoglycemia.

## 2022-09-07 NOTE — Progress Notes (Signed)
BP 108/66   Pulse 64   Temp 97.8 F (36.6 C) (Oral)   Ht _0  (1.651 m)   Wt 158 lb 6.4 oz (71.8 kg)   SpO2 96%   BMI 26.36 kg/m    Subjective:    Patient ID: Laura Rojas, female    DOB: [birth year], 76 y.o.   MRN: [id number]  HPI: [name] is a 76 y.o. female  Chief Complaint  Patient presents with   Hyperlipidemia   Hypertension   Diabetes   Anxiety   Depression   Chronic Kidney Disease   Flu Vaccine    Patient says she will have her Flu vaccine at another visit, as she is going to a wedding this weekend and do not want to risk getting sick.   DIABETES Last visit A1c 7.6% July. She continues on Basaglar, Novolog sliding scale, Januvia, and Metformin.  Followed by endocrinology in past, last saw NP [facility] 05/16/21, has not returned as NP no longer at the practice and she prefers to stay with PCP at this time.  Does endorse poor diet past months due to birthday celebrations.   She did not tolerate GLP1 (Trulicity) in past and had side effects with [place].  Follows with podiatry and they are considering surgery to right foot.   Hypoglycemic episodes: a few in low 60's Polydipsia/polyuria: no Visual disturbance: no Chest pain: no Paresthesias: no Glucose Monitoring: yes             Accucheck frequency: Freestyle in place -- range from 60 to 280 range             Fasting glucose:              Post prandial:             Evening:              Before meals: Taking Insulin?: yes             Long acting insulin: 10 units often at night             Short acting insulin: sliding scale units with meals (20 units) Blood Pressure Monitoring: rarely Retinal Examination: Up to Date Foot Exam: Up to Date Pneumovax: Up to Date Influenza: Up to Date Aspirin: yes   HYPERTENSION / [facility] Sees cardiology, Dr. Sallyanne Kuster, last saw 10/19/21, sees yearly. She is followed due to subclavian artery stenosis and angina. Continues on Amlodipine, Plavix, HCTZ, Fish  oil, Losartan, Lipitor.  Satisfied with current treatment? yes Duration of hypertension: chronic BP monitoring frequency: not checking BP range:not checking BP medication side effects: no Duration of hyperlipidemia: chronic Cholesterol medication side effects: no Cholesterol supplements: fish oil Medication compliance: good compliance Aspirin: yes Recent stressors: no Recurrent headaches: no Visual changes: no Palpitations: no Dyspnea: no Chest pain: no Lower extremity edema: no Dizzy/lightheaded: no   CHRONIC KIDNEY DISEASE Remaining stable on recent labs. CKD status: stable Medications renally dose: yes Previous renal evaluation: no Pneumovax:  Up to Date Influenza Vaccine:  Up to Date    POLYMYALGIA RHEUMATICA & RLS Follows with rheumatology, diagnosis of polymyalgia rheumatica syndrome. Saw last 01/03/22, getting injections at this time -- these help with pain.  At one time got infusions but this caused reaction. Continues on Gabapentin for RLS at bedtime only. Duration: chronic Involved joints: right hand, both feet, and left knee Mechanism of injury: arthritis Severity: 1/10 at worst -- to left foot and occasionally to hands Quality:  dull, aching and burning Frequency: intermittent  Radiation: no Aggravating factors: walking and movement  Alleviating factors: Aspercreme and APAP  Status: stable Treatments attempted: Tylenol and Aspercreme Relief with NSAIDs?:  No NSAIDs Taken Weakness with weight bearing or walking: no Sensation of giving way: no Locking: no Popping: no Bruising: no Swelling: no Redness: no Paresthesias/decreased sensation: no Fevers: no  GERD GERD control status: stable Satisfied with current treatment? yes Heartburn frequency:  Medication side effects: no  Dysphagia: no Odynophagia:  no Hematemesis: no Blood in stool: no EGD: yes    ANXIETY/STRESS Takes Zoloft 150 MG daily.  Continues Ativan 0.5 MG, takes this at night only,  although ordered BID -- currently reports she takes 1 MG at night. Occasionally will take extra dose during day time, but not often.  Pt aware of risks of benzo medication use to include increased sedation, respiratory suppression, falls, dependence and cardiovascular events.  Pt would like to continue treatment as benefit determined to outweigh risk.  On review PDMP last Ativan fill 08/28/22 for 3 months supply, previous PCP ordered this way. Duration:stable Anxious mood: no Excessive worrying: no Irritability: no  Sweating: no Nausea: no Palpitations:no Hyperventilation: no Panic attacks: no Agoraphobia: no  Obscessions/compulsions: no Depressed mood: no    09/07/2022    9:24 AM 09/04/2022    8:53 AM 05/31/2022    9:08 AM 03/28/2022    1:07 PM 02/21/2022    2:07 PM  Depression screen PHQ 2/9  Decreased Interest 0 0 0 0 0  Down, Depressed, Hopeless 0 0 0 0 1  PHQ - 2 Score 0 0 0 0 1  Altered sleeping 1 0 2 1 0  Tired, decreased energy 0 0 _0 Change in appetite 0 0 _1 Feeling bad or failure about yourself  0 0 0 0 0  Trouble concentrating 0 0 0 0 0  Moving slowly or fidgety/restless 0 0 0 0 0  Suicidal thoughts 0 0 0 0 0  PHQ-9 Score 1 0 _2 Difficult doing work/chores Not difficult at all Not difficult at all Not difficult at all        09/07/2022    9:24 AM 05/31/2022    9:08 AM 03/28/2022    1:08 PM 02/21/2022    2:08 PM  GAD 7 : Generalized Anxiety Score  Nervous, Anxious, on Edge 0 0 0 0  Control/stop worrying 0 0 0 0  Worry too much - different things 0 0 0 0  Trouble relaxing 0 0 0 0  Restless 0 0 0 0  Easily annoyed or irritable 0 0 0 1  Afraid - awful might happen 0 0 0 0  Total GAD 7 Score 0 0 0 1  Anxiety Difficulty Not difficult at all Not difficult at all  Not difficult at all      Relevant past medical, surgical, family and social history reviewed and updated as indicated. Interim medical history since our last visit reviewed. Allergies and  medications reviewed and updated.  Review of Systems  Per HPI unless specifically indicated above     Objective:    BP 108/66   Pulse 64   Temp 97.8 F (36.6 C) (Oral)   Ht _3  (1.651 m)   Wt 158 lb 6.4 oz (71.8 kg)   SpO2 96%   BMI 26.36 kg/m   Wt Readings from Last 3 Encounters:  09/07/22 158 lb 6.4 oz (71.8 kg)  08/18/22  156 lb 11.2 oz (71.1 kg)  07/18/22 158 lb (71.7 kg)    Physical Exam Vitals and nursing note reviewed.  Constitutional:      General: She is awake. She is not in acute distress.    Appearance: She is well-developed, well-groomed and overweight. She is not ill-appearing.  HENT:     Head: Normocephalic.     Right Ear: Hearing normal.     Left Ear: Hearing normal.  Eyes:     General: Lids are normal.        Right eye: No discharge.        Left eye: No discharge.     Conjunctiva/sclera: Conjunctivae normal.     Pupils: Pupils are equal, round, and reactive to light.  Neck:     Thyroid: No thyromegaly.     Vascular: Carotid bruit (bilateral) present.  Cardiovascular:     Rate and Rhythm: Normal rate and regular rhythm.     Heart sounds: Normal heart sounds. No murmur heard.    No gallop.  Pulmonary:     Effort: Pulmonary effort is normal. No accessory muscle usage or respiratory distress.     Breath sounds: No wheezing or rhonchi.  Abdominal:     General: Bowel sounds are normal.     Palpations: Abdomen is soft.  Musculoskeletal:     Cervical back: Normal range of motion and neck supple.     Right lower leg: No edema.     Left lower leg: No edema.  Skin:    General: Skin is warm and dry.  Neurological:     Mental Status: She is alert and oriented to person, place, and time.  Psychiatric:        Attention and Perception: Attention normal.        Mood and Affect: Mood normal.        Speech: Speech normal.        Behavior: Behavior normal. Behavior is cooperative.        Thought Content: Thought content normal.    Results for orders  placed or performed in visit on 07/18/22  Urinalysis, Routine w reflex microscopic  Result Value Ref Range   Specific Gravity, UA 1.020 1.005 - 1.030   pH, UA 6.5 5.0 - 7.5   Color, UA Yellow Yellow   Appearance Ur Clear Clear   Leukocytes,UA Negative Negative   Protein,UA Negative Negative/Trace   Glucose, UA Negative Negative   Ketones, UA Negative Negative   RBC, UA Negative Negative   Bilirubin, UA Negative Negative   Urobilinogen, Ur 1.0 0.2 - 1.0 mg/dL   Nitrite, UA Negative Negative  Comprehensive metabolic panel  Result Value Ref Range   Glucose 187 (H) 70 - 99 mg/dL   BUN 19 8 - 27 mg/dL   Creatinine, Ser 1.02 (H) 0.57 - 1.00 mg/dL   eGFR 57 (L) >59 mL/min/1.73   BUN/Creatinine Ratio 19 12 - 28   Sodium 141 134 - 144 mmol/L   Potassium 4.4 3.5 - 5.2 mmol/L   Chloride 103 96 - 106 mmol/L   CO2 22 20 - 29 mmol/L   Calcium 9.8 8.7 - 10.3 mg/dL   Total Protein 6.8 6.0 - 8.5 g/dL   Albumin 4.6 3.8 - 4.8 g/dL   Globulin, Total 2.2 1.5 - 4.5 g/dL   Albumin/Globulin Ratio 2.1 1.2 - 2.2   Bilirubin Total 0.5 0.0 - 1.2 mg/dL   Alkaline Phosphatase 150 (H) 44 - 121 IU/L   AST 22 0 -  40 IU/L   ALT 23 0 - 32 IU/L  Amylase  Result Value Ref Range   Amylase 157 (H) 31 - 110 U/L  Lipase  Result Value Ref Range   Lipase 420 (H) 14 - 85 U/L      Assessment & Plan:   Problem List Items Addressed This Visit       Cardiovascular and Mediastinum   Coronary artery disease of bypass graft of native heart with stable angina pectoris ([place])    Continue collaboration with cardiology and preventative treatment, including statin and ASA.      Relevant Orders   Comprehensive metabolic panel   Hypertension associated with diabetes (HCC)    Chronic, stable.  BP well below goal today.  Continue current medication regimen and collaboration with cardiology. Labs: CMP.  Recommend she monitor BP at least three days a week at home and document for providers + bring to visits.  DASH diet  focus.  Return in 3 months.      Relevant Orders   Bayer DCA Hb A1c Waived   Comprehensive metabolic panel   Subclavian artery stenosis, left (HCC)    Stable, continue collaboration with cardiology and preventative treatment to include ASA, Plavix, and statin.      Relevant Orders   Comprehensive metabolic panel     Digestive   Gastroesophageal reflux disease without esophagitis    Chronic, ongoing.  Continue Protonix and adjust as needed.  Mag level annually.  Risks of PPI use were discussed with patient including bone loss, C. Diff diarrhea, pneumonia, infections, CKD, electrolyte abnormalities.  Pt. Verbalizes understanding and chooses to continue the medication.      Relevant Orders   Magnesium     Endocrine   CKD stage 3 due to type 2 diabetes mellitus (HCC)    Chronic, stable Stage 3a.  Continue Losartan for kidney protection.  Urine ALB March 2023 =  ALB 10 and A:C <30. Continue collaboration with cardiology.  Consider nephrology referral if worsening function.  Labs: CMP.      Relevant Orders   Bayer DCA Hb A1c Waived   Comprehensive metabolic panel   Hyperlipidemia associated with type 2 diabetes mellitus (HCC)    Chronic, ongoing.  Continue current medication regimen and adjust as needed.  Lipid panel today.  Discussed with patient, if myalgias with Atorvastatin, + her history of myalgia with statins, could consider Repatha initiation if poor tolerance.      Relevant Orders   Bayer DCA Hb A1c Waived   Comprehensive metabolic panel   Lipid Panel w/o Chol/HDL Ratio   Type 2 diabetes mellitus with hyperglycemia, with long-term current use of insulin (HCC) - Primary    Chronic, ongoing, followed by endocrinology in past with A1c 7.7% today, mild trend up but remaining well below her previous levels in 9-10 range.  She wishes to avoid return to endo at this time as her previous provider left.  Will continue Januvia 100 MG daily which is offering benefit (renal dose as  needed) + recommend she continue Basaglar but increase slightly to 13 units + cut back on her sliding scale with this, continue Metformin.  Continue collaboration with CCM team in office.   Bring Libre to next visit.  Return to office in 3 months -- goal to avoid hypoglycemia.        Relevant Orders   Bayer DCA Hb A1c Waived     Other   Anxiety disorder    Chronic, ongoing.  Denies  SI/HI.  Continue Sertraline and Ativan.  Has had multiple attempts at reduction of benzo with no success.  Discussed at length risks with her and she is aware.  Continue Ativan and fill upon request.  PDMP reviewed.  Return in 3 months, UDS up to date (05/31/22) and contract on file.      Depression, recurrent (HCC)    Chronic, ongoing.  Denies SI/HI.  Continue Sertraline 150 MG daily as is ordered. This is offering benefit to mood overall and may help reduce benzo use.  Consider increase to 200 MG in future for increased benefit.  Return in 3 months.      Insomnia    Chronic, ongoing with use of Lorazepam nightly.  Has had trial reductions in past and other medications without success.  Is fully aware of risks of chronic benzo use and aware this will be discussed each visit.  Return in 3 months.  UDS up to date (05/31/22), contract up to date.      Long-term current use of benzodiazepine    Chronic, ongoing for insomnia.  Has had trials of other medications and reductions with poor outcomes.  Fully aware of risks of chronic benzo use and aware this will be discussed each visit.  UDS up to date and contract on file.      LUQ abdominal pain    Acute and improving with new mattress.  Suspect more musculoskeletal. Tizanidine to take only as needed last visit, low dose 2 MG.  Continue Tylenol at night.  Labs noting elevation in Amylase and Lipase, however CT normal pancreas -- gallstones noted.  Recheck labs today and send to general surgery as needed.      Relevant Orders   Amylase   Lipase   Polymyalgia rheumatica  (HCC)    Chronic, ongoing.  Continue collaboration with rheumatology and current medication regimen.  Recent notes reviewed.      Relevant Orders   Comprehensive metabolic panel   Restless leg syndrome    Chronic, stable with Gabapentin, continue this regimen and adjust as needed. Renal dose as needed.        Follow up plan: Return in about 3 months (around 12/08/2022) for T2DM, HTN/HLD, CKD, MOOD, RA.

## 2022-09-07 NOTE — Assessment & Plan Note (Signed)
Chronic, ongoing.  Denies SI/HI.  Continue Sertraline 150 MG daily as is ordered. This is offering benefit to mood overall and may help reduce benzo use.  Consider increase to 200 MG in future for increased benefit.  Return in 3 months. 

## 2022-09-07 NOTE — Assessment & Plan Note (Signed)
Stable, continue collaboration with cardiology and preventative treatment to include ASA, Plavix, and statin. 

## 2022-09-07 NOTE — Assessment & Plan Note (Signed)
Chronic, ongoing.  Continue collaboration with rheumatology and current medication regimen.  Recent notes reviewed. 

## 2022-09-07 NOTE — Assessment & Plan Note (Signed)
Chronic, ongoing.  Continue current medication regimen and adjust as needed.  Lipid panel today.  Discussed with patient, if myalgias with Atorvastatin, + her history of myalgia with statins, could consider Repatha initiation if poor tolerance. 

## 2022-09-07 NOTE — Assessment & Plan Note (Signed)
Chronic, ongoing.  Denies SI/HI.  Continue Sertraline and Ativan.  Has had multiple attempts at reduction of benzo with no success.  Discussed at length risks with her and she is aware.  Continue Ativan and fill upon request.  PDMP reviewed.  Return in 3 months, UDS up to date (05/31/22) and contract on file. 

## 2022-09-07 NOTE — Assessment & Plan Note (Signed)
Chronic, stable Stage 3a.  Continue Losartan for kidney protection.  Urine ALB March 2023 =  ALB 10 and A:C <30. Continue collaboration with cardiology.  Consider nephrology referral if worsening function.  Labs: CMP.

## 2022-09-07 NOTE — Assessment & Plan Note (Signed)
Chronic, stable.  BP well below goal today.  Continue current medication regimen and collaboration with cardiology. Labs: CMP.  Recommend she monitor BP at least three days a week at home and document for providers + bring to visits.  DASH diet focus.  Return in 3 months.

## 2022-09-07 NOTE — Assessment & Plan Note (Signed)
Acute and improving with new mattress.  Suspect more musculoskeletal. Tizanidine to take only as needed last visit, low dose 2 MG.  Continue Tylenol at night.  Labs noting elevation in Amylase and Lipase, however CT normal pancreas -- gallstones noted.  Recheck labs today and send to general surgery as needed.

## 2022-09-07 NOTE — Assessment & Plan Note (Signed)
Chronic, ongoing with use of Lorazepam nightly.  Has had trial reductions in past and other medications without success.  Is fully aware of risks of chronic benzo use and aware this will be discussed each visit.  Return in 3 months.  UDS up to date (05/31/22), contract up to date. 

## 2022-09-07 NOTE — Assessment & Plan Note (Signed)
Chronic, ongoing for insomnia.  Has had trials of other medications and reductions with poor outcomes.  Fully aware of risks of chronic benzo use and aware this will be discussed each visit.  UDS up to date and contract on file. 

## 2022-09-07 NOTE — Assessment & Plan Note (Signed)
Chronic, stable with Gabapentin, continue this regimen and adjust as needed. Renal dose as needed. 

## 2022-09-07 NOTE — Assessment & Plan Note (Signed)
Chronic, ongoing.  Continue Protonix and adjust as needed.  Mag level annually.  Risks of PPI use were discussed with patient including bone loss, C. Diff diarrhea, pneumonia, infections, CKD, electrolyte abnormalities.  Pt. Verbalizes understanding and chooses to continue the medication.

## 2022-09-07 NOTE — Assessment & Plan Note (Signed)
Continue collaboration with cardiology and preventative treatment, including statin and ASA. 

## 2022-09-08 LAB — MAGNESIUM: Magnesium: 1.8 mg/dL (ref 1.6–2.3)

## 2022-09-08 LAB — COMPREHENSIVE METABOLIC PANEL
ALT: 23 IU/L (ref 0–32)
AST: 20 IU/L (ref 0–40)
Albumin/Globulin Ratio: 2.3 — ABNORMAL HIGH (ref 1.2–2.2)
Albumin: 4.3 g/dL (ref 3.8–4.8)
Alkaline Phosphatase: 151 IU/L — ABNORMAL HIGH (ref 44–121)
BUN/Creatinine Ratio: 16 (ref 12–28)
BUN: 17 mg/dL (ref 8–27)
Bilirubin Total: 0.3 mg/dL (ref 0.0–1.2)
CO2: 24 mmol/L (ref 20–29)
Calcium: 9.6 mg/dL (ref 8.7–10.3)
Chloride: 103 mmol/L (ref 96–106)
Creatinine, Ser: 1.05 mg/dL — ABNORMAL HIGH (ref 0.57–1.00)
Globulin, Total: 1.9 g/dL (ref 1.5–4.5)
Glucose: 208 mg/dL — ABNORMAL HIGH (ref 70–99)
Potassium: 4 mmol/L (ref 3.5–5.2)
Sodium: 141 mmol/L (ref 134–144)
Total Protein: 6.2 g/dL (ref 6.0–8.5)
eGFR: 55 mL/min/{1.73_m2} — ABNORMAL LOW (ref >59)

## 2022-09-08 LAB — AMYLASE: Amylase: 169 U/L — ABNORMAL HIGH (ref 31–110)

## 2022-09-08 LAB — LIPID PANEL W/O CHOL/HDL RATIO
Cholesterol, Total: 160 mg/dL (ref 100–199)
HDL: 66 mg/dL (ref >39)
LDL Chol Calc (NIH): 67 mg/dL (ref 0–99)
Triglycerides: 165 mg/dL — ABNORMAL HIGH (ref 0–149)
VLDL Cholesterol Cal: 27 mg/dL (ref 5–40)

## 2022-09-08 LAB — LIPASE: Lipase: 385 U/L — ABNORMAL HIGH (ref 14–85)

## 2022-09-08 NOTE — Progress Notes (Signed)
Contacted via [place] [facility] [place], your labs have returned: - Kidney function continues to show baseline kidney disease stage 3a, no worsening and remains stable. - Alkaline phosphatase is a little elevated still and pancreas labs, lipase and amylase, have trended up -- I recommend we place referral to GI due to your discomfort in past and these elevations. I will place this referral today and review medications to see if changes needed. - Cholesterol labs remain stable, continue statin.  Any questions? Keep being amazing!!  Thank you for allowing me to participate in your care.  I appreciate you. Kindest regards, Milledge Gerding

## 2022-09-09 DIAGNOSIS — R748 Abnormal levels of other serum enzymes: Secondary | ICD-10-CM | POA: Insufficient documentation

## 2022-09-09 NOTE — Addendum Note (Signed)
Addended by: [name] T on: 09/09/2022 03:17 PM   Modules accepted: Orders

## 2022-09-13 ENCOUNTER — Ambulatory Visit (INDEPENDENT_AMBULATORY_CARE_PROVIDER_SITE_OTHER): Payer: Medicare Other

## 2022-09-13 DIAGNOSIS — Z23 Encounter for immunization: Secondary | ICD-10-CM

## 2022-09-28 DIAGNOSIS — M0609 Rheumatoid arthritis without rheumatoid factor, multiple sites: Secondary | ICD-10-CM | POA: Diagnosis not present

## 2022-10-03 ENCOUNTER — Other Ambulatory Visit: Payer: Self-pay | Admitting: Nurse Practitioner

## 2022-10-03 DIAGNOSIS — Z1231 Encounter for screening mammogram for malignant neoplasm of breast: Secondary | ICD-10-CM

## 2022-10-09 ENCOUNTER — Ambulatory Visit (INDEPENDENT_AMBULATORY_CARE_PROVIDER_SITE_OTHER): Payer: Medicare Other | Admitting: Nurse Practitioner

## 2022-10-09 ENCOUNTER — Encounter: Payer: Self-pay | Admitting: Nurse Practitioner

## 2022-10-09 VITALS — BP 127/73 | HR 65 | Temp 98.2°F | Ht 65.0 in | Wt 157.5 lb

## 2022-10-09 DIAGNOSIS — R1012 Left upper quadrant pain: Secondary | ICD-10-CM | POA: Diagnosis not present

## 2022-10-09 LAB — MICROSCOPIC EXAMINATION: Bacteria, UA: NONE SEEN

## 2022-10-09 LAB — URINALYSIS, ROUTINE W REFLEX MICROSCOPIC
Bilirubin, UA: NEGATIVE
Ketones, UA: NEGATIVE
Nitrite, UA: NEGATIVE
Protein,UA: NEGATIVE
RBC, UA: NEGATIVE
Specific Gravity, UA: 1.015 (ref 1.005–1.030)
Urobilinogen, Ur: 1 mg/dL (ref 0.2–1.0)
pH, UA: 6 (ref 5.0–7.5)

## 2022-10-09 MED ORDER — HYDROCODONE-ACETAMINOPHEN 5-325 MG PO TABS: 1.0000 | ORAL_TABLET | Freq: Four times a day (QID) | ORAL | 0 refills | Status: AC | PRN

## 2022-10-09 NOTE — Progress Notes (Signed)
BP 127/73   Pulse 65   Temp 98.2 F (36.8 C) (Oral)   Ht _0  (1.651 m)   Wt 157 lb 8 oz (71.4 kg)   SpO2 96%   BMI 26.21 kg/m    Subjective:    Patient ID: Laura Rojas, female    DOB: 11-05-1946, 76 y.o.   MRN: [id number]  HPI: Laura Rojas is a 76 y.o. female  Chief Complaint  Patient presents with   Back Pain    Back pain was excruciating at night   Flank Pain    Left  side, started in beginning of summer   ABDOMINAL PAIN  Has had ongoing LUQ pain since August with some flank and back pain.  Currently pain is becoming worse to LUQ and radiates towards back.  Occasionally notices to RUQ, but never as bad as left side.  Got a new mattress, but this did not help 100%.  Amylase and Lipase have been elevated on recent labs.  CT abdomen and pelvis on 07/28/22 noted no acute findings, but there were gallstones.  She is on Januvia for her T2DM.  She notices pain most in morning and then when in bed.  Is scheduled to see GI, but not until February 2023.  Does have RA followed by rheumatology, last visit 06/07/22.  Only takes Tylenol at home when needed.  Taking Cimzia injections for RA, these have been offering benefit. Duration:weeks Onset: sudden Severity: 10/10  Quality: sharp, dull, aching, and throbbing Location:  LUQ  Episode duration: 20 minutes or more Radiation: no Frequency: intermittent Alleviating factors: nothing, it goes away on own Aggravating factors: getting up in morning or at night Status: worsening Treatments attempted: Tizanidine Fever: no Nausea: no Vomiting: no Weight loss: no Decreased appetite: no Diarrhea: no Constipation: no Blood in stool: no Heartburn: no Jaundice: no Rash: no Dysuria/urinary frequency: no Hematuria: no History of sexually transmitted disease: no Recurrent NSAID use: no   Relevant past medical, surgical, family and social history reviewed and updated as indicated. Interim medical history since our last visit  reviewed. Allergies and medications reviewed and updated.  Review of Systems  Constitutional:  Negative for activity change, appetite change, diaphoresis, fatigue and fever.  Respiratory:  Negative for cough, chest tightness and shortness of breath.   Cardiovascular:  Negative for chest pain, palpitations and leg swelling.  Gastrointestinal:  Positive for abdominal pain. Negative for abdominal distention, blood in stool, constipation, diarrhea, nausea and vomiting.  Endocrine: Negative for polydipsia, polyphagia and polyuria.  Neurological: Negative.   Psychiatric/Behavioral: Negative.      Per HPI unless specifically indicated above     Objective:    BP 127/73   Pulse 65   Temp 98.2 F (36.8 C) (Oral)   Ht _1  (1.651 m)   Wt 157 lb 8 oz (71.4 kg)   SpO2 96%   BMI 26.21 kg/m   Wt Readings from Last 3 Encounters:  10/09/22 157 lb 8 oz (71.4 kg)  09/07/22 158 lb 6.4 oz (71.8 kg)  08/18/22 156 lb 11.2 oz (71.1 kg)    Physical Exam Vitals and nursing note reviewed.  Constitutional:      General: She is awake. She is not in acute distress.    Appearance: She is well-developed, well-groomed and overweight. She is not ill-appearing.  HENT:     Head: Normocephalic.     Right Ear: Hearing normal.     Left Ear: Hearing normal.  Eyes:  General: Lids are normal.        Right eye: No discharge.        Left eye: No discharge.     Conjunctiva/sclera: Conjunctivae normal.     Pupils: Pupils are equal, round, and reactive to light.  Neck:     Thyroid: No thyromegaly.     Vascular: Carotid bruit (bilateral) present.  Cardiovascular:     Rate and Rhythm: Normal rate and regular rhythm.     Heart sounds: Normal heart sounds. No murmur heard.    No gallop.  Pulmonary:     Effort: Pulmonary effort is normal. No accessory muscle usage or respiratory distress.     Breath sounds: No wheezing or rhonchi.  Abdominal:     General: Bowel sounds are normal. There is no distension.      Palpations: Abdomen is soft.     Tenderness: There is abdominal tenderness in the left upper quadrant and left lower quadrant. There is no right CVA tenderness, left CVA tenderness, guarding or rebound.     Hernia: No hernia is present.  Musculoskeletal:     Cervical back: Normal range of motion and neck supple.     Right lower leg: No edema.     Left lower leg: No edema.  Skin:    General: Skin is warm and dry.  Neurological:     Mental Status: She is alert and oriented to person, place, and time.  Psychiatric:        Attention and Perception: Attention normal.        Mood and Affect: Mood normal.        Speech: Speech normal.        Behavior: Behavior normal. Behavior is cooperative.        Thought Content: Thought content normal.    Results for orders placed or performed in visit on 09/07/22  Bayer DCA Hb A1c Waived  Result Value Ref Range   HB A1C (BAYER DCA - WAIVED) 7.7 (H) 4.8 - 5.6 %  Comprehensive metabolic panel  Result Value Ref Range   Glucose 208 (H) 70 - 99 mg/dL   BUN 17 8 - 27 mg/dL   Creatinine, Ser 1.05 (H) 0.57 - 1.00 mg/dL   eGFR 55 (L) >59 mL/min/1.73   BUN/Creatinine Ratio 16 12 - 28   Sodium 141 134 - 144 mmol/L   Potassium 4.0 3.5 - 5.2 mmol/L   Chloride 103 96 - 106 mmol/L   CO2 24 20 - 29 mmol/L   Calcium 9.6 8.7 - 10.3 mg/dL   Total Protein 6.2 6.0 - 8.5 g/dL   Albumin 4.3 3.8 - 4.8 g/dL   Globulin, Total 1.9 1.5 - 4.5 g/dL   Albumin/Globulin Ratio 2.3 (H) 1.2 - 2.2   Bilirubin Total 0.3 0.0 - 1.2 mg/dL   Alkaline Phosphatase 151 (H) 44 - 121 IU/L   AST 20 0 - 40 IU/L   ALT 23 0 - 32 IU/L  Lipid Panel w/o Chol/HDL Ratio  Result Value Ref Range   Cholesterol, Total 160 100 - 199 mg/dL   Triglycerides 165 (H) 0 - 149 mg/dL   HDL 66 >39 mg/dL   VLDL Cholesterol Cal 27 5 - 40 mg/dL   LDL Chol Calc (NIH) 67 0 - 99 mg/dL  Amylase  Result Value Ref Range   Amylase 169 (H) 31 - 110 U/L  Lipase  Result Value Ref Range   Lipase 385 (H) 14 - 85  U/L  Magnesium  Result Value Ref Range   Magnesium 1.8 1.6 - 2.3 mg/dL      Assessment & Plan:   Problem List Items Addressed This Visit       Other   LUQ abdominal pain - Primary    Acute with worsening, suspect pancreatitis based on recent labs.  Educated her on this and her spouse.  She would prefer to avoid hospitalization, but is aware if worsening will need to attend there.   - At this time recommend bland diet with 5-6 small meals daily + avoid alcohol, caffeine.   - Increase water intake, want urine a pale yellow.   - Short burst of pain medication sent -- educated her not to take with her Ativan due to interaction risk.   - Has GI referral in place will see if can get in sooner then currently scheduled February date. - Stop Januvia, discussed with her this could be a trigger for her at this time and possible cause for pancreatitis.   - Labs: CBC, CMP, Amylase, Lipase, UA Return in one week for follow-up.        Relevant Orders   Urinalysis, Routine w reflex microscopic   Comprehensive metabolic panel   CBC with Differential/Platelet   Amylase   Lipase     Follow up plan: Return in about 1 week (around 10/16/2022) for Pancreatitis.

## 2022-10-09 NOTE — Assessment & Plan Note (Addendum)
Acute with worsening, suspect pancreatitis based on recent labs.  Educated her on this and her spouse.  She would prefer to avoid hospitalization, but is aware if worsening will need to attend there.   - At this time recommend bland diet with 5-6 small meals daily + avoid alcohol, caffeine.   - Increase water intake, want urine a pale yellow.   - Short burst of pain medication sent -- educated her not to take with her Ativan due to interaction risk.   - Has GI referral in place will see if can get in sooner then currently scheduled February date. - Stop Januvia, discussed with her this could be a trigger for her at this time and possible cause for pancreatitis.   - Labs: CBC, CMP, Amylase, Lipase, UA Return in one week for follow-up.

## 2022-10-09 NOTE — Patient Instructions (Signed)
Pancreatitis Eating Plan Pancreatitis is when your pancreas gets irritated and swollen (inflamed). The pancreas is a small gland behind your stomach. It helps your body digest food and regulate your blood sugar. Pancreatitis can affect how your body digests food, especially foods with fat. You may also have other symptoms such as pain in your abdomen (abdominal pain) or nausea. When you have pancreatitis, following a low-fat eating plan may help you manage symptoms and recover faster. Work with your health care provider or a dietitian to create an eating plan that is right for you. What are tips for following this plan? Reading food labels Use the information on food labels to help keep track of how much fat you eat: Check the serving size. Look for the amount of total fat in grams (g) in one serving. Low-fat foods have 3 g of fat or less per serving. Fat-free foods have 0.5 g of fat or less per serving. Keep track of how much fat you eat based on how many servings you eat. For example, if you eat two servings, the amount of fat you eat will be twice what is listed on the label. Shopping  Buy low-fat or nonfat foods, such as: Fresh, frozen, or canned fruits and vegetables. Grains, including pasta, bread, and rice. Lean meat, poultry, fish, and other protein foods. Low-fat or nonfat dairy. Avoid buying bakery products and other sweets made with whole milk, butter, and eggs. Avoid buying snack foods with added fat, such as anything with butter or cheese flavoring. Cooking Remove skin from poultry, and remove extra fat from meat. Limit the amount of fat and oil you use to 6 tsp (30 mL) or less per day. Cook using low-fat methods, such as boiling, broiling, grilling, steaming, or baking. Use spray oil to cook. Add fat-free chicken broth to add flavor and moisture. Avoid adding cream to thicken soups or sauces. Use other thickeners such as corn starch or tomato paste. Meal planning  Eat a  low-fat diet as told by your dietitian. For most people, this means having no more than 55-65 g of fat each day. Eat small, frequent meals throughout the day. For example, you may have 5 or 6 small meals instead of 3 large meals. Drink enough fluid to keep your urine pale yellow. Do not drink alcohol. Talk to your health care provider if you need help stopping. Limit how much caffeine you have, including black coffee, black and green tea, soft drinks with caffeine, and energy drinks. Plan to include a variety of foods in your diet. Include fruits, vegetables, whole grains and lean proteins, and low-fat or nonfat dairy. You need a balanced diet for good overall health. General information Let your health care provider or dietitian know if you have unplanned weight loss on this eating plan. You may be told to follow a clear liquid or soft food diet when symptoms come back, which is called a flare. Talk with your health care provider about how to manage your diet during symptoms of a flare. Take any vitamins or supplements as told by your health care provider. You may need to take: Extra vitamins that dissolve in fat (are fat soluble), such as vitamins A, D, E, and K. Nutritional supplements. Work with a Microbiologist, especially if you have other conditions such as obesity, osteoporosis, or diabetes mellitus. Some people need extra treatments, such as: Pills or capsules to replace enzymes (oral pancreatic enzyme replacement therapy). Feedings through a tube in the stomach or intestine (  enteral feedings). What foods should I avoid? Fruits Fried fruits. Fruits served with butter or cream. Vegetables Fried vegetables. Vegetables cooked with butter, cheese, or cream. Grains Biscuits, waffles, donuts, pastries, and croissants. Pies and cookies. Butter-flavored popcorn. Regular crackers. Meats and other proteins Fatty cuts of meat. Poultry with skin. Organ meats. Precooked or cured meat, such as sausages  or meat loaves. Whole eggs. Nuts and nut butters. Dairy Whole and 2% milk. Whole milk yogurt. Whole milk ice cream. Cream and half-and-half. Cheese, such as cream cheese. Sour cream. Beverages Wine, beer, and liquor. The items listed above may not be a complete list of foods and beverages you should avoid. Contact a dietitian for more information. Summary Pancreatitis can affect how your body digests food, especially foods with fat. When you have pancreatitis, it is recommended that you follow a low-fat eating plan to help you recover faster and manage symptoms. Do not drink alcohol. Limit the amount of caffeine you have, and drink enough fluid to keep your urine pale yellow. This information is not intended to replace advice given to you by your health care provider. Make sure you discuss any questions you have with your health care provider. Document Revised: 11/02/2021 Document Reviewed: 11/02/2021 Elsevier Patient Education  [place].

## 2022-10-10 LAB — COMPREHENSIVE METABOLIC PANEL
ALT: 22 IU/L (ref 0–32)
AST: 20 IU/L (ref 0–40)
Albumin/Globulin Ratio: 2.3 — ABNORMAL HIGH (ref 1.2–2.2)
Albumin: 4.8 g/dL (ref 3.8–4.8)
Alkaline Phosphatase: 168 IU/L — ABNORMAL HIGH (ref 44–121)
BUN/Creatinine Ratio: 19 (ref 12–28)
BUN: 19 mg/dL (ref 8–27)
Bilirubin Total: 0.3 mg/dL (ref 0.0–1.2)
CO2: 25 mmol/L (ref 20–29)
Calcium: 9.9 mg/dL (ref 8.7–10.3)
Chloride: 100 mmol/L (ref 96–106)
Creatinine, Ser: 0.99 mg/dL (ref 0.57–1.00)
Globulin, Total: 2.1 g/dL (ref 1.5–4.5)
Glucose: 201 mg/dL — ABNORMAL HIGH (ref 70–99)
Potassium: 4.1 mmol/L (ref 3.5–5.2)
Sodium: 141 mmol/L (ref 134–144)
Total Protein: 6.9 g/dL (ref 6.0–8.5)
eGFR: 59 mL/min/{1.73_m2} — ABNORMAL LOW (ref >59)

## 2022-10-10 LAB — CBC WITH DIFFERENTIAL/PLATELET
Basophils Absolute: 0.1 10*3/uL (ref 0.0–0.2)
Basos: 1 %
EOS (ABSOLUTE): 0.2 10*3/uL (ref 0.0–0.4)
Eos: 3 %
Hematocrit: 39.1 % (ref 34.0–46.6)
Hemoglobin: 13.3 g/dL (ref 11.1–15.9)
Immature Grans (Abs): 0 10*3/uL (ref 0.0–0.1)
Immature Granulocytes: 0 %
Lymphocytes Absolute: 2.8 10*3/uL (ref 0.7–3.1)
Lymphs: 40 %
MCH: 30.9 pg (ref 26.6–33.0)
MCHC: 34 g/dL (ref 31.5–35.7)
MCV: 91 fL (ref 79–97)
Monocytes Absolute: 0.5 10*3/uL (ref 0.1–0.9)
Monocytes: 7 %
Neutrophils Absolute: 3.4 10*3/uL (ref 1.4–7.0)
Neutrophils: 49 %
Platelets: 204 10*3/uL (ref 150–450)
RBC: 4.31 x10E6/uL (ref 3.77–5.28)
RDW: 12.8 % (ref 11.7–15.4)
WBC: 6.9 10*3/uL (ref 3.4–10.8)

## 2022-10-10 LAB — AMYLASE: Amylase: 145 U/L — ABNORMAL HIGH (ref 31–110)

## 2022-10-10 LAB — LIPASE: Lipase: 450 U/L — ABNORMAL HIGH (ref 14–85)

## 2022-10-10 NOTE — Progress Notes (Signed)
Good morning, please let Duyen know labs have returned: - Kidney function continues to show mild kidney disease, no worsening. - Liver testing is normal - Pancreas labs remain elevated, as we discussed stop Januvia -- we will see if this helps improve labs and symptoms.  Take pain medication as needed and focus on that bland diet as we discussed.   - CBC shows no infection.  Our referral coordinator was going to reach out to [facility] to see if you could be seen sooner there.  Any questions? Keep being amazing!!  Thank you for allowing me to participate in your care.  I appreciate you. Kindest regards, Shawnetta Lein

## 2022-10-14 NOTE — Patient Instructions (Incomplete)
Pancreatitis Eating Plan Pancreatitis is when your pancreas gets irritated and swollen (inflamed). The pancreas is a small gland behind your stomach. It helps your body digest food and regulate your blood sugar. Pancreatitis can affect how your body digests food, especially foods with fat. You may also have other symptoms such as pain in your abdomen (abdominal pain) or nausea. When you have pancreatitis, following a low-fat eating plan may help you manage symptoms and recover faster. Work with your health care provider or a dietitian to create an eating plan that is right for you. What are tips for following this plan? Reading food labels Use the information on food labels to help keep track of how much fat you eat: Check the serving size. Look for the amount of total fat in grams (g) in one serving. Low-fat foods have 3 g of fat or less per serving. Fat-free foods have 0.5 g of fat or less per serving. Keep track of how much fat you eat based on how many servings you eat. For example, if you eat two servings, the amount of fat you eat will be twice what is listed on the label. Shopping  Buy low-fat or nonfat foods, such as: Fresh, frozen, or canned fruits and vegetables. Grains, including pasta, bread, and rice. Lean meat, poultry, fish, and other protein foods. Low-fat or nonfat dairy. Avoid buying bakery products and other sweets made with whole milk, butter, and eggs. Avoid buying snack foods with added fat, such as anything with butter or cheese flavoring. Cooking Remove skin from poultry, and remove extra fat from meat. Limit the amount of fat and oil you use to 6 tsp (30 mL) or less per day. Cook using low-fat methods, such as boiling, broiling, grilling, steaming, or baking. Use spray oil to cook. Add fat-free chicken broth to add flavor and moisture. Avoid adding cream to thicken soups or sauces. Use other thickeners such as corn starch or tomato paste. Meal planning  Eat a  low-fat diet as told by your dietitian. For most people, this means having no more than 55-65 g of fat each day. Eat small, frequent meals throughout the day. For example, you may have 5 or 6 small meals instead of 3 large meals. Drink enough fluid to keep your urine pale yellow. Do not drink alcohol. Talk to your health care provider if you need help stopping. Limit how much caffeine you have, including black coffee, black and green tea, soft drinks with caffeine, and energy drinks. Plan to include a variety of foods in your diet. Include fruits, vegetables, whole grains and lean proteins, and low-fat or nonfat dairy. You need a balanced diet for good overall health. General information Let your health care provider or dietitian know if you have unplanned weight loss on this eating plan. You may be told to follow a clear liquid or soft food diet when symptoms come back, which is called a flare. Talk with your health care provider about how to manage your diet during symptoms of a flare. Take any vitamins or supplements as told by your health care provider. You may need to take: Extra vitamins that dissolve in fat (are fat soluble), such as vitamins A, D, E, and K. Nutritional supplements. Work with a Microbiologist, especially if you have other conditions such as obesity, osteoporosis, or diabetes mellitus. Some people need extra treatments, such as: Pills or capsules to replace enzymes (oral pancreatic enzyme replacement therapy). Feedings through a tube in the stomach or intestine (  enteral feedings). What foods should I avoid? Fruits Fried fruits. Fruits served with butter or cream. Vegetables Fried vegetables. Vegetables cooked with butter, cheese, or cream. Grains Biscuits, waffles, donuts, pastries, and croissants. Pies and cookies. Butter-flavored popcorn. Regular crackers. Meats and other proteins Fatty cuts of meat. Poultry with skin. Organ meats. Precooked or cured meat, such as sausages  or meat loaves. Whole eggs. Nuts and nut butters. Dairy Whole and 2% milk. Whole milk yogurt. Whole milk ice cream. Cream and half-and-half. Cheese, such as cream cheese. Sour cream. Beverages Wine, beer, and liquor. The items listed above may not be a complete list of foods and beverages you should avoid. Contact a dietitian for more information. Summary Pancreatitis can affect how your body digests food, especially foods with fat. When you have pancreatitis, it is recommended that you follow a low-fat eating plan to help you recover faster and manage symptoms. Do not drink alcohol. Limit the amount of caffeine you have, and drink enough fluid to keep your urine pale yellow. This information is not intended to replace advice given to you by your health care provider. Make sure you discuss any questions you have with your health care provider. Document Revised: 11/02/2021 Document Reviewed: 11/02/2021 Elsevier Patient Education  [place].

## 2022-10-16 ENCOUNTER — Encounter: Payer: Self-pay | Admitting: Nurse Practitioner

## 2022-10-16 ENCOUNTER — Ambulatory Visit (INDEPENDENT_AMBULATORY_CARE_PROVIDER_SITE_OTHER): Payer: Medicare Other | Admitting: Nurse Practitioner

## 2022-10-16 VITALS — BP 102/56 | HR 77 | Temp 97.7°F | Ht 65.0 in | Wt 154.0 lb

## 2022-10-16 DIAGNOSIS — E1165 Type 2 diabetes mellitus with hyperglycemia: Secondary | ICD-10-CM | POA: Diagnosis not present

## 2022-10-16 DIAGNOSIS — R1012 Left upper quadrant pain: Secondary | ICD-10-CM | POA: Diagnosis not present

## 2022-10-16 DIAGNOSIS — Z794 Long term (current) use of insulin: Secondary | ICD-10-CM

## 2022-10-16 NOTE — Progress Notes (Signed)
BP (!) 102/56   Pulse 77   Temp 97.7 F (36.5 C) (Oral)   Ht _0  (1.651 m)   Wt 154 lb (69.9 kg)   SpO2 94%   BMI 25.63 kg/m    Subjective:    Patient ID: Laura Rojas, female    DOB: 06/07/1946, 76 y.o.   MRN: [id number]  HPI: Laura Rojas is a 76 y.o. female  Chief Complaint  Patient presents with   Pancreatitis    Here for follow up from last week    ABDOMINAL PAIN  Follow-up from recent visit when we stopped Januvia.  Has had ongoing LUQ pain since August with some flank and back pain -- with elevation in pancreas labs.  Pain currently improving some, noticed a little bit this morning.  Yesterday it was good and last night was better.  Amylase and Lipase have been elevated on recent labs.  CT abdomen and pelvis on 07/28/22 noted no acute findings, but there were gallstones. Is scheduled to see GI -- January 16th.  Is taking Norco as needed, which did help.  Sugars have trended up some fasting without Januvia -- 200 range in morning -- taking Metformin, Basaglar, and Humalog. Duration:weeks Severity: 0/10 today Quality: sharp, dull, aching, and throbbing Location:  LUQ  Episode duration: 20 minutes or more Radiation: no Frequency: intermittent Alleviating factors: nothing, it goes away on own Aggravating factors: getting up in morning or at night Status: improving Treatments attempted: Tizanidine + Norco + stopped Januvia Fever: no Nausea: no Vomiting: no Weight loss: no Decreased appetite: no Diarrhea: no Constipation: no Blood in stool: no Heartburn: no Jaundice: no Rash: no Dysuria/urinary frequency: no Hematuria: no History of sexually transmitted disease: no Recurrent NSAID use: no   Relevant past medical, surgical, family and social history reviewed and updated as indicated. Interim medical history since our last visit reviewed. Allergies and medications reviewed and updated.  Review of Systems  Constitutional:  Negative for activity change,  appetite change, diaphoresis, fatigue and fever.  Respiratory:  Negative for cough, chest tightness and shortness of breath.   Cardiovascular:  Negative for chest pain, palpitations and leg swelling.  Gastrointestinal:  Positive for abdominal pain. Negative for abdominal distention, blood in stool, constipation, diarrhea, nausea and vomiting.  Endocrine: Negative for polydipsia, polyphagia and polyuria.  Neurological: Negative.   Psychiatric/Behavioral: Negative.     Per HPI unless specifically indicated above     Objective:    BP (!) 102/56   Pulse 77   Temp 97.7 F (36.5 C) (Oral)   Ht _1  (1.651 m)   Wt 154 lb (69.9 kg)   SpO2 94%   BMI 25.63 kg/m   Wt Readings from Last 3 Encounters:  10/16/22 154 lb (69.9 kg)  10/09/22 157 lb 8 oz (71.4 kg)  09/07/22 158 lb 6.4 oz (71.8 kg)    Physical Exam Vitals and nursing note reviewed.  Constitutional:      General: She is awake. She is not in acute distress.    Appearance: She is well-developed, well-groomed and overweight. She is not ill-appearing.  HENT:     Head: Normocephalic.     Right Ear: Hearing normal.     Left Ear: Hearing normal.  Eyes:     General: Lids are normal.        Right eye: No discharge.        Left eye: No discharge.     Conjunctiva/sclera: Conjunctivae normal.  Pupils: Pupils are equal, round, and reactive to light.  Neck:     Thyroid: No thyromegaly.     Vascular: Carotid bruit (bilateral) present.  Cardiovascular:     Rate and Rhythm: Normal rate and regular rhythm.     Heart sounds: Normal heart sounds. No murmur heard.    No gallop.  Pulmonary:     Effort: Pulmonary effort is normal. No accessory muscle usage or respiratory distress.     Breath sounds: No wheezing or rhonchi.  Abdominal:     General: Bowel sounds are normal. There is no distension.     Palpations: Abdomen is soft.     Tenderness: There is no abdominal tenderness. There is no right CVA tenderness, left CVA tenderness,  guarding or rebound.     Hernia: No hernia is present.  Musculoskeletal:     Cervical back: Normal range of motion and neck supple.     Right lower leg: No edema.     Left lower leg: No edema.  Skin:    General: Skin is warm and dry.  Neurological:     Mental Status: She is alert and oriented to person, place, and time.  Psychiatric:        Attention and Perception: Attention normal.        Mood and Affect: Mood normal.        Speech: Speech normal.        Behavior: Behavior normal. Behavior is cooperative.        Thought Content: Thought content normal.    Results for orders placed or performed in visit on 10/09/22  Microscopic Examination   Urine  Result Value Ref Range   WBC, UA 0-5 0 - 5 /hpf   RBC, Urine 0-2 0 - 2 /hpf   Epithelial Cells (non renal) 0-10 0 - 10 /hpf   Bacteria, UA None seen None seen/Few  Urinalysis, Routine w reflex microscopic  Result Value Ref Range   Specific Gravity, UA 1.015 1.005 - 1.030   pH, UA 6.0 5.0 - 7.5   Color, UA Yellow Yellow   Appearance Ur Clear Clear   Leukocytes,UA Trace (A) Negative   Protein,UA Negative Negative/Trace   Glucose, UA 1+ (A) Negative   Ketones, UA Negative Negative   RBC, UA Negative Negative   Bilirubin, UA Negative Negative   Urobilinogen, Ur 1.0 0.2 - 1.0 mg/dL   Nitrite, UA Negative Negative   Microscopic Examination See below:   Comprehensive metabolic panel  Result Value Ref Range   Glucose 201 (H) 70 - 99 mg/dL   BUN 19 8 - 27 mg/dL   Creatinine, Ser 0.99 0.57 - 1.00 mg/dL   eGFR 59 (L) >59 mL/min/1.73   BUN/Creatinine Ratio 19 12 - 28   Sodium 141 134 - 144 mmol/L   Potassium 4.1 3.5 - 5.2 mmol/L   Chloride 100 96 - 106 mmol/L   CO2 25 20 - 29 mmol/L   Calcium 9.9 8.7 - 10.3 mg/dL   Total Protein 6.9 6.0 - 8.5 g/dL   Albumin 4.8 3.8 - 4.8 g/dL   Globulin, Total 2.1 1.5 - 4.5 g/dL   Albumin/Globulin Ratio 2.3 (H) 1.2 - 2.2   Bilirubin Total 0.3 0.0 - 1.2 mg/dL   Alkaline Phosphatase 168 (H) 44  - 121 IU/L   AST 20 0 - 40 IU/L   ALT 22 0 - 32 IU/L  CBC with Differential/Platelet  Result Value Ref Range   WBC 6.9 3.4 - 10.8  x10E3/uL   RBC 4.31 3.77 - 5.28 x10E6/uL   Hemoglobin 13.3 11.1 - 15.9 g/dL   Hematocrit 39.1 34.0 - 46.6 %   MCV 91 79 - 97 fL   MCH 30.9 26.6 - 33.0 pg   MCHC 34.0 31.5 - 35.7 g/dL   RDW 12.8 11.7 - 15.4 %   Platelets 204 150 - 450 x10E3/uL   Neutrophils 49 Not Estab. %   Lymphs 40 Not Estab. %   Monocytes 7 Not Estab. %   Eos 3 Not Estab. %   Basos 1 Not Estab. %   Neutrophils Absolute 3.4 1.4 - 7.0 x10E3/uL   Lymphocytes Absolute 2.8 0.7 - 3.1 x10E3/uL   Monocytes Absolute 0.5 0.1 - 0.9 x10E3/uL   EOS (ABSOLUTE) 0.2 0.0 - 0.4 x10E3/uL   Basophils Absolute 0.1 0.0 - 0.2 x10E3/uL   Immature Granulocytes 0 Not Estab. %   Immature Grans (Abs) 0.0 0.0 - 0.1 x10E3/uL  Amylase  Result Value Ref Range   Amylase 145 (H) 31 - 110 U/L  Lipase  Result Value Ref Range   Lipase 450 (H) 14 - 85 U/L      Assessment & Plan:   Problem List Items Addressed This Visit       Endocrine   Type 2 diabetes mellitus with hyperglycemia, with long-term current use of insulin (HCC)    Chronic, ongoing, followed by endocrinology in past with A1c 7.7% last visit, mild trend up but remaining well below her previous levels in 9-10 range.  She wishes to avoid return to endo at this time as her previous provider left.  Will continue Basaglar but increase slightly to 13 units (wrote instructions down for her on this) + cut back on her sliding scale with this if needed, continue Metformin.  Continue collaboration with CCM team in office.   Bring Libre to next visit.  Return to office in 3 months -- goal to avoid hypoglycemia.          Other   LUQ abdominal pain - Primary    Acute and improving since stopping Januvia, suspect pancreatitis based on recent labs.  Educated her on this.  She would prefer to avoid hospitalization, but is aware if worsening will need to attend  there.   - At this time recommend continue bland diet with 5-6 small meals daily + avoid alcohol, caffeine.  Can return to regular diet in next few days if ongoing improvement. - Increase water intake, want urine a pale yellow.   - May continue to use Norco short burst as needed. - Has GI referral in place and scheduled January 16th. - Maintain off Januvia -- will adjust insulin as needed for BS. - Labs: CBC, CMP, Amylase, Lipase       Relevant Orders   Comprehensive metabolic panel   CBC with Differential/Platelet   Amylase   Lipase     Follow up plan: Return for as scheduled in January with me.

## 2022-10-16 NOTE — Assessment & Plan Note (Signed)
Chronic, ongoing, followed by endocrinology in past with A1c 7.7% last visit, mild trend up but remaining well below her previous levels in 9-10 range.  She wishes to avoid return to endo at this time as her previous provider left.  Will continue Basaglar but increase slightly to 13 units (wrote instructions down for her on this) + cut back on her sliding scale with this if needed, continue Metformin.  Continue collaboration with CCM team in office.   Bring Libre to next visit.  Return to office in 3 months -- goal to avoid hypoglycemia.

## 2022-10-16 NOTE — Assessment & Plan Note (Signed)
Acute and improving since stopping Januvia, suspect pancreatitis based on recent labs.  Educated her on this.  She would prefer to avoid hospitalization, but is aware if worsening will need to attend there.   - At this time recommend continue bland diet with 5-6 small meals daily + avoid alcohol, caffeine.  Can return to regular diet in next few days if ongoing improvement. - Increase water intake, want urine a pale yellow.   - May continue to use Norco short burst as needed. - Has GI referral in place and scheduled January 16th. - Maintain off Januvia -- will adjust insulin as needed for BS. - Labs: CBC, CMP, Amylase, Lipase

## 2022-10-17 LAB — CBC WITH DIFFERENTIAL/PLATELET
Basophils Absolute: 0 10*3/uL (ref 0.0–0.2)
Basos: 1 %
EOS (ABSOLUTE): 0.3 10*3/uL (ref 0.0–0.4)
Eos: 3 %
Hematocrit: 41.2 % (ref 34.0–46.6)
Hemoglobin: 14.2 g/dL (ref 11.1–15.9)
Immature Grans (Abs): 0 10*3/uL (ref 0.0–0.1)
Immature Granulocytes: 0 %
Lymphocytes Absolute: 3 10*3/uL (ref 0.7–3.1)
Lymphs: 37 %
MCH: 31.8 pg (ref 26.6–33.0)
MCHC: 34.5 g/dL (ref 31.5–35.7)
MCV: 92 fL (ref 79–97)
Monocytes Absolute: 0.6 10*3/uL (ref 0.1–0.9)
Monocytes: 8 %
Neutrophils Absolute: 4.1 10*3/uL (ref 1.4–7.0)
Neutrophils: 51 %
Platelets: 273 10*3/uL (ref 150–450)
RBC: 4.47 x10E6/uL (ref 3.77–5.28)
RDW: 13 % (ref 11.7–15.4)
WBC: 8 10*3/uL (ref 3.4–10.8)

## 2022-10-17 LAB — COMPREHENSIVE METABOLIC PANEL
ALT: 24 IU/L (ref 0–32)
AST: 22 IU/L (ref 0–40)
Albumin/Globulin Ratio: 1.9 (ref 1.2–2.2)
Albumin: 4.6 g/dL (ref 3.8–4.8)
Alkaline Phosphatase: 138 IU/L — ABNORMAL HIGH (ref 44–121)
BUN/Creatinine Ratio: 20 (ref 12–28)
BUN: 22 mg/dL (ref 8–27)
Bilirubin Total: 0.4 mg/dL (ref 0.0–1.2)
CO2: 22 mmol/L (ref 20–29)
Calcium: 10.1 mg/dL (ref 8.7–10.3)
Chloride: 102 mmol/L (ref 96–106)
Creatinine, Ser: 1.08 mg/dL — ABNORMAL HIGH (ref 0.57–1.00)
Globulin, Total: 2.4 g/dL (ref 1.5–4.5)
Glucose: 140 mg/dL — ABNORMAL HIGH (ref 70–99)
Potassium: 4.3 mmol/L (ref 3.5–5.2)
Sodium: 139 mmol/L (ref 134–144)
Total Protein: 7 g/dL (ref 6.0–8.5)
eGFR: 53 mL/min/{1.73_m2} — ABNORMAL LOW (ref >59)

## 2022-10-17 LAB — LIPASE: Lipase: 109 U/L — ABNORMAL HIGH (ref 14–85)

## 2022-10-17 LAB — AMYLASE: Amylase: 79 U/L (ref 31–110)

## 2022-10-17 NOTE — Progress Notes (Signed)
Contacted via [place] morning Alys, your labs have returned: - Kidney function continues to show baseline kidney disease with no worsening -- creatinine and eGFR.  Liver function, AST and ALT, is normal.  Alkaline Phosphatase remains a little elevated, which can go with the gallstones noted. - CBC shows normal levels. - Pancreas labs -- Amylase and Lipase are drastically improving with stopping Januvia.  In fact Amylase is in normal range now and Lipase trending down to normal.  Great news!!!  Any questions?  Let me know how sugars are doing with increase in [place], as we will have to work with the insulin now.   Keep being amazing!!  Thank you for allowing me to participate in your care.  I appreciate you. Kindest regards, Nekesha Font

## 2022-10-26 DIAGNOSIS — M159 Polyosteoarthritis, unspecified: Secondary | ICD-10-CM | POA: Diagnosis not present

## 2022-10-26 DIAGNOSIS — M0609 Rheumatoid arthritis without rheumatoid factor, multiple sites: Secondary | ICD-10-CM | POA: Diagnosis not present

## 2022-10-26 DIAGNOSIS — Z79899 Other long term (current) drug therapy: Secondary | ICD-10-CM | POA: Diagnosis not present

## 2022-11-16 ENCOUNTER — Other Ambulatory Visit: Payer: Self-pay | Admitting: Nurse Practitioner

## 2022-11-17 NOTE — Telephone Encounter (Signed)
Requested medication (s) are due for refill today: yes  Requested medication (s) are on the active medication list: yes   Last refill:  08/18/22 #180  Future visit scheduled: yes   Notes to clinic:  Please review for refill. Refill not delegated per protocol.     Requested Prescriptions  Pending Prescriptions Disp Refills   LORazepam (ATIVAN) 0.5 MG tablet [Pharmacy Med Name: LORAZEPAM 0.5 MG TABLET] 180 tablet 0    Sig: TAKE 1 TAB BY MOUTH TWICE A DAY (USE THE ADDITIONAL 30 TABS ONLY SPARINGLY & ONLY FOR SEVERE ANXIETY)     Not Delegated - Psychiatry: Anxiolytics/Hypnotics 2 Failed - 11/16/2022  7:59 PM      Failed - This refill cannot be delegated      Passed - Urine Drug Screen completed in last 360 days      Passed - Patient is not pregnant      Passed - Valid encounter within last 6 months    Recent Outpatient Visits           1 month ago LUQ abdominal pain   [place], [place] T, NP   1 month ago LUQ abdominal pain   [place], [place] T, NP   2 months ago Type 2 diabetes mellitus with hyperglycemia, with long-term current use of insulin ([facility])   [place], [place] T, NP   3 months ago LUQ abdominal pain   [place], [place] T, NP   4 months ago LUQ abdominal pain   [place], Barbaraann Faster, NP       Future Appointments             In 3 weeks Cannady, Barbaraann Faster, NP [facility]   In 2 months Croitor[place], [place], MD [place]. [place]   In 9 months Ralene Bathe, MD [place]

## 2022-11-23 ENCOUNTER — Other Ambulatory Visit: Payer: Self-pay | Admitting: Nurse Practitioner

## 2022-11-23 DIAGNOSIS — M0609 Rheumatoid arthritis without rheumatoid factor, multiple sites: Secondary | ICD-10-CM | POA: Diagnosis not present

## 2022-11-23 DIAGNOSIS — E1165 Type 2 diabetes mellitus with hyperglycemia: Secondary | ICD-10-CM

## 2022-11-24 NOTE — Telephone Encounter (Signed)
Requested medication (s) are due for refill today: yes  Requested medication (s) are on the active medication list: yes  Last refill:  11/30/20  Future visit scheduled: yes  Notes to clinic:  Unable to refill per protocol, last refill by another provider.  Historical medication, routing for approval.     Requested Prescriptions  Pending Prescriptions Disp Refills   BD PEN NEEDLE NANO U/F 32G X 4 MM MISC [Pharmacy Med Name: BD UF NANO PEN NEEDLE 4MMX32G] 100 each     Sig: USE 1 EACH 4 (FOUR) TIMES DAILY     Endocrinology: Diabetes - Testing Supplies Passed - 11/23/2022 10:50 AM      Passed - Valid encounter within last 12 months    Recent Outpatient Visits           1 month ago LUQ abdominal pain   [place], [place] T, NP   1 month ago LUQ abdominal pain   [place], [place] T, NP   2 months ago Type 2 diabetes mellitus with hyperglycemia, with long-term current use of insulin ([place])   [place], [place] T, NP   3 months ago LUQ abdominal pain   [place], [place] T, NP   4 months ago LUQ abdominal pain   [place], Barbaraann Faster, NP       Future Appointments             In 2 weeks Cannady, Barbaraann Faster, NP [facility]   In 2 months Croitoru, [place], MD [place]. [place]   In 9 months Ralene Bathe, MD [place]

## 2022-11-29 ENCOUNTER — Ambulatory Visit
Admission: RE | Admit: 2022-11-29 | Discharge: 2022-11-29 | Disposition: A | Discharge status: Home / Self Care | Payer: Medicare Other | Source: Ambulatory Visit | Attending: Nurse Practitioner | Admitting: Nurse Practitioner

## 2022-11-29 DIAGNOSIS — H43813 Vitreous degeneration, bilateral: Secondary | ICD-10-CM | POA: Diagnosis not present

## 2022-11-29 DIAGNOSIS — Z1231 Encounter for screening mammogram for malignant neoplasm of breast: Secondary | ICD-10-CM | POA: Insufficient documentation

## 2022-11-29 DIAGNOSIS — E11319 Type 2 diabetes mellitus with unspecified diabetic retinopathy without macular edema: Secondary | ICD-10-CM | POA: Diagnosis not present

## 2022-11-29 LAB — HM DIABETES EYE EXAM

## 2022-11-30 NOTE — Progress Notes (Signed)
Contacted via MyChart   Normal mammogram, may repeat in one year:)

## 2022-12-01 ENCOUNTER — Other Ambulatory Visit: Payer: Self-pay | Admitting: Nurse Practitioner

## 2022-12-04 ENCOUNTER — Ambulatory Visit: Payer: Medicare Other | Attending: Cardiovascular Disease | Admitting: Cardiovascular Disease

## 2022-12-04 ENCOUNTER — Encounter: Payer: Self-pay | Admitting: Cardiovascular Disease

## 2022-12-04 VITALS — BP 130/66 | HR 63 | Ht 65.0 in | Wt 153.6 lb

## 2022-12-04 DIAGNOSIS — I495 Sick sinus syndrome: Secondary | ICD-10-CM | POA: Diagnosis not present

## 2022-12-04 DIAGNOSIS — I771 Stricture of artery: Secondary | ICD-10-CM | POA: Diagnosis not present

## 2022-12-04 DIAGNOSIS — I25118 Atherosclerotic heart disease of native coronary artery with other forms of angina pectoris: Secondary | ICD-10-CM | POA: Insufficient documentation

## 2022-12-04 DIAGNOSIS — E1151 Type 2 diabetes mellitus with diabetic peripheral angiopathy without gangrene: Secondary | ICD-10-CM | POA: Diagnosis not present

## 2022-12-04 DIAGNOSIS — I10 Essential (primary) hypertension: Secondary | ICD-10-CM | POA: Insufficient documentation

## 2022-12-04 DIAGNOSIS — E782 Mixed hyperlipidemia: Secondary | ICD-10-CM | POA: Insufficient documentation

## 2022-12-04 NOTE — Progress Notes (Unsigned)
patient ID: Laura Rojas, female   DOB: Dec 12, 1945, 77 y.o.   MRN: [id number]    Cardiology Office Note    Date:  12/05/2022   ID:  Laura Rojas, DOB 05-Feb-1946, MRN [id number]  PCP:  Venita Lick, NP  Cardiologist:   Sanda Klein, MD   Chief complaint: CAD follow up  History of Present Illness:  Laura Rojas is a 77 y.o. female with coronary disease, PAD (left subclavian stenosis), DM, HTN and hyperlipidemia, polymyalgia rheumatica.  Has been an uneventful year from a cardiovascular point of view. The patient specifically denies any chest pain at rest exertion, dyspnea at rest or with exertion, orthopnea, paroxysmal nocturnal dyspnea, syncope, palpitations, focal neurological deficits, intermittent claudication, lower extremity edema, unexplained weight gain, cough, hemoptysis or wheezing.  Previously, we  had to stop her beta-blocker due to complaints of fatigue, with resolution of her complaints.  She is taking statin, aspirin and clopidogrel.  LDL 67 on 08/08/2022.  She has an excellent HDL.  Her hemoglobin A1c is slightly elevated at 7.7%.  Borderline renal function creatinine of 1.08, similar to last year.  She is not a smoker.  She tried to take Jardiance but this caused severe bilateral flank pain that resolved when she stopped the medication.  Her lipase was markedly elevated at 450, consistent with pancreatitis.  After stopping the medication the lipase decreased to 109.  In the past she took Trulicity but I remember her telling me that "it was not helping".  She is no longer on that medication although she does not remember whether it was stopped due to side effects or ineffectiveness.  She is currently on insulin and a low-dose of metformin  Her most recent LDL cholesterol was 56 on 08/02/2021, but her glycemic control has deteriorated with the most recent hemoglobin A1c 8.2%.  She is receiving treatment with Trulicity, but this is not really helping.  She has borderline  renal function with a creatinine of 1.12.  She does not smoke.   She had stents placed in the proximal LAD and proximal RCA (2007, 3.0 x 28 mm drug-eluting Cypher stent to RCA, 3.5 x 33 mm drug-eluting Cypher stent and proximal LAD). She is known to have an untreated stenosis in the very distal portion of the LAD and a 60% "jailed" ostial diagonal stenosis at the level of the proximal LAD stent. She has preserved LV systolic function and has never had congestive heart failure. Her most recent functional study was a nuclear stress test in 2013 that showed normal perfusion and normal LVEF. Her most recent cardiac catheterization was performed in 2009 and both the LAD and RCA stents were found to be widely patent. Significant comorbidities include diabetes mellitus type 2, hyperlipidemia and hypertension.    Past Medical History:  Diagnosis Date   Arthritis    Shoulders, hands, feet   CAD (coronary artery disease) 12/31/2013   2007, 3.0 x 28 mm drug-eluting Cypher stent to RCA, 3.5 x 33 mm drug-eluting Cypher stent and proximal LAD) Consent by 2009 Normal perfusion by mildly 2013    Diabetes mellitus without complication (HCC)    Dyslipidemia    GERD (gastroesophageal reflux disease)    Hypertension    Polymyalgia rheumatica syndrome ([place])    Subclavian artery stenosis, left ([place]) 01/01/2015    Past Surgical History:  Procedure Laterality Date   BREAST EXCISIONAL BIOPSY Right 1970   EXCISIONAL - NEG   BREAST EXCISIONAL BIOPSY Left 1973   EXCISIONAL -  NEG   BREAST SURGERY     CARDIAC CATHETERIZATION  08/25/2008   patent stents   CAROTID DUPLEX  10/2006   NORMAL CAROTID ARTERY DUPLEX   CATARACT EXTRACTION W/PHACO Left 04/13/2020   Procedure: CATARACT EXTRACTION PHACO AND INTRAOCULAR LENS PLACEMENT (IOC) LEFT DIABETIC 4.69  00:44.0;  Surgeon: Birder Robson, MD;  Location: [facility];  Service: Ophthalmology;  Laterality: Left;  Diabetic - insulin and oral meds   CATARACT EXTRACTION  W/PHACO Right 05/04/2020   Procedure: CATARACT EXTRACTION PHACO AND INTRAOCULAR LENS PLACEMENT (IOC) RIGHT DIABETIC 3.92  00:27.1;  Surgeon: Birder Robson, MD;  Location: [facility];  Service: Ophthalmology;  Laterality: Right;  Diabetic - insulin and oral meds   COLONOSCOPY WITH PROPOFOL N/A 08/19/2019   Procedure: COLONOSCOPY WITH PROPOFOL;  Surgeon: Lucilla Lame, MD;  Location[facility] END[facility]COPY;  Service: Endoscopy;  Laterality: N/A;   CORONARY ANGIOPLASTY WITH STENT PLACEMENT  09/17/2006   PTCA & stenting LAD   ESOPHAGOGASTRODUODENOSCOPY N/A 04/27/2015   Procedure: ESOPHAGOGASTRODUODENOSCOPY (EGD);  Surgeon: Lucilla Lame, MD;  Location: [facility] ENDOSCOPY;  Service: Endoscopy;  Laterality: N/A;   JOINT REPLACEMENT     NM MYOCAR MULTIPLE W/SPECT  11/2011   EF 72%.NORMAL MYOCARDIAL PERFUSION STUDY   REPLACEMENT TOTAL KNEE  2003 & 2004   TRANSTHORACIC ECHOCARDIOGRAM  09/2006   EF =>55%. IMPAIRED LV RELAXATION. MILD MITRAL ANNULAR CALCIFICATION. AV-MILDLY SCLEROTIC. NO AS.   VAGINAL HYSTERECTOMY  1983    Current Medications: Outpatient Medications Prior to Visit  Medication Sig Dispense Refill   amLODipine (NORVASC) 2.5 MG tablet Take 1 tablet (2.5 mg total) by mouth daily. 90 tablet 4   aspirin 81 MG tablet Take 81 mg by mouth daily.     atorvastatin (LIPITOR) 40 MG tablet TAKE 1 TABLET BY MOUTH EVERY DAY** STOP THE SIMVASTATIN 90 tablet 4   BD PEN NEEDLE NANO U/F 32G X 4 MM MISC SMARTSIG:1 Each SUB-Q 4 Times Daily     certolizumab pegol (CIMZIA) 2 X 200 MG/ML PSKT Inject into the skin.     cholecalciferol (VITAMIN D3) 25 MCG (1000 UT) tablet Take 1,000 Units by mouth daily.     clopidogrel (PLAVIX) 75 MG tablet TAKE 1 TABLET BY MOUTH EVERY DAY 90 tablet 1   folic acid (FOLVITE) 1 MG tablet folic acid 1 mg tablet  TAKE 1 TABLET BY MOUTH EVERY DAY     Gabapentin, Once-Daily, 300 MG TABS Take 300 mg by mouth at bedtime. 90 tablet 4   hydrochlorothiazide (MICROZIDE) 12.5 MG capsule TAKE  1 CAPSULE BY MOUTH EVERY DAY 90 capsule 4   hydrOXYzine (ATARAX) 25 MG tablet Take 1 tablet by mouth at bedtime.     Insulin Glargine (BASAGLAR KWIKPEN) 100 UNIT/ML Inject 10 Units into the skin at bedtime. 15 mL 2   Insulin Pen Needle (BD PEN NEEDLE NANO U/F) 32G X 4 MM MISC Use 1 each 4 (four) times daily     Insulin Pen Needle (BD PEN NEEDLE NANO U/F) 32G X 4 MM MISC USE 1 EACH 4 (FOUR) TIMES DAILY 100 each 4   Iron-Vitamin C 65-125 MG TABS Take 1 tablet by mouth daily. 90 tablet 4   LORazepam (ATIVAN) 0.5 MG tablet TAKE 1 TAB BY MOUTH TWICE A DAY (USE THE ADDITIONAL 30 TABS ONLY SPARINGLY & ONLY FOR SEVERE ANXIETY) 180 tablet 0   losartan (COZAAR) 100 MG tablet Take 1 tablet (100 mg total) by mouth daily. 90 tablet 4   Magnesium 400 MG TABS Take  1 tablet by mouth daily.     metFORMIN (GLUCOPHAGE) 1000 MG tablet Take 0.5 tablets (500 mg total) by mouth 2 (two) times daily. 90 tablet 4   Multiple Vitamin (MULTIVITAMIN) tablet Take 1 tablet by mouth daily.     NOVOLOG FLEXPEN 100 UNIT/ML FlexPen INJECT 10 UNITS INTO THE SKIN 3 (THREE) TIMES DAILY. INJECT PER SLIDING SCALE REGIMEN. 15 mL 2   Omega-3 Fatty Acids (FISH OIL) 1200 MG CAPS Take 1,200 mg by mouth daily.     pantoprazole (PROTONIX) 40 MG tablet Take 1 tablet (40 mg total) by mouth daily. 90 tablet 4   sertraline (ZOLOFT) 100 MG tablet TAKE 1.5 TABLETS BY MOUTH DAILY 135 tablet 4   vitamin B-12 (CYANOCOBALAMIN) 1000 MCG tablet Take 1,000 mcg by mouth daily.     Continuous Blood Gluc Sensor (FREESTYLE LIBRE 14 DAY SENSOR) MISC USE 1 KIT EVERY 14 (FOURTEEN) DAYS 4 each 3   valACYclovir (VALTREX) 1000 MG tablet Take 1,000 mg by mouth daily.     albuterol (VENTOLIN HFA) 108 (90 Base) MCG/ACT inhaler TAKE 2 PUFFS BY MOUTH EVERY 6 HOURS AS NEEDED FOR WHEEZE OR SHORTNESS OF BREATH (Patient not taking: Reported on 12/04/2022) 18 g 0   amoxicillin (AMOXIL) 500 MG capsule Take 500 mg by mouth daily in the afternoon. Take 4 capsules prior to dental  procedures (Patient not taking: Reported on 12/04/2022)     clobetasol cream (TEMOVATE) 0.05 % Spot treat affected areas twice a day until improved. Avoid face, groin, axilla. (Patient not taking: Reported on 12/04/2022) 30 g 1   mometasone (ELOCON) 0.1 % cream Apply 1 Application topically daily as needed (Rash). (Patient not taking: Reported on 12/04/2022) 45 g 2   neomycin-polymyxin b-dexamethasone (MAXITROL) 3.5-10000-0.1 SUSP Place 1 drop into the left eye 3 (three) times daily. (Patient not taking: Reported on 12/04/2022)     tiZANidine (ZANAFLEX) 2 MG tablet Take 1 tablet (2 mg total) by mouth every 8 (eight) hours as needed for muscle spasms. (Patient not taking: Reported on 12/04/2022) 30 tablet 0   triamcinolone cream (KENALOG) 0.1 % Apply 1 application. topically 2 (two) times daily. (Patient not taking: Reported on 12/04/2022) 30 g 2   No facility-administered medications prior to visit.     Allergies:   Compazine [prochlorperazine edisylate], Altace [ramipril], Crestor [rosuvastatin calcium], Reglan [metoclopramide], Sulfa antibiotics, Actos [pioglitazone], Amlodipine, Latex, and Lipitor [atorvastatin]   Social History   Socioeconomic History   Marital status: Married    Spouse name: Not on file   Number of children: Not on file   Years of education: Not on file   Highest education level: Some college, no degree  Occupational History   Not on file  Tobacco Use   Smoking status: Never   Smokeless tobacco: Never  Vaping Use   Vaping Use: Never used  Substance and Sexual Activity   Alcohol use: No    Alcohol/week: 0.0 standard drinks of alcohol   Drug use: Never   Sexual activity: Not on file  Other Topics Concern   Not on file  Social History Narrative   Not on file   Social Determinants of Health   Financial Resource Strain: Low Risk  (09/04/2022)   Overall Financial Resource Strain (CARDIA)    Difficulty of Paying Living Expenses: Not hard at all  Food Insecurity: No Food  Insecurity (09/04/2022)   Hunger Vital Sign    Worried About Running Out of Food in the Last Year: Never true  Ran Out of Food in the Last Year: Never true  Transportation Needs: No Transportation Needs (09/04/2022)   PRAPARE - Hydrologist (Medical): No    Lack of Transportation (Non-Medical): No  Physical Activity: Insufficiently Active (09/04/2022)   Exercise Vital Sign    Days of Exercise per Week: 3 days    Minutes of Exercise per Session: 30 min  Stress: No Stress Concern Present (09/04/2022)   [place]    Feeling of Stress : Not at all  Social Connections: [place] (09/04/2022)   Social Connection and Isolation Panel [NHANES]    Frequency of Communication with Friends and Family: More than three times a week    Frequency of Social Gatherings with Friends and Family: Twice a week    Attends Religious Services: More than 4 times per year    Active Member of [employer] or Organizations: Yes    Attends Music therapist: More than 4 times per year    Marital Status: Married     Family History:  The patient's family history includes Brain cancer in her grandchild; Breast cancer (age of onset: 60) in her mother; Diabetes in her sister; Heart disease in her maternal grandfather and maternal grandmother.   ROS:   Please see the history of present illness.    ROS all other systems are reviewed and are negative  PHYSICAL EXAM:   VS:  BP 130/66 (BP Location: Left Arm, Patient Position: Sitting, Cuff Size: Normal)   Pulse 63   Ht '5\' 5"'$  (1.651 m)   Wt 153 lb 9.6 oz (69.7 kg)   SpO2 98%   BMI 25.56 kg/m      General: Alert, oriented x3, no distress Head: no evidence of trauma, PERRL, EOMI, no exophtalmos or lid lag, no myxedema, no xanthelasma; normal ears, nose and oropharynx Neck: normal jugular venous pulsations and no hepatojugular reflux; brisk carotid pulses without  delay and no carotid bruits Chest: clear to auscultation, no signs of consolidation by percussion or palpation, normal fremitus, symmetrical and full respiratory excursions Cardiovascular: normal position and quality of the apical impulse, regular rhythm, normal first and second heart sounds, no murmurs, rubs or gallops Abdomen: no tenderness or distention, no masses by palpation, no abnormal pulsatility or arterial bruits, normal bowel sounds, no hepatosplenomegaly Extremities: no clubbing, cyanosis or edema; 2+ radial, ulnar and brachial pulses bilaterally; 2+ right femoral, posterior tibial and dorsalis pedis pulses; 2+ left femoral, posterior tibial and dorsalis pedis pulses; no subclavian or femoral bruits Neurological: grossly nonfocal Psych: Normal mood and affect    Wt Readings from Last 3 Encounters:  12/04/22 153 lb 9.6 oz (69.7 kg)  10/16/22 154 lb (69.9 kg)  10/09/22 157 lb 8 oz (71.4 kg)      Studies/Labs Reviewed:   EKG:  EKG is ordered today.  It shows normal sinus rhythm with slightly reduced voltage throughout and delayed R wave progression with a transition zone in leads V4 through V4 T wave inversion V1-V3, QTc 433 ms, not really changed from previous tracings.  Recent Labs: 01/20/2022: TSH 1.360 09/07/2022: Magnesium 1.8 10/16/2022: ALT 24; BUN 22; Creatinine, Ser 1.08; Hemoglobin 14.2; Platelets 273; Potassium 4.3; Sodium 139   Lipid Panel    Component Value Date/Time   CHOL 160 09/07/2022 0912   CHOL 170 05/08/2019 0939   TRIG 165 (H) 09/07/2022 0912   TRIG 367 (H) 05/08/2019 0939   HDL 66 09/07/2022  0912   CHOLHDL 2.9 08/28/2018 1054   VLDL 73 (H) 05/08/2019 0939   LDLCALC 67 09/07/2022 0912    ASSESSMENT:    1. Coronary artery disease of native artery of native heart with stable angina pectoris ([place])   2. Essential hypertension   3. SSS (sick sinus syndrome) ([place])   4. Diabetes mellitus with peripheral vascular disease ([place])   5. Mixed  hyperlipidemia   6. Stenosis of left subclavian artery (HCC)      PLAN:  In order of problems listed above:  CAD: Intolerant to beta-blockers due to fatigue and only taking amlodipine as an antianginal medication, currently asymptomatic.  On dual antiplatelet with aspirin and clopidogrel and statin.  HTN: controlled. SSS: Avoid medication with negative chronotropic effect, but does not need a pacemaker yet.  Had symptoms consistent with chronotropic incompetence that resolved after he stopped the beta-blocker.   DM: Discussed the theoretical benefit of SGLT2 inh and GLP1 agonists over insulin in patients with CV disease, but she has not done well on these meds so far. HLP: Excellent LDL and HDL levels and triglycerides barely above desirable range.  Continue same medications. PAD: Asymptomatic.  Has a slightly lower blood pressure in the left upper extremity suggesting left subclavian stenosis, although this was not seen on previous duplex ultrasonography in 2016.  She does not have symptoms of claudication or vertebral steal syndrome and had antegrade flow in both vertebral arteries.     Medication Adjustments/Labs and Tests Ordered: Current medicines are reviewed at length with the patient today.  Concerns regarding medicines are outlined above.  Medication changes, Labs and Tests ordered today are listed in the Patient Instructions below. Patient Instructions  Medication Instructions:  Your physician recommends that you continue on your current medications as directed. Please refer to the Current Medication list given to you today.  *If you need a refill on your cardiac medications before your next appointment, please call your pharmacy*   Follow-Up: At [facility], you and your health needs are our priority.  As part of our continuing mission to provide you with exceptional heart care, we have created designated Provider Care Teams.  These Care Teams include your primary  Cardiologist (physician) and Advanced Practice Providers (APPs -  Physician Assistants and Nurse Practitioners) who all work together to provide you with the care you need, when you need it.  We recommend signing up for the patient portal called "MyChart".  Sign up information is provided on this After Visit Summary.  MyChart is used to connect with patients for Virtual Visits (Telemedicine).  Patients are able to view lab/test results, encounter notes, upcoming appointments, etc.  Non-urgent messages can be sent to your provider as well.   To learn more about what you can do with MyChart, go to [email or web address].    Your next appointment:   12 month(s)  The format for your next appointment:   In Person  Provider:   Sanda Klein, MD       Signed, Sanda Klein, MD  12/05/2022 5:11 PM    [facility] [address], [place], [place]  [address] Phone: [phone]; Fax: [phone]

## 2022-12-04 NOTE — Patient Instructions (Signed)
Medication Instructions:  Your physician recommends that you continue on your current medications as directed. Please refer to the Current Medication list given to you today.  *If you need a refill on your cardiac medications before your next appointment, please call your pharmacy*   Follow-Up: At [facility], you and your health needs are our priority.  As part of our continuing mission to provide you with exceptional heart care, we have created designated Provider Care Teams.  These Care Teams include your primary Cardiologist (physician) and Advanced Practice Providers (APPs -  Physician Assistants and Nurse Practitioners) who all work together to provide you with the care you need, when you need it.  We recommend signing up for the patient portal called "MyChart".  Sign up information is provided on this After Visit Summary.  MyChart is used to connect with patients for Virtual Visits (Telemedicine).  Patients are able to view lab/test results, encounter notes, upcoming appointments, etc.  Non-urgent messages can be sent to your provider as well.   To learn more about what you can do with MyChart, go to [email or web address].    Your next appointment:   12 month(s)  The format for your next appointment:   In Person  Provider:   Mihai Croitoru, MD 

## 2022-12-04 NOTE — Telephone Encounter (Signed)
Requested medication (s) are due for refill today:   Freestyle yes;  Valtrex is historical  Requested medication (s) are on the active medication list:   Yes for both  Future visit scheduled:   Yes 12/08/2022   Last ordered: Freestyle Libre 14 06/23/2022 4 each, 3 refills;    Valtrex 12/17/2021 listed historical  Returned because the Valtrex is a historical med.   Requested Prescriptions  Pending Prescriptions Disp Refills   Continuous Blood Gluc Sensor (FREESTYLE LIBRE 14 DAY SENSOR) MISC [Pharmacy Med Name: FREESTYLE LIBRE 14 DAY SENSOR]  2    Sig: USE 1 KIT EVERY 14 (FOURTEEN) DAYS     Endocrinology: Diabetes - Testing Supplies Passed - 12/01/2022  9:18 PM      Passed - Valid encounter within last 12 months    Recent Outpatient Visits           1 month ago LUQ abdominal pain   [place], [place] T, NP   1 month ago LUQ abdominal pain   [place], [place] T, NP   2 months ago Type 2 diabetes mellitus with hyperglycemia, with long-term current use of insulin ([place])   [place], [place] T, NP   3 months ago LUQ abdominal pain   [place], [place] T, NP   4 months ago LUQ abdominal pain   [place], Henrine Screws T, NP       Future Appointments             Today Croitoru, Mihai, MD [place]. [place]   In 4 days Venita Lick, NP [employer], [facility]   In 8 months Ralene Bathe, MD [place]             valACYclovir (VALTREX) 1000 MG tablet [Pharmacy Med Name: VALACYCLOVIR HCL 1 GRAM TABLET] 7 tablet 6    Sig: TAKE 1 TABLET (1,000 MG TOTAL) BY MOUTH DAILY FOR 7 DAYS.     Antimicrobials:  Antiviral Agents - Anti-Herpetic Passed - 12/01/2022  9:18 PM      Passed - Valid encounter within last 12 months    Recent Outpatient Visits           1 month ago LUQ abdominal pain   [place], [place] T, NP   1 month ago LUQ abdominal pain   [place], [place] T, NP   2 months ago Type 2 diabetes mellitus with hyperglycemia, with long-term current use of insulin ([place])   [place], [place] T, NP   3 months ago LUQ abdominal pain   [place], [place] T, NP   4 months ago LUQ abdominal pain   [place], Henrine Screws T, NP       Future Appointments             Today Croitoru, Mihai, MD [place]. [place]   In 4 days Venita Lick, NP [employer], [facility]   In 8 months Nehemiah Massed Monia Sabal, MD [place]

## 2022-12-06 NOTE — Patient Instructions (Addendum)
Basaglar go up to 10 units for 3 days and if sugars fasting in morning are higher then 130, go up to 13 units for 3 days, if sugars in morning continue to be higher then 130 in morning then go up to 16 units.    Diabetes Mellitus Basics  Diabetes mellitus, or diabetes, is a long-term (chronic) disease. It occurs when the body does not properly use sugar (glucose) that is released from food after you eat. Diabetes mellitus may be caused by one or both of these problems: Your pancreas does not make enough of a hormone called insulin. Your body does not react in a normal way to the insulin that it makes. Insulin lets glucose enter cells in your body. This gives you energy. If you have diabetes, glucose cannot get into cells. This causes high blood glucose (hyperglycemia). How to treat and manage diabetes You may need to take insulin or other diabetes medicines daily to keep your glucose in balance. If you are prescribed insulin, you will learn how to give yourself insulin by injection. You may need to adjust the amount of insulin you take based on the foods that you eat. You will need to check your blood glucose levels using a glucose monitor as told by your health care provider. The readings can help determine if you have low or high blood glucose. Generally, you should have these blood glucose levels: Before meals (preprandial): 80-130 mg/dL (4.4-7.2 mmol/L). After meals (postprandial): below 180 mg/dL (10 mmol/L). Hemoglobin A1c (HbA1c) level: less than 7%. Your health care provider will set treatment goals for you. Keep all follow-up visits. This is important. Follow these instructions at home: Diabetes medicines Take your diabetes medicines every day as told by your health care provider. List your diabetes medicines here: Name of medicine: ______________________________ Amount (dose): _______________ Time (a.m./p.m.): _______________ Notes: ___________________________________ Name of  medicine: ______________________________ Amount (dose): _______________ Time (a.m./p.m.): _______________ Notes: ___________________________________ Name of medicine: ______________________________ Amount (dose): _______________ Time (a.m./p.m.): _______________ Notes: ___________________________________ Insulin If you use insulin, list the types of insulin you use here: Insulin type: ______________________________ Amount (dose): _______________ Time (a.m./p.m.): _______________Notes: ___________________________________ Insulin type: ______________________________ Amount (dose): _______________ Time (a.m./p.m.): _______________ Notes: ___________________________________ Insulin type: ______________________________ Amount (dose): _______________ Time (a.m./p.m.): _______________ Notes: ___________________________________ Insulin type: ______________________________ Amount (dose): _______________ Time (a.m./p.m.): _______________ Notes: ___________________________________ Insulin type: ______________________________ Amount (dose): _______________ Time (a.m./p.m.): _______________ Notes: ___________________________________ Managing blood glucose  Check your blood glucose levels using a glucose monitor as told by your health care provider. Write down the times that you check your glucose levels here: Time: _______________ Notes: ___________________________________ Time: _______________ Notes: ___________________________________ Time: _______________ Notes: ___________________________________ Time: _______________ Notes: ___________________________________ Time: _______________ Notes: ___________________________________ Time: _______________ Notes: ___________________________________  Low blood glucose Low blood glucose (hypoglycemia) is when glucose is at or below 70 mg/dL (3.9 mmol/L). Symptoms may include: Feeling: Hungry. Sweaty and clammy. Irritable or easily  upset. Dizzy. Sleepy. Having: A fast heartbeat. A headache. A change in your vision. Numbness around the mouth, lips, or tongue. Having trouble with: Moving (coordination). Sleeping. Treating low blood glucose To treat low blood glucose, eat or drink something containing sugar right away. If you can think clearly and swallow safely, follow the 15:15 rule: Take 15 grams of a fast-acting carb (carbohydrate), as told by your health care provider. Some fast-acting carbs are: Glucose tablets: take 3-4 tablets. Hard candy: eat 3-5 pieces. Fruit juice: drink 4 oz (120 mL). Regular (not diet) soda: drink 4-6 oz (120-180 mL). Honey or sugar:  eat 1 Tbsp (15 mL). Check your blood glucose levels 15 minutes after you take the carb. If your glucose is still at or below 70 mg/dL (3.9 mmol/L), take 15 grams of a carb again. If your glucose does not go above 70 mg/dL (3.9 mmol/L) after 3 tries, get help right away. After your glucose goes back to normal, eat a meal or a snack within 1 hour. Treating very low blood glucose If your glucose is at or below 54 mg/dL (3 mmol/L), you have very low blood glucose (severe hypoglycemia). This is an emergency. Do not wait to see if the symptoms will go away. Get medical help right away. Call your local emergency services (911 in the [place] Do not drive yourself to the hospital. Questions to ask your health care provider Should I talk with a diabetes educator? What equipment will I need to care for myself at home? What diabetes medicines do I need? When should I take them? How often do I need to check my blood glucose levels? What number can I call if I have questions? When is my follow-up visit? Where can I find a support group for people with diabetes? Where to find more information American Diabetes Association: www.diabetes.org Association of Diabetes Care and Education Specialists: www.diabeteseducator.org Contact a health care provider if: Your blood  glucose is at or above 240 mg/dL (13.3 mmol/L) for 2 days in a row. You have been sick or have had a fever for 2 days or more, and you are not getting better. You have any of these problems for more than 6 hours: You cannot eat or drink. You feel nauseous. You vomit. You have diarrhea. Get help right away if: Your blood glucose is lower than 54 mg/dL (3 mmol/L). You get confused. You have trouble thinking clearly. You have trouble breathing. These symptoms may represent a serious problem that is an emergency. Do not wait to see if the symptoms will go away. Get medical help right away. Call your local emergency services (911 in the [place] Do not drive yourself to the hospital. Summary Diabetes mellitus is a chronic disease that occurs when the body does not properly use sugar (glucose) that is released from food after you eat. Take insulin and diabetes medicines as told. Check your blood glucose every day, as often as told. Keep all follow-up visits. This is important. This information is not intended to replace advice given to you by your health care provider. Make sure you discuss any questions you have with your health care provider. Document Revised: 03/16/2020 Document Reviewed: 03/16/2020 Elsevier Patient Education  [place].

## 2022-12-08 ENCOUNTER — Ambulatory Visit (INDEPENDENT_AMBULATORY_CARE_PROVIDER_SITE_OTHER): Payer: Medicare Other | Admitting: Nurse Practitioner

## 2022-12-08 ENCOUNTER — Encounter: Payer: Self-pay | Admitting: Nurse Practitioner

## 2022-12-08 VITALS — BP 136/74 | HR 55 | Temp 98.0°F | Ht 65.0 in | Wt 152.6 lb

## 2022-12-08 DIAGNOSIS — E538 Deficiency of other specified B group vitamins: Secondary | ICD-10-CM

## 2022-12-08 DIAGNOSIS — I700 Atherosclerosis of aorta: Secondary | ICD-10-CM | POA: Diagnosis not present

## 2022-12-08 DIAGNOSIS — F5101 Primary insomnia: Secondary | ICD-10-CM

## 2022-12-08 DIAGNOSIS — I152 Hypertension secondary to endocrine disorders: Secondary | ICD-10-CM | POA: Diagnosis not present

## 2022-12-08 DIAGNOSIS — F339 Major depressive disorder, recurrent, unspecified: Secondary | ICD-10-CM | POA: Diagnosis not present

## 2022-12-08 DIAGNOSIS — I25708 Atherosclerosis of coronary artery bypass graft(s), unspecified, with other forms of angina pectoris: Secondary | ICD-10-CM

## 2022-12-08 DIAGNOSIS — N183 Chronic kidney disease, stage 3 unspecified: Secondary | ICD-10-CM

## 2022-12-08 DIAGNOSIS — E1122 Type 2 diabetes mellitus with diabetic chronic kidney disease: Secondary | ICD-10-CM

## 2022-12-08 DIAGNOSIS — E785 Hyperlipidemia, unspecified: Secondary | ICD-10-CM

## 2022-12-08 DIAGNOSIS — Z794 Long term (current) use of insulin: Secondary | ICD-10-CM

## 2022-12-08 DIAGNOSIS — R748 Abnormal levels of other serum enzymes: Secondary | ICD-10-CM | POA: Diagnosis not present

## 2022-12-08 DIAGNOSIS — K219 Gastro-esophageal reflux disease without esophagitis: Secondary | ICD-10-CM | POA: Diagnosis not present

## 2022-12-08 DIAGNOSIS — F413 Other mixed anxiety disorders: Secondary | ICD-10-CM | POA: Diagnosis not present

## 2022-12-08 DIAGNOSIS — E1165 Type 2 diabetes mellitus with hyperglycemia: Secondary | ICD-10-CM | POA: Diagnosis not present

## 2022-12-08 DIAGNOSIS — E1169 Type 2 diabetes mellitus with other specified complication: Secondary | ICD-10-CM | POA: Diagnosis not present

## 2022-12-08 DIAGNOSIS — Z79899 Other long term (current) drug therapy: Secondary | ICD-10-CM

## 2022-12-08 DIAGNOSIS — G2581 Restless legs syndrome: Secondary | ICD-10-CM

## 2022-12-08 DIAGNOSIS — E1159 Type 2 diabetes mellitus with other circulatory complications: Secondary | ICD-10-CM | POA: Diagnosis not present

## 2022-12-08 DIAGNOSIS — I771 Stricture of artery: Secondary | ICD-10-CM

## 2022-12-08 DIAGNOSIS — M353 Polymyalgia rheumatica: Secondary | ICD-10-CM

## 2022-12-08 LAB — MICROALBUMIN, URINE WAIVED
Creatinine, Urine Waived: 200 mg/dL (ref 10–300)
Microalb, Ur Waived: 30 mg/L — ABNORMAL HIGH (ref 0–19)
Microalb/Creat Ratio: <30 mg/g (ref <30)

## 2022-12-08 LAB — BAYER DCA HB A1C WAIVED: HB A1C (BAYER DCA - WAIVED): 8.6 % — ABNORMAL HIGH (ref 4.8–5.6)

## 2022-12-08 MED ORDER — SERTRALINE HCL 100 MG PO TABS: ORAL_TABLET | ORAL | 4 refills | Status: DC

## 2022-12-08 MED ORDER — LOSARTAN POTASSIUM 100 MG PO TABS: 100.0000 mg | ORAL_TABLET | Freq: Every day | ORAL | 4 refills | Status: DC

## 2022-12-08 MED ORDER — ATORVASTATIN CALCIUM 40 MG PO TABS: ORAL_TABLET | ORAL | 4 refills | Status: DC

## 2022-12-08 MED ORDER — CLOPIDOGREL BISULFATE 75 MG PO TABS: 75.0000 mg | ORAL_TABLET | Freq: Every day | ORAL | 4 refills | Status: DC

## 2022-12-08 MED ORDER — BASAGLAR KWIKPEN 100 UNIT/ML ~~LOC~~ SOPN: 10.0000 [IU] | PEN_INJECTOR | Freq: Every day | SUBCUTANEOUS | 4 refills | Status: DC

## 2022-12-08 MED ORDER — GABAPENTIN (ONCE-DAILY) 300 MG PO TABS: 300.0000 mg | ORAL_TABLET | Freq: Every day | ORAL | 4 refills | Status: DC

## 2022-12-08 MED ORDER — HYDROCHLOROTHIAZIDE 12.5 MG PO CAPS: ORAL_CAPSULE | ORAL | 4 refills | Status: DC

## 2022-12-08 MED ORDER — METFORMIN HCL 1000 MG PO TABS: 500.0000 mg | ORAL_TABLET | Freq: Two times a day (BID) | ORAL | 4 refills | Status: DC

## 2022-12-08 MED ORDER — AMLODIPINE BESYLATE 2.5 MG PO TABS: 2.5000 mg | ORAL_TABLET | Freq: Every day | ORAL | 4 refills | Status: DC

## 2022-12-08 NOTE — Assessment & Plan Note (Signed)
Chronic, ongoing.  Continue Protonix and adjust as needed.  Mag level today.  Risks of PPI use were discussed with patient including bone loss, C. Diff diarrhea, pneumonia, infections, CKD, electrolyte abnormalities.  Verbalizes understanding and chooses to continue the medication.

## 2022-12-08 NOTE — Assessment & Plan Note (Signed)
Chronic, ongoing.  Denies SI/HI.  Continue Sertraline 150 MG daily as is ordered. This is offering benefit to mood overall and may help reduce benzo use.  Consider increase to 200 MG in future for increased benefit.  Return in 3 months.

## 2022-12-08 NOTE — Assessment & Plan Note (Signed)
Ongoing, continue supplement and recheck level every 6 months.

## 2022-12-08 NOTE — Assessment & Plan Note (Signed)
Chronic, ongoing with use of Lorazepam nightly.  Has had trial reductions in past and other medications without success.  Is fully aware of risks of chronic benzo use and aware this will be discussed each visit.  Return in 3 months.  UDS up to date (05/31/22), contract up to date.

## 2022-12-08 NOTE — Assessment & Plan Note (Signed)
Chronic, ongoing.  Continue collaboration with rheumatology and current medication regimen.  Recent notes reviewed.

## 2022-12-08 NOTE — Assessment & Plan Note (Signed)
Chronic, stable with Gabapentin, continue this regimen and adjust as needed. Renal dose as needed.

## 2022-12-08 NOTE — Assessment & Plan Note (Signed)
Stable, continue collaboration with cardiology and preventative treatment to include ASA, Plavix, and statin.

## 2022-12-08 NOTE — Progress Notes (Signed)
BP 136/74 (BP Location: Left Arm)   Pulse (!) 55   Temp 98 F (36.7 C) (Oral)   Ht '5\' 5"'$  (1.651 m)   Wt 152 lb 9.6 oz (69.2 kg)   SpO2 97%   BMI 25.39 kg/m    Subjective:    Patient ID: Laura Rojas, female    DOB: 12-02-1945, 77 y.o.   MRN: [id number]  HPI: Laura Rojas is a 77 y.o. female  Chief Complaint  Patient presents with   Diabetes   Hypertension   Hyperlipidemia   Chronic Kidney Disease   Mood   RA   DIABETES A1c 7.7% October 2023. She continues on Basaglar, Novolog sliding scale, and Metformin.  Followed by endocrinology in past, last saw NP [facility] 05/16/21, has not returned as NP no longer at the practice and she prefers to stay with PCP at this time.  Does endorse poor diet over holidays.  Had to stop Januvia recently due to pancreatitis and elevation in Lipase and Amylase.  Had been going to gym, but quit during recent acute abdomen pain, plans to restart walking program.  Did indulge over the holidays.     She did not tolerate GLP1 (Trulicity) in past and had side effects with [place].  Follows with podiatry and they are considering surgery to right foot.   Hypoglycemic episodes: a few in low 60's Polydipsia/polyuria: no Visual disturbance: no Chest pain: no Paresthesias: no Glucose Monitoring: yes             Accucheck frequency: Freestyle in place -- 60 to 400 since getting rid of Januvia             Fasting glucose:              Post prandial:             Evening:              Before meals: Taking Insulin?: yes             Long acting insulin: 8 units often at night             Short acting insulin: sliding scale units with meals (20 to 22 units) Blood Pressure Monitoring: rarely Retinal Examination: Up to Date -- had one last week [place] [facility] Exam: Up to Date Pneumovax: Up to Date Influenza: Up to Date Aspirin: yes   HYPERTENSION / HYPERLIPIDEMIA/CAD Dr. Sallyanne Kuster, last saw 12/04/22, sees yearly. She is followed due to subclavian  artery stenosis and angina. Continues on Amlodipine, Plavix, HCTZ, Fish oil, Losartan, Lipitor.  Satisfied with current treatment? yes Duration of hypertension: chronic BP monitoring frequency: not checking BP range:  BP medication side effects: no Duration of hyperlipidemia: chronic Cholesterol medication side effects: no Cholesterol supplements: fish oil Medication compliance: good compliance Aspirin: yes Recent stressors: no Recurrent headaches: no Visual changes: no Palpitations: no Dyspnea: no Chest pain: no Lower extremity edema: no Dizzy/lightheaded: no   CHRONIC KIDNEY DISEASE Stable on recent labs. CKD status: stable Medications renally dose: yes Previous renal evaluation: no Pneumovax:  Up to Date Influenza Vaccine:  Up to Date    POLYMYALGIA RHEUMATICA & RLS Rheumatology follows, diagnosis of polymyalgia rheumatica syndrome. Saw last 10/26/22, getting injections at this time -- these have helped with pain.    Continues on Gabapentin for RLS at bedtime only. Duration: chronic Involved joints: right hand, both feet, and left knee Mechanism of injury: arthritis Severity: 0/10 today Quality:  dull, aching  and burning Frequency: intermittent  Radiation: no Aggravating factors: walking and movement  Alleviating factors: Aspercreme and APAP  Status: stable Treatments attempted: Tylenol and Aspercreme Relief with NSAIDs?:  No NSAIDs Taken Weakness with weight bearing or walking: no Sensation of giving way: no Locking: no Popping: no Bruising: no Swelling: no Redness: no Paresthesias/decreased sensation: no Fevers: no  GERD Continues on Protonix.  GERD control status: stable Satisfied with current treatment? yes Heartburn frequency:  Medication side effects: no  Dysphagia: no Odynophagia:  no Hematemesis: no Blood in stool: no EGD: yes    ANXIETY/STRESS Continues Zoloft 150 MG daily.  Continues Ativan 0.5 MG, takes this at night only, although  ordered BID -- currently reports she takes 1 MG at night. Does not take extra during day.  Pt aware of risks of benzo medication use to include increased sedation, respiratory suppression, falls, dependence and cardiovascular events.  Pt would like to continue treatment as benefit determined to outweigh risk. On review PDMP last Ativan fill 11/28/22 for 3 months supply, previous PCP ordered this way. Duration:stable Anxious mood: no Excessive worrying: no Irritability: no  Sweating: no Nausea: no Palpitations:no Hyperventilation: no Panic attacks: no Agoraphobia: no  Obscessions/compulsions: no Depressed mood: no    12/08/2022   11:07 AM 10/16/2022    3:30 PM 09/07/2022    9:24 AM 09/04/2022    8:53 AM 05/31/2022    9:08 AM  Depression screen PHQ 2/9  Decreased Interest 0 0 0 0 0  Down, Depressed, Hopeless 0 0 0 0 0  PHQ - 2 Score 0 0 0 0 0  Altered sleeping '1 1 1 '$ 0 2  Tired, decreased energy 0 1 0 0 1  Change in appetite 0 0 0 0 2  Feeling bad or failure about yourself  0 0 0 0 0  Trouble concentrating 0 0 0 0 0  Moving slowly or fidgety/restless 0 0 0 0 0  Suicidal thoughts 0 0 0 0 0  PHQ-9 Score '1 2 1 '$ 0 5  Difficult doing work/chores Not difficult at all Not difficult at all Not difficult at all Not difficult at all Not difficult at all      12/08/2022   11:08 AM 10/16/2022    3:30 PM 09/07/2022    9:24 AM 05/31/2022    9:08 AM  GAD 7 : Generalized Anxiety Score  Nervous, Anxious, on Edge 0 0 0 0  Control/stop worrying 0 0 0 0  Worry too much - different things 0 0 0 0  Trouble relaxing 0 0 0 0  Restless 0 0 0 0  Easily annoyed or irritable 0 0 0 0  Afraid - awful might happen 0 0 0 0  Total GAD 7 Score 0 0 0 0  Anxiety Difficulty Not difficult at all Not difficult at all Not difficult at all Not difficult at all      Relevant past medical, surgical, family and social history reviewed and updated as indicated. Interim medical history since our last visit  reviewed. Allergies and medications reviewed and updated.  Review of Systems  Per HPI unless specifically indicated above     Objective:    BP 136/74 (BP Location: Left Arm)   Pulse (!) 55   Temp 98 F (36.7 C) (Oral)   Ht '5\' 5"'$  (1.651 m)   Wt 152 lb 9.6 oz (69.2 kg)   SpO2 97%   BMI 25.39 kg/m   Wt Readings from Last 3 Encounters:  12/08/22  152 lb 9.6 oz (69.2 kg)  12/04/22 153 lb 9.6 oz (69.7 kg)  10/16/22 154 lb (69.9 kg)    Physical Exam Vitals and nursing note reviewed.  Constitutional:      General: She is awake. She is not in acute distress.    Appearance: She is well-developed, well-groomed and overweight. She is not ill-appearing.  HENT:     Head: Normocephalic.     Right Ear: Hearing normal.     Left Ear: Hearing normal.  Eyes:     General: Lids are normal.        Right eye: No discharge.        Left eye: No discharge.     Conjunctiva/sclera: Conjunctivae normal.     Pupils: Pupils are equal, round, and reactive to light.  Neck:     Thyroid: No thyromegaly.     Vascular: Carotid bruit (bilateral) present.  Cardiovascular:     Rate and Rhythm: Normal rate and regular rhythm.     Heart sounds: Normal heart sounds. No murmur heard.    No gallop.  Pulmonary:     Effort: Pulmonary effort is normal. No accessory muscle usage or respiratory distress.     Breath sounds: No wheezing or rhonchi.  Abdominal:     General: Bowel sounds are normal.     Palpations: Abdomen is soft.  Musculoskeletal:     Cervical back: Normal range of motion and neck supple.     Right lower leg: No edema.     Left lower leg: No edema.  Skin:    General: Skin is warm and dry.  Neurological:     Mental Status: She is alert and oriented to person, place, and time.  Psychiatric:        Attention and Perception: Attention normal.        Mood and Affect: Mood normal.        Speech: Speech normal.        Behavior: Behavior normal. Behavior is cooperative.        Thought Content:  Thought content normal.    Results for orders placed or performed in visit on 12/08/22  Bayer DCA Hb A1c Waived  Result Value Ref Range   HB A1C (BAYER DCA - WAIVED) 8.6 (H) 4.8 - 5.6 %  Microalbumin, Urine Waived  Result Value Ref Range   Microalb, Ur Waived 30 (H) 0 - 19 mg/L   Creatinine, Urine Waived 200 10 - 300 mg/dL   Microalb/Creat Ratio <30 <30 mg/g      Assessment & Plan:   Problem List Items Addressed This Visit       Cardiovascular and Mediastinum   Aortic atherosclerosis (HCC)    Chronic, ongoing.  Noted on imaging CT 07/28/22.  Continue statin and Plavix therapy.      Relevant Medications   amLODipine (NORVASC) 2.5 MG tablet   atorvastatin (LIPITOR) 40 MG tablet   hydrochlorothiazide (MICROZIDE) 12.5 MG capsule   losartan (COZAAR) 100 MG tablet   Other Relevant Orders   Comprehensive metabolic panel   Lipid Panel w/o Chol/HDL Ratio   Coronary artery disease of bypass graft of native heart with stable angina pectoris ([place])    Continue collaboration with cardiology and preventative treatment, including statin and ASA.      Relevant Medications   amLODipine (NORVASC) 2.5 MG tablet   atorvastatin (LIPITOR) 40 MG tablet   clopidogrel (PLAVIX) 75 MG tablet   hydrochlorothiazide (MICROZIDE) 12.5 MG capsule   losartan (COZAAR)  100 MG tablet   sertraline (ZOLOFT) 100 MG tablet   Other Relevant Orders   Comprehensive metabolic panel   Lipid Panel w/o Chol/HDL Ratio   Hypertension associated with diabetes (HCC)    Chronic, stable.  BP at goal for age on recheck.  Continue current medication regimen and collaboration with cardiology. Labs: TSH, CMP.  Recommend she monitor BP at least three days a week at home and document for providers + bring to visits.  DASH diet focus.  Return in 3 months.      Relevant Medications   amLODipine (NORVASC) 2.5 MG tablet   atorvastatin (LIPITOR) 40 MG tablet   Insulin Glargine (BASAGLAR KWIKPEN) 100 UNIT/ML   hydrochlorothiazide  (MICROZIDE) 12.5 MG capsule   metFORMIN (GLUCOPHAGE) 1000 MG tablet   losartan (COZAAR) 100 MG tablet   Other Relevant Orders   Bayer DCA Hb A1c Waived (Completed)   Microalbumin, Urine Waived (Completed)   Comprehensive metabolic panel   TSH   Subclavian artery stenosis, left (HCC)    Stable, continue collaboration with cardiology and preventative treatment to include ASA, Plavix, and statin.      Relevant Medications   amLODipine (NORVASC) 2.5 MG tablet   atorvastatin (LIPITOR) 40 MG tablet   hydrochlorothiazide (MICROZIDE) 12.5 MG capsule   losartan (COZAAR) 100 MG tablet     Digestive   Gastroesophageal reflux disease without esophagitis    Chronic, ongoing.  Continue Protonix and adjust as needed.  Mag level today.  Risks of PPI use were discussed with patient including bone loss, C. Diff diarrhea, pneumonia, infections, CKD, electrolyte abnormalities.  Verbalizes understanding and chooses to continue the medication.      Relevant Orders   Magnesium     Endocrine   CKD stage 3 due to type 2 diabetes mellitus (HCC)    Chronic, stable Stage 3a.  Continue Losartan for kidney protection.  Urine ALB 26 December 2022. Continue collaboration with cardiology.  Consider nephrology referral if worsening function.  Labs: CMP.      Relevant Medications   atorvastatin (LIPITOR) 40 MG tablet   Insulin Glargine (BASAGLAR KWIKPEN) 100 UNIT/ML   metFORMIN (GLUCOPHAGE) 1000 MG tablet   losartan (COZAAR) 100 MG tablet   Other Relevant Orders   Microalbumin, Urine Waived (Completed)   Comprehensive metabolic panel   Hyperlipidemia associated with type 2 diabetes mellitus (HCC)    Chronic, ongoing.  Continue current medication regimen and adjust as needed.  Lipid panel today.  Discussed with patient, if myalgias with Atorvastatin, + her history of myalgia with statins, could consider Repatha initiation if poor tolerance.      Relevant Medications   amLODipine (NORVASC) 2.5 MG tablet    atorvastatin (LIPITOR) 40 MG tablet   Insulin Glargine (BASAGLAR KWIKPEN) 100 UNIT/ML   hydrochlorothiazide (MICROZIDE) 12.5 MG capsule   metFORMIN (GLUCOPHAGE) 1000 MG tablet   losartan (COZAAR) 100 MG tablet   Other Relevant Orders   Bayer DCA Hb A1c Waived (Completed)   Comprehensive metabolic panel   Lipid Panel w/o Chol/HDL Ratio   Type 2 diabetes mellitus with hyperglycemia, with long-term current use of insulin (HCC) - Primary    Chronic, ongoing, followed by endocrinology in past with A1c 8.6% today, trend up due to loss of Januvia and holiday diet, but remaining well below her previous levels in 9-10 range.  She wishes to avoid return to endo at this time as her previous provider left.  Will continue Basaglar but increase slightly to 10 units (wrote instructions  down for her on this and explained at length) + cut back on her sliding scale with this if needed, continue Metformin.  Continue collaboration with CCM team in office.   Bring Libre to next visit.  Return to office in 3 months -- goal to avoid hypoglycemia.  If ongoing poor control will get back into endo. - Eye and foot exam up to date - [place] on board.  ARB on board.      Relevant Medications   atorvastatin (LIPITOR) 40 MG tablet   Insulin Glargine (BASAGLAR KWIKPEN) 100 UNIT/ML   metFORMIN (GLUCOPHAGE) 1000 MG tablet   losartan (COZAAR) 100 MG tablet   Other Relevant Orders   Bayer DCA Hb A1c Waived (Completed)   Microalbumin, Urine Waived (Completed)   Comprehensive metabolic panel     Other   Anxiety disorder    Chronic, ongoing.  Denies SI/HI.  Continue Sertraline and Ativan.  Has had multiple attempts at reduction of benzo with no success.  Discussed at length risks with her and she is aware.  Continue Ativan and fill upon request.  PDMP reviewed.  Return in 3 months, UDS up to date (05/31/22) and contract on file.      Relevant Medications   sertraline (ZOLOFT) 100 MG tablet   B12 deficiency    Ongoing,  continue supplement and recheck level every 6 months.      Relevant Orders   Vitamin B12   Depression, recurrent (HCC)    Chronic, ongoing.  Denies SI/HI.  Continue Sertraline 150 MG daily as is ordered. This is offering benefit to mood overall and may help reduce benzo use.  Consider increase to 200 MG in future for increased benefit.  Return in 3 months.      Relevant Medications   sertraline (ZOLOFT) 100 MG tablet   Elevated amylase and lipase    Recent levels improving, recheck today.  She no longer has abdominal pain.      Relevant Orders   Amylase   Lipase   Insomnia    Chronic, ongoing with use of Lorazepam nightly.  Has had trial reductions in past and other medications without success.  Is fully aware of risks of chronic benzo use and aware this will be discussed each visit.  Return in 3 months.  UDS up to date (05/31/22), contract up to date.      Long-term current use of benzodiazepine    Chronic, ongoing for insomnia.  Has had trials of other medications and reductions with poor outcomes.  Fully aware of risks of chronic benzo use and aware this will be discussed each visit.  UDS up to date and contract on file.      Polymyalgia rheumatica (HCC)    Chronic, ongoing.  Continue collaboration with rheumatology and current medication regimen.  Recent notes reviewed.      Restless leg syndrome    Chronic, stable with Gabapentin, continue this regimen and adjust as needed. Renal dose as needed.        Follow up plan: Return in about 3 months (around 03/09/2023) for T2DM, HTN/HLD, MOOD, CKD.

## 2022-12-08 NOTE — Assessment & Plan Note (Signed)
Chronic, ongoing.  Denies SI/HI.  Continue Sertraline and Ativan.  Has had multiple attempts at reduction of benzo with no success.  Discussed at length risks with her and she is aware.  Continue Ativan and fill upon request.  PDMP reviewed.  Return in 3 months, UDS up to date (05/31/22) and contract on file.

## 2022-12-08 NOTE — Assessment & Plan Note (Addendum)
Chronic, ongoing, followed by endocrinology in past with A1c 8.6% today, trend up due to loss of Januvia and holiday diet, but remaining well below her previous levels in 9-10 range.  She wishes to avoid return to endo at this time as her previous provider left.  Will continue Basaglar but increase slightly to 10 units (wrote instructions down for her on this and explained at length) + cut back on her sliding scale with this if needed, continue Metformin.  Continue collaboration with CCM team in office.   Bring Libre to next visit.  Return to office in 3 months -- goal to avoid hypoglycemia.  If ongoing poor control will get back into endo. - Eye and foot exam up to date - Statin on board.  ARB on board.

## 2022-12-08 NOTE — Assessment & Plan Note (Signed)
Chronic, stable.  BP at goal for age on recheck.  Continue current medication regimen and collaboration with cardiology. Labs: TSH, CMP.  Recommend she monitor BP at least three days a week at home and document for providers + bring to visits.  DASH diet focus.  Return in 3 months.

## 2022-12-08 NOTE — Assessment & Plan Note (Signed)
Chronic, ongoing.  Continue current medication regimen and adjust as needed.  Lipid panel today.  Discussed with patient, if myalgias with Atorvastatin, + her history of myalgia with statins, could consider Repatha initiation if poor tolerance.

## 2022-12-08 NOTE — Assessment & Plan Note (Signed)
Continue collaboration with cardiology and preventative treatment, including statin and ASA.

## 2022-12-08 NOTE — Assessment & Plan Note (Signed)
Chronic, ongoing for insomnia.  Has had trials of other medications and reductions with poor outcomes.  Fully aware of risks of chronic benzo use and aware this will be discussed each visit.  UDS up to date and contract on file.

## 2022-12-08 NOTE — Assessment & Plan Note (Addendum)
Chronic, stable Stage 3a.  Continue Losartan for kidney protection.  Urine ALB 26 December 2022. Continue collaboration with cardiology.  Consider nephrology referral if worsening function.  Labs: CMP.

## 2022-12-08 NOTE — Assessment & Plan Note (Signed)
Recent levels improving, recheck today.  She no longer has abdominal pain.

## 2022-12-08 NOTE — Assessment & Plan Note (Signed)
Chronic, ongoing.  Noted on imaging CT 07/28/22.  Continue statin and Plavix therapy.

## 2022-12-09 LAB — AMYLASE: Amylase: 145 U/L — ABNORMAL HIGH (ref 31–110)

## 2022-12-09 LAB — COMPREHENSIVE METABOLIC PANEL
ALT: 17 IU/L (ref 0–32)
AST: 20 IU/L (ref 0–40)
Albumin/Globulin Ratio: 2.2 (ref 1.2–2.2)
Albumin: 4.4 g/dL (ref 3.8–4.8)
Alkaline Phosphatase: 144 IU/L — ABNORMAL HIGH (ref 44–121)
BUN/Creatinine Ratio: 14 (ref 12–28)
BUN: 16 mg/dL (ref 8–27)
Bilirubin Total: 0.4 mg/dL (ref 0.0–1.2)
CO2: 26 mmol/L (ref 20–29)
Calcium: 9.8 mg/dL (ref 8.7–10.3)
Chloride: 104 mmol/L (ref 96–106)
Creatinine, Ser: 1.14 mg/dL — ABNORMAL HIGH (ref 0.57–1.00)
Globulin, Total: 2 g/dL (ref 1.5–4.5)
Glucose: 126 mg/dL — ABNORMAL HIGH (ref 70–99)
Potassium: 4.2 mmol/L (ref 3.5–5.2)
Sodium: 142 mmol/L (ref 134–144)
Total Protein: 6.4 g/dL (ref 6.0–8.5)
eGFR: 50 mL/min/{1.73_m2} — ABNORMAL LOW (ref >59)

## 2022-12-09 LAB — LIPASE: Lipase: 359 U/L — ABNORMAL HIGH (ref 14–85)

## 2022-12-09 LAB — LIPID PANEL W/O CHOL/HDL RATIO
Cholesterol, Total: 161 mg/dL (ref 100–199)
HDL: 63 mg/dL (ref >39)
LDL Chol Calc (NIH): 74 mg/dL (ref 0–99)
Triglycerides: 142 mg/dL (ref 0–149)
VLDL Cholesterol Cal: 24 mg/dL (ref 5–40)

## 2022-12-09 LAB — VITAMIN B12: Vitamin B-12: 1593 pg/mL — ABNORMAL HIGH (ref 232–1245)

## 2022-12-09 LAB — TSH: TSH: 1.78 u[IU]/mL (ref 0.450–4.500)

## 2022-12-09 LAB — MAGNESIUM: Magnesium: 1.8 mg/dL (ref 1.6–2.3)

## 2022-12-10 NOTE — Progress Notes (Signed)
Contacted via [place] -- however please call and ensure she received below message as I want her to maintain her GI appointment scheduled: Good morning Laura Rojas, your labs have returned: - Kidney function continues to show some chronic kidney disease stage 3a with no worsening which we will continue to monitor.  Liver function, AST and ALT, remains stable, however alkaline phosphatase remains elevated (often this is related to gall bladder which we know yours has stones present).   - Amylase and Lipase have trended up again, pancreas labs -- I do want you to maintain your GI appointment that is upcoming as I am concerned this may be related to gall bladder, but would like them to further assess. - Remainder of labs are stable.  Any questions? Keep being amazing!!  Thank you for allowing me to participate in your care.  I appreciate you. Kindest regards, Ellene Bloodsaw

## 2022-12-11 ENCOUNTER — Telehealth: Payer: Self-pay

## 2022-12-11 NOTE — Telephone Encounter (Signed)
PA for Gralise 300 ,mg tablets has been denied

## 2022-12-11 NOTE — Telephone Encounter (Signed)
PA started for Gralise '300mg'$   through Covermy meds. Awaiting on determination

## 2022-12-12 ENCOUNTER — Other Ambulatory Visit: Payer: Self-pay | Admitting: Internal Medicine

## 2022-12-12 DIAGNOSIS — R1012 Left upper quadrant pain: Secondary | ICD-10-CM

## 2022-12-12 DIAGNOSIS — R748 Abnormal levels of other serum enzymes: Secondary | ICD-10-CM

## 2022-12-21 ENCOUNTER — Ambulatory Visit
Admission: RE | Admit: 2022-12-21 | Discharge: 2022-12-21 | Disposition: A | Discharge status: Home / Self Care | Payer: Medicare Other | Source: Ambulatory Visit | Attending: Internal Medicine | Admitting: Internal Medicine

## 2022-12-21 DIAGNOSIS — K5730 Diverticulosis of large intestine without perforation or abscess without bleeding: Secondary | ICD-10-CM | POA: Diagnosis not present

## 2022-12-21 DIAGNOSIS — R748 Abnormal levels of other serum enzymes: Secondary | ICD-10-CM

## 2022-12-21 DIAGNOSIS — R1012 Left upper quadrant pain: Secondary | ICD-10-CM | POA: Diagnosis not present

## 2022-12-21 DIAGNOSIS — N281 Cyst of kidney, acquired: Secondary | ICD-10-CM | POA: Diagnosis not present

## 2022-12-21 MED ORDER — IOHEXOL 300 MG/ML  SOLN
100.0000 mL | Freq: Once | INTRAMUSCULAR | Status: AC | PRN
  Administered 2022-12-21: 100 mL via INTRAVENOUS

## 2022-12-28 ENCOUNTER — Telehealth: Payer: Self-pay | Admitting: Nurse Practitioner

## 2022-12-28 MED ORDER — NOVOLOG FLEXPEN 100 UNIT/ML ~~LOC~~ SOPN: PEN_INJECTOR | SUBCUTANEOUS | 4 refills | Status: DC

## 2022-12-28 MED ORDER — BASAGLAR KWIKPEN 100 UNIT/ML ~~LOC~~ SOPN: 16.0000 [IU] | PEN_INJECTOR | Freq: Every day | SUBCUTANEOUS | 4 refills | Status: DC

## 2022-12-28 NOTE — Telephone Encounter (Signed)
Patient called and asked how much Novolog insulin is she taking. She says she's been taking 16 units TID for over a year now. I asked is she following the sliding scale for the 16 units, she says she doesn't know where it is, but that Jolene did give her a sliding scale to go by. She says she's using 16 units because of her blood sugars. She read the last box she has and it says filled on 08/30/22 #1 refill. I advised per the chart there should be 2 refills at the pharmacy, so [facility]. [facility] [place], [facility] who says the patient picked up a refill last on 11/13/22 and has no more refills. He says the insurance will not allow a refill until the end of February with the instructions of 10 units TID. So if the provider would send in a new script with the 16 units TID, they would be able to refill it and even if she's using less, the insurance will pay for it because it's within the time frame allotted. Advised I will send this to [facility] for new Rx if indicated.

## 2022-12-28 NOTE — Telephone Encounter (Signed)
Medication Refill - Medication:  NOVOLOG FLEXPEN 100 UNIT/ML FlexPen Pt was told too early to fill but she is using more units  as told by Dr Ned Card, that's why she is out  Has the patient contacted their pharmacy? yes (Agent: If no, request that the patient contact the pharmacy for the refill. If patient does not wish to contact the pharmacy document the reason why and proceed with request.) (Agent: If yes, when and what did the pharmacy advise?)contact pcp  Preferred Pharmacy (with phone number or street name):  [id number] [address] Phone: [phone] Fax: [phone]    Has the patient been seen for an appointment in the last year OR does the patient have an upcoming appointment? yes  Agent: Please be advised that RX refills may take up to 3 business days. We ask that you follow-up with your pharmacy.

## 2023-01-02 ENCOUNTER — Telehealth: Payer: Self-pay

## 2023-01-02 DIAGNOSIS — M0609 Rheumatoid arthritis without rheumatoid factor, multiple sites: Secondary | ICD-10-CM | POA: Diagnosis not present

## 2023-01-02 NOTE — Telephone Encounter (Signed)
PT returned our call regarding insulin dosing.  Please return her call.

## 2023-01-02 NOTE — Telephone Encounter (Signed)
Called patient and reiterated the directions for her Novolog flex pen sliding scale. Patient stated that she does have her AVS from last visit with instruction on it. Patient verbalized understanding

## 2023-01-02 NOTE — Telephone Encounter (Signed)
Have left patient several messages concerning how to take her insulin.

## 2023-01-05 ENCOUNTER — Telehealth: Payer: Self-pay

## 2023-01-05 NOTE — Progress Notes (Cosign Needed)
Care Management & Coordination Services Pharmacy Team  Reason for Encounter: Diabetes  Contacted patient to discuss diabetes disease state. Spoke with patient on 01/15/2023    Recent office visits:  12/08/22-Jolene T. Ned Card, NP (PCP) Seen for a general follow up visit. Labs ordered.Will continue Basaglar but increase slightly to 10 units (wrote instructions down for her on this and explained at length) + cut back on her sliding scale with this if needed. Follow up in 3 months.  10/16/22-Jolene T. Ned Card, NP (PCP) Seen for pancreatitis. Stop Januvia. Will continue Basaglar but increase slightly to 13 units (wrote instructions down for her on this) + cut back on her sliding scale with this if needed. Labs ordered. Return for as scheduled in January. 10/09/22-Jolene T. Cannady, NP (PCP) Seen for back pain and flank pain. Stop Januvia. Labs ordered. Follow up in 1 week. 09/07/22-Jolene T. Ned Card, NP (PCP) Seen for a general follow up visit. Labs ordered. Follow up in 3 months.  08/18/22-Jolene T. Ned Card, NP (PCP) Seen for pain. Start on Tizanidine 2 mg. Follow up in 2 weeks. 07/18/22-Jolene T. Ned Card, NP (PCP) Seen for abdominal pain. Labs ordered. Return if symptoms worsen or fail to improve.   Recent consult visits:  12/12/22-Teodoro Madolyn Frieze, MD (Gastroenterology) Seen for abdominal pain. Follow up in 2 months.  12/04/22-Mihai Croitoru, MD (Cardiology) Seen for a CAD follow up visit. Follow up in 1 year. 10/26/22-Mayur Stefan Church, MD (Rheumatology) Seen for rheumatoid arthritis. Follow up in 4 months.  08/24/22-Justin Georgina Pillion, DPM (Podiatry) Seen for toe pain. 08/16/22-David Isac Caddy, MD (Dermatology) Seen for annual exam. Start cerave cream daily. Follow up in 1 year.   Hospital visits:  None in previous 6 months  Medications: Outpatient Encounter Medications as of 01/05/2023  Medication Sig   amLODipine (NORVASC) 2.5 MG tablet Take 1 tablet (2.5 mg total) by mouth  daily.   aspirin 81 MG tablet Take 81 mg by mouth daily.   atorvastatin (LIPITOR) 40 MG tablet TAKE 1 TABLET BY MOUTH EVERY DAY** STOP THE SIMVASTATIN   BD PEN NEEDLE NANO U/F 32G X 4 MM MISC SMARTSIG:1 Each SUB-Q 4 Times Daily   certolizumab pegol (CIMZIA) 2 X 200 MG/ML PSKT Inject into the skin.   cholecalciferol (VITAMIN D3) 25 MCG (1000 UT) tablet Take 1,000 Units by mouth daily.   clopidogrel (PLAVIX) 75 MG tablet Take 1 tablet (75 mg total) by mouth daily.   Continuous Blood Gluc Sensor (FREESTYLE LIBRE 14 DAY SENSOR) MISC USE 1 KIT EVERY 14 (FOURTEEN) DAYS   Gabapentin, Once-Daily, 300 MG TABS Take 300 mg by mouth at bedtime.   hydrochlorothiazide (MICROZIDE) 12.5 MG capsule TAKE 1 CAPSULE BY MOUTH EVERY DAY   hydrOXYzine (ATARAX) 25 MG tablet Take 1 tablet by mouth at bedtime.   insulin aspart (NOVOLOG FLEXPEN) 100 UNIT/ML FlexPen INJECT 16 UNITS INTO THE SKIN 3 (THREE) TIMES DAILY. INJECT PER SLIDING SCALE REGIMEN.   Insulin Glargine (BASAGLAR KWIKPEN) 100 UNIT/ML Inject 16 Units into the skin at bedtime.   Insulin Pen Needle (BD PEN NEEDLE NANO U/F) 32G X 4 MM MISC Use 1 each 4 (four) times daily   Insulin Pen Needle (BD PEN NEEDLE NANO U/F) 32G X 4 MM MISC USE 1 EACH 4 (FOUR) TIMES DAILY   Iron-Vitamin C 65-125 MG TABS Take 1 tablet by mouth daily.   LORazepam (ATIVAN) 0.5 MG tablet TAKE 1 TAB BY MOUTH TWICE A DAY (USE THE ADDITIONAL 30 TABS ONLY SPARINGLY & ONLY FOR SEVERE ANXIETY)  losartan (COZAAR) 100 MG tablet Take 1 tablet (100 mg total) by mouth daily.   Magnesium 400 MG TABS Take 1 tablet by mouth daily.   metFORMIN (GLUCOPHAGE) 1000 MG tablet Take 0.5 tablets (500 mg total) by mouth 2 (two) times daily.   Multiple Vitamin (MULTIVITAMIN) tablet Take 1 tablet by mouth daily.   Omega-3 Fatty Acids (FISH OIL) 1200 MG CAPS Take 1,200 mg by mouth daily.   pantoprazole (PROTONIX) 40 MG tablet Take 1 tablet (40 mg total) by mouth daily.   sertraline (ZOLOFT) 100 MG tablet TAKE 1.5  TABLETS BY MOUTH DAILY   valACYclovir (VALTREX) 1000 MG tablet TAKE 1 TABLET (1,000 MG TOTAL) BY MOUTH DAILY FOR 7 DAYS.   vitamin B-12 (CYANOCOBALAMIN) 1000 MCG tablet Take 1,000 mcg by mouth daily.   No facility-administered encounter medications on file as of 01/05/2023.    Recent Relevant Labs: Lab Results  Component Value Date/Time   HGBA1C 8.6 (H) 12/08/2022 11:10 AM   HGBA1C 7.7 (H) 09/07/2022 09:10 AM   HGBA1C 8.2 12/31/2019 12:00 AM   HGBA1C 8.8% 09/24/2019 12:00 AM   MICROALBUR 30 (H) 12/08/2022 11:10 AM   MICROALBUR 10 02/21/2022 02:07 PM    Kidney Function Lab Results  Component Value Date/Time   CREATININE 1.14 (H) 12/08/2022 11:12 AM   CREATININE 1.08 (H) 10/16/2022 04:06 PM   GFRNONAA 56 (L) 01/11/2021 09:20 AM   GFRAA 64 01/11/2021 09:20 AM   Current antihyperglycemic regimen:  Novolog 100 units inject 16 units 3x daily Basaglar 100 units inject 16 units at bedtime Metformin 1000 mg take 0.5 tablet twice daily  Patient verbally confirms she is taking the above medications as directed. Yes  What diet changes have been made to improve diabetes control?     Patient states she has been eating about the same but is making an effort to eat healthy  What recent interventions/DTPs have been made to improve glycemic control:  12/08/22-Jolene T. Ned Card, NP (PCP) Seen for a general follow up visit. Labs ordered.Will continue Basaglar but increase slightly to 10 units  10/09/22-Jolene T. Cannady, NP (PCP) Seen for back pain and flank pain. Stop Januvia.   Have there been any recent hospitalizations or ED visits since last visit with PharmD? No  Patient reports hypoglycemic symptoms, including Shaky  Patient denies hyperglycemic symptoms, including blurry vision, excessive thirst, fatigue, polyuria, and weakness  How often are you checking your blood sugar? once daily  What are your blood sugars ranging?  Fasting: 178 Before meals:  After meals:  Bedtime:    During the week, how often does your blood glucose drop below 70? Once a week  Are you checking your feet daily/regularly? Yes  Adherence Review: Is the patient currently on a STATIN medication? Yes Is the patient currently on ACE/ARB medication? Yes Does the patient have >5 day gap between last estimated fill dates? No   Star Rating Drugs:  Atorvastatin 40 mg Last filled:12/17/22 90 DS, 09/22/22 90 DS Losartan 100 mg Last filled:12/30/22 90 DS, 10/02/22 90 DS Metformin 1000 mg Last filled:11/12/22 90 DS, 08/18/22 90 DS   Care Gaps: Annual wellness visit in last year? Yes Last eye exam / retinopathy screening:11/30/22 Last diabetic foot exam:Scheduled for 02/22/23   Corrie Mckusick, RMA

## 2023-01-17 ENCOUNTER — Ambulatory Visit: Payer: Medicare Other | Admitting: Gastroenterology

## 2023-01-26 ENCOUNTER — Ambulatory Visit: Payer: Medicare Other | Admitting: Cardiovascular Disease

## 2023-01-31 DIAGNOSIS — M0609 Rheumatoid arthritis without rheumatoid factor, multiple sites: Secondary | ICD-10-CM | POA: Diagnosis not present

## 2023-02-06 ENCOUNTER — Other Ambulatory Visit: Payer: Self-pay | Admitting: Nurse Practitioner

## 2023-02-06 DIAGNOSIS — K7460 Unspecified cirrhosis of liver: Secondary | ICD-10-CM | POA: Insufficient documentation

## 2023-02-06 DIAGNOSIS — K5730 Diverticulosis of large intestine without perforation or abscess without bleeding: Secondary | ICD-10-CM | POA: Diagnosis not present

## 2023-02-06 DIAGNOSIS — R933 Abnormal findings on diagnostic imaging of other parts of digestive tract: Secondary | ICD-10-CM | POA: Diagnosis not present

## 2023-02-06 DIAGNOSIS — Z79899 Other long term (current) drug therapy: Secondary | ICD-10-CM | POA: Diagnosis not present

## 2023-02-06 DIAGNOSIS — M0609 Rheumatoid arthritis without rheumatoid factor, multiple sites: Secondary | ICD-10-CM | POA: Diagnosis not present

## 2023-02-06 DIAGNOSIS — R1012 Left upper quadrant pain: Secondary | ICD-10-CM | POA: Diagnosis not present

## 2023-02-06 NOTE — Telephone Encounter (Signed)
Pharmacy advised patient insurance will not cover Continuous Blood Gluc Sensor (FREESTYLE LIBRE 14 DAY SENSOR) MISC [Pharmacy Med Name: FREESTYLE LIBRE 14 DAY SENSOR]  unless PCP contacts patient insurance. Informed patient of the PA process and she stated she would like to stick with this brand. Patient confirmed Medicare is her primary and [place]  is (secondary).

## 2023-02-06 NOTE — Telephone Encounter (Signed)
PA started for [employer] 24 day sensor through Covermy meds. Awaiting on determination

## 2023-02-06 NOTE — Telephone Encounter (Signed)
Requested medication (s) are due for refill today - yes  Requested medication (s) are on the active medication list -yes  Future visit scheduled -yes  Last refill: 12/04/22  Notes to clinic: Pharmacy request: PA required for coverage   Requested Prescriptions  Pending Prescriptions Disp Refills   Continuous Blood Gluc Sensor (FREESTYLE LIBRE [place]) [place] [Pharmacy Med Name: FREESTYLE LIBRE [place]  2    Sig: USE 1 KIT EVERY 14 (FOURTEEN) DAYS     Endocrinology: Diabetes - Testing Supplies Passed - 02/06/2023  1:51 PM      Passed - Valid encounter within last 12 months    Recent Outpatient Visits           2 months ago Type 2 diabetes mellitus with hyperglycemia, with long-term current use of insulin ([place])   [place], [place] T, NP   3 months ago LUQ abdominal pain   [place], St.  T, NP   4 months ago LUQ abdominal pain   [place], [place] T, NP   5 months ago Type 2 diabetes mellitus with hyperglycemia, with long-term current use of insulin ([place])   [place], [place] T, NP   5 months ago LUQ abdominal pain   [place], Barbaraann Faster, NP       Future Appointments             In 1 month [place], Barbaraann Faster, NP [place], [facility]   In 6 months Ralene Bathe, MD [place]               Requested Prescriptions  Pending Prescriptions Disp Refills   Continuous Blood Gluc Sensor (FREESTYLE LIBRE 14 DAY SENSOR) [place] [Pharmacy Med Name: FREESTYLE LIBRE 14 DAY SENSOR]  2    Sig: USE 1 KIT EVERY 14 (FOURTEEN) DAYS     Endocrinology: Diabetes - Testing Supplies Passed - 02/06/2023  1:51 PM      Passed - Valid encounter within last 12 months    Recent Outpatient Visits           2 months ago Type 2 diabetes mellitus with hyperglycemia, with  long-term current use of insulin ([place])   [place], [place] T, NP   3 months ago LUQ abdominal pain   [place], [place] T, NP   4 months ago LUQ abdominal pain   [place], [place] T, NP   5 months ago Type 2 diabetes mellitus with hyperglycemia, with long-term current use of insulin ([place])   [place], [place] T, NP   5 months ago LUQ abdominal pain   [place], Henrine Screws T, NP       Future Appointments             In 1 month [place], Barbaraann Faster, NP [place], [place]   In 6 months Ralene Bathe, MD [place]

## 2023-02-07 NOTE — Telephone Encounter (Signed)
Requested medication (s) are due for refill today: Pharmacy comment: Script Clarification:THIS IS NOT COVERED.   Requested medication (s) are on the active medication list: yes  Last refill:  02/06/23  Future visit scheduled: yes  Notes to clinic:  Pharmacy comment: Script Clarification:THIS IS NOT COVERED.      Requested Prescriptions  Pending Prescriptions Disp Refills   Continuous Blood Gluc Sensor (FREESTYLE LIBRE 14 DAY SENSOR) MISC [Pharmacy Med Name: FREESTYLE LIBRE 14 DAY SENSOR]  5    Sig: USE 1 KIT EVERY 14 (FOURTEEN) DAYS     Endocrinology: Diabetes - Testing Supplies Passed - 02/06/2023  5:00 PM      Passed - Valid encounter within last 12 months    Recent Outpatient Visits           2 months ago Type 2 diabetes mellitus with hyperglycemia, with long-term current use of insulin ([place])   [place], [place] T, NP   3 months ago LUQ abdominal pain   [place], [place] T, NP   4 months ago LUQ abdominal pain   [place], [place] T, NP   5 months ago Type 2 diabetes mellitus with hyperglycemia, with long-term current use of insulin ([place])   [place], Henrine Screws T, NP   5 months ago LUQ abdominal pain   [place], Barbaraann Faster, NP       Future Appointments             In 1 month [place], Barbaraann Faster, NP [place], [place]   In 6 months Ralene Bathe, MD [place]

## 2023-02-08 ENCOUNTER — Telehealth: Payer: Self-pay | Admitting: Nurse Practitioner

## 2023-02-08 DIAGNOSIS — R933 Abnormal findings on diagnostic imaging of other parts of digestive tract: Secondary | ICD-10-CM | POA: Diagnosis not present

## 2023-02-08 DIAGNOSIS — M0609 Rheumatoid arthritis without rheumatoid factor, multiple sites: Secondary | ICD-10-CM | POA: Diagnosis not present

## 2023-02-08 DIAGNOSIS — R1012 Left upper quadrant pain: Secondary | ICD-10-CM | POA: Diagnosis not present

## 2023-02-08 DIAGNOSIS — K7460 Unspecified cirrhosis of liver: Secondary | ICD-10-CM | POA: Diagnosis not present

## 2023-02-08 DIAGNOSIS — K5730 Diverticulosis of large intestine without perforation or abscess without bleeding: Secondary | ICD-10-CM | POA: Diagnosis not present

## 2023-02-08 DIAGNOSIS — Z79899 Other long term (current) drug therapy: Secondary | ICD-10-CM | POA: Diagnosis not present

## 2023-02-08 NOTE — Telephone Encounter (Signed)
Copied from [place] [id number]. Topic: General - Other >> Feb 08, 2023  2:04 PM Everette C wrote: Reason for CRM: The patient has been directed to contact their PCP and request prior authorization for their Continuous Blood Gluc Sensor (FREESTYLE LIBRE [place]) [employer] [id number]  Please contact the patient further when possible

## 2023-02-08 NOTE — Telephone Encounter (Signed)
PA started for [facility] freestyle 14 day sensor through Covermy meds. Awaiting on determination

## 2023-02-12 NOTE — Telephone Encounter (Signed)
[employer] and spoke with Nade C. About PA for Freestyle Libre 14 day sensor, he stated that PA was not required, was available with out PA however they only cover it at 85% after patient's deducible is paid and he also stated that patient has not paid any towards this years deducible. I called patient and informed her of this. Patient verbalized understanding

## 2023-02-12 NOTE — Telephone Encounter (Signed)
Pt called in checking status of PA for freestyle libre 14 day sensor.

## 2023-02-15 ENCOUNTER — Encounter: Payer: Self-pay | Admitting: Nurse Practitioner

## 2023-02-15 ENCOUNTER — Other Ambulatory Visit: Payer: Self-pay | Admitting: Nurse Practitioner

## 2023-02-15 NOTE — Telephone Encounter (Signed)
Requested medication (s) are due for refill today -yes  Requested medication (s) are on the active medication list -yes  Future visit scheduled -yes  Last refill: 11/24/22 #180  Notes to clinic: non delegated Rx  Requested Prescriptions  Pending Prescriptions Disp Refills   LORazepam (ATIVAN) 0.5 MG tablet [Pharmacy Med Name: LORAZEPAM 0.5 MG TABLET] 180 tablet 0    Sig: TAKE 1 TAB BY MOUTH TWICE A DAY (USE THE ADDITIONAL 30 TABS ONLY SPARINGLY & ONLY FOR SEVERE ANXIETY)     Not Delegated - Psychiatry: Anxiolytics/Hypnotics 2 Failed - 02/15/2023 10:37 AM      Failed - This refill cannot be delegated      Passed - Urine Drug Screen completed in last 360 days      Passed - Patient is not pregnant      Passed - Valid encounter within last 6 months    Recent Outpatient Visits           2 months ago Type 2 diabetes mellitus with hyperglycemia, with long-term current use of insulin ([place])   [place], [place] T, NP   4 months ago LUQ abdominal pain   [place], [place] T, NP   4 months ago LUQ abdominal pain   [place], [place] T, NP   5 months ago Type 2 diabetes mellitus with hyperglycemia, with long-term current use of insulin ([place])   [place], Skillman T, NP   6 months ago LUQ abdominal pain   [place], Henrine Screws T, NP       Future Appointments             In 3 weeks Cannady, Barbaraann Faster, NP [place], [facility]   In 6 months Ralene Bathe, MD [place]               Requested Prescriptions  Pending Prescriptions Disp Refills   LORazepam (ATIVAN) 0.5 MG tablet [Pharmacy Med Name: LORAZEPAM 0.5 MG TABLET] 180 tablet 0    Sig: TAKE 1 TAB BY MOUTH TWICE A DAY (USE THE ADDITIONAL 30 TABS ONLY SPARINGLY & ONLY FOR SEVERE ANXIETY)     Not Delegated -  Psychiatry: Anxiolytics/Hypnotics 2 Failed - 02/15/2023 10:37 AM      Failed - This refill cannot be delegated      Passed - Urine Drug Screen completed in last 360 days      Passed - Patient is not pregnant      Passed - Valid encounter within last 6 months    Recent Outpatient Visits           2 months ago Type 2 diabetes mellitus with hyperglycemia, with long-term current use of insulin ([place])   [place], [place] T, NP   4 months ago LUQ abdominal pain   [place], [place] T, NP   4 months ago LUQ abdominal pain   [place], [place] T, NP   5 months ago Type 2 diabetes mellitus with hyperglycemia, with long-term current use of insulin ([place])   [place], Henrine Screws T, NP   6 months ago LUQ abdominal pain   [place], Barbaraann Faster, NP       Future Appointments  In 3 weeks Venita Lick, NP [place], [place]   In 6 months Ralene Bathe, MD [place]

## 2023-03-01 DIAGNOSIS — M0609 Rheumatoid arthritis without rheumatoid factor, multiple sites: Secondary | ICD-10-CM | POA: Diagnosis not present

## 2023-03-01 DIAGNOSIS — Z79899 Other long term (current) drug therapy: Secondary | ICD-10-CM | POA: Diagnosis not present

## 2023-03-01 DIAGNOSIS — M159 Polyosteoarthritis, unspecified: Secondary | ICD-10-CM | POA: Diagnosis not present

## 2023-03-04 NOTE — Patient Instructions (Addendum)
Increase Basaglar to 15 to 16 units at night and monitor sugars closely, if frequent below 70 let me know.  Be Involved in Your Health Care:  Taking Medications When medications are taken as directed, they can greatly improve your health. But if they are not taken as instructed, they may not work. In some cases, not taking them correctly can be harmful. To help ensure your treatment remains effective and safe, understand your medications and how to take them.  Your lab results, notes and after visit summary will be available on My Chart. We strongly encourage you to use this feature. If lab results are abnormal the clinic will contact you with the appropriate steps. If the clinic does not contact you assume the results are satisfactory. You can always see your results on My Chart. If you have questions regarding your condition, please contact the clinic during office hours. You can also ask questions on My Chart.  We at [facility] are grateful that you chose [place] to provide care. We strive to provide excellent and compassionate care and are always looking for feedback. If you get a survey from the clinic please complete this.    Diabetes Mellitus Basics  Diabetes mellitus, or diabetes, is a long-term (chronic) disease. It occurs when the body does not properly use sugar (glucose) that is released from food after you eat. Diabetes mellitus may be caused by one or both of these problems: Your pancreas does not make enough of a hormone called insulin. Your body does not react in a normal way to the insulin that it makes. Insulin lets glucose enter cells in your body. This gives you energy. If you have diabetes, glucose cannot get into cells. This causes high blood glucose (hyperglycemia). How to treat and manage diabetes You may need to take insulin or other diabetes medicines daily to keep your glucose in balance. If you are prescribed insulin, you will learn how to give yourself  insulin by injection. You may need to adjust the amount of insulin you take based on the foods that you eat. You will need to check your blood glucose levels using a glucose monitor as told by your health care provider. The readings can help determine if you have low or high blood glucose. Generally, you should have these blood glucose levels: Before meals (preprandial): 80-130 mg/dL (4.0-9.8 mmol/L). After meals (postprandial): below 180 mg/dL (10 mmol/L). Hemoglobin A1c (HbA1c) level: less than 7%. Your health care provider will set treatment goals for you. Keep all follow-up visits. This is important. Follow these instructions at home: Diabetes medicines Take your diabetes medicines every day as told by your health care provider. List your diabetes medicines here: Name of medicine: ______________________________ Amount (dose): _______________ Time (a.m./p.m.): _______________ Notes: ___________________________________ Name of medicine: ______________________________ Amount (dose): _______________ Time (a.m./p.m.): _______________ Notes: ___________________________________ Name of medicine: ______________________________ Amount (dose): _______________ Time (a.m./p.m.): _______________ Notes: ___________________________________ Insulin If you use insulin, list the types of insulin you use here: Insulin type: ______________________________ Amount (dose): _______________ Time (a.m./p.m.): _______________Notes: ___________________________________ Insulin type: ______________________________ Amount (dose): _______________ Time (a.m./p.m.): _______________ Notes: ___________________________________ Insulin type: ______________________________ Amount (dose): _______________ Time (a.m./p.m.): _______________ Notes: ___________________________________ Insulin type: ______________________________ Amount (dose): _______________ Time (a.m./p.m.): _______________ Notes:  ___________________________________ Insulin type: ______________________________ Amount (dose): _______________ Time (a.m./p.m.): _______________ Notes: ___________________________________ Managing blood glucose  Check your blood glucose levels using a glucose monitor as told by your health care provider. Write down the times that you check your glucose levels here: Time: _______________  Notes: ___________________________________ Time: _______________ Notes: ___________________________________ Time: _______________ Notes: ___________________________________ Time: _______________ Notes: ___________________________________ Time: _______________ Notes: ___________________________________ Time: _______________ Notes: ___________________________________  Low blood glucose Low blood glucose (hypoglycemia) is when glucose is at or below 70 mg/dL (3.9 mmol/L). Symptoms may include: Feeling: Hungry. Sweaty and clammy. Irritable or easily upset. Dizzy. Sleepy. Having: A fast heartbeat. A headache. A change in your vision. Numbness around the mouth, lips, or tongue. Having trouble with: Moving (coordination). Sleeping. Treating low blood glucose To treat low blood glucose, eat or drink something containing sugar right away. If you can think clearly and swallow safely, follow the 15:15 rule: Take 15 grams of a fast-acting carb (carbohydrate), as told by your health care provider. Some fast-acting carbs are: Glucose tablets: take 3-4 tablets. Hard candy: eat 3-5 pieces. Fruit juice: drink 4 oz (120 mL). Regular (not diet) soda: drink 4-6 oz (120-180 mL). Honey or sugar: eat 1 Tbsp (15 mL). Check your blood glucose levels 15 minutes after you take the carb. If your glucose is still at or below 70 mg/dL (3.9 mmol/L), take 15 grams of a carb again. If your glucose does not go above 70 mg/dL (3.9 mmol/L) after 3 tries, get help right away. After your glucose goes back to normal, eat a meal  or a snack within 1 hour. Treating very low blood glucose If your glucose is at or below 54 mg/dL (3 mmol/L), you have very low blood glucose (severe hypoglycemia). This is an emergency. Do not wait to see if the symptoms will go away. Get medical help right away. Call your local emergency services (911 in the [place] Do not drive yourself to the hospital. Questions to ask your health care provider Should I talk with a diabetes educator? What equipment will I need to care for myself at home? What diabetes medicines do I need? When should I take them? How often do I need to check my blood glucose levels? What number can I call if I have questions? When is my follow-up visit? Where can I find a support group for people with diabetes? Where to find more information American Diabetes Association: www.diabetes.org Association of Diabetes Care and Education Specialists: www.diabeteseducator.org Contact a health care provider if: Your blood glucose is at or above 240 mg/dL (31.2 mmol/L) for 2 days in a row. You have been sick or have had a fever for 2 days or more, and you are not getting better. You have any of these problems for more than 6 hours: You cannot eat or drink. You feel nauseous. You vomit. You have diarrhea. Get help right away if: Your blood glucose is lower than 54 mg/dL (3 mmol/L). You get confused. You have trouble thinking clearly. You have trouble breathing. These symptoms may represent a serious problem that is an emergency. Do not wait to see if the symptoms will go away. Get medical help right away. Call your local emergency services (911 in the [place] Do not drive yourself to the hospital. Summary Diabetes mellitus is a chronic disease that occurs when the body does not properly use sugar (glucose) that is released from food after you eat. Take insulin and diabetes medicines as told. Check your blood glucose every day, as often as told. Keep all follow-up visits. This  is important. This information is not intended to replace advice given to you by your health care provider. Make sure you discuss any questions you have with your health care provider. Document Revised: 03/16/2020 Document Reviewed: 03/16/2020 Elsevier  Patient Education  2023 [employer]  

## 2023-03-09 ENCOUNTER — Ambulatory Visit (INDEPENDENT_AMBULATORY_CARE_PROVIDER_SITE_OTHER): Payer: Medicare Other | Admitting: Nurse Practitioner

## 2023-03-09 ENCOUNTER — Encounter: Payer: Self-pay | Admitting: Nurse Practitioner

## 2023-03-09 VITALS — BP 128/70 | HR 82 | Temp 97.6°F | Resp 18 | Ht 65.0 in | Wt 154.4 lb

## 2023-03-09 DIAGNOSIS — F5101 Primary insomnia: Secondary | ICD-10-CM

## 2023-03-09 DIAGNOSIS — I700 Atherosclerosis of aorta: Secondary | ICD-10-CM

## 2023-03-09 DIAGNOSIS — E1159 Type 2 diabetes mellitus with other circulatory complications: Secondary | ICD-10-CM | POA: Diagnosis not present

## 2023-03-09 DIAGNOSIS — F339 Major depressive disorder, recurrent, unspecified: Secondary | ICD-10-CM | POA: Diagnosis not present

## 2023-03-09 DIAGNOSIS — Z79899 Other long term (current) drug therapy: Secondary | ICD-10-CM | POA: Diagnosis not present

## 2023-03-09 DIAGNOSIS — E1165 Type 2 diabetes mellitus with hyperglycemia: Secondary | ICD-10-CM | POA: Diagnosis not present

## 2023-03-09 DIAGNOSIS — E1169 Type 2 diabetes mellitus with other specified complication: Secondary | ICD-10-CM

## 2023-03-09 DIAGNOSIS — Z794 Long term (current) use of insulin: Secondary | ICD-10-CM

## 2023-03-09 DIAGNOSIS — K7460 Unspecified cirrhosis of liver: Secondary | ICD-10-CM

## 2023-03-09 DIAGNOSIS — E785 Hyperlipidemia, unspecified: Secondary | ICD-10-CM | POA: Diagnosis not present

## 2023-03-09 DIAGNOSIS — E1122 Type 2 diabetes mellitus with diabetic chronic kidney disease: Secondary | ICD-10-CM

## 2023-03-09 DIAGNOSIS — M353 Polymyalgia rheumatica: Secondary | ICD-10-CM

## 2023-03-09 DIAGNOSIS — I771 Stricture of artery: Secondary | ICD-10-CM

## 2023-03-09 DIAGNOSIS — F413 Other mixed anxiety disorders: Secondary | ICD-10-CM | POA: Diagnosis not present

## 2023-03-09 DIAGNOSIS — I25708 Atherosclerosis of coronary artery bypass graft(s), unspecified, with other forms of angina pectoris: Secondary | ICD-10-CM | POA: Diagnosis not present

## 2023-03-09 DIAGNOSIS — N183 Chronic kidney disease, stage 3 unspecified: Secondary | ICD-10-CM | POA: Diagnosis not present

## 2023-03-09 DIAGNOSIS — I152 Hypertension secondary to endocrine disorders: Secondary | ICD-10-CM

## 2023-03-09 DIAGNOSIS — G2581 Restless legs syndrome: Secondary | ICD-10-CM

## 2023-03-09 DIAGNOSIS — R748 Abnormal levels of other serum enzymes: Secondary | ICD-10-CM

## 2023-03-09 LAB — BAYER DCA HB A1C WAIVED: HB A1C (BAYER DCA - WAIVED): 8 % — ABNORMAL HIGH (ref 4.8–5.6)

## 2023-03-09 MED ORDER — LORAZEPAM 0.5 MG PO TABS: ORAL_TABLET | ORAL | 0 refills | Status: DC

## 2023-03-09 NOTE — Assessment & Plan Note (Signed)
Chronic, stable.  BP at goal for age on recheck.  Continue current medication regimen and collaboration with cardiology. Labs: CMP.  Recommend she monitor BP at least three days a week at home and document for providers + bring to visits.  DASH diet focus.  Return in 3 months.

## 2023-03-09 NOTE — Assessment & Plan Note (Addendum)
Chronic, ongoing, followed by endocrinology in past.  A1c 8% today, trend down and at goal for age per geriatric society, remaining well below her previous levels in 9-10 range.  She wishes to avoid return to endo at this time as her previous provider left.  Will continue Basaglar but increase slightly to 15 units (wrote instructions down for her on this and explained at length) + cut back on her sliding scale with this if needed, continue Metformin.  Continue collaboration with CCM team in office.   Bring Libre to next visit.  Return to office in 3 months -- goal to avoid hypoglycemia.  If ongoing poor control will get back into endo. - Eye and foot exam up to date - Statin on board.  ARB on board.

## 2023-03-09 NOTE — Assessment & Plan Note (Signed)
Chronic, stable Stage 3a.  Continue Losartan for kidney protection.  Urine ALB 26 December 2022. Continue collaboration with cardiology.  Consider nephrology referral if worsening function.  Labs: CMP. 

## 2023-03-09 NOTE — Assessment & Plan Note (Signed)
Chronic, ongoing.  Continue collaboration with rheumatology and current medication regimen.  Recent notes reviewed. 

## 2023-03-09 NOTE — Assessment & Plan Note (Signed)
Stable, continue collaboration with cardiology and preventative treatment to include ASA, Plavix, and statin. 

## 2023-03-09 NOTE — Assessment & Plan Note (Signed)
Chronic, ongoing for insomnia.  Has had trials of other medications and reductions with poor outcomes.  Fully aware of risks of chronic benzo use and aware this will be discussed each visit.  UDS up to date (due in July 2024) and contract on file.

## 2023-03-09 NOTE — Addendum Note (Signed)
Addended by: Aura Dials T on: 03/09/2023 05:09 PM   Modules accepted: Orders

## 2023-03-09 NOTE — Assessment & Plan Note (Signed)
Continue collaboration with GI -- recheck levels today and remain of GLP1 and Januvia.

## 2023-03-09 NOTE — Assessment & Plan Note (Signed)
Ongoing, will continue collaboration with GI at this time.  Remain of Januvia and GLP1.  Recent notes reviewed.

## 2023-03-09 NOTE — Assessment & Plan Note (Signed)
Chronic, ongoing.  Noted on imaging CT 07/28/22.  Continue statin and Plavix therapy. 

## 2023-03-09 NOTE — Assessment & Plan Note (Signed)
Chronic, ongoing.  Continue current medication regimen and adjust as needed.  Lipid panel today.  Discussed with patient, if myalgias with Atorvastatin, + her history of myalgia with statins, could consider Repatha initiation if poor tolerance. 

## 2023-03-09 NOTE — Assessment & Plan Note (Signed)
Chronic, stable with Gabapentin, continue this regimen and adjust as needed. Renal dose as needed. 

## 2023-03-09 NOTE — Assessment & Plan Note (Signed)
Chronic, ongoing.  Denies SI/HI.  Continue Sertraline and Ativan.  Has had multiple attempts at reduction of benzo with no success.  Discussed at length risks with her and she is aware.  Continue Ativan and fill upon request.  PDMP reviewed.  Return in 3 months, UDS up to date (06/01/23 due next) and contract on file.

## 2023-03-09 NOTE — Assessment & Plan Note (Signed)
Chronic, ongoing with use of Lorazepam nightly.  Has had trial reductions in past and other medications without success.  Is fully aware of risks of chronic benzo use and aware this will be discussed each visit.  Return in 3 months.  UDS up to date (due next 06/01/23), contract up to date.

## 2023-03-09 NOTE — Progress Notes (Addendum)
BP 128/70 (BP Location: Left Arm, Patient Position: Sitting, Cuff Size: Normal)   Pulse 82   Temp 97.6 F (36.4 C) (Oral)   Resp 18   Ht 5\' 5"  (1.651 m)   Wt 154 lb 6.4 oz (70 kg)   SpO2 96%   BMI 25.69 kg/m    Subjective:    Patient ID: Laura Rojas, female    DOB: 1945/12/24, 77 y.o.   MRN: [id number]  HPI: [name] is a 77 y.o. female  Chief Complaint  Patient presents with   Diabetes   Hypertension   Hyperlipidemia   Mood   Chronic Kidney Disease   DIABETES A1c 8.6% January. She continues on Basaglar, Novolog sliding scale, and Metformin.  Does endorse poor diet over Easter and birthday parties.  Had to stop Januvia recently due to pancreatitis and elevation in Lipase and Amylase -- had a visit with GI on 02/06/23, they offered EGD and colonoscopy but wishes to hold off at this time.  She is to return in 6 months to monitor cirrhosis.  Followed by endocrinology in past, last saw NP [facility] 05/16/21, has not returned as NP no longer at the practice and she prefers to stay with PCP at this time.     She did not tolerate GLP1 (Trulicity) in past and had side effects with [place].   Hypoglycemic episodes: a couple that have been less then 60 Polydipsia/polyuria: no Visual disturbance: no Chest pain: no Paresthesias: no Glucose Monitoring: yes             Accucheck frequency: Freestyle in place -- 60 to 300             Fasting glucose:              Post prandial:             Evening:              Before meals: Taking Insulin?: yes             Long acting insulin: 13 units often at night             Short acting insulin: sliding scale units with meals (22 units) Blood Pressure Monitoring: rarely Retinal Examination: Up to Date -- [facility] Foot Exam: Up to Date Pneumovax: Up to Date Influenza: Up to Date Aspirin: yes   HYPERTENSION / HYPERLIPIDEMIA/CAD  Continues on Amlodipine, Plavix, HCTZ, Fish oil, Losartan, Lipitor. Dr. Royann Shivers, last saw 12/04/22,  sees yearly. She is followed due to subclavian artery stenosis and angina. Satisfied with current treatment? yes Duration of hypertension: chronic BP monitoring frequency: not checking BP range:  BP medication side effects: no Duration of hyperlipidemia: chronic Cholesterol medication side effects: no Cholesterol supplements: fish oil Medication compliance: good compliance Aspirin: yes Recent stressors: no Recurrent headaches: no Visual changes: no Palpitations: no Dyspnea: no Chest pain: no Lower extremity edema: no Dizzy/lightheaded: no   CHRONIC KIDNEY DISEASE Stable on recent labs, Stage 3a. CKD status: stable Medications renally dose: yes Previous renal evaluation: no Pneumovax:  Up to Date Influenza Vaccine:  Up to Date    POLYMYALGIA RHEUMATICA & RLS Diagnosis of polymyalgia rheumatica syndrome. Saw rheumatology last 03/01/23, getting injections at this time, this has helped pain tremendously.  Continues on Gabapentin for RLS at bedtime only, although she reports she is not sure she is taking this often. Duration: chronic Involved joints: right hand, both feet, and left knee Mechanism of injury: arthritis Severity:  0/10 today Quality:  dull, aching and burning Frequency: intermittent  Radiation: no Aggravating factors: walking and movement  Alleviating factors: Aspercreme and APAP  Status: stable Treatments attempted: Tylenol and Aspercreme Relief with NSAIDs?:  No NSAIDs Taken Weakness with weight bearing or walking: no Sensation of giving way: no Locking: no Popping: no Bruising: no Swelling: no Redness: no Paresthesias/decreased sensation: no Fevers: no    ANXIETY/STRESS Continues Zoloft 150 MG daily.  Continues Ativan 0.5 MG, takes this at night only, although ordered BID -- currently reports she takes 1 MG at night. Does not take extra during day.  Pt aware of risks of benzo medication use to include increased sedation, respiratory suppression, falls,  dependence and cardiovascular events.  Pt would like to continue treatment as benefit determined to outweigh risk. On review PDMP last Ativan fill 11/28/22 for 3 months supply, previous PCP ordered this way. Duration:stable Anxious mood: no Excessive worrying: no Irritability: no  Sweating: no Nausea: no Palpitations:no Hyperventilation: no Panic attacks: no Agoraphobia: no  Obscessions/compulsions: no Depressed mood: no    03/09/2023   11:00 AM 12/08/2022   11:07 AM 10/16/2022    3:30 PM 09/07/2022    9:24 AM 09/04/2022    8:53 AM  Depression screen PHQ 2/9  Decreased Interest 0 0 0 0 0  Down, Depressed, Hopeless 0 0 0 0 0  PHQ - 2 Score 0 0 0 0 0  Altered sleeping 0  Tired, decreased energy 1 0 1 0 0  Change in appetite 1 0 0 0 0  Feeling bad or failure about yourself  0 0 0 0 0  Trouble concentrating 0 0 0 0 0  Moving slowly or fidgety/restless 0 0 0 0 0  Suicidal thoughts 0 0 0 0 0  PHQ-9 Score 0  Difficult doing work/chores Not difficult at all Not difficult at all Not difficult at all Not difficult at all Not difficult at all      03/09/2023   11:00 AM 12/08/2022   11:08 AM 10/16/2022    3:30 PM 09/07/2022    9:24 AM  GAD 7 : Generalized Anxiety Score  Nervous, Anxious, on Edge 0 0 0 0  Control/stop worrying 0 0 0 0  Worry too much - different things 0 0 0 0  Trouble relaxing 0 0 0 0  Restless 0 0 0 0  Easily annoyed or irritable 0 0 0 0  Afraid - awful might happen 0 0 0 0  Total GAD 7 Score 0 0 0 0  Anxiety Difficulty Not difficult at all Not difficult at all Not difficult at all Not difficult at all      Relevant past medical, surgical, family and social history reviewed and updated as indicated. Interim medical history since our last visit reviewed. Allergies and medications reviewed and updated.  Review of Systems  Constitutional:  Negative for activity change, appetite change, diaphoresis, fatigue and fever.  Respiratory:  Negative for  cough, chest tightness and shortness of breath.   Cardiovascular:  Negative for chest pain, palpitations and leg swelling.  Gastrointestinal: Negative.   Endocrine: Negative for cold intolerance, heat intolerance, polydipsia, polyphagia and polyuria.  Neurological:  Negative for dizziness, syncope, weakness, light-headedness, numbness and headaches.  Psychiatric/Behavioral: Negative.      Per HPI unless specifically indicated above     Objective:    BP 128/70 (BP Location: Left Arm, Patient Position: Sitting, Cuff Size: Normal)  Pulse 82   Temp 97.6 F (36.4 C) (Oral)   Resp 18   Ht 5\' 5"  (1.651 m)   Wt 154 lb 6.4 oz (70 kg)   SpO2 96%   BMI 25.69 kg/m   Wt Readings from Last 3 Encounters:  03/09/23 154 lb 6.4 oz (70 kg)  12/08/22 152 lb 9.6 oz (69.2 kg)  12/04/22 153 lb 9.6 oz (69.7 kg)    Physical Exam Vitals and nursing note reviewed.  Constitutional:      General: She is awake. She is not in acute distress.    Appearance: She is well-developed, well-groomed and overweight. She is not ill-appearing.  HENT:     Head: Normocephalic.     Right Ear: Hearing normal.     Left Ear: Hearing normal.  Eyes:     General: Lids are normal.        Right eye: No discharge.        Left eye: No discharge.     Conjunctiva/sclera: Conjunctivae normal.     Pupils: Pupils are equal, round, and reactive to light.  Neck:     Thyroid: No thyromegaly.     Vascular: Carotid bruit (bilateral) present.  Cardiovascular:     Rate and Rhythm: Normal rate and regular rhythm.     Heart sounds: Normal heart sounds. No murmur heard.    No gallop.  Pulmonary:     Effort: Pulmonary effort is normal. No accessory muscle usage or respiratory distress.     Breath sounds: No wheezing or rhonchi.  Abdominal:     General: Bowel sounds are normal.     Palpations: Abdomen is soft.  Musculoskeletal:     Cervical back: Normal range of motion and neck supple.     Right lower leg: No edema.     Left  lower leg: No edema.  Skin:    General: Skin is warm and dry.  Neurological:     Mental Status: She is alert and oriented to person, place, and time.  Psychiatric:        Attention and Perception: Attention normal.        Mood and Affect: Mood normal.        Speech: Speech normal.        Behavior: Behavior normal. Behavior is cooperative.        Thought Content: Thought content normal.    Diabetic Foot Exam - Simple   Simple Foot Form Visual Inspection No deformities, no ulcerations, no other skin breakdown bilaterally: Yes Sensation Testing Intact to touch and monofilament testing bilaterally: Yes Pulse Check Posterior Tibialis and Dorsalis pulse intact bilaterally: Yes Comments     Results for orders placed or performed in visit on 12/08/22  Bayer DCA Hb A1c Waived  Result Value Ref Range   HB A1C (BAYER DCA - WAIVED) 8.6 (H) 4.8 - 5.6 %  Microalbumin, Urine Waived  Result Value Ref Range   Microalb, Ur Waived 30 (H) 0 - 19 mg/L   Creatinine, Urine Waived 200 10 - 300 mg/dL   Microalb/Creat Ratio <30 <30 mg/g  Comprehensive metabolic panel  Result Value Ref Range   Glucose 126 (H) 70 - 99 mg/dL   BUN 16 8 - 27 mg/dL   Creatinine, Ser 1.61 (H) 0.57 - 1.00 mg/dL   eGFR 50 (L) >09 UE/AVW/0.98   BUN/Creatinine Ratio 14 12 - 28   Sodium 142 134 - 144 mmol/L   Potassium 4.2 3.5 - 5.2 mmol/L   Chloride  104 96 - 106 mmol/L   CO2 26 20 - 29 mmol/L   Calcium 9.8 8.7 - 10.3 mg/dL   Total Protein 6.4 6.0 - 8.5 g/dL   Albumin 4.4 3.8 - 4.8 g/dL   Globulin, Total 2.0 1.5 - 4.5 g/dL   Albumin/Globulin Ratio 2.2 1.2 - 2.2   Bilirubin Total 0.4 0.0 - 1.2 mg/dL   Alkaline Phosphatase 144 (H) 44 - 121 IU/L   AST 20 0 - 40 IU/L   ALT 17 0 - 32 IU/L  Lipid Panel w/o Chol/HDL Ratio  Result Value Ref Range   Cholesterol, Total 161 100 - 199 mg/dL   Triglycerides 110 0 - 149 mg/dL   HDL 63 >31 mg/dL   VLDL Cholesterol Cal 24 5 - 40 mg/dL   LDL Chol Calc (NIH) 74 0 - 99 mg/dL   TSH  Result Value Ref Range   TSH 1.780 0.450 - 4.500 uIU/mL  Magnesium  Result Value Ref Range   Magnesium 1.8 1.6 - 2.3 mg/dL  Amylase  Result Value Ref Range   Amylase 145 (H) 31 - 110 U/L  Lipase  Result Value Ref Range   Lipase 359 (H) 14 - 85 U/L  Vitamin B12  Result Value Ref Range   Vitamin B-12 1,593 (H) 232 - 1,245 pg/mL      Assessment & Plan:   Problem List Items Addressed This Visit       Cardiovascular and Mediastinum   Aortic atherosclerosis    Chronic, ongoing.  Noted on imaging CT 07/28/22.  Continue statin and Plavix therapy.      Relevant Orders   Comprehensive metabolic panel   Lipid Panel w/o Chol/HDL Ratio   Coronary artery disease of bypass graft of native heart with stable angina pectoris    Chronic, stable.  Continue collaboration with cardiology and preventative treatment, including statin and ASA.      Relevant Orders   Comprehensive metabolic panel   Lipid Panel w/o Chol/HDL Ratio   Hypertension associated with diabetes    Chronic, stable.  BP at goal for age on recheck.  Continue current medication regimen and collaboration with cardiology. Labs: CMP.  Recommend she monitor BP at least three days a week at home and document for providers + bring to visits.  DASH diet focus.  Return in 3 months.      Relevant Orders   Bayer DCA Hb A1c Waived   Comprehensive metabolic panel   Subclavian artery stenosis, left    Stable, continue collaboration with cardiology and preventative treatment to include ASA, Plavix, and statin.        Digestive   Cirrhosis, non-alcoholic    Ongoing, will continue collaboration with GI at this time.  Remain of Januvia and GLP1.  Recent notes reviewed.      Relevant Orders   Comprehensive metabolic panel     Endocrine   CKD stage 3 due to type 2 diabetes mellitus    Chronic, stable Stage 3a.  Continue Losartan for kidney protection.  Urine ALB 26 December 2022. Continue collaboration with cardiology.  Consider  nephrology referral if worsening function.  Labs: CMP.      Relevant Orders   Bayer DCA Hb A1c Waived   Comprehensive metabolic panel   Hyperlipidemia associated with type 2 diabetes mellitus    Chronic, ongoing.  Continue current medication regimen and adjust as needed.  Lipid panel today.  Discussed with patient, if myalgias with Atorvastatin, + her history of myalgia with  statins, could consider Repatha initiation if poor tolerance.      Relevant Orders   Bayer DCA Hb A1c Waived   Comprehensive metabolic panel   Lipid Panel w/o Chol/HDL Ratio   Type 2 diabetes mellitus with hyperglycemia, with long-term current use of insulin - Primary    Chronic, ongoing, followed by endocrinology in past.  A1c 8% today, trend down and at goal for age per geriatric society, remaining well below her previous levels in 9-10 range.  She wishes to avoid return to endo at this time as her previous provider left.  Will continue Basaglar but increase slightly to 15 units (wrote instructions down for her on this and explained at length) + cut back on her sliding scale with this if needed, continue Metformin.  Continue collaboration with CCM team in office.   Bring Libre to next visit.  Return to office in 3 months -- goal to avoid hypoglycemia.  If ongoing poor control will get back into endo. - Eye and foot exam up to date - Statin on board.  ARB on board.      Relevant Orders   Bayer DCA Hb A1c Waived   Comprehensive metabolic panel     Other   Anxiety disorder    Chronic, ongoing.  Denies SI/HI.  Continue Sertraline and Ativan.  Has had multiple attempts at reduction of benzo with no success.  Discussed at length risks with her and she is aware.  Continue Ativan and fill upon request.  PDMP reviewed.  Return in 3 months, UDS up to date (06/01/23 due next) and contract on file.      Relevant Medications   LORazepam (ATIVAN) 0.5 MG tablet   Depression, recurrent    Chronic, ongoing.  Denies SI/HI.   Continue Sertraline 150 MG daily as is ordered. This is offering benefit to mood overall and may help reduce benzo use.  Consider increase to 200 MG in future for increased benefit.  Return in 3 months.      Relevant Medications   LORazepam (ATIVAN) 0.5 MG tablet   Elevated amylase and lipase    Continue collaboration with GI -- recheck levels today and remain of GLP1 and Januvia.      Relevant Orders   Amylase   Lipase   Insomnia    Chronic, ongoing with use of Lorazepam nightly.  Has had trial reductions in past and other medications without success.  Is fully aware of risks of chronic benzo use and aware this will be discussed each visit.  Return in 3 months.  UDS up to date (due next 06/01/23), contract up to date.      Long-term current use of benzodiazepine    Chronic, ongoing for insomnia.  Has had trials of other medications and reductions with poor outcomes.  Fully aware of risks of chronic benzo use and aware this will be discussed each visit.  UDS up to date (due in July 2024) and contract on file.      Polymyalgia rheumatica    Chronic, ongoing.  Continue collaboration with rheumatology and current medication regimen.  Recent notes reviewed.      Relevant Orders   Comprehensive metabolic panel   Restless leg syndrome    Chronic, stable with Gabapentin, continue this regimen and adjust as needed. Renal dose as needed.        Follow up plan: Return in about 3 months (around 06/08/2023) for T2DM, HTN/HLD, MOOD, RA, INSOMNIA -- UDS due.

## 2023-03-09 NOTE — Assessment & Plan Note (Addendum)
Chronic, stable.  Continue collaboration with cardiology and preventative treatment, including statin and ASA.

## 2023-03-09 NOTE — Assessment & Plan Note (Signed)
Chronic, ongoing.  Denies SI/HI.  Continue Sertraline 150 MG daily as is ordered. This is offering benefit to mood overall and may help reduce benzo use.  Consider increase to 200 MG in future for increased benefit.  Return in 3 months. 

## 2023-03-10 LAB — COMPREHENSIVE METABOLIC PANEL
ALT: 22 IU/L (ref 0–32)
AST: 22 IU/L (ref 0–40)
Albumin/Globulin Ratio: 2.1 (ref 1.2–2.2)
Albumin: 4.4 g/dL (ref 3.8–4.8)
Alkaline Phosphatase: 142 IU/L — ABNORMAL HIGH (ref 44–121)
BUN/Creatinine Ratio: 20 (ref 12–28)
BUN: 18 mg/dL (ref 8–27)
Bilirubin Total: 0.3 mg/dL (ref 0.0–1.2)
CO2: 24 mmol/L (ref 20–29)
Calcium: 9.9 mg/dL (ref 8.7–10.3)
Chloride: 103 mmol/L (ref 96–106)
Creatinine, Ser: 0.92 mg/dL (ref 0.57–1.00)
Globulin, Total: 2.1 g/dL (ref 1.5–4.5)
Glucose: 179 mg/dL — ABNORMAL HIGH (ref 70–99)
Potassium: 4.2 mmol/L (ref 3.5–5.2)
Sodium: 142 mmol/L (ref 134–144)
Total Protein: 6.5 g/dL (ref 6.0–8.5)
eGFR: 64 mL/min/{1.73_m2} (ref >59)

## 2023-03-10 LAB — LIPID PANEL W/O CHOL/HDL RATIO
Cholesterol, Total: 153 mg/dL (ref 100–199)
HDL: 64 mg/dL (ref >39)
LDL Chol Calc (NIH): 61 mg/dL (ref 0–99)
Triglycerides: 166 mg/dL — ABNORMAL HIGH (ref 0–149)
VLDL Cholesterol Cal: 28 mg/dL (ref 5–40)

## 2023-03-10 LAB — LIPASE: Lipase: 146 U/L — ABNORMAL HIGH (ref 14–85)

## 2023-03-10 LAB — AMYLASE: Amylase: 118 U/L — ABNORMAL HIGH (ref 31–110)

## 2023-03-10 NOTE — Progress Notes (Signed)
Contacted via MyChart   Good afternoon Bunia, your labs have returned: - Kidney function, creatinine and eGFR, is improved this check to more normal range.  Continue to ensure good water intake daily. - Liver function, AST and ALT, is normal.  Alkaline phosphatase, which often looks at gall bladder, remains a little elevated and we will monitor.  Pancreas labs, amylase and lipase, remain mildly elevated but are trending down.  We will continue to check these at visits and work with your GI doctor. - Cholesterol labs show stable LDL, continue Atorvastatin.  Any questions? Keep being amazing!!  Thank you for allowing me to participate in your care.  I appreciate you. Kindest regards, Dulse Rutan

## 2023-03-12 ENCOUNTER — Encounter: Payer: Self-pay | Admitting: Nurse Practitioner

## 2023-03-15 ENCOUNTER — Telehealth: Payer: Self-pay

## 2023-03-15 NOTE — Telephone Encounter (Signed)
Paperwork faxed back to [facility]   

## 2023-03-28 ENCOUNTER — Ambulatory Visit: Admit: 2023-03-28 | Payer: Medicare Other | Admitting: Internal Medicine

## 2023-03-28 SURGERY — ESOPHAGOGASTRODUODENOSCOPY (EGD) WITH PROPOFOL
Anesthesia: General

## 2023-04-03 DIAGNOSIS — M0609 Rheumatoid arthritis without rheumatoid factor, multiple sites: Secondary | ICD-10-CM | POA: Diagnosis not present

## 2023-04-12 ENCOUNTER — Other Ambulatory Visit: Payer: Self-pay | Admitting: Nurse Practitioner

## 2023-04-12 DIAGNOSIS — Z794 Long term (current) use of insulin: Secondary | ICD-10-CM

## 2023-04-16 ENCOUNTER — Telehealth: Payer: Self-pay

## 2023-04-16 NOTE — Progress Notes (Unsigned)
   Care Guide Note  04/16/2023 Name: NATALLIA KAPNER MRN: [id number] DOB: 06-04-1946  Referred by: Marjie Skiff, NP Reason for referral : Care Coordination (Outreach to schedule with Pharm d )   BRIA VEENEMAN is a 77 y.o. year old female who is a primary care patient of Cannady, Dorie Rank, NP. AALAYNA WANNAMAKER was referred to the pharmacist for assistance related to DM.    An unsuccessful telephone outreach was attempted today to contact the patient who was referred to the pharmacy team for assistance with medication assistance. Additional attempts will be made to contact the patient.   Penne Lash, RMA Care Guide [facility]  [place], [place] [address] Direct Dial: [phone] [email or web address]

## 2023-04-17 NOTE — Progress Notes (Signed)
   Care Guide Note  04/17/2023 Name: Laura Rojas MRN: [id number] DOB: 01-14-1946  Referred by: Marjie Skiff, NP Reason for referral : Care Coordination (Outreach to schedule with Pharm d )   Laura Rojas is a 77 y.o. year old female who is a primary care patient of Cannady, Dorie Rank, NP. Laura Rojas was referred to the pharmacist for assistance related to DM.    Successful contact was made with the patient to discuss pharmacy services including being ready for the pharmacist to call at least 5 minutes before the scheduled appointment time, to have medication bottles and any blood sugar or blood pressure readings ready for review. The patient agreed to meet with the pharmacist via with the pharmacist via telephone visit on (date/time).  04/18/2023   Penne Lash, RMA Care Guide [facility]  [place], [place] [address] Direct Dial: [phone] [email or web address]

## 2023-04-18 ENCOUNTER — Other Ambulatory Visit: Payer: Medicare Other

## 2023-04-18 NOTE — Progress Notes (Signed)
04/18/2023 Name: Laura Rojas MRN: [id number] DOB: Oct 13, 1946  Chief Complaint  Patient presents with   Diabetes    [name] is a 77 y.o. year old female who presented for a telephone visit.   They were referred to the pharmacist by their PCP for assistance in managing diabetes.   Patient is participating in a Managed Medicaid Plan:  No  Subjective: Telephone visit to discuss medication management related to chronic conditions, specifically diabetes. Care Team: Primary Care Provider: Marjie Skiff, NP ; Next Scheduled Visit: 06/08/23  Medication Access/Adherence Current Pharmacy:  [id number] [place] - [address] [place] [address] Phone: [phone] Fax: [phone]  -Patient reports affordability concerns with their medications: Yes  -Patient reports access/transportation concerns to their pharmacy: No  -Patient reports adherence concerns with their medications:  No    Diabetes: Current medications: metformin 500mg  BID, Basaglar 8-16 units daily, Novolog approximately 20 units TID -Medications tried in the past: Januvia (pancreatitis), Trulicity (could not tolerate), Farxiga (could not tolerate), pioglitazone (hot flashes) -Metformin at 1000mg  BID dosing cause adverse GI effects -Current glucose readings: FBG 148 this morning -Using [facility] for CGM but states this is costing her $240 every 3 months -Patient reports hypoglycemic s/sx including dizziness, shakiness, sweating. This occurs approximately 1 time per week; CGM will alert and patient knows how to properly address. -Patient denies hyperglycemic symptoms including polyuria, polydipsia, polyphagia, nocturia, neuropathy, blurred vision.  Hypertension: Current medications: amlodipine 2.5mg  daily, hctz 12.5mg  daily, losartan 100mg  daily -Patient does not have a validated, automated, upper arm home BP cuff -Most recent OV reading 128/70  Hyperlipidemia/ASCVD Risk  Reduction Current lipid lowering medications: atorvastatin 40mg  daily  -Medications tried in the past: rosuvastatin (myalgias) Antiplatelet regimen: ASA 81mg  daily, clopidogrel 75mg  daily  -Patient reports only taking one-half tablet of clopidogrel 75mg  daily  Objective: Lab Results  Component Value Date   HGBA1C 8.0 (H) 03/09/2023   Lab Results  Component Value Date   CREATININE 0.92 03/09/2023   BUN 18 03/09/2023   NA 142 03/09/2023   K 4.2 03/09/2023   CL 103 03/09/2023   CO2 24 03/09/2023   Lab Results  Component Value Date   CHOL 153 03/09/2023   HDL 64 03/09/2023   LDLCALC 61 03/09/2023   TRIG 166 (H) 03/09/2023   CHOLHDL 2.9 08/28/2018   Medications Reviewed Today     Reviewed by Marjie Skiff, NP (Nurse Practitioner) on 03/09/23 at 1114  Med List Status: <None>   Medication Order Taking? Sig Documenting Provider Last Dose Status Informant  amLODipine (NORVASC) 2.5 MG tablet [id number] Yes Take 1 tablet (2.5 mg total) by mouth daily. Aura Dials T, NP Taking Active   aspirin 81 MG tablet [id number] Yes Take 81 mg by mouth daily. [provider] Taking Active Self  atorvastatin (LIPITOR) 40 MG tablet [id number] Yes TAKE 1 TABLET BY MOUTH EVERY DAY** STOP THE SIMVASTATIN Cannady, Jolene T, NP Taking Active   BD PEN NEEDLE NANO U/F 32G X 4 MM MISC [id number] Yes SMARTSIG:1 Each SUB-Q 4 Times Daily [provider] Taking Active   certolizumab pegol (CIMZIA) 2 X 200 MG/ML PSKT [id number] Yes Inject into the skin. [provider] Taking Active   cholecalciferol (VITAMIN D3) 25 MCG (1000 UT) tablet [id number] Yes Take 1,000 Units by mouth daily. [provider] Taking Active   clopidogrel (PLAVIX) 75 MG tablet [id number] Yes Take 1 tablet (75 mg total) by mouth daily.  Marjie Skiff, NP Taking Active   Continuous Blood Gluc Sensor (FREESTYLE LIBRE 14 DAY SENSOR) [place] [id number] Yes USE 1 KIT EVERY 14 (FOURTEEN) DAYS Cannady, Jolene T, NP  Taking Active   Gabapentin, Once-Daily, 300 MG TABS [id number] Yes Take 300 mg by mouth at bedtime. Aura Dials T, NP Taking Active   hydrochlorothiazide (MICROZIDE) 12.5 MG capsule [id number] Yes TAKE 1 CAPSULE BY MOUTH EVERY DAY Cannady, Jolene T, NP Taking Active   hydrOXYzine (ATARAX) 25 MG tablet [id number] Yes Take 1 tablet by mouth at bedtime. [provider] Taking Active   insulin aspart (NOVOLOG FLEXPEN) 100 UNIT/ML FlexPen [id number] Yes INJECT 16 UNITS INTO THE SKIN 3 (THREE) TIMES DAILY. INJECT PER SLIDING SCALE REGIMEN. Aura Dials T, NP Taking Active   Insulin Glargine (BASAGLAR KWIKPEN) 100 UNIT/ML [id number] Yes Inject 16 Units into the skin at bedtime. Aura Dials T, NP Taking Active   Insulin Pen Needle (BD PEN NEEDLE NANO U/F) 32G X 4 MM MISC [id number] Yes Use 1 each 4 (four) times daily [provider] Taking Active   Insulin Pen Needle (BD PEN NEEDLE NANO U/F) 32G X 4 MM MISC [id number] Yes USE 1 EACH 4 (FOUR) TIMES DAILY Cannady, Jolene T, NP Taking Active   Iron-Vitamin C 65-125 MG TABS [id number] Yes Take 1 tablet by mouth daily. Aura Dials T, NP Taking Active   LORazepam (ATIVAN) 0.5 MG tablet [id number] Yes TAKE 1 TAB BY MOUTH TWICE A DAY (USE THE ADDITIONAL 30 TABS ONLY SPARINGLY & ONLY FOR SEVERE ANXIETY) Cannady, Jolene T, NP Taking Active   losartan (COZAAR) 100 MG tablet [id number] Yes Take 1 tablet (100 mg total) by mouth daily. Aura Dials T, NP Taking Active   Magnesium 400 MG TABS [id number] Yes Take 1 tablet by mouth daily. [provider] Taking Active   metFORMIN (GLUCOPHAGE) 1000 MG tablet [id number] Yes Take 0.5 tablets (500 mg total) by mouth 2 (two) times daily. Aura Dials T, NP Taking Active   Multiple Vitamin (MULTIVITAMIN) tablet [id number] Yes Take 1 tablet by mouth daily. [provider] Taking Active   Omega-3 Fatty Acids (FISH OIL) 1200 MG CAPS [id number] Yes Take 1,200 mg by mouth daily. [provider] Taking Active Self  pantoprazole (PROTONIX) 40 MG tablet [id number] Yes Take 1 tablet (40 mg total) by mouth daily. Aura Dials T, NP Taking Active   sertraline (ZOLOFT) 100 MG tablet [id number] Yes TAKE 1.5 TABLETS BY MOUTH DAILY Cannady, Jolene T, NP Taking Active   valACYclovir (VALTREX) 1000 MG tablet [id number] Yes TAKE 1 TABLET (1,000 MG TOTAL) BY MOUTH DAILY FOR 7 DAYS. Aura Dials T, NP Taking Active   vitamin B-12 (CYANOCOBALAMIN) 1000 MCG tablet [id number] Yes Take 1,000 mcg by mouth daily. [provider] Taking Active            Assessment/Plan:   Diabetes: - Currently uncontrolled - Submitted prescription for [employer] under [facility] standing order to mail order pharmacy that bills part B for DME.  Part B will typically cover these supplies a little or no cost. - Contacting patient to inform her that DME supplier will be reaching out with cost and asking that she keep me informed, so I can further assist if needed. - Could try XR metformin to attempt 1000mg  BID dosing, as this has decreased GI effects compared to immediate release - Very limited when it comes to medication options for Ms. Wisecarver based on history  Hypertension: - Currently controlled - Recommend to continue  current regimen   Hyperlipidemia/ASCVD Risk Reduction: - Currently controlled.  - Consulting with PCP in regard to clopidogrel administration; I do not see documentation reflecting a decrease to one-half tablet.    Follow Up Plan: Lenna Gilford, PharmD, DPLA   Lenna Gilford, PharmD, DPLA

## 2023-04-20 ENCOUNTER — Telehealth: Payer: Self-pay

## 2023-04-20 NOTE — Progress Notes (Signed)
   04/20/2023  Patient ID: Laura Rojas, female   DOB: 1946/10/26, 77 y.o.   MRN: [id number]  Patient returned my call and states that cardiology told her years ago to take one-half tablet of clopidogrel.  She is going to call [facility] this afternoon to check on [place] pricing with them billing part B.  Lenna Gilford, PharmD, DPLA

## 2023-04-20 NOTE — Progress Notes (Addendum)
   04/20/2023  Patient ID: Laura Rojas, female   DOB: 1946/08/03, 77 y.o.   MRN: [id number]  Patient outreach attempt to follow-up after telephone visit earlier this week.  Consulted PCP, Aura Dials, and per her and cardiology notes, patient should be taking full tablet of clopiogrel 75mg  daily.  [facility] is working on [employer] for patient but need her to call them at [phone] M-F [phone] to verify benefits and shipping information.  They are able to bill to her part B, which will hopefully be cheaper than what she is currently paying at the retail pharmacy.  Unable to reach patient but left voicemail with my direct phone number.  Will also send MyChart message and try to reach patient again next week if I have not heard back.  Lenna Gilford, PharmD, DPLA

## 2023-04-21 ENCOUNTER — Other Ambulatory Visit: Payer: Self-pay | Admitting: Nurse Practitioner

## 2023-04-24 ENCOUNTER — Telehealth: Payer: Self-pay

## 2023-04-24 NOTE — Telephone Encounter (Signed)
Requested medication (s) are due for refill today -expired Rx  Requested medication (s) are on the active medication list -yes  Future visit scheduled -yes  Last refill: 02/23/22 #90 4RF  Notes to clinic: expired Rx  Requested Prescriptions  Pending Prescriptions Disp Refills   VITRON-C 65-125 MG TABS [Pharmacy Med Name: VITRON-C TABLET] 60 tablet 7    Sig: TAKE 1 TABLET BY MOUTH EVERY DAY     Endocrinology:  Vitamins - ascorbic acid / iron carbonyl Failed - 04/21/2023 10:54 PM      Failed - Ferritin in normal range and within 360 days    Ferritin  Date Value Ref Range Status  03/28/2022 39 15 - 150 ng/mL Final         Failed - Fe (serum) in normal range and within 360 days    Iron  Date Value Ref Range Status  03/28/2022 60 27 - 139 ug/dL Final   Iron Saturation  Date Value Ref Range Status  03/28/2022 18 15 - 55 % Final         Passed - RBC in normal range and within 360 days    RBC  Date Value Ref Range Status  10/16/2022 4.47 3.77 - 5.28 x10E6/uL Final         Passed - HCT in normal range and within 360 days    Hematocrit  Date Value Ref Range Status  10/16/2022 41.2 34.0 - 46.6 % Final         Passed - HGB in normal range and within 360 days    Hemoglobin  Date Value Ref Range Status  10/16/2022 14.2 11.1 - 15.9 g/dL Final         Passed - Valid encounter within last 12 months    Recent Outpatient Visits           1 month ago Type 2 diabetes mellitus with hyperglycemia, with long-term current use of insulin (HCC)   [place] [facility] [place], [place] T, NP   4 months ago Type 2 diabetes mellitus with hyperglycemia, with long-term current use of insulin (HCC)   [place] [facility] [place], Leroy T, NP   6 months ago LUQ abdominal pain   [place] [facility] [place], [place] T, NP   6 months ago LUQ abdominal pain   [place] [facility] [place], [place] T, NP   7 months ago Type 2 diabetes  mellitus with hyperglycemia, with long-term current use of insulin (HCC)   [place] [facility] [place], Dorie Rank, NP       Future Appointments             In 1 month [place], Dorie Rank, NP [place] [facility], [facility]   In 4 months Deirdre Evener, MD [place] [facility]               Requested Prescriptions  Pending Prescriptions Disp Refills   VITRON-C 65-125 MG TABS [Pharmacy Med Name: VITRON-C TABLET] 60 tablet 7    Sig: TAKE 1 TABLET BY MOUTH EVERY DAY     Endocrinology:  Vitamins - ascorbic acid / iron carbonyl Failed - 04/21/2023 10:54 PM      Failed - Ferritin in normal range and within 360 days    Ferritin  Date Value Ref Range Status  03/28/2022 39 15 - 150 ng/mL Final         Failed - Fe (serum) in normal range  and within 360 days    Iron  Date Value Ref Range Status  03/28/2022 60 27 - 139 ug/dL Final   Iron Saturation  Date Value Ref Range Status  03/28/2022 18 15 - 55 % Final         Passed - RBC in normal range and within 360 days    RBC  Date Value Ref Range Status  10/16/2022 4.47 3.77 - 5.28 x10E6/uL Final         Passed - HCT in normal range and within 360 days    Hematocrit  Date Value Ref Range Status  10/16/2022 41.2 34.0 - 46.6 % Final         Passed - HGB in normal range and within 360 days    Hemoglobin  Date Value Ref Range Status  10/16/2022 14.2 11.1 - 15.9 g/dL Final         Passed - Valid encounter within last 12 months    Recent Outpatient Visits           1 month ago Type 2 diabetes mellitus with hyperglycemia, with long-term current use of insulin (HCC)   [place] [facility] [place], [place] T, NP   4 months ago Type 2 diabetes mellitus with hyperglycemia, with long-term current use of insulin (HCC)   [place] [facility] [place], [place] T, NP   6 months ago LUQ abdominal pain   [place] [facility] [place], [place] T, NP   6  months ago LUQ abdominal pain   [place] [facility] [place], [place] T, NP   7 months ago Type 2 diabetes mellitus with hyperglycemia, with long-term current use of insulin (HCC)   [facility] [place], Dorie Rank, NP       Future Appointments             In 1 month [place], Dorie Rank, NP [place] [facility], [facility]   In 4 months Deirdre Evener, MD [facility]

## 2023-04-24 NOTE — Progress Notes (Signed)
   04/24/2023  Patient ID: Laura Rojas, female   DOB: Jun 06, 1946, 77 y.o.   MRN: [id number]  Patient contacted to inform me she will be getting Freestyle Libre through [facility] now at no cost to her.  No follow up scheduled at this time but can if needed per PCP.  Lenna Gilford, PharmD, DPLA

## 2023-05-01 DIAGNOSIS — M0609 Rheumatoid arthritis without rheumatoid factor, multiple sites: Secondary | ICD-10-CM | POA: Diagnosis not present

## 2023-05-06 ENCOUNTER — Other Ambulatory Visit: Payer: Self-pay | Admitting: Nurse Practitioner

## 2023-05-07 NOTE — Telephone Encounter (Signed)
Requested medication (s) are due for refill today: Yes  Requested medication (s) are on the active medication list: Yes  Last refill:  12/28/22  Future visit scheduled: Yes  Notes to clinic:  Per last OV note in April, 2024, change in dosage, will need new Rx sent to reflect current dosage, routing to provider.     Requested Prescriptions  Pending Prescriptions Disp Refills   NOVOLOG FLEXPEN 100 UNIT/ML FlexPen [Pharmacy Med Name: NOVOLOG 100 UNIT/ML FLEXPEN]  5    Sig: INJECT 10 UNITS INTO THE SKIN 3 (THREE) TIMES DAILY. INJECT PER SLIDING SCALE REGIMEN.     Endocrinology:  Diabetes - Insulins Failed - 05/06/2023  9:26 AM      Failed - HBA1C is between 0 and 7.9 and within 180 days    Hemoglobin A1C  Date Value Ref Range Status  12/31/2019 8.2  Final   HB A1C (BAYER DCA - WAIVED)  Date Value Ref Range Status  03/09/2023 8.0 (H) 4.8 - 5.6 % Final    Comment:             Prediabetes: 5.7 - 6.4          Diabetes: >6.4          Glycemic control for adults with diabetes: <7.0          Passed - Valid encounter within last 6 months    Recent Outpatient Visits           1 month ago Type 2 diabetes mellitus with hyperglycemia, with long-term current use of insulin (HCC)   [place] [facility] [place], [place] T, NP   5 months ago Type 2 diabetes mellitus with hyperglycemia, with long-term current use of insulin (HCC)   [place] [facility] [place], [place] T, NP   6 months ago LUQ abdominal pain   [place] [facility] [place], [place] T, NP   7 months ago LUQ abdominal pain   [place] [facility] [place], [place] T, NP   8 months ago Type 2 diabetes mellitus with hyperglycemia, with long-term current use of insulin (HCC)   [place] [facility] [place], Dorie Rank, NP       Future Appointments             In 1 month [place], Dorie Rank, NP [facility], [facility]   In 3 months  Deirdre Evener, MD [facility]

## 2023-05-09 ENCOUNTER — Telehealth: Payer: Self-pay | Admitting: Nurse Practitioner

## 2023-05-09 MED ORDER — NOVOLOG FLEXPEN 100 UNIT/ML ~~LOC~~ SOPN: PEN_INJECTOR | SUBCUTANEOUS | 8 refills | Status: DC

## 2023-05-09 NOTE — Telephone Encounter (Signed)
Pt called back reporting that she was told that she cannot receive her flexpen until 05/19/2023, she is afraid she is going to run out of her current supply. Please advise

## 2023-05-11 ENCOUNTER — Telehealth: Payer: Self-pay | Admitting: Nurse Practitioner

## 2023-05-11 ENCOUNTER — Other Ambulatory Visit: Payer: Self-pay | Admitting: Nurse Practitioner

## 2023-05-11 MED ORDER — NOVOLOG FLEXPEN 100 UNIT/ML ~~LOC~~ SOPN: PEN_INJECTOR | SUBCUTANEOUS | 8 refills | Status: DC

## 2023-05-11 NOTE — Telephone Encounter (Signed)
Patient may need change in dosing instructions- she is unable to get refill- see note

## 2023-05-11 NOTE — Telephone Encounter (Signed)
Unable to refill per protocol, Rx request is too soon. Last refill 05/09/23. Duplicate request.  Requested Prescriptions  Pending Prescriptions Disp Refills   Insulin Aspart FlexPen (NOVOLOG) 100 UNIT/ML [Pharmacy Med Name: INSULIN ASPART 100 UNIT/ML PEN]  1    Sig: INJECT 16 UNITS INTO THE SKIN 3 (THREE) TIMES DAILY. INJECT PER SLIDING SCALE REGIMEN.     Endocrinology:  Diabetes - Insulins Failed - 05/11/2023  9:14 AM      Failed - HBA1C is between 0 and 7.9 and within 180 days    Hemoglobin A1C  Date Value Ref Range Status  12/31/2019 8.2  Final   HB A1C (BAYER DCA - WAIVED)  Date Value Ref Range Status  03/09/2023 8.0 (H) 4.8 - 5.6 % Final    Comment:             Prediabetes: 5.7 - 6.4          Diabetes: >6.4          Glycemic control for adults with diabetes: <7.0          Passed - Valid encounter within last 6 months    Recent Outpatient Visits           2 months ago Type 2 diabetes mellitus with hyperglycemia, with long-term current use of insulin (HCC)   [place] [facility] [place], [place] T, NP   5 months ago Type 2 diabetes mellitus with hyperglycemia, with long-term current use of insulin (HCC)   [place] [facility] [place], [place] T, NP   6 months ago LUQ abdominal pain   [place] [facility] [place], [place] T, NP   7 months ago LUQ abdominal pain   [place] [facility] [place], [place] T, NP   8 months ago Type 2 diabetes mellitus with hyperglycemia, with long-term current use of insulin (HCC)   [place] [facility] [place], Dorie Rank, NP       Future Appointments             In 4 weeks Marjie Skiff, NP [place] [facility]   In 3 months Deirdre Evener, MD [facility]

## 2023-05-11 NOTE — Telephone Encounter (Signed)
Medication Refill - Medication: insulin aspart (NOVOLOG FLEXPEN) 100 UNIT/ML FlexPen   Pt stated the pharmacy advised her insurance won't pay for insulin yet due to Rx showing INJECT 10 UNITS INTO THE SKIN 3 (THREE) TIMES DAILY. Pt stated PCP advised her INJECT 16 UNITS INTO THE SKIN 3 (THREE) TIMES DAILY. However, she stated that she sometimes does 20 units, whatever she needs. Pt stated she has one pen left.  Please advise.  Has the patient contacted their pharmacy? Yes.    (Agent: If yes, when and what did the pharmacy advise?)  Preferred Pharmacy (with phone number or street name):  [id number] [place] [place] - [address]  [address] [place] [address]  Phone: [phone] Fax: [phone]  Hours: Not open 24 hours   Has the patient been seen for an appointment in the last year OR does the patient have an upcoming appointment? Yes.    Agent: Please be advised that RX refills may take up to 3 business days. We ask that you follow-up with your pharmacy.

## 2023-05-29 DIAGNOSIS — M0609 Rheumatoid arthritis without rheumatoid factor, multiple sites: Secondary | ICD-10-CM | POA: Diagnosis not present

## 2023-06-02 ENCOUNTER — Other Ambulatory Visit: Payer: Self-pay | Admitting: Nurse Practitioner

## 2023-06-02 ENCOUNTER — Other Ambulatory Visit: Payer: Self-pay | Admitting: Dermatology

## 2023-06-02 DIAGNOSIS — R21 Rash and other nonspecific skin eruption: Secondary | ICD-10-CM

## 2023-06-03 NOTE — Patient Instructions (Signed)
Be Involved in Caring For Your Health:  Taking Medications When medications are taken as directed, they can greatly improve your health. But if they are not taken as prescribed, they may not work. In some cases, not taking them correctly can be harmful. To help ensure your treatment remains effective and safe, understand your medications and how to take them. Bring your medications to each visit for review by your provider.  Your lab results, notes, and after visit summary will be available on My Chart. We strongly encourage you to use this feature. If lab results are abnormal the clinic will contact you with the appropriate steps. If the clinic does not contact you assume the results are satisfactory. You can always view your results on My Chart. If you have questions regarding your health or results, please contact the clinic during office hours. You can also ask questions on My Chart.  We at [facility] are grateful that you chose us to provide your care. We strive to provide evidence-based and compassionate care and are always looking for feedback. If you get a survey from the clinic please complete this so we can hear your opinions.  Diabetes Mellitus Basics  Diabetes mellitus, or diabetes, is a long-term (chronic) disease. It occurs when the body does not properly use sugar (glucose) that is released from food after you eat. Diabetes mellitus may be caused by one or both of these problems: Your pancreas does not make enough of a hormone called insulin. Your body does not react in a normal way to the insulin that it makes. Insulin lets glucose enter cells in your body. This gives you energy. If you have diabetes, glucose cannot get into cells. This causes high blood glucose (hyperglycemia). How to treat and manage diabetes You may need to take insulin or other diabetes medicines daily to keep your glucose in balance. If you are prescribed insulin, you will learn how to give  yourself insulin by injection. You may need to adjust the amount of insulin you take based on the foods that you eat. You will need to check your blood glucose levels using a glucose monitor as told by your health care provider. The readings can help determine if you have low or high blood glucose. Generally, you should have these blood glucose levels: Before meals (preprandial): 80-130 mg/dL (4.4-7.2 mmol/L). After meals (postprandial): below 180 mg/dL (10 mmol/L). Hemoglobin A1c (HbA1c) level: less than 7%. Your health care provider will set treatment goals for you. Keep all follow-up visits. This is important. Follow these instructions at home: Diabetes medicines Take your diabetes medicines every day as told by your health care provider. List your diabetes medicines here: Name of medicine: ______________________________ Amount (dose): _______________ Time (a.m./p.m.): _______________ Notes: ___________________________________ Name of medicine: ______________________________ Amount (dose): _______________ Time (a.m./p.m.): _______________ Notes: ___________________________________ Name of medicine: ______________________________ Amount (dose): _______________ Time (a.m./p.m.): _______________ Notes: ___________________________________ Insulin If you use insulin, list the types of insulin you use here: Insulin type: ______________________________ Amount (dose): _______________ Time (a.m./p.m.): _______________Notes: ___________________________________ Insulin type: ______________________________ Amount (dose): _______________ Time (a.m./p.m.): _______________ Notes: ___________________________________ Insulin type: ______________________________ Amount (dose): _______________ Time (a.m./p.m.): _______________ Notes: ___________________________________ Insulin type: ______________________________ Amount (dose): _______________ Time (a.m./p.m.): _______________ Notes:  ___________________________________ Insulin type: ______________________________ Amount (dose): _______________ Time (a.m./p.m.): _______________ Notes: ___________________________________ Managing blood glucose  Check your blood glucose levels using a glucose monitor as told by your health care provider. Write down the times that you check your glucose levels here: Time: _______________ Notes: ___________________________________   Time: _______________ Notes: ___________________________________ Time: _______________ Notes: ___________________________________ Time: _______________ Notes: ___________________________________ Time: _______________ Notes: ___________________________________ Time: _______________ Notes: ___________________________________  Low blood glucose Low blood glucose (hypoglycemia) is when glucose is at or below 70 mg/dL (3.9 mmol/L). Symptoms may include: Feeling: Hungry. Sweaty and clammy. Irritable or easily upset. Dizzy. Sleepy. Having: A fast heartbeat. A headache. A change in your vision. Numbness around the mouth, lips, or tongue. Having trouble with: Moving (coordination). Sleeping. Treating low blood glucose To treat low blood glucose, eat or drink something containing sugar right away. If you can think clearly and swallow safely, follow the 15:15 rule: Take 15 grams of a fast-acting carb (carbohydrate), as told by your health care provider. Some fast-acting carbs are: Glucose tablets: take 3-4 tablets. Hard candy: eat 3-5 pieces. Fruit juice: drink 4 oz (120 mL). Regular (not diet) soda: drink 4-6 oz (120-180 mL). Honey or sugar: eat 1 Tbsp (15 mL). Check your blood glucose levels 15 minutes after you take the carb. If your glucose is still at or below 70 mg/dL (3.9 mmol/L), take 15 grams of a carb again. If your glucose does not go above 70 mg/dL (3.9 mmol/L) after 3 tries, get help right away. After your glucose goes back to normal, eat a meal  or a snack within 1 hour. Treating very low blood glucose If your glucose is at or below 54 mg/dL (3 mmol/L), you have very low blood glucose (severe hypoglycemia). This is an emergency. Do not wait to see if the symptoms will go away. Get medical help right away. Call your local emergency services (911 in the [place] Do not drive yourself to the hospital. Questions to ask your health care provider Should I talk with a diabetes educator? What equipment will I need to care for myself at home? What diabetes medicines do I need? When should I take them? How often do I need to check my blood glucose levels? What number can I call if I have questions? When is my follow-up visit? Where can I find a support group for people with diabetes? Where to find more information American Diabetes Association: www.diabetes.org Association of Diabetes Care and Education Specialists: www.diabeteseducator.org Contact a health care provider if: Your blood glucose is at or above 240 mg/dL (13.3 mmol/L) for 2 days in a row. You have been sick or have had a fever for 2 days or more, and you are not getting better. You have any of these problems for more than 6 hours: You cannot eat or drink. You feel nauseous. You vomit. You have diarrhea. Get help right away if: Your blood glucose is lower than 54 mg/dL (3 mmol/L). You get confused. You have trouble thinking clearly. You have trouble breathing. These symptoms may represent a serious problem that is an emergency. Do not wait to see if the symptoms will go away. Get medical help right away. Call your local emergency services (911 in the [place] Do not drive yourself to the hospital. Summary Diabetes mellitus is a chronic disease that occurs when the body does not properly use sugar (glucose) that is released from food after you eat. Take insulin and diabetes medicines as told. Check your blood glucose every day, as often as told. Keep all follow-up visits. This  is important. This information is not intended to replace advice given to you by your health care provider. Make sure you discuss any questions you have with your health care provider. Document Revised: 03/16/2020 Document Reviewed: 03/16/2020 Elsevier Patient Education    2024 Elsevier Inc.  

## 2023-06-04 NOTE — Telephone Encounter (Signed)
Requested medication (s) are due for refill today: yes  Requested medication (s) are on the active medication list: yes  Last refill:  03/09/23  Future visit scheduled: yes  Notes to clinic:  Unable to refill per protocol, cannot delegate.      Requested Prescriptions  Pending Prescriptions Disp Refills   LORazepam (ATIVAN) 0.5 MG tablet [Pharmacy Med Name: LORAZEPAM 0.5 MG TABLET] 180 tablet 0    Sig: TAKE 1 TAB BY MOUTH TWICE A DAY (USE THE ADDITIONAL 30 TABS ONLY SPARINGLY & ONLY FOR SEVERE ANXIETY)     Not Delegated - Psychiatry: Anxiolytics/Hypnotics 2 Failed - 06/02/2023  9:27 PM      Failed - This refill cannot be delegated      Failed - Urine Drug Screen completed in last 360 days      Passed - Patient is not pregnant      Passed - Valid encounter within last 6 months    Recent Outpatient Visits           2 months ago Type 2 diabetes mellitus with hyperglycemia, with long-term current use of insulin (HCC)   [place] [facility] [place], [place] T, NP   5 months ago Type 2 diabetes mellitus with hyperglycemia, with long-term current use of insulin (HCC)   [place] [facility] [place], Joyce T, NP   7 months ago LUQ abdominal pain   [place] [facility] [place], Danielson T, NP   7 months ago LUQ abdominal pain   [place] [facility] [place], [place] T, NP   9 months ago Type 2 diabetes mellitus with hyperglycemia, with long-term current use of insulin (HCC)   [place] [facility] [place], Dorie Rank, NP       Future Appointments             In 4 days Marjie Skiff, NP [place] [facility]   In 2 months Deirdre Evener, MD [facility]            Signed Prescriptions Disp Refills   pantoprazole (PROTONIX) 40 MG tablet 90 tablet 0    Sig: TAKE 1 TABLET BY MOUTH EVERY DAY     Gastroenterology: Proton Pump Inhibitors Passed - 06/02/2023  9:27  PM      Passed - Valid encounter within last 12 months    Recent Outpatient Visits           2 months ago Type 2 diabetes mellitus with hyperglycemia, with long-term current use of insulin (HCC)   [place] [facility] [place], [place] T, NP   5 months ago Type 2 diabetes mellitus with hyperglycemia, with long-term current use of insulin (HCC)   [facility] [place], [place] T, NP   7 months ago LUQ abdominal pain   [place] [facility] [place], Eleva T, NP   7 months ago LUQ abdominal pain   [place] [facility] [place], [place] T, NP   9 months ago Type 2 diabetes mellitus with hyperglycemia, with long-term current use of insulin (HCC)   [place] [facility] [place], Dorie Rank, NP       Future Appointments             In 4 days Marjie Skiff, NP  [facility]   In 2 months Deirdre Evener, MD [facility]

## 2023-06-04 NOTE — Telephone Encounter (Signed)
Requested Prescriptions  Pending Prescriptions Disp Refills   LORazepam (ATIVAN) 0.5 MG tablet [Pharmacy Med Name: LORAZEPAM 0.5 MG TABLET] 180 tablet 0    Sig: TAKE 1 TAB BY MOUTH TWICE A DAY (USE THE ADDITIONAL 30 TABS ONLY SPARINGLY & ONLY FOR SEVERE ANXIETY)     Not Delegated - Psychiatry: Anxiolytics/Hypnotics 2 Failed - 06/02/2023  9:27 PM      Failed - This refill cannot be delegated      Failed - Urine Drug Screen completed in last 360 days      Passed - Patient is not pregnant      Passed - Valid encounter within last 6 months    Recent Outpatient Visits           2 months ago Type 2 diabetes mellitus with hyperglycemia, with long-term current use of insulin (HCC)   [facility] [place], Gideon T, NP   5 months ago Type 2 diabetes mellitus with hyperglycemia, with long-term current use of insulin (HCC)   [place] [facility] [place], Carney T, NP   7 months ago LUQ abdominal pain   [place] [facility] [place], [place] T, NP   7 months ago LUQ abdominal pain   [place] [facility] [place], [place] T, NP   9 months ago Type 2 diabetes mellitus with hyperglycemia, with long-term current use of insulin (HCC)   [place] [facility] [place], Dorie Rank, NP       Future Appointments             In 4 days Marjie Skiff, NP [facility]   In 2 months Deirdre Evener, MD [place] [facility]             pantoprazole (PROTONIX) 40 MG tablet [Pharmacy Med Name: PANTOPRAZOLE SOD DR 40 MG TAB] 90 tablet 0    Sig: TAKE 1 TABLET BY MOUTH EVERY DAY     Gastroenterology: Proton Pump Inhibitors Passed - 06/02/2023  9:27 PM      Passed - Valid encounter within last 12 months    Recent Outpatient Visits           2 months ago Type 2 diabetes mellitus with hyperglycemia, with long-term current use of insulin (HCC)   [place] [facility]  [place], [place] T, NP   5 months ago Type 2 diabetes mellitus with hyperglycemia, with long-term current use of insulin (HCC)   [place] [facility] [place], [place] T, NP   7 months ago LUQ abdominal pain   [place] [facility] [place], [place] T, NP   7 months ago LUQ abdominal pain   [place] [facility] [place], [place] T, NP   9 months ago Type 2 diabetes mellitus with hyperglycemia, with long-term current use of insulin (HCC)   [place] [facility] [place], Dorie Rank, NP       Future Appointments             In 4 days Marjie Skiff, NP  [facility], [facility]   In 2 months Deirdre Evener, MD [facility]

## 2023-06-08 ENCOUNTER — Ambulatory Visit (INDEPENDENT_AMBULATORY_CARE_PROVIDER_SITE_OTHER): Payer: Medicare Other | Admitting: Nurse Practitioner

## 2023-06-08 ENCOUNTER — Encounter: Payer: Self-pay | Admitting: Nurse Practitioner

## 2023-06-08 VITALS — BP 132/82 | HR 54 | Temp 97.9°F | Ht 65.0 in | Wt 153.8 lb

## 2023-06-08 DIAGNOSIS — I25708 Atherosclerosis of coronary artery bypass graft(s), unspecified, with other forms of angina pectoris: Secondary | ICD-10-CM | POA: Diagnosis not present

## 2023-06-08 DIAGNOSIS — E1169 Type 2 diabetes mellitus with other specified complication: Secondary | ICD-10-CM

## 2023-06-08 DIAGNOSIS — Z79899 Other long term (current) drug therapy: Secondary | ICD-10-CM | POA: Diagnosis not present

## 2023-06-08 DIAGNOSIS — I771 Stricture of artery: Secondary | ICD-10-CM | POA: Diagnosis not present

## 2023-06-08 DIAGNOSIS — F339 Major depressive disorder, recurrent, unspecified: Secondary | ICD-10-CM

## 2023-06-08 DIAGNOSIS — M353 Polymyalgia rheumatica: Secondary | ICD-10-CM

## 2023-06-08 DIAGNOSIS — Z794 Long term (current) use of insulin: Secondary | ICD-10-CM

## 2023-06-08 DIAGNOSIS — K7460 Unspecified cirrhosis of liver: Secondary | ICD-10-CM | POA: Diagnosis not present

## 2023-06-08 DIAGNOSIS — F5101 Primary insomnia: Secondary | ICD-10-CM | POA: Diagnosis not present

## 2023-06-08 DIAGNOSIS — E1165 Type 2 diabetes mellitus with hyperglycemia: Secondary | ICD-10-CM | POA: Diagnosis not present

## 2023-06-08 DIAGNOSIS — R748 Abnormal levels of other serum enzymes: Secondary | ICD-10-CM

## 2023-06-08 DIAGNOSIS — N183 Chronic kidney disease, stage 3 unspecified: Secondary | ICD-10-CM | POA: Diagnosis not present

## 2023-06-08 DIAGNOSIS — F413 Other mixed anxiety disorders: Secondary | ICD-10-CM | POA: Diagnosis not present

## 2023-06-08 DIAGNOSIS — I152 Hypertension secondary to endocrine disorders: Secondary | ICD-10-CM | POA: Diagnosis not present

## 2023-06-08 DIAGNOSIS — E1122 Type 2 diabetes mellitus with diabetic chronic kidney disease: Secondary | ICD-10-CM

## 2023-06-08 DIAGNOSIS — E1159 Type 2 diabetes mellitus with other circulatory complications: Secondary | ICD-10-CM

## 2023-06-08 DIAGNOSIS — E785 Hyperlipidemia, unspecified: Secondary | ICD-10-CM | POA: Diagnosis not present

## 2023-06-08 LAB — BAYER DCA HB A1C WAIVED: HB A1C (BAYER DCA - WAIVED): 7.5 % — ABNORMAL HIGH (ref 4.8–5.6)

## 2023-06-08 NOTE — Assessment & Plan Note (Signed)
Chronic, stable.  BP at goal for age.  Continue current medication regimen and collaboration with cardiology. Labs: CMP.  Recommend she monitor BP at least three days a week at home and document for providers + bring to visits.  DASH diet focus.  Return in 3 months.

## 2023-06-08 NOTE — Assessment & Plan Note (Signed)
Chronic, ongoing.  Denies SI/HI.  Continue Sertraline and Ativan.  Has had multiple attempts at reduction of benzo with no success.  Discussed at length risks with her and she is aware.  Continue Ativan and fill upon request.  PDMP reviewed.  Return in 3 months, UDS due next 06/07/24 and contract on file.

## 2023-06-08 NOTE — Assessment & Plan Note (Signed)
Chronic, stable Stage 3a.  Continue Losartan for kidney protection.  Urine ALB 26 December 2022. Continue collaboration with cardiology.  Consider nephrology referral if worsening function.  Labs: CMP. 

## 2023-06-08 NOTE — Assessment & Plan Note (Signed)
Chronic, ongoing.  Continue current medication regimen and adjust as needed.  Lipid panel today.  Discussed with patient, if myalgias with Atorvastatin, + her history of myalgia with statins, could consider Repatha initiation if poor tolerance. 

## 2023-06-08 NOTE — Assessment & Plan Note (Signed)
Chronic, ongoing for insomnia.  Has had trials of other medications and reductions with poor outcomes.  Fully aware of risks of chronic benzo use and aware this will be discussed each visit.  UDS obtained today (due next 06/07/24) and contract on file.

## 2023-06-08 NOTE — Assessment & Plan Note (Addendum)
Ongoing, will continue collaboration with GI at this time.  Remain of Januvia and GLP1.  Recent notes reviewed. Recent GI notes reviewed,

## 2023-06-08 NOTE — Assessment & Plan Note (Signed)
Continue collaboration with GI -- recheck levels today and remain of GLP1 and Januvia. 

## 2023-06-08 NOTE — Assessment & Plan Note (Signed)
Chronic, stable.  Continue collaboration with cardiology and preventative treatment, including statin and ASA. 

## 2023-06-08 NOTE — Assessment & Plan Note (Signed)
Chronic, ongoing.  Denies SI/HI.  Continue Sertraline 150 MG daily as is ordered. This is offering benefit to mood overall and may help reduce benzo use.  Consider increase to 200 MG in future for increased benefit.  Return in 3 months. 

## 2023-06-08 NOTE — Assessment & Plan Note (Signed)
Chronic, ongoing.  Continue collaboration with rheumatology and current medication regimen.  Recent notes reviewed. 

## 2023-06-08 NOTE — Assessment & Plan Note (Signed)
Chronic, ongoing, followed by endocrinology in past.  A1c 7.5% today, trend down (lowest level in 2 years) and at goal for age per geriatric society, remaining well below her previous levels in 9-10 range.  She wishes to avoid return to endo at this time as her previous provider left.  Will continue Basaglar but increase slightly to 16 units + cut back on her sliding scale with this if needed, continue Metformin.  Continue collaboration with CCM team in office.   Bring Libre to next visit.  Return to office in 3 months -- goal to avoid hypoglycemia.  If ongoing poor control will get back into endo. - Eye and foot exam up to date - Statin on board.  ARB on board.

## 2023-06-08 NOTE — Progress Notes (Signed)
BP 132/82   Pulse (!) 54   Temp 97.9 F (36.6 C) (Oral)   Ht 5\' 5"  (1.651 m)   Wt 153 lb 12.8 oz (69.8 kg)   SpO2 97%   BMI 25.59 kg/m    Subjective:    Patient ID: Laura Rojas, female    DOB: 1946-01-22, 77 y.o.   MRN: [id number]  HPI: Laura Rojas is a 77 y.o. female  Chief Complaint  Patient presents with   Diabetes   Hypertension   Hyperlipidemia   Mood   RA   DIABETES A1c 8% April. She continues on Basaglar, Novolog sliding scale, and Metformin.  Does endorse poor diet due to her birthday month and people taking her out to eat.    Had to stop Januvia recently due to pancreatitis and elevation in Lipase and Amylase -- had a visit with GI on 02/06/23 (Dr. Norma Fredrickson), they offered EGD and colonoscopy but she wanted to hold off on this.  She is to return in 6 months to check on cirrhosis.  Followed by endocrinology in past, last saw NP [facility] 05/16/21, has not returned as NP no longer at the practice and she prefers to stay with PCP.   She did not tolerate GLP1 (Trulicity) in past and had side effects with [place].   Hypoglycemic episodes: had one level that was 40 Polydipsia/polyuria: no Visual disturbance: no Chest pain: no Paresthesias: no Glucose Monitoring: yes             Accucheck frequency: Freestyle -- 40 to 300 -- did not bring in today             Fasting glucose:              Post prandial:             Evening:              Before meals: Taking Insulin?: yes             Long acting insulin: 13 - 16 units often at night             Short acting insulin: sliding scale units with meals (20 units) Blood Pressure Monitoring: rarely Retinal Examination: Up to Date -- [facility] Foot Exam: Up to Date Pneumovax: Up to Date Influenza: Up to Date Aspirin: yes   HYPERTENSION / HYPERLIPIDEMIA/CAD Continues on Amlodipine, Plavix, HCTZ, Fish oil, Losartan, Lipitor. Dr. Royann Shivers, last saw 12/04/22, is to visit every 6 months. She is followed due to subclavian  artery stenosis and angina. Satisfied with current treatment? yes Duration of hypertension: chronic BP monitoring frequency: not checking BP range:  BP medication side effects: no Duration of hyperlipidemia: chronic Cholesterol medication side effects: no Cholesterol supplements: fish oil Medication compliance: good compliance Aspirin: yes Recent stressors: no Recurrent headaches: no Visual changes: no Palpitations: no Dyspnea: no Chest pain: no Lower extremity edema: no Dizzy/lightheaded: no   CHRONIC KIDNEY DISEASE Stable on recent labs, Stage 3a. CKD status: stable Medications renally dose: yes Previous renal evaluation: no Pneumovax:  Up to Date Influenza Vaccine:  Up to Date    POLYMYALGIA RHEUMATICA & RLS Diagnosis of polymyalgia rheumatica syndrome. Saw rheumatology last 03/01/23, getting injections at this time, last dose of Cimzia was 05/29/23. Duration: chronic Involved joints: right hand, both feet, and left knee Mechanism of injury: arthritis Severity: 0/10 today Quality:  dull, aching and burning Frequency: intermittent  Radiation: no Aggravating factors: walking and movement  Alleviating factors:  Aspercreme and APAP  Status: stable Treatments attempted: Tylenol and Aspercreme Relief with NSAIDs?:  No NSAIDs Taken Weakness with weight bearing or walking: no Sensation of giving way: no Locking: no Popping: no Bruising: no Swelling: no Redness: no Paresthesias/decreased sensation: no Fevers: no  ANXIETY/STRESS Continues Zoloft 150 MG daily.  Continues Ativan 0.5 MG, takes this at night only, although ordered BID -- she often will take two every night (1 MG) - - started by previous PCP. Pt aware of risks of benzo medication use to include increased sedation, respiratory suppression, falls, dependence and cardiovascular events.  Pt would like to continue treatment as benefit determined to outweigh risk. On review PDMP last Ativan fill 03/12/23 for 3 months  supply, previous PCP ordered this way. Duration:stable Anxious mood: no Excessive worrying: no Irritability: no  Sweating: no Nausea: no Palpitations:no Hyperventilation: no Panic attacks: no Agoraphobia: no  Obscessions/compulsions: no Depressed mood: no    06/08/2023   11:15 AM 03/09/2023   11:00 AM 12/08/2022   11:07 AM 10/16/2022    3:30 PM 09/07/2022    9:24 AM  Depression screen PHQ 2/9  Decreased Interest 0 0 0 0 0  Down, Depressed, Hopeless 0 0 0 0 0  PHQ - 2 Score 0 0 0 0 0  Altered sleeping 2 1 1 1 1   Tired, decreased energy 1 1 0 1 0  Change in appetite 1 1 0 0 0  Feeling bad or failure about yourself  0 0 0 0 0  Trouble concentrating 0 0 0 0 0  Moving slowly or fidgety/restless 0 0 0 0 0  Suicidal thoughts 0 0 0 0 0  PHQ-9 Score 4 3 1 2 1   Difficult doing work/chores Not difficult at all Not difficult at all Not difficult at all Not difficult at all Not difficult at all      06/08/2023   11:15 AM 03/09/2023   11:00 AM 12/08/2022   11:08 AM 10/16/2022    3:30 PM  GAD 7 : Generalized Anxiety Score  Nervous, Anxious, on Edge 0 0 0 0  Control/stop worrying 0 0 0 0  Worry too much - different things 0 0 0 0  Trouble relaxing 0 0 0 0  Restless 0 0 0 0  Easily annoyed or irritable 0 0 0 0  Afraid - awful might happen 0 0 0 0  Total GAD 7 Score 0 0 0 0  Anxiety Difficulty Not difficult at all Not difficult at all Not difficult at all Not difficult at all      Relevant past medical, surgical, family and social history reviewed and updated as indicated. Interim medical history since our last visit reviewed. Allergies and medications reviewed and updated.  Review of Systems  Constitutional:  Negative for activity change, appetite change, diaphoresis, fatigue and fever.  Respiratory:  Negative for cough, chest tightness and shortness of breath.   Cardiovascular:  Negative for chest pain, palpitations and leg swelling.  Gastrointestinal: Negative.   Endocrine:  Negative for cold intolerance, heat intolerance, polydipsia, polyphagia and polyuria.  Neurological:  Negative for dizziness, syncope, weakness, light-headedness, numbness and headaches.  Psychiatric/Behavioral: Negative.      Per HPI unless specifically indicated above     Objective:    BP 132/82   Pulse (!) 54   Temp 97.9 F (36.6 C) (Oral)   Ht 5\' 5"  (1.651 m)   Wt 153 lb 12.8 oz (69.8 kg)   SpO2 97%   BMI 25.59  kg/m   Wt Readings from Last 3 Encounters:  06/08/23 153 lb 12.8 oz (69.8 kg)  03/09/23 154 lb 6.4 oz (70 kg)  12/08/22 152 lb 9.6 oz (69.2 kg)    Physical Exam Vitals and nursing note reviewed.  Constitutional:      General: She is awake. She is not in acute distress.    Appearance: She is well-developed, well-groomed and overweight. She is not ill-appearing.  HENT:     Head: Normocephalic.     Right Ear: Hearing normal.     Left Ear: Hearing normal.  Eyes:     General: Lids are normal.        Right eye: No discharge.        Left eye: No discharge.     Conjunctiva/sclera: Conjunctivae normal.     Pupils: Pupils are equal, round, and reactive to light.  Neck:     Thyroid: No thyromegaly.     Vascular: Carotid bruit (bilateral) present.  Cardiovascular:     Rate and Rhythm: Normal rate and regular rhythm.     Heart sounds: Normal heart sounds. No murmur heard.    No gallop.  Pulmonary:     Effort: Pulmonary effort is normal. No accessory muscle usage or respiratory distress.     Breath sounds: No wheezing or rhonchi.  Abdominal:     General: Bowel sounds are normal.     Palpations: Abdomen is soft.  Musculoskeletal:     Cervical back: Normal range of motion and neck supple.     Right lower leg: No edema.     Left lower leg: No edema.  Skin:    General: Skin is warm and dry.  Neurological:     Mental Status: She is alert and oriented to person, place, and time.  Psychiatric:        Attention and Perception: Attention normal.        Mood and  Affect: Mood normal.        Speech: Speech normal.        Behavior: Behavior normal. Behavior is cooperative.        Thought Content: Thought content normal.    Results for orders placed or performed in visit on 03/12/23  HM DIABETES EYE EXAM  Result Value Ref Range   HM Diabetic Eye Exam Retinopathy (A) No Retinopathy      Assessment & Plan:   Problem List Items Addressed This Visit       Cardiovascular and Mediastinum   Coronary artery disease of bypass graft of native heart with stable angina pectoris (HCC)    Chronic, stable.  Continue collaboration with cardiology and preventative treatment, including statin and ASA.      Relevant Orders   Comprehensive metabolic panel   Lipid Panel w/o Chol/HDL Ratio   Hypertension associated with diabetes (HCC)    Chronic, stable.  BP at goal for age.  Continue current medication regimen and collaboration with cardiology. Labs: CMP.  Recommend she monitor BP at least three days a week at home and document for providers + bring to visits.  DASH diet focus.  Return in 3 months.      Relevant Orders   Bayer DCA Hb A1c Waived   Comprehensive metabolic panel   Subclavian artery stenosis, left (HCC)    Stable, continue collaboration with cardiology and preventative treatment to include ASA, Plavix, and statin.      Relevant Orders   Comprehensive metabolic panel   Lipid Panel w/o Chol/HDL  Ratio     Digestive   Cirrhosis, non-alcoholic (HCC)    Ongoing, will continue collaboration with GI at this time.  Remain of Januvia and GLP1.  Recent notes reviewed. Recent GI notes reviewed,       Relevant Orders   Comprehensive metabolic panel     Endocrine   CKD stage 3 due to type 2 diabetes mellitus (HCC)    Chronic, stable Stage 3a.  Continue Losartan for kidney protection.  Urine ALB 26 December 2022. Continue collaboration with cardiology.  Consider nephrology referral if worsening function.  Labs: CMP.      Relevant Orders   Bayer DCA  Hb A1c Waived   Comprehensive metabolic panel   Hyperlipidemia associated with type 2 diabetes mellitus (HCC)    Chronic, ongoing.  Continue current medication regimen and adjust as needed.  Lipid panel today.  Discussed with patient, if myalgias with Atorvastatin, + her history of myalgia with statins, could consider Repatha initiation if poor tolerance.      Relevant Orders   Bayer DCA Hb A1c Waived   Comprehensive metabolic panel   Lipid Panel w/o Chol/HDL Ratio   Type 2 diabetes mellitus with hyperglycemia, with long-term current use of insulin (HCC) - Primary    Chronic, ongoing, followed by endocrinology in past.  A1c 7.5% today, trend down (lowest level in 2 years) and at goal for age per geriatric society, remaining well below her previous levels in 9-10 range.  She wishes to avoid return to endo at this time as her previous provider left.  Will continue Basaglar but increase slightly to 16 units + cut back on her sliding scale with this if needed, continue Metformin.  Continue collaboration with CCM team in office.   Bring Libre to next visit.  Return to office in 3 months -- goal to avoid hypoglycemia.  If ongoing poor control will get back into endo. - Eye and foot exam up to date - Statin on board.  ARB on board.      Relevant Orders   Bayer DCA Hb A1c Waived     Other   Anxiety disorder    Chronic, ongoing.  Denies SI/HI.  Continue Sertraline and Ativan.  Has had multiple attempts at reduction of benzo with no success.  Discussed at length risks with her and she is aware.  Continue Ativan and fill upon request.  PDMP reviewed.  Return in 3 months, UDS due next 06/07/24 and contract on file.      Relevant Orders   [id number] 11+Oxyco+Alc+Crt-Bund   Depression, recurrent (HCC)    Chronic, ongoing.  Denies SI/HI.  Continue Sertraline 150 MG daily as is ordered. This is offering benefit to mood overall and may help reduce benzo use.  Consider increase to 200 MG in future for increased  benefit.  Return in 3 months.      Relevant Orders   [id number] 11+Oxyco+Alc+Crt-Bund   Elevated amylase and lipase    Continue collaboration with GI -- recheck levels today and remain of GLP1 and Januvia.      Relevant Orders   Amylase   Lipase   Insomnia    Chronic, ongoing with use of Lorazepam nightly.  Has had trial reductions in past and other medications without success.  Is fully aware of risks of chronic benzo use and aware this will be discussed each visit.  Return in 3 months.  UDS due next 06/07/24, contract up to date.      Relevant Orders  [id number] 11+Oxyco+Alc+Crt-Bund   Long-term current use of benzodiazepine    Chronic, ongoing for insomnia.  Has had trials of other medications and reductions with poor outcomes.  Fully aware of risks of chronic benzo use and aware this will be discussed each visit.  UDS obtained today (due next 06/07/24) and contract on file.      Relevant Orders   [id number] 11+Oxyco+Alc+Crt-Bund   Polymyalgia rheumatica (HCC)    Chronic, ongoing.  Continue collaboration with rheumatology and current medication regimen.  Recent notes reviewed.        Follow up plan: Return in about 3 months (around 09/08/2023) for T2DM, HTN/HLD, MOOD, RA, CKD, RLS.

## 2023-06-08 NOTE — Assessment & Plan Note (Signed)
Stable, continue collaboration with cardiology and preventative treatment to include ASA, Plavix, and statin. 

## 2023-06-08 NOTE — Assessment & Plan Note (Signed)
Chronic, ongoing with use of Lorazepam nightly.  Has had trial reductions in past and other medications without success.  Is fully aware of risks of chronic benzo use and aware this will be discussed each visit.  Return in 3 months.  UDS due next 06/07/24, contract up to date.

## 2023-06-09 LAB — LIPID PANEL W/O CHOL/HDL RATIO
Cholesterol, Total: 164 mg/dL (ref 100–199)
HDL: 63 mg/dL (ref >39)
LDL Chol Calc (NIH): 64 mg/dL (ref 0–99)
Triglycerides: 230 mg/dL — ABNORMAL HIGH (ref 0–149)
VLDL Cholesterol Cal: 37 mg/dL (ref 5–40)

## 2023-06-09 LAB — COMPREHENSIVE METABOLIC PANEL
ALT: 22 IU/L (ref 0–32)
AST: 22 IU/L (ref 0–40)
Albumin: 4.5 g/dL (ref 3.8–4.8)
Alkaline Phosphatase: 138 IU/L — ABNORMAL HIGH (ref 44–121)
BUN/Creatinine Ratio: 19 (ref 12–28)
BUN: 19 mg/dL (ref 8–27)
Bilirubin Total: 0.4 mg/dL (ref 0.0–1.2)
CO2: 28 mmol/L (ref 20–29)
Calcium: 10.2 mg/dL (ref 8.7–10.3)
Chloride: 102 mmol/L (ref 96–106)
Creatinine, Ser: 0.99 mg/dL (ref 0.57–1.00)
Globulin, Total: 2.2 g/dL (ref 1.5–4.5)
Glucose: 140 mg/dL — ABNORMAL HIGH (ref 70–99)
Potassium: 4.1 mmol/L (ref 3.5–5.2)
Sodium: 141 mmol/L (ref 134–144)
Total Protein: 6.7 g/dL (ref 6.0–8.5)
eGFR: 58 mL/min/{1.73_m2} — ABNORMAL LOW (ref >59)

## 2023-06-09 LAB — AMYLASE: Amylase: 95 U/L (ref 31–110)

## 2023-06-09 LAB — LIPASE: Lipase: 111 U/L — ABNORMAL HIGH (ref 14–85)

## 2023-06-09 NOTE — Progress Notes (Signed)
Contacted via MyChart   Good morning Laura Rojas, your labs have returned: - Kidney function, creatinine and eGFR, shows mild reduction in eGFR and normal creatinine.  We will continue to monitor this.  Ensure good water intake at home and no Ibuprofen.  Liver function, AST and ALT, is normal. - Pancreas labs, amylase and lipase, are trending down.  Good news. - Cholesterol labs show stable LDL, but triglycerides remain a bit elevated.  Come fasting next visit and we will recheck.  Continue all current medications at this time.  Any questions? Keep being stellar!!  Thank you for allowing me to participate in your care.  I appreciate you. Kindest regards, Dacotah Cabello

## 2023-06-10 LAB — DRUG SCREEN 764883 11+OXYCO+ALC+CRT-BUND
Amphetamines, Urine: NEGATIVE ng/mL
BENZODIAZ UR QL: NEGATIVE ng/mL
Barbiturate: NEGATIVE ng/mL
Cannabinoid Quant, Ur: NEGATIVE ng/mL
Cocaine (Metabolite): NEGATIVE ng/mL
Creatinine: 131.6 mg/dL (ref 20.0–300.0)
Ethanol: NEGATIVE %
Meperidine: NEGATIVE ng/mL
Methadone Screen, Urine: NEGATIVE ng/mL
OPIATE SCREEN URINE: NEGATIVE ng/mL
Oxycodone/Oxymorphone, Urine: NEGATIVE ng/mL
Phencyclidine: NEGATIVE ng/mL
Propoxyphene: NEGATIVE ng/mL
Tramadol: NEGATIVE ng/mL
pH, Urine: 6.5 (ref 4.5–8.9)

## 2023-07-03 DIAGNOSIS — M0609 Rheumatoid arthritis without rheumatoid factor, multiple sites: Secondary | ICD-10-CM | POA: Diagnosis not present

## 2023-07-03 DIAGNOSIS — M159 Polyosteoarthritis, unspecified: Secondary | ICD-10-CM | POA: Diagnosis not present

## 2023-07-03 DIAGNOSIS — Z79899 Other long term (current) drug therapy: Secondary | ICD-10-CM | POA: Diagnosis not present

## 2023-07-11 ENCOUNTER — Telehealth: Payer: Self-pay | Admitting: Nurse Practitioner

## 2023-07-11 MED ORDER — NOVOLOG FLEXPEN 100 UNIT/ML ~~LOC~~ SOPN: PEN_INJECTOR | SUBCUTANEOUS | 8 refills | Status: DC

## 2023-07-11 NOTE — Telephone Encounter (Signed)
Pt is on her last novolog pen.  Cannot get refill until 8/21.  Would like to know if you have sample or can approve early refill?  [facility]

## 2023-07-11 NOTE — Telephone Encounter (Signed)
Called and LVM letting patient know that Jolene has sent in a prescription for early fill for her to pick up.

## 2023-07-12 MED ORDER — NOVOLOG FLEXPEN 100 UNIT/ML ~~LOC~~ SOPN: PEN_INJECTOR | SUBCUTANEOUS | 8 refills | Status: DC

## 2023-07-12 NOTE — Telephone Encounter (Addendum)
Patient called in and stated that her Novolog pen was sent in to the pharmacy but only a 1 month supply was sent in and she needs a 3 month (90 day supply) sent in of her Novolog. Patient stated she could get this for $5 more if sent in. Please advise.   Patients callback # [phone] OR [phone]

## 2023-07-12 NOTE — Telephone Encounter (Signed)
Called and LVM notifying patient that a new prescription was sent in for her.

## 2023-07-12 NOTE — Addendum Note (Signed)
Addended by: Aura Dials T on: 07/12/2023 12:28 PM   Modules accepted: Orders

## 2023-07-31 DIAGNOSIS — M0609 Rheumatoid arthritis without rheumatoid factor, multiple sites: Secondary | ICD-10-CM | POA: Diagnosis not present

## 2023-08-01 ENCOUNTER — Other Ambulatory Visit: Payer: Self-pay | Admitting: Dermatology

## 2023-08-01 DIAGNOSIS — L2089 Other atopic dermatitis: Secondary | ICD-10-CM

## 2023-08-10 ENCOUNTER — Other Ambulatory Visit: Payer: Self-pay | Admitting: Nurse Practitioner

## 2023-08-10 DIAGNOSIS — E1165 Type 2 diabetes mellitus with hyperglycemia: Secondary | ICD-10-CM

## 2023-08-13 NOTE — Telephone Encounter (Signed)
Requested Prescriptions  Pending Prescriptions Disp Refills   Insulin Pen Needle (BD PEN NEEDLE NANO 2ND GEN) 32G X 4 MM MISC [Pharmacy Med Name: BD NANO 2 GEN PEN NDL 32G 4MM] 400 each 0    Sig: USE 1 EACH 4 (FOUR) TIMES DAILY     Endocrinology: Diabetes - Testing Supplies Passed - 08/10/2023  9:38 PM      Passed - Valid encounter within last 12 months    Recent Outpatient Visits           2 months ago Type 2 diabetes mellitus with hyperglycemia, with long-term current use of insulin (HCC)   [place] [facility] [place], [place] T, NP   5 months ago Type 2 diabetes mellitus with hyperglycemia, with long-term current use of insulin (HCC)   [place] [facility] [place], [place] T, NP   8 months ago Type 2 diabetes mellitus with hyperglycemia, with long-term current use of insulin (HCC)   [place] [facility] [place], [place] T, NP   10 months ago LUQ abdominal pain   [place] [facility] [place], [place] T, NP   10 months ago LUQ abdominal pain   [place] [facility] [place], Corrie Dandy T, NP       Future Appointments             In 2 weeks Deirdre Evener, MD [facility]   In 4 weeks [place], Dorie Rank, NP  [facility], [facility]

## 2023-08-28 DIAGNOSIS — M0609 Rheumatoid arthritis without rheumatoid factor, multiple sites: Secondary | ICD-10-CM | POA: Diagnosis not present

## 2023-08-29 ENCOUNTER — Encounter: Payer: Self-pay | Admitting: Dermatology

## 2023-08-29 ENCOUNTER — Ambulatory Visit: Payer: Medicare Other | Admitting: Dermatology

## 2023-08-29 DIAGNOSIS — D692 Other nonthrombocytopenic purpura: Secondary | ICD-10-CM

## 2023-08-29 DIAGNOSIS — D1801 Hemangioma of skin and subcutaneous tissue: Secondary | ICD-10-CM

## 2023-08-29 DIAGNOSIS — L821 Other seborrheic keratosis: Secondary | ICD-10-CM | POA: Diagnosis not present

## 2023-08-29 DIAGNOSIS — L814 Other melanin hyperpigmentation: Secondary | ICD-10-CM

## 2023-08-29 DIAGNOSIS — L281 Prurigo nodularis: Secondary | ICD-10-CM

## 2023-08-29 DIAGNOSIS — W908XXA Exposure to other nonionizing radiation, initial encounter: Secondary | ICD-10-CM

## 2023-08-29 DIAGNOSIS — D229 Melanocytic nevi, unspecified: Secondary | ICD-10-CM

## 2023-08-29 DIAGNOSIS — L2081 Atopic neurodermatitis: Secondary | ICD-10-CM

## 2023-08-29 DIAGNOSIS — Z79899 Other long term (current) drug therapy: Secondary | ICD-10-CM

## 2023-08-29 DIAGNOSIS — Z1283 Encounter for screening for malignant neoplasm of skin: Secondary | ICD-10-CM | POA: Diagnosis not present

## 2023-08-29 DIAGNOSIS — L209 Atopic dermatitis, unspecified: Secondary | ICD-10-CM | POA: Diagnosis not present

## 2023-08-29 DIAGNOSIS — Z7189 Other specified counseling: Secondary | ICD-10-CM

## 2023-08-29 DIAGNOSIS — L578 Other skin changes due to chronic exposure to nonionizing radiation: Secondary | ICD-10-CM

## 2023-08-29 NOTE — Progress Notes (Signed)
Follow-Up Visit   Subjective  Laura Rojas is a 77 y.o. female who presents for the following: Skin Cancer Screening and Full Body Skin Exam. No personal Hx or family Hx of skin cancer.   C/O breaking out and itching. Scattered body. Has used Clobetasol cream in the past and Mometasone cream. States these do help but continues to break out.   The patient presents for Total-Body Skin Exam (TBSE) for skin cancer screening and mole check. The patient has spots, moles and lesions to be evaluated, some may be new or changing and the patient may have concern these could be cancer.    The following portions of the chart were reviewed this encounter and updated as appropriate: medications, allergies, medical history  Review of Systems:  No other skin or systemic complaints except as noted in HPI or Assessment and Plan.  Objective  Well appearing patient in no apparent distress; mood and affect are within normal limits.  A full examination was performed including scalp, head, eyes, ears, nose, lips, neck, chest, axillae, abdomen, back, buttocks, bilateral upper extremities, bilateral lower extremities, hands, feet, fingers, toes, fingernails, and toenails. All findings within normal limits unless otherwise noted below.   Relevant physical exam findings are noted in the Assessment and Plan.    Assessment & Plan   SKIN CANCER SCREENING PERFORMED TODAY.  ACTINIC DAMAGE - Chronic condition, secondary to cumulative UV/sun exposure - diffuse scaly erythematous macules with underlying dyspigmentation - Recommend daily broad spectrum sunscreen SPF 30+ to sun-exposed areas, reapply every 2 hours as needed.  - Staying in the shade or wearing long sleeves, sun glasses (UVA+UVB protection) and wide brim hats (4-inch brim around the entire circumference of the hat) are also recommended for sun protection.  - Call for new or changing lesions.  LENTIGINES, SEBORRHEIC KERATOSES, HEMANGIOMAS - Benign  normal skin lesions - Benign-appearing - Call for any changes  MELANOCYTIC NEVI - Tan-brown and/or pink-flesh-colored symmetric macules and papules - Benign appearing on exam today - Observation - Call clinic for new or changing moles - Recommend daily use of broad spectrum spf 30+ sunscreen to sun-exposed areas.   Purpura - Chronic; persistent and recurrent.  Treatable, but not curable. - Violaceous macules and patches on arms - Benign - Related to trauma, age, sun damage and/or use of blood thinners, chronic use of topical and/or oral steroids - Observe - Can use OTC arnica containing moisturizer such as Dermend Bruise Formula if desired - Call for worsening or other concerns  ATOPIC DERMATITIS with prurigo nodularis Exam: Scaly pink patches on trunk and extremities. . Excoriations on legs 20% BSA Chronic and persistent condition with duration or expected duration over one year. Condition is bothersome/symptomatic for patient. Currently flared. Atopic dermatitis (eczema) is a chronic, relapsing, pruritic condition that can significantly affect quality of life. It is often associated with allergic rhinitis and/or asthma and can require treatment with topical medications, phototherapy, or in severe cases biologic injectable medication (Dupixent; Adbry) or Oral JAK inhibitors.  Treatment Plan: Will prescribe [facility] compounded prescription anti-itch cream with  Amitriptyline 5% / Gabapentin 10% / Lidocaine 5% Cream. Apply twice daily to affected areas as needed for itching.  The patient was advised this is not covered by insurance since it is made by a compounding pharmacy. They will receive an email to check out and the medication will be mailed to their home.    If not improving at next visit consider starting Nemluvio.  Recommend gentle  skin care.   Return in about 1 year (around 08/28/2024) for TBSE, Atopic Dermatitis Follow Up in 3-4 months.  I, Lawson Radar, CMA, am  acting as scribe for Armida Sans, MD.   Documentation: I have reviewed the above documentation for accuracy and completeness, and I agree with the above.  Armida Sans, MD

## 2023-08-29 NOTE — Patient Instructions (Addendum)
Will prescribe [facility] compounded prescription anti-itch cream with  Amitriptyline 5% / Gabapentin 10% / Lidocaine 5% Cream. Apply twice daily to affected areas as needed for itching.  The patient was advised this is not covered by insurance since it is made by a compounding pharmacy. They will receive an email to check out and the medication will be mailed to their home.     Instructions for [facility] Medications  One or more of your medications was sent to the [facility] mail order compounding pharmacy. You will receive an email from them and can purchase the medicine through that link. It will then be mailed to your home at the address you confirmed. If for any reason you do not receive an email from them, please check your spam folder. If you still do not find the email, please let us know. [facility] phone number is [phone].    Gentle Skin Care Guide  1. Bathe no more than once a day.  2. Avoid bathing in hot water  3. Use a mild soap like Dove, Vanicream, Cetaphil, CeraVe. Can use Lever 2000 or Cetaphil antibacterial soap  4. Use soap only where you need it. On most days, use it under your arms, between your legs, and on your feet. Let the water rinse other areas unless visibly dirty.  5. When you get out of the bath/shower, use a towel to gently blot your skin dry, don't rub it.  6. While your skin is still a little damp, apply a moisturizing cream such as Vanicream, CeraVe, Cetaphil, Eucerin, Sarna lotion or plain Vaseline Jelly. For hands apply Neutrogena [place] Hand Cream or Excipial Hand Cream.  7. Reapply moisturizer any time you start to itch or feel dry.  8. Sometimes using free and clear laundry detergents can be helpful. Fabric softener sheets should be avoided. Downy Free & Gentle liquid, or any liquid fabric softener that is free of dyes and perfumes, it acceptable to use  9. If your doctor has given you prescription creams you may apply  moisturizers over them   Recommend daily broad spectrum sunscreen SPF 30+ to sun-exposed areas, reapply every 2 hours as needed. Call for new or changing lesions.  Staying in the shade or wearing long sleeves, sun glasses (UVA+UVB protection) and wide brim hats (4-inch brim around the entire circumference of the hat) are also recommended for sun protection.    Melanoma ABCDEs  Melanoma is the most dangerous type of skin cancer, and is the leading cause of death from skin disease.  You are more likely to develop melanoma if you: Have light-colored skin, light-colored eyes, or red or blond hair Spend a lot of time in the sun Tan regularly, either outdoors or in a tanning bed Have had blistering sunburns, especially during childhood Have a close family member who has had a melanoma Have atypical moles or large birthmarks  Early detection of melanoma is key since treatment is typically straightforward and cure rates are extremely high if we catch it early.   The first sign of melanoma is often a change in a mole or a new dark spot.  The ABCDE system is a way of remembering the signs of melanoma.  A for asymmetry:  The two halves do not match. B for border:  The edges of the growth are irregular. C for color:  A mixture of colors are present instead of an even brown color. D for diameter:  Melanomas are usually (but not always)  greater than 6mm - the size of a pencil eraser. E for evolution:  The spot keeps changing in size, shape, and color.  Please check your skin once per month between visits. You can use a small mirror in front and a large mirror behind you to keep an eye on the back side or your body.   If you see any new or changing lesions before your next follow-up, please call to schedule a visit.  Please continue daily skin protection including broad spectrum sunscreen SPF 30+ to sun-exposed areas, reapplying every 2 hours as needed when you're outdoors.   Staying in the shade or  wearing long sleeves, sun glasses (UVA+UVB protection) and wide brim hats (4-inch brim around the entire circumference of the hat) are also recommended for sun protection.      Due to recent changes in healthcare laws, you may see results of your pathology and/or laboratory studies on MyChart before the doctors have had a chance to review them. We understand that in some cases there may be results that are confusing or concerning to you. Please understand that not all results are received at the same time and often the doctors may need to interpret multiple results in order to provide you with the best plan of care or course of treatment. Therefore, we ask that you please give [place] 2 business days to thoroughly review all your results before contacting the office for clarification. Should we see a critical lab result, you will be contacted sooner.   If You Need Anything After Your Visit  If you have any questions or concerns for your doctor, please call our main line at [phone] and press option 4 to reach your doctor's medical assistant. If no one answers, please leave a voicemail as directed and we will return your call as soon as possible. Messages left after 4 pm will be answered the following business day.   You may also send [place] a message via MyChart. We typically respond to MyChart messages within 1-2 business days.  For prescription refills, please ask your pharmacy to contact our office. Our fax number is [phone].  If you have an urgent issue when the clinic is closed that cannot wait until the next business day, you can page your doctor at the number below.    Please note that while we do our best to be available for urgent issues outside of office hours, we are not available 24/7.   If you have an urgent issue and are unable to reach [place], you may choose to seek medical care at your doctor's office, retail clinic, urgent care center, or emergency room.  If you have a medical  emergency, please immediately call 911 or go to the emergency department.  Pager Numbers  - Dr. Gwen Pounds: [phone]  - Dr. Roseanne Reno: [phone]  - Dr. Katrinka Blazing: [phone]   In the event of inclement weather, please call our main line at [phone] for an update on the status of any delays or closures.  Dermatology Medication Tips: Please keep the boxes that topical medications come in in order to help keep track of the instructions about where and how to use these. Pharmacies typically print the medication instructions only on the boxes and not directly on the medication tubes.   If your medication is too expensive, please contact our office at [phone] option 4 or send [place] a message through MyChart.   We are unable to tell what your co-pay for medications will be in  advance as this is different depending on your insurance coverage. However, we may be able to find a substitute medication at lower cost or fill out paperwork to get insurance to cover a needed medication.   If a prior authorization is required to get your medication covered by your insurance company, please allow [place] 1-2 business days to complete this process.  Drug prices often vary depending on where the prescription is filled and some pharmacies may offer cheaper prices.  The website www.goodrx.com contains coupons for medications through different pharmacies. The prices here do not account for what the cost may be with help from insurance (it may be cheaper with your insurance), but the website can give you the price if you did not use any insurance.  - You can print the associated coupon and take it with your prescription to the pharmacy.  - You may also stop by our office during regular business hours and pick up a GoodRx coupon card.  - If you need your prescription sent electronically to a different pharmacy, notify our office through [facility] or by phone at [phone] option 4.     Si Usted  Necesita Algo Despus de Su Visita  Tambin puede enviarnos un mensaje a travs de Clinical cytogeneticist. Por lo general respondemos a los mensajes de MyChart en el transcurso de 1 a 2 das hbiles.  Para renovar recetas, por favor pida a su farmacia que se ponga en contacto con nuestra oficina. Annie Sable de fax es [place] [phone].  Si tiene un asunto urgente cuando la clnica est cerrada y que no puede esperar hasta el siguiente da hbil, puede llamar/localizar a su doctor(a) al nmero que aparece a continuacin.   Por favor, tenga en cuenta que aunque hacemos todo lo posible para estar disponibles para asuntos urgentes fuera del horario de [place], no estamos disponibles las 24 horas del da, los 7 [address]  [address] de la [place].   Si tiene un problema urgente y no puede comunicarse con nosotros, puede optar por buscar atencin mdica  en el consultorio de su doctor(a), en una clnica privada, en un centro de atencin urgente o en una sala de emergencias.  Si tiene Engineer, drilling, por favor llame inmediatamente al 911 o vaya a la sala de emergencias.  Nmeros de bper  - Dr. Gwen Pounds: [phone]  - Dra. Roseanne Reno: [phone]  - Dr. Katrinka Blazing: [phone]   En caso de inclemencias del tiempo, por favor llame a Lacy Duverney principal al [phone] para una actualizacin sobre el [place] de cualquier retraso o cierre.  Consejos para la medicacin en dermatologa: Por favor, guarde las cajas en las que vienen los medicamentos de uso tpico para ayudarle a seguir las instrucciones sobre dnde y cmo usarlos. Las farmacias generalmente imprimen las instrucciones del medicamento slo en las cajas y no directamente en los tubos del [place].   Si su medicamento es muy caro, por favor, pngase en contacto con Rolm Gala llamando al [phone] y presione la opcin 4 o envenos un mensaje a travs de Clinical cytogeneticist.   No podemos decirle cul ser su copago por los medicamentos por adelantado ya que esto  es diferente dependiendo de la cobertura de su seguro. Sin embargo, es posible que podamos encontrar un medicamento sustituto a Audiological scientist un formulario para que el seguro cubra el medicamento que se considera necesario.   Si se requiere una autorizacin previa para que su compaa de seguros [place] su medicamento, por favor permtanos de 1 a  2 das hbiles para completar [address]  Los precios de los medicamentos varan con frecuencia dependiendo del Environmental consultant de dnde se surte la receta y alguna farmacias pueden ofrecer precios ms baratos.  El sitio web www.goodrx.com tiene cupones para medicamentos de Health and safety inspector. Los precios aqu no tienen en cuenta lo que podra costar con la ayuda del seguro (puede ser ms barato con su seguro), pero el sitio web puede darle el precio si no utiliz Tourist information centre manager.  - Puede imprimir el cupn correspondiente y llevarlo con su receta a la farmacia.  - Tambin puede pasar por nuestra oficina durante el horario de atencin regular y Education officer, museum una tarjeta de cupones de GoodRx.  - Si necesita que su receta se enve electrnicamente a una farmacia diferente, informe a nuestra oficina a travs de MyChart de [place] o por telfono llamando al [phone] y presione la opcin 4.

## 2023-09-07 ENCOUNTER — Other Ambulatory Visit: Payer: Self-pay | Admitting: Nurse Practitioner

## 2023-09-09 NOTE — Patient Instructions (Signed)
 Be Involved in Caring For Your Health:  Taking Medications When medications are taken as directed, they can greatly improve your health. But if they are not taken as prescribed, they may not work. In some cases, not taking them correctly can be harmful. To help ensure your treatment remains effective and safe, understand your medications and how to take them. Bring your medications to each visit for review by your provider.  Your lab results, notes, and after visit summary will be available on My Chart. We strongly encourage you to use this feature. If lab results are abnormal the clinic will contact you with the appropriate steps. If the clinic does not contact you assume the results are satisfactory. You can always view your results on My Chart. If you have questions regarding your health or results, please contact the clinic during office hours. You can also ask questions on My Chart.  We at [facility] are grateful that you chose [place] to provide your care. We strive to provide evidence-based and compassionate care and are always looking for feedback. If you get a survey from the clinic please complete this so we can hear your opinions.  Diabetes Mellitus and Skin Care Diabetes, also called diabetes mellitus, can lead to skin problems. If blood sugar (glucose) is not well controlled, it can cause problems over time. These problems include: Damage to nerves. This can affect your ability to feel wounds. This means you may not notice small skin injuries that could lead to bigger problems. This can also decrease the amount that you sweat, causing dry skin. Damage to blood vessels. The lack of blood flow can cause skin to break down. It can also slow healing time, which can lead to infections. Areas of skin that become thick or discolored. Common skin conditions There are certain skin conditions that often affect people with diabetes. These include: Dry skin. Thin skin. The skin on the feet  may get thinner, break more easily, and heal more slowly than normal. Skin infections from bacteria. These include: Styes. These are infections near the eyelid. Boils. These are bumps filled with pus. Infected hair follicles. Infections of the skin around the nails. Fungal skin infections. These are most common in areas where skin rubs together, such as in the armpits or under the breasts. Common skin changes Diabetes can also cause the skin to change. You may develop: Dark, velvety markings on your skin. These may appear on your face, neck, armpits, inner thighs, and groin. Red, raised, scar-like tissue that may itch, feel painful, or become a wound. Blisters on your feet, toes, hands, or fingers. Thick, wax-like areas of skin. In most cases, these occur on the hands, forehead, or toes. Brown or red, ring-shaped or half-ring-shaped patches of skin on the ears or fingers. Pea-shaped, yellow bumps that may be itchy and have a red ring around them. This may affect your arms, feet, buttocks, and the top of your hands. Round, discolored patches of tan skin that do not hurt or itch. These may look like age spots. Supplies needed: Mild soap or gentle skin cleanser. Lotion. How to care for dry, itchy skin Frequent high glucose levels can cause skin to become itchy. Poor blood circulation and skin infections can make dry skin worse. If you have dry, itchy skin: Avoid very hot showers and baths. Use mild soap and gentle skin cleansers. Do not use soap that is perfumed, harsh, or that dries your skin. Moisturizing soaps may help. Put on moisturizing  lotion as soon as you finish bathing. Do not scratch dry skin. Scratching can expose skin to infection. If you have a rash or if your skin is very itchy, contact your health care provider. Skin that is red or covered in a rash may be a sign of an allergic reaction. Very itchy skin may mean that you need help to manage your diabetes better. You may also  need treatment for an infection. General tips Most skin problems can be prevented or treated easily if caught early. Talk with your health care provider if you have any concerns. General tips include: Check your skin every day for cuts, bruises, redness, blisters, or sores, especially on your feet. If you cannot see the bottom of your feet, use a mirror or ask someone for help. Tell your health care provider if you have any of these injuries and if they are healing slowly. Keep your skin clean and dry. Do not use hot water. Moisturize your skin to prevent chapping. Keep your blood glucose levels within target range. Follow these instructions at home:  Take over-the-counter and prescription medicines only as told by your health care provider. This includes all diabetes medicines you are taking. Schedule a foot exam with your health care provider once a year. During the exam, the structure and skin of your feet will be checked for problems. Make sure that your health care provider does a visual foot exam at every visit. If you get a skin injury, such as a cut, blister, or sore, check the area every day for signs of infection. Check for: Redness, swelling, or pain. Fluid or blood. Warmth. Pus or a bad smell. Do not use any products that contain nicotine or tobacco. These products include cigarettes, chewing tobacco, and vaping devices, such as e-cigarettes. If you need help quitting, ask your health care provider. Where to find more information American Diabetes Association: diabetes.org Association of Diabetes Care & Education Specialists: diabeteseducator.org Contact a health care provider if: You get a cut or sore, especially on your feet. You have signs of infection after a skin injury. You have itchy skin that turns red or develops a rash. You have discolored areas of skin. You have places on your skin that change. They may thicken or appear shiny. This information is not intended to  replace advice given to you by your health care provider. Make sure you discuss any questions you have with your health care provider. Document Revised: 05/17/2022 Document Reviewed: 05/17/2022 Elsevier Patient Education  2024 [employer].

## 2023-09-10 NOTE — Telephone Encounter (Signed)
Requested medications are due for refill today.  yes  Requested medications are on the active medications list.  yes  Last refill. 06/08/2023 #180 0 rf  Future visit scheduled.   yes  Notes to clinic.  Refill not delegated.    Requested Prescriptions  Pending Prescriptions Disp Refills   LORazepam (ATIVAN) 0.5 MG tablet [Pharmacy Med Name: LORAZEPAM 0.5 MG TABLET] 180 tablet 0    Sig: TAKE 1 TAB BY MOUTH TWICE A DAY (USE THE ADDITIONAL 30 TABS ONLY SPARINGLY & ONLY FOR SEVERE ANXIETY)     Not Delegated - Psychiatry: Anxiolytics/Hypnotics 2 Failed - 09/07/2023 10:20 PM      Failed - This refill cannot be delegated      Passed - Urine Drug Screen completed in last 360 days      Passed - Patient is not pregnant      Passed - Valid encounter within last 6 months    Recent Outpatient Visits           3 months ago Type 2 diabetes mellitus with hyperglycemia, with long-term current use of insulin (HCC)   [place] [facility] [place], [place] T, NP   6 months ago Type 2 diabetes mellitus with hyperglycemia, with long-term current use of insulin (HCC)   [place] [facility] [place], [place]ano T, NP   9 months ago Type 2 diabetes mellitus with hyperglycemia, with long-term current use of insulin (HCC)   [place] [facility] [place], [place] T, NP   10 months ago LUQ abdominal pain   [place] [facility] [place], [place] T, NP   11 months ago LUQ abdominal pain   [place] [facility] [place], Shoshoni T, NP       Future Appointments             Tomorrow Marjie Skiff, NP [place] [facility]   In 4 months Deirdre Evener, MD [place] [facility]   In 4 months Cro[place], Rachelle Hora, MD [place] [facility]   In 11 months Deirdre Evener, MD [place] [facility]            Signed Prescriptions Disp Refills   pantoprazole (PROTONIX) 40 MG  tablet 90 tablet 1    Sig: TAKE 1 TABLET BY MOUTH EVERY DAY     Gastroenterology: Proton Pump Inhibitors Passed - 09/07/2023 10:20 PM      Passed - Valid encounter within last 12 months    Recent Outpatient Visits           3 months ago Type 2 diabetes mellitus with hyperglycemia, with long-term current use of insulin (HCC)   [place] [facility] [place], [place] T, NP   6 months ago Type 2 diabetes mellitus with hyperglycemia, with long-term current use of insulin (HCC)   [place] [facility] [place], [place] T, NP   9 months ago Type 2 diabetes mellitus with hyperglycemia, with long-term current use of insulin (HCC)   [place] [facility] [place], Corrie Dandy T, NP   10 months ago LUQ abdominal pain   [place] [facility] [place], Carl[place] T, NP   11 months ago LUQ abdominal pain   [place] [facility] [place], Dorie Rank, NP       Future Appointments             Tomorrow Marjie Skiff, NP   [facility], [facility]   In 4 months Gwen Pounds, Dineen Kid, MD [facility] [place]   In 4 months Croitoru, Rachelle Hora, MD [facility]   In 11 months Deirdre Evener, MD [facility]

## 2023-09-10 NOTE — Telephone Encounter (Signed)
Requested Prescriptions  Pending Prescriptions Disp Refills   LORazepam (ATIVAN) 0.5 MG tablet [Pharmacy Med Name: LORAZEPAM 0.5 MG TABLET] 180 tablet 0    Sig: TAKE 1 TAB BY MOUTH TWICE A DAY (USE THE ADDITIONAL 30 TABS ONLY SPARINGLY & ONLY FOR SEVERE ANXIETY)     Not Delegated - Psychiatry: Anxiolytics/Hypnotics 2 Failed - 09/07/2023 10:20 PM      Failed - This refill cannot be delegated      Passed - Urine Drug Screen completed in last 360 days      Passed - Patient is not pregnant      Passed - Valid encounter within last 6 months    Recent Outpatient Visits           3 months ago Type 2 diabetes mellitus with hyperglycemia, with long-term current use of insulin (HCC)   [place] [facility] [place], [place] T, NP   6 months ago Type 2 diabetes mellitus with hyperglycemia, with long-term current use of insulin (HCC)   [place] [facility] [place], [place] T, NP   9 months ago Type 2 diabetes mellitus with hyperglycemia, with long-term current use of insulin (HCC)   [place] [facility] [place] [place], Corrie Dandy T, NP   10 months ago LUQ abdominal pain   [place] [facility] [place], Clayton T, NP   11 months ago LUQ abdominal pain   [place] [facility] [place], [place] T, NP       Future Appointments             Tomorrow Marjie Skiff, NP [place] [facility]   In 4 months Deirdre Evener, MD [place] [facility]   In 4 months Croitoru, Rachelle Hora, MD [place] [facility]   In 11 months Deirdre Evener, MD [place]  [facility]             pantoprazole (PROTONIX) 40 MG tablet [Pharmacy Med Name: PANTOPRAZOLE SOD DR 40 MG TAB] 90 tablet 1    Sig: TAKE 1 TABLET BY MOUTH EVERY DAY     Gastroenterology: Proton Pump Inhibitors Passed - 09/07/2023 10:20 PM      Passed - Valid encounter within last 12 months    Recent Outpatient Visits            3 months ago Type 2 diabetes mellitus with hyperglycemia, with long-term current use of insulin (HCC)   [place] [facility] [place], [place] T, NP   6 months ago Type 2 diabetes mellitus with hyperglycemia, with long-term current use of insulin (HCC)   [place] [facility] [place], [place] T, NP   9 months ago Type 2 diabetes mellitus with hyperglycemia, with long-term current use of insulin (HCC)   [place] [facility] [place], Corrie Dandy T, NP   10 months ago LUQ abdominal pain   [place] [facility] [place], [place] T, NP   11 months ago LUQ abdominal pain    [facility] [place], [place] T, NP       Future Appointments             Tomorrow Marjie Skiff, NP  [facility]   In 4 months Deirdre Evener, MD [facility] [place]   In 4 months Croitoru, Rachelle Hora, MD [facility]   In 11 months Deirdre Evener, MD  [facility] [place]

## 2023-09-11 ENCOUNTER — Ambulatory Visit (INDEPENDENT_AMBULATORY_CARE_PROVIDER_SITE_OTHER): Payer: Medicare Other | Admitting: Nurse Practitioner

## 2023-09-11 ENCOUNTER — Encounter: Payer: Self-pay | Admitting: Nurse Practitioner

## 2023-09-11 VITALS — BP 139/76 | HR 60 | Temp 97.6°F | Ht 65.0 in | Wt 153.0 lb

## 2023-09-11 DIAGNOSIS — Z23 Encounter for immunization: Secondary | ICD-10-CM | POA: Diagnosis not present

## 2023-09-11 DIAGNOSIS — E1165 Type 2 diabetes mellitus with hyperglycemia: Secondary | ICD-10-CM | POA: Diagnosis not present

## 2023-09-11 DIAGNOSIS — E785 Hyperlipidemia, unspecified: Secondary | ICD-10-CM

## 2023-09-11 DIAGNOSIS — F413 Other mixed anxiety disorders: Secondary | ICD-10-CM

## 2023-09-11 DIAGNOSIS — I152 Hypertension secondary to endocrine disorders: Secondary | ICD-10-CM | POA: Diagnosis not present

## 2023-09-11 DIAGNOSIS — M0609 Rheumatoid arthritis without rheumatoid factor, multiple sites: Secondary | ICD-10-CM | POA: Diagnosis not present

## 2023-09-11 DIAGNOSIS — E1159 Type 2 diabetes mellitus with other circulatory complications: Secondary | ICD-10-CM | POA: Diagnosis not present

## 2023-09-11 DIAGNOSIS — N183 Chronic kidney disease, stage 3 unspecified: Secondary | ICD-10-CM

## 2023-09-11 DIAGNOSIS — E1169 Type 2 diabetes mellitus with other specified complication: Secondary | ICD-10-CM

## 2023-09-11 DIAGNOSIS — Z79899 Other long term (current) drug therapy: Secondary | ICD-10-CM

## 2023-09-11 DIAGNOSIS — K7460 Unspecified cirrhosis of liver: Secondary | ICD-10-CM

## 2023-09-11 DIAGNOSIS — F339 Major depressive disorder, recurrent, unspecified: Secondary | ICD-10-CM

## 2023-09-11 DIAGNOSIS — Z0000 Encounter for general adult medical examination without abnormal findings: Secondary | ICD-10-CM

## 2023-09-11 DIAGNOSIS — Z794 Long term (current) use of insulin: Secondary | ICD-10-CM | POA: Diagnosis not present

## 2023-09-11 DIAGNOSIS — I25708 Atherosclerosis of coronary artery bypass graft(s), unspecified, with other forms of angina pectoris: Secondary | ICD-10-CM | POA: Diagnosis not present

## 2023-09-11 DIAGNOSIS — R748 Abnormal levels of other serum enzymes: Secondary | ICD-10-CM | POA: Diagnosis not present

## 2023-09-11 DIAGNOSIS — E1122 Type 2 diabetes mellitus with diabetic chronic kidney disease: Secondary | ICD-10-CM

## 2023-09-11 MED ORDER — NOVOLOG FLEXPEN 100 UNIT/ML ~~LOC~~ SOPN: PEN_INJECTOR | SUBCUTANEOUS | 8 refills | Status: DC

## 2023-09-11 NOTE — Assessment & Plan Note (Signed)
Chronic, ongoing.  Denies SI/HI.  Continue Sertraline 150 MG daily as is ordered. This is offering benefit to mood overall and may help reduce benzo use.  Consider increase to 200 MG in future for increased benefit.  Return in 3 months.

## 2023-09-11 NOTE — Assessment & Plan Note (Signed)
Ongoing, will continue collaboration with GI at this time.  Remain off Januvia and GLP1.  Recent GI notes reviewed,

## 2023-09-11 NOTE — Assessment & Plan Note (Addendum)
Chronic, stable.  No recent chest pain.  Continue collaboration with cardiology and preventative treatment, including statin and ASA.

## 2023-09-11 NOTE — Assessment & Plan Note (Signed)
Chronic, ongoing for insomnia & anxiety.  Has had trials of other medications and reductions with poor outcomes.  Fully aware of risks of chronic benzo use and aware this will be discussed each visit.  UDS obtained today (due next 06/07/24) and contract on file.

## 2023-09-11 NOTE — Assessment & Plan Note (Addendum)
Continue collaboration with GI -- recheck levels today and remain off GLP1 and Januvia.

## 2023-09-11 NOTE — Assessment & Plan Note (Signed)
Chronic, ongoing, followed by endocrinology in past.  A1c 7.5% last visit, trend down (lowest level in 2 years) and at goal for age per geriatric society, remaining well below her previous levels in 9-10 range.  Will recheck today, may be trend back up based on patient report.  She wishes to avoid return to endo at this time as her previous provider left.  Will continue Basaglar and adjust as needed based on A1c result + continue sliding scale and Metformin.  Continue collaboration with CCM team in office.   Bring Libre to next visit.  Return to office in 3 months -- goal to avoid hypoglycemia.  If ongoing poor control will get back into endo. - Eye and foot exam up to date - Statin on board.  ARB on board. - Vaccinations up to date.

## 2023-09-11 NOTE — Assessment & Plan Note (Signed)
Chronic, stable Stage 3a.  Continue Losartan for kidney protection.  Urine ALB 26 December 2022. Continue collaboration with cardiology.  Consider nephrology referral if worsening function.  Labs: CMP.

## 2023-09-11 NOTE — Assessment & Plan Note (Signed)
Chronic, ongoing.  Denies SI/HI.  Continue Sertraline and Ativan.  Has had multiple attempts at reduction of benzo with no success.  Discussed at length risks with her and she is aware.  Continue Ativan and fill upon request.  PDMP reviewed.  Return in 3 months, UDS due next 06/07/24 and contract on file.

## 2023-09-11 NOTE — Assessment & Plan Note (Addendum)
Chronic, stable.  Followed by rheumatology.  Continue this collaboration.  Recent notes reviewed.  Has injections with them.

## 2023-09-11 NOTE — Assessment & Plan Note (Signed)
Chronic, ongoing.  Continue current medication regimen and adjust as needed.  Lipid panel today.  Discussed with patient, if myalgias with Atorvastatin, + her history of myalgia with statins, could consider Repatha initiation if poor tolerance.

## 2023-09-11 NOTE — Progress Notes (Signed)
BP 139/76   Pulse 60   Temp 97.6 F (36.4 C) (Oral)   Ht 5\' 5"  (1.651 m)   Wt 153 lb (69.4 kg)   SpO2 97%   BMI 25.46 kg/m    Subjective:    Patient ID: Laura Rojas, female    DOB: 12/02/1945, 77 y.o.   MRN: [id number]  HPI: Laura Rojas is a 77 y.o. female  Chief Complaint  Patient presents with   Depression   Diabetes   Hyperlipidemia   Hypertension   Chronic Kidney Disease   RLS   Arthritis   DIABETES A1c 7.5% July which is stable for her. She continues on Basaglar, Novolog sliding scale, and Metformin.  Followed by endocrinology in past, last saw NP [facility] 05/16/21, has not returned as NP no longer at the practice and she prefers to stay with PCP.  Stopped Januvia in past due to pancreatitis and elevation in Lipase and Amylase.  Had a visit with GI on 02/06/23 (Dr. Norma Fredrickson), they offered EGD and colonoscopy but she wanted to hold off on this.  Returns in November to check on cirrhosis.  She did not tolerate GLP1 (Trulicity) in past and had side effects with [place].   Hypoglycemic episodes: had one level that was 50 Polydipsia/polyuria: no Visual disturbance: no Chest pain: no Paresthesias: no Glucose Monitoring: yes             Accucheck frequency: Freestyle -- 50 to 300 -- did not bring in today, last check her average over 90 days was 170             Fasting glucose:              Post prandial:             Evening:              Before meals: Taking Insulin?: yes             Long acting insulin: 6 to 10 units often at night, a reduction from previous check             Short acting insulin: sliding scale units with meals (20 units) Blood Pressure Monitoring: rarely Retinal Examination: Up to Date -- [facility] Foot Exam: Up to Date Pneumovax: Up to Date Influenza: Up to Date Aspirin: yes   HYPERTENSION / HYPERLIPIDEMIA/CAD Continues on Amlodipine, Plavix, HCTZ, Fish oil, Losartan, Lipitor. Dr. Royann Shivers with cardiology follows, last saw 12/04/22, is  to return annually. Follows due to subclavian artery stenosis and angina. Satisfied with current treatment? yes Duration of hypertension: chronic BP monitoring frequency: not checking BP range:  BP medication side effects: no Duration of hyperlipidemia: chronic Cholesterol medication side effects: no Cholesterol supplements: fish oil Medication compliance: good compliance Aspirin: yes Recent stressors: no Recurrent headaches: no Visual changes: no Palpitations: no Dyspnea: no Chest pain: no Lower extremity edema: no Dizzy/lightheaded: no   CHRONIC KIDNEY DISEASE Stable on recent labs, Stage 3a. CKD status: stable Medications renally dose: yes Previous renal evaluation: no Pneumovax:  Up to Date Influenza Vaccine:  Up to Date    POLYMYALGIA RHEUMATICA & RLS Diagnosis of polymyalgia rheumatica syndrome. Had visit with rheumatology last 03/01/23, getting injections at this time, last dose of Cimzia was 08/28/23.  These have kept pain under control.  RLS is stable, no Gabapentin at this time. Took in past. Duration: chronic Involved joints: right hand, both feet, and left knee Mechanism of injury: arthritis Severity: 0/10 today  Quality:  dull, aching and burning Frequency: intermittent  Radiation: no Aggravating factors: walking and movement  Alleviating factors: Aspercreme and APAP  Status: stable Treatments attempted: Tylenol and Aspercreme Relief with NSAIDs?:  No NSAIDs Taken Weakness with weight bearing or walking: no Sensation of giving way: no  ANXIETY/STRESS Continues Zoloft 150 MG daily.  Continues Ativan 0.5 MG, takes this at night only, although ordered BID -- sometimes takes two every night (1 MG) - - started by previous PCP. Has not needed extra in a while.  Pt aware of risks of benzo medication use to include increased sedation, respiratory suppression, falls, dependence and cardiovascular events.  Pt would like to continue treatment as benefit determined to  outweigh risk. On review PDMP last Ativan fill 06/07/23 for 3 months supply, previous PCP ordered this way. Duration:stable Anxious mood: no Excessive worrying: no Irritability: no  Sweating: no Nausea: no Palpitations:no Hyperventilation: no Panic attacks: no Agoraphobia: no  Obscessions/compulsions: no Depressed mood: no    09/11/2023   10:49 AM 06/08/2023   11:15 AM 03/09/2023   11:00 AM 12/08/2022   11:07 AM 10/16/2022    3:30 PM  Depression screen PHQ 2/9  Decreased Interest 0 0 0 0 0  Down, Depressed, Hopeless 0 0 0 0 0  PHQ - 2 Score 0 0 0 0 0  Altered sleeping 1 2 1 1 1   Tired, decreased energy 1 1 1  0 1  Change in appetite 1 1 1  0 0  Feeling bad or failure about yourself  0 0 0 0 0  Trouble concentrating 0 0 0 0 0  Moving slowly or fidgety/restless 0 0 0 0 0  Suicidal thoughts 0 0 0 0 0  PHQ-9 Score 3 4 3 1 2   Difficult doing work/chores Not difficult at all Not difficult at all Not difficult at all Not difficult at all Not difficult at all      09/11/2023   10:49 AM 06/08/2023   11:15 AM 03/09/2023   11:00 AM 12/08/2022   11:08 AM  GAD 7 : Generalized Anxiety Score  Nervous, Anxious, on Edge 0 0 0 0  Control/stop worrying 0 0 0 0  Worry too much - different things 0 0 0 0  Trouble relaxing 0 0 0 0  Restless 0 0 0 0  Easily annoyed or irritable 1 0 0 0  Afraid - awful might happen 0 0 0 0  Total GAD 7 Score 1 0 0 0  Anxiety Difficulty Not difficult at all Not difficult at all Not difficult at all Not difficult at all      Relevant past medical, surgical, family and social history reviewed and updated as indicated. Interim medical history since our last visit reviewed. Allergies and medications reviewed and updated.  Review of Systems  Constitutional:  Negative for activity change, appetite change, diaphoresis, fatigue and fever.  Respiratory:  Negative for cough, chest tightness and shortness of breath.   Cardiovascular:  Negative for chest pain,  palpitations and leg swelling.  Gastrointestinal: Negative.   Endocrine: Negative for cold intolerance, heat intolerance, polydipsia, polyphagia and polyuria.  Neurological:  Negative for dizziness, syncope, weakness, light-headedness, numbness and headaches.  Psychiatric/Behavioral: Negative.      Per HPI unless specifically indicated above     Objective:    BP 139/76   Pulse 60   Temp 97.6 F (36.4 C) (Oral)   Ht 5\' 5"  (1.651 m)   Wt 153 lb (69.4 kg)   SpO2  97%   BMI 25.46 kg/m   Wt Readings from Last 3 Encounters:  09/11/23 153 lb (69.4 kg)  06/08/23 153 lb 12.8 oz (69.8 kg)  03/09/23 154 lb 6.4 oz (70 kg)    Physical Exam Vitals and nursing note reviewed.  Constitutional:      General: She is awake. She is not in acute distress.    Appearance: She is well-developed, well-groomed and overweight. She is not ill-appearing.  HENT:     Head: Normocephalic.     Right Ear: Hearing normal.     Left Ear: Hearing normal.  Eyes:     General: Lids are normal.        Right eye: No discharge.        Left eye: No discharge.     Conjunctiva/sclera: Conjunctivae normal.     Pupils: Pupils are equal, round, and reactive to light.  Neck:     Thyroid: No thyromegaly.     Vascular: Carotid bruit (bilateral) present.  Cardiovascular:     Rate and Rhythm: Normal rate and regular rhythm.     Heart sounds: Normal heart sounds. No murmur heard.    No gallop.  Pulmonary:     Effort: Pulmonary effort is normal. No accessory muscle usage or respiratory distress.     Breath sounds: No wheezing or rhonchi.  Abdominal:     General: Bowel sounds are normal.     Palpations: Abdomen is soft.  Musculoskeletal:     Cervical back: Normal range of motion and neck supple.     Right lower leg: No edema.     Left lower leg: No edema.  Skin:    General: Skin is warm and dry.  Neurological:     Mental Status: She is alert and oriented to person, place, and time.  Psychiatric:        Attention  and Perception: Attention normal.        Mood and Affect: Mood normal.        Speech: Speech normal.        Behavior: Behavior normal. Behavior is cooperative.        Thought Content: Thought content normal.    Results for orders placed or performed in visit on 06/08/23  Bayer DCA Hb A1c Waived  Result Value Ref Range   HB A1C (BAYER DCA - WAIVED) 7.5 (H) 4.8 - 5.6 %  [id number] 11+Oxyco+Alc+Crt-Bund  Result Value Ref Range   Ethanol Negative Cutoff=0.020 %   Amphetamines, Urine Negative Cutoff=1000 ng/mL   Barbiturate Negative Cutoff=200 ng/mL   BENZODIAZ UR QL Negative Cutoff=200 ng/mL   Cannabinoid Quant, Ur Negative Cutoff=50 ng/mL   Cocaine (Metabolite) Negative Cutoff=300 ng/mL   OPIATE SCREEN URINE Negative Cutoff=300 ng/mL   Oxycodone/Oxymorphone, Urine Negative Cutoff=300 ng/mL   Phencyclidine Negative Cutoff=25 ng/mL   Methadone Screen, Urine Negative Cutoff=300 ng/mL   Propoxyphene Negative Cutoff=300 ng/mL   Meperidine Negative Cutoff=200 ng/mL   Tramadol Negative Cutoff=200 ng/mL   Creatinine 131.6 20.0 - 300.0 mg/dL   pH, Urine 6.5 4.5 - 8.9  Comprehensive metabolic panel  Result Value Ref Range   Glucose 140 (H) 70 - 99 mg/dL   BUN 19 8 - 27 mg/dL   Creatinine, Ser 6.96 0.57 - 1.00 mg/dL   eGFR 58 (L) >29 BM/WUX/3.24   BUN/Creatinine Ratio 19 12 - 28   Sodium 141 134 - 144 mmol/L   Potassium 4.1 3.5 - 5.2 mmol/L   Chloride 102 96 - 106 mmol/L  CO2 28 20 - 29 mmol/L   Calcium 10.2 8.7 - 10.3 mg/dL   Total Protein 6.7 6.0 - 8.5 g/dL   Albumin 4.5 3.8 - 4.8 g/dL   Globulin, Total 2.2 1.5 - 4.5 g/dL   Bilirubin Total 0.4 0.0 - 1.2 mg/dL   Alkaline Phosphatase 138 (H) 44 - 121 IU/L   AST 22 0 - 40 IU/L   ALT 22 0 - 32 IU/L  Lipid Panel w/o Chol/HDL Ratio  Result Value Ref Range   Cholesterol, Total 164 100 - 199 mg/dL   Triglycerides 161 (H) 0 - 149 mg/dL   HDL 63 >09 mg/dL   VLDL Cholesterol Cal 37 5 - 40 mg/dL   LDL Chol Calc (NIH) 64 0 - 99 mg/dL   Amylase  Result Value Ref Range   Amylase 95 31 - 110 U/L  Lipase  Result Value Ref Range   Lipase 111 (H) 14 - 85 U/L      Assessment & Plan:   Problem List Items Addressed This Visit       Cardiovascular and Mediastinum   Coronary artery disease of bypass graft of native heart with stable angina pectoris (HCC)    Chronic, stable.  No recent chest pain.  Continue collaboration with cardiology and preventative treatment, including statin and ASA.      Hypertension associated with diabetes (HCC)    Chronic, stable.  BP at goal for age.  Continue current medication regimen and collaboration with cardiology. Labs: CMP.  Recommend she monitor BP at least three days a week at home and document for providers + bring to visits.  DASH diet focus.  Return in 3 months.      Relevant Medications   insulin aspart (NOVOLOG FLEXPEN) 100 UNIT/ML FlexPen   Other Relevant Orders   HgB A1c     Digestive   Cirrhosis, non-alcoholic (HCC)    Ongoing, will continue collaboration with GI at this time.  Remain off Januvia and GLP1.  Recent GI notes reviewed,         Endocrine   CKD stage 3 due to type 2 diabetes mellitus (HCC)    Chronic, stable Stage 3a.  Continue Losartan for kidney protection.  Urine ALB 26 December 2022. Continue collaboration with cardiology.  Consider nephrology referral if worsening function.  Labs: CMP.      Relevant Medications   insulin aspart (NOVOLOG FLEXPEN) 100 UNIT/ML FlexPen   Other Relevant Orders   HgB A1c   Comprehensive metabolic panel   Hyperlipidemia associated with type 2 diabetes mellitus (HCC)    Chronic, ongoing.  Continue current medication regimen and adjust as needed.  Lipid panel today.  Discussed with patient, if myalgias with Atorvastatin, + her history of myalgia with statins, could consider Repatha initiation if poor tolerance.      Relevant Medications   insulin aspart (NOVOLOG FLEXPEN) 100 UNIT/ML FlexPen   Other Relevant Orders   HgB  A1c   Comprehensive metabolic panel   Lipid Panel w/o Chol/HDL Ratio   Type 2 diabetes mellitus with hyperglycemia, with long-term current use of insulin (HCC) - Primary    Chronic, ongoing, followed by endocrinology in past.  A1c 7.5% last visit, trend down (lowest level in 2 years) and at goal for age per geriatric society, remaining well below her previous levels in 9-10 range.  Will recheck today, may be trend back up based on patient report.  She wishes to avoid return to endo at this time as her  previous provider left.  Will continue Basaglar and adjust as needed based on A1c result + continue sliding scale and Metformin.  Continue collaboration with CCM team in office.   Bring Libre to next visit.  Return to office in 3 months -- goal to avoid hypoglycemia.  If ongoing poor control will get back into endo. - Eye and foot exam up to date - Statin on board.  ARB on board. - Vaccinations up to date.      Relevant Medications   insulin aspart (NOVOLOG FLEXPEN) 100 UNIT/ML FlexPen   Other Relevant Orders   HgB A1c     Musculoskeletal and Integument   Rheumatoid arthritis of multiple sites with negative rheumatoid factor (HCC)    Chronic, stable.  Followed by rheumatology.  Continue this collaboration.  Recent notes reviewed.  Has injections with them.         Other   Anxiety disorder    Chronic, ongoing.  Denies SI/HI.  Continue Sertraline and Ativan.  Has had multiple attempts at reduction of benzo with no success.  Discussed at length risks with her and she is aware.  Continue Ativan and fill upon request.  PDMP reviewed.  Return in 3 months, UDS due next 06/07/24 and contract on file.      Depression, recurrent (HCC)    Chronic, ongoing.  Denies SI/HI.  Continue Sertraline 150 MG daily as is ordered. This is offering benefit to mood overall and may help reduce benzo use.  Consider increase to 200 MG in future for increased benefit.  Return in 3 months.      Elevated amylase and  lipase    Continue collaboration with GI -- recheck levels today and remain off GLP1 and Januvia.      Relevant Orders   Amylase   Lipase   Long-term current use of benzodiazepine    Chronic, ongoing for insomnia & anxiety.  Has had trials of other medications and reductions with poor outcomes.  Fully aware of risks of chronic benzo use and aware this will be discussed each visit.  UDS obtained today (due next 06/07/24) and contract on file.      Other Visit Diagnoses     Flu vaccine need       Flu vaccine today, educated patient.   Relevant Orders   Flu Vaccine Trivalent High Dose (Fluad) (Completed)        Follow up plan: Return in about 3 months (around 12/12/2023) for T2DM, HTN/HLD, MOOD, RA, CKD, GERD, B12.

## 2023-09-11 NOTE — Assessment & Plan Note (Signed)
Chronic, stable.  BP at goal for age.  Continue current medication regimen and collaboration with cardiology. Labs: CMP.  Recommend she monitor BP at least three days a week at home and document for providers + bring to visits.  DASH diet focus.  Return in 3 months.

## 2023-09-12 LAB — COMPREHENSIVE METABOLIC PANEL
ALT: 23 [IU]/L (ref 0–32)
AST: 21 [IU]/L (ref 0–40)
Albumin: 4.5 g/dL (ref 3.8–4.8)
Alkaline Phosphatase: 150 [IU]/L — ABNORMAL HIGH (ref 44–121)
BUN/Creatinine Ratio: 16 (ref 12–28)
BUN: 15 mg/dL (ref 8–27)
Bilirubin Total: 0.4 mg/dL (ref 0.0–1.2)
CO2: 24 mmol/L (ref 20–29)
Calcium: 9.6 mg/dL (ref 8.7–10.3)
Chloride: 104 mmol/L (ref 96–106)
Creatinine, Ser: 0.91 mg/dL (ref 0.57–1.00)
Globulin, Total: 2.2 g/dL (ref 1.5–4.5)
Glucose: 152 mg/dL — ABNORMAL HIGH (ref 70–99)
Potassium: 4 mmol/L (ref 3.5–5.2)
Sodium: 142 mmol/L (ref 134–144)
Total Protein: 6.7 g/dL (ref 6.0–8.5)
eGFR: 65 mL/min/{1.73_m2} (ref >59)

## 2023-09-12 LAB — LIPID PANEL W/O CHOL/HDL RATIO
Cholesterol, Total: 155 mg/dL (ref 100–199)
HDL: 63 mg/dL (ref >39)
LDL Chol Calc (NIH): 71 mg/dL (ref 0–99)
Triglycerides: 122 mg/dL (ref 0–149)
VLDL Cholesterol Cal: 21 mg/dL (ref 5–40)

## 2023-09-12 LAB — HEMOGLOBIN A1C
Est. average glucose Bld gHb Est-mCnc: 160 mg/dL
Hgb A1c MFr Bld: 7.2 % — ABNORMAL HIGH (ref 4.8–5.6)

## 2023-09-12 LAB — LIPASE: Lipase: 98 U/L — ABNORMAL HIGH (ref 14–85)

## 2023-09-12 LAB — AMYLASE: Amylase: 86 U/L (ref 31–110)

## 2023-09-12 NOTE — Progress Notes (Signed)
Called and scheduled patient on 10/09/2023 @ 3:45 pm via telephone.

## 2023-09-12 NOTE — Progress Notes (Signed)
Contacted via MyChart   Good morning Laura Rojas, I am so excited to tell you about your A1c -- 7.2%, AMAZING.  Lowest it has been in years.  Continue current regimen and diet focus.  - Kidney function, creatinine and eGFR, remains normal, as is liver function, AST and ALT.  Alkaline phosphatase and lipase remain elevated, however lipase is trending down. Continue to visit with your GI doctor for liver checks. - Remainder of labs look great, no changes needed.  Any questions? Keep being stellar!!  Thank you for allowing me to participate in your care.  I appreciate you. Kindest regards, Imari Reen

## 2023-09-17 ENCOUNTER — Emergency Department: Payer: Medicare Other

## 2023-09-17 ENCOUNTER — Ambulatory Visit: Payer: Self-pay | Admitting: *Deleted

## 2023-09-17 ENCOUNTER — Other Ambulatory Visit: Payer: Self-pay

## 2023-09-17 ENCOUNTER — Emergency Department
Admission: EM | Admit: 2023-09-17 | Discharge: 2023-09-17 | Disposition: A | Discharge status: Home / Self Care | Payer: Medicare Other | Attending: Emergency Medicine | Admitting: Emergency Medicine

## 2023-09-17 DIAGNOSIS — S8992XA Unspecified injury of left lower leg, initial encounter: Secondary | ICD-10-CM | POA: Diagnosis present

## 2023-09-17 DIAGNOSIS — S0990XA Unspecified injury of head, initial encounter: Secondary | ICD-10-CM | POA: Diagnosis not present

## 2023-09-17 DIAGNOSIS — I129 Hypertensive chronic kidney disease with stage 1 through stage 4 chronic kidney disease, or unspecified chronic kidney disease: Secondary | ICD-10-CM | POA: Insufficient documentation

## 2023-09-17 DIAGNOSIS — I6523 Occlusion and stenosis of bilateral carotid arteries: Secondary | ICD-10-CM | POA: Diagnosis not present

## 2023-09-17 DIAGNOSIS — W19XXXA Unspecified fall, initial encounter: Secondary | ICD-10-CM

## 2023-09-17 DIAGNOSIS — Z794 Long term (current) use of insulin: Secondary | ICD-10-CM | POA: Diagnosis not present

## 2023-09-17 DIAGNOSIS — I2510 Atherosclerotic heart disease of native coronary artery without angina pectoris: Secondary | ICD-10-CM | POA: Insufficient documentation

## 2023-09-17 DIAGNOSIS — E1122 Type 2 diabetes mellitus with diabetic chronic kidney disease: Secondary | ICD-10-CM | POA: Diagnosis not present

## 2023-09-17 DIAGNOSIS — Z79899 Other long term (current) drug therapy: Secondary | ICD-10-CM | POA: Insufficient documentation

## 2023-09-17 DIAGNOSIS — Y92096 Garden or yard of other non-institutional residence as the place of occurrence of the external cause: Secondary | ICD-10-CM | POA: Diagnosis not present

## 2023-09-17 DIAGNOSIS — Z043 Encounter for examination and observation following other accident: Secondary | ICD-10-CM | POA: Diagnosis not present

## 2023-09-17 DIAGNOSIS — N183 Chronic kidney disease, stage 3 unspecified: Secondary | ICD-10-CM | POA: Insufficient documentation

## 2023-09-17 DIAGNOSIS — W01198A Fall on same level from slipping, tripping and stumbling with subsequent striking against other object, initial encounter: Secondary | ICD-10-CM | POA: Diagnosis not present

## 2023-09-17 DIAGNOSIS — S8002XA Contusion of left knee, initial encounter: Secondary | ICD-10-CM | POA: Diagnosis not present

## 2023-09-17 NOTE — ED Provider Notes (Signed)
[facility] Provider Note    Event Date/Time   First MD Initiated Contact with Patient 09/17/23 1236     (approximate)   History   Fall   HPI  Laura Rojas is a 77 y.o. female who presents today for evaluation after a head injury sustained yesterday.  Patient reports that she was throwing pumpkin doorways in the yard for her great grandchildren to pick up when she lost her balance and fell in the driveway, striking her head.  She denies LOC.  She reports that she scraped her knee in the process as well but reports "that is fine."  She has been ambulatory the steady gait.  She has not had any vomiting, difficulty walking or speaking, numbness or tingling since the fall.  She reports that she has a mild left-sided headache.  She called her PCP who advised that she come to the emergency department given that she is on a blood thinner.  Patient Active Problem List   Diagnosis Date Noted   Cirrhosis, non-alcoholic (HCC) 02/06/2023   Elevated amylase and lipase 09/09/2022   Aortic atherosclerosis (HCC) 07/30/2022   Umbilical hernia without obstruction and without gangrene 07/30/2022   B12 deficiency 01/11/2021   Long-term current use of benzodiazepine 03/17/2020   Gastroesophageal reflux disease without esophagitis 12/17/2019   Type 2 diabetes mellitus with hyperglycemia, with long-term current use of insulin (HCC) 11/09/2019   Rheumatoid arthritis of multiple sites with negative rheumatoid factor (HCC) 10/20/2019   Polyp of transverse colon    Coronary artery disease of bypass graft of native heart with stable angina pectoris (HCC) 07/17/2019   Depression, recurrent (HCC) 10/15/2018   Polymyalgia rheumatica (HCC) 05/15/2018   Restless leg syndrome 10/16/2017   Advanced care planning/counseling discussion 08/23/2017   Insomnia 05/24/2017   CKD stage 3 due to type 2 diabetes mellitus (HCC) 04/13/2016   Fe deficiency anemia 12/01/2015   Anxiety disorder  05/26/2015   Subclavian artery stenosis, left (HCC) 01/01/2015   Hypertension associated with diabetes (HCC) 12/31/2013   Hyperlipidemia associated with type 2 diabetes mellitus (HCC) 12/31/2013          Physical Exam   Triage Vital Signs: ED Triage Vitals [09/17/23 1107]  Encounter Vitals Group     BP 134/83     Systolic BP Percentile      Diastolic BP Percentile      Pulse Rate 60     Resp 18     Temp 98 F (36.7 C)     Temp src      SpO2 98 %     Weight      Height      Head Circumference      Peak Flow      Pain Score 5     Pain Loc      Pain Education      Exclude from Growth Chart     Most recent vital signs: Vitals:   09/17/23 1107  BP: 134/83  Pulse: 60  Resp: 18  Temp: 98 F (36.7 C)  SpO2: 98%    Physical Exam Vitals and nursing note reviewed.  Constitutional:      General: Awake and alert. No acute distress.    Appearance: Normal appearance. The patient is normal weight.  HENT:     Head: Normocephalic and atraumatic.  No hematoma or laceration noted.    Mouth: Mucous membranes are moist.  Eyes:     General: PERRL. Normal EOMs  Right eye: No discharge.        Left eye: No discharge.     Conjunctiva/sclera: Conjunctivae normal.  Cardiovascular:     Rate and Rhythm: Normal rate and regular rhythm.     Pulses: Normal pulses.  Pulmonary:     Effort: Pulmonary effort is normal. No respiratory distress.     Breath sounds: Normal breath sounds.  Abdominal:     Abdomen is soft. There is no abdominal tenderness. No rebound or guarding. No distention. Musculoskeletal:        General: No swelling. Normal range of motion.     Cervical back: Normal range of motion and neck supple. No midline cervical spine tenderness.  Full range of motion of neck.  Negative Spurling test.  Negative Lhermitte sign.  Normal strength and sensation in bilateral upper extremities. Normal grip strength bilaterally.  Normal intrinsic muscle function of the hand  bilaterally.  Normal radial pulses bilaterally. Left knee: No deformity, superficial abrasions noted. No joint line tenderness. No patellar tenderness, no ballotment Warm and well perfused extremity with 2+ pedal pulses 5/5 strength to dorsiflexion and plantarflexion at the ankle with intact sensation throughout extremity Normal range of motion of the knee, with intact flexion and extension to active and passive range of motion. Extensor mechanism intact. No ligamentous laxity. Negative anterior/posterior drawer/negative lachman, negative mcmurrays No effusion or warmth Intact quadriceps, hamstring function, patellar tendon function Pelvis stable Full ROM of ankle without pain or swelling Foot warm and well perfused Skin:    General: Skin is warm and dry.     Capillary Refill: Capillary refill takes less than 2 seconds.     Findings: No rash.  Neurological:     Mental Status: The patient is awake and alert.   Neurological: GCS 15 alert and oriented x3 Normal speech, no expressive or receptive aphasia or dysarthria Cranial nerves II through XII intact Normal visual fields 5 out of 5 strength in all 4 extremities with intact sensation throughout No extremity drift Normal finger-to-nose testing, no limb or truncal ataxia    ED Results / Procedures / Treatments   Labs (all labs ordered are listed, but only abnormal results are displayed) Labs Reviewed - No data to display   EKG     RADIOLOGY I independently reviewed and interpreted imaging and agree with radiologists findings.     PROCEDURES:  Critical Care performed:   Procedures   MEDICATIONS ORDERED IN ED: Medications - No data to display   IMPRESSION / MDM / ASSESSMENT AND PLAN / ED COURSE  I reviewed the triage vital signs and the nursing notes.   Differential diagnosis includes, but is not limited to, abrasion, contusion, concussion, intracranial hemorrhage, cervical spine fracture.  Patient is awake  and alert, hemodynamically stable and afebrile.  She is nontoxic in appearance.  She is neurologically neurovascularly intact.  There is no obvious hematoma, no scalp laceration.  CT head and neck obtained per [place] criteria are normal for any acute injuries.  She has no cervical spine tenderness, full and normal strength and sensation in bilateral upper extremities, normal grip strength, not consistent with central cord syndrome.  She is superficial abrasions to her left knee, though ambulatory to steady gait, denies any pain here, no ligamental laxity, no effusion, no warmth or erythema, no fever, do not suspect osseous injury or septic joint.  Patient is quite reassured by her results today.  We discussed return precautions and outpatient follow-up.  Patient understands and agrees with  plan.  She was discharged in stable condition.   Patient's presentation is most consistent with acute complicated illness / injury requiring diagnostic workup.      FINAL CLINICAL IMPRESSION(S) / ED DIAGNOSES   Final diagnoses:  Fall, initial encounter  Injury of head, initial encounter  Contusion of left knee, initial encounter     Rx / DC Orders   ED Discharge Orders     None        Note:  This document was prepared using Dragon voice recognition software and may include unintentional dictation errors.   Jackelyn Hoehn, PA-C 09/17/23 1305    Pilar Jarvis, MD 09/17/23 1524

## 2023-09-17 NOTE — Telephone Encounter (Signed)
Reason for Disposition  Large swelling or bruise > 2 inches (5 cm)  Answer Assessment - Initial Assessment Questions 1. MECHANISM: "How did the injury happen?" For falls, ask: "What height did you fall from?" and "What surface did you fall against?"        Patient fell out of small trailer behind lawn mower- hit head on gravel 2. ONSET: "When did the injury happen?" (Minutes or hours ago)      yesterday 3. NEUROLOGIC SYMPTOMS: "Was there any loss of consciousness?" "Are there any other neurological symptoms?"      No- sore only 4. MENTAL STATUS: "Does the person know who they are, who you are, and where they are?"      alert 5. LOCATION: "What part of the head was hit?"      Left side 6. SCALP APPEARANCE: "What does the scalp look like? Is it bleeding now?" If Yes, ask: "Is it difficult to stop?"      Scape on knee- no bleeding from head 7. SIZE: For cuts, bruises, or swelling, ask: "How large is it?" (e.g., inches or centimeters)      Possible bruising- sore 8. PAIN: "Is there any pain?" If Yes, ask: "How bad is it?"  (e.g., Scale 1-10; or mild, moderate, severe)     Moderate-5/10 9. TETANUS: For any breaks in the skin, ask: "When was the last tetanus booster?"     unsure 10. OTHER SYMPTOMS: "Do you have any other symptoms?" (e.g., neck pain, vomiting)       headache  Protocols used: Head Injury-A-AH

## 2023-09-17 NOTE — ED Triage Notes (Signed)
Pt comes with c/o fall yesterday. Pt states she slid off the trailer and hit her head on gravel. Pt denies any loc. Pt is on thinner. Pt had bruise to left knee.

## 2023-09-17 NOTE — Telephone Encounter (Signed)
Patient currently at ED

## 2023-09-17 NOTE — Telephone Encounter (Signed)
  Chief Complaint: head injury- fall Symptoms: large area bruising- sore- patient takes blood thinner Frequency: fell out of small wagon- hit head Pertinent Negatives: Patient denies neck pain, vomiting, blurred vision Disposition: [x] ED /[] Urgent Care (no appt availability in office) / [] Appointment(In office/virtual)/ []  [place] [facility]/ [] Home Care/ [] Refused Recommended Disposition /[] [place] [facility]/ []  Follow-up with PCP Additional Notes: Patient fell and hit her head- large bruising present, soreness - per disposition- ED recommended

## 2023-09-17 NOTE — ED Provider Triage Note (Signed)
Emergency Medicine Provider Triage Evaluation Note  KINDAL OHAGAN , a 77 y.o. female  was evaluated in triage.  Pt complains of headache after fall yesterday, on blood thinner.  Review of Systems  Positive:  Negative:   Physical Exam  BP 134/83   Pulse 60   Temp 98 F (36.7 C)   Resp 18   SpO2 98%  Gen:   Awake, no distress   Resp:  Normal effort  MSK:   Moves extremities without difficulty  Other:    Medical Decision Making  Medically screening exam initiated at 11:08 AM.  Appropriate orders placed.  CHARLETTA MASLAK was informed that the remainder of the evaluation will be completed by another provider, this initial triage assessment does not replace that evaluation, and the importance of remaining in the ED until their evaluation is complete.     Faythe Ghee, PA-C 09/17/23 1108

## 2023-09-17 NOTE — Discharge Instructions (Signed)
Your CT scans are normal.  Please return for any new, worsening, or change in symptoms or other concerns.  It was a pleasure caring for you today.

## 2023-09-25 DIAGNOSIS — M0609 Rheumatoid arthritis without rheumatoid factor, multiple sites: Secondary | ICD-10-CM | POA: Diagnosis not present

## 2023-10-16 ENCOUNTER — Ambulatory Visit: Payer: Medicare Other | Admitting: Emergency Medicine

## 2023-10-16 VITALS — Ht 65.0 in | Wt 155.0 lb

## 2023-10-16 DIAGNOSIS — Z0000 Encounter for general adult medical examination without abnormal findings: Secondary | ICD-10-CM | POA: Diagnosis not present

## 2023-10-16 DIAGNOSIS — Z780 Asymptomatic menopausal state: Secondary | ICD-10-CM | POA: Diagnosis not present

## 2023-10-16 DIAGNOSIS — Z1231 Encounter for screening mammogram for malignant neoplasm of breast: Secondary | ICD-10-CM

## 2023-10-16 DIAGNOSIS — E1165 Type 2 diabetes mellitus with hyperglycemia: Secondary | ICD-10-CM

## 2023-10-16 NOTE — Progress Notes (Signed)
Subjective:   Laura Rojas is a 77 y.o. female who presents for Medicare Annual (Subsequent) preventive examination.  Visit Complete: Virtual I connected with  [name] on 10/16/23 by a audio enabled telemedicine application and verified that I am speaking with the correct person using two identifiers.  Patient Location: Home  Provider Location: Office/Clinic  I discussed the limitations of evaluation and management by telemedicine. The patient expressed understanding and agreed to proceed.  Vital Signs: Because this visit was a virtual/telehealth visit, some criteria may be missing or patient reported. Any vitals not documented were not able to be obtained and vitals that have been documented are patient reported.   Cardiac Risk Factors include: advanced age (>47men, >45 women);diabetes mellitus;dyslipidemia;hypertension     Objective:    Today's Vitals   10/16/23 1502 10/16/23 1503  Weight: 155 lb (70.3 kg)   Height: 5\' 5"  (1.651 m)   PainSc:  4    Body mass index is 25.79 kg/m.     10/16/2023    3:20 PM 09/17/2023   11:07 AM 09/04/2022    8:38 AM 04/03/2022   10:47 AM 09/02/2021    8:18 AM 08/30/2020    8:18 AM 05/04/2020    9:37 AM  Advanced Directives  Does Patient Have a Medical Advance Directive? No No Yes No No No No  Type of Publishing rights manager of Healthcare Power of Attorney in Chart?   No - copy requested      Would patient like information on creating a medical advance directive? Yes (MAU/Ambulatory/Procedural Areas - Information given)      No - Patient declined    Current Medications (verified) Outpatient Encounter Medications as of 10/16/2023  Medication Sig   acetaminophen (TYLENOL) 500 MG tablet Take 500 mg by mouth 2 (two) times daily. For arthritis pain   amLODipine (NORVASC) 2.5 MG tablet Take 1 tablet (2.5 mg total) by mouth daily.   aspirin 81 MG tablet Take 81 mg by mouth daily.   atorvastatin  (LIPITOR) 40 MG tablet TAKE 1 TABLET BY MOUTH EVERY DAY** STOP THE SIMVASTATIN   certolizumab pegol (CIMZIA) 2 X 200 MG/ML PSKT Inject into the skin.   cholecalciferol (VITAMIN D3) 25 MCG (1000 UT) tablet Take 1,000 Units by mouth daily.   clobetasol cream (TEMOVATE) 0.05 % SPOT TREAT AFFECTED AREAS TWICE A DAY UNTIL IMPROVED. AVOID FACE, GROIN, AXILLA.   clopidogrel (PLAVIX) 75 MG tablet Take 1 tablet (75 mg total) by mouth daily.   Continuous Blood Gluc Sensor (FREESTYLE LIBRE 14 DAY SENSOR) MISC USE 1 KIT EVERY 14 (FOURTEEN) DAYS   hydrochlorothiazide (MICROZIDE) 12.5 MG capsule TAKE 1 CAPSULE BY MOUTH EVERY DAY   insulin aspart (NOVOLOG FLEXPEN) 100 UNIT/ML FlexPen INJECT 20 UNITS INTO THE SKIN 3 (THREE) TIMES DAILY. INJECT PER SLIDING SCALE REGIMEN.   Insulin Glargine (BASAGLAR KWIKPEN) 100 UNIT/ML Inject 16 Units into the skin at bedtime.   Insulin Pen Needle (BD PEN NEEDLE NANO 2ND GEN) 32G X 4 MM MISC USE 1 EACH 4 (FOUR) TIMES DAILY   LORazepam (ATIVAN) 0.5 MG tablet TAKE 1 TAB BY MOUTH TWICE A DAY (USE THE ADDITIONAL 30 TABS ONLY SPARINGLY & ONLY FOR SEVERE ANXIETY)   losartan (COZAAR) 100 MG tablet Take 1 tablet (100 mg total) by mouth daily.   Magnesium 400 MG TABS Take 1 tablet by mouth daily.   metFORMIN (GLUCOPHAGE) 1000 MG tablet Take 0.5 tablets (  500 mg total) by mouth 2 (two) times daily.   mometasone (ELOCON) 0.1 % cream APPLY 1 APPLICATION TOPICALLY DAILY AS NEEDED (RASH).   Multiple Vitamin (MULTIVITAMIN) tablet Take 1 tablet by mouth daily.   Omega-3 Fatty Acids (FISH OIL) 1200 MG CAPS Take 1,200 mg by mouth daily.   pantoprazole (PROTONIX) 40 MG tablet TAKE 1 TABLET BY MOUTH EVERY DAY   sertraline (ZOLOFT) 100 MG tablet TAKE 1.5 TABLETS BY MOUTH DAILY   valACYclovir (VALTREX) 1000 MG tablet TAKE 1 TABLET (1,000 MG TOTAL) BY MOUTH DAILY FOR 7 DAYS.   vitamin B-12 (CYANOCOBALAMIN) 1000 MCG tablet Take 1,000 mcg by mouth daily.   VITRON-C 65-125 MG TABS TAKE 1 TABLET BY MOUTH  EVERY DAY   No facility-administered encounter medications on file as of 10/16/2023.    Allergies (verified) Compazine [prochlorperazine edisylate], Altace [ramipril], Crestor [rosuvastatin calcium], Reglan [metoclopramide], Sulfa antibiotics, Actos [pioglitazone], Amlodipine, Latex, and Lipitor [atorvastatin]   History: Past Medical History:  Diagnosis Date   Arthritis    Shoulders, hands, feet   CAD (coronary artery disease) 12/31/2013   2007, 3.0 x 28 mm drug-eluting Cypher stent to RCA, 3.5 x 33 mm drug-eluting Cypher stent and proximal LAD) Consent by 2009 Normal perfusion by mildly 2013    Diabetes mellitus without complication (HCC)    Dyslipidemia    GERD (gastroesophageal reflux disease)    Hypertension    Polymyalgia rheumatica syndrome (HCC)    Subclavian artery stenosis, left (HCC) 01/01/2015   Past Surgical History:  Procedure Laterality Date   BREAST EXCISIONAL BIOPSY Right 1970   EXCISIONAL - NEG   BREAST EXCISIONAL BIOPSY Left 1973   EXCISIONAL - NEG   BREAST SURGERY     CARDIAC CATHETERIZATION  08/25/2008   patent stents   CAROTID DUPLEX  10/2006   NORMAL CAROTID ARTERY DUPLEX   CATARACT EXTRACTION W/PHACO Left 04/13/2020   Procedure: CATARACT EXTRACTION PHACO AND INTRAOCULAR LENS PLACEMENT (IOC) LEFT DIABETIC 4.69  00:44.0;  Surgeon: Galen Manila, MD;  Location: [facility];  Service: Ophthalmology;  Laterality: Left;  Diabetic - insulin and oral meds   CATARACT EXTRACTION W/PHACO Right 05/04/2020   Procedure: CATARACT EXTRACTION PHACO AND INTRAOCULAR LENS PLACEMENT (IOC) RIGHT DIABETIC 3.92  00:27.1;  Surgeon: Galen Manila, MD;  Location: [facility];  Service: Ophthalmology;  Laterality: Right;  Diabetic - insulin and oral meds   COLONOSCOPY WITH PROPOFOL N/A 08/19/2019   Procedure: COLONOSCOPY WITH PROPOFOL;  Surgeon: Midge Minium, MD;  Location: [facility] ENDOSCOPY;  Service: Endoscopy;  Laterality: N/A;   CORONARY ANGIOPLASTY WITH STENT  PLACEMENT  09/17/2006   PTCA & stenting LAD   ESOPHAGOGASTRODUODENOSCOPY N/A 04/27/2015   Procedure: ESOPHAGOGASTRODUODENOSCOPY (EGD);  Surgeon: Midge Minium, MD;  Location: [facility] ENDOSCOPY;  Service: Endoscopy;  Laterality: N/A;   JOINT REPLACEMENT     NM MYOCAR MULTIPLE W/SPECT  11/2011   EF 72%.NORMAL MYOCARDIAL PERFUSION STUDY   REPLACEMENT TOTAL KNEE  2003 & 2004   TRANSTHORACIC ECHOCARDIOGRAM  09/2006   EF =>55%. IMPAIRED LV RELAXATION. MILD MITRAL ANNULAR CALCIFICATION. AV-MILDLY SCLEROTIC. NO AS.   VAGINAL HYSTERECTOMY  1983   Family History  Problem Relation Age of Onset   Breast cancer Mother 2   Arthritis Father    Cirrhosis Father        non-alcoholic   Diabetes Sister    Heart disease Maternal Grandmother    Heart disease Maternal Grandfather    Brain cancer Grandchild    Social History   Socioeconomic History  Marital status: Married    Spouse name: Molly Maduro   Number of children: 2   Years of education: Not on file   Highest education level: Some college, no degree  Occupational History   Occupation: retired  Tobacco Use   Smoking status: Never   Smokeless tobacco: Never  Vaping Use   Vaping status: Never Used  Substance and Sexual Activity   Alcohol use: No    Alcohol/week: 0.0 standard drinks of alcohol   Drug use: Never   Sexual activity: Not on file  Other Topics Concern   Not on file  Social History Narrative   Not on file   Social Determinants of Health   Financial Resource Strain: Low Risk  (10/16/2023)   Overall Financial Resource Strain (CARDIA)    Difficulty of Paying Living Expenses: Not hard at all  Food Insecurity: No Food Insecurity (10/16/2023)   Hunger Vital Sign    Worried About Running Out of Food in the Last Year: Never true    Ran Out of Food in the Last Year: Never true  Transportation Needs: No Transportation Needs (10/16/2023)   PRAPARE - Administrator, Civil Service (Medical): No    Lack of Transportation  (Non-Medical): No  Physical Activity: Sufficiently Active (10/16/2023)   Exercise Vital Sign    Days of Exercise per Week: 3 days    Minutes of Exercise per Session: 50 min  Stress: No Stress Concern Present (10/16/2023)   [employer] of Occupational Health - Occupational Stress Questionnaire    Feeling of Stress : Not at all  Social Connections: Socially Integrated (10/16/2023)   Social Connection and Isolation Panel [NHANES]    Frequency of Communication with Friends and Family: More than three times a week    Frequency of Social Gatherings with Friends and Family: Three times a week    Attends Religious Services: More than 4 times per year    Active Member of Clubs or Organizations: Yes    Attends Engineer, structural: More than 4 times per year    Marital Status: Married    Tobacco Counseling Counseling given: Not Answered   Clinical Intake:  Pre-visit preparation completed: Yes  Pain : 0-10 Pain Score: 4  Pain Type: Chronic pain Pain Location: Finger (Comment which one) Pain Descriptors / Indicators: Aching     BMI - recorded: 25.79 Nutritional Status: BMI 25 -29 Overweight Nutritional Risks: None Diabetes: Yes CBG done?: No Did pt. bring in CBG monitor from home?: No  How often do you need to have someone help you when you read instructions, pamphlets, or other written materials from your doctor or pharmacy?: 1 - Never  Interpreter Needed?: No  Information entered by :: Tora Kindred, CMA   Activities of Daily Living    10/16/2023    3:06 PM  In your present state of health, do you have any difficulty performing the following activities:  Hearing? 0  Vision? 0  Difficulty concentrating or making decisions? 0  Walking or climbing stairs? 0  Dressing or bathing? 0  Doing errands, shopping? 0  Preparing Food and eating ? N  Using the Toilet? N  In the past six months, have you accidently leaked urine? N  Do you have problems with loss of  bowel control? N  Managing your Medications? N  Managing your Finances? N  Housekeeping or managing your Housekeeping? N    Patient Care Team: Marjie Skiff, NP as PCP - General (Nurse Practitioner)  Croitoru, Mihai, MD as PCP - Cardiology (Cardiology) Dayna Barker, MD as Referring Physician (Internal Medicine) Otelia Sergeant, NP (Inactive) as Nurse Practitioner (Surgery) Dahlia Byes, [facility] (Inactive) as Pharmacist (Pharmacist)  Indicate any recent Medical Services you may have received from other than [facility] providers in the past year (date may be approximate).     Assessment:   This is a routine wellness examination for Laura Rojas.  Hearing/Vision screen Hearing Screening - Comments:: Denies hearing loss Vision Screening - Comments:: Gets eye exams   Goals Addressed               This Visit's Progress     Patient Stated (pt-stated)        Drink more water and exercise more routinely      Depression Screen    10/16/2023    3:18 PM 09/11/2023   10:49 AM 06/08/2023   11:15 AM 03/09/2023   11:00 AM 12/08/2022   11:07 AM 10/16/2022    3:30 PM 09/07/2022    9:24 AM  PHQ 2/9 Scores  PHQ - 2 Score 0 0 0 0 0 0 0  PHQ- 9 Score 1 3 4 3 1 2 1     Fall Risk    10/16/2023    3:21 PM 09/11/2023   10:49 AM 06/08/2023   11:15 AM 03/09/2023   10:59 AM 12/08/2022   11:07 AM  Fall Risk   Falls in the past year? 1 0 0 0 1  Number falls in past yr: 1 0 0 0 1  Injury with Fall? 0 0 0 0 1  Risk for fall due to : History of fall(s);Impaired balance/gait;Orthopedic patient No Fall Risks No Fall Risks No Fall Risks History of fall(s)  Follow up Education provided;Falls prevention discussed;Falls evaluation completed Falls evaluation completed Falls evaluation completed Falls evaluation completed Falls evaluation completed    MEDICARE RISK AT HOME: Medicare Risk at Home Any stairs in or around the home?: Yes If so, are there any without handrails?: No Home free of  loose throw rugs in walkways, pet beds, electrical cords, etc?: Yes Adequate lighting in your home to reduce risk of falls?: Yes Life alert?: No Use of a cane, walker or w/c?: No Grab bars in the bathroom?: Yes Shower chair or bench in shower?: Yes Elevated toilet seat or a handicapped toilet?: Yes  TIMED UP AND GO:  Was the test performed?  No    Cognitive Function:        10/16/2023    3:23 PM 09/04/2022    8:37 AM 09/02/2021    8:21 AM 08/30/2020    8:21 AM 12/17/2019   10:47 AM  6CIT Screen  What Year? 0 points 0 points 0 points 0 points 0 points  What month? 0 points 0 points 0 points 0 points 0 points  What time? 0 points 0 points 0 points 0 points 0 points  Count back from 20 0 points 0 points 0 points 0 points 0 points  Months in reverse 0 points 0 points 0 points 0 points 0 points  Repeat phrase 0 points 0 points 0 points 0 points 0 points  Total Score 0 points 0 points 0 points 0 points 0 points    Immunizations Immunization History  Administered Date(s) Administered   Fluad Quad(high Dose 65+) 09/14/2021, 09/13/2022   Fluad Trivalent(High Dose 65+) 09/11/2023   Influenza, High Dose Seasonal PF 09/04/2016, 08/23/2017, 08/28/2018, 07/14/2019, 09/03/2020   Influenza,inj,Quad PF,6+ Mos 09/27/2015   Influenza-Unspecified 09/18/2014  PFIZER(Purple Top)SARS-COV-2 Vaccination 12/04/2019, 12/25/2019, 09/01/2020   Pneumococcal Conjugate-13 04/22/2014   Pneumococcal Polysaccharide-23 11/12/2008, 07/05/2016   Td 12/21/2009, 03/17/2020   Zoster Recombinant(Shingrix) 07/14/2019, 10/15/2019    TDAP status: Up to date  Flu Vaccine status: Up to date  Pneumococcal vaccine status: Up to date  Covid-19 vaccine status: Declined, Education has been provided regarding the importance of this vaccine but patient still declined. Advised may receive this vaccine at local pharmacy or Health Dept.or vaccine clinic. Aware to provide a copy of the vaccination record if obtained from  local pharmacy or Health Dept. Verbalized acceptance and understanding.  Qualifies for Shingles Vaccine? Yes   Zostavax completed No   Shingrix Completed?: Yes  Screening Tests Health Maintenance  Topic Date Due   COVID-19 Vaccine (4 - 2023-24 season) 07/29/2023   OPHTHALMOLOGY EXAM  11/30/2023   Diabetic kidney evaluation - Urine ACR  12/09/2023   FOOT EXAM  03/08/2024   HEMOGLOBIN A1C  03/11/2024   DEXA SCAN  05/13/2024   Colonoscopy  08/18/2024   Diabetic kidney evaluation - eGFR measurement  09/10/2024   Medicare Annual Wellness (AWV)  10/15/2024   DTaP/Tdap/Td (3 - Tdap) 03/17/2030   Pneumonia Vaccine 44+ Years old  Completed   INFLUENZA VACCINE  Completed   Hepatitis C Screening  Completed   Zoster Vaccines- Shingrix  Completed   HPV VACCINES  Aged Out    Health Maintenance  Health Maintenance Due  Topic Date Due   COVID-19 Vaccine (4 - 2023-24 season) 07/29/2023    Colorectal cancer screening: No longer required.   Mammogram status: Completed 11/29/22. Repeat every year  Bone Density status: Completed 05/13/14. Results reflect: Bone density results: NORMAL. Repeat every 10 years.  Lung Cancer Screening: (Low Dose CT Chest recommended if Age 63-80 years, 20 pack-year currently smoking OR have quit w/in 15years.) does not qualify.   Lung Cancer Screening Referral: n/a  Additional Screening:  Hepatitis C Screening: does not qualify; Completed 07/05/16  Vision Screening: Recommended annual ophthalmology exams for early detection of glaucoma and other disorders of the eye. Is the patient up to date with their annual eye exam?  Yes  Who is the provider or what is the name of the office in which the patient attends annual eye exams? [facility], [place] If pt is not established with a provider, would they like to be referred to a provider to establish care? No .   Dental Screening: Recommended annual dental exams for proper oral hygiene  Diabetic Foot Exam:  Diabetic Foot Exam: Completed 03/09/23  Community Resource Referral / Chronic Care Management: CRR required this visit?  No   CCM required this visit?  No     Plan:     I have personally reviewed and noted the following in the patient's chart:   Medical and social history Use of alcohol, tobacco or illicit drugs  Current medications and supplements including opioid prescriptions. Patient is not currently taking opioid prescriptions. Functional ability and status Nutritional status Physical activity Advanced directives List of other physicians Hospitalizations, surgeries, and ER visits in previous 12 months Vitals Screenings to include cognitive, depression, and falls Referrals and appointments  In addition, I have reviewed and discussed with patient certain preventive protocols, quality metrics, and best practice recommendations. A written personalized care plan for preventive services as well as general preventive health recommendations were provided to patient.     Tora Kindred, CMA   10/16/2023   After Visit Summary: (Mail) Due to this  being a telephonic visit, the after visit summary with patients personalized plan was offered to patient via mail   Nurse Notes:  Placed referral to DM & Nutrition Education Placed orders for MMG and DEXA to be done after 11/30/23 Declined covid vaccine Diabetic eye exam due after 11/30/23.

## 2023-10-16 NOTE — Patient Instructions (Addendum)
Laura Rojas , Thank you for taking time to come for your Medicare Wellness Visit. I appreciate your ongoing commitment to your health goals. Please review the following plan we discussed and let me know if I can assist you in the future.   Referrals/Orders/Follow-Ups/Clinician Recommendations:  I have placed a referral for diabetes and nutrition education. Someone should call you to schedule an appointment. I have placed orders for a mammogram and bone density scan. These are due after 11/30/23. Call [facility] to schedule @ [phone]. Your next diabetic eye exam will be due after 11/30/23. Call [facility] to schedule this at your earliest convenience.  This is a list of the screening recommended for you and due dates:  Health Maintenance  Topic Date Due   COVID-19 Vaccine (4 - 2023-24 season) 07/29/2023   Mammogram  11/30/2023   Eye exam for diabetics  11/30/2023   Yearly kidney health urinalysis for diabetes  12/09/2023   Complete foot exam   03/08/2024   Hemoglobin A1C  03/11/2024   DEXA scan (bone density measurement)  05/13/2024   Colon Cancer Screening  08/18/2024   Yearly kidney function blood test for diabetes  09/10/2024   Medicare Annual Wellness Visit  10/15/2024   DTaP/Tdap/Td vaccine (3 - Tdap) 03/17/2030   Pneumonia Vaccine  Completed   Flu Shot  Completed   Hepatitis C Screening  Completed   Zoster (Shingles) Vaccine  Completed   HPV Vaccine  Aged Out    Advanced directives: (Provided) Advance directive discussed with you today. I have provided a copy for you to complete at home and have notarized. Once this is complete, please bring a copy in to our office so we can scan it into your chart.   Next Medicare Annual Wellness Visit scheduled for next year: Yes, 10/21/24 @ 1:50pm  Fall Prevention in the Home, Adult Falls can cause injuries and affect people of all ages. There are many simple things that you can do to make your home safe and to help prevent  falls. If you need it, ask for help making these changes. What actions can I take to prevent falls? General information Use good lighting in all rooms. Make sure to: Replace any light bulbs that burn out. Turn on lights if it is dark and use night-lights. Keep items that you use often in easy-to-reach places. Lower the shelves around your home if needed. Move furniture so that there are clear paths around it. Do not keep throw rugs or other things on the floor that can make you trip. If any of your floors are uneven, fix them. Add color or contrast paint or tape to clearly mark and help you see: Grab bars or handrails. First and last steps of staircases. Where the edge of each step is. If you use a ladder or stepladder: Make sure that it is fully opened. Do not climb a closed ladder. Make sure the sides of the ladder are locked in place. Have someone hold the ladder while you use it. Know where your pets are as you move through your home. What can I do in the bathroom?     Keep the floor dry. Clean up any water that is on the floor right away. Remove soap buildup in the bathtub or shower. Buildup makes bathtubs and showers slippery. Use non-skid mats or decals on the floor of the bathtub or shower. Attach bath mats securely with double-sided, non-slip rug tape. If you need to sit down while  you are in the shower, use a non-slip stool. Install grab bars by the toilet and in the bathtub and shower. Do not use towel bars as grab bars. What can I do in the bedroom? Make sure that you have a light by your bed that is easy to reach. Do not use any sheets or blankets on your bed that hang to the floor. Have a firm bench or chair with side arms that you can use for support when you get dressed. What can I do in the kitchen? Clean up any spills right away. If you need to reach something above you, use a sturdy step stool that has a grab bar. Keep electrical cables out of the way. Do not  use floor polish or wax that makes floors slippery. What can I do with my stairs? Do not leave anything on the stairs. Make sure that you have a light switch at the top and the bottom of the stairs. Have them installed if you do not have them. Make sure that there are handrails on both sides of the stairs. Fix handrails that are broken or loose. Make sure that handrails are as long as the staircases. Install non-slip stair treads on all stairs in your home if they do not have carpet. Avoid having throw rugs at the top or bottom of stairs, or secure the rugs with carpet tape to prevent them from moving. Choose a carpet design that does not hide the edge of steps on the stairs. Make sure that carpet is firmly attached to the stairs. Fix any carpet that is loose or worn. What can I do on the outside of my home? Use bright outdoor lighting. Repair the edges of walkways and driveways and fix any cracks. Clear paths of anything that can make you trip, such as tools or rocks. Add color or contrast paint or tape to clearly mark and help you see high doorway thresholds. Trim any bushes or trees on the main path into your home. Check that handrails are securely fastened and in good repair. Both sides of all steps should have handrails. Install guardrails along the edges of any raised decks or porches. Have leaves, snow, and ice cleared regularly. Use sand, salt, or ice melt on walkways during winter months if you live where there is ice and snow. In the garage, clean up any spills right away, including grease or oil spills. What other actions can I take? Review your medicines with your health care provider. Some medicines can make you confused or feel dizzy. This can increase your chance of falling. Wear closed-toe shoes that fit well and support your feet. Wear shoes that have rubber soles and low heels. Use a cane, walker, scooter, or crutches that help you move around if needed. Talk with your provider  about other ways that you can decrease your risk of falls. This may include seeing a physical therapist to learn to do exercises to improve movement and strength. Where to find more information Centers for Disease Control and Prevention, STEADI: TonerPromos.no [employer] on Aging: BaseRingTones.pl National Institute on Aging: BaseRingTones.pl Contact a health care provider if: You are afraid of falling at home. You feel weak, drowsy, or dizzy at home. You fall at home. Get help right away if you: Lose consciousness or have trouble moving after a fall. Have a fall that causes a head injury. These symptoms may be an emergency. Get help right away. Call 911. Do not wait to see if the  symptoms will go away. Do not drive yourself to the hospital. This information is not intended to replace advice given to you by your health care provider. Make sure you discuss any questions you have with your health care provider. Document Revised: 07/17/2022 Document Reviewed: 07/17/2022 Elsevier Patient Education  2024 [employer].

## 2023-10-18 DIAGNOSIS — E785 Hyperlipidemia, unspecified: Secondary | ICD-10-CM | POA: Diagnosis not present

## 2023-10-18 DIAGNOSIS — I10 Essential (primary) hypertension: Secondary | ICD-10-CM | POA: Diagnosis not present

## 2023-10-18 DIAGNOSIS — E1169 Type 2 diabetes mellitus with other specified complication: Secondary | ICD-10-CM | POA: Diagnosis not present

## 2023-10-18 DIAGNOSIS — K5730 Diverticulosis of large intestine without perforation or abscess without bleeding: Secondary | ICD-10-CM | POA: Diagnosis not present

## 2023-10-18 DIAGNOSIS — E538 Deficiency of other specified B group vitamins: Secondary | ICD-10-CM | POA: Diagnosis not present

## 2023-10-18 DIAGNOSIS — M353 Polymyalgia rheumatica: Secondary | ICD-10-CM | POA: Diagnosis not present

## 2023-10-18 DIAGNOSIS — R768 Other specified abnormal immunological findings in serum: Secondary | ICD-10-CM | POA: Diagnosis not present

## 2023-10-18 DIAGNOSIS — M0609 Rheumatoid arthritis without rheumatoid factor, multiple sites: Secondary | ICD-10-CM | POA: Diagnosis not present

## 2023-10-18 DIAGNOSIS — Z794 Long term (current) use of insulin: Secondary | ICD-10-CM | POA: Diagnosis not present

## 2023-10-18 DIAGNOSIS — K7460 Unspecified cirrhosis of liver: Secondary | ICD-10-CM | POA: Diagnosis not present

## 2023-10-18 DIAGNOSIS — R1012 Left upper quadrant pain: Secondary | ICD-10-CM | POA: Diagnosis not present

## 2023-10-19 ENCOUNTER — Other Ambulatory Visit: Payer: Self-pay | Admitting: Internal Medicine

## 2023-10-19 DIAGNOSIS — K7460 Unspecified cirrhosis of liver: Secondary | ICD-10-CM

## 2023-10-23 DIAGNOSIS — M0609 Rheumatoid arthritis without rheumatoid factor, multiple sites: Secondary | ICD-10-CM | POA: Diagnosis not present

## 2023-10-24 ENCOUNTER — Ambulatory Visit
Admission: RE | Admit: 2023-10-24 | Discharge: 2023-10-24 | Disposition: A | Discharge status: Home / Self Care | Payer: Medicare Other | Source: Ambulatory Visit | Attending: Internal Medicine | Admitting: Internal Medicine

## 2023-10-24 DIAGNOSIS — K8020 Calculus of gallbladder without cholecystitis without obstruction: Secondary | ICD-10-CM | POA: Diagnosis not present

## 2023-10-24 DIAGNOSIS — K7460 Unspecified cirrhosis of liver: Secondary | ICD-10-CM | POA: Diagnosis not present

## 2023-10-24 DIAGNOSIS — K7689 Other specified diseases of liver: Secondary | ICD-10-CM | POA: Diagnosis not present

## 2023-11-02 ENCOUNTER — Other Ambulatory Visit: Payer: Self-pay | Admitting: Nurse Practitioner

## 2023-11-05 NOTE — Telephone Encounter (Signed)
Requested medications are due for refill today.  Unsure  Requested medications are on the active medications list.  yes  Last refill. 09/11/2023 #45 [id number] rf  Future visit scheduled.   yes  Notes to clinic.   Alternative Requested:PLEASE SEND A NEW PRESCRIPTION WITH MAX DOSE PER DAY. PATIENT STATES SHE IS USING MORE THAN 20 UNITS PER SHOT SOMETIMES.    Requested Prescriptions  Pending Prescriptions Disp Refills   Insulin Aspart FlexPen (NOVOLOG) 100 UNIT/ML [Pharmacy Med Name: INSULIN ASPART 100 UNIT/ML PEN]  8    Sig: INJECT 20 UNITS INTO THE SKIN 3 (THREE) TIMES DAILY. INJECT PER SLIDING SCALE REGIMEN.     Endocrinology:  Diabetes - Insulins Passed - 11/02/2023  2:40 PM      Passed - HBA1C is between 0 and 7.9 and within 180 days    Hemoglobin A1C  Date Value Ref Range Status  12/31/2019 8.2  Final   HB A1C (BAYER DCA - WAIVED)  Date Value Ref Range Status  06/08/2023 7.5 (H) 4.8 - 5.6 % Final    Comment:             Prediabetes: 5.7 - 6.4          Diabetes: >6.4          Glycemic control for adults with diabetes: <7.0    Hgb A1c MFr Bld  Date Value Ref Range Status  09/11/2023 7.2 (H) 4.8 - 5.6 % Final    Comment:             Prediabetes: 5.7 - 6.4          Diabetes: >6.4          Glycemic control for adults with diabetes: <7.0          Passed - Valid encounter within last 6 months    Recent Outpatient Visits           1 month ago Type 2 diabetes mellitus with hyperglycemia, with long-term current use of insulin (HCC)   [facility] [place], Eitzen T, NP   5 months ago Type 2 diabetes mellitus with hyperglycemia, with long-term current use of insulin (HCC)   [place] [facility] [place], Hazelton T, NP   8 months ago Type 2 diabetes mellitus with hyperglycemia, with long-term current use of insulin (HCC)   [facility] [place], [place] T, NP   11 months ago Type 2 diabetes mellitus with hyperglycemia, with  long-term current use of insulin (HCC)   [facility] [place], Corrie Dandy T, NP   1 year ago LUQ abdominal pain   [place] [facility] [place], Dorie Rank, NP       Future Appointments             In 1 month [place], Dorie Rank, NP [place] [facility]   In 2 months Deirdre Evener, MD [place] [facility]   In 2 months [place], Rachelle Hora, MD [facility]   In 10 months Deirdre Evener, MD [facility]

## 2023-11-06 DIAGNOSIS — M0609 Rheumatoid arthritis without rheumatoid factor, multiple sites: Secondary | ICD-10-CM | POA: Diagnosis not present

## 2023-11-06 DIAGNOSIS — M150 Primary generalized (osteo)arthritis: Secondary | ICD-10-CM | POA: Diagnosis not present

## 2023-11-06 DIAGNOSIS — Z79899 Other long term (current) drug therapy: Secondary | ICD-10-CM | POA: Diagnosis not present

## 2023-11-20 DIAGNOSIS — M0609 Rheumatoid arthritis without rheumatoid factor, multiple sites: Secondary | ICD-10-CM | POA: Diagnosis not present

## 2023-11-27 ENCOUNTER — Other Ambulatory Visit: Payer: Self-pay | Admitting: Nurse Practitioner

## 2023-12-01 NOTE — Telephone Encounter (Signed)
 Requested medication (s) are due for refill today: Yes  Requested medication (s) are on the active medication list: Yes  Last refill:  09/10/23  Future visit scheduled: Yes  Notes to clinic:  Unable to refill per protocol, cannot delegate.      Requested Prescriptions  Pending Prescriptions Disp Refills   LORazepam  (ATIVAN ) 0.5 MG tablet [Pharmacy Med Name: LORAZEPAM  0.5 MG TABLET] 180 tablet 0    Sig: TAKE 1 TAB BY MOUTH TWICE A DAY (USE THE ADDITIONAL 30 TABS ONLY SPARINGLY & ONLY FOR SEVERE ANXIETY)     Not Delegated - Psychiatry: Anxiolytics/Hypnotics 2 Failed - 12/01/2023 10:29 AM      Failed - This refill cannot be delegated      Passed - Urine Drug Screen completed in last 360 days      Passed - Patient is not pregnant      Passed - Valid encounter within last 6 months    Recent Outpatient Visits           2 months ago Type 2 diabetes mellitus with hyperglycemia, with long-term current use of insulin  (HCC)   [facility] [place], Deary T, NP   5 months ago Type 2 diabetes mellitus with hyperglycemia, with long-term current use of insulin  (HCC)   [facility] [place], Adrian T, NP   8 months ago Type 2 diabetes mellitus with hyperglycemia, with long-term current use of insulin  (HCC)   [facility] [place], [place]ivar T, NP   11 months ago Type 2 diabetes mellitus with hyperglycemia, with long-term current use of insulin  (HCC)   [facility] [facility] [place], Melanie T, NP   1 year ago LUQ abdominal pain   [place] [facility] , Melanie DASEN, NP       Future Appointments             In 1 week Valerio Melanie DASEN, NP [facility]   In 1 month Hester Alm BROCKS, MD [place] [facility]   In 1 month Croitoru, Mihai, MD [facility]   In 9 months Hester Alm BROCKS, MD [facility]

## 2023-12-03 NOTE — Telephone Encounter (Signed)
 Last office visit: 09/11/2023

## 2023-12-05 DIAGNOSIS — E11319 Type 2 diabetes mellitus with unspecified diabetic retinopathy without macular edema: Secondary | ICD-10-CM | POA: Diagnosis not present

## 2023-12-05 DIAGNOSIS — Z961 Presence of intraocular lens: Secondary | ICD-10-CM | POA: Diagnosis not present

## 2023-12-05 DIAGNOSIS — H43813 Vitreous degeneration, bilateral: Secondary | ICD-10-CM | POA: Diagnosis not present

## 2023-12-08 NOTE — Patient Instructions (Signed)
 Please call to schedule your mammogram and/or bone density: [facility]  Address: [address], [place], [place] [address] Phone: [phone]  [facility] [address] [place],  [place]  [address] Phone: [phone]   Diabetes Mellitus and Foot Care Diabetes, also called diabetes mellitus, may cause problems with your feet and legs because of poor blood flow (circulation). Poor circulation may make your skin: Become thinner and drier. Break more easily. Heal more slowly. Peel and crack. You may also have nerve damage (neuropathy). This can cause decreased feeling in your legs and feet. This means that you may not notice minor injuries to your feet that could lead to more serious problems. Finding and treating problems early is the best way to prevent future foot problems. How to care for your feet Foot hygiene  Wash your feet daily with warm water and mild soap. Do not use hot water. Then, pat your feet and the areas between your toes until they are fully dry. Do not soak your feet. This can dry your skin. Trim your toenails straight across. Do not dig under them or around the cuticle. File the edges of your nails with an emery board or nail file. Apply a moisturizing lotion or petroleum jelly to the skin on your feet and to dry, brittle toenails. Use lotion that does not contain alcohol and is unscented. Do not apply lotion between your toes. Shoes and socks Wear clean socks or stockings every day. Make sure they are not too tight. Do not wear knee-high stockings. These may decrease blood flow to your legs. Wear shoes that fit well and have enough cushioning. Always look in your shoes before you put them on to be sure there are no objects inside. To break in new shoes, wear them for just a few hours a day. This prevents injuries on your feet. Wounds, scrapes, corns, and calluses  Check your feet daily  for blisters, cuts, bruises, sores, and redness. If you cannot see the bottom of your feet, use a mirror or ask someone for help. Do not cut off corns or calluses or try to remove them with medicine. If you find a minor scrape, cut, or break in the skin on your feet, keep it and the skin around it clean and dry. You may clean these areas with mild soap and water. Do not clean the area with peroxide, alcohol, or iodine. If you have a wound, scrape, corn, or callus on your foot, look at it several times a day to make sure it is healing and not infected. Check for: Redness, swelling, or pain. Fluid or blood. Warmth. Pus or a bad smell. General tips Do not cross your legs. This may decrease blood flow to your feet. Do not use heating pads or hot water bottles on your feet. They may burn your skin. If you have lost feeling in your feet or legs, you may not know this is happening until it is too late. Protect your feet from hot and cold by wearing shoes, such as at the beach or on hot pavement. Schedule a complete foot exam at least once a year or more often if you have foot problems. Report any cuts, sores, or bruises to your health care provider right away. Where to find more information American Diabetes Association: diabetes.org Association of Diabetes Care & Education Specialists: diabeteseducator.org Contact a health care provider if: You have a condition that  increases your risk of infection, and you have any cuts, sores, or bruises on your feet. You have an injury that is not healing. You have redness on your legs or feet. You feel burning or tingling in your legs or feet. You have pain or cramps in your legs and feet. Your legs or feet are numb. Your feet always feel cold. You have pain around any toenails. Get help right away if: You have a wound, scrape, corn, or callus on your foot and: You have signs of infection. You have a fever. You have a red line going up your leg. This  information is not intended to replace advice given to you by your health care provider. Make sure you discuss any questions you have with your health care provider. Document Revised: 05/17/2022 Document Reviewed: 05/17/2022 Elsevier Patient Education  2024 [employer].

## 2023-12-11 ENCOUNTER — Other Ambulatory Visit: Payer: Self-pay | Admitting: Nurse Practitioner

## 2023-12-12 ENCOUNTER — Ambulatory Visit (INDEPENDENT_AMBULATORY_CARE_PROVIDER_SITE_OTHER): Payer: Medicare Other | Admitting: Nurse Practitioner

## 2023-12-12 ENCOUNTER — Encounter: Payer: Self-pay | Admitting: Nurse Practitioner

## 2023-12-12 VITALS — BP 129/80 | HR 62 | Temp 98.4°F | Ht 65.0 in | Wt 154.6 lb

## 2023-12-12 DIAGNOSIS — E1122 Type 2 diabetes mellitus with diabetic chronic kidney disease: Secondary | ICD-10-CM | POA: Diagnosis not present

## 2023-12-12 DIAGNOSIS — F339 Major depressive disorder, recurrent, unspecified: Secondary | ICD-10-CM | POA: Diagnosis not present

## 2023-12-12 DIAGNOSIS — K219 Gastro-esophageal reflux disease without esophagitis: Secondary | ICD-10-CM

## 2023-12-12 DIAGNOSIS — K7460 Unspecified cirrhosis of liver: Secondary | ICD-10-CM | POA: Diagnosis not present

## 2023-12-12 DIAGNOSIS — E538 Deficiency of other specified B group vitamins: Secondary | ICD-10-CM | POA: Diagnosis not present

## 2023-12-12 DIAGNOSIS — E1165 Type 2 diabetes mellitus with hyperglycemia: Secondary | ICD-10-CM | POA: Diagnosis not present

## 2023-12-12 DIAGNOSIS — I771 Stricture of artery: Secondary | ICD-10-CM

## 2023-12-12 DIAGNOSIS — I25708 Atherosclerosis of coronary artery bypass graft(s), unspecified, with other forms of angina pectoris: Secondary | ICD-10-CM

## 2023-12-12 DIAGNOSIS — E785 Hyperlipidemia, unspecified: Secondary | ICD-10-CM | POA: Diagnosis not present

## 2023-12-12 DIAGNOSIS — E1169 Type 2 diabetes mellitus with other specified complication: Secondary | ICD-10-CM | POA: Diagnosis not present

## 2023-12-12 DIAGNOSIS — E1159 Type 2 diabetes mellitus with other circulatory complications: Secondary | ICD-10-CM | POA: Diagnosis not present

## 2023-12-12 DIAGNOSIS — N183 Chronic kidney disease, stage 3 unspecified: Secondary | ICD-10-CM | POA: Diagnosis not present

## 2023-12-12 DIAGNOSIS — Z794 Long term (current) use of insulin: Secondary | ICD-10-CM

## 2023-12-12 DIAGNOSIS — M353 Polymyalgia rheumatica: Secondary | ICD-10-CM

## 2023-12-12 DIAGNOSIS — F5101 Primary insomnia: Secondary | ICD-10-CM

## 2023-12-12 DIAGNOSIS — F413 Other mixed anxiety disorders: Secondary | ICD-10-CM

## 2023-12-12 DIAGNOSIS — I152 Hypertension secondary to endocrine disorders: Secondary | ICD-10-CM | POA: Diagnosis not present

## 2023-12-12 LAB — BAYER DCA HB A1C WAIVED: HB A1C (BAYER DCA - WAIVED): 8.2 % — ABNORMAL HIGH (ref 4.8–5.6)

## 2023-12-12 LAB — MICROALBUMIN, URINE WAIVED
Creatinine, Urine Waived: 300 mg/dL (ref 10–300)
Microalb, Ur Waived: 80 mg/L — ABNORMAL HIGH (ref 0–19)

## 2023-12-12 MED ORDER — AMLODIPINE BESYLATE 2.5 MG PO TABS: 2.5000 mg | ORAL_TABLET | Freq: Every day | ORAL | 4 refills | Status: AC

## 2023-12-12 MED ORDER — SERTRALINE HCL 100 MG PO TABS: ORAL_TABLET | ORAL | 4 refills | Status: DC

## 2023-12-12 MED ORDER — BASAGLAR KWIKPEN 100 UNIT/ML ~~LOC~~ SOPN: 16.0000 [IU] | PEN_INJECTOR | Freq: Every day | SUBCUTANEOUS | 4 refills | Status: AC

## 2023-12-12 MED ORDER — CLOPIDOGREL BISULFATE 75 MG PO TABS: 75.0000 mg | ORAL_TABLET | Freq: Every day | ORAL | 4 refills | Status: DC

## 2023-12-12 MED ORDER — LOSARTAN POTASSIUM 100 MG PO TABS: 100.0000 mg | ORAL_TABLET | Freq: Every day | ORAL | 4 refills | Status: DC

## 2023-12-12 MED ORDER — PANTOPRAZOLE SODIUM 40 MG PO TBEC: 40.0000 mg | DELAYED_RELEASE_TABLET | Freq: Every day | ORAL | 3 refills | Status: DC

## 2023-12-12 MED ORDER — ATORVASTATIN CALCIUM 40 MG PO TABS: ORAL_TABLET | ORAL | 4 refills | Status: DC

## 2023-12-12 MED ORDER — BENZONATATE 100 MG PO CAPS: 100.0000 mg | ORAL_CAPSULE | Freq: Three times a day (TID) | ORAL | 4 refills | Status: DC | PRN

## 2023-12-12 MED ORDER — METFORMIN HCL 1000 MG PO TABS: 500.0000 mg | ORAL_TABLET | Freq: Two times a day (BID) | ORAL | 4 refills | Status: AC

## 2023-12-12 MED ORDER — HYDROCHLOROTHIAZIDE 12.5 MG PO CAPS
ORAL_CAPSULE | ORAL | 4 refills | Status: AC
Start: 2023-12-12 — End: ?

## 2023-12-12 NOTE — Assessment & Plan Note (Signed)
Chronic, ongoing.  Continue collaboration with rheumatology and current medication regimen.  Recent notes reviewed. 

## 2023-12-12 NOTE — Assessment & Plan Note (Signed)
Refer to diabetes with hyperglycemia plan of care.

## 2023-12-12 NOTE — Assessment & Plan Note (Signed)
 Chronic, stable Stage 3a.  Continue Losartan  for kidney protection.  Urine ALB 80 January 2025. Continue collaboration with cardiology.  Consider nephrology referral if worsening function.  Labs: CMP, TSH.

## 2023-12-12 NOTE — Assessment & Plan Note (Signed)
Chronic, ongoing.  Denies SI/HI.  Continue Sertraline and Ativan.  Has had multiple attempts at reduction of benzo with no success.  Discussed at length risks with her and she is aware.  Continue Ativan and fill upon request.  PDMP reviewed.  Return in 3 months, UDS due next 06/07/24 and contract on file.

## 2023-12-12 NOTE — Assessment & Plan Note (Signed)
Chronic, ongoing.  Continue current medication regimen and adjust as needed.  Lipid panel today.  Discussed with patient, if myalgias with Atorvastatin, + her history of myalgia with statins, could consider Repatha initiation if poor tolerance.

## 2023-12-12 NOTE — Assessment & Plan Note (Signed)
Chronic, ongoing.  Continue Protonix and adjust as needed.  Mag level today.  Risks of PPI use were discussed with patient including bone loss, C. Diff diarrhea, pneumonia, infections, CKD, electrolyte abnormalities.  Verbalizes understanding and chooses to continue the medication.

## 2023-12-12 NOTE — Progress Notes (Signed)
 BP 129/80 (BP Location: Left Arm, Cuff Size: Normal)   Pulse 62   Temp 98.4 F (36.9 C) (Oral)   Ht 5\' 5"  (1.651 m)   Wt 154 lb 9.6 oz (70.1 kg)   SpO2 97%   BMI 25.73 kg/m    Subjective:    Patient ID: Laura Rojas, female    DOB: 1946-05-07, 78 y.o.   MRN: [id number]  HPI: Laura Rojas is a 78 y.o. female  Chief Complaint  Patient presents with   Chronic Kidney Disease   Depression   Gastroesophageal Reflux   Diabetes    Eye exam requested from [facility]   Hyperlipidemia   Hypertension   DIABETES A1c 7.2% October. Endorses falling off diet during holidays with cakes and pies. , so suspects A1c will have trended up.  Continues on Basaglar , Novolog  sliding scale, and Metformin .  Saw endocrinology in past, last visit with NP [facility] 05/16/21, has not returned as NP no longer at the practice and she prefers to stay with PCP.  Per Freestyle average over past 90 days was 174. In target 56% of the time, above 43%, and low 1%. Continues B12 for history of low levels.  Took Januvia  in past but due to pancreatitis and elevation in Lipase and Amylase we stopped this.  Last GI visit on 10/18/23 (Dr. Corky Diener), abdominal ultrasound performed and stable -- gallstones present but no inflammation.    She did not tolerate GLP1 (Trulicity) in past and had side effects with Farxiga .   Hypoglycemic episodes: 10 lows over past 90 days. Polydipsia/polyuria: no Visual disturbance: no Chest pain: no Paresthesias: no Glucose Monitoring: yes             Accucheck frequency: Freestyle -- 50 to 300 -- did not bring in today, last check her average over 90 days was 170             Fasting glucose:              Post prandial:             Evening:              Before meals: Taking Insulin ?: yes             Long acting insulin : 9 units on average             Short acting insulin : sliding scale units with meals (20 - 22 units) Blood Pressure Monitoring: rarely Retinal Examination: Up to Date  -- [facility] Foot Exam: Up to Date Pneumovax: Up to Date Influenza: Up to Date Aspirin: yes   HYPERTENSION / HYPERLIPIDEMIA/CAD Taking Amlodipine , Plavix , HCTZ, Fish oil, Losartan , Lipitor. Follows with Dr. Alvis Ba with cardiology, last saw 12/04/22, is to return annually due to subclavian artery stenosis and angina.  Denies any recent CP. Satisfied with current treatment? yes Duration of hypertension: chronic BP monitoring frequency: not checking BP range:  BP medication side effects: no Duration of hyperlipidemia: chronic Cholesterol medication side effects: no Cholesterol supplements: fish oil Medication compliance: good compliance Aspirin: yes Recent stressors: no Recurrent headaches: no Visual changes: no Palpitations: no Dyspnea: no Chest pain: no Lower extremity edema: no Dizzy/lightheaded: no   CHRONIC KIDNEY DISEASE (CKD 3a) Remains on Protonix  for heart burn. CKD status: stable Medications renally dose: yes Previous renal evaluation: no Pneumovax:  Up to Date Influenza Vaccine:  Up to Date    POLYMYALGIA RHEUMATICA & RLS Has polymyalgia rheumatica syndrome. Saw rheumatology last 11/06/23,  getting injections at this time, Cimzia .  These have kept pain under control. RLS is stable, no Gabapentin  at this time, she used in past. Feels discomfort in hands more in the cold weather. Duration: chronic Involved joints: right hand, both feet, and left knee Mechanism of injury: arthritis Severity: 0/10 today Quality:  dull, aching and burning Frequency: intermittent  Radiation: no Aggravating factors: walking and movement  Alleviating factors: Aspercreme and APAP  Status: stable Treatments attempted: Tylenol  and Aspercreme Relief with NSAIDs?:  No NSAIDs Taken Weakness with weight bearing or walking: no Sensation of giving way: no  ANXIETY/STRESS Continues to take Zoloft  150 MG daily.  Continues Ativan  0.5 MG, takes this at night only, although ordered BID --  sometimes takes two every night (1 MG) - - started by previous PCP. Pt aware of risks of benzo medication use to include increased sedation, respiratory suppression, falls, dependence and cardiovascular events.  Pt would like to continue treatment as benefit determined to outweigh risk. On review PDMP last Ativan  fill 12/07/23 for 3 months supply, previous PCP ordered this way. Duration:stable Anxious mood: no Excessive worrying: no Irritability: occasional Sweating: no Nausea: no Palpitations:no Hyperventilation: no Panic attacks: no Agoraphobia: no  Obscessions/compulsions: no Depressed mood: no    10/16/2023    3:18 PM 09/11/2023   10:49 AM 06/08/2023   11:15 AM 03/09/2023   11:00 AM 12/08/2022   11:07 AM  Depression screen PHQ 2/9  Decreased Interest 0 0 0 0 0  Down, Depressed, Hopeless 0 0 0 0 0  PHQ - 2 Score 0 0 0 0 0  Altered sleeping 0 1 2 1 1   Tired, decreased energy 1 1 1 1  0  Change in appetite 0 1 1 1  0  Feeling bad or failure about yourself  0 0 0 0 0  Trouble concentrating 0 0 0 0 0  Moving slowly or fidgety/restless 0 0 0 0 0  Suicidal thoughts 0 0 0 0 0  PHQ-9 Score 1 3 4 3 1   Difficult doing work/chores Not difficult at all Not difficult at all Not difficult at all Not difficult at all Not difficult at all      09/11/2023   10:49 AM 06/08/2023   11:15 AM 03/09/2023   11:00 AM 12/08/2022   11:08 AM  GAD 7 : Generalized Anxiety Score  Nervous, Anxious, on Edge 0 0 0 0  Control/stop worrying 0 0 0 0  Worry too much - different things 0 0 0 0  Trouble relaxing 0 0 0 0  Restless 0 0 0 0  Easily annoyed or irritable 1 0 0 0  Afraid - awful might happen 0 0 0 0  Total GAD 7 Score 1 0 0 0  Anxiety Difficulty Not difficult at all Not difficult at all Not difficult at all Not difficult at all      Relevant past medical, surgical, family and social history reviewed and updated as indicated. Interim medical history since our last visit reviewed. Allergies and  medications reviewed and updated.  Review of Systems  Constitutional:  Negative for activity change, appetite change, diaphoresis, fatigue and fever.  Respiratory:  Negative for cough, chest tightness and shortness of breath.   Cardiovascular:  Negative for chest pain, palpitations and leg swelling.  Gastrointestinal: Negative.   Endocrine: Negative for cold intolerance, heat intolerance, polydipsia, polyphagia and polyuria.  Neurological:  Negative for dizziness, syncope, weakness, light-headedness, numbness and headaches.  Psychiatric/Behavioral: Negative.  Per HPI unless specifically indicated above     Objective:    BP 129/80 (BP Location: Left Arm, Cuff Size: Normal)   Pulse 62   Temp 98.4 F (36.9 C) (Oral)   Ht 5\' 5"  (1.651 m)   Wt 154 lb 9.6 oz (70.1 kg)   SpO2 97%   BMI 25.73 kg/m   Wt Readings from Last 3 Encounters:  12/12/23 154 lb 9.6 oz (70.1 kg)  10/16/23 155 lb (70.3 kg)  09/11/23 153 lb (69.4 kg)    Physical Exam Vitals and nursing note reviewed.  Constitutional:      General: She is awake. She is not in acute distress.    Appearance: She is well-developed, well-groomed and overweight. She is not ill-appearing.  HENT:     Head: Normocephalic.     Right Ear: Hearing normal.     Left Ear: Hearing normal.  Eyes:     General: Lids are normal.        Right eye: No discharge.        Left eye: No discharge.     Conjunctiva/sclera: Conjunctivae normal.     Pupils: Pupils are equal, round, and reactive to light.  Neck:     Thyroid : No thyromegaly.     Vascular: Carotid bruit (bilateral) present.  Cardiovascular:     Rate and Rhythm: Normal rate and regular rhythm.     Heart sounds: Normal heart sounds. No murmur heard.    No gallop.  Pulmonary:     Effort: Pulmonary effort is normal. No accessory muscle usage or respiratory distress.     Breath sounds: No wheezing or rhonchi.  Abdominal:     General: Bowel sounds are normal.     Palpations:  Abdomen is soft.  Musculoskeletal:     Cervical back: Normal range of motion and neck supple.     Right lower leg: No edema.     Left lower leg: No edema.  Skin:    General: Skin is warm and dry.  Neurological:     Mental Status: She is alert and oriented to person, place, and time.  Psychiatric:        Attention and Perception: Attention normal.        Mood and Affect: Mood normal.        Speech: Speech normal.        Behavior: Behavior normal. Behavior is cooperative.        Thought Content: Thought content normal.    Results for orders placed or performed in visit on 09/11/23  HgB A1c   Collection Time: 09/11/23 11:18 AM  Result Value Ref Range   Hgb A1c MFr Bld 7.2 (H) 4.8 - 5.6 %   Est. average glucose Bld gHb Est-mCnc 160 mg/dL  Comprehensive metabolic panel   Collection Time: 09/11/23 11:18 AM  Result Value Ref Range   Glucose 152 (H) 70 - 99 mg/dL   BUN 15 8 - 27 mg/dL   Creatinine, Ser 5.40 0.57 - 1.00 mg/dL   eGFR 65 >98 JX/BJY/7.82   BUN/Creatinine Ratio 16 12 - 28   Sodium 142 134 - 144 mmol/L   Potassium 4.0 3.5 - 5.2 mmol/L   Chloride 104 96 - 106 mmol/L   CO2 24 20 - 29 mmol/L   Calcium  9.6 8.7 - 10.3 mg/dL   Total Protein 6.7 6.0 - 8.5 g/dL   Albumin 4.5 3.8 - 4.8 g/dL   Globulin, Total 2.2 1.5 - 4.5 g/dL   Bilirubin  Total 0.4 0.0 - 1.2 mg/dL   Alkaline Phosphatase 150 (H) 44 - 121 IU/L   AST 21 0 - 40 IU/L   ALT 23 0 - 32 IU/L  Lipid Panel w/o Chol/HDL Ratio   Collection Time: 09/11/23 11:18 AM  Result Value Ref Range   Cholesterol, Total 155 100 - 199 mg/dL   Triglycerides 213 0 - 149 mg/dL   HDL 63 >08 mg/dL   VLDL Cholesterol Cal 21 5 - 40 mg/dL   LDL Chol Calc (NIH) 71 0 - 99 mg/dL  Amylase   Collection Time: 09/11/23 11:18 AM  Result Value Ref Range   Amylase 86 31 - 110 U/L  Lipase   Collection Time: 09/11/23 11:18 AM  Result Value Ref Range   Lipase 98 (H) 14 - 85 U/L      Assessment & Plan:   Problem List Items Addressed This  Visit       Cardiovascular and Mediastinum   Coronary artery disease of bypass graft of native heart with stable angina pectoris (HCC)   Chronic, stable.  No recent chest pain.  Continue collaboration with cardiology and preventative treatment, including statin and ASA.      Relevant Medications   sertraline  (ZOLOFT ) 100 MG tablet   losartan  (COZAAR ) 100 MG tablet   hydrochlorothiazide  (MICROZIDE ) 12.5 MG capsule   clopidogrel  (PLAVIX ) 75 MG tablet   atorvastatin  (LIPITOR) 40 MG tablet   amLODipine  (NORVASC ) 2.5 MG tablet   Hypertension associated with diabetes (HCC)   Chronic, stable.  BP at goal for age.  Continue current medication regimen and collaboration with cardiology. Labs: TSH, CMP.  Recommend she monitor BP at least three days a week at home and document for providers + bring to visits.  DASH diet focus.  Return in 3 months.      Relevant Medications   metFORMIN  (GLUCOPHAGE ) 1000 MG tablet   losartan  (COZAAR ) 100 MG tablet   Insulin  Glargine (BASAGLAR  KWIKPEN) 100 UNIT/ML   hydrochlorothiazide  (MICROZIDE ) 12.5 MG capsule   atorvastatin  (LIPITOR) 40 MG tablet   amLODipine  (NORVASC ) 2.5 MG tablet   Other Relevant Orders   Bayer DCA Hb A1c Waived   Microalbumin, Urine Waived   Comprehensive metabolic panel   TSH   Subclavian artery stenosis, left (HCC)   Stable, continue collaboration with cardiology and preventative treatment to include ASA, Plavix , and statin.      Relevant Medications   losartan  (COZAAR ) 100 MG tablet   hydrochlorothiazide  (MICROZIDE ) 12.5 MG capsule   atorvastatin  (LIPITOR) 40 MG tablet   amLODipine  (NORVASC ) 2.5 MG tablet     Digestive   Cirrhosis, non-alcoholic (HCC)   Ongoing, will continue collaboration with GI at this time.  Remain off Januvia  and GLP1.  Recent GI notes reviewed,       Gastroesophageal reflux disease without esophagitis   Chronic, ongoing.  Continue Protonix  and adjust as needed.  Mag level today.  Risks of PPI use were  discussed with patient including bone loss, C. Diff diarrhea, pneumonia, infections, CKD, electrolyte abnormalities.  Verbalizes understanding and chooses to continue the medication.      Relevant Medications   pantoprazole  (PROTONIX ) 40 MG tablet   Other Relevant Orders   Magnesium     Endocrine   CKD stage 3 due to type 2 diabetes mellitus (HCC)   Chronic, stable Stage 3a.  Continue Losartan  for kidney protection.  Urine ALB 80 January 2025. Continue collaboration with cardiology.  Consider nephrology referral if worsening function.  Labs: CMP, TSH.      Relevant Medications   metFORMIN  (GLUCOPHAGE ) 1000 MG tablet   losartan  (COZAAR ) 100 MG tablet   Insulin  Glargine (BASAGLAR  KWIKPEN) 100 UNIT/ML   atorvastatin  (LIPITOR) 40 MG tablet   Other Relevant Orders   Bayer DCA Hb A1c Waived   Microalbumin, Urine Waived   Comprehensive metabolic panel   Hyperlipidemia associated with type 2 diabetes mellitus (HCC)   Chronic, ongoing.  Continue current medication regimen and adjust as needed.  Lipid panel today.  Discussed with patient, if myalgias with Atorvastatin , + her history of myalgia with statins, could consider Repatha initiation if poor tolerance.      Relevant Medications   metFORMIN  (GLUCOPHAGE ) 1000 MG tablet   losartan  (COZAAR ) 100 MG tablet   Insulin  Glargine (BASAGLAR  KWIKPEN) 100 UNIT/ML   hydrochlorothiazide  (MICROZIDE ) 12.5 MG capsule   atorvastatin  (LIPITOR) 40 MG tablet   amLODipine  (NORVASC ) 2.5 MG tablet   Other Relevant Orders   Bayer DCA Hb A1c Waived   Comprehensive metabolic panel   Lipid Panel w/o Chol/HDL Ratio   Type 2 diabetes mellitus with hyperglycemia, with long-term current use of insulin  (HCC) - Primary   Chronic, ongoing, followed by endocrinology in past.  A1c 8.2% today, trend up from 7.2% and above goal for age per geriatric society due to dietary indiscretions over holidays. Her previous levels in 9-10 range on average, including with  endocrinology.  She wishes to avoid return to endo at this time as her previous provider left.  Will continue Basaglar  and recommend she increase to 10 units and adjust further as needed based on A1c result + continue sliding scale and Metformin .  Continue collaboration with [facility] as needed.   Bring Libre to all visits.  Return to office in 3 months -- goal to avoid hypoglycemia.  If consistent poor control will get back into endo.  She will focus on returning to previous diet monitoring now that holidays are done. - Eye and foot exam up to date - Statin on board.  ARB on board. - Vaccinations up to date.      Relevant Medications   metFORMIN  (GLUCOPHAGE ) 1000 MG tablet   losartan  (COZAAR ) 100 MG tablet   Insulin  Glargine (BASAGLAR  KWIKPEN) 100 UNIT/ML   atorvastatin  (LIPITOR) 40 MG tablet   Other Relevant Orders   Bayer DCA Hb A1c Waived   Comprehensive metabolic panel     Other   Anxiety disorder   Chronic, ongoing.  Denies SI/HI.  Continue Sertraline  and Ativan .  Has had multiple attempts at reduction of benzo with no success.  Discussed at length risks with her and she is aware.  Continue Ativan  and fill upon request.  PDMP reviewed.  Return in 3 months, UDS due next 06/07/24 and contract on file.      Relevant Medications   sertraline  (ZOLOFT ) 100 MG tablet   B12 deficiency   Ongoing, continue supplement and recheck level every 6 months.      Relevant Orders   Vitamin B12   Depression, recurrent (HCC)   Chronic, ongoing.  Denies SI/HI.  Continue Sertraline  150 MG daily as is ordered. This is offering benefit to mood overall and may help reduce benzo use.  Consider increase to 200 MG in future for increased benefit, she does not want to change at this time.  Return in 3 months.      Relevant Medications   sertraline  (ZOLOFT ) 100 MG tablet   Insomnia   Chronic, ongoing with  use of Lorazepam  nightly.  Has had trial reductions in past and other medications without success.   Is fully aware of risks of chronic benzo use and aware this will be discussed each visit.  Return in 3 months.  UDS due next 06/07/24, contract up to date.      Insulin  long-term use (HCC)   Refer to diabetes with hyperglycemia plan of care.      Polymyalgia rheumatica (HCC)   Chronic, ongoing.  Continue collaboration with rheumatology and current medication regimen.  Recent notes reviewed.        Follow up plan: Return in about 3 months (around 03/11/2024) for T2DM, HTN/HLD, RA, MOOD.

## 2023-12-12 NOTE — Assessment & Plan Note (Signed)
Ongoing, will continue collaboration with GI at this time.  Remain off Januvia and GLP1.  Recent GI notes reviewed,

## 2023-12-12 NOTE — Assessment & Plan Note (Signed)
 Chronic, ongoing, followed by endocrinology in past.  A1c 8.2% today, trend up from 7.2% and above goal for age per geriatric society due to dietary indiscretions over holidays. Her previous levels in 9-10 range on average, including with endocrinology.  She wishes to avoid return to endo at this time as her previous provider left.  Will continue Basaglar  and recommend she increase to 10 units and adjust further as needed based on A1c result + continue sliding scale and Metformin .  Continue collaboration with [facility] as needed.   Bring Libre to all visits.  Return to office in 3 months -- goal to avoid hypoglycemia.  If consistent poor control will get back into endo.  She will focus on returning to previous diet monitoring now that holidays are done. - Eye and foot exam up to date - Statin on board.  ARB on board. - Vaccinations up to date.

## 2023-12-12 NOTE — Telephone Encounter (Signed)
 Reordered 12/12/23 #[id number] 4 RF to requesting pharmacy  Requested Prescriptions  Refused Prescriptions Disp Refills   amLODipine  (NORVASC ) 2.5 MG tablet [Pharmacy Med Name: AMLODIPINE  BESYLATE 2.5 MG TAB] 90 tablet 4    Sig: TAKE 1 TABLET BY MOUTH EVERY DAY     Cardiovascular: Calcium  Channel Blockers 2 Passed - 12/12/2023  3:08 PM      Passed - Last BP in normal range    BP Readings from Last 1 Encounters:  12/12/23 129/80         Passed - Last Heart Rate in normal range    Pulse Readings from Last 1 Encounters:  12/12/23 62         Passed - Valid encounter within last 6 months    Recent Outpatient Visits           Today Type 2 diabetes mellitus with hyperglycemia, with long-term current use of insulin  (HCC)   [facility] [place], [place] T, NP   3 months ago Type 2 diabetes mellitus with hyperglycemia, with long-term current use of insulin  (HCC)   [place] [facility] [place], [place] T, NP   6 months ago Type 2 diabetes mellitus with hyperglycemia, with long-term current use of insulin  (HCC)   [place] [facility] [place], [place] T, NP   9 months ago Type 2 diabetes mellitus with hyperglycemia, with long-term current use of insulin  (HCC)   [place] [facility] [place], [place] T, NP   1 year ago Type 2 diabetes mellitus with hyperglycemia, with long-term current use of insulin  [facility])   [facility] [facility] Lemar Pyles, NP       Future Appointments             In 1 month Elta Halter, MD [facility]   In 1 month [place], [place], MD [facility]   In 3 months [place], Lavelle Posey, NP [place] [facility]   In 8 months Elta Halter, MD [facility]

## 2023-12-12 NOTE — Assessment & Plan Note (Signed)
Chronic, stable.  No recent chest pain.  Continue collaboration with cardiology and preventative treatment, including statin and ASA.

## 2023-12-12 NOTE — Assessment & Plan Note (Signed)
Chronic, ongoing with use of Lorazepam nightly.  Has had trial reductions in past and other medications without success.  Is fully aware of risks of chronic benzo use and aware this will be discussed each visit.  Return in 3 months.  UDS due next 06/07/24, contract up to date.

## 2023-12-12 NOTE — Assessment & Plan Note (Signed)
Stable, continue collaboration with cardiology and preventative treatment to include ASA, Plavix, and statin. 

## 2023-12-12 NOTE — Assessment & Plan Note (Signed)
 Chronic, stable.  BP at goal for age.  Continue current medication regimen and collaboration with cardiology. Labs: TSH, CMP.  Recommend she monitor BP at least three days a week at home and document for providers + bring to visits.  DASH diet focus.  Return in 3 months.

## 2023-12-12 NOTE — Assessment & Plan Note (Addendum)
 Chronic, ongoing.  Denies SI/HI.  Continue Sertraline  150 MG daily as is ordered. This is offering benefit to mood overall and may help reduce benzo use.  Consider increase to 200 MG in future for increased benefit, she does not want to change at this time.  Return in 3 months.

## 2023-12-12 NOTE — Assessment & Plan Note (Signed)
Ongoing, continue supplement and recheck level every 6 months. 

## 2023-12-13 LAB — COMPREHENSIVE METABOLIC PANEL
ALT: 24 [IU]/L (ref 0–32)
AST: 20 [IU]/L (ref 0–40)
Albumin: 4.3 g/dL (ref 3.8–4.8)
Alkaline Phosphatase: 156 [IU]/L — ABNORMAL HIGH (ref 44–121)
BUN/Creatinine Ratio: 21 (ref 12–28)
BUN: 19 mg/dL (ref 8–27)
Bilirubin Total: 0.4 mg/dL (ref 0.0–1.2)
CO2: 24 mmol/L (ref 20–29)
Calcium: 9.7 mg/dL (ref 8.7–10.3)
Chloride: 103 mmol/L (ref 96–106)
Creatinine, Ser: 0.89 mg/dL (ref 0.57–1.00)
Globulin, Total: 1.9 g/dL (ref 1.5–4.5)
Glucose: 216 mg/dL — ABNORMAL HIGH (ref 70–99)
Potassium: 3.9 mmol/L (ref 3.5–5.2)
Sodium: 140 mmol/L (ref 134–144)
Total Protein: 6.2 g/dL (ref 6.0–8.5)
eGFR: 66 mL/min/{1.73_m2} (ref >59)

## 2023-12-13 LAB — LIPID PANEL W/O CHOL/HDL RATIO
Cholesterol, Total: 156 mg/dL (ref 100–199)
HDL: 64 mg/dL (ref >39)
LDL Chol Calc (NIH): 65 mg/dL (ref 0–99)
Triglycerides: 164 mg/dL — ABNORMAL HIGH (ref 0–149)
VLDL Cholesterol Cal: 27 mg/dL (ref 5–40)

## 2023-12-13 LAB — VITAMIN B12: Vitamin B-12: >2000 pg/mL — ABNORMAL HIGH (ref 232–1245)

## 2023-12-13 LAB — TSH: TSH: 2.85 u[IU]/mL (ref 0.450–4.500)

## 2023-12-13 LAB — MAGNESIUM: Magnesium: 1.7 mg/dL (ref 1.6–2.3)

## 2023-12-13 NOTE — Progress Notes (Signed)
Contacted via MyChart   Good morning Laura Rojas, your labs have returned: - Kidney function, creatinine and eGFR, remains normal, as is liver function, AST and ALT.  - B12 is well above goal, you could reduce supplement to every other day.  Thyroid, TSH, is normal. - Lipid panel shows LDL at goal.  Triglycerides a little elevated, as we discussed get back to healthier diet choices.  Any questions? Keep being stellar!!  Thank you for allowing me to participate in your care.  I appreciate you. Kindest regards, Demarkus Remmel

## 2023-12-14 ENCOUNTER — Encounter: Payer: Self-pay | Admitting: Nurse Practitioner

## 2023-12-25 ENCOUNTER — Ambulatory Visit
Admission: RE | Admit: 2023-12-25 | Discharge: 2023-12-25 | Disposition: A | Discharge status: Home / Self Care | Payer: Medicare Other | Source: Ambulatory Visit | Attending: Nurse Practitioner | Admitting: Nurse Practitioner

## 2023-12-25 ENCOUNTER — Encounter: Payer: Self-pay | Admitting: Nurse Practitioner

## 2023-12-25 DIAGNOSIS — Z780 Asymptomatic menopausal state: Secondary | ICD-10-CM | POA: Diagnosis not present

## 2023-12-25 DIAGNOSIS — Z1231 Encounter for screening mammogram for malignant neoplasm of breast: Secondary | ICD-10-CM | POA: Insufficient documentation

## 2023-12-25 DIAGNOSIS — M810 Age-related osteoporosis without current pathological fracture: Secondary | ICD-10-CM | POA: Diagnosis not present

## 2023-12-25 MED ORDER — ALENDRONATE SODIUM 70 MG PO TABS: 70.0000 mg | ORAL_TABLET | ORAL | 11 refills | Status: DC

## 2023-12-25 NOTE — Progress Notes (Signed)
Contacted via MyChart   Good afternoon Laura Rojas, your imaging has returned and your bone density has declined since 2015.  Previously you had normal findings, but now are showing osteoporosis, which means bones are a bit more brittle and can fracture easily.  The main initial treatment for this is a bisphosphonate like Fosamax orally once a week.  These can cause side effects such as increased heart burn, low calcium levels, and at times issue with the jaw.  However, they can also improve bone density.  We could consider trying this and if poor tolerance could try alternate medication.  Let me know your thoughts on this.  Any questions?

## 2023-12-26 ENCOUNTER — Encounter: Payer: Self-pay | Admitting: Nurse Practitioner

## 2023-12-26 NOTE — Progress Notes (Signed)
Contacted via MyChart   Normal mammogram, may repeat in one year:)

## 2023-12-31 DIAGNOSIS — M0609 Rheumatoid arthritis without rheumatoid factor, multiple sites: Secondary | ICD-10-CM | POA: Diagnosis not present

## 2024-01-13 ENCOUNTER — Other Ambulatory Visit: Payer: Self-pay | Admitting: Dermatology

## 2024-01-13 DIAGNOSIS — L2089 Other atopic dermatitis: Secondary | ICD-10-CM

## 2024-01-16 ENCOUNTER — Ambulatory Visit: Payer: Medicare Other | Admitting: Dermatology

## 2024-01-23 ENCOUNTER — Ambulatory Visit: Payer: Medicare Other | Attending: Cardiovascular Disease | Admitting: Cardiovascular Disease

## 2024-01-23 ENCOUNTER — Encounter: Payer: Self-pay | Admitting: Cardiovascular Disease

## 2024-01-23 VITALS — BP 118/64 | HR 70 | Ht 65.0 in | Wt 156.0 lb

## 2024-01-23 DIAGNOSIS — I739 Peripheral vascular disease, unspecified: Secondary | ICD-10-CM | POA: Diagnosis not present

## 2024-01-23 DIAGNOSIS — E1151 Type 2 diabetes mellitus with diabetic peripheral angiopathy without gangrene: Secondary | ICD-10-CM

## 2024-01-23 DIAGNOSIS — E7800 Pure hypercholesterolemia, unspecified: Secondary | ICD-10-CM

## 2024-01-23 DIAGNOSIS — T50905D Adverse effect of unspecified drugs, medicaments and biological substances, subsequent encounter: Secondary | ICD-10-CM | POA: Diagnosis not present

## 2024-01-23 DIAGNOSIS — I10 Essential (primary) hypertension: Secondary | ICD-10-CM

## 2024-01-23 DIAGNOSIS — I25118 Atherosclerotic heart disease of native coronary artery with other forms of angina pectoris: Secondary | ICD-10-CM | POA: Diagnosis not present

## 2024-01-23 DIAGNOSIS — R001 Bradycardia, unspecified: Secondary | ICD-10-CM

## 2024-01-23 NOTE — Progress Notes (Signed)
 patient ID: Laura Rojas, female   DOB: [birth year], 78 y.o.   MRN: [id number]     Cardiology Office Note    Date:  01/29/2024   ID:  Laura, Rojas 11-29-1945, MRN [id number]  PCP:  Marjie Skiff, NP  Cardiologist:   Thurmon Fair, MD   Chief complaint: CAD follow up  History of Present Illness:  Laura Rojas is a 78 y.o. female with coronary disease, PAD (left subclavian stenosis), DM, HTN and hyperlipidemia, polymyalgia rheumatica.  She has very little over the cardiovascular complaints except that she feels "very tired in the middle of the day".  She often has to take a 2-hour nap.  She has not had dizziness or syncope.  She denies palpitations, chest pain or shortness of breath at rest or with her usual activity.  She has not had orthopnea, PND, lower extremity edema or claudication.    She has a good metabolic profile with an LDL of 65 and HDL of 64 on statin therapy, but hemoglobin A1c is elevated at 8.2%.  She has normal renal function.    She tried to take Jardiance but this caused severe bilateral flank pain that resolved when she stopped the medication.  Her lipase was markedly elevated at 450, consistent with pancreatitis.  After stopping the medication the lipase decreased to 109.  In the past she took Trulicity but I remember her telling me that "it was not helping".  She is no longer on that medication although she does not remember whether it was stopped due to side effects or ineffectiveness.  She is currently on insulin and a low-dose of metformin   She had stents placed in the proximal LAD and proximal RCA (2007, 3.0 x 28 mm drug-eluting Cypher stent to RCA, 3.5 x 33 mm drug-eluting Cypher stent and proximal LAD). She is known to have an untreated stenosis in the very distal portion of the LAD and a 60% "jailed" ostial diagonal stenosis at the level of the proximal LAD stent. She has preserved LV systolic function and has never had congestive heart failure. Her most  recent functional study was a nuclear stress test in 2013 that showed normal perfusion and normal LVEF. Her most recent cardiac catheterization was performed in 2009 and both the LAD and RCA stents were found to be widely patent. Significant comorbidities include diabetes mellitus type 2, hyperlipidemia and hypertension.    Past Medical History:  Diagnosis Date   Arthritis    Shoulders, hands, feet   CAD (coronary artery disease) 12/31/2013   2007, 3.0 x 28 mm drug-eluting Cypher stent to RCA, 3.5 x 33 mm drug-eluting Cypher stent and proximal LAD) Consent by 2009 Normal perfusion by mildly 2013    Diabetes mellitus without complication (HCC)    Dyslipidemia    GERD (gastroesophageal reflux disease)    Hypertension    Polymyalgia rheumatica syndrome (HCC)    Subclavian artery stenosis, left (HCC) 01/01/2015    Past Surgical History:  Procedure Laterality Date   BREAST EXCISIONAL BIOPSY Right 1970   EXCISIONAL - NEG   BREAST EXCISIONAL BIOPSY Left 1973   EXCISIONAL - NEG   BREAST SURGERY     CARDIAC CATHETERIZATION  08/25/2008   patent stents   CAROTID DUPLEX  10/2006   NORMAL CAROTID ARTERY DUPLEX   CATARACT EXTRACTION W/PHACO Left 04/13/2020   Procedure: CATARACT EXTRACTION PHACO AND INTRAOCULAR LENS PLACEMENT (IOC) LEFT DIABETIC 4.69  00:44.0;  Surgeon: Galen Manila, MD;  [facility];  Service: Ophthalmology;  Laterality: Left;  Diabetic - insulin and oral meds   CATARACT EXTRACTION W/PHACO Right 05/04/2020   Procedure: CATARACT EXTRACTION PHACO AND INTRAOCULAR LENS PLACEMENT (IOC) RIGHT DIABETIC 3.92  00:27.1;  Surgeon: Galen Manila, MD;  Location: [facility];  Service: Ophthalmology;  Laterality: Right;  Diabetic - insulin and oral meds   COLONOSCOPY WITH PROPOFOL N/A 08/19/2019   Procedure: COLONOSCOPY WITH PROPOFOL;  Surgeon: Midge Minium, MD;  [facility];  Service: Endoscopy;  Laterality: N/A;   CORONARY ANGIOPLASTY WITH STENT  PLACEMENT  09/17/2006   PTCA & stenting LAD   ESOPHAGOGASTRODUODENOSCOPY N/A 04/27/2015   Procedure: ESOPHAGOGASTRODUODENOSCOPY (EGD);  Surgeon: Midge Minium, MD;  Location[facility];  Service: Endoscopy;  Laterality: N/A;   JOINT REPLACEMENT     NM MYOCAR MULTIPLE W/SPECT  11/2011   EF 72%.NORMAL MYOCARDIAL PERFUSION STUDY   REPLACEMENT TOTAL KNEE  2003 & 2004   TRANSTHORACIC ECHOCARDIOGRAM  09/2006   EF =>55%. IMPAIRED LV RELAXATION. MILD MITRAL ANNULAR CALCIFICATION. AV-MILDLY SCLEROTIC. NO AS.   VAGINAL HYSTERECTOMY  1983    Current Medications: Outpatient Medications Prior to Visit  Medication Sig Dispense Refill   acetaminophen (TYLENOL) 500 MG tablet Take 500 mg by mouth 2 (two) times daily. For arthritis pain     amLODipine (NORVASC) 2.5 MG tablet Take 1 tablet (2.5 mg total) by mouth daily. 90 tablet 4   aspirin 81 MG tablet Take 81 mg by mouth daily.     atorvastatin (LIPITOR) 40 MG tablet TAKE 1 TABLET BY MOUTH EVERY DAY** STOP THE SIMVASTATIN 90 tablet 4   certolizumab pegol (CIMZIA) 2 X 200 MG/ML PSKT Inject into the skin.     cholecalciferol (VITAMIN D3) 25 MCG (1000 UT) tablet Take 1,000 Units by mouth daily.     clopidogrel (PLAVIX) 75 MG tablet Take 1 tablet (75 mg total) by mouth daily. 90 tablet 4   Continuous Blood Gluc Sensor (FREESTYLE LIBRE 14 DAY SENSOR) MISC USE 1 KIT EVERY 14 (FOURTEEN) DAYS 2 each 5   hydrochlorothiazide (MICROZIDE) 12.5 MG capsule TAKE 1 CAPSULE BY MOUTH EVERY DAY 90 capsule 4   Insulin Aspart FlexPen (NOVOLOG) 100 UNIT/ML INJECT 20-25 UNITS INTO THE SKIN 3 (THREE) TIMES DAILY. INJECT PER SLIDING SCALE REGIMEN. (Patient taking differently: 20 Units. INJECT 20-25 UNITS INTO THE SKIN 3 (THREE) TIMES DAILY. INJECT PER SLIDING SCALE REGIMEN.) 45 mL 8   Insulin Glargine (BASAGLAR KWIKPEN) 100 UNIT/ML Inject 16 Units into the skin at bedtime. 15 mL 4   Insulin Pen Needle (BD PEN NEEDLE NANO 2ND GEN) 32G X 4 MM MISC USE 1 EACH 4 (FOUR) TIMES DAILY  400 each 0   LORazepam (ATIVAN) 0.5 MG tablet TAKE 1 TAB BY MOUTH TWICE A DAY (USE THE ADDITIONAL 30 TABS ONLY SPARINGLY & ONLY FOR SEVERE ANXIETY) 180 tablet 0   losartan (COZAAR) 100 MG tablet Take 1 tablet (100 mg total) by mouth daily. 90 tablet 4   Magnesium 400 MG TABS Take 1 tablet by mouth daily.     metFORMIN (GLUCOPHAGE) 1000 MG tablet Take 0.5 tablets (500 mg total) by mouth 2 (two) times daily. 90 tablet 4   Multiple Vitamin (MULTIVITAMIN) tablet Take 1 tablet by mouth daily.     Omega-3 Fatty Acids (FISH OIL) 1200 MG CAPS Take 1,200 mg by mouth daily.     pantoprazole (PROTONIX) 40 MG tablet Take 1 tablet (40 mg total) by mouth daily. 90 tablet 3   sertraline (ZOLOFT)  100 MG tablet TAKE 1.5 TABLETS BY MOUTH DAILY 135 tablet 4   vitamin B-12 (CYANOCOBALAMIN) 1000 MCG tablet Take 1,000 mcg by mouth daily.     VITRON-C 65-125 MG TABS TAKE 1 TABLET BY MOUTH EVERY DAY 60 tablet 7   alendronate (FOSAMAX) 70 MG tablet Take 1 tablet (70 mg total) by mouth every 7 (seven) days. Take with a full glass of water on an empty stomach. (Patient not taking: Reported on 01/23/2024) 4 tablet 11   benzonatate (TESSALON PERLES) 100 MG capsule Take 1 capsule (100 mg total) by mouth 3 (three) times daily as needed. (Patient not taking: Reported on 01/23/2024) 60 capsule 4   clobetasol cream (TEMOVATE) 0.05 % SPOT TREAT AFFECTED AREAS TWICE A DAY UNTIL IMPROVED. AVOID FACE, GROIN, AXILLA. (Patient not taking: Reported on 01/23/2024) 30 g 0   mometasone (ELOCON) 0.1 % cream APPLY 1 APPLICATION TOPICALLY DAILY AS NEEDED (RASH). (Patient not taking: Reported on 01/23/2024) 45 g 0   valACYclovir (VALTREX) 1000 MG tablet TAKE 1 TABLET (1,000 MG TOTAL) BY MOUTH DAILY FOR 7 DAYS. (Patient not taking: Reported on 01/23/2024) 7 tablet 6   No facility-administered medications prior to visit.     Allergies:   Compazine [prochlorperazine edisylate], Altace [ramipril], Crestor [rosuvastatin calcium], Reglan  [metoclopramide], Sulfa antibiotics, Actos [pioglitazone], Amlodipine, Latex, and Lipitor [atorvastatin]  PHYSICAL EXAM:   VS:  BP 118/64 (BP Location: Left Arm, Patient Position: Sitting)   Pulse 70   Ht 5\' 5"  (1.651 m)   Wt 156 lb (70.8 kg)   SpO2 94%   BMI 25.96 kg/m       General: Alert, oriented x3, no distress, minimally overweight Head: no evidence of trauma, PERRL, EOMI, no exophtalmos or lid lag, no myxedema, no xanthelasma; normal ears, nose and oropharynx Neck: normal jugular venous pulsations and no hepatojugular reflux; brisk carotid pulses without delay and no carotid bruits Chest: clear to auscultation, no signs of consolidation by percussion or palpation, normal fremitus, symmetrical and full respiratory excursions Cardiovascular: normal position and quality of the apical impulse, regular rhythm, normal first and second heart sounds, no murmurs, rubs or gallops Abdomen: no tenderness or distention, no masses by palpation, no abnormal pulsatility or arterial bruits, normal bowel sounds, no hepatosplenomegaly Extremities: no clubbing, cyanosis or edema; 2+ radial, ulnar and brachial pulses bilaterally; 2+ right femoral, posterior tibial and dorsalis pedis pulses; 2+ left femoral, posterior tibial and dorsalis pedis pulses; no subclavian or femoral bruits Neurological: grossly nonfocal Psych: Normal mood and affect Minimally overweight    Wt Readings from Last 3 Encounters:  01/23/24 156 lb (70.8 kg)  12/12/23 154 lb 9.6 oz (70.1 kg)  10/16/23 155 lb (70.3 kg)      Studies/Labs Reviewed:   EKG:    EKG Interpretation Date/Time:  Wednesday January 23 2024 15:18:06 EST Ventricular Rate:  70 PR Interval:  152 QRS Duration:  76 QT Interval:  372 QTC Calculation: 401 R Axis:   -22  Text Interpretation: Normal sinus rhythm Low voltage QRS T wave abnormality, consider inferior ischemia When compared with ECG of 18-Sep-2006 06:37, No significant change was found  Confirmed by Willett Lefeber ([id number]) on 01/23/2024 3:29:32 PM       Previous ECG showed normal sinus rhythm with slightly reduced voltage throughout and delayed R wave progression with a transition zone in leads V4 through V4 T wave inversion V1-V3, QTc 433 ms, not really changed from previous tracings.  Recent Labs: 12/12/2023: ALT 24; BUN 19; Creatinine, Ser  0.89; Magnesium 1.7; Potassium 3.9; Sodium 140; TSH 2.850   Lipid Panel    Component Value Date/Time   CHOL 156 12/12/2023 1059   CHOL 170 05/08/2019 0939   TRIG 164 (H) 12/12/2023 1059   TRIG 367 (H) 05/08/2019 0939   HDL 64 12/12/2023 1059   CHOLHDL 2.9 08/28/2018 1054   VLDL 73 (H) 05/08/2019 0939   LDLCALC 65 12/12/2023 1059    ASSESSMENT:    1. Essential hypertension   2. Coronary artery disease of native artery of native heart with stable angina pectoris (HCC)      PLAN:  In order of problems listed above:  CAD: Asymptomatic with amlodipine as her only antianginal agent.  She did not tolerate beta-blockers due to fatigue..  On dual antiplatelet with aspirin and clopidogrel and statin.  HTN: Uncontrolled.  I wonder if her mid today fatigue is due to her medications kidney and at the same time.  Move the losartan dose to the evening. SSS: She has had some evidence of bradycardia in the past, but today her heart rate is 70.  Seems to do well as long as we avoid medication with negative chronotropic effect.  Had symptoms consistent with chronotropic incompetence that resolved after he stopped the beta-blocker.   DM: Glycemic control is not ideal.  Unfortunately she has not done well with either SGLT2 inhibitors or GLP-1 agonists and remains on insulin. HLP: Excellent lipid profile.  Continue the same lipid-lowering medications. PAD: Asymptomatic.  Has a slightly lower blood pressure in the left upper extremity suggesting left subclavian stenosis, although this was not seen on previous duplex ultrasonography in 2016.  She  does not have symptoms of claudication or vertebral steal syndrome and had antegrade flow in both vertebral arteries.     Medication Adjustments/Labs and Tests Ordered: Current medicines are reviewed at length with the patient today.  Concerns regarding medicines are outlined above.  Medication changes, Labs and Tests ordered today are listed in the Patient Instructions below. Patient Instructions  Medication Instructions:  - Take LOSARTAN in evening    *If you need a refill on your cardiac medications before your next appointment, please call your pharmacy*   Lab Work: None    If you have labs (blood work) drawn today and your tests are completely normal, you will receive your results only by: MyChart Message (if you have MyChart) OR A paper copy in the mail If you have any lab test that is abnormal or we need to change your treatment, we will call you to review the results.   Testing/Procedures: None    Follow-Up: At [facility], you and your health needs are our priority.  As part of our continuing mission to provide you with exceptional heart care, we have created designated Provider Care Teams.  These Care Teams include your primary Cardiologist (physician) and Advanced Practice Providers (APPs -  Physician Assistants and Nurse Practitioners) who all work together to provide you with the care you need, when you need it.  We recommend signing up for the patient portal called "MyChart".  Sign up information is provided on this After Visit Summary.  MyChart is used to connect with patients for Virtual Visits (Telemedicine).  Patients are able to view lab/test results, encounter notes, upcoming appointments, etc.  Non-urgent messages can be sent to your provider as well.   To learn more about what you can do with MyChart, go to [email or web address].    Your next appointment:   1 year(s)  The format for your next appointment:   In Person  Provider:   Thurmon Fair, MD     Other Instructions     Signed, Thurmon Fair, MD  01/29/2024 6:20 PM    [facility] [address] [place], [place], [place]  [address] Phone: [phone]; Fax: [phone]

## 2024-01-23 NOTE — Patient Instructions (Signed)
 Medication Instructions:  - Take LOSARTAN in evening    *If you need a refill on your cardiac medications before your next appointment, please call your pharmacy*   Lab Work: None    If you have labs (blood work) drawn today and your tests are completely normal, you will receive your results only by: MyChart Message (if you have MyChart) OR A paper copy in the mail If you have any lab test that is abnormal or we need to change your treatment, we will call you to review the results.   Testing/Procedures: None    Follow-Up: At [facility], you and your health needs are our priority.  As part of our continuing mission to provide you with exceptional heart care, we have created designated Provider Care Teams.  These Care Teams include your primary Cardiologist (physician) and Advanced Practice Providers (APPs -  Physician Assistants and Nurse Practitioners) who all work together to provide you with the care you need, when you need it.  We recommend signing up for the patient portal called "MyChart".  Sign up information is provided on this After Visit Summary.  MyChart is used to connect with patients for Virtual Visits (Telemedicine).  Patients are able to view lab/test results, encounter notes, upcoming appointments, etc.  Non-urgent messages can be sent to your provider as well.   To learn more about what you can do with MyChart, go to [email or web address].    Your next appointment:   1 year(s)  The format for your next appointment:   In Person  Provider:   Thurmon Fair, MD    Other Instructions

## 2024-01-29 DIAGNOSIS — M0609 Rheumatoid arthritis without rheumatoid factor, multiple sites: Secondary | ICD-10-CM | POA: Diagnosis not present

## 2024-02-12 ENCOUNTER — Ambulatory Visit (INDEPENDENT_AMBULATORY_CARE_PROVIDER_SITE_OTHER): Payer: Medicare Other | Admitting: Dermatology

## 2024-02-12 ENCOUNTER — Encounter: Payer: Self-pay | Admitting: Dermatology

## 2024-02-12 DIAGNOSIS — W908XXA Exposure to other nonionizing radiation, initial encounter: Secondary | ICD-10-CM

## 2024-02-12 DIAGNOSIS — L281 Prurigo nodularis: Secondary | ICD-10-CM

## 2024-02-12 DIAGNOSIS — L578 Other skin changes due to chronic exposure to nonionizing radiation: Secondary | ICD-10-CM

## 2024-02-12 DIAGNOSIS — L209 Atopic dermatitis, unspecified: Secondary | ICD-10-CM | POA: Diagnosis not present

## 2024-02-12 DIAGNOSIS — L820 Inflamed seborrheic keratosis: Secondary | ICD-10-CM

## 2024-02-12 DIAGNOSIS — L821 Other seborrheic keratosis: Secondary | ICD-10-CM | POA: Diagnosis not present

## 2024-02-12 DIAGNOSIS — L2081 Atopic neurodermatitis: Secondary | ICD-10-CM

## 2024-02-12 DIAGNOSIS — Z79899 Other long term (current) drug therapy: Secondary | ICD-10-CM

## 2024-02-12 DIAGNOSIS — Z7189 Other specified counseling: Secondary | ICD-10-CM

## 2024-02-12 NOTE — Progress Notes (Signed)
   Follow-Up Visit   Subjective  Laura Rojas is a 78 y.o. female who presents for the following: Atopic Dermatitis with Prurigo Nodularis, legs, 43m f/u SM anit itch cream prn The patient has spots, moles and lesions to be evaluated, some may be new or changing and the patient may have concern these could be cancer.  The following portions of the chart were reviewed this encounter and updated as appropriate: medications, allergies, medical history  Review of Systems:  No other skin or systemic complaints except as noted in HPI or Assessment and Plan.  Objective  Well appearing patient in no apparent distress; mood and affect are within normal limits.  A focused examination was performed of the following areas:legs,face,back,arms   Relevant exam findings are noted in the Assessment and Plan.  left cheek/zygoma Stuck-on, waxy, tan-brown papules and plaques -- Discussed benign etiology and prognosis.   Assessment & Plan   ATOPIC DERMATITIS with PRURIGO NODULARIS Bil legs Exam: crust on the left tricep  1% BSA Chronic condition with duration or expected duration over one year. Currently well-controlled.   Atopic dermatitis (eczema) is a chronic, relapsing, pruritic condition that can significantly affect quality of life. It is often associated with allergic rhinitis and/or asthma and can require treatment with topical medications, phototherapy, or in severe cases biologic injectable medication (Dupixent; Adbry) or Oral JAK inhibitors.  Treatment Plan: Cont  [employer] compounded prescription anti-itch cream with  Amitriptyline 5% / Gabapentin 10% / Lidocaine 5% Cream. Apply twice daily to affected areas as needed for itching   Recommend gentle skin care.   SEBORRHEIC KERATOSIS - Stuck-on, waxy, tan-brown papules and/or plaques  - Benign-appearing - Discussed benign etiology and prognosis. - Observe - Call for any changes  ACTINIC DAMAGE - chronic, secondary to  cumulative UV radiation exposure/sun exposure over time - diffuse scaly erythematous macules with underlying dyspigmentation - Recommend daily broad spectrum sunscreen SPF 30+ to sun-exposed areas, reapply every 2 hours as needed.  - Recommend staying in the shade or wearing long sleeves, sun glasses (UVA+UVB protection) and wide brim hats (4-inch brim around the entire circumference of the hat). - Call for new or changing lesions.  INFLAMED SEBORRHEIC KERATOSIS left cheek/zygoma Symptomatic, irritating, patient would like treated.  Destruction of lesion - left cheek/zygoma Complexity: simple   Destruction method: cryotherapy   Informed consent: discussed and consent obtained   Timeout:  patient name, date of birth, surgical site, and procedure verified Lesion destroyed using liquid nitrogen: Yes   Region frozen until ice ball extended beyond lesion: Yes   Outcome: patient tolerated procedure well with no complications   Post-procedure details: wound care instructions given   Additional details:  Prior to procedure, discussed risks of blister formation, small wound, skin dyspigmentation, or rare scar following cryotherapy. Recommend Vaseline ointment to treated areas while healing.   Return in about 1 year (around 02/11/2025) for Atopic dermatitis .  I, Ardis Rowan, RMA, am acting as scribe for Armida Sans, MD .   Documentation: I have reviewed the above documentation for accuracy and completeness, and I agree with the above.  Armida Sans, MD

## 2024-02-12 NOTE — Patient Instructions (Addendum)

## 2024-02-17 ENCOUNTER — Other Ambulatory Visit: Payer: Self-pay | Admitting: Nurse Practitioner

## 2024-02-18 NOTE — Telephone Encounter (Signed)
 Requested medication (s) are due for refill today: Yes  Requested medication (s) are on the active medication list: Yes  Last refill:  12/07/23  Future visit scheduled: Yes  Notes to clinic:  Unable to refill per protocol, cannot delegate.      Requested Prescriptions  Pending Prescriptions Disp Refills   LORazepam (ATIVAN) 0.5 MG tablet [Pharmacy Med Name: LORAZEPAM 0.5 MG TABLET] 180 tablet 0    Sig: TAKE 1 TAB BY MOUTH TWICE A DAY (USE THE ADDITIONAL 30 TABS ONLY SPARINGLY & ONLY FOR SEVERE ANXIETY)     Not Delegated - Psychiatry: Anxiolytics/Hypnotics 2 Failed - 02/18/2024  4:26 PM      Failed - This refill cannot be delegated      Passed - Urine Drug Screen completed in last 360 days      Passed - Patient is not pregnant      Passed - Valid encounter within last 6 months    Recent Outpatient Visits           2 months ago Type 2 diabetes mellitus with hyperglycemia, with long-term current use of insulin (HCC)   [place] [facility] [place], [place] T, NP   5 months ago Type 2 diabetes mellitus with hyperglycemia, with long-term current use of insulin (HCC)   [place] [facility] [place], [place] T, NP   8 months ago Type 2 diabetes mellitus with hyperglycemia, with long-term current use of insulin (HCC)   [place] [facility] [place], [place] T, NP   11 months ago Type 2 diabetes mellitus with hyperglycemia, with long-term current use of insulin (HCC)   [place] [facility] [place], [place] T, NP   1 year ago Type 2 diabetes mellitus with hyperglycemia, with long-term current use of insulin (HCC)   [place] [facility] [place], Dorie Rank, NP       Future Appointments             In 1 month [place], Dorie Rank, NP [place] [facility]   In 1 year Deirdre Evener, MD [facility]

## 2024-02-26 DIAGNOSIS — M0609 Rheumatoid arthritis without rheumatoid factor, multiple sites: Secondary | ICD-10-CM | POA: Diagnosis not present

## 2024-03-11 DIAGNOSIS — M150 Primary generalized (osteo)arthritis: Secondary | ICD-10-CM | POA: Diagnosis not present

## 2024-03-11 DIAGNOSIS — Z79899 Other long term (current) drug therapy: Secondary | ICD-10-CM | POA: Diagnosis not present

## 2024-03-11 DIAGNOSIS — M0609 Rheumatoid arthritis without rheumatoid factor, multiple sites: Secondary | ICD-10-CM | POA: Diagnosis not present

## 2024-03-16 NOTE — Patient Instructions (Incomplete)
Be Involved in Caring For Your Health:  Taking Medications When medications are taken as directed, they can greatly improve your health. But if they are not taken as prescribed, they may not work. In some cases, not taking them correctly can be harmful. To help ensure your treatment remains effective and safe, understand your medications and how to take them. Bring your medications to each visit for review by your provider.  Your lab results, notes, and after visit summary will be available on My Chart. We strongly encourage you to use this feature. If lab results are abnormal the clinic will contact you with the appropriate steps. If the clinic does not contact you assume the results are satisfactory. You can always view your results on My Chart. If you have questions regarding your health or results, please contact the clinic during office hours. You can also ask questions on My Chart.  We at [facility] are grateful that you chose [place] to provide your care. We strive to provide evidence-based and compassionate care and are always looking for feedback. If you get a survey from the clinic please complete this so we can hear your opinions.  Diabetes Mellitus and Exercise Regular exercise is important for your health, especially if you have diabetes mellitus. Exercise is not just about losing weight. It can also help you increase muscle strength and bone density and reduce body fat and stress. This can help your level of endurance and make you more fit and flexible. Why should I exercise if I have diabetes? Exercise has many benefits for people with diabetes. It can: Help lower and control your blood sugar (glucose). Help your body respond better and become more sensitive to the hormone insulin. Reduce how much insulin your body needs. Lower your risk for heart disease by: Lowering how much "bad" cholesterol and triglycerides you have in your body. Increasing how much "good" cholesterol  you have in your body. Lowering your blood pressure. Lowering your blood glucose levels. What is my activity plan? Your health care provider or an expert trained in diabetes care (certified diabetes educator) can help you make an activity plan. This plan can help you find the type of exercise that works for you. It may also tell you how often to exercise and for how long. Be sure to: Get at least 150 minutes of medium-intensity or high-intensity exercise each week. This may involve brisk walking, biking, or water aerobics. Do stretching and strengthening exercises at least 2 times a week. This may involve yoga or weight lifting. Spread out your activity over at least 3 days of the week. Get some form of physical activity each day. Do not go more than 2 days in a row without some kind of activity. Avoid being inactive for more than 30 minutes at a time. Take frequent breaks to walk or stretch. Choose activities that you enjoy. Set goals that you know you can accomplish. Start slowly and increase the intensity of your exercise over time. How do I manage my diabetes during exercise?  Monitor your blood glucose Check your blood glucose before and after you exercise. If your blood glucose is 240 mg/dL (40.9 mmol/L) or higher before you exercise, check your urine for ketones. These are chemicals created by the liver. If you have ketones in your urine, do not exercise until your blood glucose returns to normal. If your blood glucose is 100 mg/dL (5.6 mmol/L) or lower, eat a snack that has 15-20 grams of carbohydrate in  it. Check your blood glucose 15 minutes after the snack to make sure that your level is above 100 mg/dL (5.6 mmol/L) before you start to exercise. Your risk for low blood glucose (hypoglycemia) goes up during and after exercise. Know the symptoms of this condition and how to treat it. Follow these instructions at home: Keep a carbohydrate snack on hand for use before, during, and after  exercise. This can help prevent or treat hypoglycemia. Avoid injecting insulin into parts of your body that are going to be used during exercise. This may include: Your arms, when you are going to play tennis. Your legs, when you are about to go jogging. Keep track of your exercise habits. This can help you and your health care provider watch and adjust your activity plan. Write down: What you eat before and after you exercise. Blood glucose levels before and after you exercise. The type and amount of exercise you do. Talk to your health care provider before you start a new activity. They may need to: Make sure that the activity is safe for you. Adjust your insulin, other medicines, and food that you eat. Drink water while you exercise. This can stop you from losing too much water (dehydration). It can also prevent problems caused by having a lot of heat in your body (heat stroke). Where to find more information American Diabetes Association: diabetes.org Association of Diabetes Care & Education Specialists: diabeteseducator.org This information is not intended to replace advice given to you by your health care provider. Make sure you discuss any questions you have with your health care provider. Document Revised: 05/03/2022 Document Reviewed: 05/03/2022 Elsevier Patient Education  2024 [employer].

## 2024-03-19 ENCOUNTER — Encounter: Payer: Self-pay | Admitting: Nurse Practitioner

## 2024-03-19 ENCOUNTER — Ambulatory Visit (INDEPENDENT_AMBULATORY_CARE_PROVIDER_SITE_OTHER): Payer: Self-pay | Admitting: Nurse Practitioner

## 2024-03-19 VITALS — BP 129/72 | HR 59 | Temp 97.4°F | Ht 65.0 in | Wt 156.4 lb

## 2024-03-19 DIAGNOSIS — K7460 Unspecified cirrhosis of liver: Secondary | ICD-10-CM | POA: Diagnosis not present

## 2024-03-19 DIAGNOSIS — I152 Hypertension secondary to endocrine disorders: Secondary | ICD-10-CM | POA: Diagnosis not present

## 2024-03-19 DIAGNOSIS — F413 Other mixed anxiety disorders: Secondary | ICD-10-CM

## 2024-03-19 DIAGNOSIS — M0609 Rheumatoid arthritis without rheumatoid factor, multiple sites: Secondary | ICD-10-CM | POA: Diagnosis not present

## 2024-03-19 DIAGNOSIS — Z79899 Other long term (current) drug therapy: Secondary | ICD-10-CM

## 2024-03-19 DIAGNOSIS — I25708 Atherosclerosis of coronary artery bypass graft(s), unspecified, with other forms of angina pectoris: Secondary | ICD-10-CM

## 2024-03-19 DIAGNOSIS — E1165 Type 2 diabetes mellitus with hyperglycemia: Secondary | ICD-10-CM

## 2024-03-19 DIAGNOSIS — E1159 Type 2 diabetes mellitus with other circulatory complications: Secondary | ICD-10-CM

## 2024-03-19 DIAGNOSIS — F339 Major depressive disorder, recurrent, unspecified: Secondary | ICD-10-CM

## 2024-03-19 DIAGNOSIS — G8929 Other chronic pain: Secondary | ICD-10-CM

## 2024-03-19 DIAGNOSIS — E1122 Type 2 diabetes mellitus with diabetic chronic kidney disease: Secondary | ICD-10-CM | POA: Diagnosis not present

## 2024-03-19 DIAGNOSIS — M810 Age-related osteoporosis without current pathological fracture: Secondary | ICD-10-CM

## 2024-03-19 DIAGNOSIS — E1169 Type 2 diabetes mellitus with other specified complication: Secondary | ICD-10-CM

## 2024-03-19 DIAGNOSIS — I771 Stricture of artery: Secondary | ICD-10-CM | POA: Diagnosis not present

## 2024-03-19 DIAGNOSIS — Z794 Long term (current) use of insulin: Secondary | ICD-10-CM | POA: Diagnosis not present

## 2024-03-19 DIAGNOSIS — N183 Chronic kidney disease, stage 3 unspecified: Secondary | ICD-10-CM | POA: Diagnosis not present

## 2024-03-19 DIAGNOSIS — E785 Hyperlipidemia, unspecified: Secondary | ICD-10-CM | POA: Diagnosis not present

## 2024-03-19 LAB — BAYER DCA HB A1C WAIVED: HB A1C (BAYER DCA - WAIVED): 7.5 % — ABNORMAL HIGH (ref 4.8–5.6)

## 2024-03-19 NOTE — Assessment & Plan Note (Signed)
 Chronic, stable Stage 3a, although recent labs have shown improvement.  Continue Losartan  for kidney protection.  Urine ALB 80 January 2025. Continue collaboration with cardiology.  Consider nephrology referral if worsening function.  Labs: CMP.

## 2024-03-19 NOTE — Assessment & Plan Note (Signed)
Chronic, ongoing.  Noted on imaging CT 07/28/22.  Continue statin and Plavix therapy. 

## 2024-03-19 NOTE — Assessment & Plan Note (Signed)
Stable, continue collaboration with cardiology and preventative treatment to include ASA, Plavix, and statin. 

## 2024-03-19 NOTE — Progress Notes (Signed)
 BP 129/72 (BP Location: Left Arm, Cuff Size: Normal)   Pulse (!) 59   Temp (!) 97.4 F (36.3 C) (Oral)   Ht 5\' 5"  (1.651 m)   Wt 156 lb 6.4 oz (70.9 kg)   SpO2 97%   BMI 26.03 kg/m    Subjective:    Patient ID: Laura Rojas, female    DOB: 12-07-1945, 78 y.o.   MRN: [id number]  HPI: Laura Rojas is a 78 y.o. female  Chief Complaint  Patient presents with   Depression   Diabetes   Hyperlipidemia   Hypertension   RA   DIABETES January A1c 8.2%.  Taking Basaglar , Novolog  sliding scale, and Metformin .  Saw endocrinology in past,last 05/16/21, after her provider there left she wanted to transition to PCP.  Per Freestyle average over past 90 days was 178. In target 53% of the time, above 46%, and low 1%. Around noon and dinner time numbers trend up. Takes B12 for history of low levels.  History of fracture to right second toe and did not heal correctly, this causes pressure to great toe and pain.  She has seen podiatry, but reports they would not do surgery due to her diabetes.  History: Januvia  in past but due to pancreatitis and elevation in Lipase and Amylase we stopped this.  Had GI visit on 10/18/23, abdominal ultrasound performed and stable, gallstones present but no inflammation.   Did not tolerate GLP1 (Trulicity) in past and had side effects with Farxiga .   Hypoglycemic episodes: 7 lows over past 90 days. Polydipsia/polyuria: no Visual disturbance: no Chest pain: no Paresthesias: no Glucose Monitoring: yes             Accucheck frequency: Freestyle as above             Fasting glucose:              Post prandial:             Evening:              Before meals: Taking Insulin ?: yes             Long acting insulin : 6 to 13 units on average             Short acting insulin : sliding scale units with meals (20 - 22 units) Blood Pressure Monitoring: rarely Retinal Examination: Up to Date -- [facility] Foot Exam: Up to Date Pneumovax: Up to Date Influenza: Up to  Date Aspirin: yes   HYPERTENSION / HYPERLIPIDEMIA/CAD Continues on Amlodipine , Plavix , HCTZ, Fish oil, Losartan , Lipitor.Saw Dr. Alvis Ba with cardiology, last visit was 01/23/24 and is to return annually due to subclavian artery stenosis and angina.  Satisfied with current treatment? yes Duration of hypertension: chronic BP monitoring frequency: not checking BP range:  BP medication side effects: no Duration of hyperlipidemia: chronic Cholesterol medication side effects: no Cholesterol supplements: fish oil Medication compliance: good compliance Aspirin: yes Recent stressors: no Recurrent headaches: no Visual changes: no Palpitations: no Dyspnea: no Chest pain: no Lower extremity edema: no Dizzy/lightheaded: no   CHRONIC KIDNEY DISEASE (CKD 3a) CKD status: stable Medications renally dose: yes Previous renal evaluation: no Pneumovax:  Up to Date Influenza Vaccine: Up to Date    POLYMYALGIA RHEUMATICA & RLS Has polymyalgia rheumatica syndrome. Rheumatology last 03/11/24, getting injections at this time, Cimzia .  These have kept pain under control. RLS is stable, no Gabapentin  at this time, she used in past. Feels discomfort in hands  more in the cold weather. Duration: chronic Involved joints: right hand, both feet, and left knee Mechanism of injury: arthritis Severity: 8/10 today Quality:  dull, aching and burning Frequency: intermittent  Radiation: no Aggravating factors: walking and movement  Alleviating factors: Aspercreme and APAP  Status: stable Treatments attempted: Tylenol  and Aspercreme Relief with NSAIDs?:  No NSAIDs Taken Weakness with weight bearing or walking: no Sensation of giving way: no  OSTEOPOROSIS Taking Fosamax  -- is tolerating well. Satisfied with current treatment?: yes Medication side effects: no Medication compliance: good compliance Past osteoporosis medications/treatments: Fosamax  Adequate calcium  & vitamin D : yes Intolerance to  bisphosphonates:no Weight bearing exercises: yes   ANXIETY/STRESS Continues to take Zoloft  150 MG daily.  Continues Ativan  0.5 MG, takes this at night only, although ordered BID.  Sometimes takes two every night (1 MG), started by previous PCP. Pt aware of risks of benzo medication use to include increased sedation, respiratory suppression, falls, dependence and cardiovascular events.  Pt would like to continue treatment as benefit determined to outweigh risk. On review PDMP last Ativan  fill 03/03/24 for 3 months supply, previous PCP ordered this way. Duration:stable Anxious mood: no Excessive worrying: no Irritability: occasional Sweating: no Nausea: no Palpitations:no Hyperventilation: no Panic attacks: no Agoraphobia: no  Obscessions/compulsions: no Depressed mood: no    03/19/2024    8:30 AM 10/16/2023    3:18 PM 09/11/2023   10:49 AM 06/08/2023   11:15 AM 03/09/2023   11:00 AM  Depression screen PHQ 2/9  Decreased Interest 0 0 0 0 0  Down, Depressed, Hopeless 0 0 0 0 0  PHQ - 2 Score 0 0 0 0 0  Altered sleeping 0 0 1 2 1   Tired, decreased energy 1 1 1 1 1   Change in appetite 2 0 1 1 1   Feeling bad or failure about yourself  0 0 0 0 0  Trouble concentrating 0 0 0 0 0  Moving slowly or fidgety/restless 0 0 0 0 0  Suicidal thoughts 0 0 0 0 0  PHQ-9 Score 3 1 3 4 3   Difficult doing work/chores Not difficult at all Not difficult at all Not difficult at all Not difficult at all Not difficult at all      03/19/2024    8:31 AM 09/11/2023   10:49 AM 06/08/2023   11:15 AM 03/09/2023   11:00 AM  GAD 7 : Generalized Anxiety Score  Nervous, Anxious, on Edge 0 0 0 0  Control/stop worrying 0 0 0 0  Worry too much - different things 0 0 0 0  Trouble relaxing 0 0 0 0  Restless 0 0 0 0  Easily annoyed or irritable 0 1 0 0  Afraid - awful might happen 0 0 0 0  Total GAD 7 Score 0 1 0 0  Anxiety Difficulty Not difficult at all Not difficult at all Not difficult at all Not difficult at  all      Relevant past medical, surgical, family and social history reviewed and updated as indicated. Interim medical history since our last visit reviewed. Allergies and medications reviewed and updated.  Review of Systems  Constitutional:  Negative for activity change, appetite change, diaphoresis, fatigue and fever.  Respiratory:  Negative for cough, chest tightness and shortness of breath.   Cardiovascular:  Negative for chest pain, palpitations and leg swelling.  Gastrointestinal: Negative.   Endocrine: Negative for cold intolerance, heat intolerance, polydipsia, polyphagia and polyuria.  Neurological:  Negative for dizziness, syncope, weakness, light-headedness, numbness  and headaches.  Psychiatric/Behavioral: Negative.      Per HPI unless specifically indicated above     Objective:    BP 129/72 (BP Location: Left Arm, Cuff Size: Normal)   Pulse (!) 59   Temp (!) 97.4 F (36.3 C) (Oral)   Ht 5\' 5"  (1.651 m)   Wt 156 lb 6.4 oz (70.9 kg)   SpO2 97%   BMI 26.03 kg/m   Wt Readings from Last 3 Encounters:  03/19/24 156 lb 6.4 oz (70.9 kg)  01/23/24 156 lb (70.8 kg)  12/12/23 154 lb 9.6 oz (70.1 kg)    Physical Exam Vitals and nursing note reviewed.  Constitutional:      General: She is awake. She is not in acute distress.    Appearance: She is well-developed, well-groomed and overweight. She is not ill-appearing.  HENT:     Head: Normocephalic.     Right Ear: Hearing normal.     Left Ear: Hearing normal.  Eyes:     General: Lids are normal.        Right eye: No discharge.        Left eye: No discharge.     Conjunctiva/sclera: Conjunctivae normal.     Pupils: Pupils are equal, round, and reactive to light.  Neck:     Thyroid : No thyromegaly.     Vascular: Carotid bruit (bilateral) present.  Cardiovascular:     Rate and Rhythm: Normal rate and regular rhythm.     Heart sounds: Normal heart sounds. No murmur heard.    No gallop.  Pulmonary:     Effort:  Pulmonary effort is normal. No accessory muscle usage or respiratory distress.     Breath sounds: No wheezing or rhonchi.  Abdominal:     General: Bowel sounds are normal.     Palpations: Abdomen is soft.  Musculoskeletal:     Cervical back: Normal range of motion and neck supple.     Right lower leg: No edema.     Left lower leg: No edema.  Skin:    General: Skin is warm and dry.  Neurological:     Mental Status: She is alert and oriented to person, place, and time.  Psychiatric:        Attention and Perception: Attention normal.        Mood and Affect: Mood normal.        Speech: Speech normal.        Behavior: Behavior normal. Behavior is cooperative.        Thought Content: Thought content normal.    Diabetic Foot Exam - Simple   Simple Foot Form Visual Inspection See comments: Yes Sensation Testing See comments: Yes Pulse Check Posterior Tibialis and Dorsalis pulse intact bilaterally: Yes Comments Pulses PT and DP bilateral 2+.  Sensation 9/10 right and 8/10 left.  Second toe right foot with deformity due to past fracture, placing pressure against great toe.  Skin intact.     Results for orders placed or performed in visit on 03/19/24  Bayer DCA Hb A1c Waived   Collection Time: 03/19/24  8:35 AM  Result Value Ref Range   HB A1C (BAYER DCA - WAIVED) 7.5 (H) 4.8 - 5.6 %      Assessment & Plan:   Problem List Items Addressed This Visit       Cardiovascular and Mediastinum   Subclavian artery stenosis, left (HCC)   Stable, continue collaboration with cardiology and preventative treatment to include ASA, Plavix , and statin.  Relevant Orders   Comprehensive metabolic panel with GFR   Lipid Panel w/o Chol/HDL Ratio   Hypertension associated with diabetes (HCC)   Chronic, stable.  BP at goal for age.  Continue current medication regimen and collaboration with cardiology. Labs: CMP.  Recommend she monitor BP at least three days a week at home and document for  providers + bring to visits.  DASH diet focus.  Return in 3 months.      Relevant Orders   Bayer DCA Hb A1c Waived (Completed)   Coronary artery disease of bypass graft of native heart with stable angina pectoris (HCC)   Chronic, stable.  No recent chest pain.  Continue collaboration with cardiology and preventative treatment, including statin and ASA.      Relevant Orders   Comprehensive metabolic panel with GFR   Lipid Panel w/o Chol/HDL Ratio     Digestive   Cirrhosis, non-alcoholic (HCC)   Ongoing, will continue collaboration with GI at this time.  Remain off Januvia  and GLP1.  Recent GI notes reviewed.  Labs today.      Relevant Orders   Amylase   Lipase     Endocrine   Type 2 diabetes mellitus with hyperglycemia, with long-term current use of insulin  (HCC) - Primary   Chronic, ongoing, followed by endocrinology in past.  A1c 7.5% today, trend up from >8% and at goal for age per geriatric society. Her previous levels in 9-10 range on average, including with endocrinology.  She wishes to avoid return to endo at this time as her previous provider left.  Will continue Basaglar  and adjust further as needed based on A1c result + continue sliding scale and Metformin .  Continue collaboration with [facility] as needed.  For Novolog  recommend increasing to 23 to 24 units prior to lunch and dinner where her sugars peak the most on review.  Continue Metformin  and adjust as needed based on kidney function.  Bring Libre to all visits.  Return to office in 3 months -- goal to avoid hypoglycemia.  If consistent poor control will get back into endo.  She will focus on returning to previous diet monitoring now that holidays are done. - Eye and foot exam up to date - Statin on board.  ARB on board. - Vaccinations up to date.      Relevant Orders   Bayer DCA Hb A1c Waived (Completed)   Hyperlipidemia associated with type 2 diabetes mellitus (HCC)   Chronic, ongoing.  Continue current medication  regimen and adjust as needed.  Lipid panel today.  Discussed with patient, if myalgias with Atorvastatin , + her history of myalgia with statins, could consider Repatha initiation if poor tolerance.      Relevant Orders   Bayer DCA Hb A1c Waived (Completed)   Comprehensive metabolic panel with GFR   Lipid Panel w/o Chol/HDL Ratio   CKD stage 3 due to type 2 diabetes mellitus (HCC)   Chronic, stable Stage 3a, although recent labs have shown improvement.  Continue Losartan  for kidney protection.  Urine ALB 80 January 2025. Continue collaboration with cardiology.  Consider nephrology referral if worsening function.  Labs: CMP.      Relevant Orders   Bayer DCA Hb A1c Waived (Completed)   Comprehensive metabolic panel with GFR     Musculoskeletal and Integument   Rheumatoid arthritis of multiple sites with negative rheumatoid factor (HCC)   Chronic, stable.  Followed by rheumatology.  Continue this collaboration.  Recent notes reviewed.  Has injections with  them.       Relevant Orders   Comprehensive metabolic panel with GFR   Osteoporosis   Stable, tolerating Fosamax  without ADR.  Monitor closely. Continue Vitamin D  supplement daily and adequate calcium  intake.  Repeat DEXA 12/23/25.      Relevant Medications   alendronate  (FOSAMAX ) 70 MG tablet   Other Relevant Orders   VITAMIN D  25 Hydroxy (Vit-D Deficiency, Fractures)     Other   Long-term current use of benzodiazepine   Chronic, ongoing for insomnia & anxiety.  Has had trials of other medications and reductions with poor outcomes.  Fully aware of risks of chronic benzo use and aware this will be discussed each visit.  UDS obtained today (due next 06/07/24) and contract on file.      Insulin  long-term use (HCC)   Refer to diabetes with hyperglycemia plan of care.      Relevant Orders   Bayer DCA Hb A1c Waived (Completed)   Comprehensive metabolic panel with GFR   Depression, recurrent (HCC)   Chronic, ongoing.  Denies SI/HI.   Continue Sertraline  150 MG daily as is ordered. This is offering benefit to mood overall and may help reduce benzo use.  Consider increase to 200 MG in future for increased benefit, she does not want to change at this time.  Return in 3 months.      Anxiety disorder   Chronic, ongoing.  Denies SI/HI.  Continue Sertraline  and Ativan .  Has had multiple attempts at reduction of benzo with no success.  Discussed at length risks with her and she is aware.  Continue Ativan  and fill upon request.  PDMP reviewed.  Return in 3 months, UDS due next 06/07/24 and contract on file.        Follow up plan: Return in about 3 months (around 06/18/2024) for T2DM, HTN/HLD, MOOD.

## 2024-03-19 NOTE — Assessment & Plan Note (Signed)
Chronic, ongoing for insomnia & anxiety.  Has had trials of other medications and reductions with poor outcomes.  Fully aware of risks of chronic benzo use and aware this will be discussed each visit.  UDS obtained today (due next 06/07/24) and contract on file.

## 2024-03-19 NOTE — Assessment & Plan Note (Signed)
 Chronic, ongoing, followed by endocrinology in past.  A1c 7.5% today, trend up from >8% and at goal for age per geriatric society. Her previous levels in 9-10 range on average, including with endocrinology.  She wishes to avoid return to endo at this time as her previous provider left.  Will continue Basaglar  and adjust further as needed based on A1c result + continue sliding scale and Metformin .  Continue collaboration with [facility] as needed.  For Novolog  recommend increasing to 23 to 24 units prior to lunch and dinner where her sugars peak the most on review.  Continue Metformin  and adjust as needed based on kidney function.  Bring Libre to all visits.  Return to office in 3 months -- goal to avoid hypoglycemia.  If consistent poor control will get back into endo.  She will focus on returning to previous diet monitoring now that holidays are done. - Eye and foot exam up to date - Statin on board.  ARB on board. - Vaccinations up to date.

## 2024-03-19 NOTE — Assessment & Plan Note (Signed)
 Stable, tolerating Fosamax  without ADR.  Monitor closely. Continue Vitamin D  supplement daily and adequate calcium  intake.  Repeat DEXA 12/23/25.

## 2024-03-19 NOTE — Assessment & Plan Note (Signed)
Refer to diabetes with hyperglycemia plan of care.

## 2024-03-19 NOTE — Assessment & Plan Note (Signed)
Chronic, stable.  No recent chest pain.  Continue collaboration with cardiology and preventative treatment, including statin and ASA.

## 2024-03-19 NOTE — Assessment & Plan Note (Signed)
 Chronic, ongoing.  Denies SI/HI.  Continue Sertraline  150 MG daily as is ordered. This is offering benefit to mood overall and may help reduce benzo use.  Consider increase to 200 MG in future for increased benefit, she does not want to change at this time.  Return in 3 months.

## 2024-03-19 NOTE — Assessment & Plan Note (Signed)
Chronic, ongoing.  Continue current medication regimen and adjust as needed.  Lipid panel today.  Discussed with patient, if myalgias with Atorvastatin, + her history of myalgia with statins, could consider Repatha initiation if poor tolerance.

## 2024-03-19 NOTE — Assessment & Plan Note (Signed)
Chronic, stable.  BP at goal for age.  Continue current medication regimen and collaboration with cardiology. Labs: CMP.  Recommend she monitor BP at least three days a week at home and document for providers + bring to visits.  DASH diet focus.  Return in 3 months.

## 2024-03-19 NOTE — Assessment & Plan Note (Signed)
Chronic, stable.  Followed by rheumatology.  Continue this collaboration.  Recent notes reviewed.  Has injections with them.

## 2024-03-19 NOTE — Assessment & Plan Note (Signed)
Chronic, ongoing.  Denies SI/HI.  Continue Sertraline and Ativan.  Has had multiple attempts at reduction of benzo with no success.  Discussed at length risks with her and she is aware.  Continue Ativan and fill upon request.  PDMP reviewed.  Return in 3 months, UDS due next 06/07/24 and contract on file.

## 2024-03-19 NOTE — Assessment & Plan Note (Signed)
 To second toe due to past fracture. Provided her information on Pedifix gel to place around toe when up and moving to reduce pressure and pain.  Skin currently intact.  If worsening or ongoing get back into podiatry.

## 2024-03-19 NOTE — Assessment & Plan Note (Signed)
 Ongoing, will continue collaboration with GI at this time.  Remain off Januvia  and GLP1.  Recent GI notes reviewed.  Labs today.

## 2024-03-20 ENCOUNTER — Encounter: Payer: Self-pay | Admitting: Nurse Practitioner

## 2024-03-20 LAB — COMPREHENSIVE METABOLIC PANEL WITH GFR
ALT: 23 IU/L (ref 0–32)
AST: 23 IU/L (ref 0–40)
Albumin: 4.4 g/dL (ref 3.8–4.8)
Alkaline Phosphatase: 124 IU/L — ABNORMAL HIGH (ref 44–121)
BUN/Creatinine Ratio: 20 (ref 12–28)
BUN: 18 mg/dL (ref 8–27)
Bilirubin Total: 0.4 mg/dL (ref 0.0–1.2)
CO2: 25 mmol/L (ref 20–29)
Calcium: 9.4 mg/dL (ref 8.7–10.3)
Chloride: 104 mmol/L (ref 96–106)
Creatinine, Ser: 0.92 mg/dL (ref 0.57–1.00)
Globulin, Total: 1.8 g/dL (ref 1.5–4.5)
Glucose: 196 mg/dL — ABNORMAL HIGH (ref 70–99)
Potassium: 4.5 mmol/L (ref 3.5–5.2)
Sodium: 141 mmol/L (ref 134–144)
Total Protein: 6.2 g/dL (ref 6.0–8.5)
eGFR: 64 mL/min/{1.73_m2} (ref >59)

## 2024-03-20 LAB — LIPASE: Lipase: 228 U/L — ABNORMAL HIGH (ref 14–85)

## 2024-03-20 LAB — VITAMIN D 25 HYDROXY (VIT D DEFICIENCY, FRACTURES): Vit D, 25-Hydroxy: 39 ng/mL (ref 30.0–100.0)

## 2024-03-20 LAB — LIPID PANEL W/O CHOL/HDL RATIO
Cholesterol, Total: 157 mg/dL (ref 100–199)
HDL: 59 mg/dL (ref >39)
LDL Chol Calc (NIH): 66 mg/dL (ref 0–99)
Triglycerides: 194 mg/dL — ABNORMAL HIGH (ref 0–149)
VLDL Cholesterol Cal: 32 mg/dL (ref 5–40)

## 2024-03-20 LAB — AMYLASE: Amylase: 104 U/L (ref 31–110)

## 2024-03-20 NOTE — Progress Notes (Signed)
 Contacted via MyChart   Good afternoon Armetta, your labs have returned: - Kidney function, creatinine and eGFR, remains normal, as is liver function, AST and ALT.  - Lipid panel shows LDL remains stable. Triglycerides a little elevated.  Continue medications and focus on healthy diet. - Pancreas labs -- amylase and lipase, show ongoing elevation in Lipase.  Continue to attend visits with GI provider as scheduled. - Vitamin D  level normal.  Any questions? Keep being stellar!!  Thank you for allowing me to participate in your care.  I appreciate you. Kindest regards, Keigan Girten

## 2024-03-25 DIAGNOSIS — M0609 Rheumatoid arthritis without rheumatoid factor, multiple sites: Secondary | ICD-10-CM | POA: Diagnosis not present

## 2024-04-17 DIAGNOSIS — K5909 Other constipation: Secondary | ICD-10-CM | POA: Diagnosis not present

## 2024-04-17 DIAGNOSIS — R1012 Left upper quadrant pain: Secondary | ICD-10-CM | POA: Diagnosis not present

## 2024-04-17 DIAGNOSIS — K7460 Unspecified cirrhosis of liver: Secondary | ICD-10-CM | POA: Diagnosis not present

## 2024-04-17 DIAGNOSIS — Z794 Long term (current) use of insulin: Secondary | ICD-10-CM | POA: Diagnosis not present

## 2024-04-17 DIAGNOSIS — E1165 Type 2 diabetes mellitus with hyperglycemia: Secondary | ICD-10-CM | POA: Diagnosis not present

## 2024-04-17 DIAGNOSIS — K5730 Diverticulosis of large intestine without perforation or abscess without bleeding: Secondary | ICD-10-CM | POA: Diagnosis not present

## 2024-04-17 DIAGNOSIS — K766 Portal hypertension: Secondary | ICD-10-CM | POA: Diagnosis not present

## 2024-04-22 ENCOUNTER — Other Ambulatory Visit: Payer: Self-pay | Admitting: Internal Medicine

## 2024-04-22 DIAGNOSIS — K766 Portal hypertension: Secondary | ICD-10-CM

## 2024-04-22 DIAGNOSIS — K7460 Unspecified cirrhosis of liver: Secondary | ICD-10-CM

## 2024-04-23 DIAGNOSIS — K766 Portal hypertension: Secondary | ICD-10-CM | POA: Diagnosis not present

## 2024-04-23 DIAGNOSIS — K8020 Calculus of gallbladder without cholecystitis without obstruction: Secondary | ICD-10-CM | POA: Diagnosis not present

## 2024-04-23 DIAGNOSIS — K7460 Unspecified cirrhosis of liver: Secondary | ICD-10-CM | POA: Diagnosis not present

## 2024-04-23 DIAGNOSIS — K5730 Diverticulosis of large intestine without perforation or abscess without bleeding: Secondary | ICD-10-CM | POA: Diagnosis not present

## 2024-04-23 DIAGNOSIS — Z90710 Acquired absence of both cervix and uterus: Secondary | ICD-10-CM | POA: Diagnosis not present

## 2024-04-24 DIAGNOSIS — M0609 Rheumatoid arthritis without rheumatoid factor, multiple sites: Secondary | ICD-10-CM | POA: Diagnosis not present

## 2024-04-30 ENCOUNTER — Encounter: Payer: Self-pay | Admitting: Nurse Practitioner

## 2024-04-30 ENCOUNTER — Ambulatory Visit: Admitting: Nurse Practitioner

## 2024-04-30 ENCOUNTER — Ambulatory Visit: Payer: Self-pay

## 2024-04-30 VITALS — BP 132/72 | HR 64 | Temp 98.6°F | Resp 15 | Ht 65.0 in | Wt 154.8 lb

## 2024-04-30 DIAGNOSIS — T7840XA Allergy, unspecified, initial encounter: Secondary | ICD-10-CM | POA: Diagnosis not present

## 2024-04-30 MED ORDER — TRIAMCINOLONE ACETONIDE 40 MG/ML IJ SUSP
40.0000 mg | Freq: Once | INTRAMUSCULAR | Status: AC
  Administered 2024-04-30: 40 mg via INTRAMUSCULAR

## 2024-04-30 MED ORDER — PREDNISONE 10 MG PO TABS: 10.0000 mg | ORAL_TABLET | Freq: Every day | ORAL | 0 refills | Status: DC

## 2024-04-30 NOTE — Telephone Encounter (Signed)
 FYI to provider  Patient was last seen in office on 03/19/24. Pt called in for triage for widespread rash. Patient has attempted cortisone cream and "old prescription for itching" that has not offered relief. Symptoms began Monday night.  Triage Disposition: See Physician within 24 hours. Patient requesting earliest availability. Appt scheduled.    Copied from CRM [id number]. Topic: Clinical - Red Word Triage >> Apr 30, 2024  8:05 AM Ivette P wrote: Red Word that prompted transfer to Nurse Triage: Rash Broke out all over body. - started Monday night on back and yesterday spread over the body. used cortozone and made it worse. Reason for Disposition  SEVERE itching (i.e., interferes with sleep, normal activities or school)  Answer Assessment - Initial Assessment Questions 1. APPEARANCE of RASH: "Describe the rash." (e.g., spots, blisters, raised areas, skin peeling, scaly)     Red and splotchy - no drainage 2. SIZE: "How big are the spots?" (e.g., tip of pen, eraser, coin; inches, centimeters)     "Bigger than a pen top and some places it looks clustered" 3. LOCATION: "Where is the rash located?"     Back, stomach, breasts, neck 4. COLOR: "What color is the rash?" (Note: It is difficult to assess rash color in people with darker-colored skin. When this situation occurs, simply ask the caller to describe what they see.)     Red 5. ONSET: "When did the rash begin?"     Monday night 6. FEVER: "Do you have a fever?" If Yes, ask: "What is your temperature, how was it measured, and when did it start?"     No 7. ITCHING: "Does the rash itch?" If Yes, ask: "How bad is the itch?" (Scale 1-10; or mild, moderate, severe)     10 8. CAUSE: "What do you think is causing the rash?"     Patient unsure - states she did cut some rose bushes on Monday afternoon. Also states her husband had bought some cheap laundry detergent that is new for this patient 9. MEDICINE FACTORS: "Have you started any new medicines  within the last 2 weeks?" (e.g., antibiotics)      No 10. OTHER SYMPTOMS: "Do you have any other symptoms?" (e.g., dizziness, headache, sore throat, joint pain)       No   Patient denies: throat tightness, dizziness  Protocols used: Rash or Redness - [facility]

## 2024-04-30 NOTE — Progress Notes (Signed)
 BP 132/72 (BP Location: Left Arm, Patient Position: Sitting, Cuff Size: Normal)   Pulse 64   Temp 98.6 F (37 C) (Oral)   Resp 15   Ht 5\' 5"  (1.651 m)   Wt 154 lb 12.8 oz (70.2 kg)   SpO2 98%   BMI 25.76 kg/m    Subjective:    Patient ID: Laura Rojas, female    DOB: 22-Nov-1946, 78 y.o.   MRN: [id number]  HPI: Laura Rojas is a 78 y.o. female  Chief Complaint  Patient presents with   Rash    All over rash started Monday night. Very itchy. Tired cortisone cream.    RASH Duration:  started Monday  Location: generalized - mostly on her trunk Itching: yes Burning: no Redness: yes Oozing: no Scaling: no Blisters: no Painful: no Fevers: no Change in detergents/soaps/personal care products: possibly in her towels but not with the rest of her clothes Recent illness: no Recent travel:no History of same: no Context: worse Alleviating factors: nothing Treatments attempted:hydrocortisone  cream Shortness of breath: no  Throat/tongue swelling: no Myalgias/arthralgias: no Was in her garden with her rose bushes on Monday and started after that.    Relevant past medical, surgical, family and social history reviewed and updated as indicated. Interim medical history since our last visit reviewed. Allergies and medications reviewed and updated.  Review of Systems  Constitutional:  Negative for fever.  HENT:  Negative for trouble swallowing.   Respiratory:  Negative for shortness of breath.   Skin:  Positive for rash.    Per HPI unless specifically indicated above     Objective:     BP 132/72 (BP Location: Left Arm, Patient Position: Sitting, Cuff Size: Normal)   Pulse 64   Temp 98.6 F (37 C) (Oral)   Resp 15   Ht 5\' 5"  (1.651 m)   Wt 154 lb 12.8 oz (70.2 kg)   SpO2 98%   BMI 25.76 kg/m   Wt Readings from Last 3 Encounters:  04/30/24 154 lb 12.8 oz (70.2 kg)  03/19/24 156 lb 6.4 oz (70.9 kg)  01/23/24 156 lb (70.8 kg)    Physical Exam Vitals and nursing  note reviewed.  Constitutional:      General: She is not in acute distress.    Appearance: Normal appearance. She is normal weight. She is not ill-appearing, toxic-appearing or diaphoretic.  HENT:     Head: Normocephalic.     Right Ear: External ear normal.     Left Ear: External ear normal.     Nose: Nose normal.     Mouth/Throat:     Mouth: Mucous membranes are moist.     Pharynx: Oropharynx is clear.  Eyes:     General:        Right eye: No discharge.        Left eye: No discharge.     Extraocular Movements: Extraocular movements intact.     Conjunctiva/sclera: Conjunctivae normal.     Pupils: Pupils are equal, round, and reactive to light.  Cardiovascular:     Rate and Rhythm: Normal rate and regular rhythm.     Heart sounds: No murmur heard. Pulmonary:     Effort: Pulmonary effort is normal. No respiratory distress.     Breath sounds: Normal breath sounds. No wheezing or rales.  Musculoskeletal:     Cervical back: Normal range of motion and neck supple.  Skin:    General: Skin is warm and dry.  Capillary Refill: Capillary refill takes less than 2 seconds.     Findings: Rash present.     Comments: Hives mostly on truck- worse on her back. Minimal areas on arms and legs  Neurological:     General: No focal deficit present.     Mental Status: She is alert and oriented to person, place, and time. Mental status is at baseline.  Psychiatric:        Mood and Affect: Mood normal.        Behavior: Behavior normal.        Thought Content: Thought content normal.        Judgment: Judgment normal.     Results for orders placed or performed in visit on 03/19/24  Bayer DCA Hb A1c Waived   Collection Time: 03/19/24  8:35 AM  Result Value Ref Range   HB A1C (BAYER DCA - WAIVED) 7.5 (H) 4.8 - 5.6 %  Comprehensive metabolic panel with GFR   Collection Time: 03/19/24  8:35 AM  Result Value Ref Range   Glucose 196 (H) 70 - 99 mg/dL   BUN 18 8 - 27 mg/dL   Creatinine, Ser 8.29  0.57 - 1.00 mg/dL   eGFR 64 >56 OZ/HYQ/6.57   BUN/Creatinine Ratio 20 12 - 28   Sodium 141 134 - 144 mmol/L   Potassium 4.5 3.5 - 5.2 mmol/L   Chloride 104 96 - 106 mmol/L   CO2 25 20 - 29 mmol/L   Calcium  9.4 8.7 - 10.3 mg/dL   Total Protein 6.2 6.0 - 8.5 g/dL   Albumin 4.4 3.8 - 4.8 g/dL   Globulin, Total 1.8 1.5 - 4.5 g/dL   Bilirubin Total 0.4 0.0 - 1.2 mg/dL   Alkaline Phosphatase 124 (H) 44 - 121 IU/L   AST 23 0 - 40 IU/L   ALT 23 0 - 32 IU/L  Lipid Panel w/o Chol/HDL Ratio   Collection Time: 03/19/24  8:35 AM  Result Value Ref Range   Cholesterol, Total 157 100 - 199 mg/dL   Triglycerides 846 (H) 0 - 149 mg/dL   HDL 59 >96 mg/dL   VLDL Cholesterol Cal 32 5 - 40 mg/dL   LDL Chol Calc (NIH) 66 0 - 99 mg/dL  Amylase   Collection Time: 03/19/24  8:35 AM  Result Value Ref Range   Amylase 104 31 - 110 U/L  Lipase   Collection Time: 03/19/24  8:35 AM  Result Value Ref Range   Lipase 228 (H) 14 - 85 U/L  VITAMIN D  25 Hydroxy (Vit-D Deficiency, Fractures)   Collection Time: 03/19/24  8:35 AM  Result Value Ref Range   Vit D, 25-Hydroxy 39.0 30.0 - 100.0 ng/mL      Assessment & Plan:   Problem List Items Addressed This Visit   None Visit Diagnoses       Allergic reaction, initial encounter    -  Primary   Triamcinalone given in office. Start Oral prednisone  tomorrow.  Keep close eye on sugars and dial in diet. Follow up if symptoms are not improved.   Relevant Medications   triamcinolone  acetonide (KENALOG -40) injection 40 mg        Follow up plan: No follow-ups on file.

## 2024-05-01 ENCOUNTER — Encounter: Payer: Self-pay | Admitting: Dermatology

## 2024-05-01 ENCOUNTER — Ambulatory Visit: Admitting: Dermatology

## 2024-05-01 DIAGNOSIS — Z7189 Other specified counseling: Secondary | ICD-10-CM

## 2024-05-01 DIAGNOSIS — L509 Urticaria, unspecified: Secondary | ICD-10-CM | POA: Diagnosis not present

## 2024-05-01 DIAGNOSIS — Z79899 Other long term (current) drug therapy: Secondary | ICD-10-CM

## 2024-05-01 NOTE — Patient Instructions (Addendum)
 For Hives (urticaria); dermatographism or Itch: Start non sedating antihistamine (either Allegra 180mg , or Claritin 10mg , or Zyrtec 10mg ) daily.  All these are non-prescription ("Over the Counter").   Start out with 1 pill a day.   After a week if not improving may increase to 2 pills a day.   After another week if not improving may increase to 3 pills a day.   After another week if still not improving may take up to 4 pills a day. Stay at highest dose that keeps condition controlled, but only up to 4 pills a day. Stay at the controlling dose for at least 2 weeks. Contact office if taking 4 pills of antihistamine a day for at least 2 weeks without control of condition as other options may be available. Stay on this treatment for 2 - 4 weeks   Contact us  by mychart  on Wednesday and let us  know how you are doing and what you are taking. If still itchy let us  know, we can bring you back in next Thursday for follow up.   Continue Prednisone  as prescribed   Due to recent changes in healthcare laws, you may see results of your pathology and/or laboratory studies on MyChart before the doctors have had a chance to review them. We understand that in some cases there may be results that are confusing or concerning to you. Please understand that not all results are received at the same time and often the doctors may need to interpret multiple results in order to provide you with the best plan of care or course of treatment. Therefore, we ask that you please give us  2 business days to thoroughly review all your results before contacting the office for clarification. Should we see a critical lab result, you will be contacted sooner.   If You Need Anything After Your Visit  If you have any questions or concerns for your doctor, please call our main line at [phone] and press option 4 to reach your doctor's medical assistant. If no one answers, please leave a voicemail as directed and we will return  your call as soon as possible. Messages left after 4 pm will be answered the following business day.   You may also send us  a message via MyChart. We typically respond to MyChart messages within 1-2 business days.  For prescription refills, please ask your pharmacy to contact our office. Our fax number is [phone].  If you have an urgent issue when the clinic is closed that cannot wait until the next business day, you can page your doctor at the number below.    Please note that while we do our best to be available for urgent issues outside of office hours, we are not available 24/7.   If you have an urgent issue and are unable to reach us , you may choose to seek medical care at your doctor's office, retail clinic, urgent care center, or emergency room.  If you have a medical emergency, please immediately call 911 or go to the emergency department.  Pager Numbers  - Dr. Bary Likes: [phone]  - Dr. Annette Barters: [phone]  - Dr. Felipe Horton: [phone]   In the event of inclement weather, please call our main line at [phone] for an update on the status of any delays or closures.  Dermatology Medication Tips: Please keep the boxes that topical medications come in in order to help keep track of the instructions about where and how to use these. Pharmacies typically print the  medication instructions only on the boxes and not directly on the medication tubes.   If your medication is too expensive, please contact our office at [phone] option 4 or send us  a message through MyChart.   We are unable to tell what your co-pay for medications will be in advance as this is different depending on your insurance coverage. However, we may be able to find a substitute medication at lower cost or fill out paperwork to get insurance to cover a needed medication.   If a prior authorization is required to get your medication covered by your insurance company, please allow us  1-2 business days to  complete this process.  Drug prices often vary depending on where the prescription is filled and some pharmacies may offer cheaper prices.  The website www.goodrx.com contains coupons for medications through different pharmacies. The prices here do not account for what the cost may be with help from insurance (it may be cheaper with your insurance), but the website can give you the price if you did not use any insurance.  - You can print the associated coupon and take it with your prescription to the pharmacy.  - You may also stop by our office during regular business hours and pick up a GoodRx coupon card.  - If you need your prescription sent electronically to a different pharmacy, notify our office through [facility] or by phone at [phone] option 4.     Si Usted Necesita Algo Despus de Su Visita  Tambin puede enviarnos un mensaje a travs de Clinical cytogeneticist. Por lo general respondemos a los mensajes de MyChart en el transcurso de 1 a 2 das hbiles.  Para renovar recetas, por favor pida a su farmacia que se ponga en contacto con nuestra oficina. Franz Jacks de fax es [place] [phone].  Si tiene un asunto urgente cuando la clnica est cerrada y que no puede esperar hasta el siguiente da hbil, puede llamar/localizar a su doctor(a) al nmero que aparece a continuacin.   Por favor, tenga en cuenta que aunque hacemos todo lo posible para estar disponibles para asuntos urgentes fuera del horario de [place], no estamos disponibles las 24 horas del da, los 7 [address]  [address] de la [place].   Si tiene un problema urgente y no puede comunicarse con nosotros, puede optar por buscar atencin mdica  en el consultorio de su doctor(a), en una clnica privada, en un centro de atencin urgente o en una sala de emergencias.  Si tiene Engineer, drilling, por favor llame inmediatamente al 911 o vaya a la sala de emergencias.  Nmeros de bper  - Dr. Bary Likes: [phone]  - Dra. Annette Barters:  [phone]  - Dr. Felipe Horton: [phone]   En caso de inclemencias del tiempo, por favor llame a Lajuan Pila principal al [phone] para una actualizacin sobre el [place] de cualquier retraso o cierre.  Consejos para la medicacin en dermatologa: Por favor, guarde las cajas en las que vienen los medicamentos de uso tpico para ayudarle a seguir las instrucciones sobre dnde y cmo usarlos. Las farmacias generalmente imprimen las instrucciones del medicamento slo en las cajas y no directamente en los tubos del [place].   Si su medicamento es muy caro, por favor, pngase en contacto con Bettyjane Brunet llamando al [phone] y presione la opcin 4 o envenos un mensaje a travs de Clinical cytogeneticist.   No podemos decirle cul ser su copago por los medicamentos por adelantado ya que esto es diferente dependiendo de la cobertura de su seguro.  Sin embargo, es posible que podamos encontrar un medicamento sustituto a Audiological scientist un formulario para que el seguro cubra el medicamento que se considera necesario.   Si se requiere una autorizacin previa para que su compaa de seguros [place] su medicamento, por favor permtanos de 1 a 2 das hbiles para completar este proceso.  Los precios de los medicamentos varan con frecuencia dependiendo del Environmental consultant de dnde se surte la receta y alguna farmacias pueden ofrecer precios ms baratos.  El sitio web www.goodrx.com tiene cupones para medicamentos de Health and safety inspector. Los precios aqu no tienen en cuenta lo que podra costar con la ayuda del seguro (puede ser ms barato con su seguro), pero el sitio web puede darle el precio si no utiliz Tourist information centre manager.  - Puede imprimir el cupn correspondiente y llevarlo con su receta a la farmacia.  - Tambin puede pasar por nuestra oficina durante el horario de atencin regular y Education officer, museum una tarjeta de cupones de GoodRx.  - Si necesita que su receta se enve electrnicamente a una farmacia diferente, informe  a nuestra oficina a travs de MyChart de [place] o por telfono llamando al [phone] y presione la opcin 4.

## 2024-05-01 NOTE — Progress Notes (Signed)
 Follow-Up Visit   Subjective  Laura Rojas is a 78 y.o. female who presents for the following: patient here concerning a rash that started Monday. States had been clipping rose bushes that day and her husband washed some towels with new detergent she used later in the afternoon. She had tried cortisone cream with no relief. Seen by NP yesterday and was given Triamcinolone  acetonide (kenalog -40) injection and prednisone  taper to use.   Itchy rash is on neck, trunk, and extends to knees.   The patient has spots, moles and lesions to be evaluated, some may be new or changing and the patient may have concern these could be cancer.  The following portions of the chart were reviewed this encounter and updated as appropriate: medications, allergies, medical history  Review of Systems:  No other skin or systemic complaints except as noted in HPI or Assessment and Plan.  Objective  Well appearing patient in no apparent distress; mood and affect are within normal limits.   A focused examination was performed of the following areas: Trunk , abdomen , back, arms, legs, neck,   Relevant exam findings are noted in the Assessment and Plan.  trunk and extremities See photos    Assessment & Plan   HIVES trunk and extremities Urticaria (Hives) of undetermined etiology.  No new meds or other triggers by history. Advised that 70% of the time, no cause for hives is found. May do lab after effects of steroids wears off in 1 month if still problems with hives.  Urticaria or hives is a pink to red patchy whelp- like rash of the skin that typically itches and it is the result of histamine release in the skin.   Hives may have multiple causes including stress, medications, infections, and systemic illness.  Sometimes there is a family history of chronic urticaria.   "Physical urticarias" may be caused by heat, sun, cold, vibration.   Insect bites can cause "papular urticaria". It is often difficult  to find the cause of generalized hives.  Statistically, 70% of the time a cause of generalized hives is not found.  Sometimes hives can spontaneously resolve. Other times hives can persist and when it does, and no cause is found, and it has been at least 6 weeks since started, it is called "chronic idiopathic urticaria".  Photos today  Treatment: Pt had IM steroid injection at PCP office with NP Continue Prednisone  taper as prescribed by NP  Recommend (reviewed with patient in office and given instructions in printout) For Hives (urticaria); dermatographism or Itch: Start non sedating antihistamine (either Allegra 180mg , or Claritin 10mg , or Zyrtec 10mg ) daily.  All these are non-prescription ("Over the Counter").   Start out with 1 pill a day.   After a week if not improving may increase to 2 pills a day.   After another week if not improving may increase to 3 pills a day.   After another week if still not improving may take up to 4 pills a day. Stay at highest dose that keeps condition controlled, but only up to 4 pills a day. Stay at the controlling dose for at least 2 weeks. Contact office if taking 4 pills of antihistamine a day for at least 2 weeks without control of condition as other options may be available.  Patient advised to send mychart next Wednesday, in 6 days,  and let us  know how she is doing and what she is using. If not improving will add to next  Thursday's. schedule.  Otherwise will recheck at 1 month follow up  May consider other treatments such as Dupixent or Xolair in future   Return for 1 month follow up on hives .  IRandee Busing, CMA, am acting as scribe for Celine Collard, MD.   Documentation: I have reviewed the above documentation for accuracy and completeness, and I agree with the above.  Celine Collard, MD

## 2024-05-22 DIAGNOSIS — M0609 Rheumatoid arthritis without rheumatoid factor, multiple sites: Secondary | ICD-10-CM | POA: Diagnosis not present

## 2024-05-26 ENCOUNTER — Other Ambulatory Visit: Payer: Self-pay | Admitting: Nurse Practitioner

## 2024-05-27 NOTE — Telephone Encounter (Signed)
 Requested medications are due for refill today.  Yes  Requested medications are on the active medications list.  yes  Last refill. 02/18/2024 #180 0 rf  Future visit scheduled.   yes  Notes to clinic.  Refill not delegated. Pt does have an upcoming rx that starts 02/02/2025 #180 0 rf.    Requested Prescriptions  Pending Prescriptions Disp Refills   LORazepam  (ATIVAN ) 0.5 MG tablet [Pharmacy Med Name: LORAZEPAM  0.5 MG TABLET] 180 tablet 0    Sig: TAKE 1 TAB BY MOUTH TWICE A DAY (USE THE ADDITIONAL 30 TABS ONLY SPARINGLY & ONLY FOR SEVERE ANXIETY)     Not Delegated - Psychiatry: Anxiolytics/Hypnotics 2 Failed - 05/27/2024  5:46 PM      Failed - This refill cannot be delegated      Passed - Urine Drug Screen completed in last 360 days      Passed - Patient is not pregnant      Passed - Valid encounter within last 6 months    Recent Outpatient Visits           3 weeks ago Allergic reaction, initial encounter   [facility] Melvin Pao, NP   2 months ago Type 2 diabetes mellitus with hyperglycemia, with long-term current use of insulin  [facility])   [facility] Valerio Melanie DASEN, NP       Future Appointments             In 2 weeks Hester Alm BROCKS, MD [facility]   In 8 months Hester Alm BROCKS, MD [facility]

## 2024-06-08 ENCOUNTER — Other Ambulatory Visit: Payer: Self-pay | Admitting: Nurse Practitioner

## 2024-06-10 ENCOUNTER — Ambulatory Visit (INDEPENDENT_AMBULATORY_CARE_PROVIDER_SITE_OTHER): Admitting: Dermatology

## 2024-06-10 ENCOUNTER — Encounter: Payer: Self-pay | Admitting: Dermatology

## 2024-06-10 DIAGNOSIS — Z09 Encounter for follow-up examination after completed treatment for conditions other than malignant neoplasm: Secondary | ICD-10-CM

## 2024-06-10 DIAGNOSIS — Z872 Personal history of diseases of the skin and subcutaneous tissue: Secondary | ICD-10-CM | POA: Diagnosis not present

## 2024-06-10 DIAGNOSIS — L509 Urticaria, unspecified: Secondary | ICD-10-CM

## 2024-06-10 DIAGNOSIS — Z7189 Other specified counseling: Secondary | ICD-10-CM

## 2024-06-10 NOTE — Progress Notes (Signed)
   Follow-Up Visit   Subjective  Laura Rojas is a 78 y.o. female who presents for the following: Urticaria 58m f/u, cleared up 1wk after last visit per patient, not taking any antihistamines, pt finished Oral prednisone  taper as prescribed by PCP and had also had IM Kenalog  injection before she came to 05/01/24 visit  The following portions of the chart were reviewed this encounter and updated as appropriate: medications, allergies, medical history  Review of Systems:  No other skin or systemic complaints except as noted in HPI or Assessment and Plan.  Objective  Well appearing patient in no apparent distress; mood and affect are within normal limits.    A focused examination was performed of the following areas: Face, trunk, arms  Relevant exam findings are noted in the Assessment and Plan.    Assessment & Plan   HISTORY of URTICARIA Resolved Exam: clear today Chronic condition with duration or expected duration over one year. Currently well-controlled. Urticaria or hives is a pink to red patchy whelp- like rash of the skin that typically itches and it is the result of histamine release in the skin.   Hives may have multiple causes including stress, medications, infections, and systemic illness.  Sometimes there is a family history of chronic urticaria.   Physical urticarias may be caused by pressure (dermatographism), heat, sun, cold, vibration.  Insect bites can cause papular urticaria. It is often difficult to find the cause of generalized hives.  Statistically, 70% of the time a cause of generalized hives is not found.  Sometimes hives can spontaneously resolve. Other times hives can persist and when it does, and no cause is found, and it has been at least 6 weeks since started, it is called chronic idiopathic urticaria. Antihistamines are the mainstay for treatment.  In severe cases Xolair injections may be used.   Treatment Plan: Resolved, no treatment at this  time   Return if symptoms worsen or fail to improve.  I, Grayce Saunas, RMA, am acting as scribe for Alm Rhyme, MD .   Documentation: I have reviewed the above documentation for accuracy and completeness, and I agree with the above.  Alm Rhyme, MD

## 2024-06-10 NOTE — Telephone Encounter (Signed)
 Requested medication (s) are due for refill today - expired Rx  Requested medication (s) are on the active medication list -yes  Future visit scheduled -no  Last refill: 04/24/23 #60 [id number]  Notes to clinic: fails lab protocol- over 1 year-03/28/22  Requested Prescriptions  Pending Prescriptions Disp Refills   VITRON-C 65-125 MG TABS [Pharmacy Med Name: VITRON-C TABLET] 60 tablet 7    Sig: TAKE 1 TABLET BY MOUTH EVERY DAY     Endocrinology:  Vitamins - ascorbic acid / iron  carbonyl Failed - 06/10/2024  2:24 PM      Failed - RBC in normal range and within 360 days    RBC  Date Value Ref Range Status  10/16/2022 4.47 3.77 - 5.28 x10E6/uL Final         Failed - HCT in normal range and within 360 days    Hematocrit  Date Value Ref Range Status  10/16/2022 41.2 34.0 - 46.6 % Final         Failed - HGB in normal range and within 360 days    Hemoglobin  Date Value Ref Range Status  10/16/2022 14.2 11.1 - 15.9 g/dL Final         Failed - Ferritin in normal range and within 360 days    Ferritin  Date Value Ref Range Status  03/28/2022 39 15 - 150 ng/mL Final         Failed - Fe (serum) in normal range and within 360 days    Iron   Date Value Ref Range Status  03/28/2022 60 27 - 139 ug/dL Final   Iron  Saturation  Date Value Ref Range Status  03/28/2022 18 15 - 55 % Final         Passed - Valid encounter within last 12 months    Recent Outpatient Visits           1 month ago Allergic reaction, initial encounter   [place] [facility] Melvin Pao, NP   2 months ago Type 2 diabetes mellitus with hyperglycemia, with long-term current use of insulin  (HCC)   [place] [place], Melanie T, NP       Future Appointments             Today Hester Alm BROCKS, MD [facility]   In 8 months Hester Alm BROCKS, MD  [facility]               Requested Prescriptions  Pending Prescriptions  Disp Refills   VITRON-C 65-125 MG TABS [Pharmacy Med Name: VITRON-C TABLET] 60 tablet 7    Sig: TAKE 1 TABLET BY MOUTH EVERY DAY     Endocrinology:  Vitamins - ascorbic acid / iron  carbonyl Failed - 06/10/2024  2:24 PM      Failed - RBC in normal range and within 360 days    RBC  Date Value Ref Range Status  10/16/2022 4.47 3.77 - 5.28 x10E6/uL Final         Failed - HCT in normal range and within 360 days    Hematocrit  Date Value Ref Range Status  10/16/2022 41.2 34.0 - 46.6 % Final         Failed - HGB in normal range and within 360 days    Hemoglobin  Date Value Ref Range Status  10/16/2022 14.2 11.1 - 15.9 g/dL Final         Failed - Ferritin in normal range and within  360 days    Ferritin  Date Value Ref Range Status  03/28/2022 39 15 - 150 ng/mL Final         Failed - Fe (serum) in normal range and within 360 days    Iron   Date Value Ref Range Status  03/28/2022 60 27 - 139 ug/dL Final   Iron  Saturation  Date Value Ref Range Status  03/28/2022 18 15 - 55 % Final         Passed - Valid encounter within last 12 months    Recent Outpatient Visits           1 month ago Allergic reaction, initial encounter   [facility] [facility] Melvin Pao, NP   2 months ago Type 2 diabetes mellitus with hyperglycemia, with long-term current use of insulin  [facility])   [place] [facility] [place], Melanie DASEN, NP       Future Appointments             Today Hester Alm BROCKS, MD [facility]   In 8 months Hester Alm BROCKS, MD [facility]

## 2024-06-10 NOTE — Patient Instructions (Signed)

## 2024-06-19 DIAGNOSIS — M0609 Rheumatoid arthritis without rheumatoid factor, multiple sites: Secondary | ICD-10-CM | POA: Diagnosis not present

## 2024-06-27 NOTE — Patient Instructions (Signed)
Be Involved in Caring For Your Health:  Taking Medications When medications are taken as directed, they can greatly improve your health. But if they are not taken as prescribed, they may not work. In some cases, not taking them correctly can be harmful. To help ensure your treatment remains effective and safe, understand your medications and how to take them. Bring your medications to each visit for review by your provider.  Your lab results, notes, and after visit summary will be available on My Chart. We strongly encourage you to use this feature. If lab results are abnormal the clinic will contact you with the appropriate steps. If the clinic does not contact you assume the results are satisfactory. You can always view your results on My Chart. If you have questions regarding your health or results, please contact the clinic during office hours. You can also ask questions on My Chart.  We at [facility] are grateful that you chose [place] to provide your care. We strive to provide evidence-based and compassionate care and are always looking for feedback. If you get a survey from the clinic please complete this so we can hear your opinions.  Diabetes Mellitus and Exercise Regular exercise is important for your health, especially if you have diabetes mellitus. Exercise is not just about losing weight. It can also help you increase muscle strength and bone density and reduce body fat and stress. This can help your level of endurance and make you more fit and flexible. Why should I exercise if I have diabetes? Exercise has many benefits for people with diabetes. It can: Help lower and control your blood sugar (glucose). Help your body respond better and become more sensitive to the hormone insulin. Reduce how much insulin your body needs. Lower your risk for heart disease by: Lowering how much "bad" cholesterol and triglycerides you have in your body. Increasing how much "good" cholesterol  you have in your body. Lowering your blood pressure. Lowering your blood glucose levels. What is my activity plan? Your health care provider or an expert trained in diabetes care (certified diabetes educator) can help you make an activity plan. This plan can help you find the type of exercise that works for you. It may also tell you how often to exercise and for how long. Be sure to: Get at least 150 minutes of medium-intensity or high-intensity exercise each week. This may involve brisk walking, biking, or water aerobics. Do stretching and strengthening exercises at least 2 times a week. This may involve yoga or weight lifting. Spread out your activity over at least 3 days of the week. Get some form of physical activity each day. Do not go more than 2 days in a row without some kind of activity. Avoid being inactive for more than 30 minutes at a time. Take frequent breaks to walk or stretch. Choose activities that you enjoy. Set goals that you know you can accomplish. Start slowly and increase the intensity of your exercise over time. How do I manage my diabetes during exercise?  Monitor your blood glucose Check your blood glucose before and after you exercise. If your blood glucose is 240 mg/dL (40.9 mmol/L) or higher before you exercise, check your urine for ketones. These are chemicals created by the liver. If you have ketones in your urine, do not exercise until your blood glucose returns to normal. If your blood glucose is 100 mg/dL (5.6 mmol/L) or lower, eat a snack that has 15-20 grams of carbohydrate in  it. Check your blood glucose 15 minutes after the snack to make sure that your level is above 100 mg/dL (5.6 mmol/L) before you start to exercise. Your risk for low blood glucose (hypoglycemia) goes up during and after exercise. Know the symptoms of this condition and how to treat it. Follow these instructions at home: Keep a carbohydrate snack on hand for use before, during, and after  exercise. This can help prevent or treat hypoglycemia. Avoid injecting insulin into parts of your body that are going to be used during exercise. This may include: Your arms, when you are going to play tennis. Your legs, when you are about to go jogging. Keep track of your exercise habits. This can help you and your health care provider watch and adjust your activity plan. Write down: What you eat before and after you exercise. Blood glucose levels before and after you exercise. The type and amount of exercise you do. Talk to your health care provider before you start a new activity. They may need to: Make sure that the activity is safe for you. Adjust your insulin, other medicines, and food that you eat. Drink water while you exercise. This can stop you from losing too much water (dehydration). It can also prevent problems caused by having a lot of heat in your body (heat stroke). Where to find more information American Diabetes Association: diabetes.org Association of Diabetes Care & Education Specialists: diabeteseducator.org This information is not intended to replace advice given to you by your health care provider. Make sure you discuss any questions you have with your health care provider. Document Revised: 05/03/2022 Document Reviewed: 05/03/2022 Elsevier Patient Education  2024 [employer].

## 2024-07-01 ENCOUNTER — Encounter: Payer: Self-pay | Admitting: Nurse Practitioner

## 2024-07-01 ENCOUNTER — Ambulatory Visit: Admitting: Nurse Practitioner

## 2024-07-01 VITALS — BP 120/72 | HR 74 | Temp 97.8°F | Ht 65.0 in | Wt 156.0 lb

## 2024-07-01 DIAGNOSIS — E1165 Type 2 diabetes mellitus with hyperglycemia: Secondary | ICD-10-CM | POA: Diagnosis not present

## 2024-07-01 DIAGNOSIS — F339 Major depressive disorder, recurrent, unspecified: Secondary | ICD-10-CM | POA: Diagnosis not present

## 2024-07-01 DIAGNOSIS — D508 Other iron deficiency anemias: Secondary | ICD-10-CM

## 2024-07-01 DIAGNOSIS — G2581 Restless legs syndrome: Secondary | ICD-10-CM

## 2024-07-01 DIAGNOSIS — E1169 Type 2 diabetes mellitus with other specified complication: Secondary | ICD-10-CM

## 2024-07-01 DIAGNOSIS — Z79899 Other long term (current) drug therapy: Secondary | ICD-10-CM | POA: Diagnosis not present

## 2024-07-01 DIAGNOSIS — I152 Hypertension secondary to endocrine disorders: Secondary | ICD-10-CM | POA: Diagnosis not present

## 2024-07-01 DIAGNOSIS — N183 Chronic kidney disease, stage 3 unspecified: Secondary | ICD-10-CM

## 2024-07-01 DIAGNOSIS — M353 Polymyalgia rheumatica: Secondary | ICD-10-CM

## 2024-07-01 DIAGNOSIS — K7460 Unspecified cirrhosis of liver: Secondary | ICD-10-CM

## 2024-07-01 DIAGNOSIS — E785 Hyperlipidemia, unspecified: Secondary | ICD-10-CM | POA: Diagnosis not present

## 2024-07-01 DIAGNOSIS — E1122 Type 2 diabetes mellitus with diabetic chronic kidney disease: Secondary | ICD-10-CM | POA: Diagnosis not present

## 2024-07-01 DIAGNOSIS — E1159 Type 2 diabetes mellitus with other circulatory complications: Secondary | ICD-10-CM

## 2024-07-01 DIAGNOSIS — F413 Other mixed anxiety disorders: Secondary | ICD-10-CM

## 2024-07-01 DIAGNOSIS — Z794 Long term (current) use of insulin: Secondary | ICD-10-CM

## 2024-07-01 LAB — BAYER DCA HB A1C WAIVED: HB A1C (BAYER DCA - WAIVED): 7.5 % — ABNORMAL HIGH (ref 4.8–5.6)

## 2024-07-01 NOTE — Assessment & Plan Note (Signed)
Chronic, ongoing.  Continue collaboration with rheumatology and current medication regimen.  Recent notes reviewed. 

## 2024-07-01 NOTE — Assessment & Plan Note (Signed)
 Ongoing, continue daily supplement as is offering benefit at this time. H/H stable. Recheck levels today.

## 2024-07-01 NOTE — Assessment & Plan Note (Signed)
 Chronic, stable Stage 3a, although recent labs have shown improvement.  Continue Losartan  for kidney protection.  Urine ALB 80 January 2025. Continue collaboration with cardiology.  Consider nephrology referral if worsening function.  Labs: CBC, CMP.

## 2024-07-01 NOTE — Assessment & Plan Note (Signed)
Chronic, ongoing.  Continue current medication regimen and adjust as needed.  Lipid panel today.  Discussed with patient, if myalgias with Atorvastatin, + her history of myalgia with statins, could consider Repatha initiation if poor tolerance.

## 2024-07-01 NOTE — Assessment & Plan Note (Signed)
Refer to diabetes with hyperglycemia plan of care.

## 2024-07-01 NOTE — Assessment & Plan Note (Signed)
 Chronic, ongoing.  Denies SI/HI.  Continue Sertraline  and Ativan .  Has had multiple attempts at reduction of benzo with no success.  Discussed at length risks with her and she is aware, wishes to continue current treatment.  Continue Ativan  and fill upon request.  PDMP reviewed.  Return in 3 months, UDS due next 07/01/25 and contract on file.

## 2024-07-01 NOTE — Assessment & Plan Note (Signed)
 Chronic, ongoing, followed by endocrinology in past.  A1c 7.5% today, at goal for age per geriatric society, <8%. Her previous levels in 9-10 range on average, including with endocrinology.  She wishes to avoid return to endo at this time as her previous provider left.  Will continue Basaglar  and adjust further as needed based on A1c result + continue sliding scale and Metformin  (highly recommend she restart this and if diarrhea presents to alert PCP).  Continue collaboration with [facility] as needed. Bring Libre to all visits.  Return to office in 3 months -- goal to avoid hypoglycemia.  If consistent poor control will get back into endo.   - Eye and foot exam up to date - Statin on board.  ARB on board. - Vaccinations up to date.

## 2024-07-01 NOTE — Assessment & Plan Note (Signed)
 Chronic, ongoing for insomnia & anxiety.  Has had trials of other medications and reductions with poor outcomes.  Fully aware of risks of chronic benzo use and aware this will be discussed each visit.  UDS obtained today (due next 07/01/25) and contract on file.

## 2024-07-01 NOTE — Assessment & Plan Note (Signed)
 Chronic, stable.  BP at goal for age.  Continue current medication regimen and collaboration with cardiology. Labs: CBC, CMP.  Recommend she monitor BP at least three days a week at home and document for providers + bring to visits.  DASH diet focus.  Return in 3 months.

## 2024-07-01 NOTE — Assessment & Plan Note (Signed)
 Chronic, ongoing.  Denies SI/HI.  Continue Sertraline  150 MG daily as is ordered. This is offering benefit to mood overall and may help reduce benzo use.  Consider increase to 200 MG in future for increased benefit, she does not want to change at this time.  Return in 3 months.

## 2024-07-01 NOTE — Assessment & Plan Note (Signed)
Chronic, stable with Gabapentin, continue this regimen and adjust as needed. Renal dose as needed. 

## 2024-07-01 NOTE — Progress Notes (Signed)
 BP 120/72   Pulse 74   Temp 97.8 F (36.6 C) (Oral)   Ht 5' 5 (1.651 m)   Wt 156 lb (70.8 kg)   SpO2 98%   BMI 25.96 kg/m    Subjective:    Patient ID: Laura Rojas, female    DOB: 05/06/1946, 78 y.o.   MRN: [id number]  HPI: Laura Rojas is a 78 y.o. female  Chief Complaint  Patient presents with   Chronic Kidney Disease   Depression   Diabetes   Hyperlipidemia   Hypertension   DIABETE A1c was 7.5% in April.  Continues Basaglar , Novolog  sliding scale, and Metformin . Did not bring Freestyle today.  On average sugars have fluctuated, especially when taking steroids for rash recently.  Stopped taking Metformin  one month ago due to episode of diarrhea, this improved after stopping.  History: Saw endocrinology in past,last 05/16/21, after her provider there left she wanted to transition to PCP.  Januvia  in past but due to pancreatitis and elevation in Lipase and Amylase we stopped this.  Last saw GI on 04/17/24 for cirrhosis and past pancreatitis.   Did not tolerate GLP1 (Trulicity) in past and had side effects with Farxiga .   Hypoglycemic episodes: had some lows Polydipsia/polyuria: no Visual disturbance: no Chest pain: no Paresthesias: no Glucose Monitoring: yes             Accucheck frequency: forgot to bring monitor             Fasting glucose:              Post prandial:             Evening:              Before meals: Taking Insulin ?: yes             Long acting insulin : 22 units on average             Short acting insulin : sliding scale units with meals (16 units) Blood Pressure Monitoring: rarely Retinal Examination: Up to Date -- [place] [facility] Exam: Up to Date Pneumovax: Up to Date Influenza: Up to Date Aspirin: yes   HYPERTENSION / HYPERLIPIDEMIA/CAD Taking Amlodipine , Plavix , HCTZ, Fish oil, Losartan , Lipitor. Follows with Dr. Francyne, last visit was 01/23/24 and is to return annually: subclavian artery stenosis and angina.  Satisfied with current  treatment? yes Duration of hypertension: chronic BP monitoring frequency: not checking BP range:  BP medication side effects: no Duration of hyperlipidemia: chronic Cholesterol medication side effects: no Cholesterol supplements: fish oil Medication compliance: good compliance Aspirin: yes Recent stressors: no Recurrent headaches: no Visual changes: no Palpitations: no Dyspnea: no Chest pain: no Lower extremity edema: no Dizzy/lightheaded: no   CHRONIC KIDNEY DISEASE (CKD 3a) Taking iron  supplement daily for past low levels and B12 every other day. CKD status: stable Medications renally dose: yes Previous renal evaluation: no Pneumovax:  Up to Date Influenza Vaccine: Up to Date    POLYMYALGIA RHEUMATICA & RLS Rheumatology last 06/19/24, treated with Cimzia  injections. This regimen has helped her be pain free for awhile now.  Duration: chronic Involved joints: right hand, both feet, and left knee Mechanism of injury: arthritis Severity: 4/10 today Quality:  dull, aching  Frequency: intermittent  Radiation: no Aggravating factors: walking and movement  Alleviating factors: Aspercreme and APAP  Status: stable Treatments attempted: Tylenol  and Aspercreme Relief with NSAIDs?: No NSAIDs Taken Weakness with weight bearing or walking: no Sensation of giving way:  no  ANXIETY/STRESS Continues to take Zoloft  150 MG daily.  Continues Ativan  0.5 MG, takes this at night only, although ordered BID.  Sometimes takes two every night (1 MG), started by previous PCP. Pt aware of risks of benzo medication use to include increased sedation, respiratory suppression, falls, dependence and cardiovascular events.  Pt would like to continue treatment as benefit determined to outweigh risk. On review PDMP last Ativan  fill 05/29/24 for 3 months supply, previous PCP ordered this way. Duration:stable Anxious mood: occasional Excessive worrying: no Irritability: no Sweating: no Nausea:  no Palpitations:no Hyperventilation: no Panic attacks: no Agoraphobia: no  Obscessions/compulsions: no Depressed mood: no    07/01/2024    3:42 PM 04/30/2024   11:27 AM 03/19/2024    8:30 AM 10/16/2023    3:18 PM 09/11/2023   10:49 AM  Depression screen PHQ 2/9  Decreased Interest 0 0 0 0 0  Down, Depressed, Hopeless 0 0 0 0 0  PHQ - 2 Score 0 0 0 0 0  Altered sleeping 0 1 0 0 1  Tired, decreased energy 1 1 1 1 1   Change in appetite 1 1 2  0 1  Feeling bad or failure about yourself  0 0 0 0 0  Trouble concentrating 0 0 0 0 0  Moving slowly or fidgety/restless 0 0 0 0 0  Suicidal thoughts 0 0 0 0 0  PHQ-9 Score 2 3 3 1 3   Difficult doing work/chores Not difficult at all Not difficult at all Not difficult at all Not difficult at all Not difficult at all      07/01/2024    3:42 PM 04/30/2024   11:27 AM 03/19/2024    8:31 AM 09/11/2023   10:49 AM  GAD 7 : Generalized Anxiety Score  Nervous, Anxious, on Edge 0 0 0 0  Control/stop worrying 0 0 0 0  Worry too much - different things 0 0 0 0  Trouble relaxing 0 0 0 0  Restless 0 0 0 0  Easily annoyed or irritable 1 1 0 1  Afraid - awful might happen 0 0 0 0  Total GAD 7 Score 1 1 0 1  Anxiety Difficulty Not difficult at all  Not difficult at all Not difficult at all      Relevant past medical, surgical, family and social history reviewed and updated as indicated. Interim medical history since our last visit reviewed. Allergies and medications reviewed and updated.  Review of Systems  Constitutional:  Negative for activity change, appetite change, diaphoresis, fatigue and fever.  Respiratory:  Negative for cough, chest tightness and shortness of breath.   Cardiovascular:  Negative for chest pain, palpitations and leg swelling.  Gastrointestinal: Negative.   Endocrine: Negative for cold intolerance, heat intolerance, polydipsia, polyphagia and polyuria.  Neurological:  Negative for dizziness, syncope, weakness, light-headedness,  numbness and headaches.  Psychiatric/Behavioral: Negative.      Per HPI unless specifically indicated above     Objective:    BP 120/72   Pulse 74   Temp 97.8 F (36.6 C) (Oral)   Ht 5' 5 (1.651 m)   Wt 156 lb (70.8 kg)   SpO2 98%   BMI 25.96 kg/m   Wt Readings from Last 3 Encounters:  07/01/24 156 lb (70.8 kg)  04/30/24 154 lb 12.8 oz (70.2 kg)  03/19/24 156 lb 6.4 oz (70.9 kg)    Physical Exam Vitals and nursing note reviewed.  Constitutional:      General: She is  awake. She is not in acute distress.    Appearance: She is well-developed, well-groomed and overweight. She is not ill-appearing.  HENT:     Head: Normocephalic.     Right Ear: Hearing normal.     Left Ear: Hearing normal.  Eyes:     General: Lids are normal.        Right eye: No discharge.        Left eye: No discharge.     Conjunctiva/sclera: Conjunctivae normal.     Pupils: Pupils are equal, round, and reactive to light.  Neck:     Thyroid : No thyromegaly.     Vascular: Carotid bruit (bilateral) present.  Cardiovascular:     Rate and Rhythm: Normal rate and regular rhythm.     Heart sounds: Normal heart sounds. No murmur heard.    No gallop.  Pulmonary:     Effort: Pulmonary effort is normal. No accessory muscle usage or respiratory distress.     Breath sounds: No wheezing or rhonchi.  Abdominal:     General: Bowel sounds are normal.     Palpations: Abdomen is soft.  Musculoskeletal:     Cervical back: Normal range of motion and neck supple.     Right lower leg: No edema.     Left lower leg: No edema.  Skin:    General: Skin is warm and dry.  Neurological:     Mental Status: She is alert and oriented to person, place, and time.  Psychiatric:        Attention and Perception: Attention normal.        Mood and Affect: Mood normal.        Speech: Speech normal.        Behavior: Behavior normal. Behavior is cooperative.        Thought Content: Thought content normal.    Results for orders  placed or performed in visit on 07/01/24  Bayer DCA Hb A1c Waived   Collection Time: 07/01/24  3:29 PM  Result Value Ref Range   HB A1C (BAYER DCA - WAIVED) 7.5 (H) 4.8 - 5.6 %      Assessment & Plan:   Problem List Items Addressed This Visit       Cardiovascular and Mediastinum   Hypertension associated with diabetes (HCC)   Chronic, stable.  BP at goal for age.  Continue current medication regimen and collaboration with cardiology. Labs: CBC, CMP.  Recommend she monitor BP at least three days a week at home and document for providers + bring to visits.  DASH diet focus.  Return in 3 months.      Relevant Orders   Bayer DCA Hb A1c Waived (Completed)   Comprehensive metabolic panel with GFR     Digestive   Cirrhosis, non-alcoholic (HCC)   Ongoing, will continue collaboration with GI at this time.  Remain off Januvia  and GLP1.  Recent GI notes reviewed.  Labs today.        Endocrine   Type 2 diabetes mellitus with hyperglycemia, with long-term current use of insulin  (HCC) - Primary   Chronic, ongoing, followed by endocrinology in past.  A1c 7.5% today, at goal for age per geriatric society, <8%. Her previous levels in 9-10 range on average, including with endocrinology.  She wishes to avoid return to endo at this time as her previous provider left.  Will continue Basaglar  and adjust further as needed based on A1c result + continue sliding scale and Metformin  (highly recommend she restart  this and if diarrhea presents to alert PCP).  Continue collaboration with [facility] as needed. Bring Libre to all visits.  Return to office in 3 months -- goal to avoid hypoglycemia.  If consistent poor control will get back into endo.   - Eye and foot exam up to date - Statin on board.  ARB on board. - Vaccinations up to date.      Relevant Orders   Bayer DCA Hb A1c Waived (Completed)   Hyperlipidemia associated with type 2 diabetes mellitus (HCC)   Chronic, ongoing.  Continue current  medication regimen and adjust as needed.  Lipid panel today.  Discussed with patient, if myalgias with Atorvastatin , + her history of myalgia with statins, could consider Repatha initiation if poor tolerance.      Relevant Orders   Bayer DCA Hb A1c Waived (Completed)   Comprehensive metabolic panel with GFR   Lipid Panel w/o Chol/HDL Ratio   CKD stage 3 due to type 2 diabetes mellitus (HCC)   Chronic, stable Stage 3a, although recent labs have shown improvement.  Continue Losartan  for kidney protection.  Urine ALB 80 January 2025. Continue collaboration with cardiology.  Consider nephrology referral if worsening function.  Labs: CBC, CMP.      Relevant Orders   Bayer DCA Hb A1c Waived (Completed)   Comprehensive metabolic panel with GFR     Other   Restless leg syndrome   Chronic, stable with Gabapentin , continue this regimen and adjust as needed. Renal dose as needed.      Polymyalgia rheumatica (HCC)   Chronic, ongoing.  Continue collaboration with rheumatology and current medication regimen.  Recent notes reviewed.      Long-term current use of benzodiazepine   Chronic, ongoing for insomnia & anxiety.  Has had trials of other medications and reductions with poor outcomes.  Fully aware of risks of chronic benzo use and aware this will be discussed each visit.  UDS obtained today (due next 07/01/25) and contract on file.      Relevant Orders   [id number] 11+Oxyco+Alc+Crt-Bund   Insulin  long-term use (HCC)   Refer to diabetes with hyperglycemia plan of care.      Relevant Orders   Bayer DCA Hb A1c Waived (Completed)   Fe deficiency anemia   Ongoing, continue daily supplement as is offering benefit at this time. H/H stable. Recheck levels today.      Relevant Orders   CBC with Differential/Platelet   Ferritin   Iron    Depression, recurrent (HCC)   Chronic, ongoing.  Denies SI/HI.  Continue Sertraline  150 MG daily as is ordered. This is offering benefit to mood overall and may  help reduce benzo use.  Consider increase to 200 MG in future for increased benefit, she does not want to change at this time.  Return in 3 months.      Relevant Orders   [id number] 11+Oxyco+Alc+Crt-Bund   Anxiety disorder   Chronic, ongoing.  Denies SI/HI.  Continue Sertraline  and Ativan .  Has had multiple attempts at reduction of benzo with no success.  Discussed at length risks with her and she is aware, wishes to continue current treatment.  Continue Ativan  and fill upon request.  PDMP reviewed.  Return in 3 months, UDS due next 07/01/25 and contract on file.        Follow up plan: Return in about 3 months (around 10/01/2024) for T2DM, HTN/HLD, MOOD, RA, ANXIETY.

## 2024-07-01 NOTE — Assessment & Plan Note (Signed)
 Ongoing, will continue collaboration with GI at this time.  Remain off Januvia  and GLP1.  Recent GI notes reviewed.  Labs today.

## 2024-07-02 ENCOUNTER — Ambulatory Visit: Payer: Self-pay | Admitting: Nurse Practitioner

## 2024-07-02 LAB — CBC WITH DIFFERENTIAL/PLATELET
Basophils Absolute: 0 x10E3/uL (ref 0.0–0.2)
Basos: 1 %
EOS (ABSOLUTE): 0.1 x10E3/uL (ref 0.0–0.4)
Eos: 2 %
Hematocrit: 41.6 % (ref 34.0–46.6)
Hemoglobin: 13.7 g/dL (ref 11.1–15.9)
Immature Grans (Abs): 0.1 x10E3/uL (ref 0.0–0.1)
Immature Granulocytes: 1 %
Lymphocytes Absolute: 1.6 x10E3/uL (ref 0.7–3.1)
Lymphs: 19 %
MCH: 31.6 pg (ref 26.6–33.0)
MCHC: 32.9 g/dL (ref 31.5–35.7)
MCV: 96 fL (ref 79–97)
Monocytes Absolute: 0.5 x10E3/uL (ref 0.1–0.9)
Monocytes: 6 %
Neutrophils Absolute: 6 x10E3/uL (ref 1.4–7.0)
Neutrophils: 71 %
Platelets: 230 x10E3/uL (ref 150–450)
RBC: 4.33 x10E6/uL (ref 3.77–5.28)
RDW: 13.3 % (ref 11.7–15.4)
WBC: 8.3 x10E3/uL (ref 3.4–10.8)

## 2024-07-02 LAB — COMPREHENSIVE METABOLIC PANEL WITH GFR
ALT: 22 IU/L (ref 0–32)
AST: 23 IU/L (ref 0–40)
Albumin: 4.5 g/dL (ref 3.8–4.8)
Alkaline Phosphatase: 124 IU/L — ABNORMAL HIGH (ref 44–121)
BUN/Creatinine Ratio: 18 (ref 12–28)
BUN: 20 mg/dL (ref 8–27)
Bilirubin Total: 0.4 mg/dL (ref 0.0–1.2)
CO2: 25 mmol/L (ref 20–29)
Calcium: 9.2 mg/dL (ref 8.7–10.3)
Chloride: 99 mmol/L (ref 96–106)
Creatinine, Ser: 1.09 mg/dL — ABNORMAL HIGH (ref 0.57–1.00)
Globulin, Total: 2.2 g/dL (ref 1.5–4.5)
Glucose: 316 mg/dL — ABNORMAL HIGH (ref 70–99)
Potassium: 4 mmol/L (ref 3.5–5.2)
Sodium: 137 mmol/L (ref 134–144)
Total Protein: 6.7 g/dL (ref 6.0–8.5)
eGFR: 52 mL/min/1.73 — ABNORMAL LOW (ref >59)

## 2024-07-02 LAB — LIPID PANEL W/O CHOL/HDL RATIO
Cholesterol, Total: 182 mg/dL (ref 100–199)
HDL: 64 mg/dL (ref >39)
LDL Chol Calc (NIH): 66 mg/dL (ref 0–99)
Triglycerides: 333 mg/dL — ABNORMAL HIGH (ref 0–149)
VLDL Cholesterol Cal: 52 mg/dL — ABNORMAL HIGH (ref 5–40)

## 2024-07-02 LAB — IRON: Iron: 87 ug/dL (ref 27–139)

## 2024-07-02 LAB — FERRITIN: Ferritin: 132 ng/mL (ref 15–150)

## 2024-07-02 NOTE — Progress Notes (Signed)
 Contacted via MyChart  Good afternoon Laura Rojas, your labs have returned and overall are stable with a couple exceptions: - Kidney function, creatinine and eGFR, has declined a little this check.  Ensure good water intake at home and reduce any salt intake. - Alkaline phosphatase remains a little elevated, but not worsening.  We can continue to monitor. - Triglycerides a bit more elevated this check, ensure healthy diet choices and continue all current medications.  Any questions? Keep being amazing!!  Thank you for allowing me to participate in your care.  I appreciate you. Kindest regards, Erie Radu

## 2024-07-03 LAB — DRUG SCREEN 764883 11+OXYCO+ALC+CRT-BUND

## 2024-07-15 DIAGNOSIS — M0609 Rheumatoid arthritis without rheumatoid factor, multiple sites: Secondary | ICD-10-CM | POA: Diagnosis not present

## 2024-07-15 DIAGNOSIS — M150 Primary generalized (osteo)arthritis: Secondary | ICD-10-CM | POA: Diagnosis not present

## 2024-07-15 DIAGNOSIS — Z79899 Other long term (current) drug therapy: Secondary | ICD-10-CM | POA: Diagnosis not present

## 2024-07-22 DIAGNOSIS — M0609 Rheumatoid arthritis without rheumatoid factor, multiple sites: Secondary | ICD-10-CM | POA: Diagnosis not present

## 2024-07-31 ENCOUNTER — Encounter: Payer: Self-pay | Admitting: Nurse Practitioner

## 2024-08-19 ENCOUNTER — Telehealth: Payer: Self-pay

## 2024-08-19 DIAGNOSIS — M0609 Rheumatoid arthritis without rheumatoid factor, multiple sites: Secondary | ICD-10-CM | POA: Diagnosis not present

## 2024-08-19 NOTE — Telephone Encounter (Signed)
 Returned call to patient. Will schedule her and her husband for both high dose.

## 2024-08-19 NOTE — Telephone Encounter (Unsigned)
 Copied from CRM #[id number]. Topic: Appointments - Appointment Scheduling >> Aug 19, 2024  3:21 PM Geneva B wrote: Patient/patient representative is calling to schedule an appointment. Refer to attachments for appointment information.  Patient is calling has questions about flu shot please call pt back  [phone] (H) [phone] [facility]

## 2024-08-20 ENCOUNTER — Ambulatory Visit

## 2024-08-25 ENCOUNTER — Ambulatory Visit (INDEPENDENT_AMBULATORY_CARE_PROVIDER_SITE_OTHER)

## 2024-08-25 DIAGNOSIS — Z23 Encounter for immunization: Secondary | ICD-10-CM | POA: Diagnosis not present

## 2024-08-25 NOTE — Progress Notes (Signed)
 Patient is in office today for a nurse visit for FLU VACCINE. Patient Injection was given in the  Left deltoid. Patient tolerated injection well.

## 2024-08-29 ENCOUNTER — Other Ambulatory Visit: Payer: Self-pay | Admitting: Nurse Practitioner

## 2024-09-01 NOTE — Telephone Encounter (Signed)
 Requested medication (s) are due for refill today: yes  Requested medication (s) are on the active medication list: yes  Last refill:  05/28/24  Future visit scheduled: yes  Notes to clinic:  Unable to refill per protocol, cannot delegate.      Requested Prescriptions  Pending Prescriptions Disp Refills   LORazepam  (ATIVAN ) 0.5 MG tablet [Pharmacy Med Name: LORAZEPAM  0.5 MG TABLET] 180 tablet 0    Sig: TAKE 1 TAB BY MOUTH TWICE A DAY (USE THE ADDITIONAL 30 TABS ONLY SPARINGLY & ONLY FOR SEVERE ANXIETY)     Not Delegated - Psychiatry: Anxiolytics/Hypnotics 2 Failed - 09/01/2024 12:32 PM      Failed - This refill cannot be delegated      Passed - Urine Drug Screen completed in last 360 days      Passed - Patient is not pregnant      Passed - Valid encounter within last 6 months    Recent Outpatient Visits           2 months ago Type 2 diabetes mellitus with hyperglycemia, with long-term current use of insulin  (HCC)   [place] [facility] [place], Donnellson T, NP   4 months ago Allergic reaction, initial encounter   [place] [facility] Melvin Pao, NP   5 months ago Type 2 diabetes mellitus with hyperglycemia, with long-term current use of insulin  [facility])   [place] [facility] [place] Melanie DASEN, NP       Future Appointments             In 5 months Hester Alm BROCKS, MD [facility]

## 2024-09-03 ENCOUNTER — Ambulatory Visit: Payer: Medicare Other | Admitting: Dermatology

## 2024-09-16 DIAGNOSIS — M0609 Rheumatoid arthritis without rheumatoid factor, multiple sites: Secondary | ICD-10-CM | POA: Diagnosis not present

## 2024-10-01 ENCOUNTER — Telehealth: Payer: Self-pay | Admitting: Nurse Practitioner

## 2024-10-01 NOTE — Telephone Encounter (Signed)
 Copied from CRM [id number]. Topic: Medicare AWV >> Oct 01, 2024  2:05 PM Nathanel DEL wrote: Reason for CRM: LVM 10/01/24 AWV appt changed from telephone/video to office due to Traditional Medicare/VA Ins.  All Medicare A/B/VA INS patient are to do office visits only. Please call to confirm   Nathanel Paschal; Care Guide Ambulatory Clinical Support Kyle l [facility] Direct Dial: [phone]

## 2024-10-03 ENCOUNTER — Other Ambulatory Visit: Payer: Self-pay | Admitting: Nurse Practitioner

## 2024-10-03 DIAGNOSIS — E1165 Type 2 diabetes mellitus with hyperglycemia: Secondary | ICD-10-CM

## 2024-10-05 NOTE — Patient Instructions (Signed)

## 2024-10-06 NOTE — Telephone Encounter (Signed)
 Requested Prescriptions  Pending Prescriptions Disp Refills   BD PEN NEEDLE NANO 2ND GEN 32G X 4 MM MISC [Pharmacy Med Name: BD NANO 2 GEN PEN NDL 32G 4MM] 400 each 0    Sig: USE 1 EACH 4 (FOUR) TIMES DAILY     Endocrinology: Diabetes - Testing Supplies Passed - 10/06/2024 10:51 AM      Passed - Valid encounter within last 12 months    Recent Outpatient Visits           3 months ago Type 2 diabetes mellitus with hyperglycemia, with long-term current use of insulin  (HCC)   [place] [facility] [place], Melanie T, NP   5 months ago Allergic reaction, initial encounter   [place] [facility] Melvin Pao, NP   6 months ago Type 2 diabetes mellitus with hyperglycemia, with long-term current use of insulin  [facility])   [place] [facility] Valerio Melanie DASEN, NP       Future Appointments             In 4 months Hester Alm BROCKS, MD [facility]

## 2024-10-07 ENCOUNTER — Ambulatory Visit (INDEPENDENT_AMBULATORY_CARE_PROVIDER_SITE_OTHER): Admitting: Nurse Practitioner

## 2024-10-07 ENCOUNTER — Encounter: Payer: Self-pay | Admitting: Nurse Practitioner

## 2024-10-07 VITALS — BP 129/74 | HR 64 | Temp 98.6°F | Resp 18 | Ht 65.0 in | Wt 156.2 lb

## 2024-10-07 DIAGNOSIS — F5101 Primary insomnia: Secondary | ICD-10-CM

## 2024-10-07 DIAGNOSIS — E1165 Type 2 diabetes mellitus with hyperglycemia: Secondary | ICD-10-CM | POA: Diagnosis not present

## 2024-10-07 DIAGNOSIS — E1169 Type 2 diabetes mellitus with other specified complication: Secondary | ICD-10-CM

## 2024-10-07 DIAGNOSIS — F411 Generalized anxiety disorder: Secondary | ICD-10-CM

## 2024-10-07 DIAGNOSIS — N1831 Chronic kidney disease, stage 3a: Secondary | ICD-10-CM | POA: Diagnosis not present

## 2024-10-07 DIAGNOSIS — E1122 Type 2 diabetes mellitus with diabetic chronic kidney disease: Secondary | ICD-10-CM | POA: Diagnosis not present

## 2024-10-07 DIAGNOSIS — M0609 Rheumatoid arthritis without rheumatoid factor, multiple sites: Secondary | ICD-10-CM

## 2024-10-07 DIAGNOSIS — I25708 Atherosclerosis of coronary artery bypass graft(s), unspecified, with other forms of angina pectoris: Secondary | ICD-10-CM | POA: Diagnosis not present

## 2024-10-07 DIAGNOSIS — Z794 Long term (current) use of insulin: Secondary | ICD-10-CM | POA: Diagnosis not present

## 2024-10-07 DIAGNOSIS — E1159 Type 2 diabetes mellitus with other circulatory complications: Secondary | ICD-10-CM | POA: Diagnosis not present

## 2024-10-07 DIAGNOSIS — Z79899 Other long term (current) drug therapy: Secondary | ICD-10-CM

## 2024-10-07 DIAGNOSIS — E785 Hyperlipidemia, unspecified: Secondary | ICD-10-CM | POA: Diagnosis not present

## 2024-10-07 DIAGNOSIS — I152 Hypertension secondary to endocrine disorders: Secondary | ICD-10-CM | POA: Diagnosis not present

## 2024-10-07 DIAGNOSIS — F339 Major depressive disorder, recurrent, unspecified: Secondary | ICD-10-CM

## 2024-10-07 DIAGNOSIS — K7460 Unspecified cirrhosis of liver: Secondary | ICD-10-CM | POA: Diagnosis not present

## 2024-10-07 DIAGNOSIS — N183 Chronic kidney disease, stage 3 unspecified: Secondary | ICD-10-CM | POA: Diagnosis not present

## 2024-10-07 LAB — BAYER DCA HB A1C WAIVED: HB A1C (BAYER DCA - WAIVED): 7 % — ABNORMAL HIGH (ref 4.8–5.6)

## 2024-10-07 MED ORDER — BD PEN NEEDLE NANO 2ND GEN 32G X 4 MM MISC: 3 refills | Status: AC

## 2024-10-07 NOTE — Assessment & Plan Note (Signed)
Chronic, ongoing.  Continue current medication regimen and adjust as needed.  Lipid panel today.  Discussed with patient, if myalgias with Atorvastatin, + her history of myalgia with statins, could consider Repatha initiation if poor tolerance.

## 2024-10-07 NOTE — Assessment & Plan Note (Signed)
 Chronic, ongoing, followed by endocrinology in past.  A1c 7% today, at goal for age per geriatric society, <8%. Her previous levels in 9-10 range on average, including with endocrinology.  She wishes to avoid return to endo at this time as her previous provider left.  Will continue Basaglar  and adjust further as needed based on A1c result + continue sliding scale only at this time. Has been off Metformin  for >3 months and A1c has trended down. Continue collaboration with [facility] as needed. Bring Libre to all visits. Return to office in 3 months -- goal to avoid hypoglycemia.  If consistent poor control will get back into endo. Have advised her if constant or increase low sugars to alert PCP and lower meal time insulin  to 14 units. - Eye and foot exam up to date - Statin on board.  ARB on board. - Vaccinations up to date.

## 2024-10-07 NOTE — Assessment & Plan Note (Signed)
 Ongoing, will continue collaboration with GI at this time.  Remain off Januvia  and GLP1.  Recent GI notes reviewed.  Labs today.

## 2024-10-07 NOTE — Assessment & Plan Note (Signed)
 Chronic, ongoing for insomnia & anxiety.  Has had trials of other medications and reductions with poor outcomes.  Fully aware of risks of chronic benzo use and aware this will be discussed each visit.  UDS due next 07/01/25 and contract on file.

## 2024-10-07 NOTE — Assessment & Plan Note (Signed)
Chronic, stable.  No recent chest pain.  Continue collaboration with cardiology and preventative treatment, including statin and ASA.

## 2024-10-07 NOTE — Assessment & Plan Note (Signed)
 Chronic, ongoing.  Denies SI/HI.  Continue Sertraline  and Ativan .  Has had multiple attempts at reduction of benzo with no success.  Discussed at length risks with her and she is aware, wishes to continue current treatment.  Continue Ativan  and fill upon request.  PDMP reviewed.  Return in 3 months, UDS due next 07/01/25 and contract on file.

## 2024-10-07 NOTE — Assessment & Plan Note (Signed)
Chronic, stable.  Followed by rheumatology.  Continue this collaboration.  Recent notes reviewed.  Has injections with them.

## 2024-10-07 NOTE — Assessment & Plan Note (Addendum)
 Chronic, ongoing with use of Lorazepam  nightly.  Has had trial reductions in past and other medications without success.  Is fully aware of risks of chronic benzo use and aware this will be discussed each visit.  Return in 3 months.  UDS due next 07/01/25, contract up to date.

## 2024-10-07 NOTE — Assessment & Plan Note (Signed)
 Chronic, stable Stage 3a, although recent labs have shown improvement.  Continue Losartan  for kidney protection.  Urine ALB 80 January 2025. Continue collaboration with cardiology.  Consider nephrology referral if worsening function.  Labs: BMP. Refer to diabetes with hyperglycemia for further.

## 2024-10-07 NOTE — Assessment & Plan Note (Signed)
 Chronic, ongoing.  Denies SI/HI.  Continue Sertraline  150 MG daily as is ordered. This is offering benefit to mood overall and may help reduce benzo use.  Consider increase to 200 MG in future for increased benefit, she does not want to change at this time.  Return in 3 months.

## 2024-10-07 NOTE — Assessment & Plan Note (Signed)
Refer to diabetes with hyperglycemia plan of care.

## 2024-10-07 NOTE — Assessment & Plan Note (Signed)
 Chronic, stable.  BP at goal for age.  Continue current medication regimen and collaboration with cardiology. Labs: BMP.  Recommend she monitor BP at least three days a week at home and document for providers + bring to visits.  DASH diet focus.  Return in 3 months.

## 2024-10-07 NOTE — Progress Notes (Signed)
 BP 129/74 (BP Location: Left Arm, Patient Position: Sitting, Cuff Size: Normal)   Pulse 64   Temp 98.6 F (37 C) (Oral)   Resp 18   Ht 5' 5 (1.651 m)   Wt 156 lb 3.2 oz (70.9 kg)   SpO2 98%   BMI 25.99 kg/m    Subjective:    Patient ID: Laura Rojas, female    DOB: 14-Jan-1946, 78 y.o.   MRN: [id number]  HPI: Laura Rojas is a 78 y.o. female  Chief Complaint  Patient presents with   Diabetes    Says it is up and down and likely numbers are not good. Unable to take Metformin .    Rheumatoid Arthritis    Doing well.    DIABETE Laura Rojas A1c was 7.5%. Continues Basaglar  and Novolog  sliding scale. Stopped taking Metformin  > 3 months ago due to constant episodes of diarrhea, this improved after stopping. Freestyle over past 90 days: Average glucose 172, In Target 59%, Above 40%, and Below 1%, low events 44. Last saw GI on 04/17/24 for cirrhosis and past pancreatitis.    History: Saw endocrinology in past,last 05/16/21, after her provider there left she wanted to transition to PCP.  Januvia  in past caused pancreatitis and elevation in Lipase and Amylase we stopped this. Did not tolerate GLP1 (Trulicity) in past and had side effects with Farxiga .   Hypoglycemic episodes: had some lows as noted above, she is aware to monitor closely Polydipsia/polyuria: no Visual disturbance: no Chest pain: no Paresthesias: no Glucose Monitoring: yes             Accucheck frequency: as above             Fasting glucose:              Post prandial:             Evening:              Before meals: Taking Insulin ?: yes             Long acting insulin : 12 units on average             Short acting insulin : sliding scale units with meals (16 to 22 units) Blood Pressure Monitoring: rarely Retinal Examination: Up to Date -- [place] [facility] Exam: Up to Date Pneumovax: Up to Date Influenza: Up to Date Aspirin: yes   HYPERTENSION / HYPERLIPIDEMIA/CAD Continues Amlodipine , Plavix , HCTZ, Fish oil,  Losartan , Lipitor. Saw Dr. Francyne with cardiology 01/23/24 and is to return annually: subclavian artery stenosis and angina.  Satisfied with current treatment? yes Duration of hypertension: chronic BP monitoring frequency: not checking BP range:  BP medication side effects: no Duration of hyperlipidemia: chronic Cholesterol medication side effects: no Cholesterol supplements: fish oil Medication compliance: good compliance Aspirin: yes Recent stressors: no Recurrent headaches: no Visual changes: no Palpitations: no Dyspnea: no Chest pain: no Lower extremity edema: no Dizzy/lightheaded: no   CHRONIC KIDNEY DISEASE (CKD 3a) CKD status: stable Medications renally dose: yes Previous renal evaluation: no Pneumovax:  Up to Date Influenza Vaccine: Up to Date    POLYMYALGIA RHEUMATICA & RLS Rheumatology last 07/15/24, treated with Cimzia  injections. This regimen has helped her, but reports recently is feeling more fatigued and worsening hand and left foot pain. Duration: chronic Involved joints: right hand, both feet, and left knee Mechanism of injury: arthritis Severity: 7/10 on average Quality:  dull, aching  Frequency: intermittent  Radiation: no Aggravating factors: walking and movement  Alleviating factors: Aspercreme and APAP, injections  Status: stable Treatments attempted: Tylenol  and Aspercreme Relief with NSAIDs?: No NSAIDs Taken Weakness with weight bearing or walking: no Sensation of giving way: no  ANXIETY/STRESS Takes Zoloft  150 MG daily.  Continues Ativan  0.5 MG, takes this at night only, although ordered BID.  Sometimes takes two every night (1 MG), started by previous PCP. Pt aware of risks of benzo medication use to include increased sedation, respiratory suppression, falls, dependence and cardiovascular events.  Pt would like to continue treatment as benefit determined to outweigh risk. On review PDMP last Ativan  fill 09/02/24 for 3 months supply, previous PCP  ordered this way. Duration:stable Anxious mood: no Excessive worrying: no Irritability: no Sweating: no Nausea: no Palpitations:no Hyperventilation: no Panic attacks: no Agoraphobia: no  Obscessions/compulsions: no Depressed mood: no    10/07/2024   11:03 AM 07/01/2024    3:42 PM 04/30/2024   11:27 AM 03/19/2024    8:30 AM 10/16/2023    3:18 PM  Depression screen PHQ 2/9  Decreased Interest 0 0 0 0 0  Down, Depressed, Hopeless 0 0 0 0 0  PHQ - 2 Score 0 0 0 0 0  Altered sleeping 2 0 1 0 0  Tired, decreased energy 1 1 1 1 1   Change in appetite 1 1 1 2  0  Feeling bad or failure about yourself  0 0 0 0 0  Trouble concentrating 0 0 0 0 0  Moving slowly or fidgety/restless 0 0 0 0 0  Suicidal thoughts 0 0 0 0 0  PHQ-9 Score 4 2  3  3  1    Difficult doing work/chores Not difficult at all Not difficult at all Not difficult at all Not difficult at all Not difficult at all     Data saved with a previous flowsheet row definition      10/07/2024   11:03 AM 07/01/2024    3:42 PM 04/30/2024   11:27 AM 03/19/2024    8:31 AM  GAD 7 : Generalized Anxiety Score  Nervous, Anxious, on Edge 0 0 0 0  Control/stop worrying 0 0 0 0  Worry too much - different things 0 0 0 0  Trouble relaxing 0 0 0 0  Restless 0 0 0 0  Easily annoyed or irritable 1 1 1  0  Afraid - awful might happen 0 0 0 0  Total GAD 7 Score 1 1 1  0  Anxiety Difficulty Not difficult at all Not difficult at all  Not difficult at all      Relevant past medical, surgical, family and social history reviewed and updated as indicated. Interim medical history since our last visit reviewed. Allergies and medications reviewed and updated.  Review of Systems  Constitutional:  Positive for fatigue. Negative for activity change, appetite change, diaphoresis and fever.  Respiratory:  Negative for cough, chest tightness, shortness of breath and wheezing.   Cardiovascular:  Negative for chest pain, palpitations and leg swelling.   Gastrointestinal: Negative.   Endocrine: Negative for cold intolerance, heat intolerance, polydipsia, polyphagia and polyuria.  Musculoskeletal:  Positive for arthralgias.  Neurological: Negative.   Psychiatric/Behavioral: Negative.     Per HPI unless specifically indicated above     Objective:    BP 129/74 (BP Location: Left Arm, Patient Position: Sitting, Cuff Size: Normal)   Pulse 64   Temp 98.6 F (37 C) (Oral)   Resp 18   Ht 5' 5 (1.651 m)   Wt 156 lb 3.2 oz (  70.9 kg)   SpO2 98%   BMI 25.99 kg/m   Wt Readings from Last 3 Encounters:  10/07/24 156 lb 3.2 oz (70.9 kg)  07/01/24 156 lb (70.8 kg)  04/30/24 154 lb 12.8 oz (70.2 kg)    Physical Exam Vitals and nursing note reviewed.  Constitutional:      General: She is awake. She is not in acute distress.    Appearance: She is well-developed, well-groomed and overweight. She is not ill-appearing.  HENT:     Head: Normocephalic.     Right Ear: Hearing normal.     Left Ear: Hearing normal.  Eyes:     General: Lids are normal.        Right eye: No discharge.        Left eye: No discharge.     Conjunctiva/sclera: Conjunctivae normal.     Pupils: Pupils are equal, round, and reactive to light.  Neck:     Thyroid : No thyromegaly.     Vascular: Carotid bruit (bilateral) present.  Cardiovascular:     Rate and Rhythm: Normal rate and regular rhythm.     Heart sounds: Normal heart sounds. No murmur heard.    No gallop.  Pulmonary:     Effort: Pulmonary effort is normal. No accessory muscle usage or respiratory distress.     Breath sounds: No wheezing or rhonchi.  Abdominal:     General: Bowel sounds are normal.     Palpations: Abdomen is soft.  Musculoskeletal:     Cervical back: Normal range of motion and neck supple.     Right lower leg: No edema.     Left lower leg: No edema.  Skin:    General: Skin is warm and dry.  Neurological:     Mental Status: She is alert and oriented to person, place, and time.   Psychiatric:        Attention and Perception: Attention normal.        Mood and Affect: Mood normal.        Speech: Speech normal.        Behavior: Behavior normal. Behavior is cooperative.        Thought Content: Thought content normal.    Results for orders placed or performed in visit on 10/07/24  Bayer DCA Hb A1c Waived   Collection Time: 10/07/24 11:03 AM  Result Value Ref Range   HB A1C (BAYER DCA - WAIVED) 7.0 (H) 4.8 - 5.6 %      Assessment & Plan:   Problem List Items Addressed This Visit       Cardiovascular and Mediastinum   Hypertension associated with diabetes (HCC)   Chronic, stable.  BP at goal for age.  Continue current medication regimen and collaboration with cardiology. Labs: BMP.  Recommend she monitor BP at least three days a week at home and document for providers + bring to visits.  DASH diet focus.  Return in 3 months.      Relevant Orders   Bayer DCA Hb A1c Waived (Completed)   Basic metabolic panel with GFR   Coronary artery disease of bypass graft of native heart with stable angina pectoris   Chronic, stable.  No recent chest pain.  Continue collaboration with cardiology and preventative treatment, including statin and ASA.        Digestive   Cirrhosis, non-alcoholic (HCC)   Ongoing, will continue collaboration with GI at this time.  Remain off Januvia  and GLP1.  Recent GI notes reviewed.  Labs today.        Endocrine   Type 2 diabetes mellitus with hyperglycemia, with long-term current use of insulin  (HCC) - Primary   Chronic, ongoing, followed by endocrinology in past.  A1c 7% today, at goal for age per geriatric society, <8%. Her previous levels in 9-10 range on average, including with endocrinology.  She wishes to avoid return to endo at this time as her previous provider left.  Will continue Basaglar  and adjust further as needed based on A1c result + continue sliding scale only at this time. Has been off Metformin  for >3 months and A1c has  trended down. Continue collaboration with [facility] as needed. Bring Libre to all visits. Return to office in 3 months -- goal to avoid hypoglycemia.  If consistent poor control will get back into endo. Have advised her if constant or increase low sugars to alert PCP and lower meal time insulin  to 14 units. - Eye and foot exam up to date - Statin on board.  ARB on board. - Vaccinations up to date.      Relevant Medications   Insulin  Pen Needle (BD PEN NEEDLE NANO 2ND GEN) 32G X 4 MM MISC   Other Relevant Orders   Bayer DCA Hb A1c Waived (Completed)   Hyperlipidemia associated with type 2 diabetes mellitus (HCC)   Chronic, ongoing.  Continue current medication regimen and adjust as needed.  Lipid panel today.  Discussed with patient, if myalgias with Atorvastatin , + her history of myalgia with statins, could consider Repatha initiation if poor tolerance.      Relevant Orders   Bayer DCA Hb A1c Waived (Completed)   Lipid Panel w/o Chol/HDL Ratio   CKD stage 3 due to type 2 diabetes mellitus (HCC)   Chronic, stable Stage 3a, although recent labs have shown improvement.  Continue Losartan  for kidney protection.  Urine ALB 80 January 2025. Continue collaboration with cardiology.  Consider nephrology referral if worsening function.  Labs: BMP. Refer to diabetes with hyperglycemia for further.      Relevant Orders   Bayer DCA Hb A1c Waived (Completed)     Musculoskeletal and Integument   Rheumatoid arthritis of multiple sites with negative rheumatoid factor (HCC)   Chronic, stable.  Followed by rheumatology.  Continue this collaboration.  Recent notes reviewed.  Has injections with them.         Other   Long-term current use of benzodiazepine   Chronic, ongoing for insomnia & anxiety.  Has had trials of other medications and reductions with poor outcomes.  Fully aware of risks of chronic benzo use and aware this will be discussed each visit.  UDS due next 07/01/25 and contract on file.       Insulin  long-term use (HCC)   Refer to diabetes with hyperglycemia plan of care.      Relevant Orders   Bayer DCA Hb A1c Waived (Completed)   Insomnia   Chronic, ongoing with use of Lorazepam  nightly.  Has had trial reductions in past and other medications without success.  Is fully aware of risks of chronic benzo use and aware this will be discussed each visit.  Return in 3 months.  UDS due next 07/01/25, contract up to date.      Depression, recurrent   Chronic, ongoing.  Denies SI/HI.  Continue Sertraline  150 MG daily as is ordered. This is offering benefit to mood overall and may help reduce benzo use.  Consider increase to 200 MG in future for  increased benefit, she does not want to change at this time.  Return in 3 months.      Anxiety disorder   Chronic, ongoing.  Denies SI/HI.  Continue Sertraline  and Ativan .  Has had multiple attempts at reduction of benzo with no success.  Discussed at length risks with her and she is aware, wishes to continue current treatment.  Continue Ativan  and fill upon request.  PDMP reviewed.  Return in 3 months, UDS due next 07/01/25 and contract on file.        Follow up plan: Return in about 3 months (around 01/07/2025) for T2DM, HTN/HLD, ANXIETY, RA.

## 2024-10-08 ENCOUNTER — Ambulatory Visit: Payer: Self-pay | Admitting: Nurse Practitioner

## 2024-10-08 LAB — LIPID PANEL W/O CHOL/HDL RATIO
Cholesterol, Total: 156 mg/dL (ref 100–199)
HDL: 59 mg/dL (ref >39)
LDL Chol Calc (NIH): 73 mg/dL (ref 0–99)
Triglycerides: 137 mg/dL (ref 0–149)
VLDL Cholesterol Cal: 24 mg/dL (ref 5–40)

## 2024-10-08 LAB — BASIC METABOLIC PANEL WITH GFR
BUN/Creatinine Ratio: 19 (ref 12–28)
BUN: 18 mg/dL (ref 8–27)
CO2: 25 mmol/L (ref 20–29)
Calcium: 10 mg/dL (ref 8.7–10.3)
Chloride: 103 mmol/L (ref 96–106)
Creatinine, Ser: 0.94 mg/dL (ref 0.57–1.00)
Glucose: 111 mg/dL — ABNORMAL HIGH (ref 70–99)
Potassium: 3.8 mmol/L (ref 3.5–5.2)
Sodium: 141 mmol/L (ref 134–144)
eGFR: 62 mL/min/1.73 (ref >59)

## 2024-10-08 NOTE — Progress Notes (Signed)
 Contacted via MyChart  Good afternoon Laura Rojas, your labs have returned and overall remain stable. No medication changes needed.  Any questions? Keep being amazing!!  Thank you for allowing me to participate in your care.  I appreciate you. Kindest regards, Maxene Byington

## 2024-10-14 DIAGNOSIS — M0609 Rheumatoid arthritis without rheumatoid factor, multiple sites: Secondary | ICD-10-CM | POA: Diagnosis not present

## 2024-10-16 ENCOUNTER — Telehealth (HOSPITAL_BASED_OUTPATIENT_CLINIC_OR_DEPARTMENT_OTHER): Payer: Self-pay | Admitting: *Deleted

## 2024-10-16 DIAGNOSIS — I10 Essential (primary) hypertension: Secondary | ICD-10-CM | POA: Diagnosis not present

## 2024-10-16 DIAGNOSIS — R151 Fecal smearing: Secondary | ICD-10-CM | POA: Diagnosis not present

## 2024-10-16 DIAGNOSIS — K5909 Other constipation: Secondary | ICD-10-CM | POA: Diagnosis not present

## 2024-10-16 DIAGNOSIS — M0609 Rheumatoid arthritis without rheumatoid factor, multiple sites: Secondary | ICD-10-CM | POA: Diagnosis not present

## 2024-10-16 DIAGNOSIS — Z860101 Personal history of adenomatous and serrated colon polyps: Secondary | ICD-10-CM | POA: Diagnosis not present

## 2024-10-16 DIAGNOSIS — Z794 Long term (current) use of insulin: Secondary | ICD-10-CM | POA: Diagnosis not present

## 2024-10-16 DIAGNOSIS — E1165 Type 2 diabetes mellitus with hyperglycemia: Secondary | ICD-10-CM | POA: Diagnosis not present

## 2024-10-16 NOTE — Telephone Encounter (Signed)
   Pre-operative Risk Assessment    Patient Name: Laura Rojas  DOB: June 11, 1946 MRN: [id number]   Date of last office visit: 01/23/24 DR. CROITORU Date of next office visit: NONE   Request for Surgical Clearance    Procedure:  COLONOSCOPY  Date of Surgery:  Clearance 12/10/24                                Surgeon:  DR. FRANCIS BOSS Surgeon's Group or Practice Name:  [facility] Phone number:  [phone] [facility], CMA Fax number:  [phone]   Type of Clearance Requested:   - Medical  - Pharmacy:  Hold Clopidogrel  (Plavix ) x 5 DAYS PRIOR   Type of Anesthesia:  Not Indicated (PROPOFOL ? )   Additional requests/questions:    Bonney Niels Jest   10/16/2024, 5:20 PM

## 2024-10-17 NOTE — Telephone Encounter (Signed)
 Okay to hold clopidogrel  for 5 days before colonoscopy.  Thanks

## 2024-10-17 NOTE — Telephone Encounter (Signed)
 Left message on machine for pt to contact the office to schedule tele appt for pre op.

## 2024-10-17 NOTE — Telephone Encounter (Signed)
 Dr. Francyne , patient's chart was reviewed for preoperative cardiac evaluation. Based on our protocols, the size of the coronary stent is (unknown, >25-30 mm stent in 1 vessel, LM/oLAD stent, overlapping stents, prior stent thrombosis) therefore we are asking that you comment on request to hold anti-platelet therapy for upcoming procedure.   CAD (coronary artery disease) 12/31/2013     2007, 3.0 x 28 mm drug-eluting Cypher stent to RCA, 3.5 x 33 mm drug-eluting Cypher stent and proximal LAD) Consent by 2009 Normal perfusion by mildly 2013     Please route your response to p cv div preop.    Thank you, Laura Rojas

## 2024-10-17 NOTE — Telephone Encounter (Signed)
   Name: Laura Rojas  DOB: May 29, 1946  MRN: [id number]  Primary Cardiologist: Jerel Balding, MD   Preoperative team, please contact this patient and set up a phone call appointment for further preoperative risk assessment. Please obtain consent and complete medication review. Thank you for your help.  I confirm that guidance regarding antiplatelet and oral anticoagulation therapy has been completed and, if necessary, noted below.  Per Dr. Balding 10/17/2024 Okay to hold clopidogrel  for 5 days before colonoscopy. Thanks   I also confirmed the patient resides in the state of [place] . As per [facility] [employer] telemedicine laws, the patient must reside in the state in which the provider is licensed.   Lamarr Satterfield, NP 10/17/2024, 11:06 AM [facility]

## 2024-10-20 ENCOUNTER — Telehealth (HOSPITAL_BASED_OUTPATIENT_CLINIC_OR_DEPARTMENT_OTHER): Payer: Self-pay | Admitting: *Deleted

## 2024-10-20 NOTE — Telephone Encounter (Signed)
 Pt has been scheduled tele preop appt 11/10/24. Med rec and consent are done.

## 2024-10-20 NOTE — Telephone Encounter (Signed)
 Pt has been scheduled tele preop appt 11/10/24. Med rec and consent are done.      Patient Consent for Virtual Visit        Laura Rojas has provided verbal consent on 10/20/2024 for a virtual visit (video or telephone).   CONSENT FOR VIRTUAL VISIT FOR:  Laura Rojas  By participating in this virtual visit I agree to the following:  I hereby voluntarily request, consent and authorize [facility] and its employed or contracted physicians, physician assistants, nurse practitioners or other licensed health care professionals (the Practitioner), to provide me with telemedicine health care services (the "Services) as deemed necessary by the treating Practitioner. I acknowledge and consent to receive the Services by the Practitioner via telemedicine. I understand that the telemedicine visit will involve communicating with the Practitioner through live audiovisual communication technology and the disclosure of certain medical information by electronic transmission. I acknowledge that I have been given the opportunity to request an in-person assessment or other available alternative prior to the telemedicine visit and am voluntarily participating in the telemedicine visit.  I understand that I have the right to withhold or withdraw my consent to the use of telemedicine in the course of my care at any time, without affecting my right to future care or treatment, and that the Practitioner or I may terminate the telemedicine visit at any time. I understand that I have the right to inspect all information obtained and/or recorded in the course of the telemedicine visit and may receive copies of available information for a reasonable fee.  I understand that some of the potential risks of receiving the Services via telemedicine include:  Delay or interruption in medical evaluation due to technological equipment failure or disruption; Information transmitted may not be sufficient (e.g. poor  resolution of images) to allow for appropriate medical decision making by the Practitioner; and/or  In rare instances, security protocols could fail, causing a breach of personal health information.  Furthermore, I acknowledge that it is my responsibility to provide information about my medical history, conditions and care that is complete and accurate to the best of my ability. I acknowledge that Practitioner's advice, recommendations, and/or decision may be based on factors not within their control, such as incomplete or inaccurate data provided by me or distortions of diagnostic images or specimens that may result from electronic transmissions. I understand that the practice of medicine is not an exact science and that Practitioner makes no warranties or guarantees regarding treatment outcomes. I acknowledge that a copy of this consent can be made available to me via my patient portal [facility] MyChart), or I can request a printed copy by calling the office of [facility].    I understand that my insurance will be billed for this visit.   I have read or had this consent read to me. I understand the contents of this consent, which adequately explains the benefits and risks of the Services being provided via telemedicine.  I have been provided ample opportunity to ask questions regarding this consent and the Services and have had my questions answered to my satisfaction. I give my informed consent for the services to be provided through the use of telemedicine in my medical care

## 2024-10-21 ENCOUNTER — Ambulatory Visit: Payer: Self-pay | Admitting: Emergency Medicine

## 2024-10-21 VITALS — BP 116/66 | Ht 65.0 in | Wt 158.0 lb

## 2024-10-21 DIAGNOSIS — Z0000 Encounter for general adult medical examination without abnormal findings: Secondary | ICD-10-CM | POA: Diagnosis not present

## 2024-10-21 DIAGNOSIS — Z1231 Encounter for screening mammogram for malignant neoplasm of breast: Secondary | ICD-10-CM

## 2024-10-21 NOTE — Progress Notes (Signed)
 Chief Complaint  Patient presents with   Medicare Wellness     Subjective:   Laura Rojas is a 78 y.o. female who presents for a Medicare Annual Wellness Visit.  Allergies (verified) Compazine [prochlorperazine edisylate], Altace [ramipril], Crestor [rosuvastatin calcium ], Reglan [metoclopramide], Sulfa antibiotics, Actos [pioglitazone], Amlodipine , Latex, and Lipitor [atorvastatin ]   History: Past Medical History:  Diagnosis Date   Arthritis    Shoulders, hands, feet   CAD (coronary artery disease) 12/31/2013   2007, 3.0 x 28 mm drug-eluting Cypher stent to RCA, 3.5 x 33 mm drug-eluting Cypher stent and proximal LAD) Consent by 2009 Normal perfusion by mildly 2013    Diabetes mellitus without complication (HCC)    Dyslipidemia    GERD (gastroesophageal reflux disease)    Hypertension    Polymyalgia rheumatica syndrome    Subclavian artery stenosis, left 01/01/2015   Past Surgical History:  Procedure Laterality Date   BREAST EXCISIONAL BIOPSY Right 1970   EXCISIONAL - NEG   BREAST EXCISIONAL BIOPSY Left 1973   EXCISIONAL - NEG   BREAST SURGERY     CARDIAC CATHETERIZATION  08/25/2008   patent stents   CAROTID DUPLEX  10/2006   NORMAL CAROTID ARTERY DUPLEX   CATARACT EXTRACTION W/PHACO Left 04/13/2020   Procedure: CATARACT EXTRACTION PHACO AND INTRAOCULAR LENS PLACEMENT (IOC) LEFT DIABETIC 4.69  00:44.0;  Surgeon: Jaye Fallow, MD;  Location: [facility];  Service: Ophthalmology;  Laterality: Left;  Diabetic - insulin  and oral meds   CATARACT EXTRACTION W/PHACO Right 05/04/2020   Procedure: CATARACT EXTRACTION PHACO AND INTRAOCULAR LENS PLACEMENT (IOC) RIGHT DIABETIC 3.92  00:27.1;  Surgeon: Jaye Fallow, MD;  Location: [facility];  Service: Ophthalmology;  Laterality: Right;  Diabetic - insulin  and oral meds   COLONOSCOPY WITH PROPOFOL  N/A 08/19/2019   Procedure: COLONOSCOPY WITH PROPOFOL ;  Surgeon: Jinny Carmine, MD;  Location: [facility] ENDOSCOPY;  Service:  Endoscopy;  Laterality: N/A;   CORONARY ANGIOPLASTY WITH STENT PLACEMENT  09/17/2006   PTCA & stenting LAD   ESOPHAGOGASTRODUODENOSCOPY N/A 04/27/2015   Procedure: ESOPHAGOGASTRODUODENOSCOPY (EGD);  Surgeon: Carmine Jinny, MD;  Location: [facility] ENDOSCOPY;  Service: Endoscopy;  Laterality: N/A;   JOINT REPLACEMENT     NM MYOCAR MULTIPLE W/SPECT  11/2011   EF 72%.NORMAL MYOCARDIAL PERFUSION STUDY   REPLACEMENT TOTAL KNEE  2003 & 2004   TRANSTHORACIC ECHOCARDIOGRAM  09/2006   EF =>55%. IMPAIRED LV RELAXATION. MILD MITRAL ANNULAR CALCIFICATION. AV-MILDLY SCLEROTIC. NO AS.   VAGINAL HYSTERECTOMY  1983   Family History  Problem Relation Age of Onset   Breast cancer Mother 46   Arthritis Father    Cirrhosis Father        non-alcoholic   Diabetes Sister    Heart disease Maternal Grandmother    Heart disease Maternal Grandfather    Brain cancer Grandchild    Social History   Occupational History   Occupation: retired  Tobacco Use   Smoking status: Never   Smokeless tobacco: Never  Vaping Use   Vaping status: Never Used  Substance and Sexual Activity   Alcohol use: No    Alcohol/week: 0.0 standard drinks of alcohol   Drug use: Never   Sexual activity: Not on file   Tobacco Counseling Counseling given: Not Answered  SDOH Screenings   Food Insecurity: No Food Insecurity (10/21/2024)  Housing: Low Risk  (10/21/2024)  Transportation Needs: No Transportation Needs (10/21/2024)  Utilities: Not At Risk (10/21/2024)  Alcohol Screen: Low Risk  (10/16/2023)  Depression (PHQ2-9): Low Risk  (  10/21/2024)  Financial Resource Strain: Low Risk  (10/20/2024)  Physical Activity: Insufficiently Active (10/21/2024)  Social Connections: Socially Integrated (10/21/2024)  Stress: Stress Concern Present (10/21/2024)  Tobacco Use: Low Risk  (10/21/2024)  Health Literacy: Adequate Health Literacy (10/21/2024)   See flowsheets for full screening details  Depression Screen PHQ 2 & 9 Depression Scale-  Over the past 2 weeks, how often have you been bothered by any of the following problems? Little interest or pleasure in doing things: 0 Feeling down, depressed, or hopeless (PHQ Adolescent also includes...irritable): 0 PHQ-2 Total Score: 0 Trouble falling or staying asleep, or sleeping too much: 1 Feeling tired or having little energy: 1 Poor appetite or overeating (PHQ Adolescent also includes...weight loss): 0 Feeling bad about yourself - or that you are a failure or have let yourself or your family down: 0 Trouble concentrating on things, such as reading the newspaper or watching television (PHQ Adolescent also includes...like school work): 0 Moving or speaking so slowly that other people could have noticed. Or the opposite - being so fidgety or restless that you have been moving around a lot more than usual: 0 Thoughts that you would be better off dead, or of hurting yourself in some way: 0 PHQ-9 Total Score: 2 If you checked off any problems, how difficult have these problems made it for you to do your work, take care of things at home, or get along with other people?: Not difficult at all  Depression Treatment Depression Interventions/Treatment : EYV7-0 Score <4 Follow-up Not Indicated     Goals Addressed               This Visit's Progress     exercise more and lose weight (pt-stated)         Visit info / Clinical Intake: Medicare Wellness Visit Type:: Subsequent Annual Wellness Visit Persons participating in visit:: patient Medicare Wellness Visit Mode:: In-person (required for WTM) Information given by:: patient Interpreter Needed?: No Pre-visit prep was completed: yes AWV questionnaire completed by patient prior to visit?: yes Date:: 10/20/24 Living arrangements:: lives with spouse/significant other Patient's Overall Health Status Rating: good Typical amount of pain: some Does pain affect daily life?: no Are you currently prescribed opioids?: no  Dietary Habits  and Nutritional Risks How many meals a day?: 3 Eats fruit and vegetables daily?: yes Most meals are obtained by: eating out In the last 2 weeks, have you had any of the following?: none Diabetic:: (!) yes Any non-healing wounds?: no How often do you check your BS?: continuous glucose monitor Would you like to be referred to a Nutritionist or for Diabetic Management? : no  Functional Status Activities of Daily Living (to include ambulation/medication): Independent Ambulation: Independent with device- listed below Home Assistive Devices/Equipment: Eyeglasses (glasses for reading) Medication Administration: Independent Home Management: Independent Manage your own finances?: yes Primary transportation is: driving Concerns about vision?: no *vision screening is required for WTM* Concerns about hearing?: no  Fall Screening Falls in the past year?: 1 Number of falls in past year: 1 Was there an injury with Fall?: 0 Fall Risk Category Calculator: 2 Patient Fall Risk Level: Moderate Fall Risk  Fall Risk Patient at Risk for Falls Due to: History of fall(s); Impaired balance/gait Fall risk Follow up: Falls evaluation completed; Education provided; Falls prevention discussed  Home and Transportation Safety: All rugs have non-skid backing?: yes All stairs or steps have railings?: yes Grab bars in the bathtub or shower?: yes Have non-skid surface in bathtub or  shower?: yes Good home lighting?: yes Regular seat belt use?: yes Hospital stays in the last year:: no  Cognitive Assessment Difficulty concentrating, remembering, or making decisions? : yes (sometimes with remembering) Will 6CIT or Mini Cog be Completed: yes What year is it?: 0 points What month is it?: 0 points Give patient an address phrase to remember (5 components): [address] [place] About what time is it?: 0 points Count backwards from 20 to 1: 0 points Say the months of the year in reverse: 0 points Repeat the  address phrase from earlier: 2 points 6 CIT Score: 2 points  Advance Directives (For Healthcare) Does Patient Have a Medical Advance Directive?: No Would patient like information on creating a medical advance directive?: No - Patient declined  Reviewed/Updated  Reviewed/Updated: Reviewed All (Medical, Surgical, Family, Medications, Allergies, Care Teams, Patient Goals)        Objective:    Today's Vitals   10/21/24 1357  BP: 116/66  Weight: 158 lb (71.7 kg)  Height: 5' 5 (1.651 m)   Body mass index is 26.29 kg/m.  Current Medications (verified) Outpatient Encounter Medications as of 10/21/2024  Medication Sig   acetaminophen  (TYLENOL ) 500 MG tablet Take 500 mg by mouth 2 (two) times daily. For arthritis pain   amLODipine  (NORVASC ) 2.5 MG tablet Take 1 tablet (2.5 mg total) by mouth daily.   aspirin 81 MG tablet Take 81 mg by mouth daily.   atorvastatin  (LIPITOR) 40 MG tablet TAKE 1 TABLET BY MOUTH EVERY DAY** STOP THE SIMVASTATIN    certolizumab pegol  (CIMZIA ) 2 X 200 MG/ML PSKT Inject into the skin.   cholecalciferol (VITAMIN D3) 25 MCG (1000 UT) tablet Take 1,000 Units by mouth daily.   clopidogrel  (PLAVIX ) 75 MG tablet Take 1 tablet (75 mg total) by mouth daily.   Continuous Blood Gluc Sensor (FREESTYLE LIBRE 14 DAY SENSOR) MISC USE 1 KIT EVERY 14 (FOURTEEN) DAYS   hydrochlorothiazide  (MICROZIDE ) 12.5 MG capsule TAKE 1 CAPSULE BY MOUTH EVERY DAY   Insulin  Aspart FlexPen (NOVOLOG ) 100 UNIT/ML INJECT 20-25 UNITS INTO THE SKIN 3 (THREE) TIMES DAILY. INJECT PER SLIDING SCALE REGIMEN.   Insulin  Glargine (BASAGLAR  KWIKPEN) 100 UNIT/ML Inject 16 Units into the skin at bedtime.   Insulin  Pen Needle (BD PEN NEEDLE NANO 2ND GEN) 32G X 4 MM MISC To use while injecting insulin  4 times a day.   LORazepam  (ATIVAN ) 0.5 MG tablet TAKE 1 TAB BY MOUTH TWICE A DAY (USE THE ADDITIONAL 30 TABS ONLY SPARINGLY & ONLY FOR SEVERE ANXIETY) (Patient taking differently: TAKE 1 TAB BY MOUTH TWICE A DAY  (USE THE ADDITIONAL 30 TABS ONLY SPARINGLY & ONLY FOR SEVERE ANXIETY) Taking 2 tablets at bedtime)   losartan  (COZAAR ) 100 MG tablet Take 1 tablet (100 mg total) by mouth daily.   Magnesium 400 MG TABS Take 1 tablet by mouth daily.   Multiple Vitamin (MULTIVITAMIN) tablet Take 1 tablet by mouth daily.   Omega-3 Fatty Acids (FISH OIL) 1200 MG CAPS Take 1,200 mg by mouth daily.   pantoprazole  (PROTONIX ) 40 MG tablet Take 1 tablet (40 mg total) by mouth daily.   sertraline  (ZOLOFT ) 100 MG tablet TAKE 1.5 TABLETS BY MOUTH DAILY   vitamin B-12 (CYANOCOBALAMIN ) 1000 MCG tablet Take 1,000 mcg by mouth daily.   VITRON-C 65-125 MG TABS TAKE 1 TABLET BY MOUTH EVERY DAY   alendronate  (FOSAMAX ) 70 MG tablet Take 70 mg by mouth once a week. (Patient not taking: Reported on 10/21/2024)   metFORMIN  (GLUCOPHAGE )  1000 MG tablet Take 0.5 tablets (500 mg total) by mouth 2 (two) times daily. (Patient not taking: Reported on 10/21/2024)   No facility-administered encounter medications on file as of 10/21/2024.   Hearing/Vision screen Hearing Screening - Comments:: Denies hearing loss  Vision Screening - Comments:: UTD at [facility] [place] Immunizations and Health Maintenance Health Maintenance  Topic Date Due   COVID-19 Vaccine (4 - 2025-26 season) 10/21/2024 (Originally 07/28/2024)   OPHTHALMOLOGY EXAM  12/04/2024   Diabetic kidney evaluation - Urine ACR  12/11/2024   Mammogram  12/24/2024   FOOT EXAM  03/19/2025   HEMOGLOBIN A1C  04/06/2025   Diabetic kidney evaluation - eGFR measurement  10/07/2025   Medicare Annual Wellness (AWV)  10/21/2025   Bone Density Scan  12/24/2025   DTaP/Tdap/Td (3 - Tdap) 03/17/2030   Pneumococcal Vaccine: 50+ Years  Completed   Influenza Vaccine  Completed   Hepatitis C Screening  Completed   Zoster Vaccines- Shingrix  Completed   Meningococcal B Vaccine  Aged Out   Colonoscopy  Discontinued        Assessment/Plan:  This is a routine wellness examination  for Laura Rojas.  Patient Care Team: Cannady, Jolene T, NP as PCP - General (Nurse Practitioner) Pa, [facility] (Optometry) Tobie Lady POUR, MD as Consulting Physician (Rheumatology) Hester Alm BROCKS, MD (Dermatology) Aundria Ladell POUR, MD as Consulting Physician (Gastroenterology) Croitoru, Jerel, MD as Consulting Physician (Cardiology)  I have personally reviewed and noted the following in the patient's chart:   Medical and social history Use of alcohol, tobacco or illicit drugs  Current medications and supplements including opioid prescriptions. Functional ability and status Nutritional status Physical activity Advanced directives List of other physicians Hospitalizations, surgeries, and ER visits in previous 12 months Vitals Screenings to include cognitive, depression, and falls Referrals and appointments  Orders Placed This Encounter  Procedures   MM 3D SCREENING MAMMOGRAM BILATERAL BREAST    Standing Status:   Future    Expected Date:   12/24/2024    Expiration Date:   10/21/2025    Reason for Exam (SYMPTOM  OR DIAGNOSIS REQUIRED):   screening for breast cancer    Preferred imaging location?:   [facility]   In addition, I have reviewed and discussed with patient certain preventive protocols, quality metrics, and best practice recommendations. A written personalized care plan for preventive services as well as general preventive health recommendations were provided to patient.   Vina Ned, CMA   10/21/2024   Return in 53 weeks (on 10/27/2025).  After Visit Summary: (In Person-Printed) AVS printed and given to the patient  Nurse Notes:  6 CIT Score - 2 Placed order for MMG (due ~ 12/24/24) Has colonoscopy scheduled for 12/10/24 Has DM eye exam scheduled 11/2024 Declined Covid vaccine Declined DM & Nutrition education referral

## 2024-10-21 NOTE — Patient Instructions (Signed)
 Ms. Goforth,  Thank you for taking the time for your Medicare Wellness Visit. I appreciate your continued commitment to your health goals. Please review the care plan we discussed, and feel free to reach out if I can assist you further.  Please note that Annual Wellness Visits do not include a physical exam. Some assessments may be limited, especially if the visit was conducted virtually. If needed, we may recommend an in-person follow-up with your provider.  Ongoing Care Seeing your primary care provider every 3 to 6 months helps us  monitor your health and provide consistent, personalized care.   Referrals If a referral was made during today's visit and you haven't received any updates within two weeks, please contact the referred provider directly to check on the status.  Recommended Screenings:  Please call to schedule your mammogram (due after 12/24/24):  [facility] Address: [address], [place], [place] Phone: [phone]  [facility] [address], [address] [place], [place] [address] Phone: [phone]   Health Maintenance  Topic Date Due   Medicare Annual Wellness Visit  10/15/2024   COVID-19 Vaccine (4 - 2025-26 season) 10/21/2024*   Eye exam for diabetics  12/04/2024   Yearly kidney health urinalysis for diabetes  12/11/2024   Breast Cancer Screening  12/24/2024   Complete foot exam   03/19/2025   Hemoglobin A1C  04/06/2025   Yearly kidney function blood test for diabetes  10/07/2025   Osteoporosis screening with Bone Density Scan  12/24/2025   DTaP/Tdap/Td vaccine (3 - Tdap) 03/17/2030   Pneumococcal Vaccine for age over 75  Completed   Flu Shot  Completed   Hepatitis C Screening  Completed   Zoster (Shingles) Vaccine  Completed   Meningitis B Vaccine  Aged Out   Colon Cancer Screening  Discontinued  *Topic was postponed. The date shown is not the original due date.       10/21/2024     2:04 PM  Advanced Directives  Does Patient Have a Medical Advance Directive? No  Would patient like information on creating a medical advance directive? No - Patient declined    Vision: Annual vision screenings are recommended for early detection of glaucoma, cataracts, and diabetic retinopathy. These exams can also reveal signs of chronic conditions such as diabetes and high blood pressure.  Dental: Annual dental screenings help detect early signs of oral cancer, gum disease, and other conditions linked to overall health, including heart disease and diabetes.  Please see the attached documents for additional preventive care recommendations.

## 2024-11-10 ENCOUNTER — Ambulatory Visit: Attending: Cardiology | Admitting: Physician Assistant

## 2024-11-10 DIAGNOSIS — Z01810 Encounter for preprocedural cardiovascular examination: Secondary | ICD-10-CM

## 2024-11-10 NOTE — Progress Notes (Signed)
 Virtual Visit via Telephone Note   Because of Laura Rojas co-morbid illnesses, she is at least at moderate risk for complications without adequate follow up.  This format is felt to be most appropriate for this patient at this time.  Due to technical limitations with video connection (technology), today's appointment will be conducted as an audio only telehealth visit, and Laura Rojas verbally agreed to proceed in this manner.   All issues noted in this document were discussed and addressed.  No physical exam could be performed with this format.  Evaluation Performed:  Preoperative cardiovascular risk assessment _____________   Date:  11/10/2024   Patient ID:  Laura Rojas, DOB [birth year], MRN [id number] Patient Location:  Home Provider location:   Office  Primary Care Provider:  Valerio Melanie DASEN, NP Primary Cardiologist:  None  Chief Complaint / Patient Profile   78 y.o. y/o female with a h/o coronary artery disease, PAD (left subclavian stenosis), DM, HTN, hyperlipidemia, polymyalgia rheumatica who is pending colonoscopy and presents today for telephonic preoperative cardiovascular risk assessment.  History of Present Illness    Laura Rojas is a 78 y.o. female who presents via audio/video conferencing for a telehealth visit today.  Pt was last seen in cardiology clinic on 01/23/2024 by Dr. Francyne.  At that time Laura Rojas was doing well .  The patient is now pending procedure as outlined above. Since her last visit, she has been doing well from a heart standpoint. No CP, SOB, or swelling. She can walk 1-2 blocks, and climbing stairs. Shopping she enjoys.   Okay to hold clopidogrel  for 5 days before colonoscopy. Restart when medically safe to do so. She will need to continue the aspirin throughout her procedure.   Past Medical History    Past Medical History:  Diagnosis Date   Arthritis    Shoulders, hands, feet   CAD (coronary artery disease) 12/31/2013   2007,  3.0 x 28 mm drug-eluting Cypher stent to RCA, 3.5 x 33 mm drug-eluting Cypher stent and proximal LAD) Consent by 2009 Normal perfusion by mildly 2013    Diabetes mellitus without complication (HCC)    Dyslipidemia    GERD (gastroesophageal reflux disease)    Hypertension    Polymyalgia rheumatica syndrome    Subclavian artery stenosis, left 01/01/2015   Past Surgical History:  Procedure Laterality Date   BREAST EXCISIONAL BIOPSY Right 1970   EXCISIONAL - NEG   BREAST EXCISIONAL BIOPSY Left 1973   EXCISIONAL - NEG   BREAST SURGERY     CARDIAC CATHETERIZATION  08/25/2008   patent stents   CAROTID DUPLEX  10/2006   NORMAL CAROTID ARTERY DUPLEX   CATARACT EXTRACTION W/PHACO Left 04/13/2020   Procedure: CATARACT EXTRACTION PHACO AND INTRAOCULAR LENS PLACEMENT (IOC) LEFT DIABETIC 4.69  00:44.0;  Surgeon: Jaye Fallow, MD;  Location: [facility];  Service: Ophthalmology;  Laterality: Left;  Diabetic - insulin  and oral meds   CATARACT EXTRACTION W/PHACO Right 05/04/2020   Procedure: CATARACT EXTRACTION PHACO AND INTRAOCULAR LENS PLACEMENT (IOC) RIGHT DIABETIC 3.92  00:27.1;  Surgeon: Jaye Fallow, MD;  Location: [facility];  Service: Ophthalmology;  Laterality: Right;  Diabetic - insulin  and oral meds   COLONOSCOPY WITH PROPOFOL  N/A 08/19/2019   Procedure: COLONOSCOPY WITH PROPOFOL ;  Surgeon: Jinny Carmine, MD;  Location[facility] END[facility];  Service: Endoscopy;  Laterality: N/A;   CORONARY ANGIOPLASTY WITH STENT PLACEMENT  09/17/2006   PTCA & stenting LAD   ESOPHAGOGASTRODUODENOSCOPY N/A 04/27/2015  Procedure: ESOPHAGOGASTRODUODENOSCOPY (EGD);  Surgeon: Rogelia Copping, MD;  Location: [facility] ENDOSCOPY;  Service: Endoscopy;  Laterality: N/A;   JOINT REPLACEMENT     NM MYOCAR MULTIPLE W/SPECT  11/2011   EF 72%.NORMAL MYOCARDIAL PERFUSION STUDY   REPLACEMENT TOTAL KNEE  2003 & 2004   TRANSTHORACIC ECHOCARDIOGRAM  09/2006   EF =>55%. IMPAIRED LV RELAXATION. MILD MITRAL ANNULAR  CALCIFICATION. AV-MILDLY SCLEROTIC. NO AS.   VAGINAL HYSTERECTOMY  1983    Allergies  Allergies[1]  Home Medications    Prior to Admission medications  Medication Sig Start Date End Date Taking? Authorizing Provider  acetaminophen  (TYLENOL ) 500 MG tablet Take 500 mg by mouth 2 (two) times daily. For arthritis pain    [provider]  alendronate  (FOSAMAX ) 70 MG tablet Take 70 mg by mouth once a week. Patient not taking: Reported on 10/21/2024 03/16/24   [provider]  amLODipine  (NORVASC ) 2.5 MG tablet Take 1 tablet (2.5 mg total) by mouth daily. 12/12/23   Cannady, Jolene T, NP  aspirin 81 MG tablet Take 81 mg by mouth daily.    [provider]  atorvastatin  (LIPITOR) 40 MG tablet TAKE 1 TABLET BY MOUTH EVERY DAY** STOP THE SIMVASTATIN  12/12/23   Cannady, Jolene T, NP  certolizumab pegol  (CIMZIA ) 2 X 200 MG/ML PSKT Inject into the skin.    [provider]  cholecalciferol (VITAMIN D3) 25 MCG (1000 UT) tablet Take 1,000 Units by mouth daily.    [provider]  clopidogrel  (PLAVIX ) 75 MG tablet Take 1 tablet (75 mg total) by mouth daily. 12/12/23   Cannady, Jolene T, NP  Continuous Blood Gluc Sensor (FREESTYLE LIBRE 14 DAY SENSOR) MISC USE 1 KIT EVERY 14 (FOURTEEN) DAYS 02/06/23   Cannady, Jolene T, NP  hydrochlorothiazide  (MICROZIDE ) 12.5 MG capsule TAKE 1 CAPSULE BY MOUTH EVERY DAY 12/12/23   Cannady, Jolene T, NP  Insulin  Aspart FlexPen (NOVOLOG ) 100 UNIT/ML INJECT 20-25 UNITS INTO THE SKIN 3 (THREE) TIMES DAILY. INJECT PER SLIDING SCALE REGIMEN. 11/05/23   Cannady, Jolene T, NP  Insulin  Glargine (BASAGLAR  KWIKPEN) 100 UNIT/ML Inject 16 Units into the skin at bedtime. 12/12/23   Cannady, Jolene T, NP  Insulin  Pen Needle (BD PEN NEEDLE NANO 2ND GEN) 32G X 4 MM MISC To use while injecting insulin  4 times a day. 10/07/24   Cannady, Jolene T, NP  LORazepam  (ATIVAN ) 0.5 MG tablet TAKE 1 TAB BY MOUTH TWICE A DAY (USE THE ADDITIONAL 30 TABS ONLY SPARINGLY  & ONLY FOR SEVERE ANXIETY) Patient taking differently: TAKE 1 TAB BY MOUTH TWICE A DAY (USE THE ADDITIONAL 30 TABS ONLY SPARINGLY & ONLY FOR SEVERE ANXIETY) Taking 2 tablets at bedtime 09/02/24   Cannady, Jolene T, NP  losartan  (COZAAR ) 100 MG tablet Take 1 tablet (100 mg total) by mouth daily. 12/12/23   Cannady, Jolene T, NP  Magnesium 400 MG TABS Take 1 tablet by mouth daily.    [provider]  metFORMIN  (GLUCOPHAGE ) 1000 MG tablet Take 0.5 tablets (500 mg total) by mouth 2 (two) times daily. Patient not taking: Reported on 10/21/2024 12/12/23   Cannady, Jolene T, NP  Multiple Vitamin (MULTIVITAMIN) tablet Take 1 tablet by mouth daily.    [provider]  Omega-3 Fatty Acids (FISH OIL) 1200 MG CAPS Take 1,200 mg by mouth daily.    [provider]  pantoprazole  (PROTONIX ) 40 MG tablet Take 1 tablet (40 mg total) by mouth daily. 12/12/23   Cannady, Jolene T, NP  sertraline  (ZOLOFT ) 100 MG  tablet TAKE 1.5 TABLETS BY MOUTH DAILY 12/12/23   Cannady, Jolene T, NP  vitamin B-12 (CYANOCOBALAMIN ) 1000 MCG tablet Take 1,000 mcg by mouth daily.    [provider]  VITRON-C 65-125 MG TABS TAKE 1 TABLET BY MOUTH EVERY DAY 06/10/24   Cannady, Jolene T, NP    Physical Exam    Vital Signs:  Laura Rojas does not have vital signs available for review today.  Given telephonic nature of communication, physical exam is limited. AAOx3. NAD. Normal affect.  Speech and respirations are unlabored.  Accessory Clinical Findings    None  Assessment & Plan    1.  Preoperative Cardiovascular Risk Assessment:   Ms. Eltringham's perioperative risk of a major cardiac event is 6.6% according to the Revised Cardiac Risk Index (RCRI).  Therefore, she is at high risk for perioperative complications.   Her functional capacity is good at 5.62 METs according to the Duke Activity Status Index (DASI). Recommendations: According to ACC/AHA guidelines, no further cardiovascular testing needed.   The patient may proceed to surgery at acceptable risk.   Antiplatelet and/or Anticoagulation Recommendations: The patient should remain on Aspirin without interruption.   Clopidogrel  (Plavix ) can be held for 5 days prior to her surgery and resumed as soon as possible post op.   The patient was advised that if she develops new symptoms prior to surgery to contact our office to arrange for a follow-up visit, and she verbalized understanding.   A copy of this note will be routed to requesting surgeon.  Time:   Today, I have spent 8 minutes with the patient with telehealth technology discussing medical history, symptoms, and management plan.     Laura LOISE Fabry, PA-C  11/10/2024, 12:21 PM      [1]  Allergies Allergen Reactions   Compazine [Prochlorperazine Edisylate] Other (See Comments)    choking   Altace [Ramipril]    Crestor [Rosuvastatin Calcium ] Other (See Comments)    Myalgias   Reglan [Metoclopramide]    Sulfa Antibiotics Other (See Comments)    headache   Actos [Pioglitazone] Other (See Comments)    Hot Flashes   Amlodipine  Swelling   Latex Rash    (Bandaids only )   Lipitor [Atorvastatin ] Other (See Comments)    Cramps

## 2024-12-01 ENCOUNTER — Other Ambulatory Visit: Payer: Self-pay | Admitting: Nurse Practitioner

## 2024-12-01 MED ORDER — VALACYCLOVIR HCL 1 G PO TABS: 1000.0000 mg | ORAL_TABLET | Freq: Two times a day (BID) | ORAL | 3 refills | Status: AC

## 2024-12-01 NOTE — Telephone Encounter (Signed)
 Copied from CRM [id number]. Topic: Clinical - Prescription Issue >> Dec 01, 2024  2:19 PM Wess RAMAN wrote: Reason for CRM: Patient would like a refill of Valacyclovir  1g but it is not listed on current medications  Callback #: [id number]  Pharmacy: [id number] - [place], [place] - [address] [place] [address] Phone: [phone] Fax: [phone] Hours: Not open 24 hours

## 2024-12-02 ENCOUNTER — Other Ambulatory Visit: Payer: Self-pay

## 2024-12-02 MED ORDER — GABAPENTIN 10 % EX CREA: 1.0000 | TOPICAL_CREAM | CUTANEOUS | 2 refills | Status: AC

## 2024-12-02 NOTE — Progress Notes (Signed)
 Patient called requesting refill of [facility] itching cream (SM49 Amitriptyline 5% / Gabapentin  10% / Lidocaine  HCl 5% Cream).  Sent to [facility]

## 2024-12-08 ENCOUNTER — Other Ambulatory Visit: Payer: Self-pay | Admitting: Nurse Practitioner

## 2024-12-08 NOTE — Telephone Encounter (Unsigned)
 Copied from CRM #[id number]. Topic: Clinical - Medication Refill >> Dec 08, 2024  1:46 PM Antony S wrote: Medication: Insulin  Aspart FlexPen (NOVOLOG ) 100 UNIT/ML  She's afraid she will run out before the 22nd   Has the patient contacted their pharmacy? Yes (Agent: If no, request that the patient contact the pharmacy for the refill. If patient does not wish to contact the pharmacy document the reason why and proceed with request.) (Agent: If yes, when and what did the pharmacy advise?)  This is the patient's preferred pharmacy:  [id number] - [place], [place] - [address] [place] [address] Phone: [phone] Fax: [phone]  Is this the correct pharmacy for this prescription? Yes If no, delete pharmacy and type the correct one.   Has the prescription been filled recently? No  Is the patient out of the medication? No  Has the patient been seen for an appointment in the last year OR does the patient have an upcoming appointment? Yes  Can we respond through MyChart? Yes  Agent: Please be advised that Rx refills may take up to 3 business days. We ask that you follow-up with your pharmacy.

## 2024-12-09 ENCOUNTER — Emergency Department
Admission: EM | Admit: 2024-12-09 | Discharge: 2024-12-09 | Disposition: A | Discharge status: Home / Self Care | Attending: Emergency Medicine | Admitting: Emergency Medicine

## 2024-12-09 ENCOUNTER — Ambulatory Visit: Admitting: Family Medicine

## 2024-12-09 ENCOUNTER — Ambulatory Visit: Payer: Self-pay

## 2024-12-09 ENCOUNTER — Emergency Department

## 2024-12-09 DIAGNOSIS — L02811 Cutaneous abscess of head [any part, except face]: Secondary | ICD-10-CM | POA: Insufficient documentation

## 2024-12-09 DIAGNOSIS — Z794 Long term (current) use of insulin: Secondary | ICD-10-CM | POA: Diagnosis not present

## 2024-12-09 DIAGNOSIS — S0990XA Unspecified injury of head, initial encounter: Secondary | ICD-10-CM | POA: Diagnosis present

## 2024-12-09 DIAGNOSIS — Z951 Presence of aortocoronary bypass graft: Secondary | ICD-10-CM | POA: Insufficient documentation

## 2024-12-09 DIAGNOSIS — I2510 Atherosclerotic heart disease of native coronary artery without angina pectoris: Secondary | ICD-10-CM | POA: Diagnosis not present

## 2024-12-09 DIAGNOSIS — E1122 Type 2 diabetes mellitus with diabetic chronic kidney disease: Secondary | ICD-10-CM | POA: Diagnosis not present

## 2024-12-09 DIAGNOSIS — I129 Hypertensive chronic kidney disease with stage 1 through stage 4 chronic kidney disease, or unspecified chronic kidney disease: Secondary | ICD-10-CM | POA: Diagnosis not present

## 2024-12-09 DIAGNOSIS — W228XXA Striking against or struck by other objects, initial encounter: Secondary | ICD-10-CM | POA: Diagnosis not present

## 2024-12-09 DIAGNOSIS — N183 Chronic kidney disease, stage 3 unspecified: Secondary | ICD-10-CM | POA: Insufficient documentation

## 2024-12-09 DIAGNOSIS — Z5189 Encounter for other specified aftercare: Secondary | ICD-10-CM

## 2024-12-09 MED ORDER — LIDOCAINE HCL (PF) 1 % IJ SOLN
5.0000 mL | INTRAMUSCULAR | Status: DC
  Administered 2024-12-09: 5 mL

## 2024-12-09 MED ORDER — LIDOCAINE HCL (PF) 1 % IJ SOLN
5.0000 mL | Freq: Once | INTRAMUSCULAR | Status: AC
  Administered 2024-12-09: 5 mL
  Filled 2024-12-09: qty 5

## 2024-12-09 MED ORDER — INSULIN ASPART FLEXPEN 100 UNIT/ML ~~LOC~~ SOPN: PEN_INJECTOR | SUBCUTANEOUS | 0 refills | Status: AC

## 2024-12-09 MED ORDER — CEPHALEXIN 500 MG PO CAPS: 500.0000 mg | ORAL_CAPSULE | Freq: Four times a day (QID) | ORAL | 0 refills | Status: AC

## 2024-12-09 NOTE — Discharge Instructions (Signed)
 CT scan was normal.  You may take the antibiotics as prescribed for your infection.  Please return for any new, worsening, or changing symptoms or other concerns.  It was a pleasure caring for you today.

## 2024-12-09 NOTE — Telephone Encounter (Signed)
 FYI Only or Action Required?: FYI only for provider: appointment scheduled on today, aware of clinic location.  Patient was last seen in primary care on 10/07/2024 by Valerio Melanie DASEN, NP.  Called Nurse Triage reporting Hair/Scalp Problem.  Symptoms began several weeks ago.  Interventions attempted: OTC medications: hydrocortisone  cream, Rest, hydration, or home remedies, and Ice/heat application.  Symptoms are: gradually worsening.  Triage Disposition: See HCP Within 4 Hours (Or PCP Triage)  Patient/caregiver understands and will follow disposition?: Yes  Reason for Disposition  [1] Raised bruise AND [2] size > 2 inches (5 cm) and getting bigger  Additional Information  Negative: [1] Bruise on head, face, chest, or abdomen AND [2] taking Coumadin (warfarin) or other strong blood thinner, or known bleeding disorder (e.g., thrombocytopenia)    Off since Friday for upcoming procedure  Answer Assessment - Initial Assessment Questions 3 weeks ago in [employer] she hit her head on buggy handle, it was slightly sore right away. Soreness has been increasing since bumping her head. She feels fluid under scalp and is painful to touch, feels like a bruise. She is prescribed anticoagulants but has not taken any since 12/05/24 for upcoming colonoscopy scheduled 12/10/24. Calling today to see if she needs to do anything for treatment and if this could prevent her from procedure tomorrow.  Scheduled acute visit today at alternate [facility] clinic.   1. APPEARANCE of BRUISE: Describe the bruise.      Feels fluid under skin at scalp 2. SIZE: How large is the bruise?      2 inches across  3. NUMBER: How many bruises are there?      one 4. LOCATION: Where is the bruise located?      Side of scalp  5. ONSET: How long ago did the bruise occur?      3 weeks ago 6. CAUSE: What do you think caused the bruise?     Bumped head  7. MEDICAL HISTORY: Do you have any medical problems that can cause  easy bruising or bleeding? (e.g., leukemia, liver disease, recent chemotherapy)     anticoagulated 8. MEDICINES: Do you take any medicines which thin the blood such as: aspirin, apixaban, heparin, ibuprofen (NSAIDS), Plavix , or Coumadin?     yes 9. OTHER SYMPTOMS: Do you have any other symptoms?  (e.g., weakness, dizziness, pain, fever, nosebleed, blood in urine/stool)     Denies all other symptoms  Protocols used: Bruises-A-AH Copied from CRM #[id number]. Topic: Clinical - Red Word Triage >> Dec 09, 2024  9:14 AM Selinda RAMAN wrote: Red Word that prompted transfer to Nurse Triage: The patient called in stating she has a very painful place on her head that there is fluid underneath. She says it hurts no matter what but extremely painful to the touch. She also has a colonoscopy tomorrow and wonders if this may affect that? I will transfer her to E2C2 NT.

## 2024-12-09 NOTE — ED Triage Notes (Signed)
 Pt presents to the ED via POV from home. Pt was sent here due to a wound on the top of her scalp. Pt reports that a couple of weeks ago she raised up and hit her head on the cart handle. Blister like area that is white in color noted to top of scalp. Pt denies fevers or chills. Pt is scheduled for a colonoscopy tomorrow and was sent here for a CT scan of her head.

## 2024-12-09 NOTE — Telephone Encounter (Signed)
 Requested by patient . Future visit 01/20/25. Need new order for future refills.  Requested Prescriptions  Pending Prescriptions Disp Refills   Insulin  Aspart FlexPen (NOVOLOG ) 100 UNIT/ML 45 mL 0    Sig: INJECT 20-25 UNITS INTO THE SKIN 3 (THREE) TIMES DAILY. INJECT PER SLIDING SCALE REGIMEN.     Endocrinology:  Diabetes - Insulins Passed - 12/09/2024  2:00 PM      Passed - HBA1C is between 0 and 7.9 and within 180 days    Hemoglobin A1C  Date Value Ref Range Status  12/31/2019 8.2  Final   HB A1C (BAYER DCA - WAIVED)  Date Value Ref Range Status  10/07/2024 7.0 (H) 4.8 - 5.6 % Final    Comment:             Prediabetes: 5.7 - 6.4          Diabetes: >6.4          Glycemic control for adults with diabetes: <7.0          Passed - Valid encounter within last 6 months    Recent Outpatient Visits           2 months ago Type 2 diabetes mellitus with hyperglycemia, with long-term current use of insulin  (HCC)   [facility] [place], Hector T, NP   5 months ago Type 2 diabetes mellitus with hyperglycemia, with long-term current use of insulin  (HCC)   [facility] [place], Melanie T, NP   7 months ago Allergic reaction, initial encounter   [facility] Melvin Pao, NP   8 months ago Type 2 diabetes mellitus with hyperglycemia, with long-term current use of insulin  [facility])    [facility] Valerio Melanie DASEN, NP       Future Appointments             In 2 months Hester Alm BROCKS, MD [facility]

## 2024-12-09 NOTE — ED Provider Notes (Signed)
 "  [facility] Provider Note    Event Date/Time   First MD Initiated Contact with Patient 12/09/24 1211     (approximate)   History   Wound Check   HPI  Laura Rojas is a 79 y.o. female who presents today for evaluation of pain to the top of her head.  Patient reports that she hit her head approximately 3 weeks ago and has had swelling to the top of her head.  She feels like the area is squishy.  No fevers or chills.  She is anticoagulated but has not been taking her medicine in anticipation of a colonoscopy tomorrow.  No neck pain or paresthesias.  No vomiting.  Patient Active Problem List   Diagnosis Date Noted   Chronic toe pain, right foot 03/19/2024   Osteoporosis 12/25/2023   Insulin  long-term use (HCC) 12/12/2023   Cirrhosis, non-alcoholic (HCC) 02/06/2023   Aortic atherosclerosis 07/30/2022   Umbilical hernia without obstruction and without gangrene 07/30/2022   B12 deficiency 01/11/2021   Long-term current use of benzodiazepine 03/17/2020   Gastroesophageal reflux disease without esophagitis 12/17/2019   Type 2 diabetes mellitus with hyperglycemia, with long-term current use of insulin  (HCC) 11/09/2019   Rheumatoid arthritis of multiple sites with negative rheumatoid factor (HCC) 10/20/2019   Polyp of transverse colon    Coronary artery disease of bypass graft of native heart with stable angina pectoris 07/17/2019   Depression, recurrent 10/15/2018   Polymyalgia rheumatica 05/15/2018   Restless leg syndrome 10/16/2017   Advanced care planning/counseling discussion 08/23/2017   Insomnia 05/24/2017   CKD stage 3 due to type 2 diabetes mellitus (HCC) 04/13/2016   Fe deficiency anemia 12/01/2015   Anxiety disorder 05/26/2015   Subclavian artery stenosis, left 01/01/2015   Hypertension associated with diabetes (HCC) 12/31/2013   Hyperlipidemia associated with type 2 diabetes mellitus (HCC) 12/31/2013          Physical Exam   Triage Vital  Signs: ED Triage Vitals  Encounter Vitals Group     BP 12/09/24 1133 (!) 158/100     Girls Systolic BP Percentile --      Girls Diastolic BP Percentile --      Boys Systolic BP Percentile --      Boys Diastolic BP Percentile --      Pulse Rate 12/09/24 1133 67     Resp 12/09/24 1133 18     Temp 12/09/24 1133 98.2 F (36.8 C)     Temp Source 12/09/24 1133 Oral     SpO2 12/09/24 1133 96 %     Weight 12/09/24 1134 160 lb (72.6 kg)     Height 12/09/24 1134 5' 5 (1.651 m)     Head Circumference --      Peak Flow --      Pain Score 12/09/24 1134 5     Pain Loc --      Pain Education --      Exclude from Growth Chart --     Most recent vital signs: Vitals:   12/09/24 1133  BP: (!) 158/100  Pulse: 67  Resp: 18  Temp: 98.2 F (36.8 C)  SpO2: 96%    Physical Exam Vitals and nursing note reviewed.  Constitutional:      General: Awake and alert. No acute distress.    Appearance: Normal appearance. The patient is normal weight.  HENT:     Head: Normocephalic and atraumatic.  There is a 1 to 2 cm area of fluctuance  at the top of the head and purulent appearing.  No open wounds.  Not draining    Mouth: Mucous membranes are moist.  Eyes:     General: PERRL. Normal EOMs        Right eye: No discharge.        Left eye: No discharge.     Conjunctiva/sclera: Conjunctivae normal.  Cardiovascular:     Rate and Rhythm: Normal rate and regular rhythm.     Pulses: Normal pulses.  Pulmonary:     Effort: Pulmonary effort is normal. No respiratory distress.     Breath sounds: Normal breath sounds.  Abdominal:     Abdomen is soft. There is no abdominal tenderness. No rebound or guarding. No distention. Musculoskeletal:        General: No swelling. Normal range of motion.     Cervical back: Normal range of motion and neck supple. No midline cervical spine tenderness.  Full range of motion of neck.  Negative Spurling test.  Negative Lhermitte sign.  Normal strength and sensation in  bilateral upper extremities. Normal grip strength bilaterally.  Normal intrinsic muscle function of the hand bilaterally.  Normal radial pulses bilaterally. Skin:    General: Skin is warm and dry.     Capillary Refill: Capillary refill takes less than 2 seconds.     Findings: No rash.  Neurological:     Mental Status: The patient is awake and alert.   Neurological: GCS 15 alert and oriented x3 Normal speech, no expressive or receptive aphasia or dysarthria Cranial nerves II through XII intact Normal visual fields 5 out of 5 strength in all 4 extremities with intact sensation throughout No extremity drift Normal finger-to-nose testing, no limb or truncal ataxia    ED Results / Procedures / Treatments   Labs (all labs ordered are listed, but only abnormal results are displayed) Labs Reviewed - No data to display   EKG     RADIOLOGY I independently reviewed and interpreted imaging and agree with radiologists findings.     PROCEDURES:  Critical Care performed:   .Incision and Drainage  Date/Time: 12/09/2024 1:34 PM  Performed by: Genoa Freyre E, PA-C Authorized by: Genny Caulder E, PA-C   Consent:    Consent obtained:  Verbal   Consent given by:  Patient   Risks, benefits, and alternatives were discussed: yes     Risks discussed:  Bleeding, damage to other organs, incomplete drainage, infection and pain   Alternatives discussed:  No treatment Universal protocol:    Procedure explained and questions answered to patient or proxy's satisfaction: yes     Relevant documents present and verified: yes     Test results available : yes     Imaging studies available: yes     Required blood products, implants, devices, and special equipment available: yes     Site/side marked: yes     Immediately prior to procedure, a time out was called: yes     Patient identity confirmed:  Verbally with patient Location:    Type:  Abscess   Location:  Head   Head location:   Scalp Pre-procedure details:    Skin preparation:  Antiseptic wash Sedation:    Sedation type:  None Anesthesia:    Anesthesia method:  Local infiltration   Local anesthetic:  5 mL lidocaine  (PF) 1 % Procedure type:    Complexity:  Simple Procedure details:    Ultrasound guidance: no     Needle aspiration: no  Incision types:  Stab incision   Incision depth:  Dermal   Wound management:  Debrided   Drainage:  Serous   Drainage amount:  Scant   Wound treatment:  Wound left open   Packing materials:  None Post-procedure details:    Procedure completion:  Tolerated well, no immediate complications    MEDICATIONS ORDERED IN ED: Medications  lidocaine  (PF) (XYLOCAINE ) 1 % injection 5 mL (5 mLs Infiltration Given 12/09/24 1345)     IMPRESSION / MDM / ASSESSMENT AND PLAN / ED COURSE  I reviewed the triage vital signs and the nursing notes.   Differential diagnosis includes, but is not limited to, cellulitis, abscess, intracranial hemorrhage.  Patient is awake and alert, hemodynamically stable and neurologically intact.  She has a small fluctuant area on the top of her head consistent with abscess.  I do not feel that she needs any head imaging given that this injury occurred 2 weeks ago and she has not had any focal neurological deficits or vomiting, however patient reports that she was sent in for CAT scan, therefore CAT scan of her head obtained.  No cervical spine tenderness, normal strength and sensation bilateral upper extremities, I do not feel she needs a CT neck given that this was not a fall.  Area on top of her head was injected with lidocaine , small incision made.  There was no purulent drainage.  Whitened skin appeared to just be dead skin.  Skin underneath is erythematous, will treat for cellulitis.  Patient was started on antibiotics.  We discussed return precautions and outpatient follow-up.  Patient understands and agrees with plan.  She was discharged in stable  condition.   Patient's presentation is most consistent with acute complicated illness / injury requiring diagnostic workup.     FINAL CLINICAL IMPRESSION(S) / ED DIAGNOSES   Final diagnoses:  Visit for wound check  Injury of head, initial encounter     Rx / DC Orders   ED Discharge Orders          Ordered    cephALEXin  (KEFLEX ) 500 MG capsule  4 times daily        12/09/24 1334             Note:  This document was prepared using Dragon voice recognition software and may include unintentional dictation errors.   [name] Salvador E, PA-C 12/09/24 1347    Bradler, Evan K, MD 12/09/24 1530  "

## 2024-12-10 ENCOUNTER — Ambulatory Visit
Admission: RE | Admit: 2024-12-10 | Discharge: 2024-12-10 | Disposition: A | Discharge status: Home / Self Care | Attending: Internal Medicine | Admitting: Internal Medicine

## 2024-12-10 ENCOUNTER — Encounter
Admission: RE | Disposition: A | Discharge status: Home / Self Care | Payer: Self-pay | Source: Home / Self Care | Attending: Internal Medicine

## 2024-12-10 ENCOUNTER — Ambulatory Visit: Admitting: Anesthesiology

## 2024-12-10 DIAGNOSIS — Z955 Presence of coronary angioplasty implant and graft: Secondary | ICD-10-CM | POA: Diagnosis not present

## 2024-12-10 DIAGNOSIS — D125 Benign neoplasm of sigmoid colon: Secondary | ICD-10-CM | POA: Insufficient documentation

## 2024-12-10 DIAGNOSIS — K640 First degree hemorrhoids: Secondary | ICD-10-CM | POA: Insufficient documentation

## 2024-12-10 DIAGNOSIS — Z7984 Long term (current) use of oral hypoglycemic drugs: Secondary | ICD-10-CM | POA: Insufficient documentation

## 2024-12-10 DIAGNOSIS — Z794 Long term (current) use of insulin: Secondary | ICD-10-CM | POA: Diagnosis not present

## 2024-12-10 DIAGNOSIS — Z1211 Encounter for screening for malignant neoplasm of colon: Secondary | ICD-10-CM | POA: Diagnosis not present

## 2024-12-10 DIAGNOSIS — I2510 Atherosclerotic heart disease of native coronary artery without angina pectoris: Secondary | ICD-10-CM | POA: Insufficient documentation

## 2024-12-10 DIAGNOSIS — Z860101 Personal history of adenomatous and serrated colon polyps: Secondary | ICD-10-CM | POA: Diagnosis present

## 2024-12-10 DIAGNOSIS — E1051 Type 1 diabetes mellitus with diabetic peripheral angiopathy without gangrene: Secondary | ICD-10-CM | POA: Diagnosis not present

## 2024-12-10 DIAGNOSIS — F419 Anxiety disorder, unspecified: Secondary | ICD-10-CM | POA: Diagnosis not present

## 2024-12-10 DIAGNOSIS — I10 Essential (primary) hypertension: Secondary | ICD-10-CM | POA: Diagnosis not present

## 2024-12-10 DIAGNOSIS — K219 Gastro-esophageal reflux disease without esophagitis: Secondary | ICD-10-CM | POA: Insufficient documentation

## 2024-12-10 DIAGNOSIS — K5730 Diverticulosis of large intestine without perforation or abscess without bleeding: Secondary | ICD-10-CM | POA: Insufficient documentation

## 2024-12-10 HISTORY — PX: COLONOSCOPY: SHX5424

## 2024-12-10 HISTORY — PX: POLYPECTOMY: SHX149

## 2024-12-10 LAB — GLUCOSE, CAPILLARY: Glucose-Capillary: 160 mg/dL — ABNORMAL HIGH (ref 70–99)

## 2024-12-10 MED ORDER — PROPOFOL 500 MG/50ML IV EMUL
INTRAVENOUS | Status: DC | PRN
  Administered 2024-12-10: 50 ug/kg/min via INTRAVENOUS

## 2024-12-10 MED ORDER — SODIUM CHLORIDE 0.9 % IV SOLN
INTRAVENOUS | Status: DC
  Administered 2024-12-10: 20 mL/h via INTRAVENOUS

## 2024-12-10 MED ORDER — LIDOCAINE HCL (PF) 2 % IJ SOLN
INTRAMUSCULAR | Status: AC
  Filled 2024-12-10: qty 5

## 2024-12-10 MED ORDER — LIDOCAINE HCL (CARDIAC) PF 100 MG/5ML IV SOSY
PREFILLED_SYRINGE | INTRAVENOUS | Status: DC | PRN
  Administered 2024-12-10: 60 mg via INTRAVENOUS

## 2024-12-10 MED ORDER — PROPOFOL 10 MG/ML IV BOLUS
INTRAVENOUS | Status: DC | PRN
  Administered 2024-12-10: 20 mg via INTRAVENOUS
  Administered 2024-12-10: 50 mg via INTRAVENOUS
  Administered 2024-12-10: 30 mg via INTRAVENOUS

## 2024-12-10 NOTE — Anesthesia Postprocedure Evaluation (Signed)
"   Anesthesia Post Note  Patient: Laura Rojas  Procedure(s) Performed: COLONOSCOPY POLYPECTOMY, INTESTINE  Patient location during evaluation: PACU Anesthesia Type: General Level of consciousness: awake and alert Pain management: satisfactory to patient Vital Signs Assessment: post-procedure vital signs reviewed and stable Respiratory status: spontaneous breathing Cardiovascular status: stable Anesthetic complications: no   No notable events documented.   Last Vitals:  Vitals:   12/10/24 0930 12/10/24 0940  BP: (!) 144/59 (!) 145/63  Pulse: 63   Resp: (!) 22 14  Temp:    SpO2: 99% 98%    Last Pain:  Vitals:   12/10/24 0940  TempSrc:   PainSc: 0-No pain                 VAN STAVEREN,Taniah Reinecke      "

## 2024-12-10 NOTE — Op Note (Signed)
 [facility] Patient Name: Franchesca Veneziano Procedure Date: 12/10/2024 8:40 AM MRN: [id number] Account #: [id number] Date of Birth: 1946-11-21 Admit Type: Outpatient Age: 79 Room: [facility] [address] ENDO ROOM 2 Gender: Female Note Status: Finalized Instrument Name: Colon Scope [id number] Procedure:             Colonoscopy Indications:           High risk colon cancer surveillance: Personal history                         of non-advanced adenoma Providers:             Linsie Lupo K. Aundria MD, MD Referring MD:          Melanie DASEN. Cannady (Referring MD) Medicines:             Propofol  per Anesthesia Complications:         No immediate complications. Estimated blood loss:                         Minimal. Procedure:             Pre-Anesthesia Assessment:                        - The risks and benefits of the procedure and the                         sedation options and risks were discussed with the                         patient. All questions were answered and informed                         consent was obtained.                        - Patient identification and proposed procedure were                         verified prior to the procedure by the nurse. The                         procedure was verified in the procedure room.                        - ASA Grade Assessment: III - A patient with severe                         systemic disease.                        - After reviewing the risks and benefits, the patient                         was deemed in satisfactory condition to undergo the                         procedure.                        After obtaining informed consent, the  colonoscope was                         passed under direct vision. Throughout the procedure,                         the patient's blood pressure, pulse, and oxygen                         saturations were monitored continuously. The                         Colonoscope was introduced  through the anus and                         advanced to the the cecum, identified by appendiceal                         orifice and ileocecal valve. The colonoscopy was                         technically difficult and complex due to multiple                         diverticula in the colon. Successful completion of the                         procedure was aided by withdrawing the scope and                         replacing with the pediatric colonoscope. The patient                         tolerated the procedure well. The quality of the bowel                         preparation was good. The ileocecal valve, appendiceal                         orifice, and rectum were photographed. Findings:      The perianal and digital rectal examinations were normal. Pertinent       negatives include normal sphincter tone and no palpable rectal lesions.      Non-bleeding internal hemorrhoids were found during retroflexion. The       hemorrhoids were Grade I (internal hemorrhoids that do not prolapse).      Multiple large-mouthed and small-mouthed diverticula were found in the       sigmoid colon. There was no evidence of diverticular bleeding. Estimated       blood loss: none.      A 4 mm polyp was found in the sigmoid colon. The polyp was sessile. The       polyp was removed with a piecemeal technique using a cold biopsy       forceps. Resection and retrieval were complete. Estimated blood loss was       minimal.      The exam was otherwise without abnormality. Impression:            - Non-bleeding internal hemorrhoids.                        -  Severe diverticulosis in the sigmoid colon. There                         was no evidence of diverticular bleeding.                        - One 4 mm polyp in the sigmoid colon, removed                         piecemeal using a cold biopsy forceps. Resected and                         retrieved.                        - The examination was otherwise  normal. Recommendation:        - Patient has a contact number available for                         emergencies. The signs and symptoms of potential                         delayed complications were discussed with the patient.                         Return to normal activities tomorrow. Written                         discharge instructions were provided to the patient.                        - High fiber diet.                        - Continue present medications.                        - Await pathology results.                        - If polyps are benign or adenomatous without                         dysplasia, I will advise NO further colonoscopy due to                         advanced age and/or severe comorbidity.                        - Return to GI office in 6 months.                        - Telephone GI clinic to schedule appointment. Procedure Code(s):     --- Professional ---                        [phone], Colonoscopy, flexible; with biopsy, single or                         multiple Diagnosis Code(s):     ---  Professional ---                        K57.30, Diverticulosis of large intestine without                         perforation or abscess without bleeding                        D12.5, Benign neoplasm of sigmoid colon                        K64.0, First degree hemorrhoids                        Z86.010, Personal history of colonic polyps CPT copyright 2022 [employer]. All rights reserved. The codes documented in this report are preliminary and upon coder review may  be revised to meet current compliance requirements. Ladell MARLA Boss MD, MD 12/10/2024 9:23:15 AM This report has been signed electronically. Number of Addenda: 0 Note Initiated On: 12/10/2024 8:40 AM Scope Withdrawal Time: 0 hours 6 minutes 55 seconds  Total Procedure Duration: 0 hours 18 minutes 45 seconds  Estimated Blood Loss:  Estimated blood loss was minimal.      [facility]

## 2024-12-10 NOTE — Anesthesia Preprocedure Evaluation (Signed)
 "                                  Anesthesia Evaluation  Patient identified by MRN, date of birth, ID band Patient awake    Reviewed: Allergy & Precautions, NPO status , Patient's Chart, lab work & pertinent test results  Airway Mallampati: II  TM Distance: >3 FB Neck ROM: Full    Dental  (+) Teeth Intact   Pulmonary neg pulmonary ROS   Pulmonary exam normal        Cardiovascular Exercise Tolerance: Good hypertension, Pt. on medications + CAD, + Cardiac Stents and + Peripheral Vascular Disease  negative cardio ROS Normal cardiovascular exam Rhythm:Regular     Neuro/Psych   Anxiety     negative neurological ROS  negative psych ROS   GI/Hepatic negative GI ROS, Neg liver ROS,GERD  Medicated,,  Endo/Other  negative endocrine ROSdiabetes, Well Controlled, Type 1, Insulin  Dependent    Renal/GU negative Renal ROS  negative genitourinary   Musculoskeletal   Abdominal   Peds negative pediatric ROS (+)  Hematology  (+) Blood dyscrasia, anemia   Anesthesia Other Findings Past Medical History: No date: Arthritis     Comment:  Shoulders, hands, feet 12/31/2013: CAD (coronary artery disease)     Comment:  2007, 3.0 x 28 mm drug-eluting Cypher stent to RCA, 3.5               x 33 mm drug-eluting Cypher stent and proximal LAD)               Consent by 2009 Normal perfusion by mildly 2013  No date: Diabetes mellitus without complication (HCC) No date: Dyslipidemia No date: GERD (gastroesophageal reflux disease) No date: Hypertension No date: Polymyalgia rheumatica syndrome 01/01/2015: Subclavian artery stenosis, left  Past Surgical History: 1970: BREAST EXCISIONAL BIOPSY; Right     Comment:  EXCISIONAL - NEG 1973: BREAST EXCISIONAL BIOPSY; Left     Comment:  EXCISIONAL - NEG No date: BREAST SURGERY 08/25/2008: CARDIAC CATHETERIZATION     Comment:  patent stents 10/2006: CAROTID DUPLEX     Comment:  NORMAL CAROTID ARTERY DUPLEX 04/13/2020: CATARACT  EXTRACTION W/PHACO; Left     Comment:  Procedure: CATARACT EXTRACTION PHACO AND INTRAOCULAR               LENS PLACEMENT (IOC) LEFT DIABETIC 4.69  00:44.0;                Surgeon: Jaye Fallow, MD;  Location: [facility]              [facility];  Service: Ophthalmology;  Laterality: Left;                Diabetic - insulin  and oral meds 05/04/2020: CATARACT EXTRACTION W/PHACO; Right     Comment:  Procedure: CATARACT EXTRACTION PHACO AND INTRAOCULAR               LENS PLACEMENT (IOC) RIGHT DIABETIC 3.92  00:27.1;                Surgeon: Jaye Fallow, MD;  Location: [facility]              [facility];  Service: Ophthalmology;  Laterality: Right;                Diabetic - insulin  and oral meds 08/19/2019: COLONOSCOPY WITH PROPOFOL ; N/A     Comment:  Procedure: COLONOSCOPY WITH  PROPOFOL ;  Surgeon: Jinny Carmine, MD;  Location: [facility] ENDOSCOPY;  Service:               Endoscopy;  Laterality: N/A; 09/17/2006: CORONARY ANGIOPLASTY WITH STENT PLACEMENT     Comment:  PTCA & stenting LAD 04/27/2015: ESOPHAGOGASTRODUODENOSCOPY; N/A     Comment:  Procedure: ESOPHAGOGASTRODUODENOSCOPY (EGD);  Surgeon:               Carmine Jinny, MD;  Location: [facility] ENDOSCOPY;  Service:               Endoscopy;  Laterality: N/A; No date: JOINT REPLACEMENT 11/2011: NM MYOCAR MULTIPLE W/SPECT     Comment:  EF 72%.NORMAL MYOCARDIAL PERFUSION STUDY 2003 & 2004: REPLACEMENT TOTAL KNEE 09/2006: TRANSTHORACIC ECHOCARDIOGRAM     Comment:  EF =>55%. IMPAIRED LV RELAXATION. MILD MITRAL ANNULAR               CALCIFICATION. AV-MILDLY SCLEROTIC. NO AS. 1983: VAGINAL HYSTERECTOMY  BMI    Body Mass Index: 25.76 kg/m      Reproductive/Obstetrics negative OB ROS                              Anesthesia Physical Anesthesia Plan  ASA: 3  Anesthesia Plan: General   Post-op Pain Management:    Induction: Intravenous  PONV Risk Score and Plan: Propofol  infusion and TIVA  Airway  Management Planned: Natural Airway and Nasal Cannula  Additional Equipment:   Intra-op Plan:   Post-operative Plan:   Informed Consent: I have reviewed the patients History and Physical, chart, labs and discussed the procedure including the risks, benefits and alternatives for the proposed anesthesia with the patient or authorized representative who has indicated his/her understanding and acceptance.     Dental Advisory Given  Plan Discussed with: CRNA  Anesthesia Plan Comments:         Anesthesia Quick Evaluation  "

## 2024-12-10 NOTE — Interval H&P Note (Signed)
 History and Physical Interval Note:  12/10/2024 8:43 AM  Laura Rojas  has presented today for surgery, with the diagnosis of Hx of adenomatous colonic polyps.  The various methods of treatment have been discussed with the patient and family. After consideration of risks, benefits and other options for treatment, the patient has consented to  Procedures: COLONOSCOPY (N/A) as a surgical intervention.  The patient's history has been reviewed, patient examined, no change in status, stable for surgery.  I have reviewed the patient's chart and labs.  Questions were answered to the patient's satisfaction.     Bartelso, Daryn Hicks

## 2024-12-10 NOTE — Transfer of Care (Signed)
 Immediate Anesthesia Transfer of Care Note  Patient: Laura Rojas  Procedure(s) Performed: COLONOSCOPY POLYPECTOMY, INTESTINE  Patient Location: PACU  Anesthesia Type:General  Level of Consciousness: sedated  Airway & Oxygen Therapy: Patient Spontanous Breathing  Post-op Assessment: Report given to RN and Post -op Vital signs reviewed and stable  Post vital signs: Reviewed and stable  Last Vitals:  Vitals Value Taken Time  BP 125/45 12/10/24 09:24  Temp    Pulse 64 12/10/24 09:24  Resp 19 12/10/24 09:24  SpO2 99 % 12/10/24 09:24  Vitals shown include unfiled device data.  Last Pain:  Vitals:   12/10/24 0828  TempSrc: Temporal  PainSc: 10-Worst pain ever         Complications: No notable events documented.

## 2024-12-10 NOTE — H&P (Signed)
 Outpatient short stay form Pre-procedure 12/10/2024 8:42 AM Laura Rojas K. Aundria, M.D.  Primary Physician: [name], NP  Reason for visit:  Hx of adenomatous collon polyps  History of present illness:  Summary of evaluation: - Labs: - CSY: August 19, 2019-Dr. Wohl-nonbleeding internal hemorrhoids. Impression: - Four 4 to 7 mm polyps in the transverse colon,  removed with a cold snare. Resected and retrieved. Clip  (MR conditional) was placed. Pathologic diagnosis-multiple fragments of tubular adenoma - One 4 mm polyp in the sigmoid colon, removed with a  cold snare. Resected and retrieved.   She has a history of constipation previously associated with significant side pain, but she has not experienced constipation or associated pain since her last visit. She has not made any dietary changes or started new laxatives.  She experiences mild fecal incontinence, noting unexpected fecal presence when wiping after urination, which she describes as 'gross' and unexpected. She does not feel it happening and has not noticed other related symptoms.  Her diabetes is described as fluctuating, with levels going 'up and down'.  No sore throat or cough, but she reports frequent heavy coughing, noticeable to others, such as during church services.  Current Medications[1]  Medications Prior to Admission  Medication Sig Dispense Refill Last Dose/Taking   acetaminophen  (TYLENOL ) 500 MG tablet Take 500 mg by mouth 2 (two) times daily. For arthritis pain   Past Week   amLODipine  (NORVASC ) 2.5 MG tablet Take 1 tablet (2.5 mg total) by mouth daily. 90 tablet 4 12/09/2024   aspirin 81 MG tablet Take 81 mg by mouth daily.   Past Week   atorvastatin  (LIPITOR) 40 MG tablet TAKE 1 TABLET BY MOUTH EVERY DAY** STOP THE SIMVASTATIN  90 tablet 4 12/09/2024   clopidogrel  (PLAVIX ) 75 MG tablet Take 1 tablet (75 mg total) by mouth daily. 90 tablet 4 Past Week   Continuous Blood Gluc Sensor (FREESTYLE LIBRE 14 DAY  SENSOR) MISC USE 1 KIT EVERY 14 (FOURTEEN) DAYS 2 each 5 12/09/2024   Gabapentin  10 % CREA Apply 1 Application topically as directed. Bid aa itching on legs prn flares, for atopic dermatitis with prurigo nodularis 30 g 2 Past Month   hydrochlorothiazide  (MICROZIDE ) 12.5 MG capsule TAKE 1 CAPSULE BY MOUTH EVERY DAY 90 capsule 4 12/09/2024   Insulin  Aspart FlexPen (NOVOLOG ) 100 UNIT/ML INJECT 20-25 UNITS INTO THE SKIN 3 (THREE) TIMES DAILY. INJECT PER SLIDING SCALE REGIMEN. 45 mL 0 12/09/2024   Insulin  Glargine (BASAGLAR  KWIKPEN) 100 UNIT/ML Inject 16 Units into the skin at bedtime. 15 mL 4 12/09/2024   Insulin  Pen Needle (BD PEN NEEDLE NANO 2ND GEN) 32G X 4 MM MISC To use while injecting insulin  4 times a day. 400 each 3 12/09/2024   LORazepam  (ATIVAN ) 0.5 MG tablet TAKE 1 TAB BY MOUTH TWICE A DAY (USE THE ADDITIONAL 30 TABS ONLY SPARINGLY & ONLY FOR SEVERE ANXIETY) 180 tablet 0 12/09/2024   losartan  (COZAAR ) 100 MG tablet Take 1 tablet (100 mg total) by mouth daily. 90 tablet 4 12/09/2024   Magnesium 400 MG TABS Take 1 tablet by mouth daily.   Past Week   Multiple Vitamin (MULTIVITAMIN) tablet Take 1 tablet by mouth daily.   Past Week   Omega-3 Fatty Acids (FISH OIL) 1200 MG CAPS Take 1,200 mg by mouth daily.   Past Week   pantoprazole  (PROTONIX ) 40 MG tablet Take 1 tablet (40 mg total) by mouth daily. 90 tablet 3 Past Week   sertraline  (ZOLOFT ) 100 MG tablet TAKE 1.5  TABLETS BY MOUTH DAILY 135 tablet 4 12/09/2024   valACYclovir  (VALTREX ) 1000 MG tablet Take 1 tablet (1,000 mg total) by mouth 2 (two) times daily. 180 tablet 3 Past Week   vitamin B-12 (CYANOCOBALAMIN ) 1000 MCG tablet Take 1,000 mcg by mouth daily.   Past Week   VITRON-C 65-125 MG TABS TAKE 1 TABLET BY MOUTH EVERY DAY 60 tablet 7 Past Week   alendronate  (FOSAMAX ) 70 MG tablet Take 70 mg by mouth once a week. (Patient not taking: Reported on 10/21/2024)      cephALEXin  (KEFLEX ) 500 MG capsule Take 1 capsule (500 mg total) by mouth 4 (four)  times daily for 7 days. 28 capsule 0    certolizumab pegol  (CIMZIA ) 2 X 200 MG/ML PSKT Inject into the skin.      cholecalciferol (VITAMIN D3) 25 MCG (1000 UT) tablet Take 1,000 Units by mouth daily.      metFORMIN  (GLUCOPHAGE ) 1000 MG tablet Take 0.5 tablets (500 mg total) by mouth 2 (two) times daily. (Patient not taking: Reported on 10/21/2024) 90 tablet 4      Allergies[2]   Past Medical History:  Diagnosis Date   Arthritis    Shoulders, hands, feet   CAD (coronary artery disease) 12/31/2013   2007, 3.0 x 28 mm drug-eluting Cypher stent to RCA, 3.5 x 33 mm drug-eluting Cypher stent and proximal LAD) Consent by 2009 Normal perfusion by mildly 2013    Diabetes mellitus without complication (HCC)    Dyslipidemia    GERD (gastroesophageal reflux disease)    Hypertension    Polymyalgia rheumatica syndrome    Subclavian artery stenosis, left 01/01/2015    Review of systems:  Otherwise negative.    Physical Exam  Gen: Alert, oriented. Appears stated age.  HEENT: Bone Gap/AT. PERRLA. Lungs: CTA, no wheezes. CV: RR nl S1, S2. Abd: soft, benign, no masses. BS+ Ext: No edema. Pulses 2+    Planned procedures: Proceed with colonoscopy. The patient understands the nature of the planned procedure, indications, risks, alternatives and potential complications including but not limited to bleeding, infection, perforation, damage to internal organs and possible oversedation/side effects from anesthesia. The patient agrees and gives consent to proceed.  Please refer to procedure notes for findings, recommendations and patient disposition/instructions.     Miriya Cloer K. Aundria, M.D. Gastroenterology 12/10/2024  8:42 AM          [1]  Current Facility-Administered Medications:    0.9 %  sodium chloride  infusion, , Intravenous, Continuous, Neomi Laidler K, MD [2]  Allergies Allergen Reactions   Compazine [Prochlorperazine Edisylate] Other (See Comments)    choking   Altace [Ramipril]     Crestor [Rosuvastatin Calcium ] Other (See Comments)    Myalgias   Reglan [Metoclopramide]    Sulfa Antibiotics Other (See Comments)    headache   Actos [Pioglitazone] Other (See Comments)    Hot Flashes   Amlodipine  Swelling   Latex Rash    (Bandaids only )   Lipitor [Atorvastatin ] Other (See Comments)    Cramps

## 2024-12-11 ENCOUNTER — Encounter: Payer: Self-pay | Admitting: Internal Medicine

## 2024-12-11 LAB — SURGICAL PATHOLOGY

## 2024-12-16 ENCOUNTER — Other Ambulatory Visit: Payer: Self-pay | Admitting: Nurse Practitioner

## 2024-12-16 DIAGNOSIS — E1159 Type 2 diabetes mellitus with other circulatory complications: Secondary | ICD-10-CM

## 2024-12-16 DIAGNOSIS — I25708 Atherosclerosis of coronary artery bypass graft(s), unspecified, with other forms of angina pectoris: Secondary | ICD-10-CM

## 2024-12-16 NOTE — Telephone Encounter (Signed)
 Requested Prescriptions  Pending Prescriptions Disp Refills   atorvastatin  (LIPITOR) 40 MG tablet [Pharmacy Med Name: ATORVASTATIN  40 MG TABLET] 90 tablet 2    Sig: TAKE 1 TABLET BY MOUTH EVERY DAY** STOP THE SIMVASTATIN      Cardiovascular:  Antilipid - Statins Failed - 12/16/2024  5:25 PM      Failed - Lipid Panel in normal range within the last 12 months    Cholesterol, Total  Date Value Ref Range Status  10/07/2024 156 100 - 199 mg/dL Final   Cholesterol Piccolo, Waived  Date Value Ref Range Status  05/08/2019 170 <200 mg/dL Final    Comment:                            Desirable                <200                         Borderline High      200- 239                         High                     >239    LDL Chol Calc (NIH)  Date Value Ref Range Status  10/07/2024 73 0 - 99 mg/dL Final   HDL  Date Value Ref Range Status  10/07/2024 59 >39 mg/dL Final   Triglycerides  Date Value Ref Range Status  10/07/2024 137 0 - 149 mg/dL Final   Triglycerides Piccolo,Waived  Date Value Ref Range Status  05/08/2019 367 (H) <150 mg/dL Final    Comment:                            Normal                   <150                         Borderline High     150 - 199                         High                200 - 499                         Very High                >499          Passed - Patient is not pregnant      Passed - Valid encounter within last 12 months    Recent Outpatient Visits           2 months ago Type 2 diabetes mellitus with hyperglycemia, with long-term current use of insulin  (HCC)   [place] [facility] [place], [place] T, NP   5 months ago Type 2 diabetes mellitus with hyperglycemia, with long-term current use of insulin  (HCC)   [place] [facility] [place], Melanie T, NP   7 months ago Allergic reaction, initial encounter   [place] [facility] Melvin Pao, NP   9 months ago Type 2 diabetes mellitus  with hyperglycemia, with long-term current use of insulin  [facility])   [place] [facility] Valerio Melanie DASEN, NP       Future Appointments             In 2 months Hester Alm BROCKS, MD [facility]   In 3 months Croitoru, Jerel, MD [facility] at [facility] A Dept of The [place]. [facility], H&[facility]             pantoprazole  (PROTONIX ) 40 MG tablet [Pharmacy Med Name: PANTOPRAZOLE  SOD DR 40 MG TAB] 90 tablet 2    Sig: TAKE 1 TABLET BY MOUTH EVERY DAY     Gastroenterology: Proton Pump Inhibitors Passed - 12/16/2024  5:25 PM      Passed - Valid encounter within last 12 months    Recent Outpatient Visits           2 months ago Type 2 diabetes mellitus with hyperglycemia, with long-term current use of insulin  (HCC)   [place] [facility] [place], [place] T, NP   5 months ago Type 2 diabetes mellitus with hyperglycemia, with long-term current use of insulin  (HCC)   [place] [facility] [place], Melanie T, NP   7 months ago Allergic reaction, initial encounter   [place] [facility] Melvin Pao, NP   9 months ago Type 2 diabetes mellitus with hyperglycemia, with long-term current use of insulin  [facility])   [place] [facility] Valerio Melanie DASEN, NP       Future Appointments             In 2 months Hester Alm BROCKS, MD [facility]   In 3 months Croitoru, Jerel, MD [facility] at [facility] A Dept of The [place]. [facility], H&V             losartan  (COZAAR ) 100 MG tablet [Pharmacy Med Name: LOSARTAN  POTASSIUM 100 MG TAB] 90 tablet 1    Sig: TAKE 1 TABLET BY MOUTH EVERY DAY     Cardiovascular:  Angiotensin Receptor Blockers Failed - 12/16/2024  5:25 PM      Failed - Last BP in normal range    BP Readings from Last 1 Encounters:  12/10/24 (!) 145/63         Passed - Cr in normal range and within 180 days    Creatinine  Date Value Ref Range Status   06/08/2023 131.6 20.0 - 300.0 mg/dL Final   Creatinine, Ser  Date Value Ref Range Status  10/07/2024 0.94 0.57 - 1.00 mg/dL Final         Passed - K in normal range and within 180 days    Potassium  Date Value Ref Range Status  10/07/2024 3.8 3.5 - 5.2 mmol/L Final         Passed - Patient is not pregnant      Passed - Valid encounter within last 6 months    Recent Outpatient Visits           2 months ago Type 2 diabetes mellitus with hyperglycemia, with long-term current use of insulin  (HCC)   [place] [facility] [place], [place] T, NP   5 months ago Type 2 diabetes mellitus with hyperglycemia, with long-term current use of insulin  (HCC)   [place] [facility] [place], [place] T, NP   7 months ago Allergic reaction, initial encounter   [place] [facility] Melvin Pao, NP   9 months ago Type 2 diabetes mellitus with hyperglycemia, with long-term current use of insulin  [facility])   [facility] Valerio Melanie DASEN, NP       Future Appointments             In 2 months Hester Alm BROCKS, MD [facility]   In 3 months Croitoru, Jerel, MD [facility]Care at [facility] A Dept of The [place]. [facility], H&V

## 2024-12-20 ENCOUNTER — Other Ambulatory Visit: Payer: Self-pay | Admitting: Nurse Practitioner

## 2024-12-20 DIAGNOSIS — I25708 Atherosclerosis of coronary artery bypass graft(s), unspecified, with other forms of angina pectoris: Secondary | ICD-10-CM

## 2024-12-22 NOTE — Telephone Encounter (Signed)
 Requested Prescriptions  Pending Prescriptions Disp Refills   sertraline  (ZOLOFT ) 100 MG tablet [Pharmacy Med Name: SERTRALINE  HCL 100 MG TABLET] 135 tablet 0    Sig: TAKE 1 AND 1/2 TABLETS DAILY BY MOUTH     Psychiatry:  Antidepressants - SSRI - sertraline  Passed - 12/22/2024 11:04 AM      Passed - AST in normal range and within 360 days    AST  Date Value Ref Range Status  07/01/2024 23 0 - 40 IU/L Final   AST (SGOT) Piccolo, Waived  Date Value Ref Range Status  05/08/2019 26 11 - 38 U/L Final         Passed - ALT in normal range and within 360 days    ALT  Date Value Ref Range Status  07/01/2024 22 0 - 32 IU/L Final   ALT (SGPT) Piccolo, Waived  Date Value Ref Range Status  05/08/2019 29 10 - 47 U/L Final         Passed - Completed PHQ-2 or PHQ-9 in the last 360 days      Passed - Valid encounter within last 6 months    Recent Outpatient Visits           2 months ago Type 2 diabetes mellitus with hyperglycemia, with long-term current use of insulin  (HCC)   [place] [facility] [place], [place] T, NP   5 months ago Type 2 diabetes mellitus with hyperglycemia, with long-term current use of insulin  (HCC)   [place] [facility] [place], Melanie T, NP   7 months ago Allergic reaction, initial encounter   [place] [facility] Melvin Pao, NP   9 months ago Type 2 diabetes mellitus with hyperglycemia, with long-term current use of insulin  [facility])   [place] [facility] Valerio Melanie DASEN, NP       Future Appointments             In 1 month Hester Alm BROCKS, MD  [facility]   In 3 months Croitoru, Jerel, MD [facility] at [facility] A Dept of The [place]. [facility], H[facility]             clopidogrel  (PLAVIX ) 75 MG tablet [Pharmacy Med Name: CLOPIDOGREL  75 MG TABLET] 90 tablet 0    Sig: TAKE 1 TABLET BY MOUTH EVERY DAY     Hematology: Antiplatelets - clopidogrel  Passed - 12/22/2024 11:04  AM      Passed - HCT in normal range and within 180 days    Hematocrit  Date Value Ref Range Status  07/01/2024 41.6 34.0 - 46.6 % Final         Passed - HGB in normal range and within 180 days    Hemoglobin  Date Value Ref Range Status  07/01/2024 13.7 11.1 - 15.9 g/dL Final         Passed - PLT in normal range and within 180 days    Platelets  Date Value Ref Range Status  07/01/2024 230 150 - 450 x10E3/uL Final         Passed - Cr in normal range and within 360 days    Creatinine  Date Value Ref Range Status  06/08/2023 131.6 20.0 - 300.0 mg/dL Final   Creatinine, Ser  Date Value Ref Range Status  10/07/2024 0.94 0.57 - 1.00 mg/dL Final         Passed - Valid encounter within last 6 months    Recent  Outpatient Visits           2 months ago Type 2 diabetes mellitus with hyperglycemia, with long-term current use of insulin  (HCC)   [place] [facility] [place], [place] T, NP   5 months ago Type 2 diabetes mellitus with hyperglycemia, with long-term current use of insulin  (HCC)   [place] [facility] [place], Melanie T, NP   7 months ago Allergic reaction, initial encounter   [place] [facility] Melvin Pao, NP   9 months ago Type 2 diabetes mellitus with hyperglycemia, with long-term current use of insulin  [facility])   [place] [facility] Valerio Melanie DASEN, NP       Future Appointments             In 1 month Hester Alm BROCKS, MD  [facility]   In 3 months Croitoru, Jerel, MD [facility] at [facility] A Dept of The [place]. [facility], H&V

## 2025-01-20 ENCOUNTER — Ambulatory Visit: Admitting: Nurse Practitioner

## 2025-02-17 ENCOUNTER — Ambulatory Visit: Admitting: Dermatology

## 2025-03-30 ENCOUNTER — Ambulatory Visit: Admitting: Cardiovascular Disease

## 2025-10-27 ENCOUNTER — Ambulatory Visit
